# Patient Record
Sex: Male | Born: 1950
Health system: Southern US, Community
[De-identification: ages and names within clinical notes are randomized; demographics above are authoritative.]

## PROBLEM LIST (undated history)

## (undated) DIAGNOSIS — J189 Pneumonia, unspecified organism: Secondary | ICD-10-CM

## (undated) DIAGNOSIS — C61 Malignant neoplasm of prostate: Secondary | ICD-10-CM

## (undated) DIAGNOSIS — I679 Cerebrovascular disease, unspecified: Secondary | ICD-10-CM

## (undated) DIAGNOSIS — N189 Chronic kidney disease, unspecified: Secondary | ICD-10-CM

## (undated) DIAGNOSIS — E785 Hyperlipidemia, unspecified: Secondary | ICD-10-CM

## (undated) DIAGNOSIS — I96 Gangrene, not elsewhere classified: Secondary | ICD-10-CM

## (undated) DIAGNOSIS — I10 Essential (primary) hypertension: Secondary | ICD-10-CM

## (undated) DIAGNOSIS — E11319 Type 2 diabetes mellitus with unspecified diabetic retinopathy without macular edema: Secondary | ICD-10-CM

## (undated) DIAGNOSIS — R0989 Other specified symptoms and signs involving the circulatory and respiratory systems: Secondary | ICD-10-CM

## (undated) DIAGNOSIS — M1990 Unspecified osteoarthritis, unspecified site: Secondary | ICD-10-CM

## (undated) DIAGNOSIS — I219 Acute myocardial infarction, unspecified: Secondary | ICD-10-CM

## (undated) DIAGNOSIS — G4730 Sleep apnea, unspecified: Secondary | ICD-10-CM

## (undated) HISTORY — DX: Gangrene, not elsewhere classified: I96

## (undated) HISTORY — DX: Type 2 diabetes mellitus with unspecified diabetic retinopathy without macular edema: E11.319

## (undated) HISTORY — DX: Cerebrovascular disease, unspecified: I67.9

## (undated) HISTORY — DX: Essential (primary) hypertension: I10

## (undated) HISTORY — DX: Hyperlipidemia, unspecified: E78.5

## (undated) SURGERY — Surgical Case
Anesthesia: *Unknown

---

## 2001-09-10 ENCOUNTER — Encounter: Payer: Self-pay | Admitting: Ophthalmology

## 2001-09-10 ENCOUNTER — Ambulatory Visit (HOSPITAL_COMMUNITY): Admission: RE | Admit: 2001-09-10 | Discharge: 2001-09-11 | Payer: Self-pay | Admitting: Ophthalmology

## 2002-02-18 ENCOUNTER — Ambulatory Visit (HOSPITAL_COMMUNITY): Admission: RE | Admit: 2002-02-18 | Discharge: 2002-02-19 | Payer: Self-pay | Admitting: Ophthalmology

## 2002-10-30 HISTORY — PX: ABOVE KNEE LEG AMPUTATION: SUR20

## 2003-02-20 ENCOUNTER — Inpatient Hospital Stay (HOSPITAL_COMMUNITY): Admission: AD | Admit: 2003-02-20 | Discharge: 2003-03-18 | Payer: Self-pay | Admitting: *Deleted

## 2003-02-22 ENCOUNTER — Encounter: Payer: Self-pay | Admitting: General Surgery

## 2003-02-23 ENCOUNTER — Encounter: Payer: Self-pay | Admitting: General Surgery

## 2003-03-02 ENCOUNTER — Encounter: Payer: Self-pay | Admitting: General Surgery

## 2003-03-02 ENCOUNTER — Encounter: Admission: RE | Admit: 2003-03-02 | Discharge: 2003-03-02 | Payer: Self-pay | Admitting: General Surgery

## 2003-03-04 ENCOUNTER — Encounter: Payer: Self-pay | Admitting: Surgery

## 2003-03-11 ENCOUNTER — Encounter: Payer: Self-pay | Admitting: Surgery

## 2003-03-12 ENCOUNTER — Encounter (INDEPENDENT_AMBULATORY_CARE_PROVIDER_SITE_OTHER): Payer: Self-pay | Admitting: *Deleted

## 2003-03-16 ENCOUNTER — Encounter: Payer: Self-pay | Admitting: Internal Medicine

## 2003-03-18 ENCOUNTER — Inpatient Hospital Stay (HOSPITAL_COMMUNITY)
Admission: RE | Admit: 2003-03-18 | Discharge: 2003-03-24 | Payer: Self-pay | Admitting: Physical Medicine & Rehabilitation

## 2003-04-02 ENCOUNTER — Encounter: Payer: Self-pay | Admitting: Surgery

## 2003-04-02 ENCOUNTER — Inpatient Hospital Stay (HOSPITAL_COMMUNITY): Admission: RE | Admit: 2003-04-02 | Discharge: 2003-04-10 | Payer: Self-pay | Admitting: Surgery

## 2003-04-06 ENCOUNTER — Encounter: Payer: Self-pay | Admitting: Surgery

## 2003-06-04 ENCOUNTER — Encounter
Admission: RE | Admit: 2003-06-04 | Discharge: 2003-09-02 | Payer: Self-pay | Admitting: Physical Medicine & Rehabilitation

## 2003-08-11 ENCOUNTER — Encounter
Admission: RE | Admit: 2003-08-11 | Discharge: 2003-11-09 | Payer: Self-pay | Admitting: Physical Medicine & Rehabilitation

## 2006-02-28 ENCOUNTER — Encounter: Payer: Self-pay | Admitting: Surgery

## 2006-08-22 ENCOUNTER — Encounter: Admission: RE | Admit: 2006-08-22 | Discharge: 2006-08-22 | Payer: Self-pay | Admitting: Nephrology

## 2008-04-15 ENCOUNTER — Encounter: Admission: RE | Admit: 2008-04-15 | Discharge: 2008-06-25 | Payer: Self-pay | Admitting: Family Medicine

## 2008-07-02 ENCOUNTER — Ambulatory Visit: Payer: Self-pay | Admitting: Cardiology

## 2008-07-10 ENCOUNTER — Ambulatory Visit: Payer: Self-pay

## 2008-07-16 ENCOUNTER — Ambulatory Visit: Payer: Self-pay

## 2008-08-07 ENCOUNTER — Ambulatory Visit (HOSPITAL_BASED_OUTPATIENT_CLINIC_OR_DEPARTMENT_OTHER): Admission: RE | Admit: 2008-08-07 | Discharge: 2008-08-07 | Payer: Self-pay | Admitting: Cardiology

## 2008-08-19 ENCOUNTER — Ambulatory Visit: Payer: Self-pay | Admitting: Cardiology

## 2008-08-22 ENCOUNTER — Ambulatory Visit: Payer: Self-pay | Admitting: Pulmonary Disease

## 2009-04-15 ENCOUNTER — Encounter (INDEPENDENT_AMBULATORY_CARE_PROVIDER_SITE_OTHER): Payer: Self-pay | Admitting: *Deleted

## 2009-10-30 HISTORY — PX: COLONOSCOPY: SHX174

## 2010-06-29 ENCOUNTER — Encounter (INDEPENDENT_AMBULATORY_CARE_PROVIDER_SITE_OTHER): Payer: Self-pay | Admitting: *Deleted

## 2010-07-01 ENCOUNTER — Encounter (INDEPENDENT_AMBULATORY_CARE_PROVIDER_SITE_OTHER): Payer: Self-pay

## 2010-07-05 ENCOUNTER — Encounter (INDEPENDENT_AMBULATORY_CARE_PROVIDER_SITE_OTHER): Payer: Self-pay

## 2010-07-05 ENCOUNTER — Ambulatory Visit: Payer: Self-pay | Admitting: Gastroenterology

## 2010-07-06 ENCOUNTER — Telehealth (INDEPENDENT_AMBULATORY_CARE_PROVIDER_SITE_OTHER): Payer: Self-pay | Admitting: *Deleted

## 2010-07-15 ENCOUNTER — Ambulatory Visit: Payer: Self-pay | Admitting: Gastroenterology

## 2010-07-19 ENCOUNTER — Encounter: Payer: Self-pay | Admitting: Gastroenterology

## 2010-08-11 DIAGNOSIS — E782 Mixed hyperlipidemia: Secondary | ICD-10-CM | POA: Insufficient documentation

## 2010-08-11 DIAGNOSIS — E1159 Type 2 diabetes mellitus with other circulatory complications: Secondary | ICD-10-CM

## 2010-08-11 DIAGNOSIS — I10 Essential (primary) hypertension: Secondary | ICD-10-CM | POA: Insufficient documentation

## 2010-08-17 ENCOUNTER — Ambulatory Visit: Payer: Self-pay | Admitting: Cardiology

## 2010-08-17 DIAGNOSIS — E66811 Obesity, class 1: Secondary | ICD-10-CM | POA: Insufficient documentation

## 2010-08-17 DIAGNOSIS — E669 Obesity, unspecified: Secondary | ICD-10-CM | POA: Insufficient documentation

## 2010-08-17 DIAGNOSIS — R55 Syncope and collapse: Secondary | ICD-10-CM

## 2010-12-01 NOTE — Letter (Signed)
Summary: Pre Visit Letter Revised  [facility]  [place], [place] [address]   Phone: [phone]  Fax: [phone]        06/29/2010 MRN: [id number] [name] [place], [place]  [address]             Procedure Date:  07-15-10   Welcome to the Gastroenterology Division at [facility].    You are scheduled to see a nurse for your pre-procedure visit on 07-05-10 at 8:00a.m. on the 3rd floor at [facility], [place] [employer].  We ask that you try to arrive at our office 15 minutes prior to your appointment time to allow for check-in.  Please take a minute to review the attached form.  If you answer "Yes" to one or more of the questions on the first page, we ask that you call the person listed at your earliest opportunity.  If you answer "No" to all of the questions, please complete the rest of the form and bring it to your appointment.    Your nurse visit will consist of discussing your medical and surgical history, your immediate family medical history, and your medications.   If you are unable to list all of your medications on the form, please bring the medication bottles to your appointment and we will list them.  We will need to be aware of both prescribed and over the counter drugs.  We will need to know exact dosage information as well.    Please be prepared to read and sign documents such as consent forms, a financial agreement, and acknowledgement forms.  If necessary, and with your consent, a friend or relative is welcome to sit-in on the nurse visit with you.  Please bring your insurance card so that we may make a copy of it.  If your insurance requires a referral to see a specialist, please bring your referral form from your primary care physician.  No co-pay is required for this nurse visit.     If you cannot keep your appointment, please call [phone] to cancel or reschedule prior to your appointment date.  This allows [place] the  opportunity to schedule an appointment for another patient in need of care.    Thank you for choosing [facility] for your medical needs.  We appreciate the opportunity to care for you.  Please visit [place] at our website  to learn more about our practice.  Sincerely, The Gastroenterology Division

## 2010-12-01 NOTE — Letter (Signed)
Summary: Diabetic Instructions  [facility]  [place], [place] [address]   Phone: [phone]  Fax: [phone]    CHAMBERLAIN MARKSON [birth year] MRN: [id number]   _  _   ORAL DIABETIC MEDICATION INSTRUCTIONS  The day before your procedure:   Take your diabetic pill as you do normally  The day of your procedure:   Do not take your diabetic pill    We will check your blood sugar levels during the admission process and again in Recovery before discharging you home  ________________________________________________________________________  _  _   INSULIN (LONG ACTING) MEDICATION INSTRUCTIONS (Lantus, NPH, 70/30, Humulin, Novolin-N)   The day before your procedure:   Take  your regular evening dose    The day of your procedure:   Do not take your morning dose    _  _   INSULIN (SHORT ACTING) MEDICATION INSTRUCTIONS (Regular, Humulog, Novolog)   The day before your procedure:   Do not take your evening dose   The day of your procedure:   Do not take your morning dose   _  _   INSULIN PUMP MEDICATION INSTRUCTIONS  We will contact the physician managing your diabetic care for written dosage instructions for the day before your procedure and the day of your procedure.  Once we have received the instructions, we will contact you.

## 2010-12-01 NOTE — Letter (Signed)
Summary: Patient Notice- Polyp Results  [facility]  [address] [place], [place] [address]   Phone: [phone]  Fax: [phone]        July 19, 2010 MRN: [id number]    [name] [place], [place]  [address]    Dear [name],  I am pleased to inform you that the colon polyp(s) removed during your recent colonoscopy was (were) found to be benign (no cancer detected) upon pathologic examination.  I recommend you have a repeat colonoscopy examination in 5_ years to look for recurrent polyps, as having colon polyps increases your risk for having recurrent polyps or even colon cancer in the future.  Should you develop new or worsening symptoms of abdominal pain, bowel habit changes or bleeding from the rectum or bowels, please schedule an evaluation with either your primary care physician or with me.  Additional information/recommendations:  __ No further action with gastroenterology is needed at this time. Please      follow-up with your primary care physician for your other healthcare      needs.  __ Please call [phone] to schedule a return visit to review your      situation.  __ Please keep your follow-up visit as already scheduled.  _x_ Continue treatment plan as outlined the day of your exam.  Please call us if you are having persistent problems or have questions about your condition that have not been fully answered at this time.  Sincerely,  Inda Castle MD  This letter has been electronically signed by your physician.  Appended Document: Patient Notice- Polyp Results letter mailed

## 2010-12-01 NOTE — Letter (Signed)
Summary: [facility] Instructions  [facility]  [place], [place] [address]   Phone: [phone]  Fax: [phone]       [name]    07/25/1951    MRN: [id number]        Procedure Day /Date: Friday 07/15/2010     Arrival Time: 7:30 am      Procedure Time: 8:30 am     Location of Procedure:                    _ x_  [place] (4th Floor)                        [place]   Starting 5 days prior to your procedure Sunday 9/11  do not eat nuts, seeds, popcorn, corn, beans, peas,  salads, or any raw vegetables.  Do not take any fiber supplements (e.g. Metamucil, Citrucel, and Benefiber).  THE DAY BEFORE YOUR PROCEDURE         DATE: Thursday 9/15  1.  Drink clear liquids the entire day-NO SOLID FOOD  2.  Do not drink anything colored red or purple.  Avoid juices with pulp.  No orange juice.  3.  Drink at least 64 oz. (8 glasses) of fluid/clear liquids during the day to prevent dehydration and help the prep work efficiently.  CLEAR LIQUIDS INCLUDE: Water Jello Ice Popsicles Tea (sugar ok, no milk/cream) Powdered fruit flavored drinks Coffee (sugar ok, no milk/cream) Gatorade Juice: apple, white grape, white cranberry  Lemonade Clear bullion, consomm, broth Carbonated beverages (any kind) Strained chicken noodle soup Hard Candy                             4.  In the morning, mix first dose of MoviPrep solution:    Empty 1 Pouch A and 1 Pouch B into the disposable container    Add lukewarm drinking water to the top line of the container. Mix to dissolve    Refrigerate (mixed solution should be used within 24 hrs)  5.  Begin drinking the prep at 5:00 p.m. The MoviPrep container is divided by 4 marks.   Every 15 minutes drink the solution down to the next mark (approximately 8 oz) until the full liter is complete.   6.  Follow completed prep with 16 oz of clear liquid of your choice (Nothing red  or purple).  Continue to drink clear liquids until bedtime.  7.  Before going to bed, mix second dose of MoviPrep solution:    Empty 1 Pouch A and 1 Pouch B into the disposable container    Add lukewarm drinking water to the top line of the container. Mix to dissolve    Refrigerate  THE DAY OF YOUR PROCEDURE      DATE: Friday 9/16  Beginning at 3:30 a.m. (5 hours before procedure):         1. Every 15 minutes, drink the solution down to the next mark (approx 8 oz) until the full liter is complete.  2. Follow completed prep with 16 oz. of clear liquid of your choice.    3. You may drink clear liquids until 6:30 am (2 HOURS BEFORE PROCEDURE).   MEDICATION INSTRUCTIONS  Unless otherwise instructed, you should take regular prescription medications with a small sip of water   as early as possible the morning of  your procedure.  Diabetic patients - see separate instructions.  Additional medication instructions: Do not take Furosemide am of procedure.         OTHER INSTRUCTIONS  You will need a responsible adult at least 59 years of age to accompany you and drive you home.   This person must remain in the waiting room during your procedure.  Wear loose fitting clothing that is easily removed.  Leave jewelry and other valuables at home.  However, you may wish to bring a book to read or  an iPod/MP3 player to listen to music as you wait for your procedure to start.  Remove all body piercing jewelry and leave at home.  Total time from sign-in until discharge is approximately 2-3 hours.  You should go home directly after your procedure and rest.  You can resume normal activities the  day after your procedure.  The day of your procedure you should not:   Drive   Make legal decisions   Operate machinery   Drink alcohol   Return to work  You will receive specific instructions about eating, activities and medications before you leave.    The above instructions have  been reviewed and explained to me by   Cornelia Copa RN  July 05, 2010 7:58 am    I fully understand and can verbalize these instructions _____________________________ Date _________

## 2010-12-01 NOTE — Progress Notes (Signed)
Summary: speak to nurse  Phone Note Call from Patient Call back at Home Phone [phone]   Caller: Patient Call For: Dr Deatra Ina Reason for Call: Talk to Nurse Summary of Call: Patient wants to speak to pv nurse Initial call taken by: Ronalee Red,  July 06, 2010 4:07 PM  Follow-up for Phone Call        Pt was asked to call Pv to talke about taking Pletal.  Told pt it was okay for him to continue Pletal. Follow-up by: Ulice Dash RN,  July 06, 2010 4:24 PM

## 2010-12-01 NOTE — Procedures (Signed)
Summary: Colonoscopy  Patient: [name] Note: All result statuses are Final unless otherwise noted.  Tests: (1) Colonoscopy (COL)   COL Colonoscopy           [facility]     [place], [place]  [address]           COLONOSCOPY PROCEDURE REPORT           PATIENT:  Tom, Bradshaw  MR#:  [id number]     BIRTHDATE:  [birth year], 46 yrs. old  GENDER:  male           ENDOSCOPIST:  [name]. Deatra Ina, MD     Referred by:           PROCEDURE DATE:  07/15/2010     PROCEDURE:  Colonoscopy with snare polypectomy     ASA CLASS:  Class II     INDICATIONS:  1) Routine Risk Screening           MEDICATIONS:   Fentanyl 75 mcg IV, Versed 7 mg IV           DESCRIPTION OF PROCEDURE:   After the risks benefits and     alternatives of the procedure were thoroughly explained, informed     consent was obtained.  Digital rectal exam was performed and     revealed no abnormalities.   The LB [id number] endoscope     was introduced through the anus and advanced to the cecum, which     was identified by both the appendix and ileocecal valve, without     limitations.  The quality of the prep was good, using N[place]tley.     The instrument was then slowly withdrawn as the colon was fully     examined.     <<PROCEDUREIMAGES>>           FINDINGS:  A sessile polyp was found in the descending colon. It     was 2 - 3 mm in size. Polyp was snared without cautery. Retrieval     was successful (see image13). snare polyp  A sessile polyp was     found in the sigmoid colon. It was 2 mm in size. It was found 20     cm from the point of entry. Polyp was snared without cautery.     Retrieval was successful (see image15). snare polyp  This was     otherwise a normal examination of the colon (see image1, image2,     image3, image5, image6, image8, image12, and image17).     Retroflexed views in the rectum revealed no abnormalities.    The     time to cecum =  1.50  minutes. The scope  was then withdrawn (time     =  12.25  min) from the patient and the procedure completed.           COMPLICATIONS:  None           ENDOSCOPIC IMPRESSION:     1) 2 - 3 mm sessile polyp in the descending colon     2) 2 mm sessile polyp in the sigmoid colon     3) Otherwise normal examination     RECOMMENDATIONS:     1) If the polyp(s) removed today are proven to be adenomatous     (pre-cancerous) polyps, you will need a repeat colonoscopy in 5     years. Otherwise you should continue  to follow colorectal cancer     screening guidelines for "routine risk" patients with colonoscopy     in 10 years.           REPEAT EXAM:   You will receive a letter from Dr. Deatra Bradshaw in 1-2     weeks, after reviewing the final pathology, with followup     recommendations.           ______________________________     [name] Deatra Ina, MD           CC: Tom Eon, NP           n.     Tom MaisSandy Bradshaw. Kaplan at 07/15/2010 09:25 AM           Page 2 of 3   [place], [id number]  Note: An exclamation mark (!) indicates a result that was not dispersed into the flowsheet. Document Creation Date: 07/15/2010 9:25 AM _______________________________________________________________________  (1) Order result status: Final Collection or observation date-time: 07/15/2010 09:17 Requested date-time:  Receipt date-time:  Reported date-time:  Referring Physician:   Ordering Physician: [name] [phone]) Specimen Source:  Source: [name] Order Number: [id number] Lab site:   Appended Document: Colonoscopy     Procedures Next Due Date:    Colonoscopy: 07/2015

## 2010-12-01 NOTE — Assessment & Plan Note (Signed)
Summary: [facility]   Visit Type:  Follow-up Primary Provider:  Mercie Eon, NP  CC:  syncope.  History of Present Illness: The patient presents for followup of syncope and multiple cardiovascular risk factors. His syncopal episode was in 2009. He had a stress perfusion study demonstrating a probable inferior wall motion abnormality with a mildly reduced ejection fraction. He remained with risk reduction. No further plan. Since that time he has had no further syncopal episodes, presyncope or palpitations. He denies any chest pressure, neck or arm discomfort. He denies any significant shortness of breath, PND or orthopnea. He is not particularly active though he does work up perspiration when he presents  Current Medications (verified): 1)  Humulin 70/30 70-30 % Susp (Insulin Isophane & Regular) .... As Directed 2)  Calcium 600 1500 Mg Tabs (Calcium Carbonate) .... 2 By Mouth Daily 3)  Aspirin 81 Mg  Tabs (Aspirin) .Marland Kitchen.. 1 By Mouth Daily 4)  Furosemide 80 Mg Tabs (Furosemide) .Marland Kitchen.. 1 By Mouth Daily 5)  Lisinopril 20 Mg Tabs (Lisinopril) .Marland Kitchen.. 1 By Mouth Daily 6)  Atenolol 50 Mg Tabs (Atenolol) .Marland Kitchen.. 1 By Mouth Daily 7)  Amlodipine Besylate 5 Mg Tabs (Amlodipine Besylate) .Marland Kitchen.. 1 By Mouth Daily 8)  Crestor 20 Mg Tabs (Rosuvastatin Calcium) .Marland Kitchen.. 1 By Mouth Daily 9)  Cilostazol 100 Mg Tabs (Cilostazol) .Marland Kitchen.. 1 By Mouth Two Times A Day 10)  Cozaar 100 Mg Tabs (Losartan Potassium) .Marland Kitchen.. 1 By Mouth Daily  Allergies (verified): 1)  ! * Ambien  Past History:  Past Medical History:  Pneumonia, diabetes mellitus x19 years,   hyperlipidemia x6 years, hypertension, positive sickle cell trait, eye   surgery, circumcision, left lower extremity amputation secondary to   nonhealing ulcer, apparent osteomyelitis, syncope (unclear etiology)  Review of Systems       As stated in the HPI and negative for all other systems.   Vital Signs:  Patient profile:   60 year old male Height:      72  inches Weight:      320 pounds BMI:     43.56 Pulse rate:   70 / minute Resp:     18 per minute BP sitting:   134 / 70  (right arm)  Vitals Entered By: Levora Angel, CNA (August 17, 2010 2:09 PM)  Physical Exam  General:  Well developed, well nourished, in no acute distress. Head:  normocephalic and atraumatic Neck:  Neck supple, no JVD. No masses, thyromegaly or abnormal cervical nodes. Chest Wall:  no deformities or breast masses noted Lungs:  Clear bilaterally to auscultation and percussion. Abdomen:  Bowel sounds positive; abdomen soft and non-tender without masses, organomegaly, or hernias noted. No hepatosplenomegaly, morbidly obese Msk:  Back normal, normal gait. Muscle strength and tone normal.joint redness.   Extremities:  Status post left AKA Neurologic:  Alert and oriented x 3. Skin:  Intact without lesions or rashes. Cervical Nodes:  no significant adenopathy Psych:  Normal affect.   Detailed Cardiovascular Exam  Neck    Carotids: Carotids full and equal bilaterally without bruits.      Neck Veins: Normal, no JVD.    Heart    Inspection: no deformities or lifts noted.      Palpation: normal PMI with no thrills palpable.      Auscultation: regular rate and rhythm, S1, S2 without murmurs, rubs, gallops, or clicks.    Vascular    Abdominal Aorta: no palpable masses, pulsations, or audible bruits.  Femoral Pulses: normal femoral pulses bilaterally.      Pedal Pulses: diminished right posterior tibial pulse and diminished left posterior tibial pulse.      Radial Pulses: normal radial pulses bilaterally.     EKG  Procedure date:  08/17/2010  Findings:      Sinus rhythm, rate 78, axis within normal limits, intervals within normal limits, no acute ST-T wave changes  Impression & Recommendations:  Problem # 1:  SYNCOPE (ICD-780.2) The patient had a syncopal episode for unresponsive episode 2009 but none since then.  At this time no further work up is  indicated.  Problem # 2:  CARDIOVASCULAR FUNCTION STUDY, ABNORMAL (ICD-794.30) He did have a mildly abnormal perfusion study. He has significant risk factors. At this point I will probably screen him with followup studies in the future or sooner if he has any symptoms.  He needs aggressive risk reduction.  Problem # 3:  HYPERLIPIDEMIA (ICD-272.4) I reviewed his most recent lipid profile since he had an acceptable ratio. He will continue meds as listed. Orders: EKG w/ Interpretation ([address])  Problem # 4:  OBESITY, UNSPECIFIED (ICD-278.00) He understands the need to lose weight with his morbid obesity.  Patient Instructions: 1)  Your physician recommends that you schedule a follow-up appointment in: 12 months 2)  Your physician recommends that you continue on your current medications as directed. Please refer to the Current Medication list given to you today.

## 2010-12-01 NOTE — Miscellaneous (Signed)
Summary: [facility] previsit  Clinical Lists Changes  Medications: Added new medication of MOVIPREP 100 GM  SOLR (PEG-KCL-NACL-NASULF-NA ASC-C) As per prep instructions. - Signed Rx of MOVIPREP 100 GM  SOLR (PEG-KCL-NACL-NASULF-NA ASC-C) As per prep instructions.;  #1 x 0;  Signed;  Entered by: Cornelia Copa RN;  Authorized by: Inda Castle MD;  Method used: Electronically to [name] [address], [place], [place], [place]  [address], Ph: [phone] or [phone], Fax: [phone] Allergies: Added new allergy or adverse reaction of * AMBIEN Observations: Added new observation of NKA: F (07/05/2010 7:37)    Prescriptions: MOVIPREP 100 GM  SOLR (PEG-KCL-NACL-NASULF-NA ASC-C) As per prep instructions.  #1 x 0   Entered by:   Cornelia Copa RN   Authorized by:   Inda Castle MD   Signed by:   Cornelia Copa RN on 07/05/2010   Method used:   Electronically to        [id number] (retail)       [address]       [place],   [address]       Ph: [phone] or [phone]       Fax: [phone]   RxID:   [id number]   Appended Document: Lec previsit  Dr Deatra Ina, I left a  message for pt regarding the medication Cilostazol (Pletal) 100mg  Bid . During previsit today, asked  pt why he was taking Pletal, but he did not know why. He had an MI in 2010, but no record of seeing Dr Percival Spanish since 2009.Do we need to schedule an office visit  with you or do you want the pt to stay on the Pletal. He is scheduled for a colonoscopy with you on 07/15/10.Please advise!

## 2011-01-12 LAB — GLUCOSE, CAPILLARY
Glucose-Capillary: 107 mg/dL — ABNORMAL HIGH (ref 70–99)
Glucose-Capillary: 78 mg/dL (ref 70–99)

## 2011-03-14 NOTE — Assessment & Plan Note (Signed)
[facility]                            CARDIOLOGY OFFICE NOTE   NAME:[name]                          MRN:          [id number]  DATE:08/19/2008                            DOB:          1951-04-26    PRIMARY CARE PHYSICIAN:  Mercie Eon, NP   REASON FOR PRESENTATION:  Evaluate the patient with questionable  syncope.   HISTORY OF PRESENT ILLNESS:  The patient is a pleasant 60 year old  gentleman with an episode of unresponsiveness as outlined extensively in  the July 02, 2008, note.  As a followup of that, I did send him for  a stress perfusion study, which suggested an old inferior infarct with  no peri-infarct ischemia.  His ejection fraction was 50%.  He did also  wear an event monitor, which demonstrated normal sinus rhythm.  There  were no transmitted events in 30 days.  Finally, he had a sleep study.  I do not have the interpretation of this.  However, I did get the report  and appears to be sleep apnea.   The patient says he has had no further syncopal episodes.  He has had no  presyncope.  He has had no chest discomfort, neck or arm discomfort.  He  has not had any palpitation.  He has had no shortness breath, PND, or  orthopnea.   PAST MEDICAL HISTORY:  Pneumonia, diabetes mellitus x19 years,  hyperlipidemia x6 years, hypertension, positive sickle cell trait, eye  surgery, circumcision, left lower extremity amputation secondary to  nonhealing ulcer, apparent osteomyelitis.   ALLERGIES AND INTOLERANCES:  AMBIEN.   MEDICATIONS:  1. Insulin.  2. Aspirin 325 mg daily.  3. Cozaar 100 mg daily.  4. Amlodipine 10 mg daily.  5. Lisinopril 40 mg daily.  6. Cilostazol 200 mg daily.  7. Avandia 4 mg daily.  8. Lasix 80 mg daily.  9. Atenolol 100 mg daily.  10.Vytorin 10/80.   REVIEW OF SYSTEMS:  As stated in the HPI and otherwise negative for  other systems.   PHYSICAL EXAMINATION:  GENERAL:  The patient is in no distress.  VITAL SIGNS:  Blood pressure 120/64, heart rate 80 and regular, weight  321 pounds, body mass index 43.  HEENT:  Eyes unremarkable.  Pupils equal, round, and reactive to light.  Fundi not visualized.  Oral mucosa unremarkable.  NECK:  No jugular venous distention at 45 degrees.  Carotid upstroke  brisk and symmetrical.  No bruits.  No thyromegaly.  LYMPHATICS:  No  cervical, axillary, or inguinal adenopathy.  LUNGS:  Clear to auscultation bilaterally.  BACK:  No costovertebral angle tenderness.  CHEST:  Unremarkable.  HEART:  PMI not displaced or sustained.  S1, S2 within normal limits.  No S3, no S4.  No clicks, no rubs, no murmurs.  ABDOMEN:  Morbidly  obese, positive bowel sounds normal in frequency and pitch.  No bruits,  no rebound, no guarding.  No midline pulsatile mass.  No hepatomegaly,  no splenomegaly.  SKIN:  No rashes, no nodules.  EXTREMITIES:  2+ pulses throughout.  No edema, no  cyanosis, no clubbing.  NEUROLOGICAL:  Oriented to person, place, and time.  Cranial nerves II  through XII grossly intact.  Motor grossly intact throughout.   ASSESSMENT AND PLAN:  1. Syncope.  The patient has had no further episodes of this.  I      wonder if this was somehow related to the sleep apnea.  I do not      have any evidence of dysrhythmia.  Without recurrences, I do not      think further evaluation is warranted.  However, if he has any      recurrent episodes, I need to see him back and could consider a      loop monitor.  2. Abnormal Cardiolite.  The patient may have had an old inferior      infarct.  I will assume this.  However, there is no evidence of      high-grade obstructive disease elsewhere.  He has only a mildly      reduced ejection fraction.  At this point, I think we need to      pursue aggressive risk reduction and I have reviewed this strategy      with the patient and his wife.  If there is any future symptoms      such as shortness of breath or chest discomfort,  then I will pursue      cardiac catheterization.  3. Sleep apnea.  The patient seems to have this on his study and I      will order a sleep test or I will order a sleep apnea MD      appointment.  4. Diabetes.  Given the above abnormal Cardiolite, he needs continued      aggressive management with his diabetes and I reviewed this with      him  5. Dyslipidemia per Ms. Steadman.  A goal being LDL less than 100 and      HDL greater than 40.  He has actually achieved both of these by the      last reading.  6. Obesity.  He understands the need to lose weight with diet and      exercise.  7. Followup.  I would like to see him back in 6 months or sooner if      needed.     Minus Breeding, MD, [facility]  Electronically Signed    JH/MedQ  DD: 08/19/2008  DT: 08/19/2008  Job #: [id number]   cc:   Mercie Eon, NP

## 2011-03-14 NOTE — Assessment & Plan Note (Signed)
[facility]                            CARDIOLOGY OFFICE NOTE   NAME:[name], [name]                          MRN:          [id number]  DATE:07/02/2008                            DOB:          1951-02-09    REFERRING:  Ramond Marrow. Quita Skye, Nurse Practitioner.   REASON FOR REFERRAL:  Evaluate the patient with syncope and multiple  cardiovascular risk factors.   HISTORY OF PRESENT ILLNESS:  The patient is a pleasant 60 year old  African American gentleman without a prior cardiac history, though he  has had long-standing cardiovascular risk factors.  He stated he has had  2 episodes of syncope.  One was in February.  He was sitting on a couch,  drinking.  His wife stated he stopped responding.  His eyes seemed to  roll back a little bit.  He apparently lost consciousness for 5 minutes.  There was no seizure activity.  There was no bowel or bladder  incontinence.  When he came to, he knew where he was.  He had not had  any palpitations.  He had no prodrome.  He did not have any other event  until about 3 weeks ago.  He was coming from church.  He stated he  thought the sun was very hot that afternoon and felt that it was more  intense than usual.  He went to get out of his car and actually loss  consciousness, going down to the ground.  His wife stated he was out  just very briefly and came to.  He knew where he was.  Again, there was  no seizure activity, loss of bowel or bladder.  He had had no prodrome  other than the sense of the sun being hot.  He had had no palpitations.   Occasionally, the patient will get some lightheadedness upon standing,  although this is reproducible.  Somewhat limited in his activities, as  he has had lower extremity amputation.  He denies with his walking with  a walker that he can do, any chest discomfort, neck discomfort, arm  discomfort, activity-induced nausea, vomiting, or excessive diaphoresis.  He has had no shortness of  breath, and denies any PND or orthopnea.   PAST MEDICAL HISTORY:  1. Pneumonia.  2. Diabetes mellitus x19 years.  3. Hyperlipidemia x6 years.  4. Hypertension.  5. Positive sickle cell trait.   PAST SURGICAL HISTORY:  1. Eye surgery.  2. Circumcision.  3. Left lower extremity amputation secondary to nonhealing ulcer.  4. Apparent osteomyelitis.   ALLERGIES/INTOLERANCES:  AMBIEN.   MEDICATIONS:  1. Insulin.  2. Aspirin daily.  3. Cozaar 50 mg twice a day.  4. Amlodipine daily.  5. Lisinopril 40 mg daily.  6. Cilostazol 100 mg b.i.d.  7. Avandia 4 mg daily.  8. Furosemide 80 mg daily.  9. Atenolol 100 mg daily.  10.Vytorin 10/80 daily.   SOCIAL HISTORY:  The patient is married.  He has 2 daughters.  He has  never smoked cigarettes and does not drink alcohol.  He is a Environmental education officer.   FAMILY HISTORY:  Contributory for his mother dying of diabetes  complications at age 60 with a stroke and MI.  His daughter has sickle  cell disease.   REVIEW OF SYSTEMS:  Positive for snoring, headaches, daytime somnolence,  mild right ankle edema.  Otherwise, negative for all other systems.   PHYSICAL EXAMINATION:  GENERAL:  The patient is very pleasant and in no  distress.  VITAL SIGNS:  Blood pressure 107/69, heart rate 64 and regular, and  weight 227 pounds.  HEENT:  Eyelids unremarkable; pupils equal, round, and reactive to  light; fundi, multiple laser surgical scars; oral mucosa unremarkable.  NECK:  No jugular venous distention at 45 degrees; carotid upstroke  brisk and symmetric; no bruits, no thyromegaly.  LYMPHATICS:  No cervical, axillary, or inguinal adenopathy.  LUNGS:  Clear to auscultation bilaterally.  BACK:  No costovertebral angle tenderness.  CHEST:  Unremarkable.  HEART:  PMI not displaced or sustained; S1 and S2 within normal limits;  no S3, no S4; no clicks, no rubs, no murmurs.  ABDOMEN:  Morbidly obese; positive bowel sounds, normal in frequency and  pitch; no  rebound, no guarding; unable to appreciate midline pulsatile  mass, splenomegaly, or hepatomegaly; there is no palpable liver edge or  expanded liver with percussion.  SKIN:  No rashes, no nodules.  EXTREMITIES:  2+ pulses throughout, absent dorsalis pedis and posterior  tibialis on the right; status post amputation on the left.  NEURO:  Oriented to person, place, and time; cranial nerves II through  XII grossly intact; motor grossly intact.   EKG sinus rhythm, rate 64, axis within normal limits, intervals within  normal limits, and no acute ST-T wave changes.   ASSESSMENT AND PLAN:  1. Syncope.  The etiology of the patient's syncope is not clear.  He      has an event monitor on, which he has been wearing for 3 weeks.  We      tried to see if there has been any transmitted strips, though he      has not activated it.  He has had no events while wearing it.  I am      strongly suspecting dysrhythmia, though this is a reasonable first      step in analysis.  The second episode he had may have been      orthostasis, but the first clearly was not.  Further evaluation      based on future symptoms and the results of the monitor.  Of note,      he is to see a neurologist soon.  2. Diabetes.  The patient has long-standing diabetes.  He has got a      very low functional level.  He needs an exercise stress test, but      he would need to walk on a treadmill.  Therefore, he will have an      adenosine Cardiolite.  I think, it is unlikely that he might have      an ischemic etiology to his events.  3. Sleep apnea.  The patient almost has definitely has sleep apnea.      He stops breathing his wife says.  He snores loudly.  For his other      symptoms, he will get a sleep study.  4. Hypertension.  Blood pressure is well controlled.  He will continue      the medications as listed.  5. Dyslipidemia, followed by Mercie Eon with a goal LDL less than  100 and HDL greater than 40.  6.  Obesity.  He understands the need to lose weight with diet and      exercise.  7. Followup.  We will see the patient back after the results of his      sleep study, stress test, and the event monitor.     Minus Breeding, MD, [facility]  Electronically Signed    JH/MedQ  DD: 07/02/2008  DT: 07/03/2008  Job #: [id number]   cc:   Ramond Marrow. Quita Skye, NP

## 2011-03-14 NOTE — Procedures (Signed)
[name], [name] NO.:  [id number]   MEDICAL RECORD NO.:  [id number]          PATIENT TYPE:  OUT   LOCATION:  SLEEP CENTER                 FACILITY:  [facility]   PHYSICIAN:  Kathee Delton, MD,FCCPDATE OF BIRTH:  1951-08-22   DATE OF STUDY:  08/07/2008                            NOCTURNAL POLYSOMNOGRAM   REFERRING PHYSICIAN:  Minus Breeding, MD, [facility]   REFERRING PHYSICIAN:  Minus Breeding, MD, [facility]   INDICATIONS FOR THE STUDY:  Hypersomnia with sleep apnea.   EPWORTH SCORE:  Epworth score is 6.   SLEEP ARCHITECTURE:  The patient had a total sleep time of 309 minutes  with no slow-wave sleep and very small quantities of REM.  Sleep onset  latency was normal, and REM onset was mildly prolonged.  Sleep  efficiency was poor at 78%.   RESPIRATORY DATA:  The patient was found to have 293 obstructive apneas,  37 central apneas, and 46 hypopneas for a AHI of 73 events per hour. The  events were not positional but they were clearly worse during REM.  The  patient did not undergo split-night criteria secondary to the sleep  technician determining there was not enough total sleep time before 2  a.m.  Very loud snoring was noted throughout.   OXYGEN DATA:  The patient was found to have oxygen saturation as low as  61% with his obstructive events.   CARDIAC DATA:  No clinically significant arrhythmias were noted.   MOVEMENT/PARASOMNIA:  The patient was found to have no significant leg  jerks or abnormal behaviors.   IMPRESSION/RECOMMENDATION:  1. Severe obstructive sleep apnea/hypopnea syndrome with an apnea-      hypopnea index of 73 events per hour and O2 desaturation as low as      61%.  Treatment for this degree of sleep apnea should focus      primarily on weight loss as well as continuous positive airway      pressure.     Kathee Delton, MD,FCCP  Diplomate, [place] Board of Sleep  Medicine  Electronically Signed    [facility]/MEDQ  D:  08/22/2008  16:59:54  T:  08/23/2008 03:48:45  Job:  [id number]

## 2011-03-17 NOTE — Op Note (Signed)
NAMEKARRY, GAEBLER                               ACCOUNT NO.:  [id number]   MEDICAL RECORD NO.:  [id number]                   PATIENT TYPE:  INP   LOCATION:  [id number]                                 FACILITY:  [facility]   PHYSICIAN:  Epifania Gore. Nils Pyle, M.D.             DATE OF BIRTH:  1951-01-23   DATE OF PROCEDURE:  04/03/2003  DATE OF DISCHARGE:                                 OPERATIVE REPORT   PREOPERATIVE DIAGNOSIS:  Nonhealing below-the-knee amputation stump.   POSTOPERATIVE DIAGNOSIS:  Nonhealing below-the-knee amputation stump.   OPERATION:  Revision of the below-the-knee amputation stump to an above-the  knee amputation.   SURGEON:  Epifania Gore. Nils Pyle, M.D.   INDICATIONS:  Mr. Luba is a 60 year old hypertensive diabetic who underwent  below-the-knee amputation for progressive suppurative changes in his left  lower extremity.  The postoperative recovery has been complicated by  nonhealing with liquefaction necrosis.  We have recommended that the patient  undergo a revision of the below-the-knee amputation with a possible  conversion to an above-the-knee amputation.   TECHNIQUE:  The patient was taken to the operating room, placed on the table  in the supine position.  Anesthesia was achieved with good general  endotracheal technique.  The left lower extremity was prepped with Betadine  soap, and painted with Betadine solution, and sterile drapes were applied.  The below-the-knee amputation stump was explored.  The nonviable tissue was  debrided off, and it was obvious that the posterior muscles of gastrocnemius  and remnants of the soleus were nonviable.  We aborted our attempts at  salving the BKA and converted the operative field to an above-the-knee  amputation.  A fishmouth incision was made anteriorly to posteriorly,  extended through the layers with hemorrhage controlled with cautery.  The  femoral artery and vein were divided between [facility] clamps,  The femoral  nerve was  similarly managed.  Periosteum to the femoral shaft was  identified, scored, and elevated.  The oscillating saw was used to transect  the femoral shaft.  Specimen was forwarded to pathology.  The wound was  copiously irrigated.  All hemorrhage was controlled with ligatures of 2-0  Vicryl.  The cut edges of the femoral artery and vein were controlled with 2-  0 Vicryl ties, reinforced suture ligatures of 2-0 Vicryl.  The skin edges  were loosely approximated with #2 Prolene suture.  Sterile compressive  dressing applied.  The patient tolerated the procedure well and was  transferred from the operating room to the recovery room in satisfactory  condition with stable vital signs.                                               Harold A. Nils Pyle, M.D.    Rondel Oh  D:  04/03/2003  T:  04/05/2003  Job:  [id number]

## 2011-03-17 NOTE — H&P (Signed)
NAMEVENCENT, [name]                               ACCOUNT NO.:  [id number]   MEDICAL RECORD NO.:  [id number]                   PATIENT TYPE:  INP   LOCATION:                                       FACILITY:  [place]   PHYSICIAN:  Joneen Boers A. Nils Pyle, M.D.             DATE OF BIRTH:  [birth year]   DATE OF ADMISSION:  04/02/2003  DATE OF DISCHARGE:                                HISTORY & PHYSICAL   ADMITTING DIAGNOSIS:  Non-healing, left, below-the-knee amputation.   HISTORY OF PRESENT ILLNESS:  [name] is a 60 year old hypertensive diabetic  who was initially seen for a __________ wound in his left foot.  Patient  ultimately underwent a below-the-knee amputation, which has been complicated  by non-healing with liquefactive necrosis and drainage.  We have recommended  that he be re-admitted to the hospital with continuation of antibiotics and  prepared for revision.   PAST MEDICAL HISTORY:  Remarkable for having his diabetes.   PAST SURGICAL HISTORY:  He had no surgery.   ALLERGIES:  Reported no allergies.   FAMILY HISTORY:  Positive for hypertension, heart attacks and stroke.   REVIEW OF SYSTEMS:  Specifically negative for angina pectoris.   CURRENT MEDICATIONS:  1. Include aspirin 325 mg a day.  2. Zocor 80 mg a day.  3. Cozaar 50 mg a day.  4. Hydrochlorothiazide 25 mg a day.  5. Novolin insulin on a sliding scale.  6. Glucophage 1 gm b.i.d.  7. Insulin 70/30, 40 units in the morning, 30 units in the evening.  8. He had been on Augmentin 1 gm a day.   PHYSICAL EXAMINATION:  GENERAL:  He has a flat affect.  VITAL SIGNS:  His blood pressure is 96/60, pulse rate of 120; temperature is  98.7.  HEENT:  Clear.  NECK:  Supple.  Trachea is midline.  Thyroid is nonpalpable.  LUNGS:  Clear.  HEART:  The heart sounds are distant.  ABDOMEN:  Soft.  EXTREMITIES:  The extremity exam is remarkable for nonpalpable pulses in the  right foot.  There are no ulcerations in the right foot.   There is trace  edema.  The left lower extremity:  There is a below-the-knee amputation  stump, which is shaggy at its edges and has demonstrable liquefactive  necrosis in the mid portion with drainage.  There is no particular malodor.  There is no ascending cellulitis.  NEUROLOGIC:  The patient is grossly intact with no dominant skin lesions.  There is no regional adenopathy.   LABORATORY DATA:  In progress and will include a CBC, a type and crossmatch.   PLAN:  We plan to revise the below-the-knee amputation.  The patient will  likely will require an above-the-knee revision.  Harold A. Nils Pyle, M.D.    Rondel Oh  D:  04/02/2003  T:  04/02/2003  Job:  [id number]

## 2011-03-17 NOTE — Procedures (Signed)
[name], [name]                               ACCOUNT NO.:  [id number]   MEDICAL RECORD NO.:  [id number]                   PATIENT TYPE:  INP   LOCATION:  [id number]                                 FACILITY:  [facility]   PHYSICIAN:  Odette Fraction, M.D.                 DATE OF BIRTH:  Feb 16, 1951   DATE OF PROCEDURE:  02/25/2003  DATE OF DISCHARGE:                                  ECHOCARDIOGRAM   REFERRING PHYSICIAN:  Dr. Tamala Julian.   PROCEDURE:  Echocardiogram.   INDICATIONS FOR PROCEDURE:  [name] is a 60 year old gentleman with  diabetes mellitus, hypertension, and cellulitis who is referred for  evaluation of left ventricular function status post surgery.   SUMMARY/RESULTS:  The technical quality of the study is reasonable.   The aorta is within normal limits at 3.5 cm.   The left atrium is within normal limits at 3.6 cm. No obvious clots or  masses were appreciated and the patient appeared to be in sinus rhythm  during this procedure.   Interventricular septum was mild to moderately thickened at 1.5 cm while the  posterior wall was within normal limits at 1.1 cm.   The aortic valve appeared grossly structurally normal with good leaflet  excursion. No aortic insufficiency was noted. Doppler integrity of the  aortic valve was within normal limits.   The mitral valve was also reasonably structurally normal. No mitral valve  prolapse was noted. No mitral annular calcification was noted. Leaflet  excursion was normal. No significant mitral regurgitation was noted.   Pulmonic valve was not well visualized.   The tricuspid valve appeared grossly structurally normal with trivial  tricuspid regurgitation noted.   The left ventricle was normal in size with the LVIDD measured at 4.2 cm and  the LVID measured at 3.2 cm. Overall left ventricular systolic function  appeared to be preserved. The inferior wall did appear to be moderate  hypokinetic, as compared to the remaining walls but  overall, ejection  fraction is preserved.   The right atrium and right ventricle appeared normal in size. There was no  evidence on this study for diastolic dysfunction.   IMPRESSION:  1. Mild to moderate asymmetric septal hypertrophy.  2. Trivial tricuspid regurgitation.  3.     Normal left ventricular chamber size with preserved overall ejection     fraction.  4. The inferior wall appeared moderately hypokinetic as compared to the     remaining walls.  5. There is no evidence for diastolic dysfunction.                                               Odette Fraction, M.D.    TB/MEDQ  D:  02/25/2003  T:  02/25/2003  Job:  [id number]   cc:   Merian Capron  [address]  [place]  [place] [address]  Fax: [phone]

## 2011-03-17 NOTE — Consult Note (Signed)
NAMECOLESON, SHOPE                               ACCOUNT NO.:  [id number]   MEDICAL RECORD NO.:  [id number]                   PATIENT TYPE:  INP   LOCATION:  A318                                 FACILITY:  [facility]   PHYSICIAN:  Leane Para C. Tamala Julian, M.D.                DATE OF BIRTH:  [birth year]   DATE OF CONSULTATION:  DATE OF DISCHARGE:                                   CONSULTATION   HISTORY:  A 60 year old male with known diabetes mellitus and hypertension  who presents with less than a week history of new onset of a cut over the  fifth metatarsal of his left foot.  Over the next 72 hours the area became  black and erythematous and the patient developed chills and fever.  He  developed foot and calf pain and noticed that his diabetes was poorly  controlled.  The patient was admitted on 04/23, in the late evening, with  the evidence of early gangrenous changes of the left fifth metatarsal area.  He has been started on Ancef, Flagyl, and Levaquin, but the wife and the  patient state that the erythematous area is expanding.  I have been asked to  consult to determine whether or not the patient needs further surgical  debridement.   PAST MEDICAL HISTORY:  Past history is positive for diabetes mellitus,  hypertension, sickle cell trait, degenerative joint disease, glaucoma, and  obesity.   MEDICATIONS:  Medications are well outlined in the admission note by Dr.  Hillery Jacks.   PHYSICAL EXAMINATION:  GENERAL:  On examination he is a pleasant male who at  time the of evaluation is having some shaking chills.  VITAL SIGNS:  Blood pressure is 160/100; pulse is 110; respirations 18; and  temperature is 100.5.  HEAD AND NECK:  Exam is unremarkable.  LUNGS:  Clear.  HEART:  Regular with tachycardia.  ABDOMEN:  Abdomen is soft.  EXTREMITIES:  Extremity exam reveals decreased pulses bilaterally with  poorly detectable pulses on the left lower extremity.  There is a blackened  area over the lateral  aspect of the fifth metatarsal slightly proximal to  the head of the metatarsal.  There is a blackened area that extends across  the dorsum of the foot and there is an area of erythema that extends halfway  over the dorsum of the foot and extends up to an area a few centimeters from  the bend of the ankle anteriorly and dorsally.  There is tenderness of the  foot to palpation even in the areas that do not have any erythema.  There is  no fluctuance noted and there is no drainage of the ulcer.  There is  tenderness of the calf of the foot and there is slight edema of the foot.  The right foot has a palpable dorsalis pedis and posterior tibial pulse with  no tenderness and minimal  edema.   IMPRESSION:  1. Peripheral vascular disease in combination with diabetes mellitus with     developing gangrenous changes at the metatarsal of the left fifth toe.     He has cellulitis extending from this area.  2. Diabetes mellitus.  3. Hypertension.   RECOMMENDATIONS:  The patient will have the Ancef changed to Zosyn just to  get a more potent antibiotic on board.  This will in no way preclude him  having to have the treatment, however.  I will need to get Doppler studies  just to see what his Doppler pressures are.  It is likely that his pressures  are significantly decreased on the left and may lead to poor healing.  The  patient is started on Pletal and should be continued on this  postoperatively.  It is unlikely that it will offer anything before  debridement is done, however.  He can continue on his aspirin.  Plain film x-  rays will be done to rule out osteomyelitis and rule out air in this  subcutaneous tissue or the joint space.  I tentatively will schedule him for  wide excision of the ischemic area on Tuesday after we have gotten the other  studies done. If his cellulitis appears to be rapidly progressive, I will do  him late tomorrow afternoon.                                                Vernon Prey. Tamala Julian, M.D.    LCS/MEDQ  D:  02/22/2003  T:  02/22/2003  Job:  [id number]   cc:   Vanetta Mulders. Dechurch, M.D.  [address]  [place] [address]  Fax: [place]

## 2011-03-17 NOTE — Group Therapy Note (Signed)
   [name], [name]                               ACCOUNT NO.:  [id number]   MEDICAL RECORD NO.:  [id number]                   PATIENT TYPE:  INP   LOCATION:  A318                                 FACILITY:  [facility]   PHYSICIAN:  Leane Para C. Tamala Julian, M.D.                DATE OF BIRTH:  1950/11/19   DATE OF PROCEDURE:  DATE OF DISCHARGE:                                   PROGRESS NOTE   SUBJECTIVE:  The patient states that he feels poorly.  He had a temperature  elevation last evening to 102.  He continued to have increasing pain in his  left foot.  There is a suggestion that there is progression of the erythema  in the superficial necrosis.  There is a small amount of bloody drainage  from the foot.  He had x-rays of his foot last evening, but the results of  that have not yet returned.  Will follow that up later today.  He will also  have ankle-brachial index with arterial Dopplers today.  The patient is  scheduled to have wide debridement of the area tomorrow, and he and his wife  were informed that we may have to amputate the fifth toe.  The patient will  be continued on his present antibiotic treatment.                                               Vernon Prey. Tamala Julian, M.D.    LCS/MEDQ  D:  02/23/2003  T:  02/23/2003  Job:  [id number]

## 2011-03-17 NOTE — Discharge Summary (Signed)
   [name], [name]                               ACCOUNT NO.:  [id number]   MEDICAL RECORD NO.:  [id number]                   PATIENT TYPE:  INP   LOCATION:                                       FACILITY:  [facility]   PHYSICIAN:  Joneen Boers A. Nils Pyle, M.D.             DATE OF BIRTH:  1950-12-21   DATE OF ADMISSION:  02/20/2003  DATE OF DISCHARGE:  03/18/2003                                 DISCHARGE SUMMARY   ADMITTING DIAGNOSES:  Suppurative gangrene of the left lower extremity.   OPERATION:  Left below the knee amputation.   COMPLICATIONS:  Failure to heal with revision to a left above the knee  amputation.   HISTORY OF PRESENT ILLNESS:  Well documented in the chart.  Briefly, the  patient is a 60 year old diabetic who has been in poor control who presented  with a neurotrophic ulcer due to the NSAID properties of his lower  extremity.  He was started on antibiotics.  This showed no significant  improvement.  He manifested evidence of a septic response.  He was accepted  in transfer from [facility] and underwent an exploration for  possible limb salvage following debridement.  This was unsuccessful.   He subsequently underwent an above the knee amputation with open wound  management.  At the time of this dictation the patient is adequately  convalesced and was transferred to the rehabilitation service for assistance  in making the transition from home in the post amputation state.  The  patient was transferred during this admission to the in-house rehabilitation  service.  He will continue on his admitting medications.  His diabetic  management will be coordinated in-house by Nolene Ebbs, M.D.                                                Harold A. Nils Pyle, M.D.    Rondel Oh  D:  08/12/2003  T:  08/12/2003  Job:  [id number]

## 2011-04-11 ENCOUNTER — Encounter: Payer: Self-pay | Admitting: Nurse Practitioner

## 2012-08-22 ENCOUNTER — Encounter (HOSPITAL_COMMUNITY): Payer: Self-pay

## 2012-08-28 NOTE — Patient Instructions (Addendum)
Your procedure is scheduled on: 09/05/2012  Report to [facility] at   700       AM.  Call this number if you have problems the morning of surgery: [phone]   Do not eat food or drink liquids :After Midnight.      Take these medicines the morning of surgery with A SIP OF WATER: atenolol,lisinopril,cozaar. No diabetic meds the morning of your surgery.   Do not wear jewelry, make-up or nail polish.  Do not wear lotions, powders, or perfumes. You may wear deodorant.  Do not shave 48 hours prior to surgery.  Do not bring valuables to the hospital.  Contacts, dentures or bridgework may not be worn into surgery.  Leave suitcase in the car. After surgery it may be brought to your room.  For patients admitted to the hospital, checkout time is 11:00 AM the day of discharge.   Patients discharged the day of surgery will not be allowed to drive home.  :     Please read over the following fact sheets that you were given: Coughing and Deep Breathing, Surgical Site Infection Prevention, Anesthesia Post-op Instructions and Care and Recovery After Surgery    Cataract A cataract is a clouding of the lens of the eye. When a lens becomes cloudy, vision is reduced based on the degree and nature of the clouding. Many cataracts reduce vision to some degree. Some cataracts make people more near-sighted as they develop. Other cataracts increase glare. Cataracts that are ignored and become worse can sometimes look white. The white color can be seen through the pupil. CAUSES   Aging. However, cataracts may occur at any age, even in newborns.   Certain drugs.   Trauma to the eye.   Certain diseases such as diabetes.   Specific eye diseases such as chronic inflammation inside the eye or a sudden attack of a rare form of glaucoma.   Inherited or acquired medical problems.  SYMPTOMS   Gradual, progressive drop in vision in the affected eye.   Severe, rapid visual loss. This most often happens when trauma  is the cause.  DIAGNOSIS  To detect a cataract, an eye doctor examines the lens. Cataracts are best diagnosed with an exam of the eyes with the pupils enlarged (dilated) by drops.  TREATMENT  For an early cataract, vision may improve by using different eyeglasses or stronger lighting. If that does not help your vision, surgery is the only effective treatment. A cataract needs to be surgically removed when vision loss interferes with your everyday activities, such as driving, reading, or watching TV. A cataract may also have to be removed if it prevents examination or treatment of another eye problem. Surgery removes the cloudy lens and usually replaces it with a substitute lens (intraocular lens, IOL).  At a time when both you and your doctor agree, the cataract will be surgically removed. If you have cataracts in both eyes, only one is usually removed at a time. This allows the operated eye to heal and be out of danger from any possible problems after surgery (such as infection or poor wound healing). In rare cases, a cataract may be doing damage to your eye. In these cases, your caregiver may advise surgical removal right away. The vast majority of people who have cataract surgery have better vision afterward. HOME CARE INSTRUCTIONS  If you are not planning surgery, you may be asked to do the following:  Use different eyeglasses.   Use stronger or  brighter lighting.   Ask your eye doctor about reducing your medicine dose or changing medicines if it is thought that a medicine caused your cataract. Changing medicines does not make the cataract go away on its own.   Become familiar with your surroundings. Poor vision can lead to injury. Avoid bumping into things on the affected side. You are at a higher risk for tripping or falling.   Exercise extreme care when driving or operating machinery.   Wear sunglasses if you are sensitive to bright light or experiencing problems with glare.  SEEK  IMMEDIATE MEDICAL CARE IF:   You have a worsening or sudden vision loss.   You notice redness, swelling, or increasing pain in the eye.   You have a fever.  Document Released: 10/16/2005 Document Revised: 10/05/2011 Document Reviewed: 06/09/2011 [facility] Patient Information 2012 [place].PATIENT INSTRUCTIONS POST-ANESTHESIA  IMMEDIATELY FOLLOWING SURGERY:  Do not drive or operate machinery for the first twenty four hours after surgery.  Do not make any important decisions for twenty four hours after surgery or while taking narcotic pain medications or sedatives.  If you develop intractable nausea and vomiting or a severe headache please notify your doctor immediately.  FOLLOW-UP:  Please make an appointment with your surgeon as instructed. You do not need to follow up with anesthesia unless specifically instructed to do so.  WOUND CARE INSTRUCTIONS (if applicable):  Keep a dry clean dressing on the anesthesia/puncture wound site if there is drainage.  Once the wound has quit draining you may leave it open to air.  Generally you should leave the bandage intact for twenty four hours unless there is drainage.  If the epidural site drains for more than 36-48 hours please call the anesthesia department.  QUESTIONS?:  Please feel free to call your physician or the hospital operator if you have any questions, and they will be happy to assist you.

## 2012-08-29 ENCOUNTER — Encounter (HOSPITAL_COMMUNITY): Payer: Self-pay

## 2012-08-29 ENCOUNTER — Encounter (HOSPITAL_COMMUNITY)
Admission: RE | Admit: 2012-08-29 | Discharge: 2012-08-29 | Disposition: A | Discharge status: Home / Self Care | Payer: PRIVATE HEALTH INSURANCE | Source: Ambulatory Visit | Attending: Ophthalmology | Admitting: Ophthalmology

## 2012-08-29 ENCOUNTER — Other Ambulatory Visit: Payer: Self-pay

## 2012-08-29 LAB — HEMOGLOBIN AND HEMATOCRIT, BLOOD
HCT: 35.7 % — ABNORMAL LOW (ref 39.0–52.0)
Hemoglobin: 12.3 g/dL — ABNORMAL LOW (ref 13.0–17.0)

## 2012-08-29 LAB — BASIC METABOLIC PANEL
CO2: 28 mEq/L (ref 19–32)
Chloride: 106 mEq/L (ref 96–112)
Sodium: 139 mEq/L (ref 135–145)

## 2012-09-04 MED ORDER — CYCLOPENTOLATE-PHENYLEPHRINE 0.2-1 % OP SOLN
OPHTHALMIC | Status: AC
  Filled 2012-09-04: qty 2

## 2012-09-04 MED ORDER — TETRACAINE HCL 0.5 % OP SOLN
OPHTHALMIC | Status: AC
  Filled 2012-09-04: qty 2

## 2012-09-04 MED ORDER — PHENYLEPHRINE HCL 2.5 % OP SOLN
OPHTHALMIC | Status: AC
  Filled 2012-09-04: qty 2

## 2012-09-04 MED ORDER — LIDOCAINE HCL 3.5 % OP GEL
OPHTHALMIC | Status: AC
  Filled 2012-09-04: qty 5

## 2012-09-04 MED ORDER — NEOMYCIN-POLYMYXIN-DEXAMETH 3.5-10000-0.1 OP OINT
TOPICAL_OINTMENT | OPHTHALMIC | Status: AC
  Filled 2012-09-04: qty 3.5

## 2012-09-04 MED ORDER — LIDOCAINE HCL (PF) 1 % IJ SOLN
INTRAMUSCULAR | Status: AC
  Filled 2012-09-04: qty 2

## 2012-09-05 ENCOUNTER — Encounter (HOSPITAL_COMMUNITY): Payer: Self-pay | Admitting: Anesthesiology

## 2012-09-05 ENCOUNTER — Encounter (HOSPITAL_COMMUNITY): Payer: Self-pay | Admitting: *Deleted

## 2012-09-05 ENCOUNTER — Ambulatory Visit (HOSPITAL_COMMUNITY)
Admission: RE | Admit: 2012-09-05 | Discharge: 2012-09-05 | Disposition: A | Discharge status: Home / Self Care | Payer: PRIVATE HEALTH INSURANCE | Source: Ambulatory Visit | Attending: Ophthalmology | Admitting: Ophthalmology

## 2012-09-05 ENCOUNTER — Ambulatory Visit (HOSPITAL_COMMUNITY): Payer: PRIVATE HEALTH INSURANCE | Admitting: Anesthesiology

## 2012-09-05 ENCOUNTER — Encounter (HOSPITAL_COMMUNITY)
Admission: RE | Disposition: A | Discharge status: Home / Self Care | Payer: Self-pay | Source: Ambulatory Visit | Attending: Ophthalmology

## 2012-09-05 DIAGNOSIS — I10 Essential (primary) hypertension: Secondary | ICD-10-CM | POA: Insufficient documentation

## 2012-09-05 DIAGNOSIS — H2589 Other age-related cataract: Secondary | ICD-10-CM | POA: Insufficient documentation

## 2012-09-05 DIAGNOSIS — H2181 Floppy iris syndrome: Secondary | ICD-10-CM | POA: Insufficient documentation

## 2012-09-05 DIAGNOSIS — E119 Type 2 diabetes mellitus without complications: Secondary | ICD-10-CM | POA: Insufficient documentation

## 2012-09-05 DIAGNOSIS — Z01812 Encounter for preprocedural laboratory examination: Secondary | ICD-10-CM | POA: Insufficient documentation

## 2012-09-05 DIAGNOSIS — Z01810 Encounter for preprocedural cardiovascular examination: Secondary | ICD-10-CM | POA: Insufficient documentation

## 2012-09-05 HISTORY — PX: CATARACT EXTRACTION W/PHACO: SHX586

## 2012-09-05 SURGERY — PHACOEMULSIFICATION, CATARACT, WITH IOL INSERTION
Anesthesia: Monitor Anesthesia Care | Site: Eye | Laterality: Left | Wound class: Clean

## 2012-09-05 MED ORDER — FENTANYL CITRATE 0.05 MG/ML IJ SOLN: 25.0000 ug | INTRAMUSCULAR | Status: DC | PRN

## 2012-09-05 MED ORDER — MIDAZOLAM HCL 2 MG/2ML IJ SOLN
1.0000 mg | INTRAMUSCULAR | Status: DC | PRN
  Administered 2012-09-05: 2 mg via INTRAVENOUS

## 2012-09-05 MED ORDER — EPINEPHRINE HCL 1 MG/ML IJ SOLN
INTRAMUSCULAR | Status: AC
  Filled 2012-09-05: qty 1

## 2012-09-05 MED ORDER — POVIDONE-IODINE 5 % OP SOLN
OPHTHALMIC | Status: DC | PRN
  Administered 2012-09-05: 1 via OPHTHALMIC

## 2012-09-05 MED ORDER — BSS IO SOLN
INTRAOCULAR | Status: DC | PRN
  Administered 2012-09-05: 15 mL via INTRAOCULAR

## 2012-09-05 MED ORDER — LIDOCAINE HCL 3.5 % OP GEL
1.0000 "application " | Freq: Once | OPHTHALMIC | Status: AC
  Administered 2012-09-05: 1 via OPHTHALMIC

## 2012-09-05 MED ORDER — CYCLOPENTOLATE HCL 1 % OP SOLN: 1.0000 [drp] | OPHTHALMIC | Status: AC

## 2012-09-05 MED ORDER — LACTATED RINGERS IV SOLN
INTRAVENOUS | Status: DC | PRN
  Administered 2012-09-05: 08:00:00 via INTRAVENOUS

## 2012-09-05 MED ORDER — CYCLOPENTOLATE-PHENYLEPHRINE 0.2-1 % OP SOLN
1.0000 [drp] | OPHTHALMIC | Status: AC
  Administered 2012-09-05 (×3): 1 [drp] via OPHTHALMIC

## 2012-09-05 MED ORDER — PHENYLEPHRINE HCL 2.5 % OP SOLN
1.0000 [drp] | OPHTHALMIC | Status: AC
  Administered 2012-09-05 (×3): 1 [drp] via OPHTHALMIC

## 2012-09-05 MED ORDER — EPINEPHRINE HCL 1 MG/ML IJ SOLN
INTRAMUSCULAR | Status: AC
  Filled 2012-09-05: qty 11

## 2012-09-05 MED ORDER — TETRACAINE HCL 0.5 % OP SOLN
1.0000 [drp] | OPHTHALMIC | Status: AC
  Administered 2012-09-05 (×3): 1 [drp] via OPHTHALMIC

## 2012-09-05 MED ORDER — ONDANSETRON HCL 4 MG/2ML IJ SOLN: 4.0000 mg | Freq: Once | INTRAMUSCULAR | Status: DC | PRN

## 2012-09-05 MED ORDER — MIDAZOLAM HCL 2 MG/2ML IJ SOLN
INTRAMUSCULAR | Status: AC
  Filled 2012-09-05: qty 2

## 2012-09-05 MED ORDER — LACTATED RINGERS IV SOLN: INTRAVENOUS | Status: DC

## 2012-09-05 MED ORDER — LIDOCAINE HCL (PF) 1 % IJ SOLN
INTRAOCULAR | Status: DC | PRN
  Administered 2012-09-05: 09:00:00 via OPHTHALMIC

## 2012-09-05 MED ORDER — EPINEPHRINE HCL 1 MG/ML IJ SOLN
INTRAOCULAR | Status: DC | PRN
  Administered 2012-09-05: 08:00:00

## 2012-09-05 MED ORDER — NEOMYCIN-POLYMYXIN-DEXAMETH 0.1 % OP OINT
TOPICAL_OINTMENT | OPHTHALMIC | Status: DC | PRN
  Administered 2012-09-05: 1 via OPHTHALMIC

## 2012-09-05 MED ORDER — PROVISC 10 MG/ML IO SOLN
INTRAOCULAR | Status: DC | PRN
  Administered 2012-09-05: 8.5 mg via INTRAOCULAR

## 2012-09-05 SURGICAL SUPPLY — 32 items
CAPSULAR TENSION RING-AMO (OPHTHALMIC RELATED) IMPLANT
CLOTH BEACON ORANGE TIMEOUT ST (SAFETY) ×1 IMPLANT
EYE SHIELD UNIVERSAL CLEAR (GAUZE/BANDAGES/DRESSINGS) ×1 IMPLANT
GLOVE BIO SURGEON STRL SZ 6.5 (GLOVE) IMPLANT
GLOVE BIOGEL PI IND STRL 6.5 (GLOVE) IMPLANT
GLOVE BIOGEL PI IND STRL 7.0 (GLOVE) IMPLANT
GLOVE BIOGEL PI IND STRL 7.5 (GLOVE) IMPLANT
GLOVE BIOGEL PI INDICATOR 6.5 (GLOVE)
GLOVE BIOGEL PI INDICATOR 7.0 (GLOVE) ×1
GLOVE BIOGEL PI INDICATOR 7.5 (GLOVE)
GLOVE ECLIPSE 6.5 STRL STRAW (GLOVE) IMPLANT
GLOVE ECLIPSE 7.0 STRL STRAW (GLOVE) IMPLANT
GLOVE ECLIPSE 7.5 STRL STRAW (GLOVE) IMPLANT
GLOVE EXAM NITRILE LRG STRL (GLOVE) IMPLANT
GLOVE EXAM NITRILE MD LF STRL (GLOVE) ×1 IMPLANT
GLOVE SKINSENSE NS SZ6.5 (GLOVE)
GLOVE SKINSENSE NS SZ7.0 (GLOVE)
GLOVE SKINSENSE STRL SZ6.5 (GLOVE) IMPLANT
GLOVE SKINSENSE STRL SZ7.0 (GLOVE) IMPLANT
KIT VITRECTOMY (OPHTHALMIC RELATED) IMPLANT
PAD ARMBOARD 7.5X6 YLW CONV (MISCELLANEOUS) ×1 IMPLANT
PROC W NO LENS (INTRAOCULAR LENS)
PROC W SPEC LENS (INTRAOCULAR LENS)
PROCESS W NO LENS (INTRAOCULAR LENS) IMPLANT
PROCESS W SPEC LENS (INTRAOCULAR LENS) IMPLANT
RING MALYGIN (MISCELLANEOUS) IMPLANT
SIGHTPATH CAT PROC W REG LENS (Ophthalmic Related) ×2 IMPLANT
SYR TB 1ML LL NO SAFETY (SYRINGE) ×1 IMPLANT
TAPE SURG TRANSPORE 1 IN (GAUZE/BANDAGES/DRESSINGS) IMPLANT
TAPE SURGICAL TRANSPORE 1 IN (GAUZE/BANDAGES/DRESSINGS) ×1
VISCOELASTIC ADDITIONAL (OPHTHALMIC RELATED) IMPLANT
WATER STERILE IRR 250ML POUR (IV SOLUTION) ×1 IMPLANT

## 2012-09-05 NOTE — H&P (Signed)
I have reviewed the H&P, the patient was re-examined, and I have identified no interval changes in medical condition and plan of care since the history and physical of record  

## 2012-09-05 NOTE — Anesthesia Postprocedure Evaluation (Signed)
  Anesthesia Post-op Note  Patient: [name]  Procedure(s) Performed: Procedure(s) (LRB) with comments: CATARACT EXTRACTION PHACO AND INTRAOCULAR LENS PLACEMENT (IOC) (Left) - CDE=5.45  Patient Location: PACU  Anesthesia Type: MAC  Level of Consciousness: awake, alert , oriented and patient cooperative  Airway and Oxygen Therapy: Patient Spontanous Breathing room air  Post-op Pain: mild  Post-op Assessment: Post-op Vital signs reviewed, Patient's Cardiovascular Status Stable, Respiratory Function Stable, Patent Airway and No signs of Nausea or vomiting  Post-op Vital Signs: Reviewed and stable  Complications: No apparent anesthesia complications

## 2012-09-05 NOTE — Anesthesia Procedure Notes (Signed)
Procedure Name: MAC Date/Time: 09/05/2012 8:27 AM Performed by: Antony Contras, Rodel Glaspy L Pre-anesthesia Checklist: Patient identified, Patient being monitored, Emergency Drugs available, Timeout performed and Suction available Oxygen Delivery Method: Nasal cannula

## 2012-09-05 NOTE — Anesthesia Preprocedure Evaluation (Signed)
Anesthesia Evaluation  Patient identified by MRN, date of birth, ID band Patient awake    Reviewed: Allergy & Precautions, H&P , NPO status , Patient's Chart, lab work & pertinent test results  Airway Mallampati: II      Dental  (+) Edentulous Upper and Partial Lower   Pulmonary  breath sounds clear to auscultation        Cardiovascular hypertension, + CAD Rhythm:Regular Rate:Normal     Neuro/Psych    GI/Hepatic   Endo/Other  diabetes, Well Controlled, Type 2, Oral Hypoglycemic Agents  Renal/GU      Musculoskeletal   Abdominal   Peds  Hematology   Anesthesia Other Findings   Reproductive/Obstetrics                           Anesthesia Physical Anesthesia Plan  ASA: III  Anesthesia Plan: MAC   Post-op Pain Management:    Induction: Intravenous  Airway Management Planned: Nasal Cannula  Additional Equipment:   Intra-op Plan:   Post-operative Plan:   Informed Consent: I have reviewed the patients History and Physical, chart, labs and discussed the procedure including the risks, benefits and alternatives for the proposed anesthesia with the patient or authorized representative who has indicated his/her understanding and acceptance.     Plan Discussed with:   Anesthesia Plan Comments:         Anesthesia Quick Evaluation

## 2012-09-05 NOTE — Brief Op Note (Signed)
Pre-Op Dx: Cataract OS Post-Op Dx: Cataract OS Surgeon: Deriyah Kunath Anesthesia: Topical with MAC Surgery: Cataract Extraction with Intraocular lens Implant OS Implant: B&L enVista Specimen: None Complications: None 

## 2012-09-05 NOTE — Transfer of Care (Signed)
  Anesthesia Post-op Note  Patient: [name]  Procedure(s) Performed: Procedure(s) (LRB) with comments: CATARACT EXTRACTION PHACO AND INTRAOCULAR LENS PLACEMENT (IOC) (Left) - CDE=5.45  Patient Location: PACU  Anesthesia Type: MAC  Level of Consciousness: awake, alert , oriented and patient cooperative  Airway and Oxygen Therapy: Patient Spontanous Breathing room air  Post-op Pain: mild  Post-op Assessment: Post-op Vital signs reviewed, Patient's Cardiovascular Status Stable, Respiratory Function Stable, Patent Airway and No signs of Nausea or vomiting  Post-op Vital Signs: Reviewed and stable  Complications: No apparent anesthesia complications

## 2012-09-05 NOTE — Preoperative (Signed)
Beta Blockers   Reason not to administer Beta Blockers:Not Applicable 

## 2012-09-06 NOTE — Op Note (Signed)
NAMESAMEUL, SCHRICK                   ACCOUNT NO.:  [id number]  MEDICAL RECORD NO.:  [id number]  LOCATION:  APPO                          FACILITY:  [facility]  PHYSICIAN:  Richardo Hanks, MD       DATE OF BIRTH:  [birth year]  DATE OF PROCEDURE:  09/05/2012 DATE OF DISCHARGE:  09/05/2012                              OPERATIVE REPORT   PREOPERATIVE DIAGNOSIS:  Combined cataract, left eye, diagnosis code 366.19.  POSTOPERATIVE DIAGNOSES:  Combined cataract, left eye, diagnosis code 366.19.  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  OPERATION PERFORMED:  Phacoemulsification with posterior chamber intraocular lens implantation, left eye.  SURGEON:  Franky Macho. Taysia Rivere, MD  ANESTHESIA:  Topical with monitored anesthesia care and IV sedation.  OPERATIVE SUMMARY:  In the preoperative area, dilating drops were placed into the left eye.  The patient was then brought into the operating room where he was placed under general anesthesia.  The eye was then prepped and draped.  Beginning with a 75 blade, a paracentesis port was made at the surgeon's 2 o'clock position.  The anterior chamber was then filled with a 1% nonpreserved lidocaine solution with epinephrine.  This was followed by Viscoat to deepen the chamber.  A small fornix-based peritomy was performed superiorly.  Next, a single iris hook was placed through the limbus superiorly.  A 2.4-mm keratome blade was then used to make a clear corneal incision over the iris hook.  A bent cystotome needle and Utrata forceps were used to create a continuous tear capsulotomy.  Hydrodissection was performed using balanced salt solution on a fine cannula.  The lens nucleus was then removed using phacoemulsification in a quadrant cracking technique.  The cortical material was then removed with irrigation and aspiration.  The capsular bag and anterior chamber were refilled with Provisc.  The wound was widened to approximately 3 mm and a posterior  chamber intraocular lens was placed into the capsular bag without difficulty using an [employer] lens injecting system.  A single 10-0 nylon suture was then used to close the incision as well as stromal hydration.  The Provisc was removed from the anterior chamber and capsular bag with irrigation and aspiration.  At this point, the wounds were tested for leak, which were negative.  The anterior chamber remained deep and stable.  The patient tolerated the procedure well.  There were no operative complications, and he awoke from general anesthesia without problem.  No surgical specimens.  Prosthetic device used is a Bausch and Lomb posterior chamber lens, model enVista, model number MX60, power of 17.5, serial number is [id number].          ______________________________ Richardo Hanks, MD     KEH/MEDQ  D:  09/05/2012  T:  09/06/2012  Job:  [id number]

## 2012-09-09 ENCOUNTER — Encounter (HOSPITAL_COMMUNITY): Payer: Self-pay | Admitting: Ophthalmology

## 2012-09-10 LAB — GLUCOSE, CAPILLARY: Glucose-Capillary: 127 mg/dL — ABNORMAL HIGH (ref 70–99)

## 2012-09-17 ENCOUNTER — Encounter (HOSPITAL_COMMUNITY): Payer: Self-pay | Admitting: Pharmacy Technician

## 2012-09-20 ENCOUNTER — Inpatient Hospital Stay (HOSPITAL_COMMUNITY): Admission: RE | Admit: 2012-09-20 | Discharge: 2012-09-20 | Payer: PRIVATE HEALTH INSURANCE | Source: Ambulatory Visit

## 2012-10-02 MED ORDER — CYCLOPENTOLATE-PHENYLEPHRINE 0.2-1 % OP SOLN
OPHTHALMIC | Status: AC
  Filled 2012-10-02: qty 2

## 2012-10-02 MED ORDER — TETRACAINE HCL 0.5 % OP SOLN
OPHTHALMIC | Status: AC
  Filled 2012-10-02: qty 2

## 2012-10-02 MED ORDER — LIDOCAINE HCL (PF) 1 % IJ SOLN
INTRAMUSCULAR | Status: AC
  Filled 2012-10-02: qty 2

## 2012-10-02 MED ORDER — PHENYLEPHRINE HCL 2.5 % OP SOLN
OPHTHALMIC | Status: AC
  Filled 2012-10-02: qty 2

## 2012-10-02 MED ORDER — NEOMYCIN-POLYMYXIN-DEXAMETH 3.5-10000-0.1 OP OINT
TOPICAL_OINTMENT | OPHTHALMIC | Status: AC
  Filled 2012-10-02: qty 3.5

## 2012-10-02 MED ORDER — LIDOCAINE HCL 3.5 % OP GEL
OPHTHALMIC | Status: AC
  Filled 2012-10-02: qty 5

## 2012-10-03 ENCOUNTER — Encounter (HOSPITAL_COMMUNITY)
Admission: RE | Disposition: A | Discharge status: Home / Self Care | Payer: Self-pay | Source: Ambulatory Visit | Attending: Ophthalmology

## 2012-10-03 ENCOUNTER — Ambulatory Visit (HOSPITAL_COMMUNITY): Payer: PRIVATE HEALTH INSURANCE | Admitting: Anesthesiology

## 2012-10-03 ENCOUNTER — Encounter (HOSPITAL_COMMUNITY): Payer: Self-pay | Admitting: *Deleted

## 2012-10-03 ENCOUNTER — Encounter (HOSPITAL_COMMUNITY): Payer: Self-pay | Admitting: Anesthesiology

## 2012-10-03 ENCOUNTER — Ambulatory Visit (HOSPITAL_COMMUNITY)
Admission: RE | Admit: 2012-10-03 | Discharge: 2012-10-03 | Disposition: A | Discharge status: Home / Self Care | Payer: PRIVATE HEALTH INSURANCE | Source: Ambulatory Visit | Attending: Ophthalmology | Admitting: Ophthalmology

## 2012-10-03 DIAGNOSIS — H269 Unspecified cataract: Secondary | ICD-10-CM | POA: Insufficient documentation

## 2012-10-03 HISTORY — PX: CATARACT EXTRACTION W/PHACO: SHX586

## 2012-10-03 LAB — GLUCOSE, CAPILLARY: Glucose-Capillary: 131 mg/dL — ABNORMAL HIGH (ref 70–99)

## 2012-10-03 SURGERY — PHACOEMULSIFICATION, CATARACT, WITH IOL INSERTION
Anesthesia: Monitor Anesthesia Care | Site: Eye | Laterality: Right | Wound class: Clean

## 2012-10-03 MED ORDER — EPINEPHRINE HCL 1 MG/ML IJ SOLN
INTRAOCULAR | Status: DC | PRN
  Administered 2012-10-03: 08:00:00

## 2012-10-03 MED ORDER — LIDOCAINE HCL 3.5 % OP GEL: 1.0000 "application " | Freq: Once | OPHTHALMIC | Status: DC

## 2012-10-03 MED ORDER — TETRACAINE HCL 0.5 % OP SOLN
1.0000 [drp] | OPHTHALMIC | Status: AC
  Administered 2012-10-03 (×3): 1 [drp] via OPHTHALMIC

## 2012-10-03 MED ORDER — CYCLOPENTOLATE-PHENYLEPHRINE 0.2-1 % OP SOLN
1.0000 [drp] | OPHTHALMIC | Status: AC
  Administered 2012-10-03 (×3): 1 [drp] via OPHTHALMIC

## 2012-10-03 MED ORDER — POVIDONE-IODINE 5 % OP SOLN
OPHTHALMIC | Status: DC | PRN
  Administered 2012-10-03: 1 via OPHTHALMIC

## 2012-10-03 MED ORDER — PHENYLEPHRINE HCL 2.5 % OP SOLN
1.0000 [drp] | OPHTHALMIC | Status: AC
  Administered 2012-10-03 (×3): 1 [drp] via OPHTHALMIC

## 2012-10-03 MED ORDER — LACTATED RINGERS IV SOLN
INTRAVENOUS | Status: DC
  Administered 2012-10-03: 1000 mL via INTRAVENOUS

## 2012-10-03 MED ORDER — MIDAZOLAM HCL 2 MG/2ML IJ SOLN
INTRAMUSCULAR | Status: AC
  Filled 2012-10-03: qty 2

## 2012-10-03 MED ORDER — EPINEPHRINE HCL 1 MG/ML IJ SOLN
INTRAMUSCULAR | Status: AC
  Filled 2012-10-03: qty 1

## 2012-10-03 MED ORDER — LIDOCAINE HCL (PF) 1 % IJ SOLN
INTRAMUSCULAR | Status: DC | PRN
  Administered 2012-10-03: .5 mL

## 2012-10-03 MED ORDER — LIDOCAINE 3.5 % OP GEL OPTIME - NO CHARGE
OPHTHALMIC | Status: DC | PRN
  Administered 2012-10-03: 2 [drp]

## 2012-10-03 MED ORDER — NEOMYCIN-POLYMYXIN-DEXAMETH 0.1 % OP OINT
TOPICAL_OINTMENT | OPHTHALMIC | Status: DC | PRN
  Administered 2012-10-03: 1 via OPHTHALMIC

## 2012-10-03 MED ORDER — BSS IO SOLN
INTRAOCULAR | Status: DC | PRN
  Administered 2012-10-03: 15 mL via INTRAOCULAR

## 2012-10-03 MED ORDER — PROVISC 10 MG/ML IO SOLN
INTRAOCULAR | Status: DC | PRN
  Administered 2012-10-03: 8.5 mg via INTRAOCULAR

## 2012-10-03 MED ORDER — MIDAZOLAM HCL 2 MG/2ML IJ SOLN
1.0000 mg | INTRAMUSCULAR | Status: DC | PRN
  Administered 2012-10-03: 2 mg via INTRAVENOUS

## 2012-10-03 SURGICAL SUPPLY — 32 items

## 2012-10-03 NOTE — Anesthesia Postprocedure Evaluation (Signed)
  Anesthesia Post-op Note  Patient: [name]  Procedure(s) Performed: Procedure(s) (LRB) with comments: CATARACT EXTRACTION PHACO AND INTRAOCULAR LENS PLACEMENT (IOC) (Right) - CDE: 12.31  Patient Location: Short Stay  Anesthesia Type:MAC  Level of Consciousness: awake, alert , oriented and patient cooperative  Airway and Oxygen Therapy: Patient Spontanous Breathing  Post-op Pain: none  Post-op Assessment: Post-op Vital signs reviewed, Patient's Cardiovascular Status Stable, Respiratory Function Stable, Patent Airway and Pain level controlled  Post-op Vital Signs: Reviewed and stable  Complications: No apparent anesthesia complications

## 2012-10-03 NOTE — Brief Op Note (Signed)
Pre-Op Dx: Cataract OD Post-Op Dx: Cataract OD Surgeon: Tonny Branch Anesthesia: Topical with MAC Surgery: Cataract Extraction with Intraocular lens Implant OD Implant: B&L enVista Specimen: None Complications: None

## 2012-10-03 NOTE — Transfer of Care (Signed)
Immediate Anesthesia Transfer of Care Note  Patient: [name]  Procedure(s) Performed: Procedure(s) (LRB) with comments: CATARACT EXTRACTION PHACO AND INTRAOCULAR LENS PLACEMENT (IOC) (Right) - CDE: 12.31  Patient Location: Short Stay  Anesthesia Type:MAC  Level of Consciousness: awake, alert , oriented and patient cooperative  Airway & Oxygen Therapy: Patient Spontanous Breathing  Post-op Assessment: Report given to PACU RN, Post -op Vital signs reviewed and stable and Patient moving all extremities  Post vital signs: Reviewed and stable  Complications: No apparent anesthesia complications

## 2012-10-03 NOTE — H&P (Signed)
I have reviewed the H&P, the patient was re-examined, and I have identified no interval changes in medical condition and plan of care since the history and physical of record  

## 2012-10-03 NOTE — Op Note (Signed)
NAMEJONANTHAN, [name] NO.:  [id number]  MEDICAL RECORD NO.:  [id number]  LOCATION:  APPO                          FACILITY:  [facility]  PHYSICIAN:  Richardo Hanks, MD       DATE OF BIRTH:  04/18/1951  DATE OF PROCEDURE:  10/03/2012 DATE OF DISCHARGE:  10/03/2012                              OPERATIVE REPORT   PREOPERATIVE DIAGNOSIS:  Combined cataract, right eye, diagnosis code 366.19.  POSTOPERATIVE DIAGNOSIS:  Combined cataract, right eye, diagnosis code 366.19.  OPERATION PERFORMED:  Phacoemulsification with posterior chamber intraocular lens implantation, right eye.  SURGEON:  Franky Macho. Leata Dominy, MD  ANESTHESIA:  Topical with monitored anesthesia care and IV sedation.  OPERATIVE SUMMARY:  In the preoperative area, dilating drops were placed into the right eye.  The patient was then brought into the operating room where he was placed under general anesthesia.  The eye was then prepped and draped.  Beginning with a 75 blade, a paracentesis port was made at the surgeon's 2 o'clock position.  The anterior chamber was then filled with a 1% nonpreserved lidocaine solution with epinephrine.  This was followed by Viscoat to deepen the chamber.  A small fornix-based peritomy was performed superiorly.  Next, a single iris hook was placed through the limbus superiorly.  A 2.4-mm keratome blade was then used to make a clear corneal incision over the iris hook.  A bent cystotome needle and Utrata forceps were used to create a continuous tear capsulotomy.  Hydrodissection was performed using balanced salt solution on a fine cannula.  The lens nucleus was then removed using phacoemulsification in a quadrant cracking technique.  The cortical material was then removed with irrigation and aspiration.  The capsular bag and anterior chamber were refilled with Provisc.  The wound was widened to approximately 3 mm and a posterior chamber intraocular lens was placed into the capsular  bag without difficulty using an [employer] lens injecting system.  A single 10-0 nylon suture was then used to close the incision as well as stromal hydration.  The Provisc was removed from the anterior chamber and capsular bag with irrigation and aspiration.  At this point, the wounds were tested for leak, which were negative.  The anterior chamber remained deep and stable.  The patient tolerated the procedure well.  There were no operative complications, and he awoke from general anesthesia without problem.  No surgical specimens.  Prosthetic device used is a [employer], model EnVista, model number MX60, power of 18.0, serial number is [id number].          ______________________________ Richardo Hanks, MD     KEH/MEDQ  D:  10/03/2012  T:  10/03/2012  Job:  [id number]

## 2012-10-03 NOTE — Anesthesia Preprocedure Evaluation (Signed)
Anesthesia Evaluation  Patient identified by MRN, date of birth, ID band Patient awake    Reviewed: Allergy & Precautions, H&P , NPO status , Patient's Chart, lab work & pertinent test results  Airway Mallampati: II      Dental  (+) Edentulous Upper and Partial Lower   Pulmonary  breath sounds clear to auscultation        Cardiovascular hypertension, + CAD Rhythm:Regular Rate:Normal     Neuro/Psych    GI/Hepatic   Endo/Other  diabetes, Well Controlled, Type 2, Oral Hypoglycemic Agents  Renal/GU      Musculoskeletal   Abdominal   Peds  Hematology   Anesthesia Other Findings   Reproductive/Obstetrics                           Anesthesia Physical Anesthesia Plan  ASA: III  Anesthesia Plan: MAC   Post-op Pain Management:    Induction: Intravenous  Airway Management Planned: Nasal Cannula  Additional Equipment:   Intra-op Plan:   Post-operative Plan:   Informed Consent: I have reviewed the patients History and Physical, chart, labs and discussed the procedure including the risks, benefits and alternatives for the proposed anesthesia with the patient or authorized representative who has indicated his/her understanding and acceptance.     Plan Discussed with:   Anesthesia Plan Comments:         Anesthesia Quick Evaluation

## 2012-10-08 ENCOUNTER — Encounter (HOSPITAL_COMMUNITY): Payer: Self-pay | Admitting: Ophthalmology

## 2013-01-14 ENCOUNTER — Telehealth: Payer: Self-pay | Admitting: *Deleted

## 2013-01-14 NOTE — Telephone Encounter (Signed)
rx faxed to [facility]

## 2013-01-27 ENCOUNTER — Other Ambulatory Visit: Payer: Self-pay | Admitting: Family Medicine

## 2013-02-10 ENCOUNTER — Other Ambulatory Visit: Payer: Self-pay | Admitting: *Deleted

## 2013-02-10 ENCOUNTER — Other Ambulatory Visit: Payer: Self-pay | Admitting: Family Medicine

## 2013-02-10 NOTE — Telephone Encounter (Signed)
Crestor was denied, NTBS

## 2013-02-11 ENCOUNTER — Telehealth: Payer: Self-pay | Admitting: Nurse Practitioner

## 2013-02-11 ENCOUNTER — Other Ambulatory Visit: Payer: Self-pay

## 2013-02-12 NOTE — Telephone Encounter (Signed)
NOT SEEN IN OVER A YEAR. WILL NEED TO MAKE AN APPT.

## 2013-06-03 ENCOUNTER — Ambulatory Visit (INDEPENDENT_AMBULATORY_CARE_PROVIDER_SITE_OTHER): Payer: BC Managed Care – PPO | Admitting: General Practice

## 2013-06-03 ENCOUNTER — Encounter: Payer: Self-pay | Admitting: General Practice

## 2013-06-03 VITALS — BP 128/77 | HR 69 | Temp 97.5°F | Ht 72.0 in | Wt 253.0 lb

## 2013-06-03 DIAGNOSIS — N390 Urinary tract infection, site not specified: Secondary | ICD-10-CM

## 2013-06-03 DIAGNOSIS — I10 Essential (primary) hypertension: Secondary | ICD-10-CM

## 2013-06-03 DIAGNOSIS — E785 Hyperlipidemia, unspecified: Secondary | ICD-10-CM

## 2013-06-03 DIAGNOSIS — E119 Type 2 diabetes mellitus without complications: Secondary | ICD-10-CM

## 2013-06-03 DIAGNOSIS — I2510 Atherosclerotic heart disease of native coronary artery without angina pectoris: Secondary | ICD-10-CM

## 2013-06-03 LAB — POCT UA - MICROSCOPIC ONLY
Crystals, Ur, HPF, POC: NEGATIVE
Epithelial cells, urine per micros: NEGATIVE
WBC, Ur, HPF, POC: NEGATIVE

## 2013-06-03 LAB — POCT CBC
Granulocyte percent: 60.6 %G (ref 37–80)
HCT, POC: 40.2 % — AB (ref 43.5–53.7)
Hemoglobin: 13.1 g/dL — AB (ref 14.1–18.1)
MCV: 89.3 fL (ref 80–97)
POC Granulocyte: 3.7 (ref 2–6.9)
RBC: 4.5 M/uL — AB (ref 4.69–6.13)

## 2013-06-03 LAB — POCT URINALYSIS DIPSTICK
Spec Grav, UA: 1.015
Urobilinogen, UA: NEGATIVE

## 2013-06-03 LAB — POCT GLYCOSYLATED HEMOGLOBIN (HGB A1C): Hemoglobin A1C: 7.2

## 2013-06-03 MED ORDER — ROSUVASTATIN CALCIUM 20 MG PO TABS: 20.0000 mg | ORAL_TABLET | Freq: Every day | ORAL | Status: DC

## 2013-06-03 MED ORDER — LOSARTAN POTASSIUM 100 MG PO TABS: 100.0000 mg | ORAL_TABLET | Freq: Every day | ORAL | Status: DC

## 2013-06-03 MED ORDER — LISINOPRIL 20 MG PO TABS: 20.0000 mg | ORAL_TABLET | Freq: Every day | ORAL | Status: DC

## 2013-06-03 MED ORDER — AMLODIPINE BESYLATE 5 MG PO TABS: 5.0000 mg | ORAL_TABLET | Freq: Every day | ORAL | Status: DC

## 2013-06-03 MED ORDER — ATENOLOL 50 MG PO TABS: 50.0000 mg | ORAL_TABLET | Freq: Every day | ORAL | Status: DC

## 2013-06-03 MED ORDER — CILOSTAZOL 100 MG PO TABS: 100.0000 mg | ORAL_TABLET | Freq: Two times a day (BID) | ORAL | Status: DC

## 2013-06-03 MED ORDER — FUROSEMIDE 80 MG PO TABS: 80.0000 mg | ORAL_TABLET | Freq: Every day | ORAL | Status: DC

## 2013-06-03 NOTE — Patient Instructions (Signed)

## 2013-06-03 NOTE — Progress Notes (Signed)
  Subjective:    Patient ID: [name], male    DOB: Mar 10, 1951, 62 y.o.   MRN: [id number]  HPI Patient presents today for 3 follow up of chronic health conditions. He has history of hyperlipidemia, hypertension, diabetes, CAD. He reports taking medications as prescribed, but recently ran out of some of medications. Reports seeing an endocrinologist in [place] and prescribes Byetta, levemir, and novolog. Patient reports he has lost 66 pounds over past two years which is the length of time he has been seeing endocrinologist. He reports adhering to a diabetic diet.     Review of Systems  Constitutional: Negative for fever and chills.  HENT: Negative for ear pain, neck pain and neck stiffness.   Eyes: Negative for pain and visual disturbance.  Respiratory: Negative for chest tightness and shortness of breath.   Cardiovascular: Negative for chest pain and palpitations.  Gastrointestinal: Negative for vomiting, abdominal pain, diarrhea and blood in stool.  Genitourinary: Negative for difficulty urinating.  Musculoskeletal: Negative for back pain.  Neurological: Negative for dizziness, weakness and headaches.       Objective:   Physical Exam  Constitutional: He is oriented to person, place, and time. He appears well-developed and well-nourished.  HENT:  Head: Normocephalic and atraumatic.  Right Ear: External ear normal.  Left Ear: External ear normal.  Mouth/Throat: Oropharynx is clear and moist.  Neck: Normal range of motion. Neck supple. No thyromegaly present.  Cardiovascular: Normal rate, regular rhythm and normal heart sounds.   Pulmonary/Chest: Effort normal and breath sounds normal. No respiratory distress. He exhibits no tenderness.  Abdominal: Soft. Bowel sounds are normal.  Musculoskeletal:  Right above knee amputation in 2003  Lymphadenopathy:    He has no cervical adenopathy.  Neurological: He is alert and oriented to person, place, and time.  Skin: Skin is warm and  dry.  Psychiatric: He has a normal mood and affect.          Assessment & Plan:  1. Essential hypertension, benign - POCT CBC - CMP14+EGFR - atenolol (TENORMIN) 50 MG tablet; Take 1 tablet (50 mg total) by mouth daily.  Dispense: 90 tablet; Refill: 0 - lisinopril (ZESTRIL) 20 MG tablet; Take 1 tablet (20 mg total) by mouth daily.  Dispense: 90 tablet; Refill: 0 - furosemide (LASIX) 80 MG tablet; Take 1 tablet (80 mg total) by mouth daily.  Dispense: 90 tablet; Refill: 0 - amLODipine (NORVASC) 5 MG tablet; Take 1 tablet (5 mg total) by mouth daily.  Dispense: 90 tablet; Refill: 0  2. Other and unspecified hyperlipidemia - NMR, lipoprofile - rosuvastatin (CRESTOR) 20 MG tablet; Take 1 tablet (20 mg total) by mouth daily.  Dispense: 30 tablet; Refill: 3  3. Diabetes - POCT UA - Microscopic Only - POCT glycosylated hemoglobin (Hb A1C)  4. CAD (coronary artery disease) - Vitamin D 25 hydroxy - cilostazol (PLETAL) 100 MG tablet; Take 1 tablet (100 mg total) by mouth 2 (two) times daily.  Dispense: 60 tablet; Refill: 3  5. Urinary tract infection, site not specified - POCT urinalysis dipstick -Continue all current medications Labs pending F/u in 3 months and sooner if symptoms develop Discussed exercise and healthy eating Patient verbalized understanding Erby Pian, FNP-C

## 2013-06-04 LAB — CMP14+EGFR
AST: 12 IU/L (ref 0–40)
Albumin: 3.9 g/dL (ref 3.6–4.8)
Alkaline Phosphatase: 83 IU/L (ref 39–117)
BUN/Creatinine Ratio: 10 (ref 10–22)
BUN: 16 mg/dL (ref 8–27)
Chloride: 109 mmol/L — ABNORMAL HIGH (ref 97–108)
GFR calc Af Amer: 51 mL/min/{1.73_m2} — ABNORMAL LOW (ref >59)
Sodium: 144 mmol/L (ref 134–144)
Total Bilirubin: 0.3 mg/dL (ref 0.0–1.2)

## 2013-06-04 LAB — NMR, LIPOPROFILE
Cholesterol: 189 mg/dL (ref <200)
HDL Cholesterol by NMR: 54 mg/dL (ref >=40)
HDL Particle Number: 26.4 umol/L — ABNORMAL LOW (ref >=30.5)
Small LDL Particle Number: 517 nmol/L (ref <=527)
Triglycerides by NMR: 59 mg/dL (ref <150)

## 2013-06-04 LAB — VITAMIN D 25 HYDROXY (VIT D DEFICIENCY, FRACTURES): Vit D, 25-Hydroxy: 23.9 ng/mL — ABNORMAL LOW (ref 30.0–100.0)

## 2013-06-09 ENCOUNTER — Other Ambulatory Visit: Payer: Self-pay | Admitting: Nurse Practitioner

## 2013-06-17 ENCOUNTER — Telehealth: Payer: Self-pay | Admitting: General Practice

## 2013-06-17 NOTE — Telephone Encounter (Signed)
Notified of lab results. See lab note

## 2013-09-02 ENCOUNTER — Other Ambulatory Visit: Payer: Self-pay

## 2013-09-02 DIAGNOSIS — I10 Essential (primary) hypertension: Secondary | ICD-10-CM

## 2013-09-02 MED ORDER — FUROSEMIDE 80 MG PO TABS: 80.0000 mg | ORAL_TABLET | Freq: Every day | ORAL | Status: DC

## 2013-09-02 MED ORDER — LISINOPRIL 20 MG PO TABS: 20.0000 mg | ORAL_TABLET | Freq: Every day | ORAL | Status: DC

## 2013-09-04 ENCOUNTER — Ambulatory Visit: Payer: BC Managed Care – PPO | Admitting: General Practice

## 2013-09-05 ENCOUNTER — Other Ambulatory Visit: Payer: Self-pay

## 2013-09-05 DIAGNOSIS — I10 Essential (primary) hypertension: Secondary | ICD-10-CM

## 2013-09-05 MED ORDER — FUROSEMIDE 80 MG PO TABS: 80.0000 mg | ORAL_TABLET | Freq: Every day | ORAL | Status: DC

## 2013-09-05 MED ORDER — LISINOPRIL 20 MG PO TABS: 20.0000 mg | ORAL_TABLET | Freq: Every day | ORAL | Status: DC

## 2013-10-01 ENCOUNTER — Other Ambulatory Visit: Payer: Self-pay | Admitting: *Deleted

## 2013-10-01 DIAGNOSIS — E785 Hyperlipidemia, unspecified: Secondary | ICD-10-CM

## 2013-10-01 MED ORDER — ROSUVASTATIN CALCIUM 20 MG PO TABS: 20.0000 mg | ORAL_TABLET | Freq: Every day | ORAL | Status: DC

## 2013-10-01 MED ORDER — LOSARTAN POTASSIUM 100 MG PO TABS: 100.0000 mg | ORAL_TABLET | Freq: Every day | ORAL | Status: DC

## 2013-10-02 ENCOUNTER — Telehealth: Payer: Self-pay | Admitting: General Practice

## 2013-10-20 ENCOUNTER — Other Ambulatory Visit: Payer: Self-pay

## 2013-10-20 DIAGNOSIS — I2510 Atherosclerotic heart disease of native coronary artery without angina pectoris: Secondary | ICD-10-CM

## 2013-10-20 MED ORDER — CILOSTAZOL 100 MG PO TABS: 100.0000 mg | ORAL_TABLET | Freq: Two times a day (BID) | ORAL | Status: DC

## 2013-10-27 ENCOUNTER — Encounter (INDEPENDENT_AMBULATORY_CARE_PROVIDER_SITE_OTHER): Payer: Self-pay

## 2013-10-27 ENCOUNTER — Other Ambulatory Visit: Payer: Self-pay

## 2013-10-27 ENCOUNTER — Encounter: Payer: Self-pay | Admitting: General Practice

## 2013-10-27 ENCOUNTER — Ambulatory Visit (INDEPENDENT_AMBULATORY_CARE_PROVIDER_SITE_OTHER): Payer: BC Managed Care – PPO | Admitting: General Practice

## 2013-10-27 VITALS — BP 90/60 | HR 53 | Temp 97.7°F | Wt 243.5 lb

## 2013-10-27 DIAGNOSIS — E119 Type 2 diabetes mellitus without complications: Secondary | ICD-10-CM

## 2013-10-27 DIAGNOSIS — I2510 Atherosclerotic heart disease of native coronary artery without angina pectoris: Secondary | ICD-10-CM

## 2013-10-27 DIAGNOSIS — I10 Essential (primary) hypertension: Secondary | ICD-10-CM

## 2013-10-27 DIAGNOSIS — E785 Hyperlipidemia, unspecified: Secondary | ICD-10-CM

## 2013-10-27 DIAGNOSIS — Z09 Encounter for follow-up examination after completed treatment for conditions other than malignant neoplasm: Secondary | ICD-10-CM

## 2013-10-27 LAB — POCT UA - MICROSCOPIC ONLY
Bacteria, U Microscopic: NEGATIVE
Mucus, UA: NEGATIVE
RBC, urine, microscopic: NEGATIVE
WBC, Ur, HPF, POC: NEGATIVE
Yeast, UA: NEGATIVE

## 2013-10-27 LAB — POCT URINALYSIS DIPSTICK
Bilirubin, UA: NEGATIVE
Blood, UA: NEGATIVE
Glucose, UA: 250
Ketones, UA: NEGATIVE
Spec Grav, UA: 1.025
pH, UA: 5

## 2013-10-27 LAB — POCT GLYCOSYLATED HEMOGLOBIN (HGB A1C): Hemoglobin A1C: 10.3

## 2013-10-27 MED ORDER — ATENOLOL 25 MG PO TABS: 25.0000 mg | ORAL_TABLET | Freq: Every day | ORAL | Status: DC

## 2013-10-27 MED ORDER — CILOSTAZOL 100 MG PO TABS: 100.0000 mg | ORAL_TABLET | Freq: Two times a day (BID) | ORAL | Status: DC

## 2013-10-27 MED ORDER — LISINOPRIL 10 MG PO TABS: 10.0000 mg | ORAL_TABLET | Freq: Every day | ORAL | Status: DC

## 2013-10-27 NOTE — Progress Notes (Signed)
Subjective:    Patient ID: [name], male    DOB: [birth year], 62 y.o.   MRN: [id number]  HPI Patient presents today for 3 follow up of chronic health conditions. He has history of hyperlipidemia, hypertension, diabetes mellitus type 1,and CAD. He reports taking medications as prescribed, but recently had a couple of dizzy episodes. He feels his blood pressure may have been low, but doesn't have ability to check blood pressure at home. Reports seeing an endocrinologist in [place], where Byetta, levemir, and novolog are prescribed. Patient reports he has lost and additional 10 pounds, for a total of 76 pounds over past two years. He reports adhering to a diabetic diet. Patient's blood pressure today is 90/60 and heart rate 53.     Review of Systems  Constitutional: Negative for fever and chills.  HENT: Negative for ear pain.   Eyes: Negative for pain and visual disturbance.  Respiratory: Negative for chest tightness and shortness of breath.   Cardiovascular: Negative for chest pain and palpitations.  Gastrointestinal: Negative for vomiting, abdominal pain, diarrhea and blood in stool.  Genitourinary: Negative for difficulty urinating.  Musculoskeletal: Negative for back pain, neck pain and neck stiffness.  Neurological: Negative for dizziness, weakness and headaches.       Objective:   Physical Exam  Constitutional: He is oriented to person, place, and time. He appears well-developed and well-nourished.  HENT:  Head: Normocephalic and atraumatic.  Right Ear: External ear normal.  Left Ear: External ear normal.  Mouth/Throat: Oropharynx is clear and moist.  Neck: Normal range of motion. Neck supple. No thyromegaly present.  Cardiovascular: Normal rate, regular rhythm and normal heart sounds.   Pulmonary/Chest: Effort normal and breath sounds normal. No respiratory distress. He exhibits no tenderness.  Abdominal: Soft. Bowel sounds are normal.  Musculoskeletal:  Right above knee  amputation in 2003  Lymphadenopathy:    He has no cervical adenopathy.  Neurological: He is alert and oriented to person, place, and time.  Skin: Skin is warm and dry.  Psychiatric: He has a normal mood and affect.          Assessment & Plan:  1. Hypertension  - CMP14+EGFR - CBC With differential/Platelet lisinopril (ZESTRIL) 10 MG tablet; Take 1 tablet (10 mg total) by mouth daily.  Dispense: 90 tablet; Refill: 0 - atenolol (TENORMIN) 25 MG tablet; Take 1 tablet (25 mg total) by mouth daily.  Dispense: 30 tablet; Refill: 0 -discussed decrease in lisinopril from 20 to 10mg  daily -discussed decrease in atenolol from 50 to 25mg  daily due to heart rate of 53 -Patient to monitor blood pressure 3 times daily and maintain a diary, bring to visit in 3 days -Patient and wife verbalized understanding of importance of checking blood pressure  -may seek emergency medical treatment as needed   2. DM type 2 (diabetes mellitus, type 2)  - POCT glycosylated hemoglobin (Hb A1C) - POCT UA - Microscopic Only - POCT urinalysis dipstick - CBC With differential/Platelet - POCT glucose (manual entry)  3. Follow-up exam, 3-6 months since previous exam  - CBC With differential/Platelet  4. Hyperlipidemia  - NMR, lipoprofile - CBC With differential/Platelet  5. CAD (coronary artery disease)  - cilostazol (PLETAL) 100 MG tablet; Take 1 tablet (100 mg total) by mouth 2 (two) times daily.  Dispense: 60 tablet; Refill: 3 - CBC With differential/Platelet  6. Other and unspecified hyperlipidemia Continue all current medications Labs pending F/u in 3 days for blood pressure recheck and 3 months  for routine visit Discussed benefits of regular exercise and healthy eating Patient verbalized understanding Erby Pian, FNP-C

## 2013-10-27 NOTE — Patient Instructions (Signed)
Hypertriglyceridemia  Diet for High blood levels of Triglycerides Most fats in food are triglycerides. Triglycerides in your blood are stored as fat in your body. High levels of triglycerides in your blood may put you at a greater risk for heart disease and stroke.  Normal triglyceride levels are less than 150 mg/dL. Borderline high levels are 150-199 mg/dl. High levels are 200 - 499 mg/dL, and very high triglyceride levels are greater than 500 mg/dL. The decision to treat high triglycerides is generally based on the level. For people with borderline or high triglyceride levels, treatment includes weight loss and exercise. Drugs are recommended for people with very high triglyceride levels. Many people who need treatment for high triglyceride levels have metabolic syndrome. This syndrome is a collection of disorders that often include: insulin resistance, high blood pressure, blood clotting problems, high cholesterol and triglycerides. TESTING PROCEDURE FOR TRIGLYCERIDES  You should not eat 4 hours before getting your triglycerides measured. The normal range of triglycerides is between 10 and 250 milligrams per deciliter (mg/dl). Some people may have extreme levels (1000 or above), but your triglyceride level may be too high if it is above 150 mg/dl, depending on what other risk factors you have for heart disease.  People with high blood triglycerides may also have high blood cholesterol levels. If you have high blood cholesterol as well as high blood triglycerides, your risk for heart disease is probably greater than if you only had high triglycerides. High blood cholesterol is one of the main risk factors for heart disease. CHANGING YOUR DIET  Your weight can affect your blood triglyceride level. If you are more than 20% above your ideal body weight, you may be able to lower your blood triglycerides by losing weight. Eating less and exercising regularly is the best way to combat this. Fat provides more  calories than any other food. The best way to lose weight is to eat less fat. Only 30% of your total calories should come from fat. Less than 7% of your diet should come from saturated fat. A diet low in fat and saturated fat is the same as a diet to decrease blood cholesterol. By eating a diet lower in fat, you may lose weight, lower your blood cholesterol, and lower your blood triglyceride level.  Eating a diet low in fat, especially saturated fat, may also help you lower your blood triglyceride level. Ask your dietitian to help you figure how much fat you can eat based on the number of calories your caregiver has prescribed for you.  Exercise, in addition to helping with weight loss may also help lower triglyceride levels.   Alcohol can increase blood triglycerides. You may need to stop drinking alcoholic beverages.  Too much carbohydrate in your diet may also increase your blood triglycerides. Some complex carbohydrates are necessary in your diet. These may include bread, rice, potatoes, other starchy vegetables and cereals.  Reduce "simple" carbohydrates. These may include pure sugars, candy, honey, and jelly without losing other nutrients. If you have the kind of high blood triglycerides that is affected by the amount of carbohydrates in your diet, you will need to eat less sugar and less high-sugar foods. Your caregiver can help you with this.  Adding 2-4 grams of fish oil (EPA+ DHA) may also help lower triglycerides. Speak with your caregiver before adding any supplements to your regimen. Following the Diet  Maintain your ideal weight. Your caregivers can help you with a diet. Generally, eating less food and getting more   exercise will help you lose weight. Joining a weight control group may also help. Ask your caregivers for a good weight control group in your area.  Eat low-fat foods instead of high-fat foods. This can help you lose weight too.  These foods are lower in fat. Eat MORE of these:    Dried beans, peas, and lentils.  Egg whites.  Low-fat cottage cheese.  Fish.  Lean cuts of meat, such as round, sirloin, rump, and flank (cut extra fat off meat you fix).  Whole grain breads, cereals and pasta.  Skim and nonfat dry milk.  Low-fat yogurt.  Poultry without the skin.  Cheese made with skim or part-skim milk, such as mozzarella, parmesan, farmers', ricotta, or pot cheese. These are higher fat foods. Eat LESS of these:   Whole milk and foods made from whole milk, such as American, blue, cheddar, monterey jack, and swiss cheese  High-fat meats, such as luncheon meats, sausages, knockwurst, bratwurst, hot dogs, ribs, corned beef, ground pork, and regular ground beef.  Fried foods. Limit saturated fats in your diet. Substituting unsaturated fat for saturated fat may decrease your blood triglyceride level. You will need to read package labels to know which products contain saturated fats.  These foods are high in saturated fat. Eat LESS of these:   Fried pork skins.  Whole milk.  Skin and fat from poultry.  Palm oil.  Butter.  Shortening.  Cream cheese.  Bacon.  Margarines and baked goods made from listed oils.  Vegetable shortenings.  Chitterlings.  Fat from meats.  Coconut oil.  Palm kernel oil.  Lard.  Cream.  Sour cream.  Fatback.  Coffee whiteners and non-dairy creamers made with these oils.  Cheese made from whole milk. Use unsaturated fats (both polyunsaturated and monounsaturated) moderately. Remember, even though unsaturated fats are better than saturated fats; you still want a diet low in total fat.  These foods are high in unsaturated fat:   Canola oil.  Sunflower oil.  Mayonnaise.  Almonds.  Peanuts.  Pine nuts.  Margarines made with these oils.  Safflower oil.  Olive oil.  Avocados.  Cashews.  Peanut butter.  Sunflower seeds.  Soybean oil.  Peanut  oil.  Olives.  Pecans.  Walnuts.  Pumpkin seeds. Avoid sugar and other high-sugar foods. This will decrease carbohydrates without decreasing other nutrients. Sugar in your food goes rapidly to your blood. When there is excess sugar in your blood, your liver may use it to make more triglycerides. Sugar also contains calories without other important nutrients.  Eat LESS of these:   Sugar, brown sugar, powdered sugar, jam, jelly, preserves, honey, syrup, molasses, pies, candy, cakes, cookies, frosting, pastries, colas, soft drinks, punches, fruit drinks, and regular gelatin.  Avoid alcohol. Alcohol, even more than sugar, may increase blood triglycerides. In addition, alcohol is high in calories and low in nutrients. Ask for sparkling water, or a diet soft drink instead of an alcoholic beverage. Suggestions for planning and preparing meals   Bake, broil, grill or roast meats instead of frying.  Remove fat from meats and skin from poultry before cooking.  Add spices, herbs, lemon juice or vinegar to vegetables instead of salt, rich sauces or gravies.  Use a non-stick skillet without fat or use no-stick sprays.  Cool and refrigerate stews and broth. Then remove the hardened fat floating on the surface before serving.  Refrigerate meat drippings and skim off fat to make low-fat gravies.  Serve more fish.  Use less butter,   margarine and other high-fat spreads on bread or vegetables.  Use skim or reconstituted non-fat dry milk for cooking.  Cook with low-fat cheeses.  Substitute low-fat yogurt or cottage cheese for all or part of the sour cream in recipes for sauces, dips or congealed salads.  Use half yogurt/half mayonnaise in salad recipes.  Substitute evaporated skim milk for cream. Evaporated skim milk or reconstituted non-fat dry milk can be whipped and substituted for whipped cream in certain recipes.  Choose fresh fruits for dessert instead of high-fat foods such as pies or  cakes. Fruits are naturally low in fat. When Dining Out   Order low-fat appetizers such as fruit or vegetable juice, pasta with vegetables or tomato sauce.  Select clear, rather than cream soups.  Ask that dressings and gravies be served on the side. Then use less of them.  Order foods that are baked, broiled, poached, steamed, stir-fried, or roasted.  Ask for margarine instead of butter, and use only a small amount.  Drink sparkling water, unsweetened tea or coffee, or diet soft drinks instead of alcohol or other sweet beverages. QUESTIONS AND ANSWERS ABOUT OTHER FATS IN THE BLOOD: SATURATED FAT, TRANS FAT, AND CHOLESTEROL What is trans fat? Trans fat is a type of fat that is formed when vegetable oil is hardened through a process called hydrogenation. This process helps makes foods more solid, gives them shape, and prolongs their shelf life. Trans fats are also called hydrogenated or partially hydrogenated oils.  What do saturated fat, trans fat, and cholesterol in foods have to do with heart disease? Saturated fat, trans fat, and cholesterol in the diet all raise the level of LDL "bad" cholesterol in the blood. The higher the LDL cholesterol, the greater the risk for coronary heart disease (CHD). Saturated fat and trans fat raise LDL similarly.  What foods contain saturated fat, trans fat, and cholesterol? High amounts of saturated fat are found in animal products, such as fatty cuts of meat, chicken skin, and full-fat dairy products like butter, whole milk, cream, and cheese, and in tropical vegetable oils such as palm, palm kernel, and coconut oil. Trans fat is found in some of the same foods as saturated fat, such as vegetable shortening, some margarines (especially hard or stick margarine), crackers, cookies, baked goods, fried foods, salad dressings, and other processed foods made with partially hydrogenated vegetable oils. Small amounts of trans fat also occur naturally in some animal  products, such as milk products, beef, and lamb. Foods high in cholesterol include liver, other organ meats, egg yolks, shrimp, and full-fat dairy products. How can I use the new food label to make heart-healthy food choices? Check the Nutrition Facts panel of the food label. Choose foods lower in saturated fat, trans fat, and cholesterol. For saturated fat and cholesterol, you can also use the Percent Daily Value (%DV): 5% DV or less is low, and 20% DV or more is high. (There is no %DV for trans fat.) Use the Nutrition Facts panel to choose foods low in saturated fat and cholesterol, and if the trans fat is not listed, read the ingredients and limit products that list shortening or hydrogenated or partially hydrogenated vegetable oil, which tend to be high in trans fat. POINTS TO REMEMBER:   Discuss your risk for heart disease with your caregivers, and take steps to reduce risk factors.  Change your diet. Choose foods that are low in saturated fat, trans fat, and cholesterol.  Add exercise to your daily routine if   it is not already being done. Participate in physical activity of moderate intensity, like brisk walking, for at least 30 minutes on most, and preferably all days of the week. No time? Break the 30 minutes into three, 10-minute segments during the day.  Stop smoking. If you do smoke, contact your caregiver to discuss ways in which they can help you quit.  Do not use street drugs.  Maintain a normal weight.  Maintain a healthy blood pressure.  Keep up with your blood work for checking the fats in your blood as directed by your caregiver. Document Released: 08/03/2004 Document Revised: 04/16/2012 Document Reviewed: 03/01/2009 [employer] Patient Information 2014 [employer].  

## 2013-10-28 ENCOUNTER — Telehealth: Payer: Self-pay | Admitting: General Practice

## 2013-10-28 LAB — CMP14+EGFR
ALT: 15 IU/L (ref 0–44)
AST: 19 IU/L (ref 0–40)
Alkaline Phosphatase: 79 IU/L (ref 39–117)
BUN/Creatinine Ratio: 21 (ref 10–22)
BUN: 65 mg/dL — ABNORMAL HIGH (ref 8–27)
CO2: 23 mmol/L (ref 18–29)
Calcium: 9.4 mg/dL (ref 8.6–10.2)
Chloride: 100 mmol/L (ref 97–108)
Creatinine, Ser: 3.11 mg/dL (ref 0.76–1.27)
GFR calc Af Amer: 24 mL/min/{1.73_m2} — ABNORMAL LOW (ref >59)
Globulin, Total: 2.8 g/dL (ref 1.5–4.5)
Potassium: 5.6 mmol/L — ABNORMAL HIGH (ref 3.5–5.2)
Sodium: 140 mmol/L (ref 134–144)

## 2013-10-28 LAB — CBC WITH DIFFERENTIAL
Basos: 1 %
Eos: 2 %
HCT: 40.4 % (ref 37.5–51.0)
Hemoglobin: 13.5 g/dL (ref 12.6–17.7)
Immature Grans (Abs): 0 10*3/uL (ref 0.0–0.1)
Immature Granulocytes: 0 %
Lymphocytes Absolute: 2.5 10*3/uL (ref 0.7–3.1)
MCH: 29.5 pg (ref 26.6–33.0)
MCV: 88 fL (ref 79–97)
Monocytes Absolute: 0.4 10*3/uL (ref 0.1–0.9)
Monocytes: 6 %
Neutrophils Absolute: 3.7 10*3/uL (ref 1.4–7.0)
Platelets: 265 10*3/uL (ref 150–379)
RBC: 4.57 x10E6/uL (ref 4.14–5.80)
RDW: 14.2 % (ref 12.3–15.4)
WBC: 6.7 10*3/uL (ref 3.4–10.8)

## 2013-10-28 LAB — NMR, LIPOPROFILE
Cholesterol: 141 mg/dL (ref <200)
HDL Cholesterol by NMR: 39 mg/dL — ABNORMAL LOW (ref >=40)
HDL Particle Number: 24.5 umol/L — ABNORMAL LOW (ref >=30.5)
LDL Particle Number: 1454 nmol/L — ABNORMAL HIGH (ref <1000)
LDL Size: 20.7 nm (ref >20.5)
LDLC SERPL CALC-MCNC: 82 mg/dL (ref <100)
LP-IR Score: 41 (ref <=45)
Triglycerides by NMR: 98 mg/dL (ref <150)

## 2013-10-29 NOTE — Telephone Encounter (Signed)
Please review for refills.

## 2013-10-31 ENCOUNTER — Ambulatory Visit (HOSPITAL_COMMUNITY)
Admission: RE | Admit: 2013-10-31 | Discharge: 2013-10-31 | Disposition: A | Discharge status: Home / Self Care | Payer: BC Managed Care – PPO | Source: Ambulatory Visit | Attending: General Practice | Admitting: General Practice

## 2013-10-31 ENCOUNTER — Other Ambulatory Visit: Payer: Self-pay | Admitting: General Practice

## 2013-10-31 ENCOUNTER — Ambulatory Visit (INDEPENDENT_AMBULATORY_CARE_PROVIDER_SITE_OTHER): Payer: BC Managed Care – PPO | Admitting: General Practice

## 2013-10-31 ENCOUNTER — Encounter: Payer: Self-pay | Admitting: General Practice

## 2013-10-31 VITALS — BP 99/64 | HR 79 | Temp 101.0°F

## 2013-10-31 DIAGNOSIS — I10 Essential (primary) hypertension: Secondary | ICD-10-CM

## 2013-10-31 DIAGNOSIS — Z20828 Contact with and (suspected) exposure to other viral communicable diseases: Secondary | ICD-10-CM

## 2013-10-31 DIAGNOSIS — R7989 Other specified abnormal findings of blood chemistry: Secondary | ICD-10-CM | POA: Insufficient documentation

## 2013-10-31 DIAGNOSIS — R799 Abnormal finding of blood chemistry, unspecified: Secondary | ICD-10-CM

## 2013-10-31 DIAGNOSIS — E119 Type 2 diabetes mellitus without complications: Secondary | ICD-10-CM | POA: Insufficient documentation

## 2013-10-31 DIAGNOSIS — N289 Disorder of kidney and ureter, unspecified: Secondary | ICD-10-CM | POA: Insufficient documentation

## 2013-10-31 DIAGNOSIS — R509 Fever, unspecified: Secondary | ICD-10-CM

## 2013-10-31 DIAGNOSIS — E875 Hyperkalemia: Secondary | ICD-10-CM

## 2013-10-31 LAB — POCT INFLUENZA A/B
INFLUENZA A, POC: NEGATIVE
Influenza B, POC: NEGATIVE

## 2013-10-31 LAB — POTASSIUM: Potassium: 5 mmol/L (ref 3.5–5.2)

## 2013-10-31 MED ORDER — LISINOPRIL 10 MG PO TABS: 10.0000 mg | ORAL_TABLET | Freq: Every day | ORAL | Status: DC

## 2013-10-31 MED ORDER — ATENOLOL 25 MG PO TABS: 25.0000 mg | ORAL_TABLET | Freq: Every day | ORAL | Status: DC

## 2013-10-31 MED ORDER — OSELTAMIVIR PHOSPHATE 75 MG PO CAPS: 75.0000 mg | ORAL_CAPSULE | Freq: Every day | ORAL | Status: DC

## 2013-10-31 MED ORDER — ATENOLOL 25 MG PO TABS
25.0000 mg | ORAL_TABLET | Freq: Every day | ORAL | Status: DC
Start: 2013-10-31 — End: 2014-03-14

## 2013-10-31 NOTE — Telephone Encounter (Signed)
Spoke with pharmacy and confirmed receipt of medications.

## 2013-10-31 NOTE — Patient Instructions (Signed)

## 2013-10-31 NOTE — Progress Notes (Signed)
   Subjective:    Patient ID: [name], male    DOB: [birth year], 63 y.o.   MRN: [id number]  HPI Patient presents today for follow blood pressure follow up. He is accompanied by wife and daughter. Denies any episodes of dizziness, since change in medication.  Discussed recent labs with patient and wife. Potassium elevated at 5.6, it is being repeated today, stat. Faxed labs to Dr. Dorris Fetch in [place], HgbA1c 10.3. Creatinine continues to rise currently 3.11, which increased from 1.66, 5 months ago. Patient wife reports he was seen by Dr. Florene Glen 2 years ago. Patient also complains of cough and body aches since yesterday. Reports exposure to flu on Thursday.     Review of Systems  Constitutional: Positive for fever and chills.  HENT: Positive for postnasal drip, rhinorrhea and sneezing. Negative for sore throat.   Respiratory: Positive for cough. Negative for chest tightness and shortness of breath.   Cardiovascular: Negative for chest pain and palpitations.  All other systems reviewed and are negative.       Objective:   Physical Exam  Constitutional: He is oriented to person, place, and time. He appears well-developed and well-nourished.  HENT:  Head: Normocephalic and atraumatic.  Right Ear: External ear normal.  Left Ear: External ear normal.  Mouth/Throat: Oropharynx is clear and moist.  Eyes: Pupils are equal, round, and reactive to light.  Neck: Normal range of motion. Neck supple.  Cardiovascular: Normal rate, regular rhythm and normal heart sounds.   Pulmonary/Chest: Effort normal and breath sounds normal. No respiratory distress. He has no rales.  Abdominal: Soft. Bowel sounds are normal. He exhibits no distension. There is no tenderness.  Neurological: He is alert and oriented to person, place, and time.  Skin: Skin is warm and dry.  Psychiatric: He has a normal mood and affect.     Results for orders placed in visit on 10/31/13  POTASSIUM      Result Value Range   Potassium 5.0  3.5 - 5.2 mmol/L  POCT INFLUENZA A/B      Result Value Range   Influenza A, POC Negative     Influenza B, POC Negative          Assessment & Plan:  1. Fever  - POCT Influenza A/B  2. Serum potassium elevated  - Potassium  3. Exposure to the flu  - oseltamivir (TAMIFLU) 75 MG capsule; Take 1 capsule (75 mg total) by mouth daily.  Dispense: 10 capsule; Refill: 0  4. Elevated serum creatinine  - US Renal; Future (patient's wife to transport patient to have renal ultrasound) - Ambulatory referral to Nephrology -informed patient and family that lab results faxed to Dr. Dorris Fetch (endocrinologist) -RTO if symptoms worsen or may seek emergency medical treatment -Patient and wife verbalized understanding Erby Pian, FNP-C

## 2013-11-04 ENCOUNTER — Telehealth: Payer: Self-pay | Admitting: General Practice

## 2013-11-04 ENCOUNTER — Ambulatory Visit (INDEPENDENT_AMBULATORY_CARE_PROVIDER_SITE_OTHER): Payer: BC Managed Care – PPO | Admitting: General Practice

## 2013-11-04 ENCOUNTER — Encounter: Payer: Self-pay | Admitting: General Practice

## 2013-11-04 VITALS — BP 109/75 | HR 65 | Temp 97.3°F

## 2013-11-04 DIAGNOSIS — I10 Essential (primary) hypertension: Secondary | ICD-10-CM

## 2013-11-04 DIAGNOSIS — I252 Old myocardial infarction: Secondary | ICD-10-CM

## 2013-11-04 DIAGNOSIS — R7989 Other specified abnormal findings of blood chemistry: Secondary | ICD-10-CM

## 2013-11-04 DIAGNOSIS — R799 Abnormal finding of blood chemistry, unspecified: Secondary | ICD-10-CM

## 2013-11-04 DIAGNOSIS — I959 Hypotension, unspecified: Secondary | ICD-10-CM

## 2013-11-04 MED ORDER — LOSARTAN POTASSIUM 50 MG PO TABS: 50.0000 mg | ORAL_TABLET | Freq: Every day | ORAL | Status: DC

## 2013-11-04 MED ORDER — FUROSEMIDE 80 MG PO TABS: 40.0000 mg | ORAL_TABLET | Freq: Every day | ORAL | Status: DC

## 2013-11-04 NOTE — Patient Instructions (Signed)
Hypotension As your heart beats, it forces blood through your arteries. This force is your blood pressure. If your blood pressure is too low for you to go about your normal activities or support the organs of your body, you have hypotension, or low blood pressure. When your blood pressure becomes too low, you may not get enough blood to your brain, and you may feel weak, lightheaded, or develop a more rapid heart rate. In a more severe case, you may faint: this is a sudden, brief loss of consciousness where you pass out and recover completely. CAUSES  Loss of blood or fluids from the body. This occurs during rapid blood loss. It can also come from dehydration when the body is not taking in enough fluids or is losing fluids faster than they can be replaced. Examples of this would be severe vomiting and diarrhea.  Not taking in enough fluids and salts. This is common in the elderly where thirst mechanisms are not working as well. This means you do not feel thirsty and you do not take in enough water.  Use of blood pressure pills and other medications that may lower the blood pressure below normal.  Over medication (always take your medications as directed).  Irregular heart beat or heart failure when the heart is no longer working well enough to support blood pressure. Hospitalization is sometimes required for low blood pressure if fluid or blood replacement is needed, if time is needed for medications to wear off, or if further evaluation is needed. Less common causes of low blood pressure might include peripheral or autonomic neuropathy (nerve problems), Parkinson's disease, or other illnesses. Treatment might include a change in diet, change in medications (including medicines aimed at raising your blood pressure), and use of support stockings. HOME CARE INSTRUCTIONS   Maintain good fluid intake and use a little more salt on your food (if you are not on a restricted diet or having problems with your  heart such as heart failure). This is especially important for the elderly when you may not feel thirsty in the winter.  Take your medications as directed.  Get up slowly from reclining or sitting positions. This gives your blood pressure a chance to adjust. As we grow older our ability to regulate our blood pressure may not be as good as when we were younger.  Wear support stockings if prescribed.  Use walkers, canes, etc., if advised.  Talk with your physician or nurse about a Home Safety Evaluation (usually done by visiting nurses). SEEK IMMEDIATE MEDICAL CARE IF:   You have a fainting episode. Do not drive yourself. Call 911 if no other help is available.  You have chest pain, nausea (feeling sick to your stomach) or vomiting.  You have a loss of feeling in some part of your body, or lose movement in your arms or legs.  You have difficulty with speech.  You become sweaty and/or feel light headed. Make sure you are re-checked as instructed. MAKE SURE YOU:   Understand these instructions.  Will watch your condition.  Will get help right away if you are not doing well or get worse. Document Released: 10/16/2005 Document Revised: 01/08/2012 Document Reviewed: 04/18/2013 [facility] Patient Information 2014 [employer].

## 2013-11-04 NOTE — Progress Notes (Signed)
   Subjective:    Patient ID: [name], male    DOB: 03/20/1951, 63 y.o.   MRN: [id number]  HPI Patient presents today for follow up of blood pressure. Blood pressures are being checked at home and ranges 82-117/48-70. Reports medications being administered as prescribed. Patient denies dizziness, but feels weak at times. Patient awaiting appointment with nephrology and cardiology.     Review of Systems  Constitutional: Negative for fever and chills.  Respiratory: Negative for chest tightness and shortness of breath.   Cardiovascular: Negative for chest pain and palpitations.  Gastrointestinal: Negative for nausea, vomiting, abdominal pain, diarrhea, constipation and blood in stool.  Neurological: Positive for weakness. Negative for dizziness and headaches.       Generalized weakness       Objective:   Physical Exam  Constitutional: He is oriented to person, place, and time. He appears well-developed and well-nourished.  HENT:  Head: Normocephalic and atraumatic.  Right Ear: External ear normal.  Left Ear: External ear normal.  Mouth/Throat: Oropharynx is clear and moist.  Neck: Normal range of motion. Neck supple. No thyromegaly present.  Cardiovascular: Normal rate, regular rhythm and normal heart sounds.   Pulmonary/Chest: Effort normal and breath sounds normal. No respiratory distress. He exhibits no tenderness.  Abdominal: Soft. Bowel sounds are normal.  Lymphadenopathy:    He has no cervical adenopathy.  Neurological: He is alert and oriented to person, place, and time.  Skin: Skin is warm and dry.  Psychiatric: He has a normal mood and affect.          Assessment & Plan:  1. Hypotension  - losartan (COZAAR) 50 MG tablet; Take 1 tablet (50 mg total) by mouth daily.  Dispense: 30 tablet; Refill: 0 -decreased from 100mg  to 50mg   2. Essential hypertension, benign  - furosemide (LASIX) 80 MG tablet; Take 0.5 tablets (40 mg total) by mouth daily.  Dispense: 30  tablet; Refill: 0 -decreased from 80mg  to 40 mg  3. Hx of myocardial infarction  - Ambulatory referral to Cardiology  4. Elevated serum creatinine  - Ambulatory referral to Nephrology -continue to monitor blood pressure and maintain diary -discussed when to seek emergency medical treatment -verbalized understanding of medication changes -RTO on Friday for recheck of blood pressure -Patient and wife verbalized understanding Erby Pian, FNP-C

## 2013-11-04 NOTE — Telephone Encounter (Signed)
appt today at 10:20

## 2013-11-07 ENCOUNTER — Ambulatory Visit (INDEPENDENT_AMBULATORY_CARE_PROVIDER_SITE_OTHER): Payer: BC Managed Care – PPO | Admitting: General Practice

## 2013-11-07 ENCOUNTER — Encounter: Payer: Self-pay | Admitting: General Practice

## 2013-11-07 VITALS — BP 97/63 | HR 72 | Temp 97.6°F

## 2013-11-07 DIAGNOSIS — I959 Hypotension, unspecified: Secondary | ICD-10-CM

## 2013-11-07 MED ORDER — LOSARTAN POTASSIUM 25 MG PO TABS: 25.0000 mg | ORAL_TABLET | Freq: Every day | ORAL | Status: DC

## 2013-11-08 DIAGNOSIS — I959 Hypotension, unspecified: Secondary | ICD-10-CM | POA: Insufficient documentation

## 2013-11-08 NOTE — Progress Notes (Signed)
   Subjective:    Patient ID: [name], male    DOB: [birth year], 63 y.o.   MRN: [id number]  HPI Patient presents today for follow up of hypotensive episodes. He denies being symptomatic. Medication are being adjusted. He is accompanied by his wife. Blood pressure at home have ranged, 87-115/50-60's. She reports patient has an appointment schedule on Monday, 11/10/13 with Dr. Florene Glen (nephrologist) for elevated creatinine.     Review of Systems  Constitutional: Negative for fever and chills.  Respiratory: Negative for chest tightness and shortness of breath.   Cardiovascular: Negative for chest pain and palpitations.  All other systems reviewed and are negative.       Objective:   Physical Exam  Constitutional: He is oriented to person, place, and time. He appears well-developed and well-nourished.  Cardiovascular: Normal rate, regular rhythm and normal heart sounds.   Pulmonary/Chest: Effort normal and breath sounds normal.  Neurological: He is alert and oriented to person, place, and time.          Assessment & Plan:  1. Hypotension -decreased losartan from 50mg  to 25mg  daily - losartan (COZAAR) 25 MG tablet; Take 1 tablet (25 mg total) by mouth daily.  Dispense: 30 tablet; Refill: 0 -continue to monitor blood pressure and maintain diary -maintain scheduled appointment with specialists (cardiology, nephrology) -follow up in 1 week for blood pressure recheck -may seek emergency medical treatment  -Patient and wife verbalized understanding Erby Pian, FNP-C

## 2013-11-08 NOTE — Patient Instructions (Signed)
Hypotension As your heart beats, it forces blood through your arteries. This force is your blood pressure. If your blood pressure is too low for you to go about your normal activities or to support the organs of your body, you have hypotension. Hypotension is also referred to as low blood pressure. When your blood pressure becomes too low, you may not get enough blood to your brain. As a result, you may feel weak, feel lightheaded, or develop a rapid heart rate. In a more severe case, you may faint. CAUSES Various conditions can cause hypotension. These include:  Blood loss.  Dehydration.  Heart or endocrine problems.  Pregnancy.  Severe infection.  Not having a well-balanced diet filled with needed nutrients.  Severe allergic reactions (anaphylaxis). Some medicines, such as blood pressure medicine or water pills (diuretics), may lower your blood pressure below normal. Sometimes taking too much medicine or taking medicine not as directed can cause hypotension. TREATMENT  Hospitalization is sometimes required for hypotension if fluid or blood replacement is needed, if time is needed for medicines to wear off, or if further monitoring is needed. Treatment might include changing your diet, changing your medicines (including medicines aimed at raising your blood pressure), and use of support stockings. HOME CARE INSTRUCTIONS   Drink enough fluids to keep your urine clear or pale yellow.  Take your medicines as directed by your health care provider.  Get up slowly from reclining or sitting positions. This gives your blood pressure a chance to adjust.  Wear support stockings as directed by your health care provider.  Maintain a healthy diet by including nutritious food, such as fruits, vegetables, nuts, whole grains, and lean meats. SEEK MEDICAL CARE IF:  You have vomiting or diarrhea.  You have a fever for more than 2 3 days.  You feel more thirsty than usual.  You feel weak and  tired. SEEK IMMEDIATE MEDICAL CARE IF:   You have chest pain or a fast or irregular heartbeat.  You have a loss of feeling in some part of your body, or you lose movement in your arms or legs.  You have trouble speaking.  You become sweaty or feel lightheaded.  You faint. MAKE SURE YOU:   Understand these instructions.  Will watch your condition.  Will get help right away if you are not doing well or get worse. Document Released: 10/16/2005 Document Revised: 08/06/2013 Document Reviewed: 04/18/2013 [employer] Patient Information 2014 [employer].  

## 2013-12-03 ENCOUNTER — Encounter: Payer: Self-pay | Admitting: Cardiology

## 2013-12-03 ENCOUNTER — Ambulatory Visit (INDEPENDENT_AMBULATORY_CARE_PROVIDER_SITE_OTHER): Payer: BC Managed Care – PPO | Admitting: Cardiology

## 2013-12-03 VITALS — BP 100/66 | HR 72 | Ht 72.0 in | Wt 245.0 lb

## 2013-12-03 DIAGNOSIS — I10 Essential (primary) hypertension: Secondary | ICD-10-CM

## 2013-12-03 DIAGNOSIS — R9430 Abnormal result of cardiovascular function study, unspecified: Secondary | ICD-10-CM

## 2013-12-03 NOTE — Patient Instructions (Signed)
The current medical regimen is effective;  continue present plan and medications.  Your physician has requested that you have a lexiscan myoview. For further information please visit [email or web address]. Please follow instruction sheet, as given.  Further follow up will be based on these results.

## 2013-12-03 NOTE — Progress Notes (Signed)
HPI The patient for followup. I saw him for a mildly abnormal stress test in the past as well as a history of syncope.  Syncope was in 2009. At that time he had a stress test demonstrating probable inferior wall abnormality with mildly reduced ejection fraction. It was a low risk scan and so he was followed with plans for risk reduction.    He is referred back for routine followup as it has been several years. He does not have any cardiovascular symptoms. He does have an amputation and prosthesis and gets around with a cane but is not particularly active. With his activities he does do he denies any chest pressure, neck or arm discomfort. He doesn't have any palpitations, presyncope or syncope. He denies any PND or orthopnea. He's had no weight gain or edema. He did have a slightly his creatinine recently and is being followed by nephrology. They thought this might of been related to some over-the-counter medications and he has followup creatinine pending. Unfortunately his blood sugar has not been well controlled as he "fell off the wagon" with his diet.  His last A1c was 10.3.  Allergies  Allergen Reactions  . Zolpidem Tartrate Other (See Comments)    disorientation     Current Outpatient Prescriptions  Medication Sig Dispense Refill  . aspirin EC 81 MG tablet Take 81 mg by mouth daily.      Marland Kitchen atenolol (TENORMIN) 25 MG tablet Take 1 tablet (25 mg total) by mouth daily.  30 tablet  0  . Calcium Carb-Cholecalciferol (CALCIUM PLUS VITAMIN D3) 600-500 MG-UNIT CAPS Take 1 capsule by mouth daily.       . cilostazol (PLETAL) 100 MG tablet Take 1 tablet (100 mg total) by mouth 2 (two) times daily.  60 tablet  3  . exenatide (BYETTA) 10 MCG/0.04ML SOLN Inject 10 mcg into the skin 2 (two) times daily with a meal.      . furosemide (LASIX) 80 MG tablet Take 0.5 tablets (40 mg total) by mouth daily.  30 tablet  0  . insulin detemir (LEVEMIR) 100 UNIT/ML injection Inject 20 Units into the skin at  bedtime.      . rosuvastatin (CRESTOR) 20 MG tablet Take 1 tablet (20 mg total) by mouth daily.  30 tablet  2   No current facility-administered medications for this visit.    Past Medical History  Diagnosis Date  . Hypertension   . Hyperlipidemia   . Type 2 Diabetes mellitus   . CAD (coronary artery disease)   . Necrosis     #2 nail     Past Surgical History  Procedure Laterality Date  . Above knee leg amputation  2004  . Cataract extraction w/phaco  09/05/2012    Procedure: CATARACT EXTRACTION PHACO AND INTRAOCULAR LENS PLACEMENT (IOC);  Surgeon: Tonny Branch, MD;  Location: [facility];  Service: Ophthalmology;  Laterality: Left;  CDE=5.45  . Cataract extraction w/phaco  10/03/2012    Procedure: CATARACT EXTRACTION PHACO AND INTRAOCULAR LENS PLACEMENT (IOC);  Surgeon: Tonny Branch, MD;  Location: [facility];  Service: Ophthalmology;  Laterality: Right;  CDE: 12.31    ROS:  As stated in the HPI and negative for all other systems.  PHYSICAL EXAM BP 100/66  Pulse 72  Ht 6' (1.829 m)  Wt 245 lb (111.131 kg)  BMI 33.22 kg/m2 GENERAL:  Well appearing HEENT:  Pupils equal round and reactive, fundi not visualized, oral mucosa unremarkable, upper dentures NECK:  No jugular venous  distention, waveform within normal limits, carotid upstroke brisk and symmetric, no bruits, no thyromegaly LYMPHATICS:  No cervical, inguinal adenopathy LUNGS:  Clear to auscultation bilaterally BACK:  No CVA tenderness CHEST:  Unremarkable HEART:  PMI not displaced or sustained,S1 and S2 within normal limits, no S3, no S4, no clicks, no rubs, no murmurs ABD:  Flat, positive bowel sounds normal in frequency in pitch, no bruits, no rebound, no guarding, no midline pulsatile mass, no hepatomegaly, no splenomegaly EXT:  2 plus pulses throughout, mild right leg edema, no cyanosis no clubbing, status post left AKA SKIN:  No rashes no nodules NEURO:  Cranial nerves II through XII grossly intact, motor grossly intact  throughout PSYCH:  Cognitively intact, oriented to person place and time   EKG:  Sinus rhythm, rate 69, axis within normal limits, intervals within normal limits, no acute ST-T wave changes.  12/03/2013   ASSESSMENT AND PLAN  ABNORMAL STRESS TEST:  Given his poorly controlled diabetes and previously abnormal stress test he needs another stress test but he would not be a walk on a treadmill. .Therefore, he will have a [place].  HTN:  The blood pressure is at target. No change in medications is indicated. We will continue with therapeutic lifestyle changes (TLC).  OBESITY:  The patient understands the need to lose weight with diet and exercise. We have discussed specific strategies for this.

## 2013-12-15 ENCOUNTER — Encounter: Payer: Self-pay | Admitting: Cardiology

## 2013-12-17 ENCOUNTER — Encounter (HOSPITAL_COMMUNITY): Payer: BC Managed Care – PPO | Attending: Cardiology | Admitting: Radiology

## 2013-12-17 VITALS — BP 119/92 | HR 105 | Ht 72.0 in | Wt 239.0 lb

## 2013-12-17 DIAGNOSIS — R9430 Abnormal result of cardiovascular function study, unspecified: Secondary | ICD-10-CM | POA: Insufficient documentation

## 2013-12-17 DIAGNOSIS — I2510 Atherosclerotic heart disease of native coronary artery without angina pectoris: Secondary | ICD-10-CM

## 2013-12-17 MED ORDER — TECHNETIUM TC 99M SESTAMIBI GENERIC - CARDIOLITE
30.0000 | Freq: Once | INTRAVENOUS | Status: AC | PRN
  Administered 2013-12-17: 30 via INTRAVENOUS

## 2013-12-17 MED ORDER — REGADENOSON 0.4 MG/5ML IV SOLN
0.4000 mg | Freq: Once | INTRAVENOUS | Status: AC
  Administered 2013-12-17: 0.4 mg via INTRAVENOUS

## 2013-12-17 MED ORDER — TECHNETIUM TC 99M SESTAMIBI GENERIC - CARDIOLITE
10.0000 | Freq: Once | INTRAVENOUS | Status: AC | PRN
  Administered 2013-12-17: 10 via INTRAVENOUS

## 2013-12-17 NOTE — Progress Notes (Signed)
[facility] [place], [place] [address] [phone]    Cardiology Nuclear Med Study  [name] is a 63 y.o. male     MRN : [id number]     DOB: 01/31/1951  Procedure Date: 12/17/2013  Nuclear Med Background Indication for Stress Test:  Evaluation for Ischemia History:  CAD, MI, MPI (scar) 2009 EF 50% Cardiac Risk Factors: Family History - CAD, Hypertension, Lipids, PVD and IDDM  Symptoms:  None indicated   Nuclear Pre-Procedure Caffeine/Decaff Intake:  None NPO After: 7:00am   Lungs:  clear O2 Sat: 96% on room air. IV 0.9% NS with Angio Cath:  22g  IV Site: R Hand  IV Started by:  Matilde Haymaker, RN  Chest Size (in):  52 Cup Size: n/a  Height: 6' (1.829 m)  Weight:  239 lb (108.41 kg)  BMI:  Body mass index is 32.41 kg/(m^2). Tech Comments: Atenolol taken 0715 today    Nuclear Med Study 1 or 2 day study: 1 day  Stress Test Type:  Lexiscan  Reading MD: n/a  Order Authorizing Provider:  Benjamin Stain  Resting Radionuclide: Technetium 48m Sestamibi  Resting Radionuclide Dose: 11.0 mCi   Stress Radionuclide:  Technetium 39m Sestamibi  Stress Radionuclide Dose: 33.0 mCi           Stress Protocol Rest HR: 105 Stress HR: 117  Rest BP: 119/52 Stress BP: 74/52  Exercise Time (min): n/a METS: n/a           Dose of Adenosine (mg):  n/a Dose of Lexiscan: 0.4 mg  Dose of Atropine (mg): n/a Dose of Dobutamine: n/a mcg/kg/min (at max HR)  Stress Test Technologist: Glade Lloyd, BS-ES  Nuclear Technologist:  Annye Rusk, CNMT     Rest Procedure:  Myocardial perfusion imaging was performed at rest 45 minutes following the intravenous administration of Technetium 48m Sestamibi. Rest ECG: NSR - Normal EKG  Stress Procedure:  The patient received IV Lexiscan 0.4 mg over 15-seconds.  Technetium 29m Sestamibi injected at 30-seconds.  Quantitative spect images were obtained after a 45 minute delay. During the infusion of Lexiscan,  the patient complained of stomach feeling queasy and fatigued. These symptoms began to resolve in recovery.  Stress ECG: No significant change from baseline ECG  QPS Raw Data Images:  Normal; no motion artifact; normal heart/lung ratio. Stress Images:  Normal homogeneous uptake in all areas of the myocardium. Rest Images:  Normal homogeneous uptake in all areas of the myocardium. Subtraction (SDS):  No evidence of ischemia. Transient Ischemic Dilatation (Normal <1.22):  1.24 Lung/Heart Ratio (Normal <0.45):  0.32  Quantitative Gated Spect Images QGS EDV:  70 ml QGS ESV:  33 ml  Impression Exercise Capacity:  Lexiscan with no exercise. BP Response:  Normal blood pressure response. Clinical Symptoms:  Felt nausea, tired. Typical with Lexiscan.  ECG Impression:  No significant ST segment change suggestive of ischemia. Comparison with Prior Nuclear Study: No images to compare  Overall Impression:  Low risk stress nuclear study with no ischemia. .  LV Ejection Fraction: 53%.  LV Wall Motion:  NL LV Function; NL Wall Motion, asynchronous contraction.   Nuc stress remains low risk when compared to prior.   Candee Furbish, MD

## 2013-12-25 ENCOUNTER — Other Ambulatory Visit: Payer: Self-pay | Admitting: Family Medicine

## 2014-01-15 ENCOUNTER — Other Ambulatory Visit: Payer: Self-pay | Admitting: *Deleted

## 2014-01-15 DIAGNOSIS — E785 Hyperlipidemia, unspecified: Secondary | ICD-10-CM

## 2014-01-15 MED ORDER — ROSUVASTATIN CALCIUM 20 MG PO TABS: 20.0000 mg | ORAL_TABLET | Freq: Every day | ORAL | Status: DC

## 2014-02-02 ENCOUNTER — Telehealth: Payer: Self-pay | Admitting: General Practice

## 2014-02-02 NOTE — Telephone Encounter (Signed)
Patient aware that the first available will be 4/21 with mae she is off several days nexy week

## 2014-02-24 ENCOUNTER — Other Ambulatory Visit: Payer: Self-pay | Admitting: Family Medicine

## 2014-03-14 ENCOUNTER — Other Ambulatory Visit: Payer: Self-pay | Admitting: Family Medicine

## 2014-04-27 ENCOUNTER — Other Ambulatory Visit: Payer: Self-pay | Admitting: General Practice

## 2014-04-28 NOTE — Telephone Encounter (Signed)
Detailed message left that patient needs to be seen in order to get refill

## 2014-04-28 NOTE — Telephone Encounter (Signed)
Last seen 11/07/13 [name]  Last lipid 10/27/13

## 2014-04-28 NOTE — Telephone Encounter (Signed)
Patient NTBS for follow up and lab work before refill crestor

## 2014-04-29 ENCOUNTER — Other Ambulatory Visit: Payer: Self-pay | Admitting: General Practice

## 2014-04-30 NOTE — Telephone Encounter (Signed)
Last seen 11/07/13 [name]  Last lipid 10/27/13

## 2014-04-30 NOTE — Telephone Encounter (Signed)
This is okay to refill x1. Patient needs to come in and get liver function test and cholesterol check

## 2014-05-15 ENCOUNTER — Encounter: Payer: Self-pay | Admitting: Family Medicine

## 2014-05-15 ENCOUNTER — Ambulatory Visit (INDEPENDENT_AMBULATORY_CARE_PROVIDER_SITE_OTHER): Payer: BC Managed Care – PPO | Admitting: Family Medicine

## 2014-05-15 VITALS — BP 101/66 | HR 61 | Temp 98.1°F | Ht 72.0 in | Wt 259.4 lb

## 2014-05-15 DIAGNOSIS — I10 Essential (primary) hypertension: Secondary | ICD-10-CM

## 2014-05-15 DIAGNOSIS — E785 Hyperlipidemia, unspecified: Secondary | ICD-10-CM

## 2014-05-15 DIAGNOSIS — E119 Type 2 diabetes mellitus without complications: Secondary | ICD-10-CM

## 2014-05-15 MED ORDER — ATENOLOL 25 MG PO TABS: 25.0000 mg | ORAL_TABLET | Freq: Every day | ORAL | Status: DC

## 2014-05-15 MED ORDER — FUROSEMIDE 40 MG PO TABS: 40.0000 mg | ORAL_TABLET | Freq: Every day | ORAL | Status: DC

## 2014-05-15 MED ORDER — ROSUVASTATIN CALCIUM 20 MG PO TABS: 20.0000 mg | ORAL_TABLET | Freq: Every day | ORAL | Status: DC

## 2014-05-15 MED ORDER — CILOSTAZOL 100 MG PO TABS: 100.0000 mg | ORAL_TABLET | Freq: Two times a day (BID) | ORAL | Status: DC

## 2014-05-15 NOTE — Progress Notes (Signed)
   Subjective:    Patient ID: [name], male    DOB: 11/23/1950, 63 y.o.   MRN: [id number]  HPI  Routine F/U.  Was to see FNP.  No complaints.  Semms to be compliant    Review of Systems  Constitutional: Negative.   HENT: Negative.   Eyes: Negative.   Respiratory: Negative.   Cardiovascular: Negative.   Gastrointestinal: Negative.   Endocrine: Negative.   Genitourinary: Negative.   Psychiatric/Behavioral: Negative.        Objective:   Physical Exam  Constitutional: He is oriented to person, place, and time. He appears well-developed and well-nourished.  HENT:  Head: Normocephalic.  Eyes: Pupils are equal, round, and reactive to light.  Neck: Normal range of motion.  Cardiovascular: Regular rhythm and normal heart sounds.   Pulmonary/Chest: Effort normal and breath sounds normal.  Abdominal: Soft.  Neurological: He is alert and oriented to person, place, and time.  Skin: Skin is warm.  Psychiatric: He has a normal mood and affect.          Assessment & Plan:  The primary encounter diagnosis was HYPERTENSION. Diagnoses of DM and HYPERLIPIDEMIA were also pertinent to this visit. .  Suspect no changes in meds Will check usual labs, A1C and BMET

## 2014-05-15 NOTE — Patient Instructions (Signed)
Diabetes and Foot Care Diabetes may cause you to have problems because of poor blood supply (circulation) to your feet and legs. This may cause the skin on your feet to become thinner, break easier, and heal more slowly. Your skin may become dry, and the skin may peel and crack. You may also have nerve damage in your legs and feet causing decreased feeling in them. You may not notice minor injuries to your feet that could lead to infections or more serious problems. Taking care of your feet is one of the most important things you can do for yourself.  HOME CARE INSTRUCTIONS  Wear shoes at all times, even in the house. Do not go barefoot. Bare feet are easily injured.  Check your feet daily for blisters, cuts, and redness. If you cannot see the bottom of your feet, use a mirror or ask someone for help.  Wash your feet with warm water (do not use hot water) and mild soap. Then pat your feet and the areas between your toes until they are completely dry. Do not soak your feet as this can dry your skin.  Apply a moisturizing lotion or petroleum jelly (that does not contain alcohol and is unscented) to the skin on your feet and to dry, brittle toenails. Do not apply lotion between your toes.  Trim your toenails straight across. Do not dig under them or around the cuticle. File the edges of your nails with an emery board or nail file.  Do not cut corns or calluses or try to remove them with medicine.  Wear clean socks or stockings every day. Make sure they are not too tight. Do not wear knee-high stockings since they may decrease blood flow to your legs.  Wear shoes that fit properly and have enough cushioning. To break in new shoes, wear them for just a few hours a day. This prevents you from injuring your feet. Always look in your shoes before you put them on to be sure there are no objects inside.  Do not cross your legs. This may decrease the blood flow to your feet.  If you find a minor scrape,  cut, or break in the skin on your feet, keep it and the skin around it clean and dry. These areas may be cleansed with mild soap and water. Do not cleanse the area with peroxide, alcohol, or iodine.  When you remove an adhesive bandage, be sure not to damage the skin around it.  If you have a wound, look at it several times a day to make sure it is healing.  Do not use heating pads or hot water bottles. They may burn your skin. If you have lost feeling in your feet or legs, you may not know it is happening until it is too late.  Make sure your health care provider performs a complete foot exam at least annually or more often if you have foot problems. Report any cuts, sores, or bruises to your health care provider immediately. SEEK MEDICAL CARE IF:   You have an injury that is not healing.  You have cuts or breaks in the skin.  You have an ingrown nail.  You notice redness on your legs or feet.  You feel burning or tingling in your legs or feet.  You have pain or cramps in your legs and feet.  Your legs or feet are numb.  Your feet always feel cold. SEEK IMMEDIATE MEDICAL CARE IF:   There is increasing redness,   swelling, or pain in or around a wound.  There is a red line that goes up your leg.  Pus is coming from a wound.  You develop a fever or as directed by your health care provider.  You notice a bad smell coming from an ulcer or wound. Document Released: 10/13/2000 Document Revised: 06/18/2013 Document Reviewed: 03/25/2013 [employer] Patient Information 2015 [employer]. This information is not intended to replace advice given to you by your health care provider. Make sure you discuss any questions you have with your health care provider.  

## 2014-06-26 ENCOUNTER — Encounter (HOSPITAL_COMMUNITY): Payer: Self-pay | Admitting: Emergency Medicine

## 2014-06-26 ENCOUNTER — Emergency Department (HOSPITAL_COMMUNITY): Payer: BC Managed Care – PPO

## 2014-06-26 ENCOUNTER — Inpatient Hospital Stay (HOSPITAL_COMMUNITY)
Admission: EM | Admit: 2014-06-26 | Discharge: 2014-06-29 | DRG: 069 | Disposition: A | Discharge status: Home / Self Care | Payer: BC Managed Care – PPO | Attending: Internal Medicine | Admitting: Internal Medicine

## 2014-06-26 DIAGNOSIS — I739 Peripheral vascular disease, unspecified: Secondary | ICD-10-CM | POA: Diagnosis present

## 2014-06-26 DIAGNOSIS — E119 Type 2 diabetes mellitus without complications: Secondary | ICD-10-CM | POA: Diagnosis not present

## 2014-06-26 DIAGNOSIS — E669 Obesity, unspecified: Secondary | ICD-10-CM | POA: Diagnosis present

## 2014-06-26 DIAGNOSIS — Z823 Family history of stroke: Secondary | ICD-10-CM

## 2014-06-26 DIAGNOSIS — G451 Carotid artery syndrome (hemispheric): Secondary | ICD-10-CM

## 2014-06-26 DIAGNOSIS — Z794 Long term (current) use of insulin: Secondary | ICD-10-CM

## 2014-06-26 DIAGNOSIS — S78119A Complete traumatic amputation at level between unspecified hip and knee, initial encounter: Secondary | ICD-10-CM

## 2014-06-26 DIAGNOSIS — Z6834 Body mass index (BMI) 34.0-34.9, adult: Secondary | ICD-10-CM

## 2014-06-26 DIAGNOSIS — G459 Transient cerebral ischemic attack, unspecified: Principal | ICD-10-CM | POA: Diagnosis present

## 2014-06-26 DIAGNOSIS — Z7982 Long term (current) use of aspirin: Secondary | ICD-10-CM

## 2014-06-26 DIAGNOSIS — G458 Other transient cerebral ischemic attacks and related syndromes: Secondary | ICD-10-CM

## 2014-06-26 DIAGNOSIS — E785 Hyperlipidemia, unspecified: Secondary | ICD-10-CM | POA: Diagnosis present

## 2014-06-26 DIAGNOSIS — G4733 Obstructive sleep apnea (adult) (pediatric): Secondary | ICD-10-CM | POA: Diagnosis present

## 2014-06-26 DIAGNOSIS — E1159 Type 2 diabetes mellitus with other circulatory complications: Secondary | ICD-10-CM | POA: Diagnosis present

## 2014-06-26 DIAGNOSIS — I10 Essential (primary) hypertension: Secondary | ICD-10-CM | POA: Diagnosis present

## 2014-06-26 DIAGNOSIS — I129 Hypertensive chronic kidney disease with stage 1 through stage 4 chronic kidney disease, or unspecified chronic kidney disease: Secondary | ICD-10-CM | POA: Diagnosis present

## 2014-06-26 DIAGNOSIS — N189 Chronic kidney disease, unspecified: Secondary | ICD-10-CM | POA: Diagnosis present

## 2014-06-26 DIAGNOSIS — G454 Transient global amnesia: Secondary | ICD-10-CM | POA: Diagnosis present

## 2014-06-26 DIAGNOSIS — N182 Chronic kidney disease, stage 2 (mild): Secondary | ICD-10-CM | POA: Diagnosis present

## 2014-06-26 HISTORY — DX: Chronic kidney disease, unspecified: N18.9

## 2014-06-26 LAB — URINALYSIS, ROUTINE W REFLEX MICROSCOPIC
BILIRUBIN URINE: NEGATIVE
Glucose, UA: NEGATIVE mg/dL
Hgb urine dipstick: NEGATIVE
KETONES UR: NEGATIVE mg/dL
Leukocytes, UA: NEGATIVE
Nitrite: NEGATIVE
PH: 6 (ref 5.0–8.0)
Protein, ur: NEGATIVE mg/dL
Specific Gravity, Urine: 1.01 (ref 1.005–1.030)
Urobilinogen, UA: 0.2 mg/dL (ref 0.0–1.0)

## 2014-06-26 LAB — RAPID URINE DRUG SCREEN, HOSP PERFORMED
Amphetamines: NOT DETECTED
BARBITURATES: NOT DETECTED
BENZODIAZEPINES: NOT DETECTED
Cocaine: NOT DETECTED
Opiates: NOT DETECTED
TETRAHYDROCANNABINOL: NOT DETECTED

## 2014-06-26 LAB — CBG MONITORING, ED: Glucose-Capillary: 82 mg/dL (ref 70–99)

## 2014-06-26 NOTE — ED Notes (Addendum)
Pt was driving and could not find his way home.  Had gone to the meeting but does not remember anything after that.  Alert, now,  Wife checked cbg 61.    MAE , has a headache.  Knows month, and his age, knows his family.

## 2014-06-26 NOTE — ED Notes (Signed)
Per family. Started at 2000. Patient was driving and unable to "make it home" family states speech was "different than normal" patient denies any pain, but states he does have a headache. Patient is alert to place, time, person and event at this time.

## 2014-06-26 NOTE — ED Provider Notes (Addendum)
CSN: [id number]     Arrival date & time 06/26/14  2236 History   First MD Initiated Contact with Patient 06/26/14 2317    This chart was scribed for Delora Fuel, MD by Terressa Koyanagi, ED Scribe. This patient was seen in room APA14/APA14 and the patient's care was started at 11:19 PM.  No chief complaint on file.  The history is provided by the patient, a relative and the spouse.   HPI Comments: [name] is a 63 y.o. male, with medical Hx noted below and significant for HTN, DM, HLD, who presents to the Emergency Department complaining of confusion onset at approximately 2000 today. Pt states "I couldn't get home"-- pt was driving and unable to"make it home." Per family member, pt also appears more "sluggish" than usual. Pt denies fever, chills, or diaphoresis and reports he is normally in good health. Pt's spouse reports pt's glucose level was 86 prior to arrival to the ED.   Past Medical History  Diagnosis Date  . Hypertension   . Hyperlipidemia   . Type 2 Diabetes mellitus   . Necrosis     #2 nail    Past Surgical History  Procedure Laterality Date  . Above knee leg amputation  2004  . Cataract extraction w/phaco  09/05/2012    Procedure: CATARACT EXTRACTION PHACO AND INTRAOCULAR LENS PLACEMENT (IOC);  Surgeon: Tonny Branch, MD;  Location: [facility];  Service: Ophthalmology;  Laterality: Left;  CDE=5.45  . Cataract extraction w/phaco  10/03/2012    Procedure: CATARACT EXTRACTION PHACO AND INTRAOCULAR LENS PLACEMENT (IOC);  Surgeon: Tonny Branch, MD;  Location: [facility];  Service: Ophthalmology;  Laterality: Right;  CDE: 12.31   Family History  Problem Relation Age of Onset  . Diabetes Mother   . Hypertension Mother   . Cancer Father   . Cancer Sister   . Sickle cell anemia Daughter    History  Substance Use Topics  . Smoking status: Never Smoker   . Smokeless tobacco: Never Used  . Alcohol Use: No    Review of Systems  Constitutional: Positive for fatigue. Negative for fever,  chills and diaphoresis.  Psychiatric/Behavioral: Positive for confusion.  All other systems reviewed and are negative.     Allergies  Zolpidem tartrate  Home Medications   Prior to Admission medications   Medication Sig Start Date End Date Taking? Authorizing Provider  aspirin EC 81 MG tablet Take 81 mg by mouth daily.    Historical Provider, MD  ASSURE COMFORT LANCETS 30G [place]  04/14/14   Historical Provider, MD  atenolol (TENORMIN) 25 MG tablet Take 1 tablet (25 mg total) by mouth daily. 05/15/14   Wardell Honour, MD  Calcium Carb-Cholecalciferol (CALCIUM PLUS VITAMIN D3) 600-500 MG-UNIT CAPS Take 1 capsule by mouth daily.     Historical Provider, MD  cilostazol (PLETAL) 100 MG tablet Take 1 tablet (100 mg total) by mouth 2 (two) times daily. 05/15/14   Wardell Honour, MD  exenatide (BYETTA) 10 MCG/0.04ML SOLN Inject 10 mcg into the skin 2 (two) times daily with a meal.    Historical Provider, MD  furosemide (LASIX) 40 MG tablet Take 1 tablet (40 mg total) by mouth daily. 05/15/14   Wardell Honour, MD  GMATE BLOOD GLUCOSE TEST test strip  04/14/14   Historical Provider, MD  insulin aspart (NOVOLOG) 100 UNIT/ML injection Inject 8 Units into the skin 3 (three) times daily before meals.    Historical Provider, MD  insulin detemir (LEVEMIR) 100  UNIT/ML injection Inject 26 Units into the skin at bedtime.     Historical Provider, MD  rosuvastatin (CRESTOR) 20 MG tablet Take 1 tablet (20 mg total) by mouth daily. 05/15/14   Wardell Honour, MD   Triage Vitals: BP 151/79  Pulse 71  Temp(Src) 98 F (36.7 C) (Oral)  Resp 16  Ht 6' (1.829 m)  Wt 256 lb (116.121 kg)  BMI 34.71 kg/m2  SpO2 96% Physical Exam  Nursing note and vitals reviewed. Constitutional: He is oriented to person, place, and time. He appears well-developed and well-nourished. No distress.  HENT:  Head: Normocephalic and atraumatic.  Eyes: Conjunctivae and EOM are normal. Pupils are equal, round, and reactive to light.   Neck: Normal range of motion. Neck supple. No JVD present. No tracheal deviation present.  No carotid bruit.  Cardiovascular: Normal rate, regular rhythm and normal heart sounds.   No murmur heard. Pulmonary/Chest: Effort normal and breath sounds normal. He has no wheezes. He has no rales.  Abdominal: Soft. Bowel sounds are normal. He exhibits no distension and no mass. There is no tenderness.  Musculoskeletal: Normal range of motion. He exhibits no edema.  Left above the knee amputation and 1+ edema  Lymphadenopathy:    He has no cervical adenopathy.  Neurological: He is alert and oriented to person, place, and time. He has normal reflexes. No cranial nerve deficit. Coordination normal.  Skin: Skin is warm and dry. No rash noted.  Psychiatric: He has a normal mood and affect. His behavior is normal. Thought content normal.    ED Course  Procedures (including critical care time) DIAGNOSTIC STUDIES: Oxygen Saturation is 96% on RA, adequate by my interpretation.    COORDINATION OF CARE: 11:24 PM-Discussed treatment plan which includes imaging and labs with pt at bedside and pt agreed to plan.   Labs Review Results for orders placed during the hospital encounter of 06/26/14  CBC WITH DIFFERENTIAL      Result Value Ref Range   WBC 7.0  4.0 - 10.5 K/uL   RBC 4.10 (*) 4.22 - 5.81 MIL/uL   Hemoglobin 12.5 (*) 13.0 - 17.0 g/dL   HCT 36.0 (*) 39.0 - 52.0 %   MCV 87.8  78.0 - 100.0 fL   MCH 30.5  26.0 - 34.0 pg   MCHC 34.7  30.0 - 36.0 g/dL   RDW 14.7  11.5 - 15.5 %   Platelets 207  150 - 400 K/uL   Neutrophils Relative % 70  43 - 77 %   Neutro Abs 4.9  1.7 - 7.7 K/uL   Lymphocytes Relative 24  12 - 46 %   Lymphs Abs 1.7  0.7 - 4.0 K/uL   Monocytes Relative 6  3 - 12 %   Monocytes Absolute 0.4  0.1 - 1.0 K/uL   Eosinophils Relative 0  0 - 5 %   Eosinophils Absolute 0.0  0.0 - 0.7 K/uL   Basophils Relative 0  0 - 1 %   Basophils Absolute 0.0  0.0 - 0.1 K/uL  COMPREHENSIVE  METABOLIC PANEL      Result Value Ref Range   Sodium 141  137 - 147 mEq/L   Potassium 3.8  3.7 - 5.3 mEq/L   Chloride 103  96 - 112 mEq/L   CO2 25  19 - 32 mEq/L   Glucose, Bld 83  70 - 99 mg/dL   BUN 37 (*) 6 - 23 mg/dL   Creatinine, Ser 2.16 (*) 0.50 -  1.35 mg/dL   Calcium 8.7  8.4 - 10.5 mg/dL   Total Protein 6.7  6.0 - 8.3 g/dL   Albumin 3.1 (*) 3.5 - 5.2 g/dL   AST 27  0 - 37 U/L   ALT 23  0 - 53 U/L   Alkaline Phosphatase 102  39 - 117 U/L   Total Bilirubin 0.3  0.3 - 1.2 mg/dL   GFR calc non Af Amer 31 (*) >90 mL/min   GFR calc Af Amer 36 (*) >90 mL/min   Anion gap 13  5 - 15  URINALYSIS, ROUTINE W REFLEX MICROSCOPIC      Result Value Ref Range   Color, Urine YELLOW  YELLOW   APPearance CLEAR  CLEAR   Specific Gravity, Urine 1.010  1.005 - 1.030   pH 6.0  5.0 - 8.0   Glucose, UA NEGATIVE  NEGATIVE mg/dL   Hgb urine dipstick NEGATIVE  NEGATIVE   Bilirubin Urine NEGATIVE  NEGATIVE   Ketones, ur NEGATIVE  NEGATIVE mg/dL   Protein, ur NEGATIVE  NEGATIVE mg/dL   Urobilinogen, UA 0.2  0.0 - 1.0 mg/dL   Nitrite NEGATIVE  NEGATIVE   Leukocytes, UA NEGATIVE  NEGATIVE  URINE RAPID DRUG SCREEN (HOSP PERFORMED)      Result Value Ref Range   Opiates NONE DETECTED  NONE DETECTED   Cocaine NONE DETECTED  NONE DETECTED   Benzodiazepines NONE DETECTED  NONE DETECTED   Amphetamines NONE DETECTED  NONE DETECTED   Tetrahydrocannabinol NONE DETECTED  NONE DETECTED   Barbiturates NONE DETECTED  NONE DETECTED  ETHANOL      Result Value Ref Range   Alcohol, Ethyl (B) <11  0 - 11 mg/dL  CBG MONITORING, ED      Result Value Ref Range   Glucose-Capillary 82  70 - 99 mg/dL   Imaging Review Dg Chest 2 View  06/27/2014   CLINICAL DATA:  Altered mental status. Weakness and headache. Disorientation.  EXAM: CHEST  2 VIEW  COMPARISON:  None.  FINDINGS: Shallow inspiration. Cardiac enlargement with normal pulmonary vascularity. Atelectasis or infiltration in the left lung base. No blunting of  costophrenic angles. No pneumothorax. Calcified and tortuous aorta. Degenerative changes in the spine.  IMPRESSION: Shallow inspiration with atelectasis or infiltration in the left lung base. Mild cardiac enlargement.   Electronically Signed   By: Lucienne Capers M.D.   On: 06/27/2014 01:03   Ct Head Wo Contrast  06/27/2014   CLINICAL DATA:  Altered mental status. Headache. Weakness. Change in speech.  EXAM: CT HEAD WITHOUT CONTRAST  TECHNIQUE: Contiguous axial images were obtained from the base of the skull through the vertex without intravenous contrast.  COMPARISON:  01/09/2011  FINDINGS: Mild cerebral atrophy. No mass effect or midline shift. No abnormal extra-axial fluid collections. Gray-white matter junctions are distinct. Basal cisterns are not effaced. No evidence of acute intracranial hemorrhage. No depressed skull fractures. Visualized paranasal sinuses and mastoid air cells are not opacified. Soft tissue hematoma over the posterior occipital region. Vascular calcifications.  IMPRESSION: No acute intracranial abnormalities.   Electronically Signed   By: Lucienne Capers M.D.   On: 06/27/2014 00:54    MDM   Final diagnoses:  Transient global amnesia    Episode of transient global amnesia which appears to have resolved although family states he is not quite back to his baseline. Workup in the ED is unremarkable. He will need MRI and MRA head as well as carotid duplex scan and echocardiogram. Case is  discussed with Dr. Darrick Meigs of [facility] who agrees to admit the patient.  I personally performed the services described in this documentation, which was scribed in my presence. The recorded information has been reviewed and is accurate.     Delora Fuel, MD [id number]  Dylan Monforte, MD [id number]

## 2014-06-27 ENCOUNTER — Inpatient Hospital Stay (HOSPITAL_COMMUNITY): Payer: BC Managed Care – PPO

## 2014-06-27 DIAGNOSIS — G459 Transient cerebral ischemic attack, unspecified: Secondary | ICD-10-CM | POA: Diagnosis present

## 2014-06-27 DIAGNOSIS — E669 Obesity, unspecified: Secondary | ICD-10-CM | POA: Diagnosis present

## 2014-06-27 DIAGNOSIS — Z7982 Long term (current) use of aspirin: Secondary | ICD-10-CM | POA: Diagnosis not present

## 2014-06-27 DIAGNOSIS — E119 Type 2 diabetes mellitus without complications: Secondary | ICD-10-CM | POA: Diagnosis present

## 2014-06-27 DIAGNOSIS — Z823 Family history of stroke: Secondary | ICD-10-CM | POA: Diagnosis not present

## 2014-06-27 DIAGNOSIS — I739 Peripheral vascular disease, unspecified: Secondary | ICD-10-CM | POA: Diagnosis present

## 2014-06-27 DIAGNOSIS — G454 Transient global amnesia: Secondary | ICD-10-CM | POA: Diagnosis present

## 2014-06-27 DIAGNOSIS — G458 Other transient cerebral ischemic attacks and related syndromes: Secondary | ICD-10-CM

## 2014-06-27 DIAGNOSIS — N189 Chronic kidney disease, unspecified: Secondary | ICD-10-CM | POA: Diagnosis present

## 2014-06-27 DIAGNOSIS — I129 Hypertensive chronic kidney disease with stage 1 through stage 4 chronic kidney disease, or unspecified chronic kidney disease: Secondary | ICD-10-CM | POA: Diagnosis present

## 2014-06-27 DIAGNOSIS — S78119A Complete traumatic amputation at level between unspecified hip and knee, initial encounter: Secondary | ICD-10-CM | POA: Diagnosis not present

## 2014-06-27 DIAGNOSIS — G4733 Obstructive sleep apnea (adult) (pediatric): Secondary | ICD-10-CM | POA: Diagnosis present

## 2014-06-27 DIAGNOSIS — Z6834 Body mass index (BMI) 34.0-34.9, adult: Secondary | ICD-10-CM | POA: Diagnosis not present

## 2014-06-27 DIAGNOSIS — E785 Hyperlipidemia, unspecified: Secondary | ICD-10-CM | POA: Diagnosis present

## 2014-06-27 DIAGNOSIS — Z794 Long term (current) use of insulin: Secondary | ICD-10-CM | POA: Diagnosis not present

## 2014-06-27 LAB — CBC WITH DIFFERENTIAL/PLATELET
Basophils Absolute: 0 10*3/uL (ref 0.0–0.1)
Basophils Relative: 0 % (ref 0–1)
Eosinophils Absolute: 0 10*3/uL (ref 0.0–0.7)
Eosinophils Relative: 0 % (ref 0–5)
HCT: 36 % — ABNORMAL LOW (ref 39.0–52.0)
HEMOGLOBIN: 12.5 g/dL — AB (ref 13.0–17.0)
LYMPHS PCT: 24 % (ref 12–46)
Lymphs Abs: 1.7 10*3/uL (ref 0.7–4.0)
MCH: 30.5 pg (ref 26.0–34.0)
MCHC: 34.7 g/dL (ref 30.0–36.0)
MCV: 87.8 fL (ref 78.0–100.0)
Monocytes Absolute: 0.4 10*3/uL (ref 0.1–1.0)
Monocytes Relative: 6 % (ref 3–12)
NEUTROS ABS: 4.9 10*3/uL (ref 1.7–7.7)
Neutrophils Relative %: 70 % (ref 43–77)
Platelets: 207 10*3/uL (ref 150–400)
RBC: 4.1 MIL/uL — AB (ref 4.22–5.81)
RDW: 14.7 % (ref 11.5–15.5)
WBC: 7 10*3/uL (ref 4.0–10.5)

## 2014-06-27 LAB — COMPREHENSIVE METABOLIC PANEL
ALBUMIN: 3.1 g/dL — AB (ref 3.5–5.2)
ALK PHOS: 102 U/L (ref 39–117)
ALT: 23 U/L (ref 0–53)
AST: 27 U/L (ref 0–37)
Anion gap: 13 (ref 5–15)
BILIRUBIN TOTAL: 0.3 mg/dL (ref 0.3–1.2)
BUN: 37 mg/dL — AB (ref 6–23)
CHLORIDE: 103 meq/L (ref 96–112)
CO2: 25 mEq/L (ref 19–32)
Calcium: 8.7 mg/dL (ref 8.4–10.5)
Creatinine, Ser: 2.16 mg/dL — ABNORMAL HIGH (ref 0.50–1.35)
GFR calc non Af Amer: 31 mL/min — ABNORMAL LOW (ref >90)
GFR, EST AFRICAN AMERICAN: 36 mL/min — AB (ref >90)
GLUCOSE: 83 mg/dL (ref 70–99)
POTASSIUM: 3.8 meq/L (ref 3.7–5.3)
Sodium: 141 mEq/L (ref 137–147)
Total Protein: 6.7 g/dL (ref 6.0–8.3)

## 2014-06-27 LAB — HEMOGLOBIN A1C
Hgb A1c MFr Bld: 6 % — ABNORMAL HIGH (ref <5.7)
Mean Plasma Glucose: 126 mg/dL — ABNORMAL HIGH (ref <117)

## 2014-06-27 LAB — GLUCOSE, CAPILLARY
GLUCOSE-CAPILLARY: 108 mg/dL — AB (ref 70–99)
GLUCOSE-CAPILLARY: 134 mg/dL — AB (ref 70–99)
Glucose-Capillary: 188 mg/dL — ABNORMAL HIGH (ref 70–99)
Glucose-Capillary: 184 mg/dL — ABNORMAL HIGH (ref 70–99)

## 2014-06-27 LAB — LIPID PANEL
CHOLESTEROL: 127 mg/dL (ref 0–200)
HDL: 57 mg/dL (ref >39)
LDL Cholesterol: 62 mg/dL (ref 0–99)
TRIGLYCERIDES: 41 mg/dL (ref <150)
Total CHOL/HDL Ratio: 2.2 RATIO
VLDL: 8 mg/dL (ref 0–40)

## 2014-06-27 LAB — CREATININE, URINE, RANDOM: CREATININE, URINE: 83.22 mg/dL

## 2014-06-27 LAB — SODIUM, URINE, RANDOM: SODIUM UR: 88 meq/L

## 2014-06-27 LAB — ETHANOL: Alcohol, Ethyl (B): <11 mg/dL (ref 0–11)

## 2014-06-27 MED ORDER — SODIUM CHLORIDE 0.9 % IJ SOLN
3.0000 mL | Freq: Two times a day (BID) | INTRAMUSCULAR | Status: DC
  Administered 2014-06-27 – 2014-06-29 (×3): 3 mL via INTRAVENOUS

## 2014-06-27 MED ORDER — ATORVASTATIN CALCIUM 40 MG PO TABS
40.0000 mg | ORAL_TABLET | Freq: Every day | ORAL | Status: DC
  Administered 2014-06-27 – 2014-06-29 (×3): 40 mg via ORAL
  Filled 2014-06-27 (×3): qty 1

## 2014-06-27 MED ORDER — SODIUM CHLORIDE 0.9 % IV SOLN
INTRAVENOUS | Status: DC
  Administered 2014-06-27 – 2014-06-29 (×4): via INTRAVENOUS
  Filled 2014-06-27: qty 1000

## 2014-06-27 MED ORDER — CILOSTAZOL 100 MG PO TABS
100.0000 mg | ORAL_TABLET | Freq: Two times a day (BID) | ORAL | Status: DC
  Administered 2014-06-27 – 2014-06-29 (×5): 100 mg via ORAL
  Filled 2014-06-27 (×7): qty 1

## 2014-06-27 MED ORDER — SODIUM CHLORIDE 0.9 % IV SOLN
250.0000 mL | INTRAVENOUS | Status: DC | PRN
  Administered 2014-06-28: 250 mL via INTRAVENOUS

## 2014-06-27 MED ORDER — INSULIN DETEMIR 100 UNIT/ML ~~LOC~~ SOLN
26.0000 [IU] | Freq: Every day | SUBCUTANEOUS | Status: DC
  Administered 2014-06-27 – 2014-06-28 (×2): 26 [IU] via SUBCUTANEOUS
  Filled 2014-06-27 (×3): qty 0.26

## 2014-06-27 MED ORDER — SODIUM CHLORIDE 0.9 % IJ SOLN: 3.0000 mL | INTRAMUSCULAR | Status: DC | PRN

## 2014-06-27 MED ORDER — ENOXAPARIN SODIUM 40 MG/0.4ML ~~LOC~~ SOLN
40.0000 mg | SUBCUTANEOUS | Status: DC
  Administered 2014-06-27 – 2014-06-29 (×3): 40 mg via SUBCUTANEOUS
  Filled 2014-06-27 (×3): qty 0.4

## 2014-06-27 MED ORDER — ASPIRIN EC 81 MG PO TBEC
81.0000 mg | DELAYED_RELEASE_TABLET | Freq: Every day | ORAL | Status: DC
  Administered 2014-06-27: 81 mg via ORAL
  Filled 2014-06-27 (×2): qty 1

## 2014-06-27 MED ORDER — INSULIN ASPART 100 UNIT/ML ~~LOC~~ SOLN
0.0000 [IU] | Freq: Three times a day (TID) | SUBCUTANEOUS | Status: DC
  Administered 2014-06-27 – 2014-06-28 (×3): 2 [IU] via SUBCUTANEOUS
  Administered 2014-06-29: 1 [IU] via SUBCUTANEOUS

## 2014-06-27 MED ORDER — STROKE: EARLY STAGES OF RECOVERY BOOK
Freq: Once | Status: AC
  Administered 2014-06-27: 08:00:00
  Filled 2014-06-27: qty 1

## 2014-06-27 NOTE — Progress Notes (Signed)
Occupational Therapy Evaluation Patient Details Name: [name] MRN: [id number] DOB: [birth year] Today's Date: 06/27/2014    History of Present Illness 63 yo with AKA who had brief episode of altered mental status while driving home and could not find his way home. Wife/friend found pt and brought him to ED. CBG 86. CT/MRI (-) for CVA.    Clinical Impression   PTA, pt Mod I with ADL and mobility and works as a Theme park manager. Pt's wife described the situation regarding her husband getting lost on his way home from a meeting, requiring her to go out and search for her husband, finding him driving around and stated that he appeared to be in a "daze". At this time, wife states that her husband does not seem to be back to his baseline. Assessed pt with the [facility] (scored 26/30)Pt demonstrated difficulty with STM (only able to recall 3/5 words after 5 min delay), visuospatial component (difficulty copying a cube - pt states he would normally be able to do this), and had difficulty with repeating numbers in reverse order. Feel pt is safe to D/C home when medically stable with 24/7 S. Rec pt refrain from driving at this time. Also recommend pt follow up at the neuro outpt center with OT to further assess visuospatial deficits and apparent ?transient topographical disorientation.     Follow Up Recommendations  Supervision/Assistance - 24 hour (initially)    Equipment Recommendations  None recommended by OT    Recommendations for Other Services       Precautions / Restrictions Precautions Precautions: Fall Precaution Comments: Pt had recent fall 1 week ago and has R knee wound. states he did not hit his head. Required Braces or Orthoses: Other Brace/Splint Other Brace/Splint: L prosthetic leg      Mobility Bed Mobility Overal bed mobility: Modified Independent                Transfers Overall transfer level: Modified independent                    Balance Overall balance assessment:  No apparent balance deficits (not formally assessed)                                          ADL Overall ADL's : At baseline                                             Vision Eye Alignment: Within Functional Limits Alignment/Gaze Preference: Within Defined Limits Ocular Range of Motion: Within Functional Limits Tracking/Visual Pursuits: Able to track stimulus in all quads without difficulty Saccades: Within functional limits Convergence: Within functional limits         Perception Perception Perception Tested?: Yes Perception Deficits: Spatial orientation;Topographical orientation Spatial deficits: Describes episode consistant with topographical disorientation. Pt states that he doesn't realy remembered what happened, but that things "didn't look familiar" and he "couldn't figure out how to get home. " Pt unable to copy a "cube" during the visuospatial test. Pt states that this normally would not be difficult for him to do.    Praxis Praxis Praxis tested?: Within functional limits    Pertinent Vitals/Pain Pain Assessment: No/denies pain     Hand Dominance Right   Extremity/Trunk Assessment Upper Extremity  Assessment Upper Extremity Assessment: Overall WFL for tasks assessed   Lower Extremity Assessment Lower Extremity Assessment: LLE deficits/detail (L AKA - has prothetic)   Cervical / Trunk Assessment Cervical / Trunk Assessment: Normal   Communication Communication Communication: No difficulties   Cognition Arousal/Alertness: Awake/alert Behavior During Therapy: Flat affect Overall Cognitive Status: Impaired/Different from baseline Area of Impairment: Memory     Memory: Decreased short-term memory (decreased delayed recall after 5 min delay. Able to recall 3)         General Comments: Assessed wit the [place]. Scored 26/30 with deficits noted with visuospatial, delayed recall and ability to repeat numbers backwards    General Comments       Exercises       Shoulder Instructions      Home Living Family/patient expects to be discharged to:: Private residence Living Arrangements: Spouse/significant other Available Help at Discharge: Family;Available 24 hours/day Type of Home: House Home Access: Ramped entrance     Home Layout: One level     Bathroom Shower/Tub: Occupational psychologist: Standard Bathroom Accessibility: Yes How Accessible: Accessible via walker;Accessible via wheelchair Home Equipment: Gilford Rile - 2 wheels;Cane - single point;Shower seat;Wheelchair - manual          Prior Functioning/Environment Level of Independence: Independent with assistive device(s)             OT Diagnosis: Cognitive deficits;Other (comment) (Perceptual deificts)   OT Problem List: Impaired vision/perception;Decreased cognition   OT Treatment/Interventions:      OT Goals(Current goals can be found in the care plan section) Acute Rehab OT Goals Patient Stated Goal: to understand what is going on  OT Frequency:     Barriers to D/C:            Co-evaluation              End of Session Equipment Utilized During Treatment: Gait belt Nurse Communication: Mobility status  Activity Tolerance: Patient tolerated treatment well Patient left: in chair;with call bell/phone within reach;with family/visitor present   Time: 1255-1340 OT Time Calculation (min): 45 min Charges:  OT General Charges $OT Visit: 1 Procedure OT Evaluation $Initial OT Evaluation Tier I: 1 Procedure OT Treatments $Therapeutic Activity: 23-37 mins G-Codes:    Galia Rahm,HILLARY Jul 24, 2014, 2:14 PM   [facility], OTR/L  [phone] 2014/07/24

## 2014-06-27 NOTE — Progress Notes (Signed)
[facility] PROGRESS NOTE  [name] [id number] DOB: Jun 13, 1951 DOA: 06/26/2014 PCP: [name], MD  Assessment/Plan: #1. Transient Global Amnesia ?? Etiology. Patient with MRI of head neg for CVA, MRA with some abnormalities. 2 D echo pending. Carotid doppler pending. Lipid panel pending. ASA.  Will consult with neurology for further evaluation and management. PT/OT.  #2 hypertension Currently stable. Blood pressure medications on hold.  #3 type 2 diabetes Check a hemoglobin A1c.CBGs have ranged from 82-188. Continue Levemir. Continue sliding scale insulin.  #4 prophylaxis Lovenox for DVT prophylaxis.   Code Status: Full Family Communication: Updated patient and wife at bedside. Disposition Plan: Home when medically stable.   Consultants:  None  Procedures:  MRI/MRA 06/27/14  CT Head 06/27/14  CXR 06/27/14    Antibiotics:  None  HPI/Subjective: Patient with no complaints.  Objective: Filed Vitals:   06/27/14 1430  BP: 111/68  Pulse: 61  Temp:   Resp: 18   No intake or output data in the 24 hours ending 06/27/14 1637 Filed Weights   06/26/14 2240 06/27/14 0500  Weight: 116.121 kg (256 lb) 113.944 kg (251 lb 3.2 oz)    Exam:   General:  NAD  Cardiovascular: RRR  Respiratory: CTAB  Abdomen: Soft/NT/ND/+BS  Musculoskeletal: nO C/C/E. S/p AKA  Data Reviewed: Basic Metabolic Panel:  Recent Labs Lab 06/26/14 2344  NA 141  K 3.8  CL 103  CO2 25  GLUCOSE 83  BUN 37*  CREATININE 2.16*  CALCIUM 8.7   Liver Function Tests:  Recent Labs Lab 06/26/14 2344  AST 27  ALT 23  ALKPHOS 102  BILITOT 0.3  PROT 6.7  ALBUMIN 3.1*   No results found for this basename: LIPASE, AMYLASE,  in the last 168 hours No results found for this basename: AMMONIA,  in the last 168 hours CBC:  Recent Labs Lab 06/26/14 2344  WBC 7.0  NEUTROABS 4.9  HGB 12.5*  HCT 36.0*  MCV 87.8  PLT 207   Cardiac Enzymes: No results found for this  basename: CKTOTAL, CKMB, CKMBINDEX, TROPONINI,  in the last 168 hours BNP (last 3 results) No results found for this basename: PROBNP,  in the last 8760 hours CBG:  Recent Labs Lab 06/26/14 2329 06/27/14 0740 06/27/14 1208 06/27/14 1624  GLUCAP 82 108* 188* 184*    No results found for this or any previous visit (from the past 240 hour(s)).   Studies: Dg Chest 2 View  06/27/2014   CLINICAL DATA:  Altered mental status. Weakness and headache. Disorientation.  EXAM: CHEST  2 VIEW  COMPARISON:  None.  FINDINGS: Shallow inspiration. Cardiac enlargement with normal pulmonary vascularity. Atelectasis or infiltration in the left lung base. No blunting of costophrenic angles. No pneumothorax. Calcified and tortuous aorta. Degenerative changes in the spine.  IMPRESSION: Shallow inspiration with atelectasis or infiltration in the left lung base. Mild cardiac enlargement.   Electronically Signed   By: Lucienne Capers M.D.   On: 06/27/2014 01:03   Ct Head Wo Contrast  06/27/2014   CLINICAL DATA:  Altered mental status. Headache. Weakness. Change in speech.  EXAM: CT HEAD WITHOUT CONTRAST  TECHNIQUE: Contiguous axial images were obtained from the base of the skull through the vertex without intravenous contrast.  COMPARISON:  01/09/2011  FINDINGS: Mild cerebral atrophy. No mass effect or midline shift. No abnormal extra-axial fluid collections. Gray-white matter junctions are distinct. Basal cisterns are not effaced. No evidence of acute intracranial hemorrhage. No depressed skull fractures. Visualized paranasal sinuses  and mastoid air cells are not opacified. Soft tissue hematoma over the posterior occipital region. Vascular calcifications.  IMPRESSION: No acute intracranial abnormalities.   Electronically Signed   By: Lucienne Capers M.D.   On: 06/27/2014 00:54   Mri Brain Without Contrast  06/27/2014   CLINICAL DATA:  Episodic memory loss. Stroke risk factors include hypertension and diabetes.  EXAM:  MRI HEAD WITHOUT CONTRAST  MRA HEAD WITHOUT CONTRAST  TECHNIQUE: Multiplanar, multiecho pulse sequences of the brain and surrounding structures were obtained without intravenous contrast. Angiographic images of the head were obtained using MRA technique without contrast.  COMPARISON:  CT head earlier in the day.  FINDINGS: MRI HEAD FINDINGS  No evidence for acute infarction, hemorrhage, mass lesion, hydrocephalus, or extra-axial fluid. Premature for age cerebral and cerebellar atrophy. Mild subcortical and periventricular T2 and FLAIR hyperintensities, likely chronic microvascular ischemic change. Flow voids are maintained throughout the carotid, basilar, and vertebral arteries. There are no areas of chronic hemorrhage. Mildly prominent perivascular spaces. No cortical infarct is evident. Pituitary, pineal, and cerebellar tonsils unremarkable. No upper cervical lesions. Visualized calvarium, skull base, and upper cervical osseous structures unremarkable. Scalp and extracranial soft tissues, orbits, sinuses, and mastoids show no acute process. BILATERAL cataract extraction.  MRA HEAD FINDINGS  The RIGHT internal carotid artery is narrowed in its inferior cavernous segment, estimated 75% stenosis. Supraclinoid RIGHT ICA and RICA terminus are widely patent.  50-75% stenosis of the cavernous and supraclinoid ICA on the LEFT. LICA terminus widely patent.  Severely diseased RIGHT A1 ACA, likely insignificant given the robust LEFT A1 ACA.  Basilar artery widely patent but dolichoectatic. Codominant vertebral arteries.  No proximal MCA stenosis. No MCA branch occlusion. Both posterior cerebral arteries widely patent. No cerebellar branch occlusion. No intracranial aneurysm.  IMPRESSION: No acute stroke.  Chronic changes as described.  Potentially flow limiting stenoses of the skull base internal carotid arteries, RIGHT greater than LEFT. No proximal MCA narrowing.   Electronically Signed   By: Rolla Flatten M.D.   On:  06/27/2014 10:59   Mr Jodene Nam Head/brain Wo Cm  06/27/2014   CLINICAL DATA:  Episodic memory loss. Stroke risk factors include hypertension and diabetes.  EXAM: MRI HEAD WITHOUT CONTRAST  MRA HEAD WITHOUT CONTRAST  TECHNIQUE: Multiplanar, multiecho pulse sequences of the brain and surrounding structures were obtained without intravenous contrast. Angiographic images of the head were obtained using MRA technique without contrast.  COMPARISON:  CT head earlier in the day.  FINDINGS: MRI HEAD FINDINGS  No evidence for acute infarction, hemorrhage, mass lesion, hydrocephalus, or extra-axial fluid. Premature for age cerebral and cerebellar atrophy. Mild subcortical and periventricular T2 and FLAIR hyperintensities, likely chronic microvascular ischemic change. Flow voids are maintained throughout the carotid, basilar, and vertebral arteries. There are no areas of chronic hemorrhage. Mildly prominent perivascular spaces. No cortical infarct is evident. Pituitary, pineal, and cerebellar tonsils unremarkable. No upper cervical lesions. Visualized calvarium, skull base, and upper cervical osseous structures unremarkable. Scalp and extracranial soft tissues, orbits, sinuses, and mastoids show no acute process. BILATERAL cataract extraction.  MRA HEAD FINDINGS  The RIGHT internal carotid artery is narrowed in its inferior cavernous segment, estimated 75% stenosis. Supraclinoid RIGHT ICA and RICA terminus are widely patent.  50-75% stenosis of the cavernous and supraclinoid ICA on the LEFT. LICA terminus widely patent.  Severely diseased RIGHT A1 ACA, likely insignificant given the robust LEFT A1 ACA.  Basilar artery widely patent but dolichoectatic. Codominant vertebral arteries.  No proximal MCA stenosis. No  MCA branch occlusion. Both posterior cerebral arteries widely patent. No cerebellar branch occlusion. No intracranial aneurysm.  IMPRESSION: No acute stroke.  Chronic changes as described.  Potentially flow limiting  stenoses of the skull base internal carotid arteries, RIGHT greater than LEFT. No proximal MCA narrowing.   Electronically Signed   By: Rolla Flatten M.D.   On: 06/27/2014 10:59    Scheduled Meds: . aspirin EC  81 mg Oral Daily  . atorvastatin  40 mg Oral q1800  . cilostazol  100 mg Oral BID  . enoxaparin (LOVENOX) injection  40 mg Subcutaneous Q24H  . insulin aspart  0-9 Units Subcutaneous TID WC  . insulin detemir  26 Units Subcutaneous QHS  . sodium chloride  3 mL Intravenous Q12H   Continuous Infusions: . sodium chloride 0.9 % 1,000 mL infusion 75 mL/hr at 06/27/14 1222    Principal Problem:   Transient global amnesia Active Problems:   DM   HYPERTENSION   TIA (transient ischemic attack)    Time spent: [place] MD [facility] Pager [phone]. If 7PM-7AM, please contact night-coverage at [email or web address], password [facility] 06/27/2014, 4:37 PM  LOS: 1 day

## 2014-06-27 NOTE — Progress Notes (Signed)
Pt transferred to unit via carelink x 3. Pt alert and oriented upon arrival. No signs or symptoms of acute distress. No complaints of pain or discomfort. Pt is connected to telemetry and central monitoring notified. Pt was oriented to room and unit procedures. Pt is resting in low bed with alarm on, and call bell within reach. Pt wife is at bedside. Will continue to monitor. Fortino Sic, RN, BSN 06/27/2014 5:42 AM

## 2014-06-27 NOTE — Evaluation (Addendum)
Physical Therapy Evaluation and Discharge  Patient Details Name: [name] MRN: [id number] DOB: [birth year] Today's Date: 06/27/2014   History of Present Illness  63 yo with AKA who had brief episode of altered mental status while driving home and could not find his way home. Wife/friend found pt and brought him to ED. CBG 86 CT/MRI (-) for CVA.   Clinical Impression  Pt adm due to above. Functionally there appear to be no focal weakness or deficits. Pt with very flat affect and may have some underlying cognition deficits from this episode. Pt and wife feel pt is at his baseline from mobility standpoint, however, pt reports "i just dont feel right". Discussed stroke education with pt and wife. Pt to benefit from OPPT for neuro/cognitive rehab. No further acute PT needs at this time. Will sign off. Thanks.     Follow Up Recommendations Other (comment);Supervision/Assistance - 24 hour (could benefit from OPPT for neuro/cognitive rehab )    Equipment Recommendations  None recommended by PT    Recommendations for Other Services       Precautions / Restrictions Precautions Precautions: Fall Precaution Comments: Pt had recent fall 1 week ago and has R knee wound. states he did not hit his head. Required Braces or Orthoses: Other Brace/Splint Other Brace/Splint: L prosthetic leg Restrictions Weight Bearing Restrictions: No      Mobility  Bed Mobility Overal bed mobility: Modified Independent                Transfers Overall transfer level: Modified independent Equipment used: Rolling walker (2 wheeled);Quad cane             General transfer comment: demo good stability and safety with transfers  Ambulation/Gait Ambulation/Gait assistance: Modified independent (Device/Increase time) Ambulation Distance (Feet): 220 Feet Assistive device: Quad cane;Rolling walker (2 wheeled) Gait Pattern/deviations: Decreased dorsiflexion - left;Step-through pattern;Decreased stance time  - left;Wide base of support (circumducts Lt LE ) Gait velocity: decreased  Gait velocity interpretation: Below normal speed for age/gender General Gait Details: able to hop to and ambulate with RW without prosthesis on; with Lt prosthesis pt is able to ambulate with QC at guarded gt speed; has some gt abnormalites that may be at this baseline; no LOB noted   Stairs            Wheelchair Mobility    Modified Rankin (Stroke Patients Only) Modified Rankin (Stroke Patients Only) Pre-Morbid Rankin Score: Moderate disability Modified Rankin: Moderate disability     Balance Overall balance assessment: History of Falls;Modified Independent                           High level balance activites: Direction changes;Head turns;Turns High Level Balance Comments: no LOB noted with high level balance              Pertinent Vitals/Pain Pain Assessment: No/denies pain    Home Living Family/patient expects to be discharged to:: Private residence Living Arrangements: Spouse/significant other Available Help at Discharge: Family;Available 24 hours/day Type of Home: House Home Access: Ramped entrance     Home Layout: One level Home Equipment: Walker - 2 wheels;Cane - single point;Shower seat;Wheelchair - manual      Prior Function Level of Independence: Independent with assistive device(s)               Hand Dominance   Dominant Hand: Right    Extremity/Trunk Assessment   Upper Extremity Assessment: Defer to OT evaluation  Lower Extremity Assessment: Overall WFL for tasks assessed      Cervical / Trunk Assessment: Normal  Communication   Communication: No difficulties  Cognition Arousal/Alertness: Awake/alert Behavior During Therapy: Flat affect Overall Cognitive Status: Impaired/Different from baseline Area of Impairment: Memory     Memory: Decreased short-term memory         General Comments: flat affect and delayed responses  noted at times; was able to navigate back to room; had some (A) from family with recall     General Comments General comments (skin integrity, edema, etc.): discussed s/s stroke and TIA; pt verbalized understanding and noted he had handouts with education; very flat affect throughout session     Exercises        Assessment/Plan    PT Assessment Patent does not need any further PT services  PT Diagnosis     PT Problem List    PT Treatment Interventions     PT Goals (Current goals can be found in the Care Plan section) Acute Rehab PT Goals Patient Stated Goal: to understand what is going on PT Goal Formulation: No goals set, d/c therapy    Frequency     Barriers to discharge        Co-evaluation               End of Session Equipment Utilized During Treatment: Gait belt Activity Tolerance: Patient tolerated treatment well Patient left: in bed;with call bell/phone within reach;with family/visitor present Nurse Communication: Mobility status         Time: [id number] PT Time Calculation (min): 19 min   Charges:   PT Evaluation $Initial PT Evaluation Tier I: 1 Procedure PT Treatments $Gait Training: 8-22 mins   PT G CodesGustavus Bradshaw, [name] 06/27/2014, 4:40 PM

## 2014-06-27 NOTE — Consult Note (Addendum)
Reason for Consult: AMS Referring Physician: Dr Grandville Silos  CC: Transient confusion  HPI: [name] is a 63 y.o. male with a history of hypertension, hyperlipidemia, and diabetes mellitus who lives in [place] [facility] [place] [facility] and works as a Theme park manager. Last night at around 8:30 PM the patient left home to pick up a few things at the store. He has lived in [place] most of his life and knows the area well; however, while going home nothing looked familiar to him and he felt lost. He knew who he was and where he was going but he could not find his way. He drove around for about an hour. At one point his wife saw him drive past their house but he did not stop. She went out to look for him and eventually found him stopped at a traffic signal. When she walked up to his car she felt he had a strange look on his face; although, he states that he recognized her immediately. He was taken to [facility] for further evaluation and subsequently transferred to [facility]. A CT scan and MRI were negative for acute findings. An MRA revealed a right internal carotid artery  narrowing in its inferior cavernous segment, estimated at 75% stenosis. The patient takes 81 mg of aspirin daily and denies missing any recent doses.. He has never had a similar episode of confusion. The family states he is normally very sharp. He denied having any weakness of the extremities during this event. His daughter felt that his voice was not normal, almost like he was speaking with a lisp. She noted no facial droop or slurred speech. His mother suffered a stroke at age 23. The patient feels completely back to normal at this time.   Past Medical History  Diagnosis Date  . Hypertension   . Hyperlipidemia   . Type 2 Diabetes mellitus   . Necrosis     #2 nail     Past Surgical History  Procedure Laterality Date  . Above knee leg amputation  2004  . Cataract extraction w/phaco  09/05/2012    Procedure: CATARACT EXTRACTION PHACO AND  INTRAOCULAR LENS PLACEMENT (IOC);  Surgeon: Tonny Branch, MD;  Location: [facility];  Service: Ophthalmology;  Laterality: Left;  CDE=5.45  . Cataract extraction w/phaco  10/03/2012    Procedure: CATARACT EXTRACTION PHACO AND INTRAOCULAR LENS PLACEMENT (IOC);  Surgeon: Tonny Branch, MD;  Location: [facility];  Service: Ophthalmology;  Laterality: Right;  CDE: 12.31    Family History  Problem Relation Age of Onset  . Diabetes Mother   . Hypertension Mother   . Cancer Father   . Cancer Sister   . Sickle cell anemia Daughter     Social History:  reports that he has never smoked. He has never used smokeless tobacco. He reports that he does not drink alcohol or use illicit drugs.  Allergies  Allergen Reactions  . Zolpidem Tartrate Other (See Comments)    disorientation     Medications:  Scheduled: . aspirin EC  81 mg Oral Daily  . atorvastatin  40 mg Oral q1800  . cilostazol  100 mg Oral BID  . enoxaparin (LOVENOX) injection  40 mg Subcutaneous Q24H  . insulin aspart  0-9 Units Subcutaneous TID WC  . insulin detemir  26 Units Subcutaneous QHS  . sodium chloride  3 mL Intravenous Q12H    ROS: History obtained from the patient  General ROS: negative for - chills, fatigue, fever, night sweats, weight gain  or weight loss Psychological ROS: negative for - behavioral disorder, hallucinations, memory difficulties, mood swings or suicidal ideation Ophthalmic ROS: negative for - blurry vision, double vision, eye pain or loss of vision ENT ROS: negative for - epistaxis, nasal discharge, oral lesions, sore throat, tinnitus or vertigo Allergy and Immunology ROS: negative for - hives or itchy/watery eyes Hematological and Lymphatic ROS: negative for - bleeding problems, bruising or swollen lymph nodes Endocrine ROS: negative for - galactorrhea, hair pattern changes, polydipsia/polyuria or temperature intolerance Respiratory ROS: negative for - cough, hemoptysis, shortness of breath or  wheezing Cardiovascular ROS: negative for - chest pain, dyspnea on exertion, edema or irregular heartbeat Gastrointestinal ROS: negative for - abdominal pain, diarrhea, hematemesis, nausea/vomiting or stool incontinence Genito-Urinary ROS: negative for - dysuria, hematuria, incontinence or urinary frequency/urgency Musculoskeletal ROS: negative for - joint swelling or muscular weakness. Positive for chronic right shoulder pain. Neurological ROS: as noted in HPI Dermatological ROS: negative for rash and skin lesion changes   Physical Examination: Blood pressure 129/76, pulse 59, temperature 98.4 F (36.9 C), temperature source Oral, resp. rate 18, height 6' (1.829 m), weight 251 lb 3.2 oz (113.944 kg), SpO2 99.00%.  Neurologic Examination  General - soft-spoken 63 year old male who appears depressed with a very flat affect.  Mental Status: Alert, oriented, thought content appropriate.  Speech fluent without evidence of aphasia.  Able to follow 3 step commands without difficulty. Cranial Nerves: II: Discs not visualized; Visual fields grossly normal, pupils equal, round pin point and sluggish. III,IV, VI: ptosis not present, extra-ocular motions intact bilaterally V,VII: smile symmetric, facial light touch sensation normal bilaterally VIII: hearing normal bilaterally IX,X: gag reflex present XI: bilateral shoulder shrug XII: midline tongue extension Motor: Right : Upper extremity   5/5    Left:     Upper extremity   5/5  Lower extremity   5/5     Lower extremity   5/5 - left AKA Tone and bulk:normal tone throughout; no atrophy noted Sensory: Pinprick and light touch intact throughout, bilaterally Deep Tendon Reflexes: 1+ and symmetric throughout Plantars: Right: downgoing   Cerebellar: normal finger-to-nose Gait: Not tested     Laboratory Studies:   Basic Metabolic Panel:  Recent Labs Lab 06/26/14 2344  NA 141  K 3.8  CL 103  CO2 25  GLUCOSE 83  BUN 37*  CREATININE  2.16*  CALCIUM 8.7    Liver Function Tests:  Recent Labs Lab 06/26/14 2344  AST 27  ALT 23  ALKPHOS 102  BILITOT 0.3  PROT 6.7  ALBUMIN 3.1*   No results found for this basename: LIPASE, AMYLASE,  in the last 168 hours No results found for this basename: AMMONIA,  in the last 168 hours  CBC:  Recent Labs Lab 06/26/14 2344  WBC 7.0  NEUTROABS 4.9  HGB 12.5*  HCT 36.0*  MCV 87.8  PLT 207    Cardiac Enzymes: No results found for this basename: CKTOTAL, CKMB, CKMBINDEX, TROPONINI,  in the last 168 hours  BNP: No components found with this basename: POCBNP,   CBG:  Recent Labs Lab 06/26/14 2329 06/27/14 0740 06/27/14 1208  GLUCAP 82 108* 188*    Microbiology: No results found for this or any previous visit.  Coagulation Studies: No results found for this basename: LABPROT, INR,  in the last 72 hours  Urinalysis:  Recent Labs Lab 06/26/14 [place]  LABSPEC 1.010  PHURINE 6.0  [place]  PROTEINUR NEGATIVE  UROBILINOGEN 0.2  NITRITE NEGATIVE  LEUKOCYTESUR NEGATIVE    Lipid Panel:     Component Value Date/Time   CHOL 127 06/27/2014 0740   TRIG 41 06/27/2014 0740   TRIG 98 10/27/2013 1305   HDL 57 06/27/2014 0740   HDL 39* 10/27/2013 1305   CHOLHDL 2.2 06/27/2014 0740   VLDL 8 06/27/2014 0740   LDLCALC 62 06/27/2014 0740   LDLCALC 82 10/27/2013 1305    HgbA1C:  Lab Results  Component Value Date   HGBA1C 10.3 10/27/2013    Urine Drug Screen:     Component Value Date/Time   LABOPIA NONE DETECTED 06/26/2014 2326   COCAINSCRNUR NONE DETECTED 06/26/2014 2326   LABBENZ NONE DETECTED 06/26/2014 2326   AMPHETMU NONE DETECTED 06/26/2014 2326   THCU NONE DETECTED 06/26/2014 2326   LABBARB NONE DETECTED 06/26/2014 2326    Alcohol Level:  Recent Labs Lab 06/26/14 2344  ETH <11    Other results: EKG: SR rate 72 BPM. Please refer to the formal report for complete  details.  Imaging:  Dg Chest 2 View 06/27/2014    Shallow inspiration with atelectasis or infiltration in the left lung base. Mild cardiac enlargement.      Ct Head Wo Contrast 06/27/2014    No acute intracranial abnormalities.   Mri Brain Without Contrast 06/27/2014    No acute stroke.  Chronic changes as described.  Potentially flow limiting stenoses of the skull base internal carotid arteries, RIGHT greater than LEFT. No proximal MCA narrowing.       Mr Jodene Nam Head/brain Wo Cm 06/27/2014    The RIGHT internal carotid artery is narrowed in its inferior cavernous segment, estimated 75% stenosis. Supraclinoid RIGHT ICA and RICA terminus are widely patent.  50-75% stenosis of the cavernous and supraclinoid ICA on the LEFT. LICA terminus widely patent.  Severely diseased RIGHT A1 ACA, likely insignificant given the robust LEFT A1 ACA.    IMPRESSION:  Potentially flow limiting stenoses of the skull base internal carotid arteries, RIGHT greater than LEFT. No proximal MCA narrowing.    Mikey Bussing PA-C [facility] Pager [phone] 06/27/2014, 3:29 PM   Patient seen and examined.  Clinical course and management discussed.  Necessary edits performed.  I agree with the above.  Assessment and plan of care developed and discussed below.    Assessment/Plan:  One episode of transient confusion - TIA versus transient global amnesia. Duration of event makes TIA more likely.  MRI of the brain reviewed and shows no acute changes.  Multiple risk factors for stroke including family history, diabetes, peripheral vascular disease, hypertension, obesity, hyperlipidemia, and 75% right internal carotid artery stenosis. Area of stenosis not amenable to surgical intervention.  May consider stent placement but will need a renal clearance.    Recommendations: 1.  Plavix 75mg  daily to be taken along with ASA 2.  IR contacted and will make follow up appointment for patient at discharge.  3.   Awaiting echo results   Alexis Goodell, MD [facility] [phone]  06/27/2014  4:34 PM

## 2014-06-27 NOTE — H&P (Signed)
PCP:   [name], MD   Chief Complaint:  Altered mental status  HPI:  63 year old male who  has a past medical history of Hypertension; Hyperlipidemia; Type 2 Diabetes mellitus; and Necrosis. Today came to the ED after patient had an episode of brief altered mental status while driving home around 8 PM. As per patient, he could not get home patient was driving and able to make it home. Patient says that he forgot the way, and as noted on the would have after that. At home wife checked CBG was 86. He denies passing out, no slurred speech no focal weakness, no nausea vomiting or diarrhea no chest pain shortness of breath. Patient did not have similar episode in the past. In the ED CT head was negative for stroke. Patient is back to his baseline.  Allergies:   Allergies  Allergen Reactions  . Zolpidem Tartrate Other (See Comments)    disorientation       Past Medical History  Diagnosis Date  . Hypertension   . Hyperlipidemia   . Type 2 Diabetes mellitus   . Necrosis     #2 nail     Past Surgical History  Procedure Laterality Date  . Above knee leg amputation  2004  . Cataract extraction w/phaco  09/05/2012    Procedure: CATARACT EXTRACTION PHACO AND INTRAOCULAR LENS PLACEMENT (IOC);  Surgeon: Tonny Branch, MD;  Location: [facility];  Service: Ophthalmology;  Laterality: Left;  CDE=5.45  . Cataract extraction w/phaco  10/03/2012    Procedure: CATARACT EXTRACTION PHACO AND INTRAOCULAR LENS PLACEMENT (IOC);  Surgeon: Tonny Branch, MD;  Location: [facility];  Service: Ophthalmology;  Laterality: Right;  CDE: 12.31    Prior to Admission medications   Medication Sig Start Date End Date Taking? Authorizing Provider  aspirin EC 81 MG tablet Take 81 mg by mouth daily.    Historical Provider, MD  ASSURE COMFORT LANCETS 30G [place]  04/14/14   Historical Provider, MD  atenolol (TENORMIN) 25 MG tablet Take 1 tablet (25 mg total) by mouth daily. 05/15/14   Wardell Honour, MD  Calcium  Carb-Cholecalciferol (CALCIUM PLUS VITAMIN D3) 600-500 MG-UNIT CAPS Take 1 capsule by mouth daily.     Historical Provider, MD  cilostazol (PLETAL) 100 MG tablet Take 1 tablet (100 mg total) by mouth 2 (two) times daily. 05/15/14   Wardell Honour, MD  exenatide (BYETTA) 10 MCG/0.04ML SOLN Inject 10 mcg into the skin 2 (two) times daily with a meal.    Historical Provider, MD  furosemide (LASIX) 40 MG tablet Take 1 tablet (40 mg total) by mouth daily. 05/15/14   Wardell Honour, MD  GMATE BLOOD GLUCOSE TEST test strip  04/14/14   Historical Provider, MD  insulin aspart (NOVOLOG) 100 UNIT/ML injection Inject 8 Units into the skin 3 (three) times daily before meals.    Historical Provider, MD  insulin detemir (LEVEMIR) 100 UNIT/ML injection Inject 26 Units into the skin at bedtime.     Historical Provider, MD  rosuvastatin (CRESTOR) 20 MG tablet Take 1 tablet (20 mg total) by mouth daily. 05/15/14   Wardell Honour, MD    Social History:  reports that he has never smoked. He has never used smokeless tobacco. He reports that he does not drink alcohol or use illicit drugs.  Family History  Problem Relation Age of Onset  . Diabetes Mother   . Hypertension Mother   . Cancer Father   . Cancer Sister   .  Sickle cell anemia Daughter      All the positives are listed in BOLD  Review of Systems:  HEENT: Headache, blurred vision, runny nose, sore throat Neck: Hypothyroidism, hyperthyroidism,,lymphadenopathy Chest : Shortness of breath, history of COPD, Asthma Heart : Chest pain, history of coronary arterey disease GI:  Nausea, vomiting, diarrhea, constipation, GERD GU: Dysuria, urgency, frequency of urination, hematuria Neuro: Stroke, seizures, syncope Psych: Depression, anxiety, hallucinations   Physical Exam: Blood pressure 123/89, pulse 69, temperature 98 F (36.7 C), temperature source Oral, resp. rate 16, height 6' (1.829 m), weight 116.121 kg (256 lb), SpO2 97.00%. Constitutional:    Patient is a well-developed and well-nourished  imalen no acute distress and cooperative with exam. Head: Normocephalic and atraumatic Mouth: Mucus membranes moist Eyes: PERRL, EOMI, conjunctivae normal Neck: Supple, No Thyromegaly Cardiovascular: RRR, S1 normal, S2 normal Pulmonary/Chest: CTAB, no wheezes, rales, or rhonchi Abdominal: Soft. Non-tender, non-distended, bowel sounds are normal, no masses, organomegaly, or guarding present.  Neurological: A&O x3, Strenght is normal and symmetric bilaterally, cranial nerve II-XII are grossly intact, no focal motor deficit, sensory intact to light touch bilaterally.  Extremities : No Cyanosis, Clubbing or Edema  Labs on Admission:  Basic Metabolic Panel:  Recent Labs Lab 06/26/14 2344  NA 141  K 3.8  CL 103  CO2 25  GLUCOSE 83  BUN 37*  CREATININE 2.16*  CALCIUM 8.7   Liver Function Tests:  Recent Labs Lab 06/26/14 2344  AST 27  ALT 23  ALKPHOS 102  BILITOT 0.3  PROT 6.7  ALBUMIN 3.1*   No results found for this basename: LIPASE, AMYLASE,  in the last 168 hours No results found for this basename: AMMONIA,  in the last 168 hours CBC:  Recent Labs Lab 06/26/14 2344  WBC 7.0  NEUTROABS 4.9  HGB 12.5*  HCT 36.0*  MCV 87.8  PLT 207   Cardiac Enzymes: No results found for this basename: CKTOTAL, CKMB, CKMBINDEX, TROPONINI,  in the last 168 hours  BNP (last 3 results) No results found for this basename: PROBNP,  in the last 8760 hours CBG:  Recent Labs Lab 06/26/14 2329  GLUCAP 82    Radiological Exams on Admission: Dg Chest 2 View  06/27/2014   CLINICAL DATA:  Altered mental status. Weakness and headache. Disorientation.  EXAM: CHEST  2 VIEW  COMPARISON:  None.  FINDINGS: Shallow inspiration. Cardiac enlargement with normal pulmonary vascularity. Atelectasis or infiltration in the left lung base. No blunting of costophrenic angles. No pneumothorax. Calcified and tortuous aorta. Degenerative changes in the  spine.  IMPRESSION: Shallow inspiration with atelectasis or infiltration in the left lung base. Mild cardiac enlargement.   Electronically Signed   By: Lucienne Capers M.D.   On: 06/27/2014 01:03   Ct Head Wo Contrast  06/27/2014   CLINICAL DATA:  Altered mental status. Headache. Weakness. Change in speech.  EXAM: CT HEAD WITHOUT CONTRAST  TECHNIQUE: Contiguous axial images were obtained from the base of the skull through the vertex without intravenous contrast.  COMPARISON:  01/09/2011  FINDINGS: Mild cerebral atrophy. No mass effect or midline shift. No abnormal extra-axial fluid collections. Gray-white matter junctions are distinct. Basal cisterns are not effaced. No evidence of acute intracranial hemorrhage. No depressed skull fractures. Visualized paranasal sinuses and mastoid air cells are not opacified. Soft tissue hematoma over the posterior occipital region. Vascular calcifications.  IMPRESSION: No acute intracranial abnormalities.   Electronically Signed   By: Lucienne Capers M.D.   On: 06/27/2014 00:54  Assessment/Plan Principal Problem:   TIA (transient ischemic attack) Active Problems:   DM   HYPERTENSION   Transient global amnesia  TIA  Patient likely had TIA, will admit the patient to [facility] as no MRI is available here over the weekend. We'll continue with aspirin 81 mg by mouth daily, atorvastatin. Will check echocardiogram and carotid duplex. Follow him on an A1c and lipid profile in a.m.   Diabetes mellitus Will continue long-acting insulin levemir, and start sliding scale insulin NovoLog  Hypertension Will hold atenolol and Lasix at this time for permissive hypertension.    Code status: full code   Family discussion: Admission, patients condition and plan of care including tests being ordered have been discussed with the patient and *his wife at bedside who indicate understanding and agree with the plan and Code Status.   Time Spent on Admission:  60  minutes  [facility] Pager: [phone] 06/27/2014, 2:39 AM  If 7PM-7AM, please contact night-coverage  [email or web address]  Password [email or web address]

## 2014-06-27 NOTE — Progress Notes (Signed)
VASCULAR LAB PRELIMINARY  PRELIMINARY  PRELIMINARY  PRELIMINARY  Carotid Dopplers completed.    Preliminary report:  1-39% ICA stenosis.  Vertebral artery flow is antegrade.   Jasani Dolney, RVT 06/27/2014, 11:36 AM

## 2014-06-28 ENCOUNTER — Encounter (HOSPITAL_COMMUNITY): Payer: Self-pay | Admitting: Internal Medicine

## 2014-06-28 DIAGNOSIS — I369 Nonrheumatic tricuspid valve disorder, unspecified: Secondary | ICD-10-CM

## 2014-06-28 DIAGNOSIS — G454 Transient global amnesia: Secondary | ICD-10-CM

## 2014-06-28 DIAGNOSIS — N182 Chronic kidney disease, stage 2 (mild): Secondary | ICD-10-CM | POA: Diagnosis present

## 2014-06-28 DIAGNOSIS — N189 Chronic kidney disease, unspecified: Secondary | ICD-10-CM

## 2014-06-28 HISTORY — DX: Chronic kidney disease, unspecified: N18.9

## 2014-06-28 LAB — BASIC METABOLIC PANEL
ANION GAP: 12 (ref 5–15)
BUN: 28 mg/dL — ABNORMAL HIGH (ref 6–23)
CALCIUM: 8.4 mg/dL (ref 8.4–10.5)
CO2: 21 mEq/L (ref 19–32)
Chloride: 109 mEq/L (ref 96–112)
Creatinine, Ser: 1.67 mg/dL — ABNORMAL HIGH (ref 0.50–1.35)
GFR calc Af Amer: 49 mL/min — ABNORMAL LOW (ref >90)
GFR calc non Af Amer: 42 mL/min — ABNORMAL LOW (ref >90)
GLUCOSE: 104 mg/dL — AB (ref 70–99)
POTASSIUM: 4.3 meq/L (ref 3.7–5.3)
SODIUM: 142 meq/L (ref 137–147)

## 2014-06-28 LAB — GLUCOSE, CAPILLARY
Glucose-Capillary: 87 mg/dL (ref 70–99)
Glucose-Capillary: 119 mg/dL — ABNORMAL HIGH (ref 70–99)
Glucose-Capillary: 153 mg/dL — ABNORMAL HIGH (ref 70–99)
Glucose-Capillary: 130 mg/dL — ABNORMAL HIGH (ref 70–99)

## 2014-06-28 MED ORDER — CLOPIDOGREL BISULFATE 75 MG PO TABS
75.0000 mg | ORAL_TABLET | Freq: Every day | ORAL | Status: DC
  Administered 2014-06-28 – 2014-06-29 (×2): 75 mg via ORAL
  Filled 2014-06-28 (×2): qty 1

## 2014-06-28 NOTE — Progress Notes (Signed)
  Echocardiogram 2D Echocardiogram has been performed.  [name] 06/28/2014, 1:01 PM

## 2014-06-28 NOTE — Progress Notes (Signed)
Utilization Review Completed.   Jj Enyeart, RN, BSN Nurse Case Manager  

## 2014-06-28 NOTE — Progress Notes (Addendum)
STROKE TEAM PROGRESS NOTE   HISTORY [name] is a 63 y.o. male with a history of hypertension, hyperlipidemia, and diabetes mellitus who lives in [facility] and works as a Theme park manager. Last night at around 8:30 PM the patient left home to pick up a few things at the store. He has lived in [place] most of his life and knows the area well; however, while going home nothing looked familiar to him and he felt lost. He knew who he was and where he was going but he could not find his way. He drove around for about an hour. At one point his wife saw him drive past their house but he did not stop. She went out to look for him and eventually found him stopped at a traffic signal. When she walked up to his car she felt he had a strange look on his face; although, he states that he recognized her immediately. He was taken to [facility] for further evaluation and subsequently transferred to [facility]. A CT scan and MRI were negative for acute findings. An MRA revealed a right internal carotid artery narrowing in its inferior cavernous segment, estimated at 75% stenosis. The patient takes 81 mg of aspirin daily and denies missing any recent doses.. He has never had a similar episode of confusion. The family states he is normally very sharp. He denied having any weakness of the extremities during this event. His daughter felt that his voice was not normal, almost like he was speaking with a lisp. She noted no facial droop or slurred speech. His mother suffered a stroke at age 84. The patient feels completely back to normal at this time.   Patient was not administered TPA secondary to resolution of deficits.. Probable TIA.   SUBJECTIVE (INTERVAL HISTORY) No new complaints are reported. He has had no recurrent spells. He reports being at baseline. He reports that he did have a mild headache when the spell on yesterday. He does not typically have episodic headaches.    OBJECTIVE Temp:  [98 F  (36.7 C)-98.4 F (36.9 C)] 98 F (36.7 C) (08/30 0700) Pulse Rate:  [57-73] 57 (08/30 0700) Cardiac Rhythm:  [-] Normal sinus rhythm (08/29 2005) Resp:  [18-20] 20 (08/30 0700) BP: (101-129)/(58-80) 101/58 mmHg (08/30 0700) SpO2:  [96 %-100 %] 100 % (08/30 0700)   Recent Labs Lab 06/27/14 0740 06/27/14 1208 06/27/14 1624 06/27/14 2154 06/28/14 0652  GLUCAP 108* 188* 184* 134* 87    Recent Labs Lab 06/26/14 2344 06/28/14 0400  NA 141 142  K 3.8 4.3  CL 103 109  CO2 25 21  GLUCOSE 83 104*  BUN 37* 28*  CREATININE 2.16* 1.67*  CALCIUM 8.7 8.4    Recent Labs Lab 06/26/14 2344  AST 27  ALT 23  ALKPHOS 102  BILITOT 0.3  PROT 6.7  ALBUMIN 3.1*    Recent Labs Lab 06/26/14 2344  WBC 7.0  NEUTROABS 4.9  HGB 12.5*  HCT 36.0*  MCV 87.8  PLT 207   No results found for this basename: CKTOTAL, CKMB, CKMBINDEX, TROPONINI,  in the last 168 hours No results found for this basename: LABPROT, INR,  in the last 72 hours  Recent Labs  06/26/14 2326  COLORURINE YELLOW  LABSPEC 1.010  PHURINE 6.0  GLUCOSEU NEGATIVE  HGBUR NEGATIVE  BILIRUBINUR NEGATIVE  KETONESUR NEGATIVE  PROTEINUR NEGATIVE  UROBILINOGEN 0.2  NITRITE NEGATIVE  LEUKOCYTESUR NEGATIVE       Component Value  Date/Time   CHOL 127 06/27/2014 0740   TRIG 41 06/27/2014 0740   TRIG 98 10/27/2013 1305   HDL 57 06/27/2014 0740   HDL 39* 10/27/2013 1305   CHOLHDL 2.2 06/27/2014 0740   VLDL 8 06/27/2014 0740   LDLCALC 62 06/27/2014 0740   LDLCALC 82 10/27/2013 1305   Lab Results  Component Value Date   HGBA1C 6.0* 06/27/2014      Component Value Date/Time   LABOPIA NONE DETECTED 06/26/2014 2326   COCAINSCRNUR NONE DETECTED 06/26/2014 2326   LABBENZ NONE DETECTED 06/26/2014 2326   AMPHETMU NONE DETECTED 06/26/2014 2326   THCU NONE DETECTED 06/26/2014 2326   LABBARB NONE DETECTED 06/26/2014 2326     Recent Labs Lab 06/26/14 2344  ETH <11    Dg Chest 2 View 06/27/2014    Shallow inspiration with  atelectasis or infiltration in the left lung base. Mild cardiac enlargement.      Ct Head Wo Contrast 06/27/2014    No acute intracranial abnormalities.    Mri Brain Without Contrast 06/27/2014    No acute stroke.  Chronic changes as described.  Potentially flow limiting stenoses of the skull base internal carotid arteries, RIGHT greater than LEFT. No proximal MCA narrowing.      Mr Jodene Nam Head/brain Wo Cm 06/27/2014    No acute stroke.  Chronic changes as described.  Potentially flow limiting stenoses of the skull base internal carotid arteries, RIGHT greater than LEFT. No proximal MCA narrowing.      ECHO - Left ventricle: The cavity size was normal. Systolic function was vigorous. The estimated ejection fraction was in the range of 65% to 70%. Wall motion was normal; there were no regional wall motion abnormalities. Features are consistent with a pseudonormal left ventricular filling pattern, with concomitant abnormal relaxation and increased filling pressure (grade 2 diastolic dysfunction). There was no evidence of elevated ventricular filling pressure by Doppler parameters. - Aortic valve: There was no significant regurgitation. - Aortic root: The aortic root was normal in size. - Left atrium: The atrium was mildly dilated. - Right ventricle: Systolic function was normal. - Right atrium: The atrium was normal in size. - Tricuspid valve: There was mild regurgitation. - Pulmonic valve: There was no regurgitation. - Pulmonary arteries: Systolic pressure was within the normal range.  Impressions:  - Normal left ventricular size and systolic function   PHYSICAL EXAM  GENERAL:  Very pleasant man in no acute distress.  HEENT: Supple. Atraumatic normocephalic.   ABDOMEN: soft  EXTREMITIES: No edema. Left above-the-knee amputation due to diabetes   BACK: Normal.  SKIN: Normal by inspection.    MENTAL STATUS: Alert and oriented. Speech, language and cognition are generally  intact. Judgment and insight normal.   CRANIAL NERVES: Pupils are equal, round and reactive to light and accommodation; extra ocular movements are full, there is no significant nystagmus; visual fields are full on R, but field cut left eye; upper and lower facial muscles are normal in strength and symmetric, there is no flattening of the nasolabial folds; tongue is midline; uvula is midline; shoulder elevation is normal.  MOTOR: Normal tone, bulk and strength; no pronator drift.  COORDINATION: Left finger to nose is normal, right finger to nose is normal, No rest tremor; no intention tremor; no postural tremor; no bradykinesia.  SENSATION: Normal to light touch.     ASSESSMENT/PLAN  Mr. VASU CURLES is a 63 y.o. male with hx of hypertension, hyperlipidemia, and diabetes mellitus  presenting with transient  confusion. Etiology is unclear but the differential diagnosis includes TIA, transient goal amnesia and complex partial seizures. Also consider psychosomatic disorders. He did not receive IV t-PA as deficits had resolved. Imaging confirms no infarct.   Probable TIA:   aspirin 81 mg orally every day prior to admission, now on Plavix 75 mg daily.   MRI - No acute stroke.  MRA - Potentially flow limiting stenoses of the skull base internal carotid arteries, RIGHT greater than LEFT. Would not recommend interventional Therapies at this point.  2D Echo - SEE above  Carotid - Pending  LDL 62 - Crestor PTA.  HgbA1c - 6.0 WNL  Lovenox for VTE prophylaxis  Carb Control thin liquids.   Bathroom privileges with assistance  Resultant - resolution of deficits.  Therapy needs:  No follow up therapy  Risk factor management/education  Patient counseled to be compliant with his antithrombotic medications  Disposition:  Pending  EEG   Hypertension   Home meds:  Tenormin. Not resumed in hospital  BP  (101-129)/(58-80) 101/58 mmHg (08/30 0700) past 24h  SBP goal  <180  Stable  Patient counseled to be compliant with his blood pressure medications  Hyperlipidemia  Home meds: Crestor - now on Lipitor  LDL 62  LDL goal < 100 (<70 for diabetics)  Diabetes  Home meds:  NovoLog and Levemir insulin  HgbA1c 6.0  Controlled  Goal < 7.0  Educated patient about lifestyle changes for diabetes prevention  Other Stroke Risk Factors Advanced age   Obesity, Body mass index is 34.06 kg/(m^2).    Family hx stroke (patient's mother had a stroke at age 65)   Other Active Problems  Renal insufficiency  Potentially flow limiting stenoses of the skull base internal carotid arteries, RIGHT greater than LEFT.  Check EEG.  Other Pertinent History  Peripheral vascular disease  Hospital day # 2  Mikey Bussing PA-C [facility] Pager [phone] 06/28/2014, 1:04 PM  The patient was seen and examined by me; notes, chart and tests reviewed and discussed with midlevel provider, other providers, patient, and family.      To contact Stroke Continuity provider, please refer to [email or web address] After hours, contact General Neurology

## 2014-06-28 NOTE — Care Management Note (Signed)
    Page 1 of 1   06/28/2014     2:07:21 PM CARE MANAGEMENT NOTE 06/28/2014  Patient:  GARRITT, MOLYNEUX   Account Number:  [id number]  Date Initiated:  06/28/2014  Documentation initiated by:  [facility]  Subjective/Objective Assessment:   adm: transient global amnesia     Action/Plan:   outpt PT/OT   Anticipated DC Date:  06/28/2014   Anticipated DC Plan:        DC Planning Services  CM consult  OP Neuro Rehab      Choice offered to / List presented to:             Status of service:   Medicare Important Message given?   (If response is "NO", the following Medicare IM given date fields will be blank) Date Medicare IM given:   Medicare IM given by:   Date Additional Medicare IM given:   Additional Medicare IM given by:    Discharge Disposition:    Per UR Regulation:    If discussed at Long Length of Stay Meetings, dates discussed:    Comments:  14:00 Cm met with pt and gave pt map and telephone number of [place].  [facility] to [facility] appointiment.  Per MD order, CM faxed referral to the [facility] for OutPT/OT.  Fax included order, PT/OT evals, H&P and Neuro Consult note.  No other CM needs were communicated.  Mariane Masters, BSN, CM [phone].

## 2014-06-28 NOTE — Progress Notes (Signed)
[facility] PROGRESS NOTE  [name] [id number] DOB: 1951/03/26 DOA: 06/26/2014 PCP: [name], MD  Assessment/Plan: #1. Probable TIA vs Transient Global Amnesia ?? Etiology. Patient with MRI of head neg for CVA, MRA with potentially flow-limiting stenosis of the skull base internal carotid arteries right greater than left. 2-D echo with no source of emboli. Carotid Dopplers preliminary readings negative for any significant ICA stenosis. LDL is 62 patient on a statin prior to admission. Patient was on aspirin prior to admission which has been changed to Plavix 75 mg daily for secondary stroke prevention. Patient is being seen by neurology and recommended an EEG at this time. Neurology has contacted interventional radiology, Dr. Marjory Lies for outpatient followup. Neurology following and appreciate input and recommendations.  #2 hypertension Currently stable. Blood pressure medications on hold.  #3 well controlled type 2 diabetes Hemoglobin A1c = 6.0. CBGs have ranged from 87-134. Continue Levemir. Continue sliding scale insulin.  #4 chronic kidney disease Stable. Decrease IV fluids. Follow.  #5 prophylaxis Lovenox for DVT prophylaxis.   Code Status: Full Family Communication: Updated patient and wife at bedside. Disposition Plan: Home when medically stable, hopefully tomorrow   Consultants:  Neurology: Dr. Doy Mince 06/27/2014  Procedures:  MRI/MRA 06/27/14  CT Head 06/27/14  CXR 06/27/14  2-D echo 06/28/2014  Carotid Dopplers 06/27/2014  Antibiotics:  None  HPI/Subjective: Patient with no complaints. No further spells.  Objective: Filed Vitals:   06/28/14 0958  BP: 121/74  Pulse: 63  Temp: 97.7 F (36.5 C)  Resp: 20    Intake/Output Summary (Last 24 hours) at 06/28/14 1423 Last data filed at 06/27/14 1753  Gross per 24 hour  Intake    240 ml  Output      0 ml  Net    240 ml   Filed Weights   06/26/14 2240 06/27/14 0500  Weight: 116.121  kg (256 lb) 113.944 kg (251 lb 3.2 oz)    Exam:   General:  NAD  Cardiovascular: RRR  Respiratory: CTAB  Abdomen: Soft/NT/ND/+BS  Musculoskeletal: nO C/C/E. S/p AKA  Data Reviewed: Basic Metabolic Panel:  Recent Labs Lab 06/26/14 2344 06/28/14 0400  NA 141 142  K 3.8 4.3  CL 103 109  CO2 25 21  GLUCOSE 83 104*  BUN 37* 28*  CREATININE 2.16* 1.67*  CALCIUM 8.7 8.4   Liver Function Tests:  Recent Labs Lab 06/26/14 2344  AST 27  ALT 23  ALKPHOS 102  BILITOT 0.3  PROT 6.7  ALBUMIN 3.1*   No results found for this basename: LIPASE, AMYLASE,  in the last 168 hours No results found for this basename: AMMONIA,  in the last 168 hours CBC:  Recent Labs Lab 06/26/14 2344  WBC 7.0  NEUTROABS 4.9  HGB 12.5*  HCT 36.0*  MCV 87.8  PLT 207   Cardiac Enzymes: No results found for this basename: CKTOTAL, CKMB, CKMBINDEX, TROPONINI,  in the last 168 hours BNP (last 3 results) No results found for this basename: PROBNP,  in the last 8760 hours CBG:  Recent Labs Lab 06/27/14 1208 06/27/14 1624 06/27/14 2154 06/28/14 0652 06/28/14 1148  GLUCAP 188* 184* 134* 87 119*    No results found for this or any previous visit (from the past 240 hour(s)).   Studies: Dg Chest 2 View  06/27/2014   CLINICAL DATA:  Altered mental status. Weakness and headache. Disorientation.  EXAM: CHEST  2 VIEW  COMPARISON:  None.  FINDINGS: Shallow inspiration. Cardiac enlargement with normal pulmonary  vascularity. Atelectasis or infiltration in the left lung base. No blunting of costophrenic angles. No pneumothorax. Calcified and tortuous aorta. Degenerative changes in the spine.  IMPRESSION: Shallow inspiration with atelectasis or infiltration in the left lung base. Mild cardiac enlargement.   Electronically Signed   By: Lucienne Capers M.D.   On: 06/27/2014 01:03   Ct Head Wo Contrast  06/27/2014   CLINICAL DATA:  Altered mental status. Headache. Weakness. Change in speech.  EXAM:  CT HEAD WITHOUT CONTRAST  TECHNIQUE: Contiguous axial images were obtained from the base of the skull through the vertex without intravenous contrast.  COMPARISON:  01/09/2011  FINDINGS: Mild cerebral atrophy. No mass effect or midline shift. No abnormal extra-axial fluid collections. Gray-white matter junctions are distinct. Basal cisterns are not effaced. No evidence of acute intracranial hemorrhage. No depressed skull fractures. Visualized paranasal sinuses and mastoid air cells are not opacified. Soft tissue hematoma over the posterior occipital region. Vascular calcifications.  IMPRESSION: No acute intracranial abnormalities.   Electronically Signed   By: Lucienne Capers M.D.   On: 06/27/2014 00:54   Mri Brain Without Contrast  06/27/2014   CLINICAL DATA:  Episodic memory loss. Stroke risk factors include hypertension and diabetes.  EXAM: MRI HEAD WITHOUT CONTRAST  MRA HEAD WITHOUT CONTRAST  TECHNIQUE: Multiplanar, multiecho pulse sequences of the brain and surrounding structures were obtained without intravenous contrast. Angiographic images of the head were obtained using MRA technique without contrast.  COMPARISON:  CT head earlier in the day.  FINDINGS: MRI HEAD FINDINGS  No evidence for acute infarction, hemorrhage, mass lesion, hydrocephalus, or extra-axial fluid. Premature for age cerebral and cerebellar atrophy. Mild subcortical and periventricular T2 and FLAIR hyperintensities, likely chronic microvascular ischemic change. Flow voids are maintained throughout the carotid, basilar, and vertebral arteries. There are no areas of chronic hemorrhage. Mildly prominent perivascular spaces. No cortical infarct is evident. Pituitary, pineal, and cerebellar tonsils unremarkable. No upper cervical lesions. Visualized calvarium, skull base, and upper cervical osseous structures unremarkable. Scalp and extracranial soft tissues, orbits, sinuses, and mastoids show no acute process. BILATERAL cataract extraction.   MRA HEAD FINDINGS  The RIGHT internal carotid artery is narrowed in its inferior cavernous segment, estimated 75% stenosis. Supraclinoid RIGHT ICA and RICA terminus are widely patent.  50-75% stenosis of the cavernous and supraclinoid ICA on the LEFT. LICA terminus widely patent.  Severely diseased RIGHT A1 ACA, likely insignificant given the robust LEFT A1 ACA.  Basilar artery widely patent but dolichoectatic. Codominant vertebral arteries.  No proximal MCA stenosis. No MCA branch occlusion. Both posterior cerebral arteries widely patent. No cerebellar branch occlusion. No intracranial aneurysm.  IMPRESSION: No acute stroke.  Chronic changes as described.  Potentially flow limiting stenoses of the skull base internal carotid arteries, RIGHT greater than LEFT. No proximal MCA narrowing.   Electronically Signed   By: Rolla Flatten M.D.   On: 06/27/2014 10:59   Mr Jodene Nam Head/brain Wo Cm  06/27/2014   CLINICAL DATA:  Episodic memory loss. Stroke risk factors include hypertension and diabetes.  EXAM: MRI HEAD WITHOUT CONTRAST  MRA HEAD WITHOUT CONTRAST  TECHNIQUE: Multiplanar, multiecho pulse sequences of the brain and surrounding structures were obtained without intravenous contrast. Angiographic images of the head were obtained using MRA technique without contrast.  COMPARISON:  CT head earlier in the day.  FINDINGS: MRI HEAD FINDINGS  No evidence for acute infarction, hemorrhage, mass lesion, hydrocephalus, or extra-axial fluid. Premature for age cerebral and cerebellar atrophy. Mild subcortical and periventricular T2  and FLAIR hyperintensities, likely chronic microvascular ischemic change. Flow voids are maintained throughout the carotid, basilar, and vertebral arteries. There are no areas of chronic hemorrhage. Mildly prominent perivascular spaces. No cortical infarct is evident. Pituitary, pineal, and cerebellar tonsils unremarkable. No upper cervical lesions. Visualized calvarium, skull base, and upper cervical  osseous structures unremarkable. Scalp and extracranial soft tissues, orbits, sinuses, and mastoids show no acute process. BILATERAL cataract extraction.  MRA HEAD FINDINGS  The RIGHT internal carotid artery is narrowed in its inferior cavernous segment, estimated 75% stenosis. Supraclinoid RIGHT ICA and RICA terminus are widely patent.  50-75% stenosis of the cavernous and supraclinoid ICA on the LEFT. LICA terminus widely patent.  Severely diseased RIGHT A1 ACA, likely insignificant given the robust LEFT A1 ACA.  Basilar artery widely patent but dolichoectatic. Codominant vertebral arteries.  No proximal MCA stenosis. No MCA branch occlusion. Both posterior cerebral arteries widely patent. No cerebellar branch occlusion. No intracranial aneurysm.  IMPRESSION: No acute stroke.  Chronic changes as described.  Potentially flow limiting stenoses of the skull base internal carotid arteries, RIGHT greater than LEFT. No proximal MCA narrowing.   Electronically Signed   By: Rolla Flatten M.D.   On: 06/27/2014 10:59    Scheduled Meds: . atorvastatin  40 mg Oral q1800  . cilostazol  100 mg Oral BID  . clopidogrel  75 mg Oral Daily  . enoxaparin (LOVENOX) injection  40 mg Subcutaneous Q24H  . insulin aspart  0-9 Units Subcutaneous TID WC  . insulin detemir  26 Units Subcutaneous QHS  . sodium chloride  3 mL Intravenous Q12H   Continuous Infusions: . sodium chloride 0.9 % 1,000 mL infusion 75 mL/hr at 06/28/14 0140    Principal Problem:   TIA (transient ischemic attack) Active Problems:   DM   HYPERTENSION   Transient global amnesia   CKD (chronic kidney disease)    Time spent: [place] MD [facility] Pager [phone]. If 7PM-7AM, please contact night-coverage at [email or web address], password [facility] 06/28/2014, 2:23 PM  LOS: 2 days

## 2014-06-29 ENCOUNTER — Ambulatory Visit (HOSPITAL_COMMUNITY): Payer: BC Managed Care – PPO

## 2014-06-29 ENCOUNTER — Inpatient Hospital Stay (HOSPITAL_COMMUNITY): Payer: BC Managed Care – PPO

## 2014-06-29 DIAGNOSIS — G459 Transient cerebral ischemic attack, unspecified: Principal | ICD-10-CM

## 2014-06-29 DIAGNOSIS — I10 Essential (primary) hypertension: Secondary | ICD-10-CM

## 2014-06-29 LAB — BASIC METABOLIC PANEL
ANION GAP: 10 (ref 5–15)
BUN: 24 mg/dL — ABNORMAL HIGH (ref 6–23)
CHLORIDE: 112 meq/L (ref 96–112)
CO2: 21 mEq/L (ref 19–32)
Calcium: 8.3 mg/dL — ABNORMAL LOW (ref 8.4–10.5)
Creatinine, Ser: 1.62 mg/dL — ABNORMAL HIGH (ref 0.50–1.35)
GFR calc non Af Amer: 44 mL/min — ABNORMAL LOW (ref >90)
GFR, EST AFRICAN AMERICAN: 51 mL/min — AB (ref >90)
Glucose, Bld: 96 mg/dL (ref 70–99)
POTASSIUM: 4.1 meq/L (ref 3.7–5.3)
Sodium: 143 mEq/L (ref 137–147)

## 2014-06-29 LAB — GLUCOSE, CAPILLARY
GLUCOSE-CAPILLARY: 97 mg/dL (ref 70–99)
Glucose-Capillary: 120 mg/dL — ABNORMAL HIGH (ref 70–99)
Glucose-Capillary: 135 mg/dL — ABNORMAL HIGH (ref 70–99)

## 2014-06-29 MED ORDER — CLOPIDOGREL BISULFATE 75 MG PO TABS
75.0000 mg | ORAL_TABLET | Freq: Every day | ORAL | Status: DC
Start: 2014-06-29 — End: 2014-07-16

## 2014-06-29 NOTE — Progress Notes (Signed)
STROKE TEAM PROGRESS NOTE   HISTORY [name] is a 63 y.o. male with a history of hypertension, hyperlipidemia, and diabetes mellitus who lives in [facility] [place] [facility] and works as a Theme park manager. Last night at around 8:30 PM the patient left home to pick up a few things at the store. He has lived in [place] most of his life and knows the area well; however, while going home nothing looked familiar to him and he felt lost. He knew who he was and where he was going but he could not find his way. He drove around for about an hour. At one point his wife saw him drive past their house but he did not stop. She went out to look for him and eventually found him stopped at a traffic signal. When she walked up to his car she felt he had a strange look on his face; although, he states that he recognized her immediately. He was taken to [facility] for further evaluation and subsequently transferred to [facility]. A CT scan and MRI were negative for acute findings. An MRA revealed a right internal carotid artery narrowing in its inferior cavernous segment, estimated at 75% stenosis. The patient takes 81 mg of aspirin daily and denies missing any recent doses.. He has never had a similar episode of confusion. The family states he is normally very sharp. He denied having any weakness of the extremities during this event. His daughter felt that his voice was not normal, almost like he was speaking with a lisp. She noted no facial droop or slurred speech. His mother suffered a stroke at age 53. The patient feels completely back to normal at this time.   Patient was not administered TPA secondary to resolution of deficits.. Probable TIA.   SUBJECTIVE (INTERVAL HISTORY) Wife at bedside. Friends visiting are now leaving. They report the story back to Dr. [name]. They reported about confusion or [name] for about one hour the other day. Now resolved. MRI neg and MRA showed right distal ICA 75% stenosis, does not need  any intervention at this time.  OBJECTIVE Filed Vitals:   06/29/14 0125 06/29/14 0700 06/29/14 1100 06/29/14 1456  BP: 103/68 114/66 124/67 159/76  Pulse: 70 71 64 68  Temp: 98.1 F (36.7 C) 98.2 F (36.8 C) 98.3 F (36.8 C) 98.6 F (37 C)  TempSrc: Oral Oral Oral Oral  Resp: 22 20 20 18   Height:      Weight:      SpO2: 95% 95%  100%    Recent Labs Lab 06/28/14 1148 06/28/14 1654 06/28/14 2106 06/29/14 0713 06/29/14 1147  GLUCAP 119* 153* 130* 97 120*    Recent Labs Lab 06/26/14 2344 06/28/14 0400 06/29/14 0759  NA 141 142 143  K 3.8 4.3 4.1  CL 103 109 112  CO2 25 21 21   GLUCOSE 83 104* 96  BUN 37* 28* 24*  CREATININE 2.16* 1.67* 1.62*  CALCIUM 8.7 8.4 8.3*    Recent Labs Lab 06/26/14 2344  AST 27  ALT 23  ALKPHOS 102  BILITOT 0.3  PROT 6.7  ALBUMIN 3.1*    Recent Labs Lab 06/26/14 2344  WBC 7.0  NEUTROABS 4.9  HGB 12.5*  HCT 36.0*  MCV 87.8  PLT 207   No results found for this basename: CKTOTAL, CKMB, CKMBINDEX, TROPONINI,  in the last 168 hours No results found for this basename: LABPROT, INR,  in the last 72 hours  Recent Labs  06/26/14  [place] 1.010  PHURINE 6.0  GLUCOSEU NEGATIVE  HGBUR NEGATIVE  BILIRUBINUR NEGATIVE  KETONESUR NEGATIVE  PROTEINUR NEGATIVE  UROBILINOGEN 0.2  NITRITE NEGATIVE  LEUKOCYTESUR NEGATIVE       Component Value Date/Time   CHOL 127 06/27/2014 0740   TRIG 41 06/27/2014 0740   TRIG 98 10/27/2013 1305   HDL 57 06/27/2014 0740   HDL 39* 10/27/2013 1305   CHOLHDL 2.2 06/27/2014 0740   VLDL 8 06/27/2014 0740   LDLCALC 62 06/27/2014 0740   LDLCALC 82 10/27/2013 1305   Lab Results  Component Value Date   HGBA1C 6.0* 06/27/2014      Component Value Date/Time   LABOPIA NONE DETECTED 06/26/2014 2326   COCAINSCRNUR NONE DETECTED 06/26/2014 2326   LABBENZ NONE DETECTED 06/26/2014 2326   AMPHETMU NONE DETECTED 06/26/2014 2326   THCU NONE DETECTED 06/26/2014 2326   LABBARB NONE  DETECTED 06/26/2014 2326     Recent Labs Lab 06/26/14 2344  ETH <11    Dg Chest 2 View 06/27/2014    Shallow inspiration with atelectasis or infiltration in the left lung base. Mild cardiac enlargement.     Ct Head Wo Contrast 06/27/2014    No acute intracranial abnormalities.    Mri Brain Without Contrast 06/27/2014    No acute stroke.  Chronic changes as described.    Mr Jodene Nam Head/brain Wo Cm 06/27/2014     Potentially flow limiting stenoses of the skull base internal carotid arteries, RIGHT greater than LEFT. No proximal MCA narrowing.     2D Echocardiogram  EF 65-70% with no source of embolus. Impressions:  Normal left ventricular size and systolic function  Carotid Doppler  No evidence of hemodynamically significant internal carotid artery stenosis. Vertebral artery flow is antegrade.   EEG is normal.    PHYSICAL EXAM  GENERAL:  Very pleasant man in no acute distress.  HEENT: Supple. Atraumatic normocephalic.   ABDOMEN: soft  EXTREMITIES: No edema. Left above-the-knee amputation due to diabetes   BACK: Normal.  SKIN: Normal by inspection.    MENTAL STATUS: Alert and oriented. Speech, language and cognition are generally intact. Judgment and insight normal.   CRANIAL NERVES: Pupils are equal, round and reactive to light and accommodation; extra ocular movements are full, there is no significant nystagmus; visual fields are full on R, but field cut left eye; upper and lower facial muscles are normal in strength and symmetric, there is no flattening of the nasolabial folds; tongue is midline; uvula is midline; shoulder elevation is normal.  MOTOR: Normal tone, bulk and strength; no pronator drift.  COORDINATION: Left finger to nose is normal, right finger to nose is normal, No rest tremor; no intention tremor; no postural tremor; no bradykinesia.  SENSATION: Normal to light touch.   ASSESSMENT/PLAN  [name] is a 63 y.o. male with hx of hypertension,  hyperlipidemia, and diabetes mellitus presenting with transient confusion. Etiology is unclear but the differential diagnosis includes TIA, transient global amnesia, complex partial seizures. He did not receive IV t-PA as deficits had resolved. Imaging confirms no infarct. Does not resemble TGA since pt still remember the event, but completely loss memory during that period. [name] needs to rule out seizure and so far EEG negative. Will treat for possible TIA.  Possible TIA:   aspirin 81 mg orally every day prior to admission, now on Plavix 75 mg daily.   MRI - No acute stroke.  MRA - right distal ICA  stenosis 75%, no intervention needed.  Carotid No significant stenosis  2D Echo no source of embolus  Lovenox for VTE prophylaxis  Carb Control thin liquids.   Bathroom privileges with assistance  Resultant - resolution of deficits.  Therapy needs:  No PT or OT needs  Risk factor management/education  Patient counseled to be compliant with his antithrombotic medications  Disposition:  Home with wife  EEG This awake and asleep EEG is normal.   Hypertension   Home meds:  Tenormin. Not resumed in hospital BP: (103-135)/(66-80) 124/67 mmHg (08/31 1100)  Stable  Patient counseled to be compliant with his blood pressure medications  Hyperlipidemia  Home meds: Crestor - now on Lipitor  LDL 62  At LDL goal <70 for diabetics  Diabetes  Home meds:  NovoLog and Levemir insulin  HgbA1c 6.0  Controlled  At Goal < 7.0  Other Stroke Risk Factors Advanced age   Obesity, Body mass index is 34.06 kg/(m^2).    Family hx stroke (patient's mother had a stroke at age 91)  No hx stroke. Confirmed with MRI obstructive sleep apnea, has broke CPAP machine that needs replaced  Other Active Problems  Renal insufficiency  Other Pertinent History  Peripheral vascular disease  Hospital day # 3  No further stroke workup indicated.  Patient has a 10-15% risk of having  another stroke over the next year, the highest risk is within 2 weeks of the most recent stroke/TIA (risk of having a stroke following a stroke or TIA is the same).  Ongoing risk factor control by Primary Care Physician  Stroke Service will sign off. Please call should any needs arise.  Follow up with Dr. [name], [facility], in 2 months.  Burnetta Sabin, MSN, RN, ANVP-BC, ANP-BC, [facility] Pager: [phone] 06/29/2014 12:18 PM  I, the attending vascular neurologist, have personally obtained a history, examined the patient, evaluated laboratory data, individually viewed imaging studies, and formulated the assessment and plan of care.  I have made any additions or clarifications directly to the above note and agree with the findings and plan as currently documented.   Rosalin Hawking, MD PhD Stroke Neurology 06/29/2014 11:57 PM    To contact Stroke Continuity provider, please refer to [email or web address] After hours, contact General Neurology

## 2014-06-29 NOTE — Progress Notes (Signed)
Routine EEG completed, results pending. 

## 2014-06-29 NOTE — Procedures (Signed)
ELECTROENCEPHALOGRAM REPORT  Date of Study: 06/29/2014  Patient's Name: [name] MRN: [id number] Date of Birth: 11-29-1950  Referring Provider: Dr. Mikey Bussing  Clinical History:  This is a 63 year old man who had an episode while driving where nothing looked familiar to him and he felt lost. He knew who he was and where he was going but he could not find his way. He drove around for about an hour. At one point his wife saw him drive past their house but he did not stop.   Medications:  atorvastatin   40 mg  Oral  q1800   .  cilostazol   100 mg  Oral  BID   .  clopidogrel   75 mg  Oral  Daily   .  enoxaparin (LOVENOX) injection   40 mg  Subcutaneous  Q24H   .  insulin aspart   0-9 Units  Subcutaneous  TID WC   .  insulin detemir   26 Units  Subcutaneous  QHS   .  sodium chloride   3 mL  Intravenous  Q12H    Technical Summary: A multichannel digital EEG recording measured by the international 10-20 system with electrodes applied with paste and impedances below 5000 ohms performed in our laboratory with EKG monitoring in an awake and asleep patient.  Hyperventilation was not performed. Photic stimulation was performed.  The digital EEG was referentially recorded, reformatted, and digitally filtered in a variety of bipolar and referential montages for optimal display.    Description: The patient is awake and asleep during the recording.  During maximal wakefulness, there is a symmetric, medium voltage 9-9.5 Hz posterior dominant rhythm that attenuates with eye opening.  The record is symmetric.  During drowsiness and stage I sleep, there is an increase in theta slowing of the background with rare vertex waves seen.  Photic stimulation did not elicit any abnormalities.  There were no epileptiform discharges or electrographic seizures seen.    EKG lead was unremarkable.  Impression: This awake and asleep EEG is normal.    Clinical Correlation: A normal EEG does not exclude a clinical  diagnosis of epilepsy. Clinical correlation is advised.   Ellouise Newer, M.D.

## 2014-06-29 NOTE — Discharge Summary (Signed)
Physician Discharge Summary  [name] [id number] DOB: 1950/11/01 DOA: 06/26/2014  PCP: Tom Gainer, MD  Admit date: 06/26/2014 Discharge date: 06/29/2014  Time spent: 65 minutes  Recommendations for Outpatient Follow-up:  1. Followup with neurology, Dr [name] as outpatient in 2 months. 2. Followup with Tom Gainer, MD in 1 week. On followup a basic metabolic profile will need to be obtained to followup on patient's electrolytes and renal function. 3. Patient has been set up for outpatient PT/OT.  Discharge Diagnoses:  Principal Problem:   TIA (transient ischemic attack) Active Problems:   DM   HYPERTENSION   Transient global amnesia   CKD (chronic kidney disease)   Discharge Condition: Stable and improved  Diet recommendation: Diabetic/carb modified  Filed Weights   06/26/14 2240 06/27/14 0500  Weight: 116.121 kg (256 lb) 113.944 kg (251 lb 3.2 oz)    History of present illness:  63 year old male who has a past medical history of Hypertension; Hyperlipidemia; Type 2 Diabetes mellitus; and Necrosis.  Today came to the ED after patient had an episode of brief altered mental status while driving home around 8 PM. As per patient, he could not get home patient was driving and able to make it home. Patient says that he forgot the way, and as noted on the would have after that. At home wife checked CBG was 86. He denies passing out, no slurred speech no focal weakness, no nausea vomiting or diarrhea no chest pain shortness of breath. Patient did not have similar episode in the past.  In the ED CT head was negative for stroke. Patient is back to his baseline.      Hospital Course:  #1. Probable TIA vs Transient Global Amnesia  Patient was admitted with episode of brief altered mental status while he was driving home and couldn't remember how to get home. On presentation to the ED patient did not have any slurred speech no focal weakness no syncope no seizure-like activity.  Patient was back to his baseline. CT of the head which was done was negative. She was admitted for stroke workup. MRI of head neg for CVA, MRA with potentially flow-limiting stenosis of the skull base internal carotid arteries right greater than left. 2-D echo with no source of emboli. Carotid Dopplers preliminary readings negative for any significant ICA stenosis. LDL is 62 patient on a statin prior to admission. Patient was on aspirin prior to admission which has been changed to Plavix 75 mg daily for secondary stroke prevention. Patient is being seen by neurology and recommended an EEG at this time. Neurology has contacted interventional radiology, Dr. Marjory Lies for outpatient followup. EEG which was done was negative for any epileptiform activities. Patient improved clinically did not have any further episodes. Patient will followup with neurology as outpatient. #2 hypertension  On admission patient's blood pressure was borderline and a such his antihypertensive medications were held. Patient remained in stable condition. Patient was hydrated with IV fluids. On day of discharge patient's blood pressure started to be elevated and as such patient has been resumed on a home regimen.  #3 well controlled type 2 diabetes  Hemoglobin A1c = 6.0. CBGs have ranged from 97-130. Patient was started on Levemir and SSI during the hospialization.      Procedures: MRI/MRA 06/27/14  CT Head 06/27/14  CXR 06/27/14  2-D echo 06/28/2014  Carotid Dopplers 06/27/2014 EEG 06/29/14  Consultations: Neurology: Dr. Doy Bradshaw 06/27/2014   Discharge Exam: Filed Vitals:   06/29/14 1456  BP: 159/76  Pulse: 68  Temp: 98.6 F (37 C)  Resp: 18    General: nad Cardiovascular: rrr Respiratory: ctab  Discharge Instructions You were cared for by a hospitalist during your hospital stay. If you have any questions about your discharge medications or the care you received while you were in the hospital after you are  discharged, you can call the unit and asked to speak with the hospitalist on call if the hospitalist that took care of you is not available. Once you are discharged, your primary care physician will handle any further medical issues. Please note that NO REFILLS for any discharge medications will be authorized once you are discharged, as it is imperative that you return to your primary care physician (or establish a relationship with a primary care physician if you do not have one) for your aftercare needs so that they can reassess your need for medications and monitor your lab values.      Discharge Instructions   Diet Carb Modified    Complete by:  As directed      Discharge instructions    Complete by:  As directed   Follow up with Dr [name] in 2 months. Follow up with Tom Gainer, MD in 1 week.     Increase activity slowly    Complete by:  As directed             Medication List    STOP taking these medications       aspirin EC 81 MG tablet      TAKE these medications       ASSURE COMFORT LANCETS 30G Misc  1 each by Other route 4 (four) times daily.     atenolol 25 MG tablet  Commonly known as:  TENORMIN  Take 1 tablet (25 mg total) by mouth daily.     CALCIUM PLUS VITAMIN D3 600-500 MG-UNIT Caps  Generic drug:  Calcium Carb-Cholecalciferol  Take 1 capsule by mouth daily.     cilostazol 100 MG tablet  Commonly known as:  PLETAL  Take 1 tablet (100 mg total) by mouth 2 (two) times daily.     clopidogrel 75 MG tablet  Commonly known as:  PLAVIX  Take 1 tablet (75 mg total) by mouth daily.     exenatide 5 MCG/0.02ML Sopn injection  Commonly known as:  BYETTA  Inject 10 mcg into the skin 2 (two) times daily with a meal.     furosemide 40 MG tablet  Commonly known as:  LASIX  Take 40 mg by mouth daily.     GMATE BLOOD GLUCOSE TEST test strip  Generic drug:  glucose blood  1 each by Other route 4 (four) times daily.     insulin aspart 100 UNIT/ML injection   Commonly known as:  novoLOG  Inject 8 Units into the skin 3 (three) times daily before meals.     insulin detemir 100 UNIT/ML injection  Commonly known as:  LEVEMIR  Inject 26 Units into the skin at bedtime.     rosuvastatin 20 MG tablet  Commonly known as:  CRESTOR  Take 1 tablet (20 mg total) by mouth daily.       Allergies  Allergen Reactions  . Zolpidem Tartrate Other (See Comments)    disorientation    Follow-up Information   Follow up with [facility]. ([facility] will call you this week to arrange an appt.)    Contact information:   [address] [place],  [place] [address] [phone]      Follow up with Tom Gainer, MD. Schedule an appointment as soon as possible for a visit in 1 week.   Specialty:  Family Medicine   Contact information:   [place] [address] [phone]       Follow up with Xu,Jindong, MD. Schedule an appointment as soon as possible for a visit in 2 months.   Specialty:  Neurology   Contact information:   [address] [place]  [address] [phone]        The results of significant diagnostics from this hospitalization (including imaging, microbiology, ancillary and laboratory) are listed below for reference.    Significant Diagnostic Studies: Dg Chest 2 View  06/27/2014   CLINICAL DATA:  Altered mental status. Weakness and headache. Disorientation.  EXAM: CHEST  2 VIEW  COMPARISON:  None.  FINDINGS: Shallow inspiration. Cardiac enlargement with normal pulmonary vascularity. Atelectasis or infiltration in the left lung base. No blunting of costophrenic angles. No pneumothorax. Calcified and tortuous aorta. Degenerative changes in the spine.  IMPRESSION: Shallow inspiration with atelectasis or infiltration in the left lung base. Mild cardiac enlargement.   Electronically Signed   By: Lucienne Capers M.D.   On: 06/27/2014 01:03   Ct Head Wo Contrast  06/27/2014    CLINICAL DATA:  Altered mental status. Headache. Weakness. Change in speech.  EXAM: CT HEAD WITHOUT CONTRAST  TECHNIQUE: Contiguous axial images were obtained from the base of the skull through the vertex without intravenous contrast.  COMPARISON:  01/09/2011  FINDINGS: Mild cerebral atrophy. No mass effect or midline shift. No abnormal extra-axial fluid collections. Gray-white matter junctions are distinct. Basal cisterns are not effaced. No evidence of acute intracranial hemorrhage. No depressed skull fractures. Visualized paranasal sinuses and mastoid air cells are not opacified. Soft tissue hematoma over the posterior occipital region. Vascular calcifications.  IMPRESSION: No acute intracranial abnormalities.   Electronically Signed   By: Lucienne Capers M.D.   On: 06/27/2014 00:54   Mri Brain Without Contrast  06/27/2014   CLINICAL DATA:  Episodic memory loss. Stroke risk factors include hypertension and diabetes.  EXAM: MRI HEAD WITHOUT CONTRAST  MRA HEAD WITHOUT CONTRAST  TECHNIQUE: Multiplanar, multiecho pulse sequences of the brain and surrounding structures were obtained without intravenous contrast. Angiographic images of the head were obtained using MRA technique without contrast.  COMPARISON:  CT head earlier in the day.  FINDINGS: MRI HEAD FINDINGS  No evidence for acute infarction, hemorrhage, mass lesion, hydrocephalus, or extra-axial fluid. Premature for age cerebral and cerebellar atrophy. Mild subcortical and periventricular T2 and FLAIR hyperintensities, likely chronic microvascular ischemic change. Flow voids are maintained throughout the carotid, basilar, and vertebral arteries. There are no areas of chronic hemorrhage. Mildly prominent perivascular spaces. No cortical infarct is evident. Pituitary, pineal, and cerebellar tonsils unremarkable. No upper cervical lesions. Visualized calvarium, skull base, and upper cervical osseous structures unremarkable. Scalp and extracranial soft tissues,  orbits, sinuses, and mastoids show no acute process. BILATERAL cataract extraction.  MRA HEAD FINDINGS  The RIGHT internal carotid artery is narrowed in its inferior cavernous segment, estimated 75% stenosis. Supraclinoid RIGHT ICA and RICA terminus are widely patent.  50-75% stenosis of the cavernous and supraclinoid ICA on the LEFT. LICA terminus widely patent.  Severely diseased RIGHT A1 ACA, likely insignificant given the robust LEFT A1 ACA.  Basilar artery widely patent but dolichoectatic. Codominant vertebral arteries.  No proximal MCA stenosis. No MCA branch occlusion. Both posterior  cerebral arteries widely patent. No cerebellar branch occlusion. No intracranial aneurysm.  IMPRESSION: No acute stroke.  Chronic changes as described.  Potentially flow limiting stenoses of the skull base internal carotid arteries, RIGHT greater than LEFT. No proximal MCA narrowing.   Electronically Signed   By: Rolla Flatten M.D.   On: 06/27/2014 10:59   Mr Jodene Nam Head/brain Wo Cm  06/27/2014   CLINICAL DATA:  Episodic memory loss. Stroke risk factors include hypertension and diabetes.  EXAM: MRI HEAD WITHOUT CONTRAST  MRA HEAD WITHOUT CONTRAST  TECHNIQUE: Multiplanar, multiecho pulse sequences of the brain and surrounding structures were obtained without intravenous contrast. Angiographic images of the head were obtained using MRA technique without contrast.  COMPARISON:  CT head earlier in the day.  FINDINGS: MRI HEAD FINDINGS  No evidence for acute infarction, hemorrhage, mass lesion, hydrocephalus, or extra-axial fluid. Premature for age cerebral and cerebellar atrophy. Mild subcortical and periventricular T2 and FLAIR hyperintensities, likely chronic microvascular ischemic change. Flow voids are maintained throughout the carotid, basilar, and vertebral arteries. There are no areas of chronic hemorrhage. Mildly prominent perivascular spaces. No cortical infarct is evident. Pituitary, pineal, and cerebellar tonsils  unremarkable. No upper cervical lesions. Visualized calvarium, skull base, and upper cervical osseous structures unremarkable. Scalp and extracranial soft tissues, orbits, sinuses, and mastoids show no acute process. BILATERAL cataract extraction.  MRA HEAD FINDINGS  The RIGHT internal carotid artery is narrowed in its inferior cavernous segment, estimated 75% stenosis. Supraclinoid RIGHT ICA and RICA terminus are widely patent.  50-75% stenosis of the cavernous and supraclinoid ICA on the LEFT. LICA terminus widely patent.  Severely diseased RIGHT A1 ACA, likely insignificant given the robust LEFT A1 ACA.  Basilar artery widely patent but dolichoectatic. Codominant vertebral arteries.  No proximal MCA stenosis. No MCA branch occlusion. Both posterior cerebral arteries widely patent. No cerebellar branch occlusion. No intracranial aneurysm.  IMPRESSION: No acute stroke.  Chronic changes as described.  Potentially flow limiting stenoses of the skull base internal carotid arteries, RIGHT greater than LEFT. No proximal MCA narrowing.   Electronically Signed   By: Rolla Flatten M.D.   On: 06/27/2014 10:59    Microbiology: No results found for this or any previous visit (from the past 240 hour(s)).   Labs: Basic Metabolic Panel:  Recent Labs Lab 06/26/14 2344 06/28/14 0400 06/29/14 0759  NA 141 142 143  K 3.8 4.3 4.1  CL 103 109 112  CO2 25 21 21   GLUCOSE 83 104* 96  BUN 37* 28* 24*  CREATININE 2.16* 1.67* 1.62*  CALCIUM 8.7 8.4 8.3*   Liver Function Tests:  Recent Labs Lab 06/26/14 2344  AST 27  ALT 23  ALKPHOS 102  BILITOT 0.3  PROT 6.7  ALBUMIN 3.1*   No results found for this basename: LIPASE, AMYLASE,  in the last 168 hours No results found for this basename: AMMONIA,  in the last 168 hours CBC:  Recent Labs Lab 06/26/14 2344  WBC 7.0  NEUTROABS 4.9  HGB 12.5*  HCT 36.0*  MCV 87.8  PLT 207   Cardiac Enzymes: No results found for this basename: CKTOTAL, CKMB,  CKMBINDEX, TROPONINI,  in the last 168 hours BNP: BNP (last 3 results) No results found for this basename: PROBNP,  in the last 8760 hours CBG:  Recent Labs Lab 06/28/14 1148 06/28/14 1654 06/28/14 2106 06/29/14 0713 06/29/14 1147  GLUCAP 119* 153* 130* 97 120*       Signed:  Marsena Taff MD [facility] 06/29/2014,  3:22 PM

## 2014-06-30 ENCOUNTER — Other Ambulatory Visit (HOSPITAL_COMMUNITY): Payer: Self-pay | Admitting: Interventional Radiology

## 2014-06-30 DIAGNOSIS — I10 Essential (primary) hypertension: Secondary | ICD-10-CM

## 2014-06-30 DIAGNOSIS — G459 Transient cerebral ischemic attack, unspecified: Secondary | ICD-10-CM

## 2014-06-30 DIAGNOSIS — G454 Transient global amnesia: Secondary | ICD-10-CM

## 2014-06-30 DIAGNOSIS — R55 Syncope and collapse: Secondary | ICD-10-CM

## 2014-07-01 ENCOUNTER — Ambulatory Visit (HOSPITAL_COMMUNITY)
Admission: RE | Admit: 2014-07-01 | Discharge: 2014-07-01 | Disposition: A | Discharge status: Home / Self Care | Payer: BC Managed Care – PPO | Source: Ambulatory Visit | Attending: Interventional Radiology | Admitting: Interventional Radiology

## 2014-07-01 DIAGNOSIS — G459 Transient cerebral ischemic attack, unspecified: Secondary | ICD-10-CM

## 2014-07-01 DIAGNOSIS — G454 Transient global amnesia: Secondary | ICD-10-CM

## 2014-07-01 DIAGNOSIS — R55 Syncope and collapse: Secondary | ICD-10-CM

## 2014-07-01 DIAGNOSIS — I10 Essential (primary) hypertension: Secondary | ICD-10-CM

## 2014-07-02 ENCOUNTER — Telehealth: Payer: Self-pay | Admitting: Family Medicine

## 2014-07-03 ENCOUNTER — Other Ambulatory Visit: Payer: Self-pay | Admitting: *Deleted

## 2014-07-03 ENCOUNTER — Ambulatory Visit (INDEPENDENT_AMBULATORY_CARE_PROVIDER_SITE_OTHER): Payer: BC Managed Care – PPO | Admitting: Family Medicine

## 2014-07-03 ENCOUNTER — Encounter: Payer: Self-pay | Admitting: Family Medicine

## 2014-07-03 VITALS — BP 134/78 | HR 75 | Temp 99.3°F | Ht 72.0 in | Wt 256.6 lb

## 2014-07-03 DIAGNOSIS — E119 Type 2 diabetes mellitus without complications: Secondary | ICD-10-CM

## 2014-07-03 DIAGNOSIS — G454 Transient global amnesia: Secondary | ICD-10-CM

## 2014-07-03 DIAGNOSIS — I10 Essential (primary) hypertension: Secondary | ICD-10-CM

## 2014-07-03 DIAGNOSIS — H538 Other visual disturbances: Secondary | ICD-10-CM

## 2014-07-03 DIAGNOSIS — R41842 Visuospatial deficit: Secondary | ICD-10-CM

## 2014-07-03 DIAGNOSIS — G458 Other transient cerebral ischemic attacks and related syndromes: Secondary | ICD-10-CM

## 2014-07-03 DIAGNOSIS — R410 Disorientation, unspecified: Secondary | ICD-10-CM

## 2014-07-03 LAB — GLUCOSE, POCT (MANUAL RESULT ENTRY)
POC Glucose: 41 mg/dl — AB (ref 70–99)
POC Glucose: 65 mg/dl — AB (ref 70–99)

## 2014-07-03 NOTE — Telephone Encounter (Signed)
appt scheduled. Patient and wife aware.

## 2014-07-03 NOTE — Progress Notes (Signed)
   Subjective:    Patient ID: [name], male    DOB: 11-Aug-1951, 63 y.o.   MRN: [id number]  HPI  63 year old gentleman who is here for hospital followup. He was admitted to the hospital from 8-28-8-31 with discharge diagnosis transient ischemic attack. His symptoms were short-term memory loss without any numbness weakness blindness etc. blood pressure meds were held at that time as his pressure was low normal. He has no residual sequela but apparently had a mental status exam scored only 26. He had some problem with subtraction as well as design copy suggesting an issue with spatial orientation.  I did a 5 minute recall 3 items today; he remembered 2 of the 3.    Review of Systems  Constitutional: Negative.   HENT: Negative.   Eyes: Negative.   Respiratory: Negative.  Negative for shortness of breath.   Cardiovascular: Negative.  Negative for chest pain and leg swelling.  Gastrointestinal: Negative.   Genitourinary: Negative.   Musculoskeletal: Negative.   Skin: Negative.   Neurological: Negative.   Psychiatric/Behavioral: Negative.   All other systems reviewed and are negative.      Objective:   Physical Exam  Cardiovascular: Normal rate and regular rhythm.   Pulmonary/Chest: Effort normal.  Neurological: He is alert. He has normal reflexes.  Psychiatric: He has a normal mood and affect. His behavior is normal. Judgment and thought content normal.    BP 134/78  Pulse 75  Temp(Src) 99.3 F (37.4 C) (Oral)  Ht 6' (1.829 m)  Wt 256 lb 9.6 oz (116.393 kg)  BMI 34.79 kg/m2      Assessment & Plan:  1. Type II or unspecified type diabetes mellitus without mention of complication, not stated as uncontrolled Uses [place] and long and short-acting insulin - POCT glucose (manual entry) - POCT glucose (manual entry)  2. HYPERTENSION BP stable  3. Other specified transient cerebral ischemias Pt to undergo some therapy Also need Rx for new CPAP  Wardell Honour MD

## 2014-07-03 NOTE — Progress Notes (Signed)
Referral placed for neuro-cognitive rehab per hospital visit.  Patient has an appt with Dr Sabra Heck today.

## 2014-07-03 NOTE — Patient Instructions (Signed)
Transient Ischemic Attack  A transient ischemic attack (TIA) is a "warning stroke" that causes stroke-like symptoms. Unlike a stroke, a TIA does not cause permanent damage to the brain. The symptoms of a TIA can happen very fast and do not last long. It is important to know the symptoms of a TIA and what to do. This can help prevent a major stroke or death.  CAUSES   · A TIA is caused by a temporary blockage in an artery in the brain or neck (carotid artery). The blockage does not allow the brain to get the blood supply it needs and can cause different symptoms. The blockage can be caused by either:  ¨ A blood clot.  ¨ Fatty buildup (plaque) in a neck or brain artery.  RISK FACTORS  · High blood pressure (hypertension).  · High cholesterol.  · Diabetes mellitus.  · Heart disease.  · The build up of plaque in the blood vessels (peripheral artery disease or atherosclerosis).  · The build up of plaque in the blood vessels providing blood and oxygen to the brain (carotid artery stenosis).  · An abnormal heart rhythm (atrial fibrillation).  · Obesity.  · Smoking.  · Taking oral contraceptives (especially in combination with smoking).  · Physical inactivity.  · A diet high in fats, salt (sodium), and calories.  · Alcohol use.  · Use of illegal drugs (especially cocaine and methamphetamine).  · Being male.  · Being African American.  · Being over the age of 55.  · Family history of stroke.  · Previous history of blood clots, stroke, TIA, or heart attack.  · Sickle cell disease.  SYMPTOMS   TIA symptoms are the same as a stroke but are temporary. These symptoms usually develop suddenly, or may be newly present upon awakening from sleep:  · Sudden weakness or numbness of the face, arm, or leg, especially on one side of the body.  · Sudden trouble walking or difficulty moving arms or legs.  · Sudden confusion.  · Sudden personality changes.  · Trouble speaking (aphasia) or understanding.  · Difficulty swallowing.  · Sudden  trouble seeing in one or both eyes.  · Double vision.  · Dizziness.  · Loss of balance or coordination.  · Sudden severe headache with no known cause.  · Trouble reading or writing.  · Loss of bowel or bladder control.  · Loss of consciousness.  DIAGNOSIS   Your caregiver may be able to determine the presence or absence of a TIA based on your symptoms, history, and physical exam. Computed tomography (CT scan) of the brain is usually performed to help identify a TIA. Other tests may be done to diagnose a TIA. These tests may include:  · Electrocardiography.  · Continuous heart monitoring.  · Echocardiography.  · Carotid ultrasonography.  · Magnetic resonance imaging (MRI).  · A scan of the brain circulation.  · Blood tests.  PREVENTION   The risk of a TIA can be decreased by appropriately treating high blood pressure, high cholesterol, diabetes, heart disease, and obesity and by quitting smoking, limiting alcohol, and staying physically active.  TREATMENT   Time is of the essence. Since the symptoms of TIA are the same as a stroke, it is important to seek treatment as soon as possible because you may need a medicine to dissolve the clot (thrombolytic) that cannot be given if too much time has passed. Treatment options vary. Treatment options may include rest, oxygen, intravenous (IV) fluids,   and medicines to thin the blood (anticoagulants). Medicines and diet may be used to address diabetes, high blood pressure, and other risk factors. Measures will be taken to prevent short-term and long-term complications, including infection from breathing foreign material into the lungs (aspiration pneumonia), blood clots in the legs, and falls. Treatment options include procedures to either remove plaque in the carotid arteries or dilate carotid arteries that have narrowed due to plaque. Those procedures are:  · Carotid endarterectomy.  · Carotid angioplasty and stenting.  HOME CARE INSTRUCTIONS   · Take all medicines prescribed  by your caregiver. Follow the directions carefully. Medicines may be used to control risk factors for a stroke. Be sure you understand all your medicine instructions.  · You may be told to take aspirin or the anticoagulant warfarin. Warfarin needs to be taken exactly as instructed.  ¨ Taking too much or too little warfarin is dangerous. Too much warfarin increases the risk of bleeding. Too little warfarin continues to allow the risk for blood clots. While taking warfarin, you will need to have regular blood tests to measure your blood clotting time. A PT blood test measures how long it takes for blood to clot. Your PT is used to calculate another value called an INR. Your PT and INR help your caregiver to adjust your dose of warfarin. The dose can change for many reasons. It is critically important that you take warfarin exactly as prescribed.  ¨ Many foods, especially foods high in vitamin K can interfere with warfarin and affect the PT and INR. Foods high in vitamin K include spinach, kale, broccoli, cabbage, collard and turnip greens, brussels sprouts, peas, cauliflower, seaweed, and parsley as well as beef and pork liver, green tea, and soybean oil. You should eat a consistent amount of foods high in vitamin K. Avoid major changes in your diet, or notify your caregiver before changing your diet. Arrange a visit with a dietitian to answer your questions.  ¨ Many medicines can interfere with warfarin and affect the PT and INR. You must tell your caregiver about any and all medicines you take, this includes all vitamins and supplements. Be especially cautious with aspirin and anti-inflammatory medicines. Do not take or discontinue any prescribed or over-the-counter medicine except on the advice of your caregiver or pharmacist.  ¨ Warfarin can have side effects, such as excessive bruising or bleeding. You will need to hold pressure over cuts for longer than usual. Your caregiver or pharmacist will discuss other  potential side effects.  ¨ Avoid sports or activities that may cause injury or bleeding.  ¨ Be mindful when shaving, flossing your teeth, or handling sharp objects.  ¨ Alcohol can change the body's ability to handle warfarin. It is best to avoid alcoholic drinks or consume only very small amounts while taking warfarin. Notify your caregiver if you change your alcohol intake.  ¨ Notify your dentist or other caregivers before procedures.  · Eat a diet that includes 5 or more servings of fruits and vegetables each day. This may reduce the risk of stroke. Certain diets may be prescribed to address high blood pressure, high cholesterol, diabetes, or obesity.  ¨ A low-sodium, low-saturated fat, low-trans fat, low-cholesterol diet is recommended to manage high blood pressure.  ¨ A low-saturated fat, low-trans fat, low-cholesterol, and high-fiber diet may control cholesterol levels.  ¨ A controlled-carbohydrate, controlled-sugar diet is recommended to manage diabetes.  ¨ A reduced-calorie, low-sodium, low-saturated fat, low-trans fat, low-cholesterol diet is recommended to manage obesity.  ·   Maintain a healthy weight.  · Stay physically active. It is recommended that you get at least 30 minutes of activity on most or all days.  · Do not smoke.  · Limit alcohol use even if you are not taking warfarin. Moderate alcohol use is considered to be:  ¨ No more than 2 drinks each day for men.  ¨ No more than 1 drink each day for nonpregnant women.  · Stop drug abuse.  · Home safety. A safe home environment is important to reduce the risk of falls. Your caregiver may arrange for specialists to evaluate your home. Having grab bars in the bedroom and bathroom is often important. Your caregiver may arrange for equipment to be used at home, such as raised toilets and a seat for the shower.  · Follow all instructions for follow-up with your caregiver. This is very important. This includes any referrals and lab tests. Proper follow up can  prevent a stroke or another TIA from occurring.  SEEK MEDICAL CARE IF:  · You have personality changes.  · You have difficulty swallowing.  · You are seeing double.  · You have dizziness.  · You have a fever.  · You have skin breakdown.  SEEK IMMEDIATE MEDICAL CARE IF:   Any of these symptoms may represent a serious problem that is an emergency. Do not wait to see if the symptoms will go away. Get medical help right away. Call your local emergency services (911 in [place] Do not drive yourself to the hospital.  · You have sudden weakness or numbness of the face, arm, or leg, especially on one side of the body.  · You have sudden trouble walking or difficulty moving arms or legs.  · You have sudden confusion.  · You have trouble speaking (aphasia) or understanding.  · You have sudden trouble seeing in one or both eyes.  · You have a loss of balance or coordination.  · You have a sudden, severe headache with no known cause.  · You have new chest pain or an irregular heartbeat.  · You have a partial or total loss of consciousness.  MAKE SURE YOU:   · Understand these instructions.  · Will watch your condition.  · Will get help right away if you are not doing well or get worse.  Document Released: 07/26/2005 Document Revised: 10/21/2013 Document Reviewed: 01/21/2014  [employer]® Patient Information ©2015 [employer]. This information is not intended to replace advice given to you by your health care provider. Make sure you discuss any questions you have with your health care provider.

## 2014-07-06 ENCOUNTER — Encounter (HOSPITAL_COMMUNITY): Payer: Self-pay | Admitting: Emergency Medicine

## 2014-07-06 ENCOUNTER — Emergency Department (HOSPITAL_COMMUNITY): Payer: BC Managed Care – PPO

## 2014-07-06 ENCOUNTER — Emergency Department (HOSPITAL_COMMUNITY)
Admission: EM | Admit: 2014-07-06 | Discharge: 2014-07-06 | Disposition: A | Discharge status: Home / Self Care | Payer: BC Managed Care – PPO | Attending: Emergency Medicine | Admitting: Emergency Medicine

## 2014-07-06 DIAGNOSIS — S78119A Complete traumatic amputation at level between unspecified hip and knee, initial encounter: Secondary | ICD-10-CM | POA: Diagnosis not present

## 2014-07-06 DIAGNOSIS — I129 Hypertensive chronic kidney disease with stage 1 through stage 4 chronic kidney disease, or unspecified chronic kidney disease: Secondary | ICD-10-CM | POA: Insufficient documentation

## 2014-07-06 DIAGNOSIS — Z872 Personal history of diseases of the skin and subcutaneous tissue: Secondary | ICD-10-CM | POA: Insufficient documentation

## 2014-07-06 DIAGNOSIS — R4182 Altered mental status, unspecified: Secondary | ICD-10-CM | POA: Insufficient documentation

## 2014-07-06 DIAGNOSIS — E785 Hyperlipidemia, unspecified: Secondary | ICD-10-CM | POA: Insufficient documentation

## 2014-07-06 DIAGNOSIS — E1169 Type 2 diabetes mellitus with other specified complication: Secondary | ICD-10-CM | POA: Insufficient documentation

## 2014-07-06 DIAGNOSIS — N189 Chronic kidney disease, unspecified: Secondary | ICD-10-CM | POA: Diagnosis not present

## 2014-07-06 DIAGNOSIS — Z794 Long term (current) use of insulin: Secondary | ICD-10-CM | POA: Diagnosis not present

## 2014-07-06 DIAGNOSIS — E162 Hypoglycemia, unspecified: Secondary | ICD-10-CM

## 2014-07-06 DIAGNOSIS — Z79899 Other long term (current) drug therapy: Secondary | ICD-10-CM | POA: Insufficient documentation

## 2014-07-06 LAB — COMPREHENSIVE METABOLIC PANEL
ALK PHOS: 78 U/L (ref 39–117)
ALT: 16 U/L (ref 0–53)
AST: 23 U/L (ref 0–37)
Albumin: 3.4 g/dL — ABNORMAL LOW (ref 3.5–5.2)
Anion gap: 13 (ref 5–15)
BUN: 27 mg/dL — AB (ref 6–23)
CO2: 24 meq/L (ref 19–32)
Calcium: 9.1 mg/dL (ref 8.4–10.5)
Chloride: 103 mEq/L (ref 96–112)
Creatinine, Ser: 1.86 mg/dL — ABNORMAL HIGH (ref 0.50–1.35)
GFR, EST AFRICAN AMERICAN: 43 mL/min — AB (ref >90)
GFR, EST NON AFRICAN AMERICAN: 37 mL/min — AB (ref >90)
GLUCOSE: 185 mg/dL — AB (ref 70–99)
POTASSIUM: 4.4 meq/L (ref 3.7–5.3)
SODIUM: 140 meq/L (ref 137–147)
TOTAL PROTEIN: 7.5 g/dL (ref 6.0–8.3)
Total Bilirubin: 0.3 mg/dL (ref 0.3–1.2)

## 2014-07-06 LAB — DIFFERENTIAL
Basophils Absolute: 0 10*3/uL (ref 0.0–0.1)
Basophils Relative: 0 % (ref 0–1)
EOS ABS: 0 10*3/uL (ref 0.0–0.7)
Eosinophils Relative: 0 % (ref 0–5)
LYMPHS ABS: 1.1 10*3/uL (ref 0.7–4.0)
LYMPHS PCT: 16 % (ref 12–46)
MONOS PCT: 5 % (ref 3–12)
Monocytes Absolute: 0.4 10*3/uL (ref 0.1–1.0)
NEUTROS PCT: 79 % — AB (ref 43–77)
Neutro Abs: 5.3 10*3/uL (ref 1.7–7.7)

## 2014-07-06 LAB — CBC
HCT: 40.8 % (ref 39.0–52.0)
HEMOGLOBIN: 14 g/dL (ref 13.0–17.0)
MCH: 30.4 pg (ref 26.0–34.0)
MCHC: 34.3 g/dL (ref 30.0–36.0)
MCV: 88.5 fL (ref 78.0–100.0)
PLATELETS: 198 10*3/uL (ref 150–400)
RBC: 4.61 MIL/uL (ref 4.22–5.81)
RDW: 14.6 % (ref 11.5–15.5)
WBC: 6.8 10*3/uL (ref 4.0–10.5)

## 2014-07-06 LAB — CBG MONITORING, ED
GLUCOSE-CAPILLARY: 181 mg/dL — AB (ref 70–99)
Glucose-Capillary: 69 mg/dL — ABNORMAL LOW (ref 70–99)
Glucose-Capillary: 164 mg/dL — ABNORMAL HIGH (ref 70–99)

## 2014-07-06 LAB — TROPONIN I: Troponin I: <0.3 ng/mL (ref <0.30)

## 2014-07-06 LAB — ETHANOL: Alcohol, Ethyl (B): <11 mg/dL (ref 0–11)

## 2014-07-06 LAB — PROTIME-INR
INR: 1.01 (ref 0.00–1.49)
Prothrombin Time: 13.3 seconds (ref 11.6–15.2)

## 2014-07-06 LAB — APTT: aPTT: 26 seconds (ref 24–37)

## 2014-07-06 MED ORDER — DEXTROSE 50 % IV SOLN
1.0000 | Freq: Once | INTRAVENOUS | Status: AC
  Administered 2014-07-06: 50 mL via INTRAVENOUS

## 2014-07-06 MED ORDER — DEXTROSE 50 % IV SOLN
INTRAVENOUS | Status: AC
  Filled 2014-07-06: qty 50

## 2014-07-06 NOTE — ED Notes (Signed)
Family at bedside; states pt was unresponsive with the right side of the body cold to touch and the left side warm. Once pt given D50 by EMS; pt was alert and able to move extremities.

## 2014-07-06 NOTE — ED Notes (Signed)
Pt given graham crackers and orange juice per Dr. Tomi Bamberger due to pt's blood sugar and not having had supper tonight.

## 2014-07-06 NOTE — ED Notes (Signed)
MD at bedside. 

## 2014-07-06 NOTE — Discharge Instructions (Signed)
Blood Glucose Monitoring °Monitoring your blood glucose (also know as blood sugar) helps you to manage your diabetes. It also helps you and your health care provider monitor your diabetes and determine how well your treatment plan is working. °WHY SHOULD YOU MONITOR YOUR BLOOD GLUCOSE? °· It can help you understand how food, exercise, and medicine affect your blood glucose. °· It allows you to know what your blood glucose is at any given moment. You can quickly tell if you are having low blood glucose (hypoglycemia) or high blood glucose (hyperglycemia). °· It can help you and your health care provider know how to adjust your medicines. °· It can help you understand how to manage an illness or adjust medicine for exercise. °WHEN SHOULD YOU TEST? °Your health care provider will help you decide how often you should check your blood glucose. This may depend on the type of diabetes you have, your diabetes control, or the types of medicines you are taking. Be sure to write down all of your blood glucose readings so that this information can be reviewed with your health care provider. See below for examples of testing times that your health care provider may suggest. °Type 1 Diabetes °· Test 4 times a day if you are in good control, using an insulin pump, or perform multiple daily injections. °· If your diabetes is not well controlled or if you are sick, you may need to monitor more often. °· It is a good idea to also monitor: °· Before and after exercise. °· Between meals and 2 hours after a meal. °· Occasionally between 2:00 a.m. and 3:00 a.m. °Type 2 Diabetes °· It can vary with each person, but generally, if you are on insulin, test 4 times a day. °· If you take medicines by mouth (orally), test 2 times a day. °· If you are on a controlled diet, test once a day. °· If your diabetes is not well controlled or if you are sick, you may need to monitor more often. °HOW TO MONITOR YOUR BLOOD GLUCOSE °Supplies  Needed °· Blood glucose meter. °· Test strips for your meter. Each meter has its own strips. You must use the strips that go with your own meter. °· A pricking needle (lancet). °· A device that holds the lancet (lancing device). °· A journal or log book to write down your results. °Procedure °· Wash your hands with soap and water. Alcohol is not preferred. °· Prick the side of your finger (not the tip) with the lancet. °· Gently milk the finger until a small drop of blood appears. °· Follow the instructions that come with your meter for inserting the test strip, applying blood to the strip, and using your blood glucose meter. °Other Areas to Get Blood for Testing °Some meters allow you to use other areas of your body (other than your finger) to test your blood. These areas are called alternative sites. The most common alternative sites are: °· The forearm. °· The thigh. °· The back area of the lower leg. °· The palm of the hand. °The blood flow in these areas is slower. Therefore, the blood glucose values you get may be delayed, and the numbers are different from what you would get from your fingers. Do not use alternative sites if you think you are having hypoglycemia. Your reading will not be accurate. Always use a finger if you are having hypoglycemia. Also, if you cannot feel your lows (hypoglycemia unawareness), always use your fingers for your   blood glucose checks. °ADDITIONAL TIPS FOR GLUCOSE MONITORING °· Do not reuse lancets. °· Always carry your supplies with you. °· All blood glucose meters have a 24-hour "hotline" number to call if you have questions or need help. °· Adjust (calibrate) your blood glucose meter with a control solution after finishing a few boxes of strips. °BLOOD GLUCOSE RECORD KEEPING °It is a good idea to keep a daily record or log of your blood glucose readings. Most glucose meters, if not all, keep your glucose records stored in the meter. Some meters come with the ability to download  your records to your home computer. Keeping a record of your blood glucose readings is especially helpful if you are wanting to look for patterns. Make notes to go along with the blood glucose readings because you might forget what happened at that exact time. Keeping good records helps you and your health care provider to work together to achieve good diabetes management.  °Document Released: 10/19/2003 Document Revised: 03/02/2014 Document Reviewed: 03/10/2013 °[employer]® Patient Information ©2015 [employer]. This information is not intended to replace advice given to you by your health care provider. Make sure you discuss any questions you have with your health care provider. ° °Hypoglycemia °Hypoglycemia occurs when the glucose in your blood is too low. Glucose is a type of sugar that is your body's main energy source. Hormones, such as insulin and glucagon, control the level of glucose in the blood. Insulin lowers blood glucose and glucagon increases blood glucose. Having too much insulin in your blood stream, or not eating enough food containing sugar, can result in hypoglycemia. Hypoglycemia can happen to people with or without diabetes. It can develop quickly and can be a medical emergency.  °CAUSES  °· Missing or delaying meals. °· Not eating enough carbohydrates at meals. °· Taking too much diabetes medicine. °· Not timing your oral diabetes medicine or insulin doses with meals, snacks, and exercise. °· Nausea and vomiting. °· Certain medicines. °· Severe illnesses, such as hepatitis, kidney disorders, and certain eating disorders. °· Increased activity or exercise without eating something extra or adjusting medicines. °· Drinking too much alcohol. °· A nerve disorder that affects body functions like your heart rate, blood pressure, and digestion (autonomic neuropathy). °· A condition where the stomach muscles do not function properly (gastroparesis). Therefore, medicines and food may not absorb  properly. °· Rarely, a tumor of the pancreas can produce too much insulin. °SYMPTOMS  °· Hunger. °· Sweating (diaphoresis). °· Change in body temperature. °· Shakiness. °· Headache. °· Anxiety. °· Lightheadedness. °· Irritability. °· Difficulty concentrating. °· Dry mouth. °· Tingling or numbness in the hands or feet. °· Restless sleep or sleep disturbances. °· Altered speech and coordination. °· Change in mental status. °· Seizures or prolonged convulsions. °· Combativeness. °· Drowsiness (lethargic). °· Weakness. °· Increased heart rate or palpitations. °· Confusion. °· Pale, gray skin color. °· Blurred or double vision. °· Fainting. °DIAGNOSIS  °A physical exam and medical history will be performed. Your caregiver may make a diagnosis based on your symptoms. Blood tests and other lab tests may be performed to confirm a diagnosis. Once the diagnosis is made, your caregiver will see if your signs and symptoms go away once your blood glucose is raised.  °TREATMENT  °Usually, you can easily treat your hypoglycemia when you notice symptoms. °· Check your blood glucose. If it is less than 70 mg/dl, take one of the following:   °¨ 3-4 glucose tablets.   °¨ ½ cup   juice.   °¨ ½ cup regular soda.   °¨ 1 cup skim milk.   °¨ ½-1 tube of glucose gel.   °¨ 5-6 hard candies.   °· Avoid high-fat drinks or food that may delay a rise in blood glucose levels. °· Do not take more than the recommended amount of sugary foods, drinks, gel, or tablets. Doing so will cause your blood glucose to go too high.   °· Wait 10-15 minutes and recheck your blood glucose. If it is still less than 70 mg/dl or below your target range, repeat treatment.   °· Eat a snack if it is more than 1 hour until your next meal.   °There may be a time when your blood glucose may go so low that you are unable to treat yourself at home when you start to notice symptoms. You may need someone to help you. You may even faint or be unable to swallow. If you cannot  treat yourself, someone will need to bring you to the hospital.  °HOME CARE INSTRUCTIONS °· If you have diabetes, follow your diabetes management plan by: °¨ Taking your medicines as directed. °¨ Following your exercise plan. °¨ Following your meal plan. Do not skip meals. Eat on time. °¨ Testing your blood glucose regularly. Check your blood glucose before and after exercise. If you exercise longer or different than usual, be sure to check blood glucose more frequently. °¨ Wearing your medical alert jewelry that says you have diabetes. °· Identify the cause of your hypoglycemia. Then, develop ways to prevent the recurrence of hypoglycemia. °· Do not take a hot bath or shower right after an insulin shot. °· Always carry treatment with you. Glucose tablets are the easiest to carry. °· If you are going to drink alcohol, drink it only with meals. °· Tell friends or family members ways to keep you safe during a seizure. This may include removing hard or sharp objects from the area or turning you on your side. °· Maintain a healthy weight. °SEEK MEDICAL CARE IF:  °· You are having problems keeping your blood glucose in your target range. °· You are having frequent episodes of hypoglycemia. °· You feel you might be having side effects from your medicines. °· You are not sure why your blood glucose is dropping so low. °· You notice a change in vision or a new problem with your vision. °SEEK IMMEDIATE MEDICAL CARE IF:  °· Confusion develops. °· A change in mental status occurs. °· The inability to swallow develops. °· Fainting occurs. °Document Released: 10/16/2005 Document Revised: 10/21/2013 Document Reviewed: 02/12/2012 °[employer]® Patient Information ©2015 [employer]. This information is not intended to replace advice given to you by your health care provider. Make sure you discuss any questions you have with your health care provider. ° °

## 2014-07-06 NOTE — ED Notes (Signed)
Pt reports his blood sugar was high prior to lunch so he took 12 units of insulin instead of his 6 units of insulin and then he ate lunch. Has not eaten dinner this evening. CBG at 69 at time of arrival.

## 2014-07-06 NOTE — ED Provider Notes (Signed)
CSN: [id number]     Arrival date & time 07/06/14  1915 History   First MD Initiated Contact with Patient 07/06/14 1916     Chief Complaint  Patient presents with  . Altered Mental Status    HPI Patient presents to the emergency room with an episode of altered mental status. Patient states earlier in the day today which is elevated. He took additional NovoLog insulin.  This evening family noticed that the patient was unresponsive. He was having difficulty with his speech. The right side of his body felt cold. EMS was contacted. To check the patient's blood sugar and it was 60. He was given one half amp of D50 and following that his symptoms completely resolved.  Patient now denies any symptoms. He's not having any headache or confusion. He denies any numbness or weakness .  He has had episodes of low blood sugar in the past but has never had this type of response. Past Medical History  Diagnosis Date  . Hypertension   . Hyperlipidemia   . Type 2 Diabetes mellitus   . Necrosis     #2 nail   . CKD (chronic kidney disease) 06/28/2014   Past Surgical History  Procedure Laterality Date  . Above knee leg amputation  2004  . Cataract extraction w/phaco  09/05/2012    Procedure: CATARACT EXTRACTION PHACO AND INTRAOCULAR LENS PLACEMENT (IOC);  Surgeon: Tonny Branch, MD;  Location: [facility];  Service: Ophthalmology;  Laterality: Left;  CDE=5.45  . Cataract extraction w/phaco  10/03/2012    Procedure: CATARACT EXTRACTION PHACO AND INTRAOCULAR LENS PLACEMENT (IOC);  Surgeon: Tonny Branch, MD;  Location: [facility];  Service: Ophthalmology;  Laterality: Right;  CDE: 12.31   Family History  Problem Relation Age of Onset  . Diabetes Mother   . Hypertension Mother   . Cancer Father   . Cancer Sister   . Sickle cell anemia Daughter    History  Substance Use Topics  . Smoking status: Never Smoker   . Smokeless tobacco: Never Used  . Alcohol Use: No    Review of Systems  All other systems reviewed and are  negative.     Allergies  Zolpidem tartrate  Home Medications   Prior to Admission medications   Medication Sig Start Date End Date Taking? Authorizing Provider  acetaminophen (TYLENOL) 500 MG tablet Take 500-1,500 mg by mouth every 6 (six) hours as needed for mild pain or moderate pain.   Yes Historical Provider, MD  atenolol (TENORMIN) 25 MG tablet Take 1 tablet (25 mg total) by mouth daily. 05/15/14  Yes Wardell Honour, MD  Calcium Carb-Cholecalciferol (CALCIUM PLUS VITAMIN D3) 600-500 MG-UNIT CAPS Take 1 capsule by mouth daily.    Yes Historical Provider, MD  cilostazol (PLETAL) 100 MG tablet Take 1 tablet (100 mg total) by mouth 2 (two) times daily. 05/15/14  Yes Wardell Honour, MD  clopidogrel (PLAVIX) 75 MG tablet Take 1 tablet (75 mg total) by mouth daily. 06/29/14  Yes Eugenie Filler, MD  exenatide (BYETTA) 5 MCG/0.02ML SOPN injection Inject 10 mcg into the skin 2 (two) times daily with a meal.   Yes Historical Provider, MD  furosemide (LASIX) 40 MG tablet Take 40 mg by mouth daily. 05/15/14  Yes Wardell Honour, MD  insulin aspart (NOVOLOG) 100 UNIT/ML injection Inject 8 Units into the skin 3 (three) times daily before meals.   Yes Historical Provider, MD  insulin detemir (LEVEMIR) 100 UNIT/ML injection Inject 26 Units into the  skin at bedtime.    Yes Historical Provider, MD  rosuvastatin (CRESTOR) 20 MG tablet Take 1 tablet (20 mg total) by mouth daily. 05/15/14  Yes Wardell Honour, MD   BP 121/81  Pulse 62  Temp(Src) 97.8 F (36.6 C) (Oral)  Resp 18  Ht 6' (1.829 m)  Wt 256 lb (116.121 kg)  BMI 34.71 kg/m2  SpO2 97% Physical Exam  Nursing note and vitals reviewed. Constitutional: He appears well-developed and well-nourished. No distress.  HENT:  Head: Normocephalic and atraumatic.  Right Ear: External ear normal.  Left Ear: External ear normal.  Eyes: Conjunctivae are normal. Right eye exhibits no discharge. Left eye exhibits no discharge. No scleral icterus.   Neck: Neck supple. No tracheal deviation present.  Cardiovascular: Normal rate, regular rhythm and intact distal pulses.   Pulmonary/Chest: Effort normal and breath sounds normal. No stridor. No respiratory distress. He has no wheezes. He has no rales.  Abdominal: Soft. Bowel sounds are normal. He exhibits no distension. There is no tenderness. There is no rebound and no guarding.  Musculoskeletal: He exhibits no edema and no tenderness.  Status post amputation left lower extremity  Neurological: He is alert. He has normal strength. No cranial nerve deficit (no facial droop, extraocular movements intact, no slurred speech) or sensory deficit. He exhibits normal muscle tone. He displays no seizure activity. Coordination normal.  Skin: Skin is warm and dry. No rash noted.  Psychiatric: He has a normal mood and affect.    ED Course  Procedures (including critical care time) Labs Review Labs Reviewed  DIFFERENTIAL - Abnormal; Notable for the following:    Neutrophils Relative % 79 (*)    All other components within normal limits  COMPREHENSIVE METABOLIC PANEL - Abnormal; Notable for the following:    Glucose, Bld 185 (*)    BUN 27 (*)    Creatinine, Ser 1.86 (*)    Albumin 3.4 (*)    GFR calc non Af Amer 37 (*)    GFR calc Af Amer 43 (*)    All other components within normal limits  CBG MONITORING, ED - Abnormal; Notable for the following:    Glucose-Capillary 69 (*)    All other components within normal limits  CBG MONITORING, ED - Abnormal; Notable for the following:    Glucose-Capillary 164 (*)    All other components within normal limits  CBG MONITORING, ED - Abnormal; Notable for the following:    Glucose-Capillary 181 (*)    All other components within normal limits  ETHANOL  PROTIME-INR  APTT  CBC  TROPONIN I  CBG MONITORING, ED    Imaging Review Ct Head Wo Contrast  07/06/2014   CLINICAL DATA:  Right-sided weakness, altered mental status  EXAM: CT HEAD WITHOUT  CONTRAST  TECHNIQUE: Contiguous axial images were obtained from the base of the skull through the vertex without intravenous contrast.  COMPARISON:  MRI and CT 06/27/2014  FINDINGS: Left occipital scalp stranding is stable, image 8. Periventricular white matter hypodensity likely indicating small vessel ischemic change again noted. No acute hemorrhage, infarct, or mass lesion is identified. No midline shift. No skull fracture. Orbits and paranasal sinuses are unremarkable.  IMPRESSION: No acute intracranial finding.   Electronically Signed   By: Conchita Paris M.D.   On: 07/06/2014 20:12     EKG Interpretation   Date/Time:  Monday July 06 2014 19:50:01 EDT Ventricular Rate:  63 PR Interval:  59 QRS Duration: 89 QT Interval:  432 QTC  Calculation: 442 R Axis:   60 Text Interpretation:  Sinus rhythm No significant change since last  tracing Confirmed by Jamilla Galli  MD-J, Daxen Lanum [id number]) on 07/06/2014 8:45:24 PM     Medications  dextrose 50 % solution 50 mL (50 mLs Intravenous Given 07/06/14 1948)    MDM   Final diagnoses:  Hypoglycemia    Patient was monitored in the emergency department. Had no recurrent episodes of hypoglycemia. He was given food while in the emergency department.  I doubt TIA or stroke. His symptoms improved after EMS administered D50.  Encouraged patient to eat something tonight before he takes his insulin again. Monitor his blood sugars closely. Followup with his doctor next week    Dorie Rank, MD 07/06/14 2207

## 2014-07-06 NOTE — ED Notes (Signed)
EMS called out for stroke when they arrived pt was unresponsive and pt's blood sugar was 60 and 0.5 amp D50 given and since pt is alert and oriented.

## 2014-07-13 ENCOUNTER — Ambulatory Visit: Payer: BC Managed Care – PPO | Admitting: Occupational Therapy

## 2014-07-13 ENCOUNTER — Ambulatory Visit: Payer: BC Managed Care – PPO | Attending: Family Medicine

## 2014-07-13 DIAGNOSIS — Z5189 Encounter for other specified aftercare: Secondary | ICD-10-CM | POA: Diagnosis not present

## 2014-07-13 DIAGNOSIS — R41842 Visuospatial deficit: Secondary | ICD-10-CM | POA: Diagnosis not present

## 2014-07-13 DIAGNOSIS — M6281 Muscle weakness (generalized): Secondary | ICD-10-CM | POA: Diagnosis not present

## 2014-07-13 DIAGNOSIS — R279 Unspecified lack of coordination: Secondary | ICD-10-CM | POA: Insufficient documentation

## 2014-07-16 ENCOUNTER — Ambulatory Visit: Payer: BC Managed Care – PPO | Admitting: *Deleted

## 2014-07-16 VITALS — BP 123/74 | HR 65

## 2014-07-16 DIAGNOSIS — Z0000 Encounter for general adult medical examination without abnormal findings: Secondary | ICD-10-CM

## 2014-07-16 MED ORDER — CLOPIDOGREL BISULFATE 75 MG PO TABS: 75.0000 mg | ORAL_TABLET | Freq: Every day | ORAL | Status: DC

## 2014-07-16 NOTE — Progress Notes (Signed)
Patient came in today for blood pressure check. He is also requesting a refill on Plavix. Dr Estanislado Pandy has recommended angioplasty. His nephrologist would like for him to have a second opinion. We will need to refer him. He also wanted to follow up on his sleep apnea. He had a CPAP but hasn't used it in years. It was never titrated. He just adjusted the settings himself.   Blood pressure within normal limits. It as normal at his last visit as well.  Patient will follow up as needed. Plavix ordered.  Advised that we may be able to schedule him for a home sleep study. The newer CPAPs are self titrating.  I will check into this and the referral and will call the within a few days.

## 2014-07-20 ENCOUNTER — Ambulatory Visit: Payer: BC Managed Care – PPO | Admitting: Occupational Therapy

## 2014-07-20 ENCOUNTER — Ambulatory Visit: Payer: BC Managed Care – PPO

## 2014-07-20 ENCOUNTER — Encounter (HOSPITAL_COMMUNITY): Payer: Self-pay | Admitting: Emergency Medicine

## 2014-07-20 ENCOUNTER — Emergency Department (HOSPITAL_COMMUNITY)
Admission: EM | Admit: 2014-07-20 | Discharge: 2014-07-20 | Disposition: A | Discharge status: Home / Self Care | Payer: BC Managed Care – PPO | Attending: Emergency Medicine | Admitting: Emergency Medicine

## 2014-07-20 DIAGNOSIS — Z5189 Encounter for other specified aftercare: Secondary | ICD-10-CM | POA: Diagnosis not present

## 2014-07-20 DIAGNOSIS — Z794 Long term (current) use of insulin: Secondary | ICD-10-CM | POA: Insufficient documentation

## 2014-07-20 DIAGNOSIS — I129 Hypertensive chronic kidney disease with stage 1 through stage 4 chronic kidney disease, or unspecified chronic kidney disease: Secondary | ICD-10-CM | POA: Diagnosis not present

## 2014-07-20 DIAGNOSIS — Z79899 Other long term (current) drug therapy: Secondary | ICD-10-CM | POA: Diagnosis not present

## 2014-07-20 DIAGNOSIS — E785 Hyperlipidemia, unspecified: Secondary | ICD-10-CM | POA: Diagnosis not present

## 2014-07-20 DIAGNOSIS — E119 Type 2 diabetes mellitus without complications: Secondary | ICD-10-CM | POA: Diagnosis present

## 2014-07-20 DIAGNOSIS — E162 Hypoglycemia, unspecified: Secondary | ICD-10-CM

## 2014-07-20 DIAGNOSIS — N189 Chronic kidney disease, unspecified: Secondary | ICD-10-CM | POA: Diagnosis not present

## 2014-07-20 LAB — CBG MONITORING, ED
GLUCOSE-CAPILLARY: 110 mg/dL — AB (ref 70–99)
Glucose-Capillary: 56 mg/dL — ABNORMAL LOW (ref 70–99)
Glucose-Capillary: 69 mg/dL — ABNORMAL LOW (ref 70–99)

## 2014-07-20 NOTE — Discharge Instructions (Signed)
Glucose is still in the low 100 range.   Eat plenty of calories tonight. Reduce or eliminate nighttime insulin.   Check your glucoses frequently

## 2014-07-20 NOTE — ED Notes (Signed)
Pt had 2 oz grape juice, 4 oz orange juice, 2 oz Coke and 4 crackers. cbg-56. Pt alert and oriented in triage drinking OJ and eating peanut butter crackers. nad noted.

## 2014-07-20 NOTE — ED Provider Notes (Signed)
CSN: [id number]     Arrival date & time 07/20/14  1617 History  This chart was scribed for Nat Christen, MD by Evelene Croon, ED Scribe. This patient was seen in room [facility] and the patient's care was started 4:37 PM.    Chief Complaint  Patient presents with  . Hypoglycemia    HPI  HPI Comments:  [name] is a 63 y.o. male with a h/o NIDDM who presents to the Emergency Department with his wife. Wife states pt appeared  tired and was not talking much after she picked him up from physical therapy this afternoon so she brought him to be evaluated. Glucose was low at bedtime. Wife sts BS is normally well-controlled.  pt was given a few crackers at bedside which wife sts has helped.  Pt has no physical complaints at this time. No associated symptoms noted. He has had a rare episode of hypoglycemia in the past   Past Medical History  Diagnosis Date  . Hypertension   . Hyperlipidemia   . Type 2 Diabetes mellitus   . Necrosis     #2 nail   . CKD (chronic kidney disease) 06/28/2014   Past Surgical History  Procedure Laterality Date  . Above knee leg amputation  2004  . Cataract extraction w/phaco  09/05/2012    Procedure: CATARACT EXTRACTION PHACO AND INTRAOCULAR LENS PLACEMENT (IOC);  Surgeon: Tonny Branch, MD;  Location: [facility];  Service: Ophthalmology;  Laterality: Left;  CDE=5.45  . Cataract extraction w/phaco  10/03/2012    Procedure: CATARACT EXTRACTION PHACO AND INTRAOCULAR LENS PLACEMENT (IOC);  Surgeon: Tonny Branch, MD;  Location: [facility];  Service: Ophthalmology;  Laterality: Right;  CDE: 12.31   Family History  Problem Relation Age of Onset  . Diabetes Mother   . Hypertension Mother   . Cancer Father   . Cancer Sister   . Sickle cell anemia Daughter    History  Substance Use Topics  . Smoking status: Never Smoker   . Smokeless tobacco: Never Used  . Alcohol Use: No    Review of Systems At least 10pt or greater review of systems completed and are negative except where  specified in the HPI.   Allergies  Zolpidem tartrate  Home Medications   Prior to Admission medications   Medication Sig Start Date End Date Taking? Authorizing Provider  acetaminophen (TYLENOL) 500 MG tablet Take 500-1,500 mg by mouth every 6 (six) hours as needed for mild pain or moderate pain.   Yes Historical Provider, MD  atenolol (TENORMIN) 25 MG tablet Take 1 tablet (25 mg total) by mouth daily. 05/15/14  Yes Wardell Honour, MD  Calcium Carb-Cholecalciferol (CALCIUM PLUS VITAMIN D3) 600-500 MG-UNIT CAPS Take 1 capsule by mouth daily.    Yes Historical Provider, MD  cilostazol (PLETAL) 100 MG tablet Take 1 tablet (100 mg total) by mouth 2 (two) times daily. 05/15/14  Yes Wardell Honour, MD  clopidogrel (PLAVIX) 75 MG tablet Take 1 tablet (75 mg total) by mouth daily. 07/16/14  Yes Wardell Honour, MD  exenatide (BYETTA) 5 MCG/0.02ML SOPN injection Inject 10 mcg into the skin 2 (two) times daily with a meal.   Yes Historical Provider, MD  furosemide (LASIX) 40 MG tablet Take 40 mg by mouth daily. 05/15/14  Yes Wardell Honour, MD  insulin aspart (NOVOLOG) 100 UNIT/ML injection Inject 10 Units into the skin 3 (three) times daily before meals.    Yes Historical Provider, MD  insulin detemir (LEVEMIR)  100 UNIT/ML injection Inject 26 Units into the skin at bedtime.    Yes Historical Provider, MD  rosuvastatin (CRESTOR) 20 MG tablet Take 1 tablet (20 mg total) by mouth daily. 05/15/14  Yes Wardell Honour, MD   BP 110/70  Pulse 68  Temp(Src) 97.8 F (36.6 C) (Oral)  Resp 16  Ht 6' (1.829 m)  Wt 255 lb (115.667 kg)  BMI 34.58 kg/m2  SpO2 100% Physical Exam  Nursing note and vitals reviewed. Constitutional: He is oriented to person, place, and time. He appears well-developed and well-nourished.  HENT:  Head: Normocephalic and atraumatic.  Eyes: Conjunctivae and EOM are normal. Pupils are equal, round, and reactive to light.  Neck: Normal range of motion. Neck supple.   Cardiovascular: Normal rate, regular rhythm and normal heart sounds.   Pulmonary/Chest: Effort normal and breath sounds normal.  Abdominal: Soft. Bowel sounds are normal.  Musculoskeletal: Normal range of motion.  Left AKA with prosthesis   Neurological: He is alert and oriented to person, place, and time.  Skin: Skin is warm and dry.  Psychiatric: He has a normal mood and affect. His behavior is normal.    ED Course  Procedures   DIAGNOSTIC STUDIES:  Oxygen Saturation is 100% on RA, normal by my interpretation.    COORDINATION OF CARE:  4:43 PM Will recheck BS now that the pt has eaten some crackers. Discussed treatment plan with pt and wife at bedside and they agreed to plan.   Labs Review Labs Reviewed  CBG MONITORING, ED - Abnormal; Notable for the following:    Glucose-Capillary 56 (*)    All other components within normal limits  CBG MONITORING, ED - Abnormal; Notable for the following:    Glucose-Capillary 69 (*)    All other components within normal limits  CBG MONITORING, ED - Abnormal; Notable for the following:    Glucose-Capillary 110 (*)    All other components within normal limits    Imaging Review No results found.   EKG Interpretation None      MDM   Final diagnoses:  Hypoglycemia    Patient is back to baseline. Glucose has improved. I recommended to hold his evening insulin. Wife and patient are agreeable with this plan  I personally performed the services described in this documentation, which was scribed in my presence. The recorded information has been reviewed and is accurate.    Nat Christen, MD 07/21/14 [phone]

## 2014-07-20 NOTE — ED Notes (Signed)
Pt was picked up by wife at therapy at 1500 and was acting disoriented and c/o feeling tired. (lsn by wife 1315). Pt states he feels tired.

## 2014-07-20 NOTE — ED Notes (Signed)
Per pt ate about 50 % of meal provided

## 2014-07-22 ENCOUNTER — Ambulatory Visit: Payer: BC Managed Care – PPO | Admitting: Occupational Therapy

## 2014-07-22 ENCOUNTER — Ambulatory Visit: Payer: BC Managed Care – PPO

## 2014-07-22 DIAGNOSIS — Z5189 Encounter for other specified aftercare: Secondary | ICD-10-CM | POA: Diagnosis not present

## 2014-07-27 ENCOUNTER — Ambulatory Visit: Payer: BC Managed Care – PPO

## 2014-07-27 ENCOUNTER — Ambulatory Visit: Payer: BC Managed Care – PPO | Admitting: Occupational Therapy

## 2014-07-27 DIAGNOSIS — Z5189 Encounter for other specified aftercare: Secondary | ICD-10-CM | POA: Diagnosis not present

## 2014-07-29 ENCOUNTER — Ambulatory Visit: Payer: BC Managed Care – PPO | Admitting: Nutrition

## 2014-07-30 ENCOUNTER — Other Ambulatory Visit: Payer: Self-pay | Admitting: *Deleted

## 2014-07-30 ENCOUNTER — Ambulatory Visit: Payer: BC Managed Care – PPO | Attending: Family Medicine

## 2014-07-30 ENCOUNTER — Ambulatory Visit: Payer: BC Managed Care – PPO | Admitting: Occupational Therapy

## 2014-07-30 DIAGNOSIS — G454 Transient global amnesia: Secondary | ICD-10-CM | POA: Insufficient documentation

## 2014-07-30 DIAGNOSIS — R279 Unspecified lack of coordination: Secondary | ICD-10-CM | POA: Insufficient documentation

## 2014-07-30 DIAGNOSIS — Z89612 Acquired absence of left leg above knee: Secondary | ICD-10-CM | POA: Insufficient documentation

## 2014-07-30 DIAGNOSIS — Z8673 Personal history of transient ischemic attack (TIA), and cerebral infarction without residual deficits: Secondary | ICD-10-CM | POA: Diagnosis not present

## 2014-07-30 DIAGNOSIS — M6281 Muscle weakness (generalized): Secondary | ICD-10-CM | POA: Diagnosis not present

## 2014-07-30 DIAGNOSIS — G4733 Obstructive sleep apnea (adult) (pediatric): Secondary | ICD-10-CM

## 2014-07-30 DIAGNOSIS — R41842 Visuospatial deficit: Secondary | ICD-10-CM | POA: Diagnosis not present

## 2014-07-30 NOTE — Progress Notes (Signed)
Sleep study order placed. Who would you recommend he see for a 2nd opinion about needing an angioplasty?

## 2014-08-04 ENCOUNTER — Ambulatory Visit: Payer: BC Managed Care – PPO

## 2014-08-04 ENCOUNTER — Encounter: Payer: Self-pay | Admitting: Neurology

## 2014-08-04 ENCOUNTER — Encounter (INDEPENDENT_AMBULATORY_CARE_PROVIDER_SITE_OTHER): Payer: Self-pay

## 2014-08-04 ENCOUNTER — Ambulatory Visit: Payer: BC Managed Care – PPO | Admitting: Occupational Therapy

## 2014-08-04 ENCOUNTER — Ambulatory Visit (INDEPENDENT_AMBULATORY_CARE_PROVIDER_SITE_OTHER): Payer: BC Managed Care – PPO | Admitting: Neurology

## 2014-08-04 VITALS — BP 106/73 | HR 77 | Ht 72.0 in | Wt 250.0 lb

## 2014-08-04 DIAGNOSIS — G454 Transient global amnesia: Secondary | ICD-10-CM | POA: Diagnosis not present

## 2014-08-04 DIAGNOSIS — F05 Delirium due to known physiological condition: Secondary | ICD-10-CM | POA: Insufficient documentation

## 2014-08-04 DIAGNOSIS — E1159 Type 2 diabetes mellitus with other circulatory complications: Secondary | ICD-10-CM

## 2014-08-04 DIAGNOSIS — E785 Hyperlipidemia, unspecified: Secondary | ICD-10-CM

## 2014-08-04 DIAGNOSIS — Z89519 Acquired absence of unspecified leg below knee: Secondary | ICD-10-CM | POA: Insufficient documentation

## 2014-08-04 NOTE — Progress Notes (Signed)
PT/OT last day  Not to drive by PT/OT in hospital    STROKE NEUROLOGY FOLLOW UP NOTE  NAME: [name] DOB: 12-Oct-1951  REASON FOR VISIT: stroke follow up HISTORY FROM: chart and pt  Today we had the pleasure of seeing [name] in follow-up at our Neurology Clinic. Pt was accompanied by wife.   History Summary [name] is a 63 y.o. male with hx of hypertension, hyperlipidemia, and diabetes mellitus was admitted on 06/26/14 for  transient confusion. He was driving in the evening but suddenly felt everything was unfamiliar and he lot his way home. It resolved in ER. Glucose 82 in ER. Denies any headache, weakness, numbness. MRI negative for stroke and MRA showed distal right ICA 75% stenosis but carotid doppler no significant stenosis. EEG negative. Etiology is unclear but the differential diagnosis includes TIA due to multiple risk factors, transient global amnesia, seizures. He was on ASA prior to admission and he was discharged with plavix.  Interval History During the interval time, the patient has been doing well. Still on outpt PT/OT and today is the last day. He has no recurrent symptoms. No driving yet. He stated that his DM in good control and this am glucose 86.   REVIEW OF SYSTEMS: Full 14 system review of systems performed and notable only for those listed below and in HPI above, all others are negative:  Constitutional: N/A  Cardiovascular: N/A  Ear/Nose/Throat: N/A  Skin: N/A  Eyes: N/A  Respiratory: N/A  Gastroitestinal: N/A  Genitourinary: N/A Hematology/Lymphatic: N/A  Endocrine: N/A  Musculoskeletal: N/A  Allergy/Immunology: N/A  Neurological: confusion  Psychiatric: N/A  The following represents the patient's updated allergies and side effects list: Allergies  Allergen Reactions  . Zolpidem Tartrate Other (See Comments)    disorientation     Labs since last visit of relevance include the following: Results for orders placed during the hospital  encounter of 07/20/14  CBG MONITORING, ED      Result Value Ref Range   Glucose-Capillary 56 (*) 70 - 99 mg/dL  CBG MONITORING, ED      Result Value Ref Range   Glucose-Capillary 69 (*) 70 - 99 mg/dL  CBG MONITORING, ED      Result Value Ref Range   Glucose-Capillary 110 (*) 70 - 99 mg/dL    The neurologically relevant items on the patient's problem list were reviewed on today's visit.  Neurologic Examination  A problem focused neurological exam (12 or more points of the single system neurologic examination, vital signs counts as 1 point, cranial nerves count for 8 points) was performed.  Blood pressure 106/73, pulse 77, height 6' (1.829 m), weight 250 lb (113.399 kg).  General - Well nourished, well developed, in no apparent distress.  Ophthalmologic - not able to see through.  Cardiovascular - Regular rate and rhythm with no murmur.  Mental Status -  Level of arousal and orientation to time, place, and person were intact. Language including expression, naming, repetition, comprehension was assessed and found intact. Attention span and concentration were normal. Recent and remote memory were tested and registration 3/3 and delayed recall 1.5/3. Fund of Knowledge was assessed and was intact.  Cranial Nerves II - XII - II - mild tunnel vision bilaterally. III, IV, VI - Extraocular movements intact. V - Facial sensation intact bilaterally. VII - Facial movement intact bilaterally. VIII - Hearing & vestibular intact bilaterally. X - Palate elevates symmetrically. XI - Chin turning & shoulder shrug  intact bilaterally. XII - Tongue protrusion intact.  Motor Strength - The patient's strength was normal in all extremities except distal LE as he has left AKA. Pronator drift was absent.  Bulk was normal and fasciculations were absent.   Motor Tone - Muscle tone was assessed at the neck and appendages and was normal.  Reflexes - The patient's reflexes were 1+ in all extremities  (not able to check reflex at LLE) and he had no pathological reflexes.  Sensory - Light touch, temperature/pinprick were assessed and were normal.    Coordination - The patient had normal movements in the hands with no ataxia or dysmetria.  Tremor was absent.  Gait and Station - walk with cane due to LLE AKA.  Data reviewed: I personally reviewed the images and agree with the radiology interpretations.  Ct Head Wo Contrast  06/27/2014  No acute intracranial abnormalities.  Mri Brain Without Contrast  06/27/2014  No acute stroke. Chronic changes as described.  Mr Jodene Nam Head/brain Wo Cm  06/27/2014  Potentially flow limiting stenoses of the skull base internal carotid arteries, RIGHT greater than LEFT. No proximal MCA narrowing.  2D Echocardiogram EF 65-70% with no source of embolus. Impressions: Normal left ventricular size and systolic function  Carotid Doppler No evidence of hemodynamically significant internal carotid artery stenosis. Vertebral artery flow is antegrade.  EEG is normal.   LDL 62 and A1C 6.0  Assessment: As you may recall, he is a 63 y.o. African American male with PMH of hypertension, hyperlipidemia, and diabetes mellitus was admitted on 06/26/14 for  transient confusion. Work up all negative including EEG and MRI. Only MRA showed right distal ICA 75% stenosis, no intervention indicated. Etiology of his confusion episode not clear, DDx include encephalopathy, TGA, seizure and TIA. Will continue plavix and crestor for stroke prevention. He is also on cilostazol for PVD. Follow up with PCP for stroke risk factor modification. He has mild tunnel vision bilaterally but his OT assessment in hospital showed within functional limit. He stated his vision is better for the last one month. OK to resume driving as long as both wife and pt feel comfortable.  Plan:  - continue plavix and crestor for stroke prevention - follow up with PCP for stroke risk factor modification - OK to  continue cilostazol for PVD - PT/OT - OK to resume driving as long as wife and self comfortable - RTC in 6 months.  No orders of the defined types were placed in this encounter.   No orders of the defined types were placed in this encounter.    Patient Instructions  - continue plavix and crestor for stroke prevention - follow up with PCP for stroke risk factor modification - OK to resume driving. Start driving with wife on board and feel comfortable from both. - continue PT/OT  - follow up in 6 months.   Rosalin Hawking, MD PhD [facility] [address], [place], [place] [address] [phone]

## 2014-08-04 NOTE — Patient Instructions (Signed)
-   continue plavix and crestor for stroke prevention - follow up with PCP for stroke risk factor modification - OK to resume driving. Start driving with wife on board and feel comfortable from both. - continue PT/OT  - follow up in 6 months.

## 2014-08-06 ENCOUNTER — Ambulatory Visit: Payer: BC Managed Care – PPO

## 2014-08-10 ENCOUNTER — Encounter: Payer: BC Managed Care – PPO | Attending: "Endocrinology | Admitting: Nutrition

## 2014-08-10 VITALS — Ht 72.0 in | Wt 252.0 lb

## 2014-08-10 DIAGNOSIS — E1129 Type 2 diabetes mellitus with other diabetic kidney complication: Secondary | ICD-10-CM | POA: Insufficient documentation

## 2014-08-10 DIAGNOSIS — IMO0001 Reserved for inherently not codable concepts without codable children: Secondary | ICD-10-CM

## 2014-08-10 DIAGNOSIS — E1165 Type 2 diabetes mellitus with hyperglycemia: Secondary | ICD-10-CM

## 2014-08-10 DIAGNOSIS — IMO0002 Reserved for concepts with insufficient information to code with codable children: Secondary | ICD-10-CM

## 2014-08-10 DIAGNOSIS — Z713 Dietary counseling and surveillance: Secondary | ICD-10-CM | POA: Insufficient documentation

## 2014-08-10 DIAGNOSIS — Z6834 Body mass index (BMI) 34.0-34.9, adult: Secondary | ICD-10-CM | POA: Insufficient documentation

## 2014-08-10 NOTE — Progress Notes (Signed)
  Medical Nutrition Therapy:  Appt start time: 1330 end time:  1430.   Assessment:  Primary concerns today: Diabetes. He is here with his wife. AKA on left leg. Most recent A1C was 6%. Most foods are baked. His wife does cooking and shopping. He is a Theme park manager of a church. Working on physical therapy exercises at home. Previous education from [facility]. Test blood sugars 4 times per day. Fasting 130's and pre-meal 140-150's mg/dl. Takes 26 units of Levemir and 10 units of Novolog with meals.  Preferred Learning Style:    No preference indicated   Learning Readiness:    Ready  Change in progress  MEDICATIONS: See list   DIETARY INTAKE:  24-hr recall:  B ( AM): 1- 1/2 c oatmeal (?), 2 slices bacon, 1/2 c mandarian oranges, water  Snk ( AM): none  L ( PM): [place] bologna sandwich, lettuce, tomato on white bread, apple, water Snk ( PM): none D ( PM): Baked fish, coleslaw, sweet potato, crystal light  Snk ( PM): none Beverages: water or crystal light.  Usual physical activity: ADL's and Physical Therapy right now.  Estimated energy needs: 1800-2000 calories 200 g carbohydrates 150 g protein 56 g fat  Progress Towards Goal(s):  In progress.   Nutritional Diagnosis:  NB-1.1 Food and nutrition-related knowledge deficit As related to diabetes.  As evidenced by A1C of 6%..    Intervention:  Nutrition  Counseling and diabetes education provided. Plan:  Aim for 3-4 Carb Choices per meal (45-60 grams) +/- 1 either way  Aim for 0-15 Carbs with protein per snack if hungry  Include protein in moderation with your meals and snacks Consider reading food labels for Total Carbohydrate and Fat Grams of foods Consider  increasing your activity level by 15 for 30 minutes daily as tolerated Consider checking BG at alternate times per day as directed by MD  Continue taking insulin medications  as directed by MD  Teaching Method Utilized:  Visual Auditory Hands  on  Handouts given during visit include: My Plate Carb Counting and Food Label handouts Meal Plan Card  Barriers to learning/adherence to lifestyle change: none  Demonstrated degree of understanding via:  Teach Back   Monitoring/Evaluation:  Dietary intake, exercise, meal planning, food journal, and body weight in 1 month(s).

## 2014-09-10 ENCOUNTER — Ambulatory Visit: Payer: BC Managed Care – PPO | Admitting: Nutrition

## 2014-09-11 ENCOUNTER — Encounter: Payer: BC Managed Care – PPO | Attending: "Endocrinology | Admitting: Nutrition

## 2014-09-11 ENCOUNTER — Encounter: Payer: Self-pay | Admitting: Nutrition

## 2014-09-11 VITALS — Ht 72.0 in | Wt 254.0 lb

## 2014-09-11 DIAGNOSIS — E1129 Type 2 diabetes mellitus with other diabetic kidney complication: Secondary | ICD-10-CM | POA: Insufficient documentation

## 2014-09-11 DIAGNOSIS — Z713 Dietary counseling and surveillance: Secondary | ICD-10-CM | POA: Diagnosis not present

## 2014-09-11 DIAGNOSIS — Z6834 Body mass index (BMI) 34.0-34.9, adult: Secondary | ICD-10-CM | POA: Diagnosis not present

## 2014-09-11 DIAGNOSIS — E1159 Type 2 diabetes mellitus with other circulatory complications: Secondary | ICD-10-CM

## 2014-09-11 NOTE — Progress Notes (Signed)
  Medical Nutrition Therapy:  Appt start time: 1330 end time:  1430.   Assessment:  Primary concerns today: Diabetes.  Follow up.  He is here with his wife. AKA on left leg. Most recent A1C was 6%. Most foods are baked. His wife does cooking and shopping. He is a Theme park manager of a church. Working on physical therapy exercises at home. Previous education from [facility]. Test blood sugars 4 times per day. Fasting 130's and pre-meal 140-150's mg/dl. Takes 26 units of Levemir and 10 units of Novolog with meals.  He feels he is doing well.  FBS range 100-129, before lunch 102-133 and before supper meals 92-167 mg/dl. Bedtime BS 95-137.   BS are improving very well now that he is eating better balanced meals with carbohydrates. Watching portion sizes, better meal planning. Has had a few low blood sugar symptoms and is working on better balanced meals. Gained 2 lbs since last visit.  Hasn't been able to exercise much yet.  Preferred Learning Style:    No preference indicated   Learning Readiness:    Ready  Change in progress  MEDICATIONS: See list   DIETARY INTAKE:  24-hr recall:  B ( AM): 1- c oatmeal , 2 slices [place] bacon, 1/2 c mandarian oranges, water  Snk ( AM): none  L ( PM): [place] sandwich, tomatoes, green beans, and  Peaches, water Snk ( PM): none D ( PM): baked meat, string beans, potatoes, water and 1 slice bread and 1/2 c fruit cup Snk ( PM): none Beverages: water or crystal light.  Usual physical activity: ADL's and Physical Therapy right now.  Estimated energy needs: 1800-2000 calories 200 g carbohydrates 150 g protein 56 g fat  Progress Towards Goal(s):  In progress.   Nutritional Diagnosis:  NB-1.1 Food and nutrition-related knowledge deficit As related to diabetes.  As evidenced by A1C of 6%..    Intervention:  Nutrition  Counseling and diabetes education provided. Plan:  Aim for 3-4 Carb Choices per meal (45-60 grams) +/- 1 either way .     He  thinks he met that goal. Cut out snacks between meals.  Include protein in moderation with your meals and snacks Consider reading food labels for Total Carbohydrate and Fat Grams of foods  Has met that goal. Consider  increasing your activity level by 15 for 30 minutes daily as tolerated  Hasn't met this goal yet. Consider checking BG at alternate times per day as directed by MD  Continue taking insulin medications  as directed by MD  Goal: Lose 1/2 lb per week. 2.Begin chair exercises as tolerated.  Teaching Method Utilized:  Visual Auditory Hands on  Handouts given during visit include: My Plate Carb Counting and Food Label handouts Meal Plan Card  Barriers to learning/adherence to lifestyle change: none  Demonstrated degree of understanding via:  Teach Back   Monitoring/Evaluation:  Dietary intake, exercise, meal planning, food journal, and body weight in 3 month(s).

## 2014-09-23 NOTE — Patient Instructions (Signed)
Plan:  Aim for 3-4 Carb Choices per meal (45-60 grams) +/- 1 either way .     He thinks he met that goal. Cut out snacks between meals.  Include protein in moderation with your meals and snacks Consider reading food labels for Total Carbohydrate and Fat Grams of foods  Has met that goal. Consider  increasing your activity level by 15 for 30 minutes daily as tolerated  Hasn't met this goal yet. Consider checking BG at alternate times per day as directed by MD  Continue taking insulin medications  as directed by MD  Goal: Lose 1/2 lb per week. 2.Begin chair exercises as tolerated.

## 2014-10-13 ENCOUNTER — Other Ambulatory Visit: Payer: Self-pay | Admitting: Family Medicine

## 2014-10-13 MED ORDER — FUROSEMIDE 40 MG PO TABS: 40.0000 mg | ORAL_TABLET | Freq: Every day | ORAL | Status: DC

## 2014-10-13 NOTE — Telephone Encounter (Signed)
done

## 2014-10-16 ENCOUNTER — Other Ambulatory Visit: Payer: Self-pay | Admitting: *Deleted

## 2014-10-16 MED ORDER — ROSUVASTATIN CALCIUM 20 MG PO TABS: 20.0000 mg | ORAL_TABLET | Freq: Every day | ORAL | Status: DC

## 2014-10-16 MED ORDER — CILOSTAZOL 100 MG PO TABS: 100.0000 mg | ORAL_TABLET | Freq: Two times a day (BID) | ORAL | Status: DC

## 2014-10-20 ENCOUNTER — Other Ambulatory Visit: Payer: Self-pay | Admitting: Family Medicine

## 2014-10-20 MED ORDER — ATENOLOL 25 MG PO TABS: 25.0000 mg | ORAL_TABLET | Freq: Every day | ORAL | Status: DC

## 2014-10-20 NOTE — Telephone Encounter (Signed)
done

## 2014-10-26 ENCOUNTER — Telehealth: Payer: Self-pay | Admitting: Family Medicine

## 2014-10-26 MED ORDER — CLOPIDOGREL BISULFATE 75 MG PO TABS: 75.0000 mg | ORAL_TABLET | Freq: Every day | ORAL | Status: DC

## 2014-10-26 NOTE — Telephone Encounter (Signed)
done

## 2014-12-09 ENCOUNTER — Other Ambulatory Visit: Payer: Self-pay

## 2014-12-09 MED ORDER — CILOSTAZOL 100 MG PO TABS: 100.0000 mg | ORAL_TABLET | Freq: Two times a day (BID) | ORAL | Status: DC

## 2014-12-14 ENCOUNTER — Encounter: Payer: Self-pay | Attending: "Endocrinology | Admitting: Nutrition

## 2014-12-14 DIAGNOSIS — Z713 Dietary counseling and surveillance: Secondary | ICD-10-CM | POA: Insufficient documentation

## 2014-12-14 DIAGNOSIS — E1129 Type 2 diabetes mellitus with other diabetic kidney complication: Secondary | ICD-10-CM | POA: Insufficient documentation

## 2014-12-14 DIAGNOSIS — Z6834 Body mass index (BMI) 34.0-34.9, adult: Secondary | ICD-10-CM | POA: Insufficient documentation

## 2014-12-17 ENCOUNTER — Telehealth: Payer: Self-pay | Admitting: Family Medicine

## 2014-12-17 MED ORDER — ROSUVASTATIN CALCIUM 20 MG PO TABS: 20.0000 mg | ORAL_TABLET | Freq: Every day | ORAL | Status: DC

## 2014-12-17 NOTE — Telephone Encounter (Signed)
done

## 2015-01-07 ENCOUNTER — Other Ambulatory Visit: Payer: Self-pay | Admitting: *Deleted

## 2015-01-07 MED ORDER — CILOSTAZOL 100 MG PO TABS: 100.0000 mg | ORAL_TABLET | Freq: Two times a day (BID) | ORAL | Status: DC

## 2015-01-12 ENCOUNTER — Other Ambulatory Visit: Payer: Self-pay

## 2015-01-12 MED ORDER — ATENOLOL 25 MG PO TABS: 25.0000 mg | ORAL_TABLET | Freq: Every day | ORAL | Status: DC

## 2015-01-16 ENCOUNTER — Other Ambulatory Visit: Payer: Self-pay | Admitting: *Deleted

## 2015-01-17 MED ORDER — FUROSEMIDE 40 MG PO TABS: 40.0000 mg | ORAL_TABLET | Freq: Every day | ORAL | Status: DC

## 2015-01-21 ENCOUNTER — Other Ambulatory Visit: Payer: Self-pay | Admitting: *Deleted

## 2015-01-21 MED ORDER — CLOPIDOGREL BISULFATE 75 MG PO TABS: 75.0000 mg | ORAL_TABLET | Freq: Every day | ORAL | Status: DC

## 2015-02-03 ENCOUNTER — Ambulatory Visit: Payer: BC Managed Care – PPO | Admitting: Neurology

## 2015-02-08 ENCOUNTER — Other Ambulatory Visit: Payer: Self-pay | Admitting: *Deleted

## 2015-02-08 MED ORDER — CILOSTAZOL 100 MG PO TABS: 100.0000 mg | ORAL_TABLET | Freq: Two times a day (BID) | ORAL | Status: DC

## 2015-02-09 ENCOUNTER — Other Ambulatory Visit: Payer: Self-pay | Admitting: *Deleted

## 2015-02-09 MED ORDER — ATENOLOL 25 MG PO TABS: 25.0000 mg | ORAL_TABLET | Freq: Every day | ORAL | Status: DC

## 2015-02-17 ENCOUNTER — Other Ambulatory Visit: Payer: Self-pay

## 2015-02-17 MED ORDER — FUROSEMIDE 40 MG PO TABS: 40.0000 mg | ORAL_TABLET | Freq: Every day | ORAL | Status: DC

## 2015-02-17 NOTE — Telephone Encounter (Signed)
Last seen 07/03/14 Dr Sabra Heck  Last lipid 10/27/13

## 2015-02-18 ENCOUNTER — Other Ambulatory Visit: Payer: Self-pay

## 2015-02-18 NOTE — Telephone Encounter (Signed)
Last seen 07/03/14  Dr Sabra Heck

## 2015-02-19 MED ORDER — CLOPIDOGREL BISULFATE 75 MG PO TABS: 75.0000 mg | ORAL_TABLET | Freq: Every day | ORAL | Status: DC

## 2015-02-23 ENCOUNTER — Ambulatory Visit (INDEPENDENT_AMBULATORY_CARE_PROVIDER_SITE_OTHER): Payer: BLUE CROSS/BLUE SHIELD | Admitting: Family Medicine

## 2015-02-23 ENCOUNTER — Encounter: Payer: Self-pay | Admitting: Family Medicine

## 2015-02-23 VITALS — BP 122/79 | HR 72 | Temp 98.1°F | Ht 73.0 in | Wt 256.0 lb

## 2015-02-23 DIAGNOSIS — E1159 Type 2 diabetes mellitus with other circulatory complications: Secondary | ICD-10-CM

## 2015-02-23 DIAGNOSIS — I10 Essential (primary) hypertension: Secondary | ICD-10-CM | POA: Diagnosis not present

## 2015-02-23 DIAGNOSIS — E785 Hyperlipidemia, unspecified: Secondary | ICD-10-CM | POA: Diagnosis not present

## 2015-02-23 DIAGNOSIS — G4733 Obstructive sleep apnea (adult) (pediatric): Secondary | ICD-10-CM | POA: Diagnosis not present

## 2015-02-23 MED ORDER — CLOPIDOGREL BISULFATE 75 MG PO TABS: 75.0000 mg | ORAL_TABLET | Freq: Every day | ORAL | Status: DC

## 2015-02-23 MED ORDER — ROSUVASTATIN CALCIUM 20 MG PO TABS: 20.0000 mg | ORAL_TABLET | Freq: Every day | ORAL | Status: DC

## 2015-02-23 MED ORDER — ATENOLOL 25 MG PO TABS: 25.0000 mg | ORAL_TABLET | Freq: Every day | ORAL | Status: DC

## 2015-02-23 MED ORDER — FUROSEMIDE 40 MG PO TABS: 40.0000 mg | ORAL_TABLET | Freq: Every day | ORAL | Status: DC

## 2015-02-23 MED ORDER — CILOSTAZOL 100 MG PO TABS: 100.0000 mg | ORAL_TABLET | Freq: Two times a day (BID) | ORAL | Status: DC

## 2015-02-23 NOTE — Progress Notes (Deleted)
   Subjective:    Patient ID: ERION GAUNCE, male    DOB: 1951/01/05, 64 y.o.   MRN: [id number]  HPI Chief Complaint  Patient presents with  . Diabetes    Managed by endocrinology  . Hypertension  . Hyperlipidemia  . Medication Refill  . Shoulder Pain    bilateal shoulder pain and decreased range of motion    Patient Active Problem List   Diagnosis Date Noted  . Acute confusional state 08/04/2014  . Type 2 diabetes mellitus with other circulatory complications 99991111  . HLD (hyperlipidemia) 08/04/2014  . CKD (chronic kidney disease) 06/28/2014  . Transient global amnesia 06/27/2014  . TIA (transient ischemic attack) 06/27/2014  . Hypotension 11/08/2013  . OBESITY, UNSPECIFIED 08/17/2010  . SYNCOPE 08/17/2010  . CARDIOVASCULAR FUNCTION STUDY, ABNORMAL 08/17/2010  . DM 08/11/2010  . HYPERLIPIDEMIA 08/11/2010  . Essential hypertension 08/11/2010   Outpatient Encounter Prescriptions as of 02/23/2015  Medication Sig  . acetaminophen (TYLENOL) 500 MG tablet Take 500-1,500 mg by mouth every 6 (six) hours as needed for mild pain or moderate pain.  Marland Kitchen atenolol (TENORMIN) 25 MG tablet Take 1 tablet (25 mg total) by mouth daily.  Marland Kitchen BYETTA 10 MCG PEN 10 MCG/0.04ML SOPN injection Inject 10 mcg into the skin 2 (two) times daily.  . Calcium Carb-Cholecalciferol (CALCIUM PLUS VITAMIN D3) 600-500 MG-UNIT CAPS Take 1 capsule by mouth daily.   . cilostazol (PLETAL) 100 MG tablet Take 1 tablet (100 mg total) by mouth 2 (two) times daily.  . clopidogrel (PLAVIX) 75 MG tablet Take 1 tablet (75 mg total) by mouth daily.  . furosemide (LASIX) 40 MG tablet Take 1 tablet (40 mg total) by mouth daily.  . insulin aspart (NOVOLOG) 100 UNIT/ML injection Inject 10 Units into the skin 3 (three) times daily before meals.   Marland Kitchen LANTUS SOLOSTAR 100 UNIT/ML Solostar Pen Inject 25 Units into the skin at bedtime.  . rosuvastatin (CRESTOR) 20 MG tablet Take 1 tablet (20 mg total) by mouth daily.  . [DISCONTINUED]  exenatide (BYETTA) 5 MCG/0.02ML SOPN injection Inject 10 mcg into the skin 2 (two) times daily with a meal.  . [DISCONTINUED] insulin detemir (LEVEMIR) 100 UNIT/ML injection Inject 26 Units into the skin at bedtime.      Review of Systems     Objective:   Physical Exam BP 122/79 mmHg  Pulse 72  Temp(Src) 98.1 F (36.7 C) (Oral)  Ht 6\' 1"  (1.854 m)  Wt 256 lb (116.121 kg)  BMI 33.78 kg/m2        Assessment & Plan:

## 2015-02-23 NOTE — Addendum Note (Signed)
Addended by: Ilean China on: 02/23/2015 04:14 PM   Modules accepted: Orders

## 2015-02-23 NOTE — Progress Notes (Signed)
Subjective:    Patient ID: [name], male    DOB: Aug 05, 1951, 64 y.o.   MRN: [id number]  HPI 64 year old male here to follow-up hypertension and hyperlipidemia. He takes atenolol for blood pressure but not on an Ace or nor apparently followed by nephrologist as well as diabetologist and I'm assuming that those medicines have been discontinued.  He does complain of some right shoulder pain. There is no history of injury or trauma. Pain increases as he raises his arm above shoulder level.  Chief Complaint  Patient presents with  . Diabetes    Managed by endocrinology  . Hypertension  . Hyperlipidemia  . Medication Refill  . Shoulder Pain    bilateral shoulder pain and decreased range of motion    Patient Active Problem List   Diagnosis Date Noted  . Acute confusional state 08/04/2014  . Type 2 diabetes mellitus with other circulatory complications 31/49/7026  . HLD (hyperlipidemia) 08/04/2014  . CKD (chronic kidney disease) 06/28/2014  . Transient global amnesia 06/27/2014  . TIA (transient ischemic attack) 06/27/2014  . Hypotension 11/08/2013  . OBESITY, UNSPECIFIED 08/17/2010  . SYNCOPE 08/17/2010  . CARDIOVASCULAR FUNCTION STUDY, ABNORMAL 08/17/2010  . DM 08/11/2010  . HYPERLIPIDEMIA 08/11/2010  . Essential hypertension 08/11/2010   Outpatient Encounter Prescriptions as of 02/23/2015  Medication Sig  . acetaminophen (TYLENOL) 500 MG tablet Take 500-1,500 mg by mouth every 6 (six) hours as needed for mild pain or moderate pain.  Marland Kitchen atenolol (TENORMIN) 25 MG tablet Take 1 tablet (25 mg total) by mouth daily.  Marland Kitchen BYETTA 10 MCG PEN 10 MCG/0.04ML SOPN injection Inject 10 mcg into the skin 2 (two) times daily.  . Calcium Carb-Cholecalciferol (CALCIUM PLUS VITAMIN D3) 600-500 MG-UNIT CAPS Take 1 capsule by mouth daily.   . cilostazol (PLETAL) 100 MG tablet Take 1 tablet (100 mg total) by mouth 2 (two) times daily.  . clopidogrel (PLAVIX) 75 MG tablet Take 1 tablet (75 mg total)  by mouth daily.  . furosemide (LASIX) 40 MG tablet Take 1 tablet (40 mg total) by mouth daily.  . insulin aspart (NOVOLOG) 100 UNIT/ML injection Inject 10 Units into the skin 3 (three) times daily before meals.   Marland Kitchen LANTUS SOLOSTAR 100 UNIT/ML Solostar Pen Inject 25 Units into the skin at bedtime.  . rosuvastatin (CRESTOR) 20 MG tablet Take 1 tablet (20 mg total) by mouth daily.  . [DISCONTINUED] exenatide (BYETTA) 5 MCG/0.02ML SOPN injection Inject 10 mcg into the skin 2 (two) times daily with a meal.  . [DISCONTINUED] insulin detemir (LEVEMIR) 100 UNIT/ML injection Inject 26 Units into the skin at bedtime.      Review of Systems  Respiratory: Negative.   Cardiovascular: Negative.   Gastrointestinal: Negative.   Musculoskeletal: Positive for arthralgias.  Neurological: Positive for weakness.  Psychiatric/Behavioral: Negative.        Objective:   Physical Exam  Constitutional: He appears well-developed and well-nourished.  Cardiovascular: Normal rate and regular rhythm.   Pulmonary/Chest: Effort normal and breath sounds normal.  Musculoskeletal:  Right shoulder: Decreased abduction. Tender anteriorly and bicipital tendon origin area. Normal internal rotation strength    BP 122/79 mmHg  Pulse 72  Temp(Src) 98.1 F (36.7 C) (Oral)  Ht _0  (1.854 m)  Wt 256 lb (116.121 kg)  BMI 33.78 kg/m2       Assessment & Plan:  1. Type 2 diabetes mellitus with other circulatory complications Followed by endocrinologist. A1c's are in the 5-6 range  2. Essential hypertension  Blood pressure well controlled on atenolol even though it's probably not a true 24 hour medication  3. HLD (hyperlipidemia) Continues with Crestor without side effects - CMP14+EGFR - Lipid panel  4. OSA (obstructive sleep apnea) We are trying to arrange a home sleep study and possible new CPAP machine - Home sleep test; Future  Wardell Honour MD

## 2015-02-24 LAB — CMP14+EGFR
ALT: 23 IU/L (ref 0–44)
AST: 19 IU/L (ref 0–40)
Albumin/Globulin Ratio: 1.6 (ref 1.1–2.5)
Albumin: 4.1 g/dL (ref 3.6–4.8)
Alkaline Phosphatase: 77 IU/L (ref 39–117)
BILIRUBIN TOTAL: 0.6 mg/dL (ref 0.0–1.2)
BUN/Creatinine Ratio: 17 (ref 10–22)
BUN: 32 mg/dL — AB (ref 8–27)
CALCIUM: 9.2 mg/dL (ref 8.6–10.2)
CO2: 23 mmol/L (ref 18–29)
CREATININE: 1.88 mg/dL — AB (ref 0.76–1.27)
Chloride: 104 mmol/L (ref 97–108)
GFR calc Af Amer: 43 mL/min/{1.73_m2} — ABNORMAL LOW (ref >59)
GFR calc non Af Amer: 37 mL/min/{1.73_m2} — ABNORMAL LOW (ref >59)
GLOBULIN, TOTAL: 2.6 g/dL (ref 1.5–4.5)
GLUCOSE: 85 mg/dL (ref 65–99)
Potassium: 4.4 mmol/L (ref 3.5–5.2)
SODIUM: 143 mmol/L (ref 134–144)
TOTAL PROTEIN: 6.7 g/dL (ref 6.0–8.5)

## 2015-02-24 LAB — LIPID PANEL
Chol/HDL Ratio: 2.9 ratio units (ref 0.0–5.0)
Cholesterol, Total: 158 mg/dL (ref 100–199)
HDL: 55 mg/dL (ref >39)
LDL CALC: 86 mg/dL (ref 0–99)
Triglycerides: 83 mg/dL (ref 0–149)
VLDL Cholesterol Cal: 17 mg/dL (ref 5–40)

## 2015-02-25 ENCOUNTER — Encounter: Payer: Self-pay | Admitting: *Deleted

## 2015-02-25 NOTE — Progress Notes (Signed)
Home sleep study order, demos, ov notes and ins. Faxed to [facility]

## 2015-03-02 ENCOUNTER — Ambulatory Visit: Payer: Self-pay | Admitting: Family Medicine

## 2015-03-30 ENCOUNTER — Telehealth: Payer: Self-pay | Admitting: Family Medicine

## 2015-03-31 ENCOUNTER — Encounter: Payer: Self-pay | Admitting: *Deleted

## 2015-03-31 DIAGNOSIS — Z89519 Acquired absence of unspecified leg below knee: Secondary | ICD-10-CM | POA: Insufficient documentation

## 2015-03-31 NOTE — Telephone Encounter (Signed)
Rx sent to [place], [facility] [address] for pt

## 2015-04-11 ENCOUNTER — Encounter (HOSPITAL_COMMUNITY): Payer: Self-pay | Admitting: *Deleted

## 2015-04-11 ENCOUNTER — Inpatient Hospital Stay (HOSPITAL_COMMUNITY)
Admission: EM | Admit: 2015-04-11 | Discharge: 2015-04-15 | DRG: 638 | Disposition: A | Discharge status: Home Health | Payer: BLUE CROSS/BLUE SHIELD | Attending: Internal Medicine | Admitting: Internal Medicine

## 2015-04-11 ENCOUNTER — Emergency Department (HOSPITAL_COMMUNITY): Payer: BLUE CROSS/BLUE SHIELD

## 2015-04-11 DIAGNOSIS — G4733 Obstructive sleep apnea (adult) (pediatric): Secondary | ICD-10-CM | POA: Diagnosis present

## 2015-04-11 DIAGNOSIS — N189 Chronic kidney disease, unspecified: Secondary | ICD-10-CM | POA: Diagnosis present

## 2015-04-11 DIAGNOSIS — L8990 Pressure ulcer of unspecified site, unspecified stage: Secondary | ICD-10-CM | POA: Insufficient documentation

## 2015-04-11 DIAGNOSIS — E11621 Type 2 diabetes mellitus with foot ulcer: Secondary | ICD-10-CM | POA: Diagnosis present

## 2015-04-11 DIAGNOSIS — E1169 Type 2 diabetes mellitus with other specified complication: Secondary | ICD-10-CM | POA: Diagnosis not present

## 2015-04-11 DIAGNOSIS — Z89519 Acquired absence of unspecified leg below knee: Secondary | ICD-10-CM | POA: Diagnosis present

## 2015-04-11 DIAGNOSIS — I129 Hypertensive chronic kidney disease with stage 1 through stage 4 chronic kidney disease, or unspecified chronic kidney disease: Secondary | ICD-10-CM | POA: Diagnosis present

## 2015-04-11 DIAGNOSIS — Z8249 Family history of ischemic heart disease and other diseases of the circulatory system: Secondary | ICD-10-CM

## 2015-04-11 DIAGNOSIS — L03031 Cellulitis of right toe: Secondary | ICD-10-CM | POA: Diagnosis present

## 2015-04-11 DIAGNOSIS — Z7902 Long term (current) use of antithrombotics/antiplatelets: Secondary | ICD-10-CM

## 2015-04-11 DIAGNOSIS — I739 Peripheral vascular disease, unspecified: Secondary | ICD-10-CM | POA: Diagnosis present

## 2015-04-11 DIAGNOSIS — Z809 Family history of malignant neoplasm, unspecified: Secondary | ICD-10-CM

## 2015-04-11 DIAGNOSIS — I2510 Atherosclerotic heart disease of native coronary artery without angina pectoris: Secondary | ICD-10-CM | POA: Diagnosis present

## 2015-04-11 DIAGNOSIS — M869 Osteomyelitis, unspecified: Secondary | ICD-10-CM | POA: Diagnosis present

## 2015-04-11 DIAGNOSIS — N182 Chronic kidney disease, stage 2 (mild): Secondary | ICD-10-CM | POA: Diagnosis present

## 2015-04-11 DIAGNOSIS — E11628 Type 2 diabetes mellitus with other skin complications: Secondary | ICD-10-CM

## 2015-04-11 DIAGNOSIS — E785 Hyperlipidemia, unspecified: Secondary | ICD-10-CM | POA: Diagnosis present

## 2015-04-11 DIAGNOSIS — L97519 Non-pressure chronic ulcer of other part of right foot with unspecified severity: Secondary | ICD-10-CM | POA: Diagnosis present

## 2015-04-11 DIAGNOSIS — I10 Essential (primary) hypertension: Secondary | ICD-10-CM | POA: Diagnosis present

## 2015-04-11 DIAGNOSIS — M7989 Other specified soft tissue disorders: Secondary | ICD-10-CM | POA: Diagnosis not present

## 2015-04-11 DIAGNOSIS — Z833 Family history of diabetes mellitus: Secondary | ICD-10-CM

## 2015-04-11 DIAGNOSIS — Z794 Long term (current) use of insulin: Secondary | ICD-10-CM

## 2015-04-11 DIAGNOSIS — L03115 Cellulitis of right lower limb: Secondary | ICD-10-CM | POA: Diagnosis present

## 2015-04-11 DIAGNOSIS — E11319 Type 2 diabetes mellitus with unspecified diabetic retinopathy without macular edema: Secondary | ICD-10-CM | POA: Diagnosis present

## 2015-04-11 DIAGNOSIS — E1159 Type 2 diabetes mellitus with other circulatory complications: Secondary | ICD-10-CM

## 2015-04-11 DIAGNOSIS — L089 Local infection of the skin and subcutaneous tissue, unspecified: Secondary | ICD-10-CM | POA: Diagnosis present

## 2015-04-11 DIAGNOSIS — Z89612 Acquired absence of left leg above knee: Secondary | ICD-10-CM

## 2015-04-11 DIAGNOSIS — E1152 Type 2 diabetes mellitus with diabetic peripheral angiopathy with gangrene: Secondary | ICD-10-CM

## 2015-04-11 DIAGNOSIS — L0390 Cellulitis, unspecified: Secondary | ICD-10-CM

## 2015-04-11 DIAGNOSIS — I5032 Chronic diastolic (congestive) heart failure: Secondary | ICD-10-CM | POA: Diagnosis present

## 2015-04-11 DIAGNOSIS — E669 Obesity, unspecified: Secondary | ICD-10-CM | POA: Diagnosis present

## 2015-04-11 DIAGNOSIS — N184 Chronic kidney disease, stage 4 (severe): Secondary | ICD-10-CM | POA: Diagnosis present

## 2015-04-11 MED ORDER — ACETAMINOPHEN 500 MG PO TABS
1000.0000 mg | ORAL_TABLET | Freq: Once | ORAL | Status: AC
  Administered 2015-04-11: 1000 mg via ORAL
  Filled 2015-04-11: qty 2

## 2015-04-11 NOTE — ED Provider Notes (Signed)
CSN: [id number]     Arrival date & time 04/11/15  2213 History  This chart was scribed for Merryl Hacker, MD by Chester Holstein, ED Scribe. This patient was seen in room APA03/APA03 and the patient's care was started at 11:25 PM.    Chief Complaint  Patient presents with  . Toe Injury      The history is provided by the patient. No language interpreter was used.   HPI Comments: [name] is a 64 y.o. male with PMHx of HTN, HLD, CKD, left AKA, necrosis, and DM who presents to the Emergency Department complaining of right pinky toe pain with onset 2 weeks ago. Pt notes associated fever, swelling, and redness. Pt denies known injury. Pt denies chills, chest pain, SOB, and abdominal pain.   AKA on the left from a diabetic wound infection.    Past Medical History  Diagnosis Date  . Hypertension   . Hyperlipidemia   . Type 2 Diabetes mellitus   . Necrosis     #2 nail   . CKD (chronic kidney disease) 06/28/2014   Past Surgical History  Procedure Laterality Date  . Above knee leg amputation  2004  . Cataract extraction w/phaco  09/05/2012    Procedure: CATARACT EXTRACTION PHACO AND INTRAOCULAR LENS PLACEMENT (IOC);  Surgeon: Tonny Branch, MD;  Location: [facility];  Service: Ophthalmology;  Laterality: Left;  CDE=5.45  . Cataract extraction w/phaco  10/03/2012    Procedure: CATARACT EXTRACTION PHACO AND INTRAOCULAR LENS PLACEMENT (IOC);  Surgeon: Tonny Branch, MD;  Location: [facility];  Service: Ophthalmology;  Laterality: Right;  CDE: 12.31   Family History  Problem Relation Age of Onset  . Diabetes Mother   . Hypertension Mother   . Cancer Father   . Cancer Sister   . Sickle cell anemia Daughter    History  Substance Use Topics  . Smoking status: Never Smoker   . Smokeless tobacco: Never Used  . Alcohol Use: No    Review of Systems  Constitutional: Positive for fever.  Respiratory: Negative.  Negative for chest tightness and shortness of breath.   Cardiovascular: Negative.   Negative for chest pain.  Gastrointestinal: Negative.  Negative for abdominal pain.  Genitourinary: Negative.  Negative for dysuria.  Musculoskeletal:       Left fifth toe pain  Skin: Positive for color change and wound.  Neurological: Negative for headaches.  All other systems reviewed and are negative.     Allergies  Zolpidem tartrate  Home Medications   Prior to Admission medications   Medication Sig Start Date End Date Taking? Authorizing Provider  acetaminophen (TYLENOL) 500 MG tablet Take 500-1,500 mg by mouth every 6 (six) hours as needed for mild pain or moderate pain.    Historical Provider, MD  atenolol (TENORMIN) 25 MG tablet Take 1 tablet (25 mg total) by mouth daily. 02/23/15   Wardell Honour, MD  BYETTA 10 MCG PEN 10 MCG/0.04ML SOPN injection Inject 10 mcg into the skin 2 (two) times daily. 01/13/15   Historical Provider, MD  Calcium Carb-Cholecalciferol (CALCIUM PLUS VITAMIN D3) 600-500 MG-UNIT CAPS Take 1 capsule by mouth daily.     Historical Provider, MD  cilostazol (PLETAL) 100 MG tablet Take 1 tablet (100 mg total) by mouth 2 (two) times daily. 02/23/15   Wardell Honour, MD  clopidogrel (PLAVIX) 75 MG tablet Take 1 tablet (75 mg total) by mouth daily. 02/23/15   Wardell Honour, MD  furosemide (LASIX) 40 MG tablet  Take 1 tablet (40 mg total) by mouth daily. 02/23/15   Wardell Honour, MD  insulin aspart (NOVOLOG) 100 UNIT/ML injection Inject 10 Units into the skin 3 (three) times daily before meals.     Historical Provider, MD  LANTUS SOLOSTAR 100 UNIT/ML Solostar Pen Inject 25 Units into the skin at bedtime. 01/09/15   Historical Provider, MD  rosuvastatin (CRESTOR) 20 MG tablet Take 1 tablet (20 mg total) by mouth daily. 02/23/15   Wardell Honour, MD   BP 110/63 mmHg  Pulse 87  Temp(Src) 100.8 F (38.2 C) (Oral)  Resp 20  Ht 6' (1.829 m)  Wt 260 lb (117.935 kg)  BMI 35.25 kg/m2  SpO2 98% Physical Exam  Constitutional: He is oriented to person, place, and  time. He appears well-developed and well-nourished. No distress.  HENT:  Head: Normocephalic and atraumatic.  Cardiovascular: Normal rate, regular rhythm and normal heart sounds.   No murmur heard. Pulmonary/Chest: Effort normal and breath sounds normal. No respiratory distress. He has no wheezes.  Abdominal: Soft. Bowel sounds are normal. There is no tenderness. There is no rebound.  Musculoskeletal: He exhibits no edema.  Left AKA Focused examination of the right foot reveals swelling of the foot mostly over the lateral aspect of the foot, there is open wound over the left fifth digit with darkening discoloration, no crepitus, errythema and warmth noted, faint 1+ DP pulse  Neurological: He is alert and oriented to person, place, and time.  Skin: Skin is warm and dry.  Psychiatric: He has a normal mood and affect.  Nursing note and vitals reviewed.   ED Course  Procedures (including critical care time) DIAGNOSTIC STUDIES: Oxygen Saturation is 98% on room air, normal by my interpretation.    COORDINATION OF CARE: 2:08 AM Discussed treatment plan with patient at beside, the patient agrees with the plan and has no further questions at this time.   Labs Review Labs Reviewed  COMPREHENSIVE METABOLIC PANEL - Abnormal; Notable for the following:    Sodium 133 (*)    Chloride 100 (*)    Glucose, Bld 194 (*)    BUN 36 (*)    Creatinine, Ser 2.19 (*)    Calcium 8.3 (*)    Albumin 2.6 (*)    ALT 14 (*)    GFR calc non Af Amer 30 (*)    GFR calc Af Amer 35 (*)    All other components within normal limits  CBC WITH DIFFERENTIAL/PLATELET - Abnormal; Notable for the following:    WBC 13.6 (*)    RBC 3.77 (*)    Hemoglobin 11.4 (*)    HCT 33.2 (*)    Neutro Abs 10.2 (*)    Monocytes Absolute 1.3 (*)    All other components within normal limits  CULTURE, BLOOD (ROUTINE X 2)  CULTURE, BLOOD (ROUTINE X 2)  URINE CULTURE  URINALYSIS, ROUTINE W REFLEX MICROSCOPIC (NOT AT [facility])  I-STAT  CG4 LACTIC ACID, ED  I-STAT CG4 LACTIC ACID, ED    Imaging Review Dg Foot Complete Right  04/12/2015   CLINICAL DATA:  Right fifth toe pain, with fever, swelling and erythema for 2 weeks. Initial encounter.  EXAM: RIGHT FOOT COMPLETE - 3+ VIEW  COMPARISON:  None.  FINDINGS: There is vague lucency involving the distal aspect of the fifth proximal phalanx, raising question for underlying osteomyelitis, though this is only seen on a single image. Surrounding soft tissue swelling is noted at the fifth toe.  The joint  spaces are preserved. There is no evidence of talar subluxation; the subtalar joint is unremarkable in appearance. Plantar and posterior calcaneal spurs are seen. A prominent os peroneum is noted. There is a bipartite medial sesamoid of the first toe.  Diffuse vascular calcifications are seen.  IMPRESSION: 1. Vague lucency involving the distal aspect of the fifth proximal phalanx, raising question for underlying osteomyelitis, though this is only seen on a single image. MRI could be considered for further evaluation, as deemed clinically appropriate. 2. Diffuse vascular calcifications seen. 3. Prominent os peroneum noted. 4. Bipartite medial sesamoid of the first toe.   Electronically Signed   By: Garald Balding M.D.   On: 04/12/2015 00:42     EKG Interpretation None      MDM   Final diagnoses:  Diabetic foot infection   Patient presents with pain and wound to right fifth digit. Noted to be febrile to 100.8. Otherwise nontoxic. Wound is concerning for diabetic foot infection versus cellulitis versus osteomyelitis. There is some darkening of the skin that would suggest possible early gangrene as well. No crepitus.  Septic workup initiated. Lactate normal. Patient does have leukocytosis to 13. X-ray of the foot shows questionable underlying osteomyelitis. Recommend MRI. Patient given vancomycin and Zosyn.  Patient given fluids. Chest x-ray and urinalysis pending for completeness of sepsis  workup; however, suspect fever is from infected toe. Will admit to medicine.  I personally performed the services described in this documentation, which was scribed in my presence. The recorded information has been reviewed and is accurate.     Merryl Hacker, MD 04/12/15 [phone]

## 2015-04-11 NOTE — ED Notes (Addendum)
Pt states he has had a place come up on his pinky toe on his right foot. Pt states it has been there a little over 2 weeks. Pt also c/o pain in his lower right leg. Pt's foot is swollen and red. Pt is diabetic.

## 2015-04-12 ENCOUNTER — Inpatient Hospital Stay (HOSPITAL_COMMUNITY): Payer: BLUE CROSS/BLUE SHIELD

## 2015-04-12 ENCOUNTER — Encounter (HOSPITAL_COMMUNITY): Payer: Self-pay | Admitting: *Deleted

## 2015-04-12 DIAGNOSIS — L089 Local infection of the skin and subcutaneous tissue, unspecified: Secondary | ICD-10-CM

## 2015-04-12 DIAGNOSIS — N189 Chronic kidney disease, unspecified: Secondary | ICD-10-CM

## 2015-04-12 DIAGNOSIS — E11628 Type 2 diabetes mellitus with other skin complications: Secondary | ICD-10-CM | POA: Diagnosis present

## 2015-04-12 DIAGNOSIS — Z8249 Family history of ischemic heart disease and other diseases of the circulatory system: Secondary | ICD-10-CM | POA: Diagnosis not present

## 2015-04-12 DIAGNOSIS — L8990 Pressure ulcer of unspecified site, unspecified stage: Secondary | ICD-10-CM | POA: Insufficient documentation

## 2015-04-12 DIAGNOSIS — E1169 Type 2 diabetes mellitus with other specified complication: Secondary | ICD-10-CM | POA: Diagnosis not present

## 2015-04-12 DIAGNOSIS — I129 Hypertensive chronic kidney disease with stage 1 through stage 4 chronic kidney disease, or unspecified chronic kidney disease: Secondary | ICD-10-CM | POA: Diagnosis present

## 2015-04-12 DIAGNOSIS — Z89512 Acquired absence of left leg below knee: Secondary | ICD-10-CM

## 2015-04-12 DIAGNOSIS — Z809 Family history of malignant neoplasm, unspecified: Secondary | ICD-10-CM | POA: Diagnosis not present

## 2015-04-12 DIAGNOSIS — M7989 Other specified soft tissue disorders: Secondary | ICD-10-CM | POA: Diagnosis present

## 2015-04-12 DIAGNOSIS — Z833 Family history of diabetes mellitus: Secondary | ICD-10-CM | POA: Diagnosis not present

## 2015-04-12 DIAGNOSIS — E11621 Type 2 diabetes mellitus with foot ulcer: Secondary | ICD-10-CM | POA: Diagnosis present

## 2015-04-12 DIAGNOSIS — I739 Peripheral vascular disease, unspecified: Secondary | ICD-10-CM | POA: Diagnosis present

## 2015-04-12 DIAGNOSIS — E669 Obesity, unspecified: Secondary | ICD-10-CM | POA: Diagnosis present

## 2015-04-12 DIAGNOSIS — M869 Osteomyelitis, unspecified: Secondary | ICD-10-CM | POA: Diagnosis present

## 2015-04-12 DIAGNOSIS — E1159 Type 2 diabetes mellitus with other circulatory complications: Secondary | ICD-10-CM | POA: Diagnosis not present

## 2015-04-12 DIAGNOSIS — N184 Chronic kidney disease, stage 4 (severe): Secondary | ICD-10-CM | POA: Diagnosis present

## 2015-04-12 DIAGNOSIS — L03031 Cellulitis of right toe: Secondary | ICD-10-CM | POA: Diagnosis present

## 2015-04-12 DIAGNOSIS — E11319 Type 2 diabetes mellitus with unspecified diabetic retinopathy without macular edema: Secondary | ICD-10-CM | POA: Diagnosis present

## 2015-04-12 DIAGNOSIS — G4733 Obstructive sleep apnea (adult) (pediatric): Secondary | ICD-10-CM

## 2015-04-12 DIAGNOSIS — Z794 Long term (current) use of insulin: Secondary | ICD-10-CM | POA: Diagnosis not present

## 2015-04-12 DIAGNOSIS — L03115 Cellulitis of right lower limb: Secondary | ICD-10-CM | POA: Diagnosis present

## 2015-04-12 DIAGNOSIS — I2510 Atherosclerotic heart disease of native coronary artery without angina pectoris: Secondary | ICD-10-CM | POA: Diagnosis present

## 2015-04-12 DIAGNOSIS — I5032 Chronic diastolic (congestive) heart failure: Secondary | ICD-10-CM | POA: Diagnosis present

## 2015-04-12 DIAGNOSIS — E785 Hyperlipidemia, unspecified: Secondary | ICD-10-CM | POA: Diagnosis present

## 2015-04-12 DIAGNOSIS — Z7902 Long term (current) use of antithrombotics/antiplatelets: Secondary | ICD-10-CM | POA: Diagnosis not present

## 2015-04-12 DIAGNOSIS — I10 Essential (primary) hypertension: Secondary | ICD-10-CM | POA: Diagnosis not present

## 2015-04-12 DIAGNOSIS — Z89612 Acquired absence of left leg above knee: Secondary | ICD-10-CM | POA: Diagnosis not present

## 2015-04-12 DIAGNOSIS — L97519 Non-pressure chronic ulcer of other part of right foot with unspecified severity: Secondary | ICD-10-CM | POA: Diagnosis present

## 2015-04-12 LAB — CBC WITH DIFFERENTIAL/PLATELET
Basophils Absolute: 0 10*3/uL (ref 0.0–0.1)
Basophils Relative: 0 % (ref 0–1)
Eosinophils Absolute: 0.1 10*3/uL (ref 0.0–0.7)
Eosinophils Relative: 1 % (ref 0–5)
HCT: 33.2 % — ABNORMAL LOW (ref 39.0–52.0)
Hemoglobin: 11.4 g/dL — ABNORMAL LOW (ref 13.0–17.0)
Lymphocytes Relative: 14 % (ref 12–46)
Lymphs Abs: 1.9 10*3/uL (ref 0.7–4.0)
MCH: 30.2 pg (ref 26.0–34.0)
MCHC: 34.3 g/dL (ref 30.0–36.0)
MCV: 88.1 fL (ref 78.0–100.0)
MONO ABS: 1.3 10*3/uL — AB (ref 0.1–1.0)
Monocytes Relative: 10 % (ref 3–12)
NEUTROS PCT: 75 % (ref 43–77)
Neutro Abs: 10.2 10*3/uL — ABNORMAL HIGH (ref 1.7–7.7)
Platelets: 250 10*3/uL (ref 150–400)
RBC: 3.77 MIL/uL — AB (ref 4.22–5.81)
RDW: 13.7 % (ref 11.5–15.5)
WBC: 13.6 10*3/uL — ABNORMAL HIGH (ref 4.0–10.5)

## 2015-04-12 LAB — URINALYSIS, ROUTINE W REFLEX MICROSCOPIC
Bilirubin Urine: NEGATIVE
Glucose, UA: NEGATIVE mg/dL
Ketones, ur: NEGATIVE mg/dL
LEUKOCYTES UA: NEGATIVE
Nitrite: NEGATIVE
PROTEIN: NEGATIVE mg/dL
SPECIFIC GRAVITY, URINE: 1.01 (ref 1.005–1.030)
UROBILINOGEN UA: 0.2 mg/dL (ref 0.0–1.0)
pH: 5.5 (ref 5.0–8.0)

## 2015-04-12 LAB — COMPREHENSIVE METABOLIC PANEL
ALT: 14 U/L — ABNORMAL LOW (ref 17–63)
AST: 17 U/L (ref 15–41)
Albumin: 2.6 g/dL — ABNORMAL LOW (ref 3.5–5.0)
Alkaline Phosphatase: 65 U/L (ref 38–126)
Anion gap: 10 (ref 5–15)
BILIRUBIN TOTAL: 0.9 mg/dL (ref 0.3–1.2)
BUN: 36 mg/dL — ABNORMAL HIGH (ref 6–20)
CHLORIDE: 100 mmol/L — AB (ref 101–111)
CO2: 23 mmol/L (ref 22–32)
CREATININE: 2.19 mg/dL — AB (ref 0.61–1.24)
Calcium: 8.3 mg/dL — ABNORMAL LOW (ref 8.9–10.3)
GFR calc Af Amer: 35 mL/min — ABNORMAL LOW (ref >60)
GFR calc non Af Amer: 30 mL/min — ABNORMAL LOW (ref >60)
Glucose, Bld: 194 mg/dL — ABNORMAL HIGH (ref 65–99)
Potassium: 4 mmol/L (ref 3.5–5.1)
Sodium: 133 mmol/L — ABNORMAL LOW (ref 135–145)
Total Protein: 6.7 g/dL (ref 6.5–8.1)

## 2015-04-12 LAB — GLUCOSE, CAPILLARY
GLUCOSE-CAPILLARY: 166 mg/dL — AB (ref 65–99)
GLUCOSE-CAPILLARY: 215 mg/dL — AB (ref 65–99)
GLUCOSE-CAPILLARY: 198 mg/dL — AB (ref 65–99)
Glucose-Capillary: 184 mg/dL — ABNORMAL HIGH (ref 65–99)

## 2015-04-12 LAB — I-STAT CG4 LACTIC ACID, ED: LACTIC ACID, VENOUS: 0.6 mmol/L (ref 0.5–2.0)

## 2015-04-12 LAB — URINE MICROSCOPIC-ADD ON

## 2015-04-12 MED ORDER — ROSUVASTATIN CALCIUM 20 MG PO TABS
20.0000 mg | ORAL_TABLET | Freq: Every day | ORAL | Status: DC
  Administered 2015-04-12 – 2015-04-15 (×4): 20 mg via ORAL
  Filled 2015-04-12 (×4): qty 1

## 2015-04-12 MED ORDER — VANCOMYCIN HCL IN DEXTROSE 1-5 GM/200ML-% IV SOLN
1000.0000 mg | Freq: Once | INTRAVENOUS | Status: AC
  Administered 2015-04-12: 1000 mg via INTRAVENOUS
  Filled 2015-04-12: qty 200

## 2015-04-12 MED ORDER — PIPERACILLIN-TAZOBACTAM 3.375 G IVPB 30 MIN
3.3750 g | Freq: Once | INTRAVENOUS | Status: AC
  Administered 2015-04-12: 3.375 g via INTRAVENOUS
  Filled 2015-04-12: qty 50

## 2015-04-12 MED ORDER — ATENOLOL 25 MG PO TABS
25.0000 mg | ORAL_TABLET | Freq: Every day | ORAL | Status: DC
  Administered 2015-04-12 – 2015-04-15 (×4): 25 mg via ORAL
  Filled 2015-04-12 (×4): qty 1

## 2015-04-12 MED ORDER — PIPERACILLIN-TAZOBACTAM 3.375 G IVPB
3.3750 g | Freq: Once | INTRAVENOUS | Status: DC
  Filled 2015-04-12: qty 50

## 2015-04-12 MED ORDER — SODIUM CHLORIDE 0.9 % IJ SOLN
3.0000 mL | Freq: Two times a day (BID) | INTRAMUSCULAR | Status: DC
  Administered 2015-04-12 – 2015-04-15 (×3): 3 mL via INTRAVENOUS

## 2015-04-12 MED ORDER — HEPARIN SODIUM (PORCINE) 5000 UNIT/ML IJ SOLN
5000.0000 [IU] | Freq: Three times a day (TID) | INTRAMUSCULAR | Status: DC
  Administered 2015-04-12 – 2015-04-15 (×9): 5000 [IU] via SUBCUTANEOUS
  Filled 2015-04-12 (×10): qty 1

## 2015-04-12 MED ORDER — ENOXAPARIN SODIUM 40 MG/0.4ML ~~LOC~~ SOLN
40.0000 mg | SUBCUTANEOUS | Status: DC
  Administered 2015-04-12: 40 mg via SUBCUTANEOUS
  Filled 2015-04-12: qty 0.4

## 2015-04-12 MED ORDER — CLOPIDOGREL BISULFATE 75 MG PO TABS
75.0000 mg | ORAL_TABLET | Freq: Every day | ORAL | Status: DC
  Administered 2015-04-12 – 2015-04-15 (×4): 75 mg via ORAL
  Filled 2015-04-12 (×5): qty 1

## 2015-04-12 MED ORDER — CILOSTAZOL 100 MG PO TABS
100.0000 mg | ORAL_TABLET | Freq: Two times a day (BID) | ORAL | Status: DC
  Administered 2015-04-12 – 2015-04-15 (×7): 100 mg via ORAL
  Filled 2015-04-12 (×9): qty 1

## 2015-04-12 MED ORDER — INSULIN GLARGINE 100 UNIT/ML ~~LOC~~ SOLN
25.0000 [IU] | Freq: Every day | SUBCUTANEOUS | Status: DC
  Administered 2015-04-12: 25 [IU] via SUBCUTANEOUS
  Filled 2015-04-12 (×2): qty 0.25

## 2015-04-12 MED ORDER — SODIUM CHLORIDE 0.9 % IJ SOLN
INTRAMUSCULAR | Status: AC
  Filled 2015-04-12: qty 50

## 2015-04-12 MED ORDER — SODIUM CHLORIDE 0.9 % IV SOLN: 250.0000 mL | INTRAVENOUS | Status: DC | PRN

## 2015-04-12 MED ORDER — SODIUM CHLORIDE 0.9 % IV BOLUS (SEPSIS)
1000.0000 mL | Freq: Once | INTRAVENOUS | Status: AC
  Administered 2015-04-12: 1000 mL via INTRAVENOUS

## 2015-04-12 MED ORDER — INSULIN ASPART 100 UNIT/ML ~~LOC~~ SOLN
0.0000 [IU] | Freq: Three times a day (TID) | SUBCUTANEOUS | Status: DC
  Administered 2015-04-12: 2 [IU] via SUBCUTANEOUS
  Administered 2015-04-12 – 2015-04-13 (×2): 3 [IU] via SUBCUTANEOUS
  Administered 2015-04-13: 2 [IU] via SUBCUTANEOUS
  Administered 2015-04-13: 7 [IU] via SUBCUTANEOUS
  Administered 2015-04-14 (×2): 3 [IU] via SUBCUTANEOUS
  Administered 2015-04-14 – 2015-04-15 (×3): 2 [IU] via SUBCUTANEOUS

## 2015-04-12 MED ORDER — PIPERACILLIN-TAZOBACTAM 3.375 G IVPB
3.3750 g | Freq: Three times a day (TID) | INTRAVENOUS | Status: DC
  Administered 2015-04-12 – 2015-04-15 (×9): 3.375 g via INTRAVENOUS
  Filled 2015-04-12 (×10): qty 50

## 2015-04-12 MED ORDER — SODIUM CHLORIDE 0.9 % IV SOLN
INTRAVENOUS | Status: DC
  Administered 2015-04-12: 75 mL via INTRAVENOUS

## 2015-04-12 MED ORDER — VANCOMYCIN HCL 10 G IV SOLR
1250.0000 mg | INTRAVENOUS | Status: DC
  Administered 2015-04-12 – 2015-04-14 (×3): 1250 mg via INTRAVENOUS
  Filled 2015-04-12: qty 1250
  Filled 2015-04-12: qty 1000
  Filled 2015-04-12 (×2): qty 1250

## 2015-04-12 MED ORDER — SODIUM CHLORIDE 0.9 % IJ SOLN: 3.0000 mL | INTRAMUSCULAR | Status: DC | PRN

## 2015-04-12 MED ORDER — ACETAMINOPHEN 325 MG PO TABS
650.0000 mg | ORAL_TABLET | Freq: Four times a day (QID) | ORAL | Status: DC | PRN
  Administered 2015-04-12 – 2015-04-15 (×7): 650 mg via ORAL
  Filled 2015-04-12 (×7): qty 2

## 2015-04-12 MED ORDER — FUROSEMIDE 40 MG PO TABS
40.0000 mg | ORAL_TABLET | Freq: Every day | ORAL | Status: DC
  Administered 2015-04-12 – 2015-04-15 (×4): 40 mg via ORAL
  Filled 2015-04-12 (×4): qty 1

## 2015-04-12 NOTE — Progress Notes (Addendum)
Physical Therapy Wound Treatment Patient Details  Name: [name] MRN: [id number] Date of Birth: 14-May-1951  Today's Date: 04/12/2015 Time: 6387-5643 Time Calculation (min): 42 min  Subjective  Subjective: Pt states that he wore a pair of ill fitting shoes a little over 2 weeks ago.  The fifth toe of the right foot developed a small wound that gradually got larger-he has little to no sensation in this region due to DM Date of Onset: 03/27/15 Prior Treatments: none  Pain Score:    Wound Assessment  Pressure Ulcer 04/11/15 Stage II -  Partial thickness loss of dermis presenting as a shallow open ulcer with a red, pink wound bed without slough. 3-4 mm round OTA with grey wound bed, with linear area of pulled skin on inside of toe with pink wound bed (Active)  Dressing Type Cadexomer iodine 04/12/2015  2:01 PM  Dressing Change Frequency Daily 04/12/2015  2:01 PM  State of Healing Non-healing 04/12/2015  2:01 PM  Site / Wound Assessment Black;Granulation tissue 04/12/2015  2:01 PM  % Wound base Red or Granulating 15% 04/12/2015  2:01 PM  % Wound base Yellow 5% 04/12/2015  2:01 PM  % Wound base Black 80% 04/12/2015  2:01 PM  Peri-wound Assessment Intact;Edema 04/12/2015  2:01 PM  Wound Length (cm) 2 cm 04/12/2015  2:01 PM  Wound Width (cm) 2 cm 04/12/2015  2:01 PM  Undermining (cm) undermining of the dorsal/lateral wound is from the 10:00 position to 2:00 position of about 1.5 cm.Marland KitchenMarland KitchenMarland KitchenThere is obvious necrosis on the plantar surface of the toe extending to the medial surface at the MP joint.  there is an open slit 3 cm long and unermined 1.5 cm into the plantar aspect of his toe 04/12/2015  2:01 PM  Margins Unattached edges (unapproximated) 04/12/2015  2:01 PM  Drainage Amount Minimal 04/12/2015  2:01 PM  Drainage Description Serosanguineous;Odor 04/12/2015  2:01 PM  Treatment Debridement (Selective);Hydrotherapy (Pulse lavage);Packing (Impregnated strip) 04/12/2015  2:01 PM     Incision 09/05/12 Eye Left  (Active)     Incision 10/03/12 Eye Right (Active)   Hydrotherapy Pulsed lavage therapy - wound location: dorsilateral and medial aspects of the 5th toe, right foot Pulsed Lavage with Suction - Normal Saline Used: 1000 mL Pulsed Lavage Tip: Tip with splash shield Selective Debridement Selective Debridement - Location: 5th toe, dorsilateral aspect Selective Debridement - Tools Used: Forceps;Scissors Selective Debridement - Tissue Removed: necrotic tissue covering the dorsilateral wound was removed (about 40%) to reveal a min amount of pink granulation tissue   Wound Assessment and Plan  Wound Therapy - Assess/Plan/Recommendations Wound Therapy - Clinical Statement: Pt has a draining wound on the 5th toe, right foot.  MRI indicates the possible presence of  osteomyelitis.  He also has significant edema of the right calf/ankle and some cellulitis in the region of the ankle.  MRI also reveals the fx of the proximal and distal aspects of the proximal phalanx of the right 5th toe. Wound Therapy - Functional Problem List: Pt already has a left AKA from a foot wound similar to the current one.  He is a diabetic and is obese. Factors Delaying/Impairing Wound Healing: Infection - systemic/local Hydrotherapy Plan: Debridement;Dressing change;Pulsatile lavage with suction;Patient/family education Wound Therapy - Frequency: 6X / week Wound Therapy - Current Recommendations: PT Wound Therapy - Follow Up Recommendations: Home health RN Wound Plan: MD will be monitoring the condition of the toe and assess for need of amputation   Wound Therapy Goals- Improve the  function of patient's integumentary system by progressing the wound(s) through the phases of wound healing (inflammation - proliferation - remodeling) by: Decrease Necrotic Tissue to: 50% Decrease Necrotic Tissue - Progress: Goal set today Increase Granulation Tissue to: 25% Increase Granulation Tissue - Progress: Goal set today Time For Goal  Achievement: 2 weeks Wound Therapy - Potential for Goals: Fair  Goals will be updated until maximal potential achieved or discharge criteria met.  Discharge criteria: when goals achieved, discharge from hospital, MD decision/surgical intervention, no progress towards goals, refusal/missing three consecutive treatments without notification or medical reason.  GP     [name] 04/12/2015, 2:20 PM

## 2015-04-12 NOTE — Progress Notes (Signed)
[facility] for Vancomycin & Zosyn Indication: wound infection  Allergies  Allergen Reactions  . Zolpidem Tartrate Other (See Comments)    disorientation     Patient Measurements: Height: 6' (182.9 cm) Weight: 254 lb 10.1 oz (115.5 kg) IBW/kg (Calculated) : 77.6  Vital Signs: Temp: 98.5 F (36.9 C) (06/13 0618) Temp Source: Oral (06/13 0618) BP: 119/69 mmHg (06/13 0618) Pulse Rate: 78 (06/13 0618) Intake/Output from previous day: 06/12 0701 - 06/13 0700 In: 300 [I.V.:300] Out: -  Intake/Output from this shift:    Labs:  Recent Labs  04/12/15 0023  WBC 13.6*  HGB 11.4*  PLT 250  CREATININE 2.19*   Estimated Creatinine Clearance: 45.9 mL/min (by C-G formula based on Cr of 2.19). No results for input(s): VANCOTROUGH, VANCOPEAK, VANCORANDOM, GENTTROUGH, GENTPEAK, GENTRANDOM, TOBRATROUGH, TOBRAPEAK, TOBRARND, AMIKACINPEAK, AMIKACINTROU, AMIKACIN in the last 72 hours.   Microbiology: Recent Results (from the past 720 hour(s))  Blood Culture (routine x 2)     Status: None (Preliminary result)   Collection Time: 04/12/15 12:23 AM  Result Value Ref Range Status   Specimen Description RIGHT ANTECUBITAL  Final   Special Requests BOTTLES DRAWN AEROBIC ONLY 8CC  Final   Culture PENDING  Incomplete   Report Status PENDING  Incomplete  Blood Culture (routine x 2)     Status: None (Preliminary result)   Collection Time: 04/12/15 12:37 AM  Result Value Ref Range Status   Specimen Description BLOOD RIGHT HAND  Final   Special Requests BOTTLES DRAWN AEROBIC ONLY 8CC  Final   Culture PENDING  Incomplete   Report Status PENDING  Incomplete    Anti-infectives    Start     Dose/Rate Route Frequency Ordered Stop   04/12/15 0900  piperacillin-tazobactam (ZOSYN) IVPB 3.375 g     3.375 g 12.5 mL/hr over 240 Minutes Intravenous  Once 04/12/15 0406     04/12/15 0100  vancomycin (VANCOCIN) IVPB 1000 mg/200 mL premix     1,000 mg 200 mL/hr over 60  Minutes Intravenous  Once 04/12/15 0051 04/12/15 0159   04/12/15 0100  piperacillin-tazobactam (ZOSYN) IVPB 3.375 g     3.375 g 100 mL/hr over 30 Minutes Intravenous  Once 04/12/15 0051 04/12/15 0139      Assessment: 64 yo M with hx CKD, DM, and left AKA presents with right toe infection and fever.  Foot xray concerning for osteo, MRI pending.  Empiric, broad-spectrum antibiotics were initiated in ED.   He is currently afebrile (Tm 100.61F) with mild leukocytosis.  Cx data pending.  He has known CKD, however Scr elevated above patient's baseline. NCrCl ~ 48ml/min.  Goal of Therapy:  Vancomycin trough level 15-20 mcg/ml  Plan:  Zosyn 3.375gm IV Q8h to be infused over 4hrs Vancomycin 1250mg  IV q24h- to start this morning to complete loading dose Check Vancomycin trough at steady state Monitor renal function and cx data   Biagio Borg 04/12/2015,7:34 AM

## 2015-04-12 NOTE — Consult Note (Signed)
Reason for Consult:Infection of the right little toe in a diabetic.  Urgent consult. Referring Physician: Hospitalist  [name] is an 64 y.o. male.  HPI: He has had swelling of the little toe on the right for a week.  He has had some drainage.  He wore a new pair of shoes he said and then noticed the problem.  He let it go on until yesterday.  At the urging of his family he came to the ER.  He is a diabetic for over twenty-five years.  He said his last A1C is 5.5.  He has watched his blood sugars carefully.  X-rays and MRI suggest possible osteomyelitis of the distal phalanx.  He has soft tissue swelling and purulent drainage.  He has no other wound problems.  Past Medical History  Diagnosis Date  . Hypertension   . Hyperlipidemia   . Type 2 Diabetes mellitus   . Necrosis     #2 nail   . CKD (chronic kidney disease) 06/28/2014    Past Surgical History  Procedure Laterality Date  . Above knee leg amputation  2004  . Cataract extraction w/phaco  09/05/2012    Procedure: CATARACT EXTRACTION PHACO AND INTRAOCULAR LENS PLACEMENT (IOC);  Surgeon: Tonny Branch, MD;  Location: [facility];  Service: Ophthalmology;  Laterality: Left;  CDE=5.45  . Cataract extraction w/phaco  10/03/2012    Procedure: CATARACT EXTRACTION PHACO AND INTRAOCULAR LENS PLACEMENT (IOC);  Surgeon: Tonny Branch, MD;  Location: [facility];  Service: Ophthalmology;  Laterality: Right;  CDE: 12.31    Family History  Problem Relation Age of Onset  . Diabetes Mother   . Hypertension Mother   . Cancer Father   . Cancer Sister   . Sickle cell anemia Daughter     Social History:  reports that he has never smoked. He has never used smokeless tobacco. He reports that he does not drink alcohol or use illicit drugs.  Allergies:  Allergies  Allergen Reactions  . Zolpidem Tartrate Other (See Comments)    disorientation     Medications: I have reviewed the patient's current medications.  Results for orders placed or performed during  the hospital encounter of 04/11/15 (from the past 48 hour(s))  Comprehensive metabolic panel     Status: Abnormal   Collection Time: 04/12/15 12:23 AM  Result Value Ref Range   Sodium 133 (L) 135 - 145 mmol/L   Potassium 4.0 3.5 - 5.1 mmol/L   Chloride 100 (L) 101 - 111 mmol/L   CO2 23 22 - 32 mmol/L   Glucose, Bld 194 (H) 65 - 99 mg/dL   BUN 36 (H) 6 - 20 mg/dL   Creatinine, Ser 2.19 (H) 0.61 - 1.24 mg/dL   Calcium 8.3 (L) 8.9 - 10.3 mg/dL   Total Protein 6.7 6.5 - 8.1 g/dL   Albumin 2.6 (L) 3.5 - 5.0 g/dL   AST 17 15 - 41 U/L   ALT 14 (L) 17 - 63 U/L   Alkaline Phosphatase 65 38 - 126 U/L   Total Bilirubin 0.9 0.3 - 1.2 mg/dL   GFR calc non Af Amer 30 (L) >60 mL/min   GFR calc Af Amer 35 (L) >60 mL/min    Comment: (NOTE) The eGFR has been calculated using the CKD EPI equation. This calculation has not been validated in all clinical situations. eGFR's persistently <60 mL/min signify possible Chronic Kidney Disease.    Anion gap 10 5 - 15  CBC WITH DIFFERENTIAL  Status: Abnormal   Collection Time: 04/12/15 12:23 AM  Result Value Ref Range   WBC 13.6 (H) 4.0 - 10.5 K/uL   RBC 3.77 (L) 4.22 - 5.81 MIL/uL   Hemoglobin 11.4 (L) 13.0 - 17.0 g/dL   HCT 33.2 (L) 39.0 - 52.0 %   MCV 88.1 78.0 - 100.0 fL   MCH 30.2 26.0 - 34.0 pg   MCHC 34.3 30.0 - 36.0 g/dL   RDW 13.7 11.5 - 15.5 %   Platelets 250 150 - 400 K/uL   Neutrophils Relative % 75 43 - 77 %   Neutro Abs 10.2 (H) 1.7 - 7.7 K/uL   Lymphocytes Relative 14 12 - 46 %   Lymphs Abs 1.9 0.7 - 4.0 K/uL   Monocytes Relative 10 3 - 12 %   Monocytes Absolute 1.3 (H) 0.1 - 1.0 K/uL   Eosinophils Relative 1 0 - 5 %   Eosinophils Absolute 0.1 0.0 - 0.7 K/uL   Basophils Relative 0 0 - 1 %   Basophils Absolute 0.0 0.0 - 0.1 K/uL  Blood Culture (routine x 2)     Status: None (Preliminary result)   Collection Time: 04/12/15 12:23 AM  Result Value Ref Range   Specimen Description RIGHT ANTECUBITAL    Special Requests BOTTLES  DRAWN AEROBIC ONLY 8CC    Culture NO GROWTH <24 HRS    Report Status PENDING   Blood Culture (routine x 2)     Status: None (Preliminary result)   Collection Time: 04/12/15 12:37 AM  Result Value Ref Range   Specimen Description BLOOD RIGHT HAND    Special Requests BOTTLES DRAWN AEROBIC ONLY 8CC    Culture NO GROWTH <24 HRS    Report Status PENDING   I-Stat CG4 Lactic Acid, ED  (not at  [facility])     Status: None   Collection Time: 04/12/15 12:44 AM  Result Value Ref Range   Lactic Acid, Venous 0.60 0.5 - 2.0 mmol/L  Urinalysis, Routine w reflex microscopic (not at [facility])     Status: Abnormal   Collection Time: 04/12/15  2:24 AM  Result Value Ref Range   Color, Urine YELLOW YELLOW   APPearance CLEAR CLEAR   Specific Gravity, Urine 1.010 1.005 - 1.030   pH 5.5 5.0 - 8.0   Glucose, UA NEGATIVE NEGATIVE mg/dL   Hgb urine dipstick TRACE (A) NEGATIVE   Bilirubin Urine NEGATIVE NEGATIVE   Ketones, ur NEGATIVE NEGATIVE mg/dL   Protein, ur NEGATIVE NEGATIVE mg/dL   Urobilinogen, UA 0.2 0.0 - 1.0 mg/dL   Nitrite NEGATIVE NEGATIVE   Leukocytes, UA NEGATIVE NEGATIVE  Urine microscopic-add on     Status: Abnormal   Collection Time: 04/12/15  2:24 AM  Result Value Ref Range   Squamous Epithelial / LPF RARE RARE   WBC, UA 0-2 <3 WBC/hpf   RBC / HPF 0-2 <3 RBC/hpf   Bacteria, UA FEW (A) RARE  Glucose, capillary     Status: Abnormal   Collection Time: 04/12/15  7:45 AM  Result Value Ref Range   Glucose-Capillary 166 (H) 65 - 99 mg/dL   Comment 1 Notify RN    Comment 2 Document in Chart   Glucose, capillary     Status: Abnormal   Collection Time: 04/12/15 11:33 AM  Result Value Ref Range   Glucose-Capillary 184 (H) 65 - 99 mg/dL   Comment 1 Notify RN    Comment 2 Document in Chart     Mr Foot Right Wo Contrast  04/12/2015   CLINICAL DATA:  Cellulitis and open wound on the distal little toe.  EXAM: MRI OF THE RIGHT FOREFOOT WITHOUT CONTRAST  TECHNIQUE: Multiplanar, multisequence MR  imaging was performed. No intravenous contrast was administered.  COMPARISON:  Radiographs dated 04/12/2015  FINDINGS: There is a soft tissue ulceration at the lateral aspect of the PIP joint of the little toe. There is a nonunion fracture of the proximal shaft of the proximal phalanx and a subtle fracture of the distal proximal phalanx. There is poorly defined fluid in and around the PIP joint and extending proximally along the proximal phalanx with this mall effusion in the fifth metatarsal phalangeal joint.  There are moderate arthritic changes at the second and third tarsometatarsal joints and between the bases of the first and second and second and third and third and fourth metatarsals.  There is slight nonspecific dorsal subcutaneous soft tissue edema. No  Moderate arthritis of the first metatarsal phalangeal joint with hallux valgus deformity and bunion formation.  IMPRESSION: Fractures of the proximal and distal aspects of proximal phalanx of the little toe with an overlying soft tissue ulceration. Adjacent fluid in the soft tissues could represent infection. I suspect the patient has osteomyelitis of the distal aspect of the proximal phalanx.   Electronically Signed   By: Lorriane Shire M.D.   On: 04/12/2015 09:33   Dg Chest Portable 1 View  04/12/2015   CLINICAL DATA:  Right foot pain and swelling with redness to the fifth digit for 2 weeks. Fever and cough for 1 week.  EXAM: PORTABLE CHEST - 1 VIEW  COMPARISON:  06/26/2014  FINDINGS: The heart size and mediastinal contours are within normal limits. Both lungs are clear. The visualized skeletal structures are unremarkable.  IMPRESSION: No active disease.   Electronically Signed   By: Lucienne Capers M.D.   On: 04/12/2015 02:46   Dg Foot Complete Right  04/12/2015   CLINICAL DATA:  Right fifth toe pain, with fever, swelling and erythema for 2 weeks. Initial encounter.  EXAM: RIGHT FOOT COMPLETE - 3+ VIEW  COMPARISON:  None.  FINDINGS: There is vague  lucency involving the distal aspect of the fifth proximal phalanx, raising question for underlying osteomyelitis, though this is only seen on a single image. Surrounding soft tissue swelling is noted at the fifth toe.  The joint spaces are preserved. There is no evidence of talar subluxation; the subtalar joint is unremarkable in appearance. Plantar and posterior calcaneal spurs are seen. A prominent os peroneum is noted. There is a bipartite medial sesamoid of the first toe.  Diffuse vascular calcifications are seen.  IMPRESSION: 1. Vague lucency involving the distal aspect of the fifth proximal phalanx, raising question for underlying osteomyelitis, though this is only seen on a single image. MRI could be considered for further evaluation, as deemed clinically appropriate. 2. Diffuse vascular calcifications seen. 3. Prominent os peroneum noted. 4. Bipartite medial sesamoid of the first toe.   Electronically Signed   By: Garald Balding M.D.   On: 04/12/2015 00:42    Review of Systems  Genitourinary:       History of chronic kidney disease  Musculoskeletal: Positive for joint pain (Right little toe swelling and pain and drainage for a week after wearing new pair of shoes.).  Neurological:       History of TIA's and confusion and amnesia.  Endo/Heme/Allergies:       History of diabetes mellitus for over twenty-five years.   Blood pressure 119/69,  pulse 78, temperature 98.5 F (36.9 C), temperature source Oral, resp. rate 15, height 6' (1.829 m), weight 115.5 kg (254 lb 10.1 oz), SpO2 100 %. Physical Exam  Constitutional: He is oriented to person, place, and time. He appears well-developed and well-nourished.  HENT:  Head: Normocephalic and atraumatic.  Eyes: Conjunctivae and EOM are normal. Pupils are equal, round, and reactive to light.  Neck: Normal range of motion. Neck supple.  Cardiovascular: Normal rate, regular rhythm and intact distal pulses.   Respiratory: Effort normal.  GI: Soft.   Musculoskeletal: He exhibits tenderness (He has swelling of the right little toe with drainage of purulent material and eschar over the lateral side of the little toe.  Motion is not tender.  He has no swelling or red streaks up the foot.  ).  Neurological: He is alert and oriented to person, place, and time. He has normal reflexes.  Skin: Skin is warm and dry.  Psychiatric: He has a normal mood and affect. His behavior is normal. Judgment and thought content normal.    Assessment/Plan: Infection of the right little toe with drainage, eschar and ulcer laterally.  MRI suggests possible osteomyelitis of the distal phalanx. Diabetes Mellitus for over twenty-five years well controlled until just now.  I have talked to the patient and his wife of the findings today.  He does have an infection and drainage of the little toe.  He will need pulse lavage of the little toe later today by physical therapy.  I have already talked to the therapist.  I have told the family he will be on antibiotics IV for the next several days.  I will see how the toe responds to the medicine.  He may need surgery, including possible amputation of the toe, if he does not respond well.  He knows diabetic patients have delayed healing times and also have poor circulation problems as well.    I will follow.  Continue antibiotics IV and PT.  [name] 04/12/2015, 11:54 AM

## 2015-04-12 NOTE — Care Management Note (Signed)
Case Management Note  Patient Details  Name: CAELAN MORIARITY MRN: [id number] Date of Birth: 1951/03/07  Subjective/Objective:                  Pt admitted from home with diabetic foot ulcer. Pt lives with his wife and will return home at discharge. Pt is a left AKA.  Action/Plan: Will continue to follow for discharge planning needs.  Expected Discharge Date:  04/14/15               Expected Discharge Plan:  [place]  In-House Referral:  NA  Discharge planning Services  CM Consult  Post Acute Care Choice:    Choice offered to:     DME Arranged:    DME Agency:     HH Arranged:    HH Agency:     Status of Service:  In process, will continue to follow  Medicare Important Message Given:    Date Medicare IM Given:    Medicare IM give by:    Date Additional Medicare IM Given:    Additional Medicare Important Message give by:     If discussed at [place] of Stay Meetings, dates discussed:    Additional Comments:  Joylene Draft, RN 04/12/2015, 1:39 PM

## 2015-04-12 NOTE — Consult Note (Signed)
WOC wound consult note Reason for Consult: Ulceration to right 5th metatarsal.  Diabetes and history of BKA left leg.  Wound type: Neuropathic ulcer, with fracture metatarsal and osteomyelitis Pressure Ulcer POA: N/A  Orthopedic surgery consult has been requested.  MRI indicates osteomyelitis right 5th metatarsal.  Will defer to surgery for plan of care at this time.   Will not follow at this time.  Please re-consult if needed.  Domenic Moras RN BSN [facility] Pager [phone]

## 2015-04-12 NOTE — Progress Notes (Signed)
UR chart review completed.  

## 2015-04-12 NOTE — Progress Notes (Signed)
ANTIBIOTIC CONSULT NOTE-Preliminary  Pharmacy Consult for Zosyn Indication: wound infection  Allergies  Allergen Reactions  . Zolpidem Tartrate Other (See Comments)    disorientation     Patient Measurements: Height: 6' (182.9 cm) Weight: 260 lb (117.935 kg) IBW/kg (Calculated) : 77.6   Vital Signs: Temp: 98.2 F (36.8 C) (06/13 0221) Temp Source: Oral (06/13 0221) BP: 105/69 mmHg (06/13 0221) Pulse Rate: 74 (06/13 0221)  Labs:  Recent Labs  04/12/15 0023  WBC 13.6*  HGB 11.4*  PLT 250  CREATININE 2.19*    Estimated Creatinine Clearance: 46.4 mL/min (by C-G formula based on Cr of 2.19).  No results for input(s): VANCOTROUGH, VANCOPEAK, VANCORANDOM, GENTTROUGH, GENTPEAK, GENTRANDOM, TOBRATROUGH, TOBRAPEAK, TOBRARND, AMIKACINPEAK, AMIKACINTROU, AMIKACIN in the last 72 hours.   Microbiology: Recent Results (from the past 720 hour(s))  Blood Culture (routine x 2)     Status: None (Preliminary result)   Collection Time: 04/12/15 12:23 AM  Result Value Ref Range Status   Specimen Description RIGHT ANTECUBITAL  Final   Special Requests BOTTLES DRAWN AEROBIC ONLY 8CC  Final   Culture PENDING  Incomplete   Report Status PENDING  Incomplete  Blood Culture (routine x 2)     Status: None (Preliminary result)   Collection Time: 04/12/15 12:37 AM  Result Value Ref Range Status   Specimen Description BLOOD RIGHT HAND  Final   Special Requests BOTTLES DRAWN AEROBIC ONLY [place]  Final   Culture PENDING  Incomplete   Report Status PENDING  Incomplete    Medical History: Past Medical History  Diagnosis Date  . Hypertension   . Hyperlipidemia   . Type 2 Diabetes mellitus   . Necrosis     #2 nail   . CKD (chronic kidney disease) 06/28/2014    Medications:  Scheduled:  . atenolol  25 mg Oral Daily  . cilostazol  100 mg Oral BID  . clopidogrel  75 mg Oral Daily  . enoxaparin (LOVENOX) injection  40 mg Subcutaneous Q24H  . furosemide  40 mg Oral Daily  . insulin aspart   0-9 Units Subcutaneous TID WC  . insulin glargine  25 Units Subcutaneous QHS  . piperacillin-tazobactam (ZOSYN)  IV  3.375 g Intravenous Once  . rosuvastatin  20 mg Oral Daily  . sodium chloride  3 mL Intravenous Q12H    Assessment: 64 yo male with PMH HTN, CKD, left AKA from diabetic wound infection presented with R 5th digit toe pain with fever, swelling, and redness. Concern for diabetic foot infection vs cellulitis vs osteomyelitis. Possible early gangrene. Pt given 1 gram vancomycin in ED and 3.375 grams Zosyn in ED.   Goal of Therapy:  eradication of infeciton  Plan:  Preliminary review of pertinent patient information completed.  Protocol will be initiated with a one-time dose(s) of Zosyn 3.375 grams at 0900.  Forestine Na clinical pharmacist will complete review during morning rounds to assess patient and finalize treatment regimen.  Nyra Capes, [facility] 04/12/2015,4:07 AM

## 2015-04-12 NOTE — Progress Notes (Signed)
[facility] PROGRESS NOTE  [name] [id number] DOB: 1951/05/08 DOA: 04/11/2015 PCP: [name], MD Brief narrative 64 year old obese male with history of insulin-dependent diabetes, chronic in the disease stage II, history of left AKA , essential hypertension who presented with one-week history of worsening redness with swelling of the right fifth toe and the foot. MRI of the right foot shows cellulitis of the right fifth toe with concern for osteomyelitis.   Assessment/Plan: Right toe cellulitis with ulcer/? Osteomyelitis Continue empiric IV vancomycin and Zosyn. Orthopedic surgery consulted. Recommend pulse lavage of the little toe by PT. Recommends to continue IV antibiotic for now and will reevaluate tomorrow and decide on surgery if patient does not improve. Pain control with when necessary Tylenol. Appreciate wound care consult.  Type 2 diabetes mellitus Resume home dose Lantus and sliding scale insulin. Ports his blood sugar to be well controlled and last A1c several months ago was 5. 9. Check A1c.  Acute on chronic kidney disease stage II Baseline creatinine around 1.8. Monitor with gentle hydration.  Essential hypertension Continue Lasix and atenolol.  Peripheral vascular disease Continue Plavix and Pletal. Continue statin  Diet: Heart healthy/diabetic  DVT prophylaxis: Subcutaneous heparin  Code Status: Full code Family Communication: Family at bedside Disposition Plan: Continue inpatient monitoring for now   Consultants:  Orthopedics  Procedures:  MRI right foot  Antibiotics:  IV vancomycin and Zosyn since 6/13  HPI/Subjective: Patient seen and examined. Reports minimal pain in his right foot. Reports off and on diminished sensation in the foot.  Objective: Filed Vitals:   04/12/15 1300  BP:   Pulse:   Temp: 99 F (37.2 C)  Resp:     Intake/Output Summary (Last 24 hours) at 04/12/15 1326 Last data filed at 04/12/15 1200  Gross per  24 hour  Intake    300 ml  Output      0 ml  Net    300 ml   Filed Weights   04/11/15 2225 04/12/15 0618  Weight: 117.935 kg (260 lb) 115.5 kg (254 lb 10.1 oz)    Exam:   General:  Middle aged obese male in no distress  HEENT: No pallor, moist oral mucosa, supple neck  Chest: Clear to auscultation bilaterally, no added sounds  CVS: Normal S1 and S2, no murmurs rub or gallop  GI: Soft, nondistended, nontender, bowel sounds present  Musculoskeletal: Warm, swelling over right foot with ulceration over right little toe with purulent discharge, nontender, distal pulses palpable  CNS: Alert and oriented    Data Reviewed: Basic Metabolic Panel:  Recent Labs Lab 04/12/15 0023  NA 133*  K 4.0  CL 100*  CO2 23  GLUCOSE 194*  BUN 36*  CREATININE 2.19*  CALCIUM 8.3*   Liver Function Tests:  Recent Labs Lab 04/12/15 0023  AST 17  ALT 14*  ALKPHOS 65  BILITOT 0.9  PROT 6.7  ALBUMIN 2.6*   No results for input(s): LIPASE, AMYLASE in the last 168 hours. No results for input(s): AMMONIA in the last 168 hours. CBC:  Recent Labs Lab 04/12/15 0023  WBC 13.6*  NEUTROABS 10.2*  HGB 11.4*  HCT 33.2*  MCV 88.1  PLT 250   Cardiac Enzymes: No results for input(s): CKTOTAL, CKMB, CKMBINDEX, TROPONINI in the last 168 hours. BNP (last 3 results) No results for input(s): BNP in the last 8760 hours.  ProBNP (last 3 results) No results for input(s): PROBNP in the last 8760 hours.  CBG:  Recent Labs Lab 04/12/15  0745 04/12/15 1133  GLUCAP 166* 184*    Recent Results (from the past 240 hour(s))  Blood Culture (routine x 2)     Status: None (Preliminary result)   Collection Time: 04/12/15 12:23 AM  Result Value Ref Range Status   Specimen Description RIGHT ANTECUBITAL  Final   Special Requests BOTTLES DRAWN AEROBIC ONLY 8CC  Final   Culture NO GROWTH <24 HRS  Final   Report Status PENDING  Incomplete  Blood Culture (routine x 2)     Status: None (Preliminary  result)   Collection Time: 04/12/15 12:37 AM  Result Value Ref Range Status   Specimen Description BLOOD RIGHT HAND  Final   Special Requests BOTTLES DRAWN AEROBIC ONLY 8CC  Final   Culture NO GROWTH <24 HRS  Final   Report Status PENDING  Incomplete     Studies: Mr Foot Right Wo Contrast  04/12/2015   CLINICAL DATA:  Cellulitis and open wound on the distal little toe.  EXAM: MRI OF THE RIGHT FOREFOOT WITHOUT CONTRAST  TECHNIQUE: Multiplanar, multisequence MR imaging was performed. No intravenous contrast was administered.  COMPARISON:  Radiographs dated 04/12/2015  FINDINGS: There is a soft tissue ulceration at the lateral aspect of the PIP joint of the little toe. There is a nonunion fracture of the proximal shaft of the proximal phalanx and a subtle fracture of the distal proximal phalanx. There is poorly defined fluid in and around the PIP joint and extending proximally along the proximal phalanx with this mall effusion in the fifth metatarsal phalangeal joint.  There are moderate arthritic changes at the second and third tarsometatarsal joints and between the bases of the first and second and second and third and third and fourth metatarsals.  There is slight nonspecific dorsal subcutaneous soft tissue edema. No  Moderate arthritis of the first metatarsal phalangeal joint with hallux valgus deformity and bunion formation.  IMPRESSION: Fractures of the proximal and distal aspects of proximal phalanx of the little toe with an overlying soft tissue ulceration. Adjacent fluid in the soft tissues could represent infection. I suspect the patient has osteomyelitis of the distal aspect of the proximal phalanx.   Electronically Signed   By: Lorriane Shire M.D.   On: 04/12/2015 09:33   Dg Chest Portable 1 View  04/12/2015   CLINICAL DATA:  Right foot pain and swelling with redness to the fifth digit for 2 weeks. Fever and cough for 1 week.  EXAM: PORTABLE CHEST - 1 VIEW  COMPARISON:  06/26/2014  FINDINGS:  The heart size and mediastinal contours are within normal limits. Both lungs are clear. The visualized skeletal structures are unremarkable.  IMPRESSION: No active disease.   Electronically Signed   By: Lucienne Capers M.D.   On: 04/12/2015 02:46   Dg Foot Complete Right  04/12/2015   CLINICAL DATA:  Right fifth toe pain, with fever, swelling and erythema for 2 weeks. Initial encounter.  EXAM: RIGHT FOOT COMPLETE - 3+ VIEW  COMPARISON:  None.  FINDINGS: There is vague lucency involving the distal aspect of the fifth proximal phalanx, raising question for underlying osteomyelitis, though this is only seen on a single image. Surrounding soft tissue swelling is noted at the fifth toe.  The joint spaces are preserved. There is no evidence of talar subluxation; the subtalar joint is unremarkable in appearance. Plantar and posterior calcaneal spurs are seen. A prominent os peroneum is noted. There is a bipartite medial sesamoid of the first toe.  Diffuse vascular calcifications  are seen.  IMPRESSION: 1. Vague lucency involving the distal aspect of the fifth proximal phalanx, raising question for underlying osteomyelitis, though this is only seen on a single image. MRI could be considered for further evaluation, as deemed clinically appropriate. 2. Diffuse vascular calcifications seen. 3. Prominent os peroneum noted. 4. Bipartite medial sesamoid of the first toe.   Electronically Signed   By: Garald Balding M.D.   On: 04/12/2015 00:42    Scheduled Meds: . atenolol  25 mg Oral Daily  . cilostazol  100 mg Oral BID  . clopidogrel  75 mg Oral Daily  . enoxaparin (LOVENOX) injection  40 mg Subcutaneous Q24H  . furosemide  40 mg Oral Daily  . insulin aspart  0-9 Units Subcutaneous TID WC  . insulin glargine  25 Units Subcutaneous QHS  . piperacillin-tazobactam (ZOSYN)  IV  3.375 g Intravenous Q8H  . rosuvastatin  20 mg Oral Daily  . sodium chloride  3 mL Intravenous Q12H  . vancomycin  1,250 mg Intravenous Q24H    Continuous Infusions: . sodium chloride        Time spent: [place], [place]  [facility] Pager [phone]. If 7PM-7AM, please contact night-coverage at [email or web address], password [facility] 04/12/2015, 1:26 PM  LOS: 0 days

## 2015-04-12 NOTE — H&P (Signed)
PCP:   [name], MD   Chief Complaint:  Right toe infected  HPI: 64 yo male h/o iddm, ckd, htn, hld s/p aka left lower extremity comes in with over one week of worsening redness, swelling to right 5th toe, foot up to ankle.  He has an open sore to 5th toe on the right, with redness extending up to ankle with associated swelling.  No fevers at home.  No recent antibiotic use.  Has not seen a doctor about this.  Denies any trauma to this foot.  This happened 12 years ago to his left leg which ended up in amputation.  Reports his glucose has been doing very well at home.  Review of Systems:  Positive and negative as per HPI otherwise all other systems are negative  Past Medical History: Past Medical History  Diagnosis Date  . Hypertension   . Hyperlipidemia   . Type 2 Diabetes mellitus   . Necrosis     #2 nail   . CKD (chronic kidney disease) 06/28/2014   Past Surgical History  Procedure Laterality Date  . Above knee leg amputation  2004  . Cataract extraction w/phaco  09/05/2012    Procedure: CATARACT EXTRACTION PHACO AND INTRAOCULAR LENS PLACEMENT (IOC);  Surgeon: Tonny Branch, MD;  Location: [facility];  Service: Ophthalmology;  Laterality: Left;  CDE=5.45  . Cataract extraction w/phaco  10/03/2012    Procedure: CATARACT EXTRACTION PHACO AND INTRAOCULAR LENS PLACEMENT (IOC);  Surgeon: Tonny Branch, MD;  Location: [facility];  Service: Ophthalmology;  Laterality: Right;  CDE: 12.31    Medications: Prior to Admission medications   Medication Sig Start Date End Date Taking? Authorizing Provider  acetaminophen (TYLENOL) 500 MG tablet Take 500-1,500 mg by mouth every 6 (six) hours as needed for mild pain or moderate pain.    Historical Provider, MD  atenolol (TENORMIN) 25 MG tablet Take 1 tablet (25 mg total) by mouth daily. 02/23/15   Wardell Honour, MD  BYETTA 10 MCG PEN 10 MCG/0.04ML SOPN injection Inject 10 mcg into the skin 2 (two) times daily. 01/13/15   Historical Provider, MD  Calcium  Carb-Cholecalciferol (CALCIUM PLUS VITAMIN D3) 600-500 MG-UNIT CAPS Take 1 capsule by mouth daily.     Historical Provider, MD  cilostazol (PLETAL) 100 MG tablet Take 1 tablet (100 mg total) by mouth 2 (two) times daily. 02/23/15   Wardell Honour, MD  clopidogrel (PLAVIX) 75 MG tablet Take 1 tablet (75 mg total) by mouth daily. 02/23/15   Wardell Honour, MD  furosemide (LASIX) 40 MG tablet Take 1 tablet (40 mg total) by mouth daily. 02/23/15   Wardell Honour, MD  insulin aspart (NOVOLOG) 100 UNIT/ML injection Inject 10 Units into the skin 3 (three) times daily before meals.     Historical Provider, MD  LANTUS SOLOSTAR 100 UNIT/ML Solostar Pen Inject 25 Units into the skin at bedtime. 01/09/15   Historical Provider, MD  rosuvastatin (CRESTOR) 20 MG tablet Take 1 tablet (20 mg total) by mouth daily. 02/23/15   Wardell Honour, MD    Allergies:   Allergies  Allergen Reactions  . Zolpidem Tartrate Other (See Comments)    disorientation     Social History:  reports that he has never smoked. He has never used smokeless tobacco. He reports that he does not drink alcohol or use illicit drugs.  Family History: Family History  Problem Relation Age of Onset  . Diabetes Mother   . Hypertension Mother   .  Cancer Father   . Cancer Sister   . Sickle cell anemia Daughter     Physical Exam: Filed Vitals:   04/11/15 2225 04/12/15 0221  BP: 110/63 105/69  Pulse: 87 74  Temp: 100.8 F (38.2 C) 98.2 F (36.8 C)  TempSrc: Oral Oral  Resp: 20 20  Height: 6' (1.829 m)   Weight: 117.935 kg (260 lb)   SpO2: 98% 95%   General appearance: alert, cooperative and no distress Head: Normocephalic, without obvious abnormality, atraumatic Eyes: negative Nose: Nares normal. Septum midline. Mucosa normal. No drainage or sinus tenderness. Neck: no JVD and supple, symmetrical, trachea midline Lungs: clear to auscultation bilaterally Heart: regular rate and rhythm, S1, S2 normal, no murmur, click, rub  or gallop Abdomen: soft, non-tender; bowel sounds normal; no masses,  no organomegaly Extremities: left aka stump looks good, right 5th toe with open wound mild necrosis with surrouding erythema to forefoot up to ankle c/w cellulitis with associated swelling up to ankle Pulses: 2+ and symmetric Skin: Skin color, texture, turgor normal. No rashes or lesions x what is stated above concerning rle Neurologic: Grossly normal   Labs on Admission:   Recent Labs  04/12/15 0023  NA 133*  K 4.0  CL 100*  CO2 23  GLUCOSE 194*  BUN 36*  CREATININE 2.19*  CALCIUM 8.3*    Recent Labs  04/12/15 0023  AST 17  ALT 14*  ALKPHOS 65  BILITOT 0.9  PROT 6.7  ALBUMIN 2.6*    Recent Labs  04/12/15 0023  WBC 13.6*  NEUTROABS 10.2*  HGB 11.4*  HCT 33.2*  MCV 88.1  PLT 250   Radiological Exams on Admission: Dg Chest Portable 1 View  04/12/2015   CLINICAL DATA:  Right foot pain and swelling with redness to the fifth digit for 2 weeks. Fever and cough for 1 week.  EXAM: PORTABLE CHEST - 1 VIEW  COMPARISON:  06/26/2014  FINDINGS: The heart size and mediastinal contours are within normal limits. Both lungs are clear. The visualized skeletal structures are unremarkable.  IMPRESSION: No active disease.   Electronically Signed   By: Lucienne Capers M.D.   On: 04/12/2015 02:46   Dg Foot Complete Right  04/12/2015   CLINICAL DATA:  Right fifth toe pain, with fever, swelling and erythema for 2 weeks. Initial encounter.  EXAM: RIGHT FOOT COMPLETE - 3+ VIEW  COMPARISON:  None.  FINDINGS: There is vague lucency involving the distal aspect of the fifth proximal phalanx, raising question for underlying osteomyelitis, though this is only seen on a single image. Surrounding soft tissue swelling is noted at the fifth toe.  The joint spaces are preserved. There is no evidence of talar subluxation; the subtalar joint is unremarkable in appearance. Plantar and posterior calcaneal spurs are seen. A prominent os  peroneum is noted. There is a bipartite medial sesamoid of the first toe.  Diffuse vascular calcifications are seen.  IMPRESSION: 1. Vague lucency involving the distal aspect of the fifth proximal phalanx, raising question for underlying osteomyelitis, though this is only seen on a single image. MRI could be considered for further evaluation, as deemed clinically appropriate. 2. Diffuse vascular calcifications seen. 3. Prominent os peroneum noted. 4. Bipartite medial sesamoid of the first toe.   Electronically Signed   By: Garald Balding M.D.   On: 04/12/2015 00:42   Old chart reviewed Case discussed with edp dr Dina Rich cxr rewiewed nml  Assessment/Plan  64 yo male with diabetic wound/infection to right foot/5th  toe  Principal Problem:   Diabetic foot infection moderate to severe-  Order mri right foot to r/o underlying osteomyelitis.  Obtain wound consult.  Place on iv vanc/zosyn.  Pt high risk for amputation.  No overt drainage from the wound.  Consult surgery after mri results if needed.  Active Problems:   Essential hypertension- stable   CKD (chronic kidney disease)- not far from baseline 1.6- 1.8.     Type 2 diabetes mellitus with other circulatory complications-  Cont lantus, place on sensitive ssi   OSA (obstructive sleep apnea)- noted   S/P unilateral BKA (below knee amputation)  Admit to med surg.  Full code.    Merritt Mccravy A 04/12/2015, 2:48 AM

## 2015-04-13 DIAGNOSIS — M869 Osteomyelitis, unspecified: Secondary | ICD-10-CM

## 2015-04-13 DIAGNOSIS — I10 Essential (primary) hypertension: Secondary | ICD-10-CM

## 2015-04-13 DIAGNOSIS — E1159 Type 2 diabetes mellitus with other circulatory complications: Secondary | ICD-10-CM

## 2015-04-13 DIAGNOSIS — M9080 Osteopathy in diseases classified elsewhere, unspecified site: Secondary | ICD-10-CM

## 2015-04-13 LAB — GLUCOSE, CAPILLARY
GLUCOSE-CAPILLARY: 244 mg/dL — AB (ref 65–99)
GLUCOSE-CAPILLARY: 155 mg/dL — AB (ref 65–99)
Glucose-Capillary: 197 mg/dL — ABNORMAL HIGH (ref 65–99)
Glucose-Capillary: 301 mg/dL — ABNORMAL HIGH (ref 65–99)

## 2015-04-13 LAB — BASIC METABOLIC PANEL
Anion gap: 9 (ref 5–15)
BUN: 31 mg/dL — ABNORMAL HIGH (ref 6–20)
CALCIUM: 8 mg/dL — AB (ref 8.9–10.3)
CO2: 24 mmol/L (ref 22–32)
Chloride: 104 mmol/L (ref 101–111)
Creatinine, Ser: 1.96 mg/dL — ABNORMAL HIGH (ref 0.61–1.24)
GFR calc Af Amer: 40 mL/min — ABNORMAL LOW (ref >60)
GFR calc non Af Amer: 35 mL/min — ABNORMAL LOW (ref >60)
Glucose, Bld: 222 mg/dL — ABNORMAL HIGH (ref 65–99)
POTASSIUM: 4.2 mmol/L (ref 3.5–5.1)
SODIUM: 137 mmol/L (ref 135–145)

## 2015-04-13 LAB — CBC
HCT: 33.6 % — ABNORMAL LOW (ref 39.0–52.0)
Hemoglobin: 11.4 g/dL — ABNORMAL LOW (ref 13.0–17.0)
MCH: 29.9 pg (ref 26.0–34.0)
MCHC: 33.9 g/dL (ref 30.0–36.0)
MCV: 88.2 fL (ref 78.0–100.0)
Platelets: 259 10*3/uL (ref 150–400)
RBC: 3.81 MIL/uL — AB (ref 4.22–5.81)
RDW: 13.7 % (ref 11.5–15.5)
WBC: 13.7 10*3/uL — AB (ref 4.0–10.5)

## 2015-04-13 LAB — HEMOGLOBIN A1C
Hgb A1c MFr Bld: 7.1 % — ABNORMAL HIGH (ref 4.8–5.6)
MEAN PLASMA GLUCOSE: 157 mg/dL

## 2015-04-13 LAB — URINE CULTURE
Colony Count: NO GROWTH
Culture: NO GROWTH

## 2015-04-13 MED ORDER — GUAIFENESIN-DM 100-10 MG/5ML PO SYRP
5.0000 mL | ORAL_SOLUTION | ORAL | Status: DC | PRN
  Administered 2015-04-13 – 2015-04-15 (×4): 5 mL via ORAL
  Filled 2015-04-13 (×4): qty 5

## 2015-04-13 MED ORDER — INSULIN GLARGINE 100 UNIT/ML ~~LOC~~ SOLN
27.0000 [IU] | Freq: Every day | SUBCUTANEOUS | Status: DC
  Administered 2015-04-13: 27 [IU] via SUBCUTANEOUS
  Filled 2015-04-13: qty 0.27

## 2015-04-13 MED ORDER — INSULIN ASPART 100 UNIT/ML ~~LOC~~ SOLN
5.0000 [IU] | Freq: Three times a day (TID) | SUBCUTANEOUS | Status: DC
  Administered 2015-04-13: 5 [IU] via SUBCUTANEOUS

## 2015-04-13 NOTE — Progress Notes (Signed)
Subjective:     Patient reports pain as mild.    Objective: Vital signs in last 24 hours: Temp:  [99 F (37.2 C)-100.3 F (37.9 C)] 99.2 F (37.3 C) (06/14 0652) Pulse Rate:  [92-95] 94 (06/14 0652) Resp:  [16] 16 (06/14 0652) BP: (92-115)/(50-70) 92/50 mmHg (06/14 0652) SpO2:  [98 %-100 %] 100 % (06/14 0652) Weight:  [111.1 kg (244 lb 14.9 oz)-115.214 kg (254 lb)] 111.1 kg (244 lb 14.9 oz) (06/14 [id number])  Intake/Output from previous day: 06/13 0701 - 06/14 0700 In: 1967.5 [P.O.:240; I.V.:1327.5; IV Piggyback:400] Out: -  Intake/Output this shift:     Recent Labs  04/12/15 0023 04/13/15 0629  HGB 11.4* 11.4*    Recent Labs  04/12/15 0023 04/13/15 0629  WBC 13.6* 13.7*  RBC 3.77* 3.81*  HCT 33.2* 33.6*  PLT 250 259    Recent Labs  04/12/15 0023 04/13/15 0629  NA 133* 137  K 4.0 4.2  CL 100* 104  CO2 23 24  BUN 36* 31*  CREATININE 2.19* 1.96*  GLUCOSE 194* 222*  CALCIUM 8.3* 8.0*   No results for input(s): LABPT, INR in the last 72 hours.  Neurologically intact   Physical therapy removed the eschar yesterday and did initial pulse lavage debriedment.  His toe looks good today.  Viability is still present.  Therapy is in the room now for another treatment.  I will watch this daily.    Assessment/Plan:     Continue pulse lavage and IV antibiotics.  He may need a PICC line and antibiotics via home health for about a month depending on how the toe does and responds.  He will need outpatient PT pulse lavage and wound care for a while also.  It is still too early to determine if he will need amputation of the toe or if it will slowly heal on its own.  I have told the patient and his wife this fact.  We will take a day at a time for now.  [name] 04/13/2015, 8:53 AM

## 2015-04-13 NOTE — Clinical Documentation Improvement (Signed)
Patient presents with a Diabetic Foot Ulcer. Many manifestations can be assumed/linked to diabetes but Osteomyelitis is not one of them.  Please clarify if there is a cause and effect relationship between the patient's suspected Osteomyelitis of right distal aspect of proximal phalanx (MR of foot w/o Contrast) and his diabetes. If no relationship exists please document that as well.   _______Other Condition__________________ _______Cannot Clinically Determine   Thank You, Zoila Shutter ,RN Clinical Documentation Specialist:  [place] [employer]

## 2015-04-13 NOTE — Progress Notes (Signed)
[facility] PROGRESS NOTE  [name] [id number] DOB: [birth year] DOA: 04/11/2015 PCP: [name], MD  Assessment/Plan: 64 y/o male with PMH of HTN, Diastolic CHF, DM, h/o L AKA left lower extremity who presented with one-week history of worsening redness with swelling of the right fifth toe and the foot.  -MRI of the right foot shows cellulitis of the right fifth toe with concern for osteomyelitis  1. Right toe cellulitis with ulcer with osteomyelitis. MRI: osteomyelitis of the distal aspect of the proximal phalanx. -we will cont empiric IV vancomycin and Zosyn. Orthopedic surgery recommend pulse lavage of the little toe by PT and continue IV antibiotic. Ongoing evaluation for ? Amputation if no response to atx   2. Type 2 diabetes mellitus A1c-7.1. Previously around 5 per patient -will increase lantus to 27 Unit + ISS. If uncontrolled will add mealtime insulin  3. Acute on chronic kidney disease stage II Baseline creatinine around 1.8. Monitor on Iv vanc/zosyn 4. Essential hypertension. Stable cont home regimen  5. Peripheral vascular disease Continue Plavix and Pletal. Continue statin 6. Diastolic CHF. clinically euvolemic. Cont home regimen,. Lasix, BB. Not on ACE due to AKI   Code Status: full Family Communication: d/w patient, his wife  (indicate person spoken with, relationship, and if by phone, the number) Disposition Plan: pend clinical improvement    Consultants:  Ortho   Procedures:  none  Antibiotics:  IV vancomycin and Zosyn since 6/13>>>   (indicate start date, and stop date if known)  HPI/Subjective: Alert, oriented   Objective: Filed Vitals:   04/13/15 0851  BP: 119/72  Pulse: 89  Temp:   Resp:     Intake/Output Summary (Last 24 hours) at 04/13/15 0929 Last data filed at 04/13/15 0631  Gross per 24 hour  Intake 1967.5 ml  Output      0 ml  Net 1967.5 ml   Filed Weights   04/11/15 2225 04/12/15 0618 04/13/15 0652  Weight: 117.935  kg (260 lb) 115.5 kg (254 lb 10.1 oz) 111.1 kg (244 lb 14.9 oz)    Exam:   General:  No distress   Cardiovascular: s1,s2 rrr  Respiratory: CTA BL  Abdomen: soft, nt,nd no   Musculoskeletal: L AKA. R toe ulcer, with cellulitis    Data Reviewed: Basic Metabolic Panel:  Recent Labs Lab 04/12/15 0023 04/13/15 0629  NA 133* 137  K 4.0 4.2  CL 100* 104  CO2 23 24  GLUCOSE 194* 222*  BUN 36* 31*  CREATININE 2.19* 1.96*  CALCIUM 8.3* 8.0*   Liver Function Tests:  Recent Labs Lab 04/12/15 0023  AST 17  ALT 14*  ALKPHOS 65  BILITOT 0.9  PROT 6.7  ALBUMIN 2.6*   No results for input(s): LIPASE, AMYLASE in the last 168 hours. No results for input(s): AMMONIA in the last 168 hours. CBC:  Recent Labs Lab 04/12/15 0023 04/13/15 0629  WBC 13.6* 13.7*  NEUTROABS 10.2*  --   HGB 11.4* 11.4*  HCT 33.2* 33.6*  MCV 88.1 88.2  PLT 250 259   Cardiac Enzymes: No results for input(s): CKTOTAL, CKMB, CKMBINDEX, TROPONINI in the last 168 hours. BNP (last 3 results) No results for input(s): BNP in the last 8760 hours.  ProBNP (last 3 results) No results for input(s): PROBNP in the last 8760 hours.  CBG:  Recent Labs Lab 04/12/15 0745 04/12/15 1133 04/12/15 1614 04/12/15 2116 04/13/15 0755  GLUCAP 166* 184* 215* 198* 197*    Recent Results (from the past 240  hour(s))  Blood Culture (routine x 2)     Status: None (Preliminary result)   Collection Time: 04/12/15 12:23 AM  Result Value Ref Range Status   Specimen Description RIGHT ANTECUBITAL  Final   Special Requests BOTTLES DRAWN AEROBIC ONLY 8CC  Final   Culture NO GROWTH <24 HRS  Final   Report Status PENDING  Incomplete  Blood Culture (routine x 2)     Status: None (Preliminary result)   Collection Time: 04/12/15 12:37 AM  Result Value Ref Range Status   Specimen Description BLOOD RIGHT HAND  Final   Special Requests BOTTLES DRAWN AEROBIC ONLY 8CC  Final   Culture NO GROWTH <24 HRS  Final   Report  Status PENDING  Incomplete  Urine culture     Status: None   Collection Time: 04/12/15  2:24 AM  Result Value Ref Range Status   Specimen Description URINE, CLEAN CATCH  Final   Special Requests NONE  Final   Colony Count NO GROWTH Performed at [facility]   Final   Culture NO GROWTH Performed at [facility]   Final   Report Status 04/13/2015 FINAL  Final     Studies: Mr Foot Right Wo Contrast  04/12/2015   CLINICAL DATA:  Cellulitis and open wound on the distal little toe.  EXAM: MRI OF THE RIGHT FOREFOOT WITHOUT CONTRAST  TECHNIQUE: Multiplanar, multisequence MR imaging was performed. No intravenous contrast was administered.  COMPARISON:  Radiographs dated 04/12/2015  FINDINGS: There is a soft tissue ulceration at the lateral aspect of the PIP joint of the little toe. There is a nonunion fracture of the proximal shaft of the proximal phalanx and a subtle fracture of the distal proximal phalanx. There is poorly defined fluid in and around the PIP joint and extending proximally along the proximal phalanx with this mall effusion in the fifth metatarsal phalangeal joint.  There are moderate arthritic changes at the second and third tarsometatarsal joints and between the bases of the first and second and second and third and third and fourth metatarsals.  There is slight nonspecific dorsal subcutaneous soft tissue edema. No  Moderate arthritis of the first metatarsal phalangeal joint with hallux valgus deformity and bunion formation.  IMPRESSION: Fractures of the proximal and distal aspects of proximal phalanx of the little toe with an overlying soft tissue ulceration. Adjacent fluid in the soft tissues could represent infection. I suspect the patient has osteomyelitis of the distal aspect of the proximal phalanx.   Electronically Signed   By: Lorriane Shire M.D.   On: 04/12/2015 09:33   Dg Chest Portable 1 View  04/12/2015   CLINICAL DATA:  Right foot pain and swelling with  redness to the fifth digit for 2 weeks. Fever and cough for 1 week.  EXAM: PORTABLE CHEST - 1 VIEW  COMPARISON:  06/26/2014  FINDINGS: The heart size and mediastinal contours are within normal limits. Both lungs are clear. The visualized skeletal structures are unremarkable.  IMPRESSION: No active disease.   Electronically Signed   By: Lucienne Capers M.D.   On: 04/12/2015 02:46   Dg Foot Complete Right  04/12/2015   CLINICAL DATA:  Right fifth toe pain, with fever, swelling and erythema for 2 weeks. Initial encounter.  EXAM: RIGHT FOOT COMPLETE - 3+ VIEW  COMPARISON:  None.  FINDINGS: There is vague lucency involving the distal aspect of the fifth proximal phalanx, raising question for underlying osteomyelitis, though this is only seen on a single  image. Surrounding soft tissue swelling is noted at the fifth toe.  The joint spaces are preserved. There is no evidence of talar subluxation; the subtalar joint is unremarkable in appearance. Plantar and posterior calcaneal spurs are seen. A prominent os peroneum is noted. There is a bipartite medial sesamoid of the first toe.  Diffuse vascular calcifications are seen.  IMPRESSION: 1. Vague lucency involving the distal aspect of the fifth proximal phalanx, raising question for underlying osteomyelitis, though this is only seen on a single image. MRI could be considered for further evaluation, as deemed clinically appropriate. 2. Diffuse vascular calcifications seen. 3. Prominent os peroneum noted. 4. Bipartite medial sesamoid of the first toe.   Electronically Signed   By: Garald Balding M.D.   On: 04/12/2015 00:42    Scheduled Meds: . atenolol  25 mg Oral Daily  . cilostazol  100 mg Oral BID  . clopidogrel  75 mg Oral Daily  . furosemide  40 mg Oral Daily  . heparin subcutaneous  5,000 Units Subcutaneous 3 times per day  . insulin aspart  0-9 Units Subcutaneous TID WC  . insulin glargine  25 Units Subcutaneous QHS  . piperacillin-tazobactam (ZOSYN)  IV   3.375 g Intravenous Q8H  . rosuvastatin  20 mg Oral Daily  . sodium chloride  3 mL Intravenous Q12H  . vancomycin  1,250 mg Intravenous Q24H   Continuous Infusions: . sodium chloride 75 mL (04/12/15 1849)    Principal Problem:   Diabetic foot infection Active Problems:   Essential hypertension   CKD (chronic kidney disease)   Type 2 diabetes mellitus with other circulatory complications   OSA (obstructive sleep apnea)   S/P unilateral BKA (below knee amputation)   Pressure ulcer    Time spent: >35 minutes     [name]  [facility] Pager [phone]. If 7PM-7AM, please contact night-coverage at [email or web address], password [facility] 04/13/2015, 9:29 AM  LOS: 1 day

## 2015-04-13 NOTE — Progress Notes (Addendum)
Physical Therapy Wound Treatment Patient Details  Name: [name] MRN: [id number] Date of Birth: 04/28/1951  Today's Date: 04/13/2015 Time: 0841-0936 Time Calculation (min): 55 min  Subjective  Subjective: I can feel my foot a little bit today Patient and Family Stated Goals: prevent amputation of the toe and foot Date of Onset: 03/27/15 Prior Treatments: none  Pain Score: Pain Score: 0-No pain  Wound Assessment  Pressure Ulcer 04/11/15 Stage II -  Partial thickness loss of dermis presenting as a shallow open ulcer with a red, pink wound bed without slough. 3-4 mm round OTA with grey wound bed, with linear area of pulled skin on inside of toe with pink wound bed (Active)  Dressing Type Gauze (Comment);Other (Comment) 04/13/2015  9:46 AM  Dressing Dry;Changed 04/13/2015  9:46 AM  Dressing Change Frequency Daily 04/13/2015  9:46 AM  State of Healing Non-healing 04/13/2015  9:46 AM  Site / Wound Assessment Pink;Pale;Purple;Black 04/13/2015  9:46 AM  % Wound base Red or Granulating 15% 04/13/2015  9:46 AM  % Wound base Yellow 5% 04/13/2015  9:46 AM  % Wound base Black 30% 04/13/2015  9:46 AM  % Wound base Other (Comment) 50% 04/13/2015  9:46 AM  Peri-wound Assessment Intact;Edema;Purple;Other (Comment) 04/13/2015  9:46 AM  Wound Length (cm) 2 cm 04/12/2015  2:01 PM  Wound Width (cm) 2 cm 04/12/2015  2:01 PM  Undermining (cm) most of the necrotic tissue has now been debrided away and therefore there is no significant undermining 04/13/2015  9:46 AM  Margins Unattached edges (unapproximated) 04/13/2015  9:46 AM  Drainage Amount Scant 04/13/2015  9:46 AM  Drainage Description Serosanguineous;Odor 04/12/2015  8:00 PM  Treatment Debridement (Selective);Hydrotherapy (Pulse lavage);Other (Comment) 04/13/2015  9:46 AM     Incision 09/05/12 Eye Left (Active)     Incision 10/03/12 Eye Right (Active)   Hydrotherapy Pulsed lavage therapy - wound location: dorsilateral and medial aspects of the 5th toe, right  foot Pulsed Lavage with Suction - Normal Saline Used: 1000 mL Pulsed Lavage Tip: Tip with splash shield Selective Debridement Selective Debridement - Location: 5th toe, circumferential Selective Debridement - Tools Used: Forceps;Scissors Selective Debridement - Tissue Removed: necrotic tissue covering most of the 5th toe has now been removed   Wound Assessment and Plan  Wound Therapy - Assess/Plan/Recommendations Wound Therapy - Clinical Statement: The wound was inspected and there is now minimal drainage.  A significant amount of necrotic tissue was debrided so there is no more undermining.  Remaining tissue is pale pink in color.  The periwound tissue is very dusky/purple in color and an area of white skin was noted over the plantar surface of the 5th metatarsal head.  While discussing the pt's past medical hx, it was divulged that when his left leg was amputated 12 1/2 years ago, he was told that 2 out of three arteries in the right LE were "bad".  I have relayed this info the Dr. Buriev.  It was also noted that the pt is becoming quite weak while lying in the bed.  He is ambulating to the bathroom once a day with his walker and we need to minimize any weight bearing on the right foot so gait training is not appropriate but I did begin therapeutic exercise in the bed to try to minimize further deconditioning per approval of Dr. Buriev.  Pt tolerated exercise well and I will provide him with a home exercise program.                      Wound Therapy - Functional Problem List: Pt already has a left  AKA from a foot wound similar to the current one.  He is a diabetic and is obese. Factors Delaying/Impairing Wound Healing: Infection - systemic/local Hydrotherapy Plan: Debridement;Dressing change;Pulsatile lavage with suction;Patient/family education Wound Therapy - Frequency: 6X / week Wound Therapy - Current Recommendations: PT Wound Therapy - Follow Up Recommendations: Home health RN Wound Plan:  MD will be monitoring the condition of the toe and assess for need of amputation   Wound Therapy Goals- Improve the function of patient's integumentary system by progressing the wound(s) through the phases of wound healing (inflammation - proliferation - remodeling) by: Decrease Necrotic Tissue to: 20 Decrease Necrotic Tissue - Progress: Goal set today Increase Granulation Tissue to: 25% Increase Granulation Tissue - Progress: Progressing toward goal Time For Goal Achievement: 2 weeks Wound Therapy - Potential for Goals: Fair  Goals will be updated until maximal potential achieved or discharge criteria met.  Discharge criteria: when goals achieved, discharge from hospital, MD decision/surgical intervention, no progress towards goals, refusal/missing three consecutive treatments without notification or medical reason.  GP     Brown, Claudia L  PT 04/13/2015, 10:01 AM [phone]   

## 2015-04-14 ENCOUNTER — Inpatient Hospital Stay (HOSPITAL_COMMUNITY): Payer: BLUE CROSS/BLUE SHIELD

## 2015-04-14 DIAGNOSIS — E1169 Type 2 diabetes mellitus with other specified complication: Principal | ICD-10-CM

## 2015-04-14 DIAGNOSIS — L089 Local infection of the skin and subcutaneous tissue, unspecified: Secondary | ICD-10-CM

## 2015-04-14 LAB — GLUCOSE, CAPILLARY
GLUCOSE-CAPILLARY: 211 mg/dL — AB (ref 65–99)
GLUCOSE-CAPILLARY: 160 mg/dL — AB (ref 65–99)
Glucose-Capillary: 153 mg/dL — ABNORMAL HIGH (ref 65–99)
Glucose-Capillary: 212 mg/dL — ABNORMAL HIGH (ref 65–99)

## 2015-04-14 LAB — BASIC METABOLIC PANEL
Anion gap: 8 (ref 5–15)
BUN: 27 mg/dL — ABNORMAL HIGH (ref 6–20)
CALCIUM: 7.8 mg/dL — AB (ref 8.9–10.3)
CHLORIDE: 106 mmol/L (ref 101–111)
CO2: 24 mmol/L (ref 22–32)
Creatinine, Ser: 2.01 mg/dL — ABNORMAL HIGH (ref 0.61–1.24)
GFR calc Af Amer: 39 mL/min — ABNORMAL LOW (ref >60)
GFR calc non Af Amer: 34 mL/min — ABNORMAL LOW (ref >60)
Glucose, Bld: 155 mg/dL — ABNORMAL HIGH (ref 65–99)
POTASSIUM: 3.7 mmol/L (ref 3.5–5.1)
Sodium: 138 mmol/L (ref 135–145)

## 2015-04-14 MED ORDER — INSULIN GLARGINE 100 UNIT/ML ~~LOC~~ SOLN
34.0000 [IU] | Freq: Every day | SUBCUTANEOUS | Status: DC
  Administered 2015-04-14: 34 [IU] via SUBCUTANEOUS
  Filled 2015-04-14 (×2): qty 0.34

## 2015-04-14 MED ORDER — INSULIN ASPART 100 UNIT/ML ~~LOC~~ SOLN
6.0000 [IU] | Freq: Three times a day (TID) | SUBCUTANEOUS | Status: DC
  Administered 2015-04-14 (×2): 6 [IU] via SUBCUTANEOUS

## 2015-04-14 NOTE — Progress Notes (Signed)
Physical Therapy Treatment Patient Details Name: [name] MRN: [id number] DOB: 04/04/1951 Today's Date: 04-16-2015    History of Present Illness      PT Comments    Pt tolerated supine exercise well in the bed.  He is quite familiar with the exercises from previous therapy sessions.  I am encouraging him to be out of the bed and in the recliner every day and the CNA will make sure that happens today.  He is ambulating to the bathroom daily with his walker.  Follow Up Recommendations        Equipment Recommendations    none   Recommendations for Other Services       Precautions / Restrictions Restrictions Weight Bearing Restrictions: Yes LLE Weight Bearing: Non weight bearing    Mobility  Bed Mobility    independent              Transfers  independent                  Ambulation/Gait    independent             Stairs            Wheelchair Mobility    Modified Rankin (Stroke Patients Only)       Balance                                    Cognition                            Exercises General Exercises - Lower Extremity Ankle Circles/Pumps: AROM;Right;20 reps;Supine Quad Sets: AROM;Right;20 reps;Supine Gluteal Sets: AROM;Right;10 reps;Supine Short Arc Quad: AROM;Right;10 reps;Supine Heel Slides: AROM;Right;10 reps;Supine Hip ABduction/ADduction: AROM;Right;10 reps;Supine Straight Leg Raises: AROM;Right;10 reps;Supine    General Comments        Pertinent Vitals/Pain      Home Living                      Prior Function            PT Goals (current goals can now be found in the care plan section)      Frequency       PT Plan      Co-evaluation             End of Session           Time:  -     Charges:  $Therapeutic Exercise: 8-22 mins                    G Codes:      [name] 04/16/15, 10:53 AM

## 2015-04-14 NOTE — Progress Notes (Signed)
Physical Therapy Wound Treatment Patient Details  Name: [name] MRN: [id number] Date of Birth: [birth year]  Today's Date: 04/14/2015 Time: 0920-1001 Time Calculation (min): 41 min  Subjective  Subjective: my foot hurt a little on the top earlier today  Pain Score:    Wound Assessment  Pressure Ulcer 04/11/15 Stage II -  Partial thickness loss of dermis presenting as a shallow open ulcer with a red, pink wound bed without slough. 3-4 mm round OTA with grey wound bed, with linear area of pulled skin on inside of toe with pink wound bed (Active)  Dressing Type Gauze (Comment) 04/14/2015 10:35 AM  Dressing Changed;Clean;Dry;Intact 04/14/2015 10:35 AM  Dressing Change Frequency Daily 04/14/2015 10:35 AM  State of Healing Non-healing 04/14/2015 10:35 AM  Site / Wound Assessment Black;Pink;Red;Yellow 04/14/2015 10:35 AM  % Wound base Red or Granulating 20% 04/14/2015 10:35 AM  % Wound base Yellow 5% 04/14/2015 10:35 AM  % Wound base Black 40% 04/14/2015 10:35 AM  % Wound base Other (Comment) 35% 04/14/2015 10:35 AM  Peri-wound Assessment Intact;Purple;Edema 04/14/2015 10:35 AM  Wound Length (cm) 2 cm 04/12/2015  2:01 PM  Wound Width (cm) 2 cm 04/12/2015  2:01 PM  Undermining (cm) most of the necrotic tissue has now been debrided away and therefore there is no significant undermining 04/13/2015  9:46 AM  Margins Unattached edges (unapproximated) 04/14/2015 10:35 AM  Drainage Amount Scant 04/14/2015 10:35 AM  Drainage Description Odor;Serosanguineous 04/14/2015 10:35 AM  Treatment Debridement (Selective);Hydrotherapy (Pulse lavage);Other (Comment) 04/14/2015 10:35 AM     Incision 09/05/12 Eye Left (Active)     Incision 10/03/12 Eye Right (Active)   Hydrotherapy Pulsed lavage therapy - wound location: entire surface of 5th toe Pulsed Lavage with Suction - Normal Saline Used: 1000 mL Pulsed Lavage Tip: Tip with splash shield Selective Debridement Selective Debridement - Location: dead epidermis at  medial surface of toe Selective Debridement - Tools Used: Forceps;Scissors Selective Debridement - Tissue Removed: more black tissue now covers the dorsal surface of the toe.Marland KitchenMarland KitchenIt is firmly adhered   Wound Assessment and Plan  Wound Therapy - Assess/Plan/Recommendations Wound Therapy - Clinical Statement: The wound continues to have an odor.  The entire plantar surface of the toe is now quite black in color although at the base of the dorsal/lateral surface there is some granulation tissue present.  At the dorsal distal portion of the toe  there is black necrotic tissue reformed which is firmly adhered.  There is clear erythema over the entire dorsum of the foot.  It was also noted that pt has a large pressure area over the calcaneus. Wound Therapy - Functional Problem List: Pt already has a left  AKA from a foot wound similar to the current one.  He is a diabetic and is obese.  He has poor to no sensation of the right foot Factors Delaying/Impairing Wound Healing: Infection - systemic/local;Altered sensation;Diabetes Mellitus Hydrotherapy Plan: Debridement;Dressing change;Pulsatile lavage with suction;Patient/family education Wound Therapy - Frequency: 6X / week Wound Therapy - Current Recommendations: PT Wound Therapy - Follow Up Recommendations: Home health RN Wound Plan: MD will be monitoring the condition of the toe and assess for need of amputation   Wound Therapy Goals- Improve the function of patient's integumentary system by progressing the wound(s) through the phases of wound healing (inflammation - proliferation - remodeling) by: Decrease Necrotic Tissue - Progress: Not progressing Increase Granulation Tissue - Progress: Progressing toward goal Wound Therapy - Potential for Goals: Poor  Goals will be updated until  maximal potential achieved or discharge criteria met.  Discharge criteria: when goals achieved, discharge from hospital, MD decision/surgical intervention, no progress  towards goals, refusal/missing three consecutive treatments without notification or medical reason.  GP     [name] 04/14/2015, 10:49 AM

## 2015-04-14 NOTE — Progress Notes (Signed)
[facility] PROGRESS NOTE  [name] [id number] DOB: Mar 06, 1951 DOA: 04/11/2015 PCP: [name], MD  Assessment/Plan: Right fifth toe cellulitis with osteomyelitis -MRI has confirmed presence of osteomyelitis of the distal aspect of the proximal phalanx. -Continue empiric IV antibiotics, pulse lavage as directed by orthopedic surgery -I suspect patient has some significant vascular disease and if such have ordered ABIs, doubt that this wound will heal without amputation.  Type 2 diabetes mellitus -Dr. Dorris Fetch is involved and adjusting patient's insulin regimen. -CBGs remain uncontrolled although improved since admission.  Chronic diastolic CHF -Currently compensated.  Hypertension -Stable, continue home regimen.  Chronic kidney disease stage 3-4 -Creatinine remains at baseline of 1.9-2.2.    Code Status: Full code Family Communication: Patient's pastor and church friends updated at bedside per patient request  Disposition Plan: Home when ready   Consultants:  Endocrinology, Dr. Dorris Fetch  Orthopedics, Dr. Luna Glasgow   Antibiotics:  Vancomycin  Zosyn   Subjective: No chest pain, shortness of breath, complains of a mild headache  Objective: Filed Vitals:   04/14/15 0600 04/14/15 0904 04/14/15 1441 04/14/15 1728  BP: 106/70 110/66 132/79   Pulse: 80 86 91   Temp: 98.6 F (37 C)  99.4 F (37.4 C) 101 F (38.3 C)  TempSrc: Oral  Axillary Oral  Resp: 17  18   Height:      Weight: 109.9 kg (242 lb 4.6 oz)     SpO2: 96%  100%     Intake/Output Summary (Last 24 hours) at 04/14/15 1953 Last data filed at 04/14/15 1855  Gross per 24 hour  Intake    593 ml  Output    375 ml  Net    218 ml   Filed Weights   04/12/15 0618 04/13/15 0652 04/14/15 0600  Weight: 115.5 kg (254 lb 10.1 oz) 111.1 kg (244 lb 14.9 oz) 109.9 kg (242 lb 4.6 oz)    Exam:   General:  Alert, awake, oriented 3, no current distress  Cardiovascular: Regular rate and  rhythm  Respiratory: Clear to auscultation bilaterally  Abdomen: Soft, nontender, nondistended, positive bowel sounds  Extremities: Right fifth toe gangrenous with white color changes of the sole of his right foot   Neurologic:  Nonfocal  Data Reviewed: Basic Metabolic Panel:  Recent Labs Lab 04/12/15 0023 04/13/15 0629 04/14/15 0629  NA 133* 137 138  K 4.0 4.2 3.7  CL 100* 104 106  CO2 23 24 24   GLUCOSE 194* 222* 155*  BUN 36* 31* 27*  CREATININE 2.19* 1.96* 2.01*  CALCIUM 8.3* 8.0* 7.8*   Liver Function Tests:  Recent Labs Lab 04/12/15 0023  AST 17  ALT 14*  ALKPHOS 65  BILITOT 0.9  PROT 6.7  ALBUMIN 2.6*   No results for input(s): LIPASE, AMYLASE in the last 168 hours. No results for input(s): AMMONIA in the last 168 hours. CBC:  Recent Labs Lab 04/12/15 0023 04/13/15 0629  WBC 13.6* 13.7*  NEUTROABS 10.2*  --   HGB 11.4* 11.4*  HCT 33.2* 33.6*  MCV 88.1 88.2  PLT 250 259   Cardiac Enzymes: No results for input(s): CKTOTAL, CKMB, CKMBINDEX, TROPONINI in the last 168 hours. BNP (last 3 results) No results for input(s): BNP in the last 8760 hours.  ProBNP (last 3 results) No results for input(s): PROBNP in the last 8760 hours.  CBG:  Recent Labs Lab 04/13/15 1700 04/13/15 2050 04/14/15 0738 04/14/15 1115 04/14/15 1651  GLUCAP 301* 155* 153* 211* 160*  Recent Results (from the past 240 hour(s))  Blood Culture (routine x 2)     Status: None (Preliminary result)   Collection Time: 04/12/15 12:23 AM  Result Value Ref Range Status   Specimen Description RIGHT ANTECUBITAL  Final   Special Requests BOTTLES DRAWN AEROBIC ONLY 8CC  Final   Culture NO GROWTH 2 DAYS  Final   Report Status PENDING  Incomplete  Blood Culture (routine x 2)     Status: None (Preliminary result)   Collection Time: 04/12/15 12:37 AM  Result Value Ref Range Status   Specimen Description BLOOD RIGHT HAND  Final   Special Requests BOTTLES DRAWN AEROBIC ONLY 8CC   Final   Culture NO GROWTH 2 DAYS  Final   Report Status PENDING  Incomplete  Urine culture     Status: None   Collection Time: 04/12/15  2:24 AM  Result Value Ref Range Status   Specimen Description URINE, CLEAN CATCH  Final   Special Requests NONE  Final   Colony Count NO GROWTH Performed at [facility]   Final   Culture NO GROWTH Performed at [facility]   Final   Report Status 04/13/2015 FINAL  Final     Studies: US Arterial Seg Multiple  04/14/2015   CLINICAL DATA:  Left below-knee amputation. Right foot cellulitis and gangrene fifth digit. Fifth toe ulceration. Hypertension, diabetes.  EXAM: NONINVASIVE PHYSIOLOGIC VASCULAR STUDY OF BILATERAL LOWER EXTREMITIES  TECHNIQUE: Evaluation of both lower extremities was performed at rest, including calculation of ankle-brachial indices, multiple segmental pressure evaluation, segmental Doppler and segmental pulse volume recording.  COMPARISON:  02/28/2003  FINDINGS: Right ABI:  Non calculable due to vascular noncompressibility  Left ABI:  (below-knee amputation)  Right Lower Extremity: Diffuse vascular noncompressibility probably secondary to medial calcification. Biphasic waveforms through the popliteal level, monophasic waveforms distally.  Left Lower Extremity: Biphasic waveform through the SFA. Below-knee amputation.  IMPRESSION: 1. Right lower extremity arterial occlusive disease of at least mild severity, with a significant infrapopliteal component. Evaluation limited secondary to vascular noncompressibility. Should the patient fail conservative treatment, consider CTA runoff (higher spatial resolution) or MRA runoff (no radiation risk, can be performed noncontrast in the setting of renal dysfunction) to better define the site and nature of arterial occlusive disease and delineate treatment options.   Electronically Signed   By: Lucrezia Europe M.D.   On: 04/14/2015 14:16    Scheduled Meds: . atenolol  25 mg Oral Daily  .  cilostazol  100 mg Oral BID  . clopidogrel  75 mg Oral Daily  . furosemide  40 mg Oral Daily  . heparin subcutaneous  5,000 Units Subcutaneous 3 times per day  . insulin aspart  0-9 Units Subcutaneous TID WC  . insulin aspart  6 Units Subcutaneous TID WC  . insulin glargine  34 Units Subcutaneous QHS  . piperacillin-tazobactam (ZOSYN)  IV  3.375 g Intravenous Q8H  . rosuvastatin  20 mg Oral Daily  . sodium chloride  3 mL Intravenous Q12H  . vancomycin  1,250 mg Intravenous Q24H   Continuous Infusions:   Principal Problem:   Diabetic foot infection Active Problems:   Essential hypertension   CKD (chronic kidney disease)   Type 2 diabetes mellitus with other circulatory complications   OSA (obstructive sleep apnea)   S/P unilateral BKA (below knee amputation)   Pressure ulcer    Time spent: 35 minutes. Greater than 50% of this time was spent in direct contact with the patient  coordinating care.    Lelon Frohlich  [facility] Pager [phone]  If 7PM-7AM, please contact night-coverage at [email or web address], password [facility] 04/14/2015, 7:53 PM  LOS: 2 days

## 2015-04-14 NOTE — Care Management Note (Signed)
Case Management Note  Patient Details  Name: [name] MRN: [id number] Date of Birth: [birth year]  Subjective/Objective:                    Action/Plan:   Expected Discharge Date:  04/14/15               Expected Discharge Plan:  [place]  In-House Referral:  NA  Discharge planning Services  CM Consult  Post Acute Care Choice:  Home Health Choice offered to:  Spouse  DME Arranged:    DME Agency:     HH Arranged:  RN, IV Antibiotics HH Agency:  [facility]  Status of Service:  In process, will continue to follow  Medicare Important Message Given:    Date Medicare IM Given:    Medicare IM give by:    Date Additional Medicare IM Given:    Additional Medicare Important Message give by:     If discussed at [place] of Stay Meetings, dates discussed:    Additional Comments: CM discussed HH RN an dIV AB with pts wife. She agreed to [facility] and going home with IV AB. Will continue to follow. Christinia Gully Hiller, RN 04/14/2015, 11:43 AM

## 2015-04-14 NOTE — Consult Note (Signed)
[name] MRN: [id number] DOB/AGE: [birth year] 63 y.o. Primary Care Physician:MOORE, DONALD, MD Admit date: 04/11/2015 Chief Complaint: Consult for diabetes care. HPI: 64 yr old male with multiple medical problems including type 2 DM x 23 years, HTN, HPL, CAD, PAD, CKD, retinopathy, PAD with Left AKA.  He is familiar to me from out patient consult and follow up. He is on long term basal/bolus insulin using lantus and Novolog. His last visit with me was on January 20, 2015 when his a1c was 5.7%. He is in house now due to diabetic foot ulcer on right foot. His a1c is repeated and found to be 7.1% , higher than before. He resumed on his basal/bolus insulin and iv antibiotics involving zosyn. He is feeling better and eating on regular basis.  he denies any recent hypoglycemia. he follows up with cardiology and nephrology regularly. he is sedentary, uses Crutches to get around.   Past Medical History  Diagnosis Date  . Hypertension   . Hyperlipidemia   . Type 2 Diabetes mellitus   . Necrosis     #2 nail   . CKD (chronic kidney disease) 06/28/2014    Past Surgical History  Procedure Laterality Date  . Above knee leg amputation  2004  . Cataract extraction w/phaco  09/05/2012    Procedure: CATARACT EXTRACTION PHACO AND INTRAOCULAR LENS PLACEMENT (IOC);  Surgeon: Tonny Branch, MD;  Location: [facility];  Service: Ophthalmology;  Laterality: Left;  CDE=5.45  . Cataract extraction w/phaco  10/03/2012    Procedure: CATARACT EXTRACTION PHACO AND INTRAOCULAR LENS PLACEMENT (IOC);  Surgeon: Tonny Branch, MD;  Location: [facility];  Service: Ophthalmology;  Laterality: Right;  CDE: 12.31    Family History  Problem Relation Age of Onset  . Diabetes Mother   . Hypertension Mother   . Cancer Father   . Cancer Sister   . Sickle cell anemia Daughter     Social History:  reports that he has never smoked. He has never used smokeless tobacco. He reports that he does not drink alcohol or use illicit  drugs.   Allergies:  Allergies  Allergen Reactions  . Zolpidem Tartrate Other (See Comments)    disorientation     Medications Prior to Admission  Medication Sig Dispense Refill  . atenolol (TENORMIN) 25 MG tablet Take 1 tablet (25 mg total) by mouth daily. 30 tablet 3  . BYETTA 10 MCG PEN 10 MCG/0.04ML SOPN injection Inject 10 mcg into the skin 2 (two) times daily.  0  . Calcium Carb-Cholecalciferol (CALCIUM PLUS VITAMIN D3) 600-500 MG-UNIT CAPS Take 1 capsule by mouth daily.     . cilostazol (PLETAL) 100 MG tablet Take 1 tablet (100 mg total) by mouth 2 (two) times daily. 60 tablet 3  . clopidogrel (PLAVIX) 75 MG tablet Take 1 tablet (75 mg total) by mouth daily. 30 tablet 3  . furosemide (LASIX) 40 MG tablet Take 1 tablet (40 mg total) by mouth daily. 30 tablet 3  . insulin aspart (NOVOLOG) 100 UNIT/ML injection Inject 3 Units into the skin 3 (three) times daily as needed for high blood sugar. Only take if blood sugar is over 150. Take 3 units if blood sugar is between 150-160; 4 units=161-170; 5 units= 171-180; 6 units 181-190; 7 units = 191-200. Call MD if >200.    Marland Kitchen LANTUS SOLOSTAR 100 UNIT/ML Solostar Pen Inject 25 Units into the skin at bedtime.  2  . rosuvastatin (CRESTOR) 20 MG tablet Take 1 tablet (20 mg total) by  mouth daily. 30 tablet 3  . acetaminophen (TYLENOL) 500 MG tablet Take 500-1,500 mg by mouth every 6 (six) hours as needed for mild pain or moderate pain.         [id number] from the symptoms mentioned above,there are no other symptoms referable to all systems reviewed.  Physical Exam: Blood pressure 106/70, pulse 80, temperature 98.6 F (37 C), temperature source Oral, resp. rate 17, height 6' (1.829 m), weight 109.9 kg (242 lb 4.6 oz), SpO2 96 %.  GEN: no acute distress. HEENT: has   Dentures , no pallor. Neck: No JVD, no goiter. Chest: Clear to auscultation. CVS: distant heart sounds, no MRG Abd: no tenderness, soft. Ext: dressed right foot ulcer, no  discharge. Left AKA CNS: non focal.   Recent Labs  04/12/15 0023 04/13/15 0629  WBC 13.6* 13.7*  NEUTROABS 10.2*  --   HGB 11.4* 11.4*  HCT 33.2* 33.6*  MCV 88.1 88.2  PLT 250 259    Recent Labs  04/13/15 0629 04/14/15 0629  NA 137 138  K 4.2 3.7  CL 104 106  CO2 24 24  GLUCOSE 222* 155*  BUN 31* 27*  CREATININE 1.96* 2.01*  CALCIUM 8.0* 7.8*    Recent Labs  04/12/15 0023  AST 17  ALT 14*  ALKPHOS 65  BILITOT 0.9  PROT 6.7  ALBUMIN 2.6*   A1c =  7.1%  Recent Results (from the past 240 hour(s))  Blood Culture (routine x 2)     Status: None (Preliminary result)   Collection Time: 04/12/15 12:23 AM  Result Value Ref Range Status   Specimen Description RIGHT ANTECUBITAL  Final   Special Requests BOTTLES DRAWN AEROBIC ONLY 8CC  Final   Culture NO GROWTH 1 DAY  Final   Report Status PENDING  Incomplete  Blood Culture (routine x 2)     Status: None (Preliminary result)   Collection Time: 04/12/15 12:37 AM  Result Value Ref Range Status   Specimen Description BLOOD RIGHT HAND  Final   Special Requests BOTTLES DRAWN AEROBIC ONLY 8CC  Final   Culture NO GROWTH 1 DAY  Final   Report Status PENDING  Incomplete  Urine culture     Status: None   Collection Time: 04/12/15  2:24 AM  Result Value Ref Range Status   Specimen Description URINE, CLEAN CATCH  Final   Special Requests NONE  Final   Colony Count NO GROWTH Performed at [facility]   Final   Culture NO GROWTH Performed at [facility]   Final   Report Status 04/13/2015 FINAL  Final    Mr Foot Right Wo Contrast  04/12/2015   CLINICAL DATA:  Cellulitis and open wound on the distal little toe.  EXAM: MRI OF THE RIGHT FOREFOOT WITHOUT CONTRAST  TECHNIQUE: Multiplanar, multisequence MR imaging was performed. No intravenous contrast was administered.  COMPARISON:  Radiographs dated 04/12/2015  FINDINGS: There is a soft tissue ulceration at the lateral aspect of the PIP joint of the little toe.  There is a nonunion fracture of the proximal shaft of the proximal phalanx and a subtle fracture of the distal proximal phalanx. There is poorly defined fluid in and around the PIP joint and extending proximally along the proximal phalanx with this mall effusion in the fifth metatarsal phalangeal joint.  There are moderate arthritic changes at the second and third tarsometatarsal joints and between the bases of the first and second and second and third and third and fourth metatarsals.  There is slight nonspecific dorsal subcutaneous soft tissue edema. No  Moderate arthritis of the first metatarsal phalangeal joint with hallux valgus deformity and bunion formation.  IMPRESSION: Fractures of the proximal and distal aspects of proximal phalanx of the little toe with an overlying soft tissue ulceration. Adjacent fluid in the soft tissues could represent infection. I suspect the patient has osteomyelitis of the distal aspect of the proximal phalanx.   Electronically Signed   By: Lorriane Shire M.D.   On: 04/12/2015 09:33   Dg Chest Portable 1 View  04/12/2015   CLINICAL DATA:  Right foot pain and swelling with redness to the fifth digit for 2 weeks. Fever and cough for 1 week.  EXAM: PORTABLE CHEST - 1 VIEW  COMPARISON:  06/26/2014  FINDINGS: The heart size and mediastinal contours are within normal limits. Both lungs are clear. The visualized skeletal structures are unremarkable.  IMPRESSION: No active disease.   Electronically Signed   By: Lucienne Capers M.D.   On: 04/12/2015 02:46   Dg Foot Complete Right  04/12/2015   CLINICAL DATA:  Right fifth toe pain, with fever, swelling and erythema for 2 weeks. Initial encounter.  EXAM: RIGHT FOOT COMPLETE - 3+ VIEW  COMPARISON:  None.  FINDINGS: There is vague lucency involving the distal aspect of the fifth proximal phalanx, raising question for underlying osteomyelitis, though this is only seen on a single image. Surrounding soft tissue swelling is noted at the  fifth toe.  The joint spaces are preserved. There is no evidence of talar subluxation; the subtalar joint is unremarkable in appearance. Plantar and posterior calcaneal spurs are seen. A prominent os peroneum is noted. There is a bipartite medial sesamoid of the first toe.  Diffuse vascular calcifications are seen.  IMPRESSION: 1. Vague lucency involving the distal aspect of the fifth proximal phalanx, raising question for underlying osteomyelitis, though this is only seen on a single image. MRI could be considered for further evaluation, as deemed clinically appropriate. 2. Diffuse vascular calcifications seen. 3. Prominent os peroneum noted. 4. Bipartite medial sesamoid of the first toe.   Electronically Signed   By: Garald Balding M.D.   On: 04/12/2015 00:42     Impression:  Principal Problem:   Diabetic foot infection Active Problems:   Essential hypertension   CKD (chronic kidney disease)   Type 2 diabetes mellitus with other circulatory complications   OSA (obstructive sleep apnea)   S/P unilateral BKA (below knee amputation)   Pressure ulcer   Plan: His recent a1c is 7.1%, increased from prior a1c of 5.7% in March. In house BG readings are getting better after his insulin was readjusted yesterday. He will tolerate higher dose of basal insulin. I will increase lantus to 34 units qhs, increase Novolog to 6 units TIDAC plus low dose Novolog correction. Continue monitoring BG AC and HS. I will reevaluate in AM for insulin dose adjustment. Glycemic control to target is required in the face of infected diabetic foot ulcer.  Crysta Gulick, MD 04/14/2015, 8:10 AM

## 2015-04-15 LAB — GLUCOSE, CAPILLARY
GLUCOSE-CAPILLARY: 194 mg/dL — AB (ref 65–99)
Glucose-Capillary: 173 mg/dL — ABNORMAL HIGH (ref 65–99)

## 2015-04-15 LAB — BASIC METABOLIC PANEL
ANION GAP: 10 (ref 5–15)
BUN: 27 mg/dL — ABNORMAL HIGH (ref 6–20)
CO2: 25 mmol/L (ref 22–32)
Calcium: 8 mg/dL — ABNORMAL LOW (ref 8.9–10.3)
Chloride: 104 mmol/L (ref 101–111)
Creatinine, Ser: 1.95 mg/dL — ABNORMAL HIGH (ref 0.61–1.24)
GFR calc Af Amer: 41 mL/min — ABNORMAL LOW (ref >60)
GFR calc non Af Amer: 35 mL/min — ABNORMAL LOW (ref >60)
Glucose, Bld: 163 mg/dL — ABNORMAL HIGH (ref 65–99)
Potassium: 3.5 mmol/L (ref 3.5–5.1)
Sodium: 139 mmol/L (ref 135–145)

## 2015-04-15 LAB — CBC
HCT: 30.6 % — ABNORMAL LOW (ref 39.0–52.0)
HEMOGLOBIN: 10.3 g/dL — AB (ref 13.0–17.0)
MCH: 29.7 pg (ref 26.0–34.0)
MCHC: 33.7 g/dL (ref 30.0–36.0)
MCV: 88.2 fL (ref 78.0–100.0)
PLATELETS: 275 10*3/uL (ref 150–400)
RBC: 3.47 MIL/uL — AB (ref 4.22–5.81)
RDW: 13.9 % (ref 11.5–15.5)
WBC: 13.6 10*3/uL — ABNORMAL HIGH (ref 4.0–10.5)

## 2015-04-15 MED ORDER — AMOXICILLIN-POT CLAVULANATE 875-125 MG PO TABS
1.0000 | ORAL_TABLET | Freq: Two times a day (BID) | ORAL | Status: DC
  Administered 2015-04-15: 1 via ORAL
  Filled 2015-04-15: qty 1

## 2015-04-15 MED ORDER — LANTUS SOLOSTAR 100 UNIT/ML ~~LOC~~ SOPN: 34.0000 [IU] | PEN_INJECTOR | Freq: Every day | SUBCUTANEOUS | Status: DC

## 2015-04-15 MED ORDER — AMOXICILLIN-POT CLAVULANATE 875-125 MG PO TABS: 1.0000 | ORAL_TABLET | Freq: Two times a day (BID) | ORAL | Status: DC

## 2015-04-15 MED ORDER — INSULIN ASPART 100 UNIT/ML ~~LOC~~ SOLN
10.0000 [IU] | Freq: Three times a day (TID) | SUBCUTANEOUS | Status: DC
  Administered 2015-04-15: 10 [IU] via SUBCUTANEOUS

## 2015-04-15 NOTE — Progress Notes (Signed)
UR chart review completed.  

## 2015-04-15 NOTE — Care Management Note (Signed)
Case Management Note  Patient Details  Name: [name] MRN: [id number] Date of Birth: 04/13/1951  Subjective/Objective:                    Action/Plan:   Expected Discharge Date:  04/15/15               Expected Discharge Plan:  [place]  In-House Referral:  NA  Discharge planning Services  CM Consult  Post Acute Care Choice:  Home Health Choice offered to:  Spouse  DME Arranged:    DME Agency:     HH Arranged:  RN [place] Agency:  [place]  Status of Service:  Completed, signed off  Medicare Important Message Given:    Date Medicare IM Given:    Medicare IM give by:    Date Additional Medicare IM Given:    Additional Medicare Important Message give by:     If discussed at [place] of Stay Meetings, dates discussed:    Additional Comments: Pt discharged home today with po AB and [facility] RN for wound care (per pts choice). Romualdo Bolk of [facility] is aware and will collect the pts information from the chart. [facility] services to start within 48 hours of discharge. Pt information faxed to [facility] in [place] as pt has requested follow up at the clinic and Dr. Nils Pyle. They stated that they will not be able to see pt until the end of June. Per pts attending MD this will be ok with home health following pt until pt can be seen in the wound care clinic. Pt and pts nurse aware of discharge arrangements. Christinia Gully Pink, RN 04/15/2015, 12:43 PM

## 2015-04-15 NOTE — Progress Notes (Signed)
Discharge instructions reviewed and discussed with patient's wife. Follow up appointments and medications reviewed and discussed with patient's wife. All questions were answered and no further questions at this time. Pt is stable condition and in no acute distress at this time. Pt escorted by nurse tech.

## 2015-04-15 NOTE — Progress Notes (Signed)
Called to room, patient coughing non-productive.  Robitussin given.

## 2015-04-15 NOTE — Progress Notes (Signed)
Physical Therapy Wound Treatment Patient Details  Name: [name] MRN: [id number] Date of Birth: 22-Nov-1950  Today's Date: 04/15/2015 Time: 1030-1102 Time Calculation (min): 32 min  Subjective  Subjective: feels good Patient and Family Stated Goals: prevent amputation of the toe and foot Date of Onset: 03/27/15 Prior Treatments: none  Pain Score:    Wound Assessment  Pressure Ulcer 04/11/15 Stage II -  Partial thickness loss of dermis presenting as a shallow open ulcer with a red, pink wound bed without slough. 3-4 mm round OTA with grey wound bed, with linear area of pulled skin on inside of toe with pink wound bed (Active)  Dressing Type Gauze (Comment) 04/15/2015 11:24 AM  Dressing Clean;Dry;Intact 04/15/2015 11:24 AM  Dressing Change Frequency Daily 04/15/2015 11:24 AM  State of Healing Non-healing 04/15/2015 11:24 AM  Site / Wound Assessment Black;Pink;Red;Yellow 04/15/2015 11:24 AM  % Wound base Red or Granulating 20% 04/15/2015 11:24 AM  % Wound base Yellow 10% 04/15/2015 11:24 AM  % Wound base Black 70% 04/15/2015 11:24 AM  % Wound base Other (Comment) 35% 04/14/2015 10:35 AM  Peri-wound Assessment Intact;Edema;Purple 04/15/2015 11:24 AM  Wound Length (cm) 2 cm 04/12/2015  2:01 PM  Wound Width (cm) 2 cm 04/12/2015  2:01 PM  Undermining (cm) most of the necrotic tissue has now been debrided away and therefore there is no significant undermining 04/13/2015  9:46 AM  Margins Unattached edges (unapproximated) 04/15/2015 11:24 AM  Drainage Amount Scant 04/15/2015 11:24 AM  Drainage Description Odor;Serosanguineous 04/15/2015 11:24 AM  Treatment Debridement (Selective);Hydrotherapy (Pulse lavage) 04/15/2015 11:24 AM     Incision 09/05/12 Eye Left (Active)     Incision 10/03/12 Eye Right (Active)   Hydrotherapy Pulsed lavage therapy - wound location: entire surface of 5th toe Pulsed Lavage with Suction - Normal Saline Used: 1000 mL Pulsed Lavage Tip: Tip with splash shield Selective  Debridement Selective Debridement - Location: dead epidermis at medial surface of toe Selective Debridement - Tools Used: Forceps;Scissors Selective Debridement - Tissue Removed: more black tissue now covers the dorsal surface of the toe.Marland KitchenMarland KitchenIt is firmly adhered   Wound Assessment and Plan  Wound Therapy - Assess/Plan/Recommendations Wound Therapy - Clinical Statement: The color of the plantar surface of the 5th toe is black and there is an increase of necrotic tissue covering the dorsal surface of the toe which is firmly adhered.  The skin over the 5th metatarsal head (plantar surface) which has be n stark white in color is now turning black in color and the  skin temp is quite cool.  Pt is working on exercises for LE strengthening.  We will try to obtain an X LG post op shoe.       . Wound Therapy - Functional Problem List: Pt already has a left BKA from a foot wound similar to the current one.  He is a diabetic and is obese.  He has poor to no sensation of the right foot Factors Delaying/Impairing Wound Healing: Infection - systemic/local;Altered sensation;Diabetes Mellitus Hydrotherapy Plan: Debridement;Dressing change;Pulsatile lavage with suction;Patient/family education Wound Therapy - Frequency: 6X / week Wound Therapy - Current Recommendations: PT Wound Therapy - Follow Up Recommendations: [place] Wound Plan: MD will be monitoring the condition of the toe and assess for need of amputation   Wound Therapy Goals- Improve the function of patient's integumentary system by progressing the wound(s) through the phases of wound healing (inflammation - proliferation - remodeling) by: Decrease Necrotic Tissue - Progress: Not progressing Increase Granulation Tissue -  Progress: Progressing toward goal  Goals will be updated until maximal potential achieved or discharge criteria met.  Discharge criteria: when goals achieved, discharge from hospital, MD decision/surgical intervention, no  progress towards goals, refusal/missing three consecutive treatments without notification or medical reason.  GP     [name] 04/15/2015, 11:34 AM

## 2015-04-15 NOTE — Progress Notes (Signed)
Subjective: F/U for type 2 DM complicated by osteomyelitis of right foot. He is doing better. He will be on long term IV antibiotics . He is eating well. No hypoglycemia.  Objective: Vital signs in last 24 hours:  Filed Vitals:   04/14/15 1441 04/14/15 1728 04/14/15 2103 04/15/15 0636  BP: 132/79  98/77 105/61  Pulse: 91  86 71  Temp: 99.4 F (37.4 C) 101 F (38.3 C) 100.7 F (38.2 C) 99.1 F (37.3 C)  TempSrc: Axillary Oral Oral Oral  Resp: 18  17 15   Height:      Weight:    111.3 kg (245 lb 6 oz)  SpO2: 100%  98% 98%    Intake/Output Summary (Last 24 hours) at 04/15/15 0809 Last data filed at 04/15/15 0500  Gross per 24 hour  Intake    643 ml  Output      0 ml  Net    643 ml   Weight change: 1.4 kg (3 lb 1.4 oz)  GEN: no acute distress. HEENT: has Dentures , no pallor. Neck: No JVD, no goiter. Chest: Clear to auscultation. CVS: distant heart sounds, no MRG Abd: no tenderness, soft. Ext: dressed right foot ulcer, no discharge. Left AKA CNS: non focal.  Lab Results:  Basic Metabolic Panel:  Recent Labs  04/14/15 0629 04/15/15 0558  NA 138 139  K 3.7 3.5  CL 106 104  CO2 24 25  GLUCOSE 155* 163*  BUN 27* 27*  CREATININE 2.01* 1.95*  CALCIUM 7.8* 8.0*    Liver Function Tests:  No results for input(s): AST, ALT, ALKPHOS, BILITOT, PROT, ALBUMIN in the last 72 hours. No results for input(s): LIPASE, AMYLASE in the last 72 hours.  CBC:  Recent Labs  04/13/15 0629 04/15/15 0558  WBC 13.7* 13.6*  HGB 11.4* 10.3*  HCT 33.6* 30.6*  MCV 88.2 88.2  PLT 259 275    Cardiac Enzymes: No results for input(s): CKTOTAL, CKMB, CKMBINDEX, TROPONINI in the last 72 hours.  BNP: Invalid input(s): POCBNP  CBG:  Recent Labs  04/13/15 2050 04/14/15 0738 04/14/15 1115 04/14/15 1651 04/14/15 2241 04/15/15 0738  GLUCAP 155* 153* 211* 160* 212* 173*    Hemoglobin A1C: 7.1%     Studies/Results: US Arterial Seg Multiple  04/14/2015   CLINICAL  DATA:  Left below-knee amputation. Right foot cellulitis and gangrene fifth digit. Fifth toe ulceration. Hypertension, diabetes.  EXAM: NONINVASIVE PHYSIOLOGIC VASCULAR STUDY OF BILATERAL LOWER EXTREMITIES  TECHNIQUE: Evaluation of both lower extremities was performed at rest, including calculation of ankle-brachial indices, multiple segmental pressure evaluation, segmental Doppler and segmental pulse volume recording.  COMPARISON:  02/28/2003  FINDINGS: Right ABI:  Non calculable due to vascular noncompressibility  Left ABI:  (below-knee amputation)  Right Lower Extremity: Diffuse vascular noncompressibility probably secondary to medial calcification. Biphasic waveforms through the popliteal level, monophasic waveforms distally.  Left Lower Extremity: Biphasic waveform through the SFA. Below-knee amputation.  IMPRESSION: 1. Right lower extremity arterial occlusive disease of at least mild severity, with a significant infrapopliteal component. Evaluation limited secondary to vascular noncompressibility. Should the patient fail conservative treatment, consider CTA runoff (higher spatial resolution) or MRA runoff (no radiation risk, can be performed noncontrast in the setting of renal dysfunction) to better define the site and nature of arterial occlusive disease and delineate treatment options.   Electronically Signed   By: Lucrezia Europe M.D.   On: 04/14/2015 14:16     Medications:  Scheduled Meds: . atenolol  25 mg Oral Daily  .  cilostazol  100 mg Oral BID  . clopidogrel  75 mg Oral Daily  . furosemide  40 mg Oral Daily  . heparin subcutaneous  5,000 Units Subcutaneous 3 times per day  . insulin aspart  0-9 Units Subcutaneous TID WC  . insulin aspart  10 Units Subcutaneous TID WC  . insulin glargine  34 Units Subcutaneous QHS  . piperacillin-tazobactam (ZOSYN)  IV  3.375 g Intravenous Q8H  . rosuvastatin  20 mg Oral Daily  . sodium chloride  3 mL Intravenous Q12H  . vancomycin  1,250 mg Intravenous  Q24H   Continuous Infusions:  PRN Meds:.sodium chloride, acetaminophen, guaiFENesin-dextromethorphan, sodium chloride  Assessment/Plan: Principal Problem:   Diabetic foot infection Active Problems:   Essential hypertension   CKD (chronic kidney disease)   Type 2 diabetes mellitus with other circulatory complications   OSA (obstructive sleep apnea)   S/P unilateral BKA (below knee amputation)   Pressure ulcer  His studies show osteomyelitis of right foot, on IV antibiotics being planned for long term. His osteomyelitis , diabetic foot ulcer, and diabetes are related. He will need to continue to tighten control of glycemia. His recent a1c is 7.1%, increased from prior a1c of 5.7% in March. In house BG readings are getting better after his insulin was readjusted yesterday. He will tolerate higher dose of basal insulin. I will continue Lantus  at 34 units qhs, increase Novolog to 10 units TIDAC plus low dose Novolog correction. Continue monitoring BG AC and HS. I will reevaluate in AM for insulin dose adjustment. Glycemic control to target is required in the face of infected diabetic foot ulcer. Id d/c is planned , he will continue same dose of insulin until he return to clinic in 1 week.   LOS: 3 days    Loni Beckwith, MD 04/15/2015, 8:09 AM

## 2015-04-15 NOTE — Discharge Summary (Signed)
Physician Discharge Summary  [name] [id number] DOB: Nov 28, 1950 DOA: 04/11/2015  PCP: [name], MD  Admit date: 04/11/2015 Discharge date: 04/15/2015  Time spent: 45 minutes  Recommendations for Outpatient Follow-up:  -Will be discharged home today. -Will need follow-ups with Dr. Luna Glasgow, Dr. Megan Salon as well as the wound clinic in [place] for which he  has been scheduled for by case management.   Discharge Diagnoses:  Principal Problem:   Diabetic foot infection Active Problems:   Essential hypertension   CKD (chronic kidney disease)   Type 2 diabetes mellitus with other circulatory complications   OSA (obstructive sleep apnea)   S/P unilateral BKA (below knee amputation)   Pressure ulcer   Discharge Condition: Stable and improved  Filed Weights   04/13/15 0652 04/14/15 0600 04/15/15 0636  Weight: 111.1 kg (244 lb 14.9 oz) 109.9 kg (242 lb 4.6 oz) 111.3 kg (245 lb 6 oz)    History of present illness:  64 yo male h/o iddm, ckd, htn, hld s/p aka left lower extremity comes in with over one week of worsening redness, swelling to right 5th toe, foot up to ankle. He has an open sore to 5th toe on the right, with redness extending up to ankle with associated swelling. No fevers at home. No recent antibiotic use. Has not seen a doctor about this. Denies any trauma to this foot. This happened 12 years ago to his left leg which ended up in amputation. Reports his glucose has been doing very well at home.  Hospital Course:   Right fifth toe cellulitis with osteomyelitis -MRI has confirmed presence of osteomyelitis of the distal aspect of the proximal phalanx. -Discussed antibiotic regimen via telephone with infectious diseases, Dr. Megan Salon. He has stated that in this particular situation there is no benefit of IV antibiotics over PO. ID has recommended Augmentin twice daily for a protracted course. -ABIs do show evidence for decreased circulation which will make wound  healing very difficult, I suspect patient will eventually need amputation. -Has been scheduled for follow-up at the wound clinic in [place].  Type 2 diabetes mellitus -Has been seen in the hospital by Dr. Dorris Fetch who has been adjusting his insulin, CBGs are much improved. -Continue outpatient follow-up for continued diabetic management.  Chronic diastolic CHF -Compensated.   Hypertension -Stable, continue home regimen  Chronic kidney disease stage 3-4 -Creatinine has remained at baseline of 1.9-2.2  Procedures:  None   Consultations:  Endocrinology  Orthopedics  Discharge Instructions  Discharge Instructions    Diet - low sodium heart healthy    Complete by:  As directed      Diet Carb Modified    Complete by:  As directed      Increase activity slowly    Complete by:  As directed             Medication List    TAKE these medications        acetaminophen 500 MG tablet  Commonly known as:  TYLENOL  Take 500-1,500 mg by mouth every 6 (six) hours as needed for mild pain or moderate pain.     amoxicillin-clavulanate 875-125 MG per tablet  Commonly known as:  AUGMENTIN  Take 1 tablet by mouth every 12 (twelve) hours. For 6 weeks     atenolol 25 MG tablet  Commonly known as:  TENORMIN  Take 1 tablet (25 mg total) by mouth daily.     BYETTA 10 MCG PEN 10 MCG/0.04ML Sopn injection  Generic drug:  exenatide  Inject 10 mcg into the skin 2 (two) times daily.     CALCIUM PLUS VITAMIN D3 600-500 MG-UNIT Caps  Generic drug:  Calcium Carb-Cholecalciferol  Take 1 capsule by mouth daily.     cilostazol 100 MG tablet  Commonly known as:  PLETAL  Take 1 tablet (100 mg total) by mouth 2 (two) times daily.     clopidogrel 75 MG tablet  Commonly known as:  PLAVIX  Take 1 tablet (75 mg total) by mouth daily.     furosemide 40 MG tablet  Commonly known as:  LASIX  Take 1 tablet (40 mg total) by mouth daily.     insulin aspart 100 UNIT/ML injection  Commonly known as:   novoLOG  Inject 3 Units into the skin 3 (three) times daily as needed for high blood sugar. Only take if blood sugar is over 150. Take 3 units if blood sugar is between 150-160; 4 units=161-170; 5 units= 171-180; 6 units 181-190; 7 units = 191-200. Call MD if >200.     LANTUS SOLOSTAR 100 UNIT/ML Solostar Pen  Generic drug:  Insulin Glargine  Inject 34 Units into the skin at bedtime.     rosuvastatin 20 MG tablet  Commonly known as:  CRESTOR  Take 1 tablet (20 mg total) by mouth daily.       Allergies  Allergen Reactions  . Zolpidem Tartrate Other (See Comments)    disorientation        Follow-up Information    Follow up with [name], MD. Schedule an appointment as soon as possible for a visit on 04/29/2015.   Specialty:  Family Medicine   Why:  follow-up appt thursday june 30,2016 at 11:15   Contact information:   [place] [address] [phone]       Follow up with NIDA,GEBRESELASSIE, MD. Schedule an appointment as soon as possible for a visit on 04/30/2015.   Specialty:  Endocrinology   Why:  follow-up appt on friday july 1,2016 at 11:00   Contact information:   [place] [address] [phone]       Follow up with Michel Bickers, MD. Schedule an appointment as soon as possible for a visit on 05/17/2015.   Specialty:  Infectious Diseases   Why:  follow-up appt on monday july 18,2016 at 2:00   Contact information:   [place] [address] [phone]       Follow up with [facility].   Contact information:   [address] [place]  [address] [phone]        The results of significant diagnostics from this hospitalization (including imaging, microbiology, ancillary and laboratory) are listed below for reference.    Significant Diagnostic Studies: Mr Foot Right Wo Contrast  04/12/2015   CLINICAL DATA:  Cellulitis and open wound on the distal little toe.   EXAM: MRI OF THE RIGHT FOREFOOT WITHOUT CONTRAST  TECHNIQUE: Multiplanar, multisequence MR imaging was performed. No intravenous contrast was administered.  COMPARISON:  Radiographs dated 04/12/2015  FINDINGS: There is a soft tissue ulceration at the lateral aspect of the PIP joint of the little toe. There is a nonunion fracture of the proximal shaft of the proximal phalanx and a subtle fracture of the distal proximal phalanx. There is poorly defined fluid in and around the PIP joint and extending proximally along the proximal phalanx with this mall effusion in the fifth metatarsal  phalangeal joint.  There are moderate arthritic changes at the second and third tarsometatarsal joints and between the bases of the first and second and second and third and third and fourth metatarsals.  There is slight nonspecific dorsal subcutaneous soft tissue edema. No  Moderate arthritis of the first metatarsal phalangeal joint with hallux valgus deformity and bunion formation.  IMPRESSION: Fractures of the proximal and distal aspects of proximal phalanx of the little toe with an overlying soft tissue ulceration. Adjacent fluid in the soft tissues could represent infection. I suspect the patient has osteomyelitis of the distal aspect of the proximal phalanx.   Electronically Signed   By: Lorriane Shire M.D.   On: 04/12/2015 09:33   US Arterial Seg Multiple  04/14/2015   CLINICAL DATA:  Left below-knee amputation. Right foot cellulitis and gangrene fifth digit. Fifth toe ulceration. Hypertension, diabetes.  EXAM: NONINVASIVE PHYSIOLOGIC VASCULAR STUDY OF BILATERAL LOWER EXTREMITIES  TECHNIQUE: Evaluation of both lower extremities was performed at rest, including calculation of ankle-brachial indices, multiple segmental pressure evaluation, segmental Doppler and segmental pulse volume recording.  COMPARISON:  02/28/2003  FINDINGS: Right ABI:  Non calculable due to vascular noncompressibility  Left ABI:  (below-knee amputation)   Right Lower Extremity: Diffuse vascular noncompressibility probably secondary to medial calcification. Biphasic waveforms through the popliteal level, monophasic waveforms distally.  Left Lower Extremity: Biphasic waveform through the SFA. Below-knee amputation.  IMPRESSION: 1. Right lower extremity arterial occlusive disease of at least mild severity, with a significant infrapopliteal component. Evaluation limited secondary to vascular noncompressibility. Should the patient fail conservative treatment, consider CTA runoff (higher spatial resolution) or MRA runoff (no radiation risk, can be performed noncontrast in the setting of renal dysfunction) to better define the site and nature of arterial occlusive disease and delineate treatment options.   Electronically Signed   By: Lucrezia Europe M.D.   On: 04/14/2015 14:16   Dg Chest Portable 1 View  04/12/2015   CLINICAL DATA:  Right foot pain and swelling with redness to the fifth digit for 2 weeks. Fever and cough for 1 week.  EXAM: PORTABLE CHEST - 1 VIEW  COMPARISON:  06/26/2014  FINDINGS: The heart size and mediastinal contours are within normal limits. Both lungs are clear. The visualized skeletal structures are unremarkable.  IMPRESSION: No active disease.   Electronically Signed   By: Lucienne Capers M.D.   On: 04/12/2015 02:46   Dg Foot Complete Right  04/12/2015   CLINICAL DATA:  Right fifth toe pain, with fever, swelling and erythema for 2 weeks. Initial encounter.  EXAM: RIGHT FOOT COMPLETE - 3+ VIEW  COMPARISON:  None.  FINDINGS: There is vague lucency involving the distal aspect of the fifth proximal phalanx, raising question for underlying osteomyelitis, though this is only seen on a single image. Surrounding soft tissue swelling is noted at the fifth toe.  The joint spaces are preserved. There is no evidence of talar subluxation; the subtalar joint is unremarkable in appearance. Plantar and posterior calcaneal spurs are seen. A prominent os peroneum  is noted. There is a bipartite medial sesamoid of the first toe.  Diffuse vascular calcifications are seen.  IMPRESSION: 1. Vague lucency involving the distal aspect of the fifth proximal phalanx, raising question for underlying osteomyelitis, though this is only seen on a single image. MRI could be considered for further evaluation, as deemed clinically appropriate. 2. Diffuse vascular calcifications seen. 3. Prominent os peroneum noted. 4. Bipartite medial sesamoid of the first toe.   Electronically  Signed   By: Garald Balding M.D.   On: 04/12/2015 00:42    Microbiology: Recent Results (from the past 240 hour(s))  Blood Culture (routine x 2)     Status: None (Preliminary result)   Collection Time: 04/12/15 12:23 AM  Result Value Ref Range Status   Specimen Description RIGHT ANTECUBITAL  Final   Special Requests BOTTLES DRAWN AEROBIC ONLY 8CC  Final   Culture NO GROWTH 2 DAYS  Final   Report Status PENDING  Incomplete  Blood Culture (routine x 2)     Status: None (Preliminary result)   Collection Time: 04/12/15 12:37 AM  Result Value Ref Range Status   Specimen Description BLOOD RIGHT HAND  Final   Special Requests BOTTLES DRAWN AEROBIC ONLY 8CC  Final   Culture NO GROWTH 2 DAYS  Final   Report Status PENDING  Incomplete  Urine culture     Status: None   Collection Time: 04/12/15  2:24 AM  Result Value Ref Range Status   Specimen Description URINE, CLEAN CATCH  Final   Special Requests NONE  Final   Colony Count NO GROWTH Performed at [facility]   Final   Culture NO GROWTH Performed at [facility]   Final   Report Status 04/13/2015 FINAL  Final     Labs: Basic Metabolic Panel:  Recent Labs Lab 04/12/15 0023 04/13/15 0629 04/14/15 0629 04/15/15 0558  NA 133* 137 138 139  K 4.0 4.2 3.7 3.5  CL 100* 104 106 104  CO2 23 24 24 25   GLUCOSE 194* 222* 155* 163*  BUN 36* 31* 27* 27*  CREATININE 2.19* 1.96* 2.01* 1.95*  CALCIUM 8.3* 8.0* 7.8* 8.0*   Liver  Function Tests:  Recent Labs Lab 04/12/15 0023  AST 17  ALT 14*  ALKPHOS 65  BILITOT 0.9  PROT 6.7  ALBUMIN 2.6*   No results for input(s): LIPASE, AMYLASE in the last 168 hours. No results for input(s): AMMONIA in the last 168 hours. CBC:  Recent Labs Lab 04/12/15 0023 04/13/15 0629 04/15/15 0558  WBC 13.6* 13.7* 13.6*  NEUTROABS 10.2*  --   --   HGB 11.4* 11.4* 10.3*  HCT 33.2* 33.6* 30.6*  MCV 88.1 88.2 88.2  PLT 250 259 275   Cardiac Enzymes: No results for input(s): CKTOTAL, CKMB, CKMBINDEX, TROPONINI in the last 168 hours. BNP: BNP (last 3 results) No results for input(s): BNP in the last 8760 hours.  ProBNP (last 3 results) No results for input(s): PROBNP in the last 8760 hours.  CBG:  Recent Labs Lab 04/14/15 1115 04/14/15 1651 04/14/15 2241 04/15/15 0738 04/15/15 1125  GLUCAP 211* 160* 212* 173* 194*       Signed:  [facility] Pager: [phone] 04/15/2015, 3:10 PM

## 2015-04-15 NOTE — Progress Notes (Signed)
Subjective:     Patient reports pain as mild.    Objective: Vital signs in last 24 hours: Temp:  [99.1 F (37.3 C)-101 F (38.3 C)] 99.1 F (37.3 C) (06/16 0636) Pulse Rate:  [71-91] 71 (06/16 0636) Resp:  [15-18] 15 (06/16 0636) BP: (98-132)/(61-79) 105/61 mmHg (06/16 0636) SpO2:  [98 %-100 %] 98 % (06/16 0636) Weight:  [111.3 kg (245 lb 6 oz)] 111.3 kg (245 lb 6 oz) (06/16 0636)  Intake/Output from previous day: 06/15 0701 - 06/16 0700 In: 643 [P.O.:240; I.V.:3; IV Piggyback:400] Out: -  Intake/Output this shift:     Recent Labs  04/13/15 0629 04/15/15 0558  HGB 11.4* 10.3*    Recent Labs  04/13/15 0629 04/15/15 0558  WBC 13.7* 13.6*  RBC 3.81* 3.47*  HCT 33.6* 30.6*  PLT 259 275    Recent Labs  04/14/15 0629 04/15/15 0558  NA 138 139  K 3.7 3.5  CL 106 104  CO2 24 25  BUN 27* 27*  CREATININE 2.01* 1.95*  GLUCOSE 155* 163*  CALCIUM 7.8* 8.0*   No results for input(s): LABPT, INR in the last 72 hours.  Neurologically intact   His right great toe has eschar on the dorsal aspect but much less discharge.  Plantar side looks good.  He has been in bed a lot.  He has amputation on the left but he needs to be out of bed more.  He has some changes of the heel area from being in bed so much.  PT is concerned about a possible area of the plantar foot between fourth and fifth metatarsal area but it is not red or draining.    He had vascular study showing decreased arterial supply to the foot which is not unexpected.  Prognosis is worse with this result.    Again, I recommend PICC line.  He has changes to the right foot directly related to his diabetes mellitus as a complication of the diabetes and poor circulation.  He will need several weeks of antibiotics.  He could still lose the toe or even the foot if infection worsens or circulation gets worse.  I think he could go home with home health and IV antibiotics.    Consult with Dr. Arnoldo Morale, general surgery, is  also advised so he can weigh in.  I usually ask him to preform amputation if it comes to that.  Hopefully it will not.  I have discussed with patient about PICC line and also with his wife.  It is still a day by day evaluation of the foot/toe.  He is a little better but that could change suddenly and I did emphasize that fact.   Assessment/Plan:  Get PICC line placed.  Consider home health.  Continue wound care.  Continue IV antibiotics.       [name] 04/15/2015, 8:11 AM

## 2015-04-19 ENCOUNTER — Encounter: Payer: Self-pay | Admitting: Family Medicine

## 2015-04-19 LAB — CULTURE, BLOOD (ROUTINE X 2)
Culture: NO GROWTH
Culture: NO GROWTH

## 2015-04-20 ENCOUNTER — Encounter (HOSPITAL_COMMUNITY): Payer: BLUE CROSS/BLUE SHIELD

## 2015-04-26 ENCOUNTER — Emergency Department (HOSPITAL_COMMUNITY)
Admission: EM | Admit: 2015-04-26 | Discharge: 2015-04-26 | Disposition: A | Discharge status: Home / Self Care | Payer: BLUE CROSS/BLUE SHIELD | Attending: Emergency Medicine | Admitting: Emergency Medicine

## 2015-04-26 ENCOUNTER — Encounter (HOSPITAL_COMMUNITY): Payer: Self-pay | Admitting: Family Medicine

## 2015-04-26 DIAGNOSIS — E119 Type 2 diabetes mellitus without complications: Secondary | ICD-10-CM | POA: Insufficient documentation

## 2015-04-26 DIAGNOSIS — Z792 Long term (current) use of antibiotics: Secondary | ICD-10-CM | POA: Insufficient documentation

## 2015-04-26 DIAGNOSIS — E785 Hyperlipidemia, unspecified: Secondary | ICD-10-CM | POA: Insufficient documentation

## 2015-04-26 DIAGNOSIS — Z794 Long term (current) use of insulin: Secondary | ICD-10-CM | POA: Diagnosis not present

## 2015-04-26 DIAGNOSIS — I129 Hypertensive chronic kidney disease with stage 1 through stage 4 chronic kidney disease, or unspecified chronic kidney disease: Secondary | ICD-10-CM | POA: Insufficient documentation

## 2015-04-26 DIAGNOSIS — Z7902 Long term (current) use of antithrombotics/antiplatelets: Secondary | ICD-10-CM | POA: Insufficient documentation

## 2015-04-26 DIAGNOSIS — Z79899 Other long term (current) drug therapy: Secondary | ICD-10-CM | POA: Diagnosis not present

## 2015-04-26 DIAGNOSIS — I96 Gangrene, not elsewhere classified: Secondary | ICD-10-CM | POA: Insufficient documentation

## 2015-04-26 DIAGNOSIS — N189 Chronic kidney disease, unspecified: Secondary | ICD-10-CM | POA: Insufficient documentation

## 2015-04-26 DIAGNOSIS — L089 Local infection of the skin and subcutaneous tissue, unspecified: Secondary | ICD-10-CM | POA: Diagnosis present

## 2015-04-26 LAB — CBC
HCT: 33.9 % — ABNORMAL LOW (ref 39.0–52.0)
Hemoglobin: 11.5 g/dL — ABNORMAL LOW (ref 13.0–17.0)
MCH: 29.5 pg (ref 26.0–34.0)
MCHC: 33.9 g/dL (ref 30.0–36.0)
MCV: 86.9 fL (ref 78.0–100.0)
PLATELETS: 594 10*3/uL — AB (ref 150–400)
RBC: 3.9 MIL/uL — ABNORMAL LOW (ref 4.22–5.81)
RDW: 14 % (ref 11.5–15.5)
WBC: 9.4 10*3/uL (ref 4.0–10.5)

## 2015-04-26 LAB — COMPREHENSIVE METABOLIC PANEL
ALBUMIN: 2.5 g/dL — AB (ref 3.5–5.0)
ALT: 34 U/L (ref 17–63)
AST: 35 U/L (ref 15–41)
Alkaline Phosphatase: 75 U/L (ref 38–126)
Anion gap: 10 (ref 5–15)
BUN: 42 mg/dL — ABNORMAL HIGH (ref 6–20)
CO2: 22 mmol/L (ref 22–32)
CREATININE: 1.71 mg/dL — AB (ref 0.61–1.24)
Calcium: 8.9 mg/dL (ref 8.9–10.3)
Chloride: 105 mmol/L (ref 101–111)
GFR calc Af Amer: 48 mL/min — ABNORMAL LOW (ref >60)
GFR calc non Af Amer: 41 mL/min — ABNORMAL LOW (ref >60)
Glucose, Bld: 89 mg/dL (ref 65–99)
Potassium: 4.1 mmol/L (ref 3.5–5.1)
SODIUM: 137 mmol/L (ref 135–145)
TOTAL PROTEIN: 7.4 g/dL (ref 6.5–8.1)
Total Bilirubin: 0.3 mg/dL (ref 0.3–1.2)

## 2015-04-26 LAB — CBG MONITORING, ED: GLUCOSE-CAPILLARY: 106 mg/dL — AB (ref 65–99)

## 2015-04-26 NOTE — Discharge Instructions (Signed)
Diabetes and Foot Care Diabetes may cause you to have problems because of poor blood supply (circulation) to your feet and legs. This may cause the skin on your feet to become thinner, break easier, and heal more slowly. Your skin may become dry, and the skin may peel and crack. You may also have nerve damage in your legs and feet causing decreased feeling in them. You may not notice minor injuries to your feet that could lead to infections or more serious problems. Taking care of your feet is one of the most important things you can do for yourself.  HOME CARE INSTRUCTIONS  Wear shoes at all times, even in the house. Do not go barefoot. Bare feet are easily injured.  Check your feet daily for blisters, cuts, and redness. If you cannot see the bottom of your feet, use a mirror or ask someone for help.  Wash your feet with warm water (do not use hot water) and mild soap. Then pat your feet and the areas between your toes until they are completely dry. Do not soak your feet as this can dry your skin.  Apply a moisturizing lotion or petroleum jelly (that does not contain alcohol and is unscented) to the skin on your feet and to dry, brittle toenails. Do not apply lotion between your toes.  Trim your toenails straight across. Do not dig under them or around the cuticle. File the edges of your nails with an emery board or nail file.  Do not cut corns or calluses or try to remove them with medicine.  Wear clean socks or stockings every day. Make sure they are not too tight. Do not wear knee-high stockings since they may decrease blood flow to your legs.  Wear shoes that fit properly and have enough cushioning. To break in new shoes, wear them for just a few hours a day. This prevents you from injuring your feet. Always look in your shoes before you put them on to be sure there are no objects inside.  Do not cross your legs. This may decrease the blood flow to your feet.  If you find a minor scrape,  cut, or break in the skin on your feet, keep it and the skin around it clean and dry. These areas may be cleansed with mild soap and water. Do not cleanse the area with peroxide, alcohol, or iodine.  When you remove an adhesive bandage, be sure not to damage the skin around it.  If you have a wound, look at it several times a day to make sure it is healing.  Do not use heating pads or hot water bottles. They may burn your skin. If you have lost feeling in your feet or legs, you may not know it is happening until it is too late.  Make sure your health care provider performs a complete foot exam at least annually or more often if you have foot problems. Report any cuts, sores, or bruises to your health care provider immediately. SEEK MEDICAL CARE IF:   You have an injury that is not healing.  You have cuts or breaks in the skin.  You have an ingrown nail.  You notice redness on your legs or feet.  You feel burning or tingling in your legs or feet.  You have pain or cramps in your legs and feet.  Your legs or feet are numb.  Your feet always feel cold. SEEK IMMEDIATE MEDICAL CARE IF:   There is increasing redness,  swelling, or pain in or around a wound.  There is a red line that goes up your leg.  Pus is coming from a wound.  You develop a fever or as directed by your health care provider.  You notice a bad smell coming from an ulcer or wound. Document Released: 10/13/2000 Document Revised: 06/18/2013 Document Reviewed: 03/25/2013 [facility] Patient Information 2015 [place], [place]. This information is not intended to replace advice given to you by your health care provider. Make sure you discuss any questions you have with your health care provider.       Gangrene Gangrene is the decay or death of an organ or tissue caused by loss of blood supply. It may be caused by infectious or inflammatory processes. Or it may be caused by degenerative changes in long-standing diseases  such as diabetes. It may also occur following injuries or surgical procedures. TYPES OF GANGRENE There are three major types of gangrene: dry, moist, and gaseous (a type of moist gangrene).  Dry gangrene is a condition that results when one or more arteries become blocked. Arteries are muscular vessels which carry blood away from the heart and around the body. In this type of gangrene, the tissue slowly dies, due to loss of blood supply. But it does not become infected. The affected area becomes cold and black. It begins to dry out and wither. Eventually it drops off over a period of weeks or months. Dry gangrene is most common in people with advanced blockages of the arteries (arteriosclerosis or hardening of the arteries) resulting from diabetes.  Moist gangrene occurs in the toes, feet, or legs after a crushing injury. Or it can be a result of some other problem that causes blood flow in an area to suddenly stop. When blood flow stops, bacteria begin to invade the muscle and grow. It multiplies quickly, without the body's immune system stopping it.  Gaseous gangrene is also called muscle death (myonecrosis). It is a type of moist gangrene commonly caused by a bacterial infection. This is usually caused by a bacteria which can live in an area of little or no oxygen. Once present in tissue, these bacteria produce gases and poisonous toxins as they grow. These bacteria are normally found in the gut, respiratory, and male urinary tract. They often infect thigh amputation wounds commonly in people who have lost control of their bowel functions (incontinence). Gangrene, incontinence, and weakness often are found in patients with diabetes. It is in the amputation stump of diabetic patients that gaseous gangrene often happens. The most common bacteria to cause gaseous gangrene is clostridia. Other bacteria which cause moist gangrene include streptococcus and staphylococcus. A serious, but rare form of infection  with group A streptococci can slow blood flow. If untreated, it can progress to gangrene. This is more commonly called necrotizing fasciitis. This is an infection of the skin and tissues directly beneath the skin. This is sometimes called the "flesh-eating bacteria". It is called this because it can rapidly kill large areas of flesh in the body. It requires immediate surgical treatment or it will result in death. CAUSES   Some illnesses can cause gangrene because the vessels carrying the blood are already diseased. Examples are:  Long-standing illnesses, such as diabetes mellitus or arteriosclerosis.  Other diseases affecting the blood vessels, such as Buerger disease or Raynaud disease. Other causes of gangrene include:   Open bone breaks.  Burns.  Injections given under the skin or in a muscle.  Gangrene may occur  following surgery, particularly in individuals with diabetes or other long-term (chronic) disease.  Gas gangrene can be also a complication of dry gangrene. Or it can happen suddenly along with an underlying cancer. Moist gangrene and gas gangrene are different. Gas gangrene usually involves only the muscle. It does not involve the skin as much. In moist or gas gangrene, there is a feeling of heaviness in the affected area that is followed by severe pain. The pain is caused by swelling from fluid or gas accumulation in the tissues. This pain is the worst, on average, between 1 and 4 days following the injury. It can range from several hours to several weeks. The swollen skin may at first be blistered, red, and warm to the touch. Then it changes to a bronze, brown, or black color. In the majority of cases, the affected and surrounding tissues may give off crackling sounds (crepitus). This is a result of gas bubbles accumulating under the skin. The gas may be felt beneath the skin. In wet gangrene, the pus is foul-smelling. In gas gangrene, there is no true pus, just an almost "sweet"  smelling watery discharge. If the bacterial toxins spread to the bloodstream, the following may happen:  A higher than normal temperature.  Fast heart rate and breathing.  Changed mental state.  Loss of appetite.  Diarrhea.  Vomiting.  Shock. SYMPTOMS  Areas of either dry or moist gangrene are first noticed as a red line on the skin that marks the border of the affected tissues.  As tissues begin to die, dry gangrene may cause pain in the early stages. Or it may go unnoticed.  This happens especially in the elderly or in individuals with decreased ability to feel pain.  At first, the area becomes cold, numb, and pale. Later its color changes to brown, then black. This dead tissue will gradually separate from the healthy tissue and fall off. DIAGNOSIS  Diagnosis of gangrene is based on patient history, physical examination, and results of blood and other lab tests.  A caregiver will look for a history of:  Recent damage caused by an accident (trauma).  Surgery.  Cancer.  Chronic disease.  Blood tests can be used to find out if infection is present and find how much it has spread.  Drainage from a wound, or a tissue sample obtained through surgical exploration, may be cultured to:  Identify the bacteria causing the infection.  Help in figuring out which antibiotic will be most effective.  Gas accumulation and muscle death (myonecrosis) may not be visible. So X-ray studies and more sophisticated imaging techniques may be helpful in making a diagnosis. They include:  Computed tomography (CT) scans.  Magnetic resonance imaging (MRI).  These techniques are not sufficient alone to provide an accurate diagnosis of gangrene, though.  Precise diagnosis of gas gangrene often requires surgical exploration of the wound.  During such a procedure, the exposed muscle may appear pale, beefy-red. In the most advanced stages, it may be black. If infected, the muscle will fail to  contract with stimulation, and the cut surface will not bleed. TREATMENT  Gaseous gangrene is a medical emergency. It requires immediate surgery and antibiotics. If the infection spreads to the bloodstream and infects vital organs, it can rapidly result in death.   Areas of dry gangrene that remain free from infection (aseptic) in the arms or hands and legs or feet (extremities) are often left to wither and fall off. Treatments applied to the wound on the  surface are generally not effective without enough blood supply to support wound healing.  Evaluation by a vascular surgeon, along with X-rays to determine blood supply and circulation to the affected area, can help figure out if surgery would be helpful.  Once the cause of infection is found, moist gangrene requires immediate intravenous, intramuscular, or topical broad-spectrum antibiotic therapy. Also, the infected tissue must be removed surgically. Amputation of the affected limb may be needed. Pain medicines (analgesics) are prescribed for pain. Fluids injected into the veins (intravenous fluids) and, occasionally, blood transfusions are used to treat shock and replenish red blood cells and electrolytes. Plenty of water and healthy foods are needed for wound healing.  Some cases of gangrene are treated by giving oxygen under pressure greater than that of the atmosphere (hyperbaric) to the patient. This happens in a specially designed chamber. The idea behind using hyperbaric oxygen is that more oxygen will become dissolved in the patient's bloodstream. So more oxygen will be delivered to the gangrenous areas. By providing the right amount of oxygenation, the body's ability to fight off the bacterial infection is believed to be improved. There is a direct harmful effect on the bacteria that thrive in an oxygen-free environment. Some studies have shown that the use of hyperbaric oxygen relieves pain, reduces the number of amputations required, and  reduces the extent of surgical debridement (removal of dead or damaged tissue).  The emotional needs of the patient must also be met. The individual with gangrene should be offered moral support, along with an opportunity to share questions and concerns. In addition, particularly in cases where amputation was required; physical, vocational, and rehabilitation therapy will also be required. Except in cases where the infection has spread to the bloodstream, the result of treated gangrene is usually favorable.  PREVENTION  Patients with diabetes or severe arteriosclerosis should take special care of their hands and feet. This is due to the risk of infection associated with even minor injuries. Education about good foot care is important. Poor blood flow as a result diseased blood vessels will lessen the body's defenses against invading germs. Your caregiver will take steps to improve circulation whenever possible. All injuries to skin or tissue should be cared for immediately. Dying or infected skin should be removed immediately. This will prevent the spread of bacteria. Wounds into the abdomen should be surgically treated and damage repaired early. This should be followed up with antibiotic treatment. Patients undergoing non-emergency (elective) intestinal surgery may receive preventive antibiotic therapy. Use of antibiotics prior to and directly following surgery has been shown to significantly reduce the rate of infection.  SEEK IMMEDIATE MEDICAL CARE IF:  You show signs of gaseous gangrene. MAKE SURE YOU:   Understand these instructions.  Will watch your condition.  Will get help right away if you are not doing well or get worse. Document Released: 08/17/2004 Document Revised: 04/16/2012 Document Reviewed: 12/10/2013 [facility] Patient Information 2015 [place], [place]. This information is not intended to replace advice given to you by your health care provider. Make sure you discuss any questions you  have with your health care provider.

## 2015-04-26 NOTE — ED Provider Notes (Signed)
CSN: [id number]     Arrival date & time 04/26/15  1417 History   First MD Initiated Contact with Patient 04/26/15 1814     Chief Complaint  Patient presents with  . Wound Infection     (Consider location/radiation/quality/duration/timing/severity/associated sxs/prior Treatment) HPI  64 year old male presents for evaluation of his right lateral foot after's home health nurse was concerned that he is developing gangrene. The patient has been having a wound over his fifth digit for the past several weeks. Was admitted to [facility] on 6/12 and discharged on 6/16 after IV antibody extend discharge home on Augmentin for osteomyelitis. Has mild intermittent pain but over last couple days has developed blackness to the distal end of his right pinky toe. Some discoloration to his distal foot as well. No recent fevers. Mild wound drainage but no increase in wound drainage.  Past Medical History  Diagnosis Date  . Hypertension   . Hyperlipidemia   . Type 2 Diabetes mellitus   . Necrosis     #2 nail   . CKD (chronic kidney disease) 06/28/2014   Past Surgical History  Procedure Laterality Date  . Above knee leg amputation  2004  . Cataract extraction w/phaco  09/05/2012    Procedure: CATARACT EXTRACTION PHACO AND INTRAOCULAR LENS PLACEMENT (IOC);  Surgeon: Tonny Branch, MD;  Location: [facility];  Service: Ophthalmology;  Laterality: Left;  CDE=5.45  . Cataract extraction w/phaco  10/03/2012    Procedure: CATARACT EXTRACTION PHACO AND INTRAOCULAR LENS PLACEMENT (IOC);  Surgeon: Tonny Branch, MD;  Location: [facility];  Service: Ophthalmology;  Laterality: Right;  CDE: 12.31   Family History  Problem Relation Age of Onset  . Diabetes Mother   . Hypertension Mother   . Cancer Father   . Cancer Sister   . Sickle cell anemia Daughter    History  Substance Use Topics  . Smoking status: Never Smoker   . Smokeless tobacco: Never Used  . Alcohol Use: No    Review of Systems  Constitutional: Negative for  fever.  Musculoskeletal: Positive for arthralgias.  Skin: Positive for wound.  All other systems reviewed and are negative.     Allergies  Zolpidem tartrate  Home Medications   Prior to Admission medications   Medication Sig Start Date End Date Taking? Authorizing Provider  acetaminophen (TYLENOL) 500 MG tablet Take 500-1,500 mg by mouth every 6 (six) hours as needed for mild pain or moderate pain.    Historical Provider, MD  amoxicillin-clavulanate (AUGMENTIN) 875-125 MG per tablet Take 1 tablet by mouth every 12 (twelve) hours. For 6 weeks 04/15/15   Erline Hau, MD  atenolol (TENORMIN) 25 MG tablet Take 1 tablet (25 mg total) by mouth daily. 02/23/15   Wardell Honour, MD  BYETTA 10 MCG PEN 10 MCG/0.04ML SOPN injection Inject 10 mcg into the skin 2 (two) times daily. 01/13/15   Historical Provider, MD  Calcium Carb-Cholecalciferol (CALCIUM PLUS VITAMIN D3) 600-500 MG-UNIT CAPS Take 1 capsule by mouth daily.     Historical Provider, MD  cilostazol (PLETAL) 100 MG tablet Take 1 tablet (100 mg total) by mouth 2 (two) times daily. 02/23/15   Wardell Honour, MD  clopidogrel (PLAVIX) 75 MG tablet Take 1 tablet (75 mg total) by mouth daily. 02/23/15   Wardell Honour, MD  furosemide (LASIX) 40 MG tablet Take 1 tablet (40 mg total) by mouth daily. 02/23/15   Wardell Honour, MD  insulin aspart (NOVOLOG) 100 UNIT/ML injection Inject 3  Units into the skin 3 (three) times daily as needed for high blood sugar. Only take if blood sugar is over 150. Take 3 units if blood sugar is between 150-160; 4 units=161-170; 5 units= 171-180; 6 units 181-190; 7 units = 191-200. Call MD if >200.    Historical Provider, MD  LANTUS SOLOSTAR 100 UNIT/ML Solostar Pen Inject 34 Units into the skin at bedtime. 04/15/15   Erline Hau, MD  rosuvastatin (CRESTOR) 20 MG tablet Take 1 tablet (20 mg total) by mouth daily. 02/23/15   Wardell Honour, MD   BP 109/71 mmHg  Pulse 67  Temp(Src) 98.5 F  (36.9 C) (Oral)  Resp 18  SpO2 96% Physical Exam  Constitutional: He is oriented to person, place, and time. He appears well-developed and well-nourished.  HENT:  Head: Normocephalic and atraumatic.  Right Ear: External ear normal.  Left Ear: External ear normal.  Nose: Nose normal.  Eyes: Right eye exhibits no discharge. Left eye exhibits no discharge.  Neck: Neck supple.  Cardiovascular: Normal rate, regular rhythm, normal heart sounds and intact distal pulses.   Pulses:      Dorsalis pedis pulses are 1+ on the right side, and 1+ on the left side.  Pulmonary/Chest: Effort normal.  Abdominal: Soft. He exhibits no distension.  Musculoskeletal: He exhibits no edema.       Feet:  Neurological: He is alert and oriented to person, place, and time.  Skin: Skin is warm and dry.  Nursing note and vitals reviewed.   ED Course  Procedures (including critical care time) Labs Review Labs Reviewed  CBC - Abnormal; Notable for the following:    RBC 3.90 (*)    Hemoglobin 11.5 (*)    HCT 33.9 (*)    Platelets 594 (*)    All other components within normal limits  COMPREHENSIVE METABOLIC PANEL - Abnormal; Notable for the following:    BUN 42 (*)    Creatinine, Ser 1.71 (*)    Albumin 2.5 (*)    GFR calc non Af Amer 41 (*)    GFR calc Af Amer 48 (*)    All other components within normal limits  CBG MONITORING, ED - Abnormal; Notable for the following:    Glucose-Capillary 106 (*)    All other components within normal limits    Imaging Review No results found.   EKG Interpretation None      MDM   Final diagnoses:  Gangrene of toe    Patient appears to be developing gangrene to his right fifth digit. Patient is currently on appropriate antibiotics and has minimal pain. Normal WBC and no fever. Discussed with orthopedics, Dr. Lorin Mercy, who recommends ABI as outpatient as soon as possible and follow-up in his clinic day of her next day. Will need amputation but given that his  pain is controlled he does not need readmission per Dr. Lorin Mercy. Discussed this with patient and family and discussed strict return precautions.    Sherwood Gambler, MD 04/26/15 [phone]

## 2015-04-26 NOTE — ED Notes (Signed)
Pt instructed to follow up for ABI tomorrow morning between 8 and 9. Go to admitting. Spoke with candice from vascular to confirm this info.

## 2015-04-26 NOTE — ED Notes (Signed)
Pt here for worsening right foot infection. sts recently treated at [place] x 4 days with IV abx.

## 2015-04-27 ENCOUNTER — Ambulatory Visit (HOSPITAL_COMMUNITY)
Admission: RE | Admit: 2015-04-27 | Discharge: 2015-04-27 | Disposition: A | Discharge status: Home / Self Care | Payer: BLUE CROSS/BLUE SHIELD | Source: Ambulatory Visit | Attending: Emergency Medicine | Admitting: Emergency Medicine

## 2015-04-27 ENCOUNTER — Encounter (HOSPITAL_COMMUNITY): Payer: Self-pay | Admitting: *Deleted

## 2015-04-27 ENCOUNTER — Encounter (HOSPITAL_COMMUNITY): Payer: BLUE CROSS/BLUE SHIELD

## 2015-04-27 ENCOUNTER — Other Ambulatory Visit (HOSPITAL_COMMUNITY): Payer: Self-pay | Admitting: Orthopaedic Surgery

## 2015-04-28 ENCOUNTER — Ambulatory Visit (HOSPITAL_COMMUNITY): Payer: BLUE CROSS/BLUE SHIELD | Admitting: Anesthesiology

## 2015-04-28 ENCOUNTER — Encounter (HOSPITAL_COMMUNITY): Payer: Self-pay | Admitting: Anesthesiology

## 2015-04-28 ENCOUNTER — Encounter (HOSPITAL_COMMUNITY)
Admission: RE | Disposition: A | Discharge status: Home / Self Care | Payer: Self-pay | Source: Ambulatory Visit | Attending: Orthopaedic Surgery

## 2015-04-28 ENCOUNTER — Observation Stay (HOSPITAL_COMMUNITY)
Admission: RE | Admit: 2015-04-28 | Discharge: 2015-04-29 | Disposition: A | Discharge status: Home / Self Care | Payer: BLUE CROSS/BLUE SHIELD | Source: Ambulatory Visit | Attending: Orthopaedic Surgery | Admitting: Orthopaedic Surgery

## 2015-04-28 ENCOUNTER — Ambulatory Visit (HOSPITAL_COMMUNITY): Payer: BLUE CROSS/BLUE SHIELD

## 2015-04-28 DIAGNOSIS — I129 Hypertensive chronic kidney disease with stage 1 through stage 4 chronic kidney disease, or unspecified chronic kidney disease: Secondary | ICD-10-CM | POA: Diagnosis not present

## 2015-04-28 DIAGNOSIS — N189 Chronic kidney disease, unspecified: Secondary | ICD-10-CM | POA: Insufficient documentation

## 2015-04-28 DIAGNOSIS — G4730 Sleep apnea, unspecified: Secondary | ICD-10-CM | POA: Diagnosis not present

## 2015-04-28 DIAGNOSIS — M1990 Unspecified osteoarthritis, unspecified site: Secondary | ICD-10-CM | POA: Insufficient documentation

## 2015-04-28 DIAGNOSIS — M868X7 Other osteomyelitis, ankle and foot: Secondary | ICD-10-CM | POA: Diagnosis not present

## 2015-04-28 DIAGNOSIS — Z888 Allergy status to other drugs, medicaments and biological substances status: Secondary | ICD-10-CM | POA: Diagnosis not present

## 2015-04-28 DIAGNOSIS — Z89619 Acquired absence of unspecified leg above knee: Secondary | ICD-10-CM | POA: Diagnosis not present

## 2015-04-28 DIAGNOSIS — E785 Hyperlipidemia, unspecified: Secondary | ICD-10-CM | POA: Insufficient documentation

## 2015-04-28 DIAGNOSIS — E1152 Type 2 diabetes mellitus with diabetic peripheral angiopathy with gangrene: Secondary | ICD-10-CM | POA: Diagnosis not present

## 2015-04-28 DIAGNOSIS — Z01818 Encounter for other preprocedural examination: Secondary | ICD-10-CM

## 2015-04-28 DIAGNOSIS — I252 Old myocardial infarction: Secondary | ICD-10-CM | POA: Insufficient documentation

## 2015-04-28 HISTORY — PX: AMPUTATION: SHX166

## 2015-04-28 HISTORY — DX: Sleep apnea, unspecified: G47.30

## 2015-04-28 HISTORY — DX: Other specified symptoms and signs involving the circulatory and respiratory systems: R09.89

## 2015-04-28 HISTORY — DX: Unspecified osteoarthritis, unspecified site: M19.90

## 2015-04-28 HISTORY — DX: Pneumonia, unspecified organism: J18.9

## 2015-04-28 HISTORY — DX: Acute myocardial infarction, unspecified: I21.9

## 2015-04-28 LAB — COMPREHENSIVE METABOLIC PANEL
ALT: 40 U/L (ref 17–63)
AST: 37 U/L (ref 15–41)
Albumin: 2.5 g/dL — ABNORMAL LOW (ref 3.5–5.0)
Alkaline Phosphatase: 67 U/L (ref 38–126)
Anion gap: 10 (ref 5–15)
BUN: 25 mg/dL — AB (ref 6–20)
CALCIUM: 8.7 mg/dL — AB (ref 8.9–10.3)
CHLORIDE: 107 mmol/L (ref 101–111)
CO2: 22 mmol/L (ref 22–32)
Creatinine, Ser: 1.61 mg/dL — ABNORMAL HIGH (ref 0.61–1.24)
GFR calc Af Amer: 51 mL/min — ABNORMAL LOW (ref >60)
GFR calc non Af Amer: 44 mL/min — ABNORMAL LOW (ref >60)
Glucose, Bld: 70 mg/dL (ref 65–99)
Potassium: 4.2 mmol/L (ref 3.5–5.1)
Sodium: 139 mmol/L (ref 135–145)
Total Bilirubin: 0.6 mg/dL (ref 0.3–1.2)
Total Protein: 7.8 g/dL (ref 6.5–8.1)

## 2015-04-28 LAB — CBC
HCT: 35.8 % — ABNORMAL LOW (ref 39.0–52.0)
HEMOGLOBIN: 11.9 g/dL — AB (ref 13.0–17.0)
MCH: 29.5 pg (ref 26.0–34.0)
MCHC: 33.2 g/dL (ref 30.0–36.0)
MCV: 88.6 fL (ref 78.0–100.0)
Platelets: 537 10*3/uL — ABNORMAL HIGH (ref 150–400)
RBC: 4.04 MIL/uL — ABNORMAL LOW (ref 4.22–5.81)
RDW: 14 % (ref 11.5–15.5)
WBC: 9.6 10*3/uL (ref 4.0–10.5)

## 2015-04-28 LAB — GLUCOSE, CAPILLARY
GLUCOSE-CAPILLARY: 66 mg/dL (ref 65–99)
GLUCOSE-CAPILLARY: 121 mg/dL — AB (ref 65–99)
GLUCOSE-CAPILLARY: 81 mg/dL (ref 65–99)
GLUCOSE-CAPILLARY: 71 mg/dL (ref 65–99)
GLUCOSE-CAPILLARY: 74 mg/dL (ref 65–99)
Glucose-Capillary: 67 mg/dL (ref 65–99)
Glucose-Capillary: 91 mg/dL (ref 65–99)

## 2015-04-28 SURGERY — AMPUTATION, FOOT, RAY
Anesthesia: General | Site: Foot | Laterality: Right

## 2015-04-28 MED ORDER — DEXTROSE 50 % IV SOLN
25.0000 mL | Freq: Once | INTRAVENOUS | Status: AC
  Administered 2015-04-28: 25 mL via INTRAVENOUS

## 2015-04-28 MED ORDER — CLOPIDOGREL BISULFATE 75 MG PO TABS
75.0000 mg | ORAL_TABLET | Freq: Every day | ORAL | Status: DC
  Administered 2015-04-28 – 2015-04-29 (×2): 75 mg via ORAL
  Filled 2015-04-28 (×2): qty 1

## 2015-04-28 MED ORDER — FENTANYL CITRATE (PF) 250 MCG/5ML IJ SOLN
INTRAMUSCULAR | Status: AC
  Filled 2015-04-28: qty 5

## 2015-04-28 MED ORDER — EXENATIDE 10 MCG/0.04ML ~~LOC~~ SOPN: 10.0000 ug | PEN_INJECTOR | Freq: Two times a day (BID) | SUBCUTANEOUS | Status: DC

## 2015-04-28 MED ORDER — PROPOFOL 10 MG/ML IV BOLUS
INTRAVENOUS | Status: DC | PRN
  Administered 2015-04-28: 200 mg via INTRAVENOUS

## 2015-04-28 MED ORDER — PROPOFOL 10 MG/ML IV BOLUS
INTRAVENOUS | Status: AC
  Filled 2015-04-28: qty 20

## 2015-04-28 MED ORDER — ROSUVASTATIN CALCIUM 10 MG PO TABS
20.0000 mg | ORAL_TABLET | Freq: Every day | ORAL | Status: DC
  Administered 2015-04-28 – 2015-04-29 (×2): 20 mg via ORAL
  Filled 2015-04-28 (×2): qty 2

## 2015-04-28 MED ORDER — MIDAZOLAM HCL 2 MG/2ML IJ SOLN
INTRAMUSCULAR | Status: AC
  Filled 2015-04-28: qty 2

## 2015-04-28 MED ORDER — ONDANSETRON HCL 4 MG/2ML IJ SOLN: 4.0000 mg | Freq: Four times a day (QID) | INTRAMUSCULAR | Status: DC | PRN

## 2015-04-28 MED ORDER — CEFAZOLIN SODIUM 1-5 GM-% IV SOLN
1.0000 g | Freq: Three times a day (TID) | INTRAVENOUS | Status: DC
  Administered 2015-04-28 – 2015-04-29 (×2): 1 g via INTRAVENOUS
  Filled 2015-04-28 (×3): qty 50

## 2015-04-28 MED ORDER — DOCUSATE SODIUM 100 MG PO CAPS
100.0000 mg | ORAL_CAPSULE | Freq: Two times a day (BID) | ORAL | Status: DC
  Administered 2015-04-29: 100 mg via ORAL
  Filled 2015-04-28 (×2): qty 1

## 2015-04-28 MED ORDER — ONDANSETRON HCL 4 MG/2ML IJ SOLN
INTRAMUSCULAR | Status: AC
  Filled 2015-04-28: qty 2

## 2015-04-28 MED ORDER — OXYCODONE HCL 5 MG PO TABS
5.0000 mg | ORAL_TABLET | ORAL | Status: DC | PRN
  Administered 2015-04-29: 5 mg via ORAL
  Filled 2015-04-28 (×2): qty 1

## 2015-04-28 MED ORDER — ACETAMINOPHEN 650 MG RE SUPP: 650.0000 mg | Freq: Four times a day (QID) | RECTAL | Status: DC | PRN

## 2015-04-28 MED ORDER — FUROSEMIDE 40 MG PO TABS
40.0000 mg | ORAL_TABLET | Freq: Every day | ORAL | Status: DC
  Administered 2015-04-28 – 2015-04-29 (×2): 40 mg via ORAL
  Filled 2015-04-28 (×2): qty 1

## 2015-04-28 MED ORDER — HYDROMORPHONE HCL 1 MG/ML IJ SOLN: 0.2500 mg | INTRAMUSCULAR | Status: DC | PRN

## 2015-04-28 MED ORDER — SODIUM CHLORIDE 0.9 % IV SOLN
INTRAVENOUS | Status: DC
  Administered 2015-04-28 (×3): via INTRAVENOUS

## 2015-04-28 MED ORDER — CEFAZOLIN SODIUM-DEXTROSE 2-3 GM-% IV SOLR
2.0000 g | INTRAVENOUS | Status: AC
  Administered 2015-04-28: 2 g via INTRAVENOUS

## 2015-04-28 MED ORDER — INSULIN GLARGINE 100 UNIT/ML ~~LOC~~ SOLN
34.0000 [IU] | Freq: Every day | SUBCUTANEOUS | Status: DC
  Administered 2015-04-28: 34 [IU] via SUBCUTANEOUS
  Filled 2015-04-28 (×2): qty 0.34

## 2015-04-28 MED ORDER — CILOSTAZOL 100 MG PO TABS
100.0000 mg | ORAL_TABLET | Freq: Two times a day (BID) | ORAL | Status: DC
  Administered 2015-04-28 – 2015-04-29 (×2): 100 mg via ORAL
  Filled 2015-04-28 (×3): qty 1

## 2015-04-28 MED ORDER — FENTANYL CITRATE (PF) 100 MCG/2ML IJ SOLN
INTRAMUSCULAR | Status: DC | PRN
  Administered 2015-04-28: 25 ug via INTRAVENOUS
  Administered 2015-04-28 (×2): 50 ug via INTRAVENOUS
  Administered 2015-04-28: 25 ug via INTRAVENOUS

## 2015-04-28 MED ORDER — METOCLOPRAMIDE HCL 5 MG PO TABS: 5.0000 mg | ORAL_TABLET | Freq: Three times a day (TID) | ORAL | Status: DC | PRN

## 2015-04-28 MED ORDER — METOCLOPRAMIDE HCL 5 MG/ML IJ SOLN: 5.0000 mg | Freq: Three times a day (TID) | INTRAMUSCULAR | Status: DC | PRN

## 2015-04-28 MED ORDER — ACETAMINOPHEN 325 MG PO TABS: 650.0000 mg | ORAL_TABLET | Freq: Four times a day (QID) | ORAL | Status: DC | PRN

## 2015-04-28 MED ORDER — ATENOLOL 50 MG PO TABS
25.0000 mg | ORAL_TABLET | Freq: Every day | ORAL | Status: DC
  Administered 2015-04-29: 25 mg via ORAL
  Filled 2015-04-28 (×2): qty 1

## 2015-04-28 MED ORDER — DEXTROSE 50 % IV SOLN
INTRAVENOUS | Status: AC
  Filled 2015-04-28: qty 50

## 2015-04-28 MED ORDER — POTASSIUM CHLORIDE IN NACL 20-0.45 MEQ/L-% IV SOLN
INTRAVENOUS | Status: DC
  Administered 2015-04-28: 75 mL/h via INTRAVENOUS
  Filled 2015-04-28 (×3): qty 1000

## 2015-04-28 MED ORDER — INSULIN ASPART 100 UNIT/ML ~~LOC~~ SOLN: 3.0000 [IU] | Freq: Three times a day (TID) | SUBCUTANEOUS | Status: DC | PRN

## 2015-04-28 MED ORDER — INSULIN ASPART 100 UNIT/ML ~~LOC~~ SOLN
0.0000 [IU] | Freq: Three times a day (TID) | SUBCUTANEOUS | Status: DC
  Administered 2015-04-29: 4 [IU] via SUBCUTANEOUS

## 2015-04-28 MED ORDER — HYDROMORPHONE HCL 1 MG/ML IJ SOLN: 1.0000 mg | INTRAMUSCULAR | Status: DC | PRN

## 2015-04-28 MED ORDER — ONDANSETRON HCL 4 MG PO TABS: 4.0000 mg | ORAL_TABLET | Freq: Four times a day (QID) | ORAL | Status: DC | PRN

## 2015-04-28 MED ORDER — PROMETHAZINE HCL 25 MG/ML IJ SOLN: 6.2500 mg | INTRAMUSCULAR | Status: DC | PRN

## 2015-04-28 MED ORDER — LIDOCAINE HCL (CARDIAC) 20 MG/ML IV SOLN
INTRAVENOUS | Status: DC | PRN
  Administered 2015-04-28: 80 mg via INTRAVENOUS

## 2015-04-28 MED ORDER — CEFAZOLIN SODIUM-DEXTROSE 2-3 GM-% IV SOLR
INTRAVENOUS | Status: AC
  Filled 2015-04-28: qty 50

## 2015-04-28 MED ORDER — PHENYLEPHRINE HCL 10 MG/ML IJ SOLN
INTRAMUSCULAR | Status: DC | PRN
  Administered 2015-04-28 (×6): 40 ug via INTRAVENOUS

## 2015-04-28 MED ORDER — ONDANSETRON HCL 4 MG/2ML IJ SOLN
INTRAMUSCULAR | Status: DC | PRN
  Administered 2015-04-28: 4 mg via INTRAVENOUS

## 2015-04-28 MED ORDER — MIDAZOLAM HCL 5 MG/5ML IJ SOLN
INTRAMUSCULAR | Status: DC | PRN
  Administered 2015-04-28: 2 mg via INTRAVENOUS

## 2015-04-28 SURGICAL SUPPLY — 43 items
BANDAGE ELASTIC 6 VELCRO ST LF (GAUZE/BANDAGES/DRESSINGS) ×3 IMPLANT
BANDAGE ESMARK 6X9 LF (GAUZE/BANDAGES/DRESSINGS) IMPLANT
BLADE AVERAGE 25MMX9MM (BLADE) ×1
BLADE AVERAGE 25X9 (BLADE) ×2 IMPLANT
BNDG CMPR 9X6 STRL LF SNTH (GAUZE/BANDAGES/DRESSINGS)
BNDG COHESIVE 4X5 TAN STRL (GAUZE/BANDAGES/DRESSINGS) ×3 IMPLANT
BNDG ESMARK 6X9 LF (GAUZE/BANDAGES/DRESSINGS)
BNDG GAUZE ELAST 4 BULKY (GAUZE/BANDAGES/DRESSINGS) ×2 IMPLANT
CUFF TOURNIQUET SINGLE 24IN (TOURNIQUET CUFF) ×2 IMPLANT
CUFF TOURNIQUET SINGLE 34IN LL (TOURNIQUET CUFF) IMPLANT
CUFF TOURNIQUET SINGLE 44IN (TOURNIQUET CUFF) IMPLANT
DRAPE U-SHAPE 47X51 STRL (DRAPES) ×6 IMPLANT
DRSG ADAPTIC 3X8 NADH LF (GAUZE/BANDAGES/DRESSINGS) ×3 IMPLANT
DRSG PAD ABDOMINAL 8X10 ST (GAUZE/BANDAGES/DRESSINGS) ×2 IMPLANT
DURAPREP 26ML APPLICATOR (WOUND CARE) ×3 IMPLANT
ELECT REM PT RETURN 9FT ADLT (ELECTROSURGICAL) ×3
ELECTRODE REM PT RTRN 9FT ADLT (ELECTROSURGICAL) ×1 IMPLANT
GAUZE SPONGE 4X4 12PLY STRL (GAUZE/BANDAGES/DRESSINGS) ×3 IMPLANT
GLOVE BIOGEL PI IND STRL 8 (GLOVE) ×1 IMPLANT
GLOVE BIOGEL PI INDICATOR 8 (GLOVE) ×2
GLOVE ORTHO TXT STRL SZ7.5 (GLOVE) ×3 IMPLANT
GOWN STRL REUS W/ TWL LRG LVL3 (GOWN DISPOSABLE) ×2 IMPLANT
GOWN STRL REUS W/TWL 2XL LVL3 (GOWN DISPOSABLE) ×3 IMPLANT
GOWN STRL REUS W/TWL LRG LVL3 (GOWN DISPOSABLE) ×6
KIT BASIN OR (CUSTOM PROCEDURE TRAY) ×3 IMPLANT
KIT ROOM TURNOVER OR (KITS) ×3 IMPLANT
MANIFOLD NEPTUNE II (INSTRUMENTS) ×3 IMPLANT
NS IRRIG 1000ML POUR BTL (IV SOLUTION) ×3 IMPLANT
PACK ORTHO EXTREMITY (CUSTOM PROCEDURE TRAY) ×3 IMPLANT
PAD ARMBOARD 7.5X6 YLW CONV (MISCELLANEOUS) ×6 IMPLANT
PAD CAST 4YDX4 CTTN HI CHSV (CAST SUPPLIES) ×1 IMPLANT
PADDING CAST COTTON 4X4 STRL (CAST SUPPLIES) ×3
SPONGE LAP 18X18 X RAY DECT (DISPOSABLE) ×3 IMPLANT
STAPLER VISISTAT 35W (STAPLE) ×3 IMPLANT
STOCKINETTE IMPERVIOUS LG (DRAPES) IMPLANT
SUCTION FRAZIER TIP 10 FR DISP (SUCTIONS) ×3 IMPLANT
SUT ETHILON 2 0 PSLX (SUTURE) ×8 IMPLANT
TOWEL OR 17X24 6PK STRL BLUE (TOWEL DISPOSABLE) ×3 IMPLANT
TOWEL OR 17X26 10 PK STRL BLUE (TOWEL DISPOSABLE) ×3 IMPLANT
TUBE CONNECTING 12'X1/4 (SUCTIONS) ×1
TUBE CONNECTING 12X1/4 (SUCTIONS) ×2 IMPLANT
UNDERPAD 30X30 INCONTINENT (UNDERPADS AND DIAPERS) ×3 IMPLANT
WATER STERILE IRR 1000ML POUR (IV SOLUTION) ×3 IMPLANT

## 2015-04-28 NOTE — Brief Op Note (Signed)
04/28/2015  7:20 PM  PATIENT:  [name]  64 y.o. male  PRE-OPERATIVE DIAGNOSIS:  RIGHT 5TH TOE NECROSIS  POST-OPERATIVE DIAGNOSIS:  RIGHT 5TH TOE NECROSIS  PROCEDURE:  Procedure(s): AMPUTATION RAY, RIGHT 5TH TOE (Right)  SURGEON:  Surgeon(s) and Role:    * Marybelle Killings, MD - Primary  PHYSICIAN ASSISTANT: Norleen Xie m. Ricard Dillon  ANESTHESIA:   general  EBL:     BLOOD ADMINISTERED:none  DRAINS: none   LOCAL MEDICATIONS USED:  NONE  SPECIMEN:  Right 5th toe DISPOSITION OF SPECIMEN:  PATHOLOGY  COUNTS:  YES  TOURNIQUET:   Total Tourniquet Time Documented: Calf (Right) - 8 minutes Total: Calf (Right) - 8 minutes     PATIENT DISPOSITION:  PACU - hemodynamically stable.

## 2015-04-28 NOTE — Anesthesia Procedure Notes (Signed)
Procedure Name: Intubation Date/Time: 04/28/2015 6:35 PM Performed by: Suzy Bouchard Pre-anesthesia Checklist: Patient identified, Emergency Drugs available, Suction available, Patient being monitored and Timeout performed Patient Re-evaluated:Patient Re-evaluated prior to inductionOxygen Delivery Method: Circle system utilized Preoxygenation: Pre-oxygenation with 100% oxygen Intubation Type: IV induction Ventilation: Mask ventilation without difficulty LMA: LMA inserted LMA Size: 5.0 Number of attempts: 1 Placement Confirmation: positive ETCO2 and breath sounds checked- equal and bilateral Tube secured with: Tape Dental Injury: Teeth and Oropharynx as per pre-operative assessment

## 2015-04-28 NOTE — Progress Notes (Signed)
Pharmacy informed RN that they do not carry Byetta pin. Patient states that family will bring Byetta pin to hospital tomorrow. Nursing will continue to monitor.

## 2015-04-28 NOTE — Progress Notes (Signed)
CBG on admission 69.  Pt states that he was 74 when he woke up and he drank a glass of gatorade.   Denies symptoms of hypoglycemia at thsi time.  Dr Tobias Alexander notified and no orders given. Rechecked sugar at 1615 and it was 67.  Pt states his vision is blurring.  Dr Tobias Alexander notified and orders received and given.

## 2015-04-28 NOTE — Anesthesia Postprocedure Evaluation (Signed)
  Anesthesia Post-op Note  Patient: [name]  Procedure(s) Performed: Procedure(s): AMPUTATION RAY, RIGHT 5TH TOE (Right)  Patient Location: PACU  Anesthesia Type:General  Level of Consciousness: awake  Airway and Oxygen Therapy: Patient Spontanous Breathing  Post-op Pain: mild  Post-op Assessment: Post-op Vital signs reviewed              Post-op Vital Signs: Reviewed  Last Vitals:  Filed Vitals:   04/28/15 2036  BP: 124/85  Pulse: 74  Temp: 36.6 C  Resp: 18    Complications: No apparent anesthesia complications

## 2015-04-28 NOTE — Interval H&P Note (Signed)
History and Physical Interval Note:  04/28/2015 3:24 PM  [name]  has presented today for surgery, with the diagnosis of RIGHT 5TH TOE NECROSIS  The various methods of treatment have been discussed with the patient and family. After consideration of risks, benefits and other options for treatment, the patient has consented to  Procedure(s): AMPUTATION RAY, RIGHT 5TH TOE (Right) as a surgical intervention .  The patient's history has been reviewed, patient examined, no change in status, stable for surgery.  I have reviewed the patient's chart and labs.  Questions were answered to the patient's satisfaction.     Steadman Prosperi C

## 2015-04-28 NOTE — Transfer of Care (Signed)
Immediate Anesthesia Transfer of Care Note  Patient: [name]  Procedure(s) Performed: Procedure(s): AMPUTATION RAY, RIGHT 5TH TOE (Right)  Patient Location: PACU  Anesthesia Type:General  Level of Consciousness: awake  Airway & Oxygen Therapy: Patient Spontanous Breathing and Patient connected to nasal cannula oxygen  Post-op Assessment: Report given to RN and Post -op Vital signs reviewed and stable  Post vital signs: Reviewed and stable  Last Vitals:  Filed Vitals:   04/28/15 1925  BP: 132/83  Pulse: 69  Temp: 36.9 C  Resp: 17    Complications: No apparent anesthesia complications

## 2015-04-28 NOTE — Anesthesia Preprocedure Evaluation (Signed)
Anesthesia Evaluation  Patient identified by MRN, date of birth, ID band Patient awake    Reviewed: Allergy & Precautions, NPO status , Patient's Chart, lab work & pertinent test results  History of Anesthesia Complications Negative for: history of anesthetic complications  Airway Mallampati: I  TM Distance: >3 FB Neck ROM: Full    Dental  (+) Edentulous Upper, Missing, Dental Advisory Given   Pulmonary sleep apnea , pneumonia -,    Pulmonary exam normal       Cardiovascular hypertension, + Past MI and + Peripheral Vascular Disease Normal cardiovascular exam    Neuro/Psych TIAnegative psych ROS   GI/Hepatic negative GI ROS, Neg liver ROS,   Endo/Other  diabetes  Renal/GU Renal InsufficiencyRenal disease     Musculoskeletal   Abdominal   Peds  Hematology   Anesthesia Other Findings   Reproductive/Obstetrics                             Anesthesia Physical Anesthesia Plan  ASA: III  Anesthesia Plan: General   Post-op Pain Management:    Induction: Intravenous  Airway Management Planned: LMA  Additional Equipment:   Intra-op Plan:   Post-operative Plan: Extubation in OR  Informed Consent: I have reviewed the patients History and Physical, chart, labs and discussed the procedure including the risks, benefits and alternatives for the proposed anesthesia with the patient or authorized representative who has indicated his/her understanding and acceptance.   Dental advisory given  Plan Discussed with: CRNA, Anesthesiologist and Surgeon  Anesthesia Plan Comments:         Anesthesia Quick Evaluation

## 2015-04-28 NOTE — H&P (Signed)
[name] is an 64 y.o. male.   Chief Complaint: right 5th toe infection HPI: patient presented to our office with the above complaint.  Progressively worsening infection that has failed conservative management.  Pain and drainage from wound.   Past Medical History  Diagnosis Date  . Hypertension   . Hyperlipidemia   . Necrosis     #2 nail   . CKD (chronic kidney disease) 06/28/2014  . Myocardial infarction     "mild" heart attack  . Poor circulation of extremity   . Sleep apnea     uses cpap, getting a new one  . Pneumonia   . Type 2 Diabetes mellitus     Type 2  . Arthritis     Past Surgical History  Procedure Laterality Date  . Above knee leg amputation  2004  . Cataract extraction w/phaco  09/05/2012    Procedure: CATARACT EXTRACTION PHACO AND INTRAOCULAR LENS PLACEMENT (IOC);  Surgeon: Tonny Branch, MD;  Location: [facility];  Service: Ophthalmology;  Laterality: Left;  CDE=5.45  . Cataract extraction w/phaco  10/03/2012    Procedure: CATARACT EXTRACTION PHACO AND INTRAOCULAR LENS PLACEMENT (IOC);  Surgeon: Tonny Branch, MD;  Location: [facility];  Service: Ophthalmology;  Laterality: Right;  CDE: 12.31  . Colonoscopy      Family History  Problem Relation Age of Onset  . Diabetes Mother   . Hypertension Mother   . Cancer Father   . Cancer Sister   . Sickle cell anemia Daughter    Social History:  reports that he has never smoked. He has never used smokeless tobacco. He reports that he does not drink alcohol or use illicit drugs.  Allergies:  Allergies  Allergen Reactions  . Zolpidem Tartrate Other (See Comments)    disorientation     No prescriptions prior to admission    Results for orders placed or performed during the hospital encounter of 04/26/15 (from the past 48 hour(s))  CBC     Status: Abnormal   Collection Time: 04/26/15  2:54 PM  Result Value Ref Range   WBC 9.4 4.0 - 10.5 K/uL   RBC 3.90 (L) 4.22 - 5.81 MIL/uL   Hemoglobin 11.5 (L) 13.0 - 17.0 g/dL   HCT 33.9 (L) 39.0 - 52.0 %   MCV 86.9 78.0 - 100.0 fL   MCH 29.5 26.0 - 34.0 pg   MCHC 33.9 30.0 - 36.0 g/dL   RDW 14.0 11.5 - 15.5 %   Platelets 594 (H) 150 - 400 K/uL  Comprehensive metabolic panel     Status: Abnormal   Collection Time: 04/26/15  2:54 PM  Result Value Ref Range   Sodium 137 135 - 145 mmol/L   Potassium 4.1 3.5 - 5.1 mmol/L   Chloride 105 101 - 111 mmol/L   CO2 22 22 - 32 mmol/L   Glucose, Bld 89 65 - 99 mg/dL   BUN 42 (H) 6 - 20 mg/dL   Creatinine, Ser 1.71 (H) 0.61 - 1.24 mg/dL   Calcium 8.9 8.9 - 10.3 mg/dL   Total Protein 7.4 6.5 - 8.1 g/dL   Albumin 2.5 (L) 3.5 - 5.0 g/dL   AST 35 15 - 41 U/L   ALT 34 17 - 63 U/L   Alkaline Phosphatase 75 38 - 126 U/L   Total Bilirubin 0.3 0.3 - 1.2 mg/dL   GFR calc non Af Amer 41 (L) >60 mL/min   GFR calc Af Amer 48 (L) >60 mL/min  Comment: (NOTE) The eGFR has been calculated using the CKD EPI equation. This calculation has not been validated in all clinical situations. eGFR's persistently <60 mL/min signify possible Chronic Kidney Disease.    Anion gap 10 5 - 15  CBG monitoring, ED     Status: Abnormal   Collection Time: 04/26/15  6:10 PM  Result Value Ref Range   Glucose-Capillary 106 (H) 65 - 99 mg/dL   No results found.  Review of Systems  Constitutional: Negative.   HENT: Negative.   Eyes: Negative.   Respiratory: Positive for cough.   Cardiovascular: Negative.   Gastrointestinal: Negative.   Genitourinary: Negative.   Musculoskeletal: Positive for joint pain.  Neurological: Negative.   Psychiatric/Behavioral: Negative.     There were no vitals taken for this visit. Physical Exam  Constitutional: He is oriented to person, place, and time. He appears well-developed.  HENT:  Head: Normocephalic and atraumatic.  Eyes: EOM are normal. Pupils are equal, round, and reactive to light.  Neck: Normal range of motion.  Cardiovascular: Normal rate and regular rhythm.   Respiratory: Effort normal and  breath sounds normal.  GI: Soft. He exhibits no distension.  Neurological: He is alert and oriented to person, place, and time.  Skin: Skin is warm and dry.  Psychiatric: He has a normal mood and affect.     MRI REPORT; IMPRESSION: Fractures of the proximal and distal aspects of proximal phalanx of the little toe with an overlying soft tissue ulceration. Adjacent fluid in the soft tissues could represent infection. I suspect the patient has osteomyelitis of the distal aspect of the proximal Phalanx.   Assessment/Plan Right fifth toe infection/osteomyelitis Will proceed with fifth ray amputation as scheduled.  Surgical procedure along with possible risks and complications discussed.  All questions answered.    OWENS,JAMES M 04/28/2015, 11:45 AM

## 2015-04-29 ENCOUNTER — Encounter (HOSPITAL_COMMUNITY): Payer: Self-pay | Admitting: Orthopaedic Surgery

## 2015-04-29 ENCOUNTER — Ambulatory Visit: Payer: BLUE CROSS/BLUE SHIELD | Admitting: Family

## 2015-04-29 DIAGNOSIS — M868X7 Other osteomyelitis, ankle and foot: Secondary | ICD-10-CM | POA: Diagnosis not present

## 2015-04-29 LAB — BASIC METABOLIC PANEL
ANION GAP: 9 (ref 5–15)
BUN: 21 mg/dL — AB (ref 6–20)
CO2: 24 mmol/L (ref 22–32)
Calcium: 8.2 mg/dL — ABNORMAL LOW (ref 8.9–10.3)
Chloride: 105 mmol/L (ref 101–111)
Creatinine, Ser: 1.54 mg/dL — ABNORMAL HIGH (ref 0.61–1.24)
GFR calc Af Amer: 54 mL/min — ABNORMAL LOW (ref >60)
GFR calc non Af Amer: 47 mL/min — ABNORMAL LOW (ref >60)
Glucose, Bld: 102 mg/dL — ABNORMAL HIGH (ref 65–99)
POTASSIUM: 4.3 mmol/L (ref 3.5–5.1)
Sodium: 138 mmol/L (ref 135–145)

## 2015-04-29 LAB — GLUCOSE, CAPILLARY
GLUCOSE-CAPILLARY: 115 mg/dL — AB (ref 65–99)
Glucose-Capillary: 194 mg/dL — ABNORMAL HIGH (ref 65–99)

## 2015-04-29 MED ORDER — OXYCODONE-ACETAMINOPHEN 5-325 MG PO TABS: 1.0000 | ORAL_TABLET | Freq: Four times a day (QID) | ORAL | Status: DC | PRN

## 2015-04-29 NOTE — Op Note (Signed)
NAMEYOSMAR, JOHNES NO.:  [id number]  MEDICAL RECORD NO.:  [id number]  LOCATION:  5N29C                        FACILITY:  [facility]  PHYSICIAN:  Shalandra Leu C. Lorin Mercy, M.D.    DATE OF BIRTH:  09-27-1951  DATE OF PROCEDURE:  04/28/2015 DATE OF DISCHARGE:                              OPERATIVE REPORT   PREOPERATIVE DIAGNOSIS:  Right fifth toe necrosis with osteomyelitis.  POSTOPERATIVE DIAGNOSIS:  Right fifth toe necrosis with osteomyelitis.  PROCEDURE:  Right fifth ray amputation.  SURGEON:  Kadience Macchi C. Lorin Mercy, M.D.  ASSISTANT:  Benjiman Core PA-C, present for the entire procedure.  ANESTHESIA:  General.  TOURNIQUET TIME:  7 minutes.  INDICATION:  A 64 year old male with peripheral arterial disease, diabetes, small vessel disease with satisfactory Dopplers, calcified vessels, has had previous AKA on the left leg after starting with foot necrosis, BKA, etc.  This is the first problem with his right foot, and he had 5th toe necrosis with fractures, osteomyelitis, and the tip was gangrenous with exposed tendons.  DESCRIPTION OF PROCEDURE:  After induction of anesthesia, prepping and draping, with calf tourniquet, leg was elevated, tourniquet inflated, racket-handle incision was made after time-out procedure was completed. A 2 g Ancef was given prophylactically.  Incision was made down to the proximal third of the metatarsal, which was divided at an angle with an oscillating saw and then using 10 scalpel blade, the amputation was completed.  The ray was removed for pathology.  Tourniquet was deflated. There was minimal skin bleeding, some duskiness of the tissue, no purulence at this level.  Flexor and extensors were cut back on stretch. The common digital artery was not bleeding.  There was some bleeding at the skin edges but poor to fair.  After copious irrigation, a blood blister was unearthed on the plantar surface of his foot.  Some of the dead skin was peeled around  the edges and then layered closure with 2-0 nylon.  Postop dressing was applied.  The patient was transferred to the recovery room.     Rayne Cowdrey C. Lorin Mercy, M.D.    MCY/MEDQ  D:  04/28/2015  T:  04/29/2015  Job:  [id number]

## 2015-04-29 NOTE — Progress Notes (Signed)
Subjective: Doing well.  Pain controlled.   Ready to go home.   Objective: Vital signs in last 24 hours: Temp:  [97.9 F (36.6 C)-98.5 F (36.9 C)] 98.2 F (36.8 C) (06/30 0558) Pulse Rate:  [66-74] 68 (06/30 0558) Resp:  [16-25] 17 (06/30 0558) BP: (100-143)/(63-86) 100/63 mmHg (06/30 0558) SpO2:  [98 %-100 %] 99 % (06/30 0558) Weight:  [111.299 kg (245 lb 5.9 oz)] 111.299 kg (245 lb 5.9 oz) (06/29 1543)  Intake/Output from previous day: 06/29 0701 - 06/30 0700 In: 920 [P.O.:220; I.V.:700] Out: 1650 [Urine:1600; Blood:50] Intake/Output this shift:     Recent Labs  04/26/15 1454 04/28/15 1539  HGB 11.5* 11.9*    Recent Labs  04/26/15 1454 04/28/15 1539  WBC 9.4 9.6  RBC 3.90* 4.04*  HCT 33.9* 35.8*  PLT 594* 537*    Recent Labs  04/28/15 1539 04/29/15 0404  NA 139 138  K 4.2 4.3  CL 107 105  CO2 22 24  BUN 25* 21*  CREATININE 1.61* 1.54*  GLUCOSE 70 102*  CALCIUM 8.7* 8.2*   No results for input(s): LABPT, INR in the last 72 hours.  Exam:  Dressing c/d/i.  Alert and oriented.   Assessment/Plan: D/c home today.  F/u in office one week.    Laden Fieldhouse M 04/29/2015, 8:03 AM

## 2015-04-29 NOTE — Evaluation (Signed)
Physical Therapy Evaluation Patient Details Name: [name] MRN: [id number] DOB: Mar 03, 1951 Today's Date: 04/29/2015   History of Present Illness  Pt is a 64 y/o M s/p R 5th ray amputation.  Pt's PMH includes HTN, CKD, MI, DMII, L AKA.  Clinical Impression  Patient is s/p above surgery resulting in functional limitations due to the deficits listed below (see PT Problem List). Pt demonstrated ability to ambulate w/ RW in room, limited by pain in wrist from IV this session as pt requested to sit down rather than ambulating in hallway.  Pt will have assist 24/7 from wife at home.  Pt is currently borrowing a friend's WC and will need one before returning home for long distances.  Patient will benefit from skilled PT to increase their independence and safety with mobility to allow discharge to the venue listed below.      Follow Up Recommendations No PT follow up;Supervision for mobility/OOB    Equipment Recommendations  Rolling walker with 5" wheels;Wheelchair (measurements PT);Wheelchair cushion (measurements PT)    Recommendations for Other Services       Precautions / Restrictions Precautions Precautions: Fall Precaution Comments: L AKA Required Braces or Orthoses: Other Brace/Splint Other Brace/Splint: Darco postop heel wedge shoe Restrictions Weight Bearing Restrictions:  (No specific order, pt says WBAT)      Mobility  Bed Mobility Overal bed mobility: Modified Independent             General bed mobility comments: Use of bed rail, no assist required  Transfers Overall transfer level: Needs assistance Equipment used: Rolling walker (2 wheeled) Transfers: Sit to/from Stand Sit to Stand: Supervision         General transfer comment: Supervision for safety after pt donned L AKA prosthesis and R darco shoe.  Good technique, pt has h/o using RW at home.  Ambulation/Gait Ambulation/Gait assistance: Min guard Ambulation Distance (Feet): 20 Feet Assistive device:  Rolling walker (2 wheeled) Gait Pattern/deviations: Step-to pattern;Antalgic;Decreased stride length   Gait velocity interpretation: Below normal speed for age/gender General Gait Details: VCs to place weight through R heel.  Hip hike on L.  Stairs            Wheelchair Mobility    Modified Rankin (Stroke Patients Only)       Balance Overall balance assessment: Needs assistance Sitting-balance support: No upper extremity supported;Feet supported Sitting balance-Leahy Scale: Good     Standing balance support: Bilateral upper extremity supported;During functional activity Standing balance-Leahy Scale: Fair                               Pertinent Vitals/Pain Pain Assessment: 0-10 Pain Score: 1  Pain Location: R foot Pain Descriptors / Indicators: Aching Pain Intervention(s): Limited activity within patient's tolerance;Monitored during session;Repositioned    Home Living Family/patient expects to be discharged to:: Private residence Living Arrangements: Spouse/significant other Available Help at Discharge: Family;Available 24 hours/day (wife) Type of Home: House Home Access: Ramped entrance     Home Layout: One level Home Equipment: [place] - manual (WC is borrowed)      Prior Function Level of Independence: Independent with assistive device(s)         Comments: WC for long distances Ind, cane at all other times except RW at night after doffed prosthesis     Hand Dominance   Dominant Hand: Right    Extremity/Trunk Assessment  Lower Extremity Assessment: RLE deficits/detail;LLE deficits/detail RLE Deficits / Details: weakness and limited ROM as expected s/p R 5th toe ray amputation LLE Deficits / Details: previous L AKA     Communication   Communication: No difficulties  Cognition Arousal/Alertness: Awake/alert Behavior During Therapy: WFL for tasks assessed/performed Overall Cognitive Status: Within  Functional Limits for tasks assessed                      General Comments      Exercises General Exercises - Lower Extremity Quad Sets: AROM;Both;5 reps;Supine Straight Leg Raises: AROM;Right;5 reps;Supine      Assessment/Plan    PT Assessment Patient needs continued PT services  PT Diagnosis Difficulty walking;Abnormality of gait;Generalized weakness;Acute pain   PT Problem List Decreased strength;Decreased range of motion;Decreased activity tolerance;Decreased balance;Decreased mobility;Decreased coordination;Decreased knowledge of use of DME;Decreased safety awareness;Decreased knowledge of precautions;Decreased skin integrity;Pain  PT Treatment Interventions DME instruction;Gait training;Stair training;Functional mobility training;Therapeutic activities;Therapeutic exercise;Balance training;Neuromuscular re-education;Patient/family education;Modalities;Wheelchair mobility training   PT Goals (Current goals can be found in the Care Plan section) Acute Rehab PT Goals Patient Stated Goal: to go home PT Goal Formulation: With patient/family Time For Goal Achievement: 05/06/15 Potential to Achieve Goals: Good    Frequency Min 2X/week   Barriers to discharge        Co-evaluation               End of Session Equipment Utilized During Treatment: Gait belt;Other (comment) (L prosthesis and R darco shoe) Activity Tolerance: Patient tolerated treatment well (pt c/o IV at wrist causing pain) Patient left: in chair;with call bell/phone within reach;with family/visitor present Nurse Communication: Mobility status;Precautions    Functional Assessment Tool Used: Clinical Judgement Functional Limitation: Mobility: Walking and moving around Mobility: Walking and Moving Around Current Status [id number]): At least 20 percent but less than 40 percent impaired, limited or restricted Mobility: Walking and Moving Around Goal Status [id number]): At least 1 percent but less than 20  percent impaired, limited or restricted    Time: 1113-1135 PT Time Calculation (min) (ACUTE ONLY): 22 min   Charges:   PT Evaluation $Initial PT Evaluation Tier I: 1 Procedure     PT G Codes:   PT G-Codes **NOT FOR INPATIENT CLASS** Functional Assessment Tool Used: Clinical Judgement Functional Limitation: Mobility: Walking and moving around Mobility: Walking and Moving Around Current Status [id number]): At least 20 percent but less than 40 percent impaired, limited or restricted Mobility: Walking and Moving Around Goal Status [phone]): At least 1 percent but less than 20 percent impaired, limited or restricted   Joslyn Hy PT, DPT [phone] Pager: [phone] 04/29/2015, 12:31 PM

## 2015-05-04 ENCOUNTER — Encounter: Payer: Self-pay | Admitting: Family

## 2015-05-04 ENCOUNTER — Ambulatory Visit (INDEPENDENT_AMBULATORY_CARE_PROVIDER_SITE_OTHER): Payer: BLUE CROSS/BLUE SHIELD | Admitting: Family

## 2015-05-04 VITALS — BP 107/68 | HR 76 | Temp 98.3°F

## 2015-05-04 DIAGNOSIS — J069 Acute upper respiratory infection, unspecified: Secondary | ICD-10-CM

## 2015-05-04 MED ORDER — METHYLPREDNISOLONE ACETATE 80 MG/ML IJ SUSP
40.0000 mg | Freq: Once | INTRAMUSCULAR | Status: AC
  Administered 2015-05-04: 40 mg via INTRAMUSCULAR

## 2015-05-04 MED ORDER — METHYLPREDNISOLONE ACETATE 40 MG/ML IJ SUSP: 40.0000 mg | Freq: Once | INTRAMUSCULAR | Status: DC

## 2015-05-04 MED ORDER — BENZONATATE 200 MG PO CAPS: 200.0000 mg | ORAL_CAPSULE | Freq: Three times a day (TID) | ORAL | Status: DC | PRN

## 2015-05-04 MED ORDER — HYDROCODONE-HOMATROPINE 5-1.5 MG/5ML PO SYRP: 5.0000 mL | ORAL_SOLUTION | Freq: Three times a day (TID) | ORAL | Status: DC | PRN

## 2015-05-04 NOTE — Progress Notes (Signed)
   Subjective:    Patient ID: [name], male    DOB: [birth year], 64 y.o.   MRN: [id number]  Cough This is a new problem. The current episode started 1 to 4 weeks ago. The problem has been gradually worsening. The problem occurs every few minutes. The cough is non-productive. Associated symptoms include a fever. Pertinent negatives include no chills, ear congestion, ear pain, hemoptysis, myalgias, nasal congestion, postnasal drip, rhinorrhea, sore throat, shortness of breath or wheezing. The symptoms are aggravated by lying down. Treatments tried: Tussinex. There is no history of asthma or COPD.      Review of Systems  Constitutional: Positive for fever. Negative for chills.  HENT: Negative for ear pain, postnasal drip, rhinorrhea and sore throat.   Respiratory: Positive for cough. Negative for hemoptysis, shortness of breath and wheezing.   Cardiovascular: Negative.   Gastrointestinal: Negative.   Endocrine: Negative.   Genitourinary: Negative.   Musculoskeletal: Negative.  Negative for myalgias.  Hematological: Negative.   Psychiatric/Behavioral: Negative.   All other systems reviewed and are negative.      Objective:   Physical Exam  Constitutional: He is oriented to person, place, and time. He appears well-developed and well-nourished. No distress.  HENT:  Head: Normocephalic.  Right Ear: External ear normal.  Left Ear: External ear normal.  Oropharynx erythemas   Eyes: Pupils are equal, round, and reactive to light. Right eye exhibits no discharge. Left eye exhibits no discharge.  Neck: Normal range of motion. Neck supple. No thyromegaly present.  Cardiovascular: Normal rate, regular rhythm, normal heart sounds and intact distal pulses.   No murmur heard. Pulmonary/Chest: Effort normal and breath sounds normal. No respiratory distress. He has no wheezes.  Abdominal: Soft. Bowel sounds are normal. He exhibits no distension. There is no tenderness.  Musculoskeletal: Normal  range of motion. He exhibits no edema or tenderness.  Neurological: He is alert and oriented to person, place, and time. He has normal reflexes. No cranial nerve deficit.  Skin: Skin is warm and dry. No rash noted. No erythema.  Psychiatric: He has a normal mood and affect. His behavior is normal. Judgment and thought content normal.  Vitals reviewed.     BP 107/68 mmHg  Pulse 76  Temp(Src) 98.3 F (36.8 C) (Oral)     Assessment & Plan:  1. Acute upper respiratory infection -- Take meds as prescribed - Use a cool mist humidifier  -Use saline nose sprays frequently -Saline irrigations of the nose can be very helpful if done frequently.  * 4X daily for 1 week*  * Use of a nettie pot can be helpful with this. Follow directions with this* -Force fluids -For any cough or congestion  Use plain Mucinex- regular strength or max strength is fine   * Children- consult with Pharmacist for dosing -For fever or aces or pains- take tylenol or ibuprofen appropriate for age and weight.  * for fevers greater than 101 orally you may alternate ibuprofen and tylenol every  3 hours. -Throat lozenges if help - benzonatate (TESSALON) 200 MG capsule; Take 1 capsule (200 mg total) by mouth 3 (three) times daily as needed.  Dispense: 30 capsule; Refill: 1 - HYDROcodone-homatropine (HYCODAN) 5-1.5 MG/5ML syrup; Take 5 mLs by mouth every 8 (eight) hours as needed for cough.  Dispense: 120 mL; Refill: 0 - methylPREDNISolone acetate (DEPO-MEDROL) injection 40 mg; Inject 1 mL (40 mg total) into the muscle once.  Evelina Dun, FNP

## 2015-05-04 NOTE — Patient Instructions (Signed)
Upper Respiratory Infection, Adult An upper respiratory infection (URI) is also sometimes known as the common cold. The upper respiratory tract includes the nose, sinuses, throat, trachea, and bronchi. Bronchi are the airways leading to the lungs. Most people improve within 1 week, but symptoms can last up to 2 weeks. A residual cough may last even longer.  CAUSES Many different viruses can infect the tissues lining the upper respiratory tract. The tissues become irritated and inflamed and often become very moist. Mucus production is also common. A cold is contagious. You can easily spread the virus to others by oral contact. This includes kissing, sharing a glass, coughing, or sneezing. Touching your mouth or nose and then touching a surface, which is then touched by another person, can also spread the virus. SYMPTOMS  Symptoms typically develop 1 to 3 days after you come in contact with a cold virus. Symptoms vary from person to person. They may include:  Runny nose.  Sneezing.  Nasal congestion.  Sinus irritation.  Sore throat.  Loss of voice (laryngitis).  Cough.  Fatigue.  Muscle aches.  Loss of appetite.  Headache.  Low-grade fever. DIAGNOSIS  You might diagnose your own cold based on familiar symptoms, since most people get a cold 2 to 3 times a year. Your caregiver can confirm this based on your exam. Most importantly, your caregiver can check that your symptoms are not due to another disease such as strep throat, sinusitis, pneumonia, asthma, or epiglottitis. Blood tests, throat tests, and X-rays are not necessary to diagnose a common cold, but they may sometimes be helpful in excluding other more serious diseases. Your caregiver will decide if any further tests are required. RISKS AND COMPLICATIONS  You may be at risk for a more severe case of the common cold if you smoke cigarettes, have chronic heart disease (such as heart failure) or lung disease (such as asthma), or if  you have a weakened immune system. The very young and very old are also at risk for more serious infections. Bacterial sinusitis, middle ear infections, and bacterial pneumonia can complicate the common cold. The common cold can worsen asthma and chronic obstructive pulmonary disease (COPD). Sometimes, these complications can require emergency medical care and may be life-threatening. PREVENTION  The best way to protect against getting a cold is to practice good hygiene. Avoid oral or hand contact with people with cold symptoms. Wash your hands often if contact occurs. There is no clear evidence that vitamin C, vitamin E, echinacea, or exercise reduces the chance of developing a cold. However, it is always recommended to get plenty of rest and practice good nutrition. TREATMENT  Treatment is directed at relieving symptoms. There is no cure. Antibiotics are not effective, because the infection is caused by a virus, not by bacteria. Treatment may include:  Increased fluid intake. Sports drinks offer valuable electrolytes, sugars, and fluids.  Breathing heated mist or steam (vaporizer or shower).  Eating chicken soup or other clear broths, and maintaining good nutrition.  Getting plenty of rest.  Using gargles or lozenges for comfort.  Controlling fevers with ibuprofen or acetaminophen as directed by your caregiver.  Increasing usage of your inhaler if you have asthma. Zinc gel and zinc lozenges, taken in the first 24 hours of the common cold, can shorten the duration and lessen the severity of symptoms. Pain medicines may help with fever, muscle aches, and throat pain. A variety of non-prescription medicines are available to treat congestion and runny nose. Your caregiver   can make recommendations and may suggest nasal or lung inhalers for other symptoms.  HOME CARE INSTRUCTIONS   Only take over-the-counter or prescription medicines for pain, discomfort, or fever as directed by your  caregiver.  Use a warm mist humidifier or inhale steam from a shower to increase air moisture. This may keep secretions moist and make it easier to breathe.  Drink enough water and fluids to keep your urine clear or pale yellow.  Rest as needed.  Return to work when your temperature has returned to normal or as your caregiver advises. You may need to stay home longer to avoid infecting others. You can also use a face mask and careful hand washing to prevent spread of the virus. SEEK MEDICAL CARE IF:   After the first few days, you feel you are getting worse rather than better.  You need your caregiver's advice about medicines to control symptoms.  You develop chills, worsening shortness of breath, or brown or red sputum. These may be signs of pneumonia.  You develop yellow or brown nasal discharge or pain in the face, especially when you bend forward. These may be signs of sinusitis.  You develop a fever, swollen neck glands, pain with swallowing, or white areas in the back of your throat. These may be signs of strep throat. SEEK IMMEDIATE MEDICAL CARE IF:   You have a fever.  You develop severe or persistent headache, ear pain, sinus pain, or chest pain.  You develop wheezing, a prolonged cough, cough up blood, or have a change in your usual mucus (if you have chronic lung disease).  You develop sore muscles or a stiff neck. Document Released: 04/11/2001 Document Revised: 01/08/2012 Document Reviewed: 01/21/2014 [employer] Patient Information 2015 [employer]. This information is not intended to replace advice given to you by your health care provider. Make sure you discuss any questions you have with your health care provider.  - Take meds as prescribed - Use a cool mist humidifier  -Use saline nose sprays frequently -Saline irrigations of the nose can be very helpful if done frequently.  * 4X daily for 1 week*  * Use of a nettie pot can be helpful with this. Follow  directions with this* -Force fluids -For any cough or congestion  Use plain Mucinex- regular strength or max strength is fine   * Children- consult with Pharmacist for dosing -For fever or aces or pains- take tylenol or ibuprofen appropriate for age and weight.  * for fevers greater than 101 orally you may alternate ibuprofen and tylenol every  3 hours. -Throat lozenges if help   Christy Hawks, FNP   

## 2015-05-04 NOTE — Addendum Note (Signed)
Addended by: Shelbie Ammons on: 05/04/2015 11:34 AM   Modules accepted: Orders

## 2015-05-06 ENCOUNTER — Other Ambulatory Visit (HOSPITAL_COMMUNITY): Payer: Self-pay | Admitting: Orthopaedic Surgery

## 2015-05-07 ENCOUNTER — Encounter (HOSPITAL_COMMUNITY): Payer: Self-pay | Admitting: *Deleted

## 2015-05-07 NOTE — Progress Notes (Signed)
[name] has had a cough for > 1 week, he was seen by  Evelina Dun, FNP (PCP).  Patient received Methylprednisolone Acetate 40 mg IM on Tuesday, July 5 injection and prescriptions for cough medications.  Patient reported that cough is a little better.   Patient was seen at [facility] office and was started on an antibiotic.  I called Tressie Stalker at Dr Lorin Mercy office to see if patient is to stop Plavix and Pletal (Patient reported that he was not informed. Malachy Mood spoke with Dr Lorin Mercy and said patient can continue to take Plavix and Pletal.

## 2015-05-09 NOTE — H&P (Signed)
[name] is an 64 y.o. male.   Patient returns post 04/28/2015 amputation with right 5th ray amputation.  He had poor skin and blood supply.  He already had multiple surgeries on the opposite leg and ended up following the above knee amputation before it finally healed.  He stopped his pain medication.  He is afebrile.  His wound is dehisced distally and proximally.  Foul smelling drainage.  It is soupy.  I discussed with him wounds falling apart 1 week postop with inadequate blood supply.  He has calcification of the vessels and was seen by vascular preoperatively and felt he was not a candidate for bypass surgery.  Potential for nonhealing was discussed in detail.  Patient has plenty of Augmentin at home 875/125.  He can restart this b.i.d.  We will get him scheduled for surgery on Monday.   PAST MEDICAL HISTORY:  Diabetes, peripheral arterial disease.   MEDICATIONS:  Atenolol 25 mg daily, calcium with vitamin D3, cilostazol 100 mg daily, Plavix 75 mg daily, Lasix 40 mg b.i.d., atorvastatin 20 mg daily, Lantus 34 units at bedtime, Novolog sliding scale 10-14 units as needed, Byetta 10 mcg b.i.d.   PAST SURGICAL HISTORY:  Cataract surgery 2013 right and left.  Laser surgery both eyes.  Amputation 2004 with distal foot amputation toes and then progressing to BK and then AKA.   SOCIAL HISTORY:  Unchanged.  He is here with his wife, [name].   FAMILY HISTORY:  Positive for diabetes, heart disease, prostate and lung cancer and hypertension.   REVIEW OF SYSTEMS:  A 14-point review of systems positive for arthritis, clot, heart disease, cataracts, diabetes, hypertension, kidney disease, pneumonia, sleep apnea, sickle cell trait.    Past Medical History  Diagnosis Date  . Hypertension   . Hyperlipidemia   . Necrosis     #2 nail   . Myocardial infarction     "mild" heart attack  . Poor circulation of extremity   . Sleep apnea     uses cpap, getting a new one  . Type 2 Diabetes mellitus    Type 2  . Arthritis   . Pneumonia     as a child  . CKD (chronic kidney disease) 06/28/2014    Sees Dr Florene Glen    Past Surgical History  Procedure Laterality Date  . Above knee leg amputation Left 2004  . Cataract extraction w/phaco  09/05/2012    Procedure: CATARACT EXTRACTION PHACO AND INTRAOCULAR LENS PLACEMENT (IOC);  Surgeon: Tonny Branch, MD;  Location: [facility];  Service: Ophthalmology;  Laterality: Left;  CDE=5.45  . Cataract extraction w/phaco  10/03/2012    Procedure: CATARACT EXTRACTION PHACO AND INTRAOCULAR LENS PLACEMENT (IOC);  Surgeon: Tonny Branch, MD;  Location: [facility];  Service: Ophthalmology;  Laterality: Right;  CDE: 12.31  . Colonoscopy    . Amputation Right 04/28/2015    Procedure: AMPUTATION RAY, RIGHT 5TH TOE;  Surgeon: Marybelle Killings, MD;  Location: [facility];  Service: Orthopedics;  Laterality: Right;    Family History  Problem Relation Age of Onset  . Diabetes Mother   . Hypertension Mother   . Cancer Father   . Cancer Sister   . Sickle cell anemia Daughter    Social History:  reports that he has never smoked. He has never used smokeless tobacco. He reports that he does not drink alcohol or use illicit drugs.  Allergies:  Allergies  Allergen Reactions  . Zolpidem Tartrate Other (See Comments)    disorientation  No prescriptions prior to admission    No results found for this or any previous visit (from the past 48 hour(s)). No results found.  Review of Systems  Constitutional: Negative.   HENT: Negative.   Eyes: Negative.   Respiratory: Negative.   Cardiovascular: Negative.   Gastrointestinal: Negative.   Genitourinary: Negative.   Psychiatric/Behavioral: Negative.     There were no vitals taken for this visit. Physical Exam  Constitutional: He is oriented to person, place, and time. No distress.  HENT:  Head: Normocephalic.  Eyes: EOM are normal. Pupils are equal, round, and reactive to light.  Neck: Normal range of motion.  Respiratory: No  respiratory distress.  GI: He exhibits no distension.  Neurological: He is alert and oriented to person, place, and time.  Psychiatric: He has a normal mood and affect.    Patient is alert and oriented.  Extraocular movements intact.  He does have some decreased facial acuity related to his diabetes.  Heart is regular.  No abdominal tenderness.  Distal right foot has the dehiscence.  Sutures are pulling loose.  Foul smelling drainage present. No cellulitis. No lymphadenopathy.   PLAN:  Below the knee amputation on the right.  He understands this also might not heal and he may need a more proximal amputation; however, with the left above the knee amputation and his inability to ambulate would be so much better with the right BKA versus 2 AKAs that it is unquestionable his best choice at this point.  Revision to BKA.  Risks of surgery discussed.  All questions answered.  Patient's daughter as well as wife was present and we went over the outline plan and they all agree.  [name] M 05/09/2015, 10:21 PM

## 2015-05-10 ENCOUNTER — Inpatient Hospital Stay (HOSPITAL_COMMUNITY): Payer: BLUE CROSS/BLUE SHIELD | Admitting: Emergency Medicine

## 2015-05-10 ENCOUNTER — Inpatient Hospital Stay (HOSPITAL_COMMUNITY)
Admission: RE | Admit: 2015-05-10 | Discharge: 2015-05-14 | DRG: 475 | Disposition: A | Discharge status: Rehabilitation Facility | Payer: BLUE CROSS/BLUE SHIELD | Source: Ambulatory Visit | Attending: Orthopaedic Surgery | Admitting: Orthopaedic Surgery

## 2015-05-10 ENCOUNTER — Encounter (HOSPITAL_COMMUNITY)
Admission: RE | Disposition: A | Discharge status: Rehabilitation Facility | Payer: Self-pay | Source: Ambulatory Visit | Attending: Orthopaedic Surgery

## 2015-05-10 ENCOUNTER — Encounter (HOSPITAL_COMMUNITY): Payer: Self-pay | Admitting: *Deleted

## 2015-05-10 DIAGNOSIS — N189 Chronic kidney disease, unspecified: Secondary | ICD-10-CM | POA: Diagnosis present

## 2015-05-10 DIAGNOSIS — T8781 Dehiscence of amputation stump: Principal | ICD-10-CM | POA: Diagnosis present

## 2015-05-10 DIAGNOSIS — E1151 Type 2 diabetes mellitus with diabetic peripheral angiopathy without gangrene: Secondary | ICD-10-CM | POA: Diagnosis present

## 2015-05-10 DIAGNOSIS — E1121 Type 2 diabetes mellitus with diabetic nephropathy: Secondary | ICD-10-CM | POA: Diagnosis present

## 2015-05-10 DIAGNOSIS — Z8249 Family history of ischemic heart disease and other diseases of the circulatory system: Secondary | ICD-10-CM

## 2015-05-10 DIAGNOSIS — Z89612 Acquired absence of left leg above knee: Secondary | ICD-10-CM

## 2015-05-10 DIAGNOSIS — Z79899 Other long term (current) drug therapy: Secondary | ICD-10-CM

## 2015-05-10 DIAGNOSIS — Z89519 Acquired absence of unspecified leg below knee: Secondary | ICD-10-CM

## 2015-05-10 DIAGNOSIS — Z794 Long term (current) use of insulin: Secondary | ICD-10-CM

## 2015-05-10 DIAGNOSIS — Z833 Family history of diabetes mellitus: Secondary | ICD-10-CM

## 2015-05-10 DIAGNOSIS — M869 Osteomyelitis, unspecified: Secondary | ICD-10-CM | POA: Diagnosis present

## 2015-05-10 DIAGNOSIS — I252 Old myocardial infarction: Secondary | ICD-10-CM

## 2015-05-10 DIAGNOSIS — Z7902 Long term (current) use of antithrombotics/antiplatelets: Secondary | ICD-10-CM

## 2015-05-10 DIAGNOSIS — G4733 Obstructive sleep apnea (adult) (pediatric): Secondary | ICD-10-CM | POA: Diagnosis present

## 2015-05-10 DIAGNOSIS — I129 Hypertensive chronic kidney disease with stage 1 through stage 4 chronic kidney disease, or unspecified chronic kidney disease: Secondary | ICD-10-CM | POA: Diagnosis present

## 2015-05-10 DIAGNOSIS — D62 Acute posthemorrhagic anemia: Secondary | ICD-10-CM | POA: Diagnosis not present

## 2015-05-10 DIAGNOSIS — E785 Hyperlipidemia, unspecified: Secondary | ICD-10-CM | POA: Diagnosis present

## 2015-05-10 HISTORY — PX: AMPUTATION: SHX166

## 2015-05-10 LAB — GLUCOSE, CAPILLARY
GLUCOSE-CAPILLARY: 107 mg/dL — AB (ref 65–99)
GLUCOSE-CAPILLARY: 109 mg/dL — AB (ref 65–99)
GLUCOSE-CAPILLARY: 153 mg/dL — AB (ref 65–99)
Glucose-Capillary: 136 mg/dL — ABNORMAL HIGH (ref 65–99)

## 2015-05-10 LAB — COMPREHENSIVE METABOLIC PANEL
ALBUMIN: 2.4 g/dL — AB (ref 3.5–5.0)
ALT: 29 U/L (ref 17–63)
ANION GAP: 9 (ref 5–15)
AST: 26 U/L (ref 15–41)
Alkaline Phosphatase: 70 U/L (ref 38–126)
BILIRUBIN TOTAL: 0.5 mg/dL (ref 0.3–1.2)
BUN: 26 mg/dL — ABNORMAL HIGH (ref 6–20)
CHLORIDE: 107 mmol/L (ref 101–111)
CO2: 23 mmol/L (ref 22–32)
CREATININE: 1.68 mg/dL — AB (ref 0.61–1.24)
Calcium: 8.9 mg/dL (ref 8.9–10.3)
GFR calc Af Amer: 49 mL/min — ABNORMAL LOW (ref >60)
GFR calc non Af Amer: 42 mL/min — ABNORMAL LOW (ref >60)
Glucose, Bld: 151 mg/dL — ABNORMAL HIGH (ref 65–99)
Potassium: 4.4 mmol/L (ref 3.5–5.1)
Sodium: 139 mmol/L (ref 135–145)
TOTAL PROTEIN: 8.2 g/dL — AB (ref 6.5–8.1)

## 2015-05-10 LAB — PROTIME-INR
INR: 1.37 (ref 0.00–1.49)
Prothrombin Time: 16.9 seconds — ABNORMAL HIGH (ref 11.6–15.2)

## 2015-05-10 LAB — CBC
HEMATOCRIT: 34.1 % — AB (ref 39.0–52.0)
Hemoglobin: 11.7 g/dL — ABNORMAL LOW (ref 13.0–17.0)
MCH: 29.5 pg (ref 26.0–34.0)
MCHC: 34.3 g/dL (ref 30.0–36.0)
MCV: 85.9 fL (ref 78.0–100.0)
PLATELETS: 291 10*3/uL (ref 150–400)
RBC: 3.97 MIL/uL — AB (ref 4.22–5.81)
RDW: 13.5 % (ref 11.5–15.5)
WBC: 10.9 10*3/uL — ABNORMAL HIGH (ref 4.0–10.5)

## 2015-05-10 SURGERY — AMPUTATION BELOW KNEE
Anesthesia: Regional | Laterality: Right

## 2015-05-10 MED ORDER — EXENATIDE 10 MCG/0.04ML ~~LOC~~ SOPN: 10.0000 ug | PEN_INJECTOR | Freq: Two times a day (BID) | SUBCUTANEOUS | Status: DC

## 2015-05-10 MED ORDER — OXYCODONE HCL 5 MG PO TABS
5.0000 mg | ORAL_TABLET | ORAL | Status: DC | PRN
  Administered 2015-05-10 – 2015-05-11 (×3): 10 mg via ORAL
  Filled 2015-05-10 (×2): qty 2

## 2015-05-10 MED ORDER — INSULIN ASPART 100 UNIT/ML FLEXPEN: 8.0000 [IU] | PEN_INJECTOR | Freq: Three times a day (TID) | SUBCUTANEOUS | Status: DC

## 2015-05-10 MED ORDER — METHOCARBAMOL 500 MG PO TABS
500.0000 mg | ORAL_TABLET | Freq: Four times a day (QID) | ORAL | Status: DC | PRN
  Administered 2015-05-10: 500 mg via ORAL

## 2015-05-10 MED ORDER — INSULIN GLARGINE 100 UNIT/ML ~~LOC~~ SOLN
34.0000 [IU] | Freq: Every day | SUBCUTANEOUS | Status: DC
  Administered 2015-05-10 – 2015-05-13 (×4): 34 [IU] via SUBCUTANEOUS
  Filled 2015-05-10 (×5): qty 0.34

## 2015-05-10 MED ORDER — ONDANSETRON HCL 4 MG PO TABS: 4.0000 mg | ORAL_TABLET | Freq: Four times a day (QID) | ORAL | Status: DC | PRN

## 2015-05-10 MED ORDER — INSULIN ASPART 100 UNIT/ML ~~LOC~~ SOLN
8.0000 [IU] | Freq: Three times a day (TID) | SUBCUTANEOUS | Status: DC
  Administered 2015-05-11: 8 [IU] via SUBCUTANEOUS
  Administered 2015-05-11: 9 [IU] via SUBCUTANEOUS
  Administered 2015-05-12: 8 [IU] via SUBCUTANEOUS
  Administered 2015-05-12: 9 [IU] via SUBCUTANEOUS
  Administered 2015-05-12: 8 [IU] via SUBCUTANEOUS
  Administered 2015-05-13: 12 [IU] via SUBCUTANEOUS

## 2015-05-10 MED ORDER — HYDROMORPHONE HCL 1 MG/ML IJ SOLN
0.2500 mg | INTRAMUSCULAR | Status: DC | PRN
  Administered 2015-05-10 (×5): 0.5 mg via INTRAVENOUS

## 2015-05-10 MED ORDER — METHOCARBAMOL 500 MG PO TABS
ORAL_TABLET | ORAL | Status: AC
  Filled 2015-05-10: qty 1

## 2015-05-10 MED ORDER — ATENOLOL 50 MG PO TABS
25.0000 mg | ORAL_TABLET | Freq: Every day | ORAL | Status: DC
  Administered 2015-05-11 – 2015-05-14 (×4): 25 mg via ORAL
  Filled 2015-05-10 (×4): qty 1

## 2015-05-10 MED ORDER — CILOSTAZOL 100 MG PO TABS
100.0000 mg | ORAL_TABLET | Freq: Two times a day (BID) | ORAL | Status: DC
  Administered 2015-05-10 – 2015-05-14 (×8): 100 mg via ORAL
  Filled 2015-05-10 (×10): qty 1

## 2015-05-10 MED ORDER — FUROSEMIDE 40 MG PO TABS
40.0000 mg | ORAL_TABLET | Freq: Every day | ORAL | Status: DC
  Administered 2015-05-10 – 2015-05-14 (×5): 40 mg via ORAL
  Filled 2015-05-10 (×5): qty 1

## 2015-05-10 MED ORDER — ACETAMINOPHEN 325 MG PO TABS: 650.0000 mg | ORAL_TABLET | Freq: Four times a day (QID) | ORAL | Status: DC | PRN

## 2015-05-10 MED ORDER — FENTANYL CITRATE (PF) 250 MCG/5ML IJ SOLN
INTRAMUSCULAR | Status: AC
  Filled 2015-05-10: qty 5

## 2015-05-10 MED ORDER — FENTANYL CITRATE (PF) 100 MCG/2ML IJ SOLN
INTRAMUSCULAR | Status: DC | PRN
  Administered 2015-05-10 (×5): 50 ug via INTRAVENOUS

## 2015-05-10 MED ORDER — ACETAMINOPHEN 650 MG RE SUPP
650.0000 mg | Freq: Four times a day (QID) | RECTAL | Status: DC | PRN
Start: 2015-05-10 — End: 2015-05-11

## 2015-05-10 MED ORDER — ONDANSETRON HCL 4 MG/2ML IJ SOLN: 4.0000 mg | Freq: Four times a day (QID) | INTRAMUSCULAR | Status: DC | PRN

## 2015-05-10 MED ORDER — LACTATED RINGERS IV SOLN
INTRAVENOUS | Status: DC
  Administered 2015-05-10 (×2): via INTRAVENOUS

## 2015-05-10 MED ORDER — HYDROMORPHONE HCL 1 MG/ML IJ SOLN
INTRAMUSCULAR | Status: AC
  Filled 2015-05-10: qty 1

## 2015-05-10 MED ORDER — OXYCODONE HCL 5 MG PO TABS
ORAL_TABLET | ORAL | Status: AC
  Administered 2015-05-10: 10 mg via ORAL
  Filled 2015-05-10: qty 2

## 2015-05-10 MED ORDER — LIDOCAINE HCL (CARDIAC) 20 MG/ML IV SOLN
INTRAVENOUS | Status: DC | PRN
  Administered 2015-05-10: 60 mg via INTRAVENOUS

## 2015-05-10 MED ORDER — 0.9 % SODIUM CHLORIDE (POUR BTL) OPTIME
TOPICAL | Status: DC | PRN
  Administered 2015-05-10: 1000 mL

## 2015-05-10 MED ORDER — CLOPIDOGREL BISULFATE 75 MG PO TABS
75.0000 mg | ORAL_TABLET | Freq: Every day | ORAL | Status: DC
  Administered 2015-05-10 – 2015-05-14 (×5): 75 mg via ORAL
  Filled 2015-05-10 (×6): qty 1

## 2015-05-10 MED ORDER — SODIUM CHLORIDE 0.9 % IV SOLN
INTRAVENOUS | Status: DC
  Administered 2015-05-10: 14:00:00 via INTRAVENOUS

## 2015-05-10 MED ORDER — CEFAZOLIN SODIUM 1-5 GM-% IV SOLN
1.0000 g | Freq: Three times a day (TID) | INTRAVENOUS | Status: DC
  Filled 2015-05-10 (×2): qty 50

## 2015-05-10 MED ORDER — HYDROMORPHONE HCL 1 MG/ML IJ SOLN
1.0000 mg | INTRAMUSCULAR | Status: DC | PRN
  Administered 2015-05-10 – 2015-05-12 (×7): 1 mg via INTRAVENOUS
  Filled 2015-05-10 (×7): qty 1

## 2015-05-10 MED ORDER — BISACODYL 10 MG RE SUPP: 10.0000 mg | Freq: Every day | RECTAL | Status: DC | PRN

## 2015-05-10 MED ORDER — METOCLOPRAMIDE HCL 5 MG PO TABS: 5.0000 mg | ORAL_TABLET | Freq: Three times a day (TID) | ORAL | Status: DC | PRN

## 2015-05-10 MED ORDER — PROPOFOL 10 MG/ML IV BOLUS
INTRAVENOUS | Status: DC | PRN
  Administered 2015-05-10: 150 mg via INTRAVENOUS

## 2015-05-10 MED ORDER — PROMETHAZINE HCL 25 MG/ML IJ SOLN: 6.2500 mg | INTRAMUSCULAR | Status: DC | PRN

## 2015-05-10 MED ORDER — FLEET ENEMA 7-19 GM/118ML RE ENEM: 1.0000 | ENEMA | Freq: Once | RECTAL | Status: AC | PRN

## 2015-05-10 MED ORDER — FENTANYL CITRATE (PF) 100 MCG/2ML IJ SOLN
INTRAMUSCULAR | Status: AC
  Administered 2015-05-10: 50 ug
  Filled 2015-05-10: qty 2

## 2015-05-10 MED ORDER — HYDROMORPHONE HCL 1 MG/ML IJ SOLN: 0.2500 mg | INTRAMUSCULAR | Status: DC | PRN

## 2015-05-10 MED ORDER — METOCLOPRAMIDE HCL 5 MG/ML IJ SOLN: 5.0000 mg | Freq: Three times a day (TID) | INTRAMUSCULAR | Status: DC | PRN

## 2015-05-10 MED ORDER — CHLORHEXIDINE GLUCONATE 4 % EX LIQD: 60.0000 mL | Freq: Once | CUTANEOUS | Status: DC

## 2015-05-10 MED ORDER — MEPERIDINE HCL 25 MG/ML IJ SOLN: 6.2500 mg | INTRAMUSCULAR | Status: DC | PRN

## 2015-05-10 MED ORDER — MIDAZOLAM HCL 2 MG/2ML IJ SOLN
INTRAMUSCULAR | Status: AC
Start: 2015-05-10 — End: 2015-05-10
  Administered 2015-05-10: 2 mg
  Filled 2015-05-10: qty 2

## 2015-05-10 MED ORDER — CEFAZOLIN SODIUM-DEXTROSE 2-3 GM-% IV SOLR
2.0000 g | INTRAVENOUS | Status: AC
  Administered 2015-05-10: 2 g via INTRAVENOUS
  Filled 2015-05-10: qty 50

## 2015-05-10 MED ORDER — INSULIN GLARGINE 100 UNIT/ML SOLOSTAR PEN: 34.0000 [IU] | PEN_INJECTOR | Freq: Every day | SUBCUTANEOUS | Status: DC

## 2015-05-10 MED ORDER — ONDANSETRON HCL 4 MG/2ML IJ SOLN
INTRAMUSCULAR | Status: DC | PRN
  Administered 2015-05-10: 4 mg via INTRAVENOUS

## 2015-05-10 MED ORDER — CEFAZOLIN SODIUM 1-5 GM-% IV SOLN
1.0000 g | Freq: Three times a day (TID) | INTRAVENOUS | Status: AC
  Administered 2015-05-10 – 2015-05-11 (×2): 1 g via INTRAVENOUS
  Filled 2015-05-10 (×2): qty 50

## 2015-05-10 MED ORDER — MIDAZOLAM HCL 2 MG/2ML IJ SOLN
INTRAMUSCULAR | Status: AC
  Filled 2015-05-10: qty 2

## 2015-05-10 MED ORDER — DOCUSATE SODIUM 100 MG PO CAPS
100.0000 mg | ORAL_CAPSULE | Freq: Two times a day (BID) | ORAL | Status: DC
  Administered 2015-05-10 – 2015-05-14 (×8): 100 mg via ORAL
  Filled 2015-05-10 (×8): qty 1

## 2015-05-10 MED ORDER — POTASSIUM CHLORIDE IN NACL 20-0.45 MEQ/L-% IV SOLN
INTRAVENOUS | Status: DC
  Administered 2015-05-10 – 2015-05-11 (×2): via INTRAVENOUS
  Filled 2015-05-10 (×10): qty 1000

## 2015-05-10 MED ORDER — METHOCARBAMOL 1000 MG/10ML IJ SOLN
500.0000 mg | Freq: Four times a day (QID) | INTRAVENOUS | Status: DC | PRN
  Filled 2015-05-10: qty 5

## 2015-05-10 MED ORDER — ROSUVASTATIN CALCIUM 10 MG PO TABS
20.0000 mg | ORAL_TABLET | Freq: Every day | ORAL | Status: DC
  Administered 2015-05-10 – 2015-05-14 (×5): 20 mg via ORAL
  Filled 2015-05-10 (×5): qty 2

## 2015-05-10 SURGICAL SUPPLY — 54 items
BANDAGE ELASTIC 4 VELCRO ST LF (GAUZE/BANDAGES/DRESSINGS) ×2 IMPLANT
BANDAGE ELASTIC 6 VELCRO ST LF (GAUZE/BANDAGES/DRESSINGS) ×3 IMPLANT
BANDAGE ESMARK 6X9 LF (GAUZE/BANDAGES/DRESSINGS) IMPLANT
BLADE SAW SAG 29X58X.64 (BLADE) ×2 IMPLANT
BNDG CMPR 9X6 STRL LF SNTH (GAUZE/BANDAGES/DRESSINGS)
BNDG COHESIVE 6X5 TAN STRL LF (GAUZE/BANDAGES/DRESSINGS) ×2 IMPLANT
BNDG ESMARK 6X9 LF (GAUZE/BANDAGES/DRESSINGS)
BNDG GAUZE ELAST 4 BULKY (GAUZE/BANDAGES/DRESSINGS) ×4 IMPLANT
CLEANER TIP ELECTROSURG 2X2 (MISCELLANEOUS) ×2 IMPLANT
COVER SURGICAL LIGHT HANDLE (MISCELLANEOUS) ×2 IMPLANT
CUFF TOURNIQUET SINGLE 34IN LL (TOURNIQUET CUFF) ×1 IMPLANT
CUFF TOURNIQUET SINGLE 44IN (TOURNIQUET CUFF) IMPLANT
DRAIN PENROSE 1/2X12 LTX STRL (WOUND CARE) IMPLANT
DRAPE EXTREMITY T 121X128X90 (DRAPE) ×2 IMPLANT
DRAPE IMP U-DRAPE 54X76 (DRAPES) ×1 IMPLANT
DRAPE INCISE IOBAN 66X45 STRL (DRAPES) ×2 IMPLANT
DRAPE PROXIMA HALF (DRAPES) ×4 IMPLANT
DRAPE U-SHAPE 47X51 STRL (DRAPES) ×2 IMPLANT
DRSG ADAPTIC 3X8 NADH LF (GAUZE/BANDAGES/DRESSINGS) ×1 IMPLANT
DRSG PAD ABDOMINAL 8X10 ST (GAUZE/BANDAGES/DRESSINGS) ×4 IMPLANT
DURAPREP 26ML APPLICATOR (WOUND CARE) ×2 IMPLANT
EVACUATOR 1/8 PVC DRAIN (DRAIN) IMPLANT
GAUZE SPONGE 4X4 12PLY STRL (GAUZE/BANDAGES/DRESSINGS) ×3 IMPLANT
GAUZE XEROFORM 5X9 LF (GAUZE/BANDAGES/DRESSINGS) ×2 IMPLANT
GLOVE BIOGEL PI IND STRL 8 (GLOVE) ×2 IMPLANT
GLOVE BIOGEL PI INDICATOR 8 (GLOVE) ×2
GLOVE ORTHO TXT STRL SZ7.5 (GLOVE) ×4 IMPLANT
GOWN STRL REUS W/ TWL LRG LVL3 (GOWN DISPOSABLE) ×1 IMPLANT
GOWN STRL REUS W/ TWL XL LVL3 (GOWN DISPOSABLE) ×1 IMPLANT
GOWN STRL REUS W/TWL 2XL LVL3 (GOWN DISPOSABLE) ×2 IMPLANT
GOWN STRL REUS W/TWL LRG LVL3 (GOWN DISPOSABLE) ×2
GOWN STRL REUS W/TWL XL LVL3 (GOWN DISPOSABLE) ×2
IMMOBILIZER KNEE 22 UNIV (SOFTGOODS) ×1 IMPLANT
KIT BASIN OR (CUSTOM PROCEDURE TRAY) ×2 IMPLANT
KIT ROOM TURNOVER OR (KITS) ×2 IMPLANT
MANIFOLD NEPTUNE II (INSTRUMENTS) ×2 IMPLANT
NDL MAYO TROCAR (NEEDLE) ×1 IMPLANT
NEEDLE MAYO TROCAR (NEEDLE) ×2 IMPLANT
NS IRRIG 1000ML POUR BTL (IV SOLUTION) ×2 IMPLANT
PACK GENERAL/GYN (CUSTOM PROCEDURE TRAY) ×2 IMPLANT
PAD ARMBOARD 7.5X6 YLW CONV (MISCELLANEOUS) ×4 IMPLANT
SPONGE LAP 18X18 X RAY DECT (DISPOSABLE) ×1 IMPLANT
STAPLER VISISTAT 35W (STAPLE) ×2 IMPLANT
STOCKINETTE IMPERVIOUS LG (DRAPES) ×1 IMPLANT
SUT ETHILON 2 0 PSLX (SUTURE) ×4 IMPLANT
SUT SILK 3 0 SH CR/8 (SUTURE) ×2 IMPLANT
SUT VIC AB 0 CT1 27 (SUTURE) ×2
SUT VIC AB 0 CT1 27XBRD ANBCTR (SUTURE) ×1 IMPLANT
SUT VIC AB 1 CTX 18 (SUTURE) ×1 IMPLANT
SUT VICRYL 0 TIES 12 18 (SUTURE) ×2 IMPLANT
SUT VICRYL AB 2 0 TIES (SUTURE) ×2 IMPLANT
TOWEL OR 17X24 6PK STRL BLUE (TOWEL DISPOSABLE) ×2 IMPLANT
TOWEL OR 17X26 10 PK STRL BLUE (TOWEL DISPOSABLE) ×2 IMPLANT
WATER STERILE IRR 1000ML POUR (IV SOLUTION) ×2 IMPLANT

## 2015-05-10 NOTE — Interval H&P Note (Signed)
History and Physical Interval Note:  05/10/2015 3:22 PM  [name]  has presented today for surgery, with the diagnosis of Dehiscence right 5th ray amputation  The various methods of treatment have been discussed with the patient and family. After consideration of risks, benefits and other options for treatment, the patient has consented to  Procedure(s): Right Below Knee Amputation (Right) as a surgical intervention .  The patient's history has been reviewed, patient examined, no change in status, stable for surgery.  I have reviewed the patient's chart and labs.  Questions were answered to the patient's satisfaction.     Gayatri Teasdale C

## 2015-05-10 NOTE — Anesthesia Procedure Notes (Addendum)
Anesthesia Regional Block:  Popliteal block  Pre-Anesthetic Checklist: ,, timeout performed, Correct Patient, Correct Site, Correct Laterality, Correct Procedure, Correct Position, site marked, Risks and benefits discussed, Surgical consent,  Pre-op evaluation,  Post-op pain management  Laterality: Right  Prep: chloraprep       Needles:  Injection technique: Single-shot  Needle Type: Stimiplex     Needle Length: 10cm 10 cm Needle Gauge: 21 and 21 G    Additional Needles:  Procedures: ultrasound guided (picture in chart)  Motor weakness within 5 minutes. Popliteal block  Nerve Stimulator or Paresthesia:  Response: Plantar flexion/toe flexion, 0.5 mA,   Additional Responses:   Narrative:  Injection made incrementally with aspirations every 5 mL.  Performed by: Personally  Anesthesiologist: Nolon Nations  Additional Notes: Nerve located and needle positioned with direct ultrasound guidance. Good perineural spread. Patient tolerated well.   Procedure Name: LMA Insertion Date/Time: 05/10/2015 4:45 PM Performed by: Willeen Cass P Pre-anesthesia Checklist: Patient identified, Emergency Drugs available, Suction available, Patient being monitored and Timeout performed Patient Re-evaluated:Patient Re-evaluated prior to inductionOxygen Delivery Method: Circle system utilized Preoxygenation: Pre-oxygenation with 100% oxygen Intubation Type: IV induction Ventilation: Mask ventilation without difficulty LMA: LMA inserted LMA Size: 5.0 Number of attempts: 1 Placement Confirmation: positive ETCO2 Tube secured with: Tape

## 2015-05-10 NOTE — Anesthesia Preprocedure Evaluation (Signed)
Anesthesia Evaluation  Patient identified by MRN, date of birth, ID band Patient awake    Reviewed: Allergy & Precautions, NPO status , Patient's Chart, lab work & pertinent test results  History of Anesthesia Complications Negative for: history of anesthetic complications  Airway Mallampati: I  TM Distance: >3 FB Neck ROM: Full    Dental  (+) Edentulous Upper, Missing, Dental Advisory Given   Pulmonary sleep apnea , pneumonia -,  breath sounds clear to auscultation        Cardiovascular hypertension, + Past MI and + Peripheral Vascular Disease Normal cardiovascular examRhythm:Regular     Neuro/Psych TIAnegative psych ROS   GI/Hepatic negative GI ROS, Neg liver ROS,   Endo/Other  diabetes  Renal/GU Renal InsufficiencyRenal disease     Musculoskeletal   Abdominal   Peds  Hematology   Anesthesia Other Findings   Reproductive/Obstetrics                             Anesthesia Physical  Anesthesia Plan  ASA: III  Anesthesia Plan: General and Regional   Post-op Pain Management:    Induction: Intravenous  Airway Management Planned: LMA  Additional Equipment: None  Intra-op Plan:   Post-operative Plan: Extubation in OR  Informed Consent: I have reviewed the patients History and Physical, chart, labs and discussed the procedure including the risks, benefits and alternatives for the proposed anesthesia with the patient or authorized representative who has indicated his/her understanding and acceptance.   Dental advisory given  Plan Discussed with: CRNA  Anesthesia Plan Comments:         Anesthesia Quick Evaluation

## 2015-05-10 NOTE — Brief Op Note (Signed)
05/10/2015  6:37 PM  PATIENT:  [name]  64 y.o. male  PRE-OPERATIVE DIAGNOSIS:  Dehiscence right 5th ray amputation  POST-OPERATIVE DIAGNOSIS:  Dehiscence right 5th ray amputation  PROCEDURE:  Procedure(s): Right Below Knee Amputation (Right)  SURGEON:  Surgeon(s) and Role:    * Marybelle Killings, MD - Primary  PHYSICIAN ASSISTANT:   ASSISTANTS: none   ANESTHESIA:   general  EBL:  Total I/O In: 1000 [I.V.:1000] Out: -   BLOOD ADMINISTERED:none  DRAINS: none   LOCAL MEDICATIONS USED:  NONE  SPECIMEN:  Right BKA   DISPOSITION OF SPECIMEN:  path  COUNTS:  YES  TOURNIQUET:   Total Tourniquet Time Documented: Thigh (Right) - 23 minutes Total: Thigh (Right) - 23 minutes   DICTATION: .Other Dictation: Dictation Number [id number]  PLAN OF CARE: Admit to inpatient   PATIENT DISPOSITION:  PACU - hemodynamically stable.   Delay start of Pharmacological VTE agent (>24hrs) due to surgical blood loss or risk of bleeding: yes

## 2015-05-10 NOTE — Transfer of Care (Signed)
Immediate Anesthesia Transfer of Care Note  Patient: [name]  Procedure(s) Performed: Procedure(s): Right Below Knee Amputation (Right)  Patient Location: PACU  Anesthesia Type:General  Level of Consciousness: awake, alert , oriented and patient cooperative  Airway & Oxygen Therapy: Patient Spontanous Breathing and Patient connected to nasal cannula oxygen  Post-op Assessment: Report given to RN and Post -op Vital signs reviewed and stable  Post vital signs: Reviewed and stable  Last Vitals:  Filed Vitals:   05/10/15 1356  BP: 126/77  Pulse: 71  Temp: 37.1 C  Resp: 20    Complications: No apparent anesthesia complications

## 2015-05-10 NOTE — Anesthesia Postprocedure Evaluation (Signed)
Anesthesia Post Note  Patient: [name]  Procedure(s) Performed: Procedure(s) (LRB): Right Below Knee Amputation (Right)  Anesthesia type: general  Patient location: PACU  Post pain: Pain level controlled  Post assessment: Patient's Cardiovascular Status Stable  Last Vitals:  Filed Vitals:   05/10/15 2006  BP:   Pulse: 66  Temp: 36.6 C  Resp: 15    Post vital signs: Reviewed and stable  Level of consciousness: sedated  Complications: No apparent anesthesia complications

## 2015-05-11 ENCOUNTER — Encounter (HOSPITAL_COMMUNITY): Payer: Self-pay | Admitting: Orthopaedic Surgery

## 2015-05-11 LAB — GLUCOSE, CAPILLARY
Glucose-Capillary: 168 mg/dL — ABNORMAL HIGH (ref 65–99)
Glucose-Capillary: 119 mg/dL — ABNORMAL HIGH (ref 65–99)
Glucose-Capillary: 238 mg/dL — ABNORMAL HIGH (ref 65–99)

## 2015-05-11 LAB — BASIC METABOLIC PANEL
Anion gap: 7 (ref 5–15)
BUN: 23 mg/dL — AB (ref 6–20)
CHLORIDE: 105 mmol/L (ref 101–111)
CO2: 24 mmol/L (ref 22–32)
Calcium: 8 mg/dL — ABNORMAL LOW (ref 8.9–10.3)
Creatinine, Ser: 1.46 mg/dL — ABNORMAL HIGH (ref 0.61–1.24)
GFR, EST AFRICAN AMERICAN: 58 mL/min — AB (ref >60)
GFR, EST NON AFRICAN AMERICAN: 50 mL/min — AB (ref >60)
Glucose, Bld: 119 mg/dL — ABNORMAL HIGH (ref 65–99)
Potassium: 4.8 mmol/L (ref 3.5–5.1)
Sodium: 136 mmol/L (ref 135–145)

## 2015-05-11 MED ORDER — OXYCODONE HCL 5 MG PO TABS
2.5000 mg | ORAL_TABLET | ORAL | Status: DC | PRN
  Administered 2015-05-11 – 2015-05-12 (×2): 5 mg via ORAL
  Filled 2015-05-11 (×2): qty 1

## 2015-05-11 MED ORDER — OXYCODONE-ACETAMINOPHEN 7.5-325 MG PO TABS: 1.0000 | ORAL_TABLET | ORAL | Status: DC | PRN

## 2015-05-11 MED ORDER — METHOCARBAMOL 750 MG PO TABS
750.0000 mg | ORAL_TABLET | Freq: Four times a day (QID) | ORAL | Status: DC | PRN
  Administered 2015-05-11 – 2015-05-14 (×6): 750 mg via ORAL
  Filled 2015-05-11 (×6): qty 1

## 2015-05-11 MED ORDER — METHOCARBAMOL 1000 MG/10ML IJ SOLN: 500.0000 mg | Freq: Four times a day (QID) | INTRAVENOUS | Status: DC | PRN

## 2015-05-11 MED ORDER — OXYCODONE-ACETAMINOPHEN 5-325 MG PO TABS
1.0000 | ORAL_TABLET | ORAL | Status: DC | PRN
  Administered 2015-05-11 – 2015-05-14 (×10): 2 via ORAL
  Filled 2015-05-11 (×10): qty 2

## 2015-05-11 NOTE — Progress Notes (Signed)
Per PT recommendation, patient will discharge to [facility], CM called SW Leveda Anna at [phone] and updates to follow-up with patient.  Patient still awaits OT evaluation.  CM is available for questions, collaborations to meet patient's need.

## 2015-05-11 NOTE — Progress Notes (Signed)
Subjective: 1 Day Post-Op Procedure(s) (LRB): Right Below Knee Amputation (Right) Patient reports pain as 7 on 0-10 scale.    Objective: Vital signs in last 24 hours: Temp:  [97.9 F (36.6 C)-98.8 F (37.1 C)] 98.3 F (36.8 C) (07/12 0517) Pulse Rate:  [62-71] 64 (07/12 0517) Resp:  [10-28] 16 (07/12 0517) BP: (105-137)/(69-78) 114/75 mmHg (07/12 0517) SpO2:  [97 %-100 %] 100 % (07/12 0517) Weight:  [111.273 kg (245 lb 5 oz)] 111.273 kg (245 lb 5 oz) (07/11 1348)  Intake/Output from previous day: 07/11 0701 - 07/12 0700 In: 1650 [I.V.:1650] Out: 650 [Urine:600; Blood:50] Intake/Output this shift:     Recent Labs  05/10/15 1352  HGB 11.7*    Recent Labs  05/10/15 1352  WBC 10.9*  RBC 3.97*  HCT 34.1*  PLT 291    Recent Labs  05/10/15 1352 05/11/15 0427  NA 139 136  K 4.4 4.8  CL 107 105  CO2 23 24  BUN 26* 23*  CREATININE 1.68* 1.46*  GLUCOSE 151* 119*  CALCIUM 8.9 8.0*    Recent Labs  05/10/15 1352  INR 1.37    dressing dry  Assessment/Plan: 1 Day Post-Op Procedure(s) (LRB): Right Below Knee Amputation (Right) Up with therapy  Correne Lalani C 05/11/2015, 7:35 AM

## 2015-05-11 NOTE — Progress Notes (Signed)
Subjective: C/o pain right BKA.   Objective: Vital signs in last 24 hours: Temp:  [97.9 F (36.6 C)-98.8 F (37.1 C)] 98.3 F (36.8 C) (07/12 0517) Pulse Rate:  [62-71] 64 (07/12 0517) Resp:  [10-28] 16 (07/12 0517) BP: (105-137)/(69-78) 114/75 mmHg (07/12 0517) SpO2:  [97 %-100 %] 100 % (07/12 0517) Weight:  [111.273 kg (245 lb 5 oz)] 111.273 kg (245 lb 5 oz) (07/11 1348)  Intake/Output from previous day: 07/11 0701 - 07/12 0700 In: 1650 [I.V.:1650] Out: 650 [Urine:600; Blood:50] Intake/Output this shift:     Recent Labs  05/10/15 1352  HGB 11.7*    Recent Labs  05/10/15 1352  WBC 10.9*  RBC 3.97*  HCT 34.1*  PLT 291    Recent Labs  05/10/15 1352 05/11/15 0427  NA 139 136  K 4.4 4.8  CL 107 105  CO2 23 24  BUN 26* 23*  CREATININE 1.68* 1.46*  GLUCOSE 151* 119*  CALCIUM 8.9 8.0*    Recent Labs  05/10/15 1352  INR 1.37    Exam:  Dressing c/d/i.    Assessment/Plan: Will adjust pain meds. Anticipate d/c home next 1-2 days.    Amarisa Wilinski M 05/11/2015, 7:33 AM

## 2015-05-11 NOTE — Evaluation (Addendum)
Physical Therapy Evaluation Patient Details Name: [name] MRN: [id number] DOB: June 29, 1951 Today's Date: 05/11/2015   History of Present Illness  Pt is a 64 y/o M s/p R BKA w/ previous L AKA. Pt's PMH includes HTN, MI, DM II.  Clinical Impression  Patient is s/p above surgery resulting in functional limitations due to the deficits listed below (see PT Problem List). Completed AP transfer to chair w/ mod assist to trunk posteriorly.  Pt has LLE prosthsesis and will attempt sit>stand transfer next session as appropriate. Pt's assist is from wife who will not be able to provide physical assist pt requires at this time.  Therefore, recommending CIR upon d/c as pt is highly motivated.  Patient will benefit from skilled PT to increase their independence and safety with mobility to allow discharge to the venue listed below.      Follow Up Recommendations CIR   Equipment Recommendations  Other (comment) (sliding board)    Recommendations for Other Services Rehab consult;OT consult     Precautions / Restrictions Precautions Precautions: Fall Precaution Comments: L AKA Required Braces or Orthoses: Other Brace/Splint Other Brace/Splint: L prosthesis for mobility Restrictions Weight Bearing Restrictions: Yes RLE Weight Bearing: Non weight bearing      Mobility  Bed Mobility Overal bed mobility: Needs Assistance Bed Mobility: Supine to Sit     Supine to sit: Mod assist;HOB elevated     General bed mobility comments: Mod assist w/ trunk posteriorly w/ pt leaning posteriorly.  Uses bed rail to pull up to sitting while turning.  Transfers Overall transfer level: Needs assistance Equipment used: None Transfers: Comptroller transfers: Mod assist   General transfer comment: Mod A for ant-post transfer assist w/ trunk posteriorly.  BLEs supported ant by bed.  Ambulation/Gait                Stairs            Wheelchair  Mobility    Modified Rankin (Stroke Patients Only)       Balance Overall balance assessment: Needs assistance Sitting-balance support: Bilateral upper extremity supported Sitting balance-Leahy Scale: Poor   Postural control: Posterior lean                                   Pertinent Vitals/Pain Pain Assessment: 0-10 Pain Score: 9  Pain Location: RLE Pain Descriptors / Indicators: Aching;Throbbing;Crying;Grimacing Pain Intervention(s): Limited activity within patient's tolerance;Monitored during session;Repositioned    Home Living Family/patient expects to be discharged to:: Skilled nursing facility Living Arrangements: Spouse/significant other Available Help at Discharge: Family;Available 24 hours/day (wife) Type of Home: House Home Access: Ramped entrance     Home Layout: One level Home Equipment: Wheelchair - manual;Cane - Set designer - 2 wheels      Prior Function Level of Independence: Independent with assistive device(s)         Comments: Was using WC outside home and RW inside home     Hand Dominance   Dominant Hand: Right    Extremity/Trunk Assessment               Lower Extremity Assessment: RLE deficits/detail;LLE deficits/detail RLE Deficits / Details: s/p R BKA LLE Deficits / Details: previous L AKA     Communication   Communication: No difficulties  Cognition Arousal/Alertness: Awake/alert Behavior During Therapy: Flat affect Overall Cognitive Status: Within Functional Limits for tasks assessed  General Comments General comments (skin integrity, edema, etc.): Pt has LLE prosthsesis and will attempt sit>stand transfer next session as appropriate.  Discussed pt providing tactile stimulation to RLE periodically throughout day, pt verbalized understanding and says he remembers from his previous amputation.    Exercises        Assessment/Plan    PT Assessment Patient needs continued PT  services  PT Diagnosis Difficulty walking;Abnormality of gait;Generalized weakness;Acute pain   PT Problem List Decreased strength;Decreased range of motion;Decreased activity tolerance;Decreased balance;Decreased mobility;Decreased coordination;Decreased knowledge of use of DME;Decreased safety awareness;Decreased knowledge of precautions;Decreased skin integrity;Pain;Impaired sensation  PT Treatment Interventions DME instruction;Gait training;Stair training;Functional mobility training;Therapeutic activities;Therapeutic exercise;Balance training;Neuromuscular re-education;Patient/family education;Modalities;Wheelchair mobility training   PT Goals (Current goals can be found in the Care Plan section) Acute Rehab PT Goals Patient Stated Goal: to feel better PT Goal Formulation: With patient/family Time For Goal Achievement: 05/20/15 Potential to Achieve Goals: Good    Frequency Min 3X/week   Barriers to discharge        Co-evaluation               End of Session   Activity Tolerance: Patient limited by pain Patient left: in chair;with call bell/phone within reach;with family/visitor present Nurse Communication: Mobility status;Precautions;Weight bearing status;Other (comment) (AP transfer technique)    Functional Assessment Tool Used: Clinical Judgement Functional Limitation: Mobility: Walking and moving around Mobility: Walking and Moving Around Current Status [id number]): At least 40 percent but less than 60 percent impaired, limited or restricted Mobility: Walking and Moving Around Goal Status [phone]): At least 20 percent but less than 40 percent impaired, limited or restricted    Time: 1610-1646 PT Time Calculation (min) (ACUTE ONLY): 36 min   Charges:   PT Evaluation $Initial PT Evaluation Tier I: 1 Procedure PT Treatments $Therapeutic Activity: 8-22 mins   PT G Codes:   PT G-Codes **NOT FOR INPATIENT CLASS** Functional Assessment Tool Used: Clinical  Judgement Functional Limitation: Mobility: Walking and moving around Mobility: Walking and Moving Around Current Status [id number]): At least 40 percent but less than 60 percent impaired, limited or restricted Mobility: Walking and Moving Around Goal Status [id number]): At least 20 percent but less than 40 percent impaired, limited or restricted   Joslyn Hy PT, DPT [phone] Pager: [phone] 05/11/2015, 5:09 PM

## 2015-05-11 NOTE — Progress Notes (Signed)
[facility]  Patient Status: Active (receiving services up to time of hospitalization)  [facility] is providing the following services: RN  If patient discharges after hours, please call [phone].   Consepcion Hearing 05/11/2015, 10:12 AM

## 2015-05-11 NOTE — Care Management Note (Signed)
Case Management Note  Patient Details  Name: [name] MRN: [id number] Date of Birth: 1951/03/10  Subjective/Objective:       s/p R BKA w/ previous L AKA.             Action/Plan:  Disharge to SNF  Expected Discharge Date:                  Expected Discharge Plan:  Skilled Nursing Facility  In-House Referral:  Clinical Social Work  Discharge planning Services  CM Consult  Post Acute Care Choice:    Choice offered to:     DME Arranged:    DME Agency:     HH Arranged:    [place] Agency:     Status of Service:  In process, will continue to follow  Medicare Important Message Given:    Date Medicare IM Given:    Medicare IM give by:    Date Additional Medicare IM Given:    Additional Medicare Important Message give by:     If discussed at [place] of Stay Meetings, dates discussed:    Additional Comments:  Dimas Aguas, RN 05/11/2015, 6:08 PM

## 2015-05-11 NOTE — Op Note (Signed)
NAMEERBY, CANSECO NO.:  [id number]  MEDICAL RECORD NO.:  [id number]  LOCATION:  5N05C                        FACILITY:  [facility]  PHYSICIAN:  Rayaan Garguilo C. Lorin Mercy, M.D.    DATE OF BIRTH:  08-07-1951  DATE OF PROCEDURE:  05/10/2015 DATE OF DISCHARGE:                              OPERATIVE REPORT   PREOPERATIVE DIAGNOSES:  Right fifth ray dehiscence, peripheral arterial disease, diabetes.  POSTOPERATIVE DIAGNOSES:  Right fifth ray dehiscence, peripheral arterial disease, diabetes.  PROCEDURE:  Right below-knee amputation.  SURGEON:  Juana Montini C. Lorin Mercy, M.D.  ANESTHESIA:  General.  TOURNIQUET TIME:  Less than 1 hour.  EBL:  150 mL.  DESCRIPTION OF PROCEDURE:  After standard prepping and draping, Ancef prophylaxis, proximal thigh tourniquet been applied, DuraPrep was used. Time-out procedure was completed.  Sterile skin marker was used.  The patient had extremely large calf and massive calf muscles.  A posterior flap was drawn out with a skin marker.  6-7 fingerbreadths below the tibial tubercle, a transverse incision was made to the mid position medial and lateral, then posterior flap was made.  Anterior compartment muscles were divided.  Nerves were put on stretch.  Margins were double ligated.  He had extremely calcified vessels with 60-80% narrowing of all arterial vessels.  Veins were double ligated.  Tibia was cut with an oscillating saw.  Anterior aspect was beveled, and then using Croson handle, it was smoothed until the end of the bone was palpable.  Fibula was cut 2 cm proximal to the tibia.  Posterior flap was then prepared using the amputation knife posterior to the tibia and divided fibula using a bone hook on the tibia.  Gastrocsoleus muscle was massive, thick, and posterior compartment was divided just leaving the gastrocs. The Achilles tendon was pulled forward to cover the anterior aspect of the tibia.  Some of the muscle had to be thinned down in  order to get it closed.  Bone cut was at desirable length and thinning of the muscles continued to be done until flap could be closed without undue tension. A #1 Vicryl was used for reapproximation of the fascia, 2-0 Vicryl in subcutaneous tissue, 2-0 nylon far-near and near-far sutures interrupted with a few simple sutures in between for closure of the skin.  The skin was not under undue tension.  Adaptic, 4x4s, ABDs, Kerlix, and Ace wraps were applied for postoperative dressing.  Instrument count and needle count was correct.  The patient tolerated the procedure well and transferred to recovery room in stable condition.     Avri Paiva C. Lorin Mercy, M.D.     MCY/MEDQ  D:  05/10/2015  T:  05/11/2015  Job:  [id number]

## 2015-05-12 ENCOUNTER — Encounter (HOSPITAL_COMMUNITY): Payer: Self-pay | Admitting: General Practice

## 2015-05-12 LAB — BASIC METABOLIC PANEL
ANION GAP: 7 (ref 5–15)
BUN: 21 mg/dL — ABNORMAL HIGH (ref 6–20)
CALCIUM: 7.9 mg/dL — AB (ref 8.9–10.3)
CO2: 26 mmol/L (ref 22–32)
CREATININE: 1.75 mg/dL — AB (ref 0.61–1.24)
Chloride: 101 mmol/L (ref 101–111)
GFR calc non Af Amer: 40 mL/min — ABNORMAL LOW (ref >60)
GFR, EST AFRICAN AMERICAN: 46 mL/min — AB (ref >60)
Glucose, Bld: 170 mg/dL — ABNORMAL HIGH (ref 65–99)
Potassium: 4 mmol/L (ref 3.5–5.1)
Sodium: 134 mmol/L — ABNORMAL LOW (ref 135–145)

## 2015-05-12 LAB — GLUCOSE, CAPILLARY
GLUCOSE-CAPILLARY: 152 mg/dL — AB (ref 65–99)
GLUCOSE-CAPILLARY: 128 mg/dL — AB (ref 65–99)
Glucose-Capillary: 136 mg/dL — ABNORMAL HIGH (ref 65–99)
Glucose-Capillary: 172 mg/dL — ABNORMAL HIGH (ref 65–99)

## 2015-05-12 MED ORDER — POLYETHYLENE GLYCOL 3350 17 G PO PACK
17.0000 g | PACK | Freq: Every day | ORAL | Status: DC
  Administered 2015-05-12 – 2015-05-14 (×3): 17 g via ORAL
  Filled 2015-05-12 (×3): qty 1

## 2015-05-12 NOTE — Consult Note (Signed)
Physical Medicine and Rehabilitation Consult  Reason for Consult: R-BKA due to PAD and poorly healing wound. H/o L-AKA.  Referring Physician: Dr. Lorin Mercy.    HPI: [name] is a 65 y.o. 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 He was admitted on 05/10/15 for R-BKA by Dr. Lorin Mercy. post op limited by pain control issues as well as L-AKA.  Patient was independent and working PTA  with use of left prosthesis and cane. Therapy ongoing and CIR recommended for follow up therapy.    Review of Systems  HENT: Negative for hearing loss.   Eyes: Negative for blurred vision and double vision.  Respiratory: Negative for cough and shortness of breath.   Cardiovascular: Negative for chest pain and palpitations.  Gastrointestinal: Positive for nausea. Negative for heartburn and abdominal pain.  Genitourinary: Negative for dysuria and urgency.  Musculoskeletal: Positive for myalgias (bilateral shoulders). Negative for back pain.  Neurological: Positive for sensory change (bilateral hands) and weakness. Negative for dizziness and focal weakness.  Psychiatric/Behavioral: Negative for depression. The patient does not have insomnia.       Past Medical History  Diagnosis Date  . Hypertension   . Hyperlipidemia   . Necrosis     #2 nail   . Myocardial infarction     "mild" heart attack  . Poor circulation of extremity   . Sleep apnea     uses cpap, getting a new one  . Type 2 Diabetes mellitus     Type 2  . Arthritis   . Pneumonia     as a child  . CKD (chronic kidney disease) 06/28/2014    Sees Dr Florene Glen    Past Surgical History  Procedure Laterality Date  . Above knee leg amputation Left 2004  . Cataract extraction w/phaco  09/05/2012    Procedure: CATARACT EXTRACTION PHACO AND INTRAOCULAR LENS PLACEMENT (IOC);  Surgeon: Tonny Branch, MD;  Location: [facility];  Service:  Ophthalmology;  Laterality: Left;  CDE=5.45  . Cataract extraction w/phaco  10/03/2012    Procedure: CATARACT EXTRACTION PHACO AND INTRAOCULAR LENS PLACEMENT (IOC);  Surgeon: Tonny Branch, MD;  Location: [facility];  Service: Ophthalmology;  Laterality: Right;  CDE: 12.31  . Colonoscopy    . Amputation Right 04/28/2015    Procedure: AMPUTATION RAY, RIGHT 5TH TOE;  Surgeon: Marybelle Killings, MD;  Location: [facility];  Service: Orthopedics;  Laterality: Right;  . Amputation Right 05/10/2015    Procedure: Right Below Knee Amputation;  Surgeon: Marybelle Killings, MD;  Location: [facility];  Service: Orthopedics;  Laterality: Right;    Family History  Problem Relation Age of Onset  . Diabetes Mother   . Hypertension Mother   . Cancer Father   . Cancer Sister   . Sickle cell anemia Daughter     Social History:  Married. Independent. 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 Wife retired and can provide assistance past discharge. He reports that he has never smoked. He has never used smokeless tobacco. He reports that he does not drink alcohol or use illicit drugs.    Allergies  Allergen Reactions  . Zolpidem Tartrate Other (See Comments)    disorientation     Facility-administered medications prior to admission  Medication Dose Route Frequency Provider Last Rate Last Dose  . methylPREDNISolone acetate (DEPO-MEDROL) injection 40 mg  40 mg Intramuscular Once  Sharion Balloon, FNP   40 mg at 05/04/15 1130   Medications Prior to Admission  Medication Sig Dispense Refill  . acetaminophen (TYLENOL) 500 MG tablet Take 500 mg by mouth every 6 (six) hours as needed (pain).     Marland Kitchen amoxicillin-clavulanate (AUGMENTIN) 875-125 MG per tablet Take 1 tablet by mouth 2 (two) times daily. Restarted 05/06/15 (approx 4 weeks left of 6 week course)    . atenolol (TENORMIN) 25 MG tablet Take 1 tablet (25 mg total) by mouth daily. 30 tablet 3  . benzonatate (TESSALON) 200 MG capsule Take 1 capsule (200 mg total) by mouth 3 (three) times  daily as needed. (Patient taking differently: Take 200 mg by mouth 3 (three) times daily as needed for cough. ) 30 capsule 1  . Calcium Carb-Cholecalciferol (CALCIUM PLUS VITAMIN D3) 600-500 MG-UNIT CAPS Take 1 capsule by mouth daily.     . cilostazol (PLETAL) 100 MG tablet Take 1 tablet (100 mg total) by mouth 2 (two) times daily. 60 tablet 3  . clopidogrel (PLAVIX) 75 MG tablet Take 1 tablet (75 mg total) by mouth daily. 30 tablet 3  . Dextromethorphan-Guaifenesin ([employer] CHEST CONGESTION RELIEF DM) 20-400 MG TABS Take 1 tablet by mouth every 4 (four) hours as needed (cough/congestion).    Marland Kitchen exenatide (BYETTA) 10 MCG/0.04ML SOPN injection Inject 10 mcg into the skin 2 (two) times daily with a meal.    . furosemide (LASIX) 40 MG tablet Take 1 tablet (40 mg total) by mouth daily. 30 tablet 3  . HYDROcodone-homatropine (HYCODAN) 5-1.5 MG/5ML syrup Take 5 mLs by mouth every 8 (eight) hours as needed for cough. 120 mL 0  . insulin aspart (NOVOLOG FLEXPEN) 100 UNIT/ML FlexPen Inject 8-10 Units into the skin 3 (three) times daily with meals. CBG <150 8 units, 150-200 9 units, 200-250 10 units    . LANTUS SOLOSTAR 100 UNIT/ML Solostar Pen Inject 34 Units into the skin at bedtime. 15 mL 2  . oxyCODONE-acetaminophen (ROXICET) 5-325 MG per tablet Take 1 tablet by mouth every 6 (six) hours as needed for severe pain. 60 tablet 0  . rosuvastatin (CRESTOR) 20 MG tablet Take 1 tablet (20 mg total) by mouth daily. 30 tablet 3  . B-D UF III MINI PEN NEEDLES 31G X 5 MM MISC 4 (four) times daily. as directed  11    Home: Home Living Family/patient expects to be discharged to:: Skilled nursing facility Living Arrangements: Spouse/significant other Available Help at Discharge: Family, Available 24 hours/day (wife) Type of Home: House Home Access: Ramped entrance [place]: One level Bathroom Shower/Tub: [place]: Wheelchair - manual, [place] - quad, Environmental consultant - 2 wheels, Tub bench, Hand held shower  head, Grab bars - toilet  Functional History: Prior Function Level of Independence: Independent with assistive device(s) Comments: Was using WC outside home and RW inside home Functional Status:  Mobility: Bed Mobility Overal bed mobility: Needs Assistance Bed Mobility: Supine to Sit Supine to sit: Mod assist, HOB elevated General bed mobility comments: Mod assist w/ trunk posteriorly w/ pt leaning posteriorly.  Uses bed rail to pull up to sitting while turning. Transfers Overall transfer level: Needs assistance Equipment used: None Transfers: Government social research officer transfers: Mod assist General transfer comment: Mod A for ant-post transfer assist w/ trunk posteriorly.  BLEs supported ant by bed.      ADL: ADL Overall ADL's : Needs assistance/impaired Grooming: Set up Upper Body Bathing: Set up, Bed level Lower Body Bathing: Moderate assistance, Bed  level Upper Body Dressing : Minimal assistance, Bed level Upper Body Dressing Details (indicate cue type and reason): loses balance posteriorly at times when unsupported Lower Body Dressing: Moderate assistance, Bed level Toilet Transfer: Minimal assistance, Anterior/posterior (simulated with recliner) Toileting- Clothing Manipulation and Hygiene: Moderate assistance Toileting - Clothing Manipulation Details (indicate cue type and reason): lateral leans Functional mobility during ADLs: Minimal assistance (ant/post only. would need +2 for sit - stand) General ADL Comments: Increased time spent discussing home layout and discharge plan. Pt is at high risk for falls and safest transfer technique is ant/posterior transfer   Cognition: Cognition Overall Cognitive Status: Within Functional Limits for tasks assessed Orientation Level: Oriented X4 Cognition Arousal/Alertness: Awake/alert Behavior During Therapy: Flat affect Overall Cognitive Status: Within Functional Limits for tasks assessed   Blood pressure  101/61, pulse 70, temperature 98.5 F (36.9 C), temperature source Oral, resp. rate 16, height 6' (1.829 m), weight 111.273 kg (245 lb 5 oz), SpO2 96 %. Physical Exam  Nursing note and vitals reviewed. Constitutional: He is oriented to person, place, and time. He appears well-developed and well-nourished.  Up in chair--fatigued appearing with reports of nausea.   HENT:  Head: Normocephalic and atraumatic.  Eyes: Conjunctivae are normal. Pupils are equal, round, and reactive to light.  Neck: Normal range of motion. Neck supple.  Cardiovascular: Normal rate and regular rhythm.   Respiratory: Effort normal and breath sounds normal. No respiratory distress. He has no wheezes.  GI: Soft. Bowel sounds are normal. He exhibits no distension. There is no tenderness.  Musculoskeletal:  L-AKA well healed. R-BKA with KI in place.   Neurological: He is alert and oriented to person, place, and time.  Speech clear. Pleasant and appropriate. Able to follows commands without difficulty.   Skin: Skin is warm and dry. No rash noted. No erythema.    Results for orders placed or performed during the hospital encounter of 05/10/15 (from the past 24 hour(s))  Glucose, capillary     Status: Abnormal   Collection Time: 05/11/15 12:13 PM  Result Value Ref Range   Glucose-Capillary 168 (H) 65 - 99 mg/dL  Glucose, capillary     Status: Abnormal   Collection Time: 05/11/15  4:23 PM  Result Value Ref Range   Glucose-Capillary 119 (H) 65 - 99 mg/dL   Comment 1 Notify RN   Glucose, capillary     Status: Abnormal   Collection Time: 05/11/15  9:49 PM  Result Value Ref Range   Glucose-Capillary 238 (H) 65 - 99 mg/dL  Basic metabolic panel     Status: Abnormal   Collection Time: 05/12/15  4:20 AM  Result Value Ref Range   Sodium 134 (L) 135 - 145 mmol/L   Potassium 4.0 3.5 - 5.1 mmol/L   Chloride 101 101 - 111 mmol/L   CO2 26 22 - 32 mmol/L   Glucose, Bld 170 (H) 65 - 99 mg/dL   BUN 21 (H) 6 - 20 mg/dL    Creatinine, Ser 1.75 (H) 0.61 - 1.24 mg/dL   Calcium 7.9 (L) 8.9 - 10.3 mg/dL   GFR calc non Af Amer 40 (L) >60 mL/min   GFR calc Af Amer 46 (L) >60 mL/min   Anion gap 7 5 - 15  Glucose, capillary     Status: Abnormal   Collection Time: 05/12/15  6:48 AM  Result Value Ref Range   Glucose-Capillary 152 (H) 65 - 99 mg/dL  Glucose, capillary     Status: Abnormal   Collection  Time: 05/12/15 11:26 AM  Result Value Ref Range   Glucose-Capillary 128 (H) 65 - 99 mg/dL   Comment 1 Notify RN    No results found.  Assessment/Plan: Diagnosis: s/p right BKA (hx of left AKA) 1. Does the need for close, 24 hr/day medical supervision in concert with the patient's rehab needs make it unreasonable for this patient to be served in a less intensive setting? Yes 2. Co-Morbidities requiring supervision/potential complications: dm, ckd, hypotension 3. Due to bladder management, bowel management, safety, skin/wound care, disease management, medication administration, pain management and patient education, does the patient require 24 hr/day rehab nursing? Yes 4. Does the patient require coordinated care of a physician, rehab nurse, PT (1-2 hrs/day, 5 days/week) and OT (1-2 hrs/day, 5 days/week) to address physical and functional deficits in the context of the above medical diagnosis(es)? Yes Addressing deficits in the following areas: balance, endurance, locomotion, strength, transferring, bowel/bladder control, bathing, dressing, feeding, grooming, toileting and psychosocial support 5. Can the patient actively participate in an intensive therapy program of at least 3 hrs of therapy per day at least 5 days per week? Yes 6. The potential for patient to make measurable gains while on inpatient rehab is excellent 7. Anticipated functional outcomes upon discharge from inpatient rehab are modified independent  with PT, modified independent with OT, n/a with SLP. 8. Estimated rehab length of stay to reach the above  functional goals is: 7-10 days 9. Does the patient have adequate social supports and living environment to accommodate these discharge functional goals? Yes 10. Anticipated D/C setting: Home 11. Anticipated post D/C treatments: Page therapy 12. Overall Rehab/Functional Prognosis: excellent  RECOMMENDATIONS: This patient's condition is appropriate for continued rehabilitative care in the following setting: CIR Patient has agreed to participate in recommended program. Yes Note that insurance prior authorization may be required for reimbursement for recommended care.  Comment: Rehab Admissions Coordinator to follow up.  Thanks,  Meredith Staggers, MD, Mellody Drown     05/12/2015

## 2015-05-12 NOTE — Progress Notes (Signed)
OT Cancellation Note  Patient Details Name: [name] MRN: [id number] DOB: [birth year]   Cancelled Treatment:    Reason Eval/Treat Not Completed: Other (comment) Pt D/C plan is SNF. No apparent immediate acute care OT needs, therefore will defer OT to SNF. If OT eval is needed please call Acute Rehab Dept. at [phone] or text page OT at [phone].  [place], OTR/L  [id number] 05/12/2015 05/12/2015, 7:50 AM

## 2015-05-12 NOTE — Progress Notes (Signed)
Occupational Therapy Evaluation Patient Details Name: [name] MRN: [id number] DOB: [birth year] Today's Date: 05/12/2015    History of Present Illness Pt is a 64 y/o M s/p R BKA w/ previous L AKA. Pt's PMH includes HTN, MI, DM II.   Clinical Impression   PTA, pt mod I with ADL and mobility using prosthetic LLE and cane. Pt presents with significant functional decline and is an excellent CIR candidate. Pt at high risk for falls. Pt able to complete ant/post transfers with min A but is unable to stand with +2 Max A. Mod A with LB ADL. Pt very motivated to return to PLOF. Pt will benefit from CIR to maximize functional level of independence with ADL and mobility to facilitate safe D/C home with initial 24/7 S of supportive wife. Will follow acutely to address established goals. Will contact amputee support group. Discussed need for CIR with case manager    Follow Up Recommendations  CIR;Supervision/Assistance - 24 hour    Equipment Recommendations  Other (comment);3 in 1 bedside comode (Has w/c on order. Needs amputee pad; drop arm BSC)    Recommendations for Other Services Rehab consult  Amputee support group consult made     Precautions / Restrictions Precautions Precautions: Fall Precaution Comments: L AKA Required Braces or Orthoses: Other Brace/Splint Other Brace/Splint: L prosthesis for mobility Restrictions Weight Bearing Restrictions: Yes RLE Weight Bearing: Non weight bearing      Mobility Bed Mobility Overal bed mobility: Needs Assistance Bed Mobility: Supine to Sit     Supine to sit: HOB elevated;Min assist     General bed mobility comments: difficulty with maintaining upright trunk support  - posterior bias and L lat lean at times  Transfers Overall transfer level: Needs assistance Equipment used: None         Anterior-Posterior transfers: Min assist   General transfer comment: Min A for ant-post transfer assist w/ trunk posteriorly.  BLEs supported  ant by bed. Pt unable to lift buttucks off bed at this time during "chari push up" due to weakness and pain    Balance     Sitting balance-Leahy Scale: Fair   Postural control: Posterior lean (more difficulty with unstable surface (bed)) Standing balance support:  (not assessed due to needing +2)                                ADL Overall ADL's : Needs assistance/impaired     Grooming: Set up   Upper Body Bathing: Set up;Bed level   Lower Body Bathing: Moderate assistance;Bed level   Upper Body Dressing : Minimal assistance;Bed level Upper Body Dressing Details (indicate cue type and reason): loses balance posteriorly at times when unsupported Lower Body Dressing: Moderate assistance;Bed level   Toilet Transfer: Minimal assistance;Anterior/posterior (simulated with recliner)   Toileting- Clothing Manipulation and Hygiene: Moderate assistance Toileting - Clothing Manipulation Details (indicate cue type and reason): lateral leans     Functional mobility during ADLs: Minimal assistance (ant/post only. would need +2 for sit - stand) General ADL Comments: Increased time sent discussing home layout and discharge plan. Pt is at high risk for falls and safest transfer technique at this time is ant/posterior transfer. This limits pt at home with functional mobility and ADL. PTA,pt was functioanl ambulator with a cane. Wife expressed concerns over pt D/C home rather than rehab. Discussed options regarding DME and proper set up.  Pertinent Vitals/Pain Pain Assessment: 0-10 Pain Score: 5  Pain Location: RLE Pain Descriptors / Indicators: Aching (phantom pain)     Hand Dominance Right   Extremity/Trunk Assessment Upper Extremity Assessment Upper Extremity Assessment: Generalized weakness   Lower Extremity Assessment RLE Deficits / Details: s/p R BKA LLE Deficits / Details: previous L AKA   Cervical / Trunk Assessment Cervical / Trunk  Assessment: Normal   Communication Communication Communication: No difficulties   Cognition Arousal/Alertness: Awake/alert Behavior During Therapy: Flat affect Overall Cognitive Status: Within Functional Limits for tasks assessed                     General Comments   Discussed support group with pt. Pt stated he was interested in talking with someone. Pt became tearful at times discussing his new loss/amputation.     Exercises Exercises: Other exercises Other Exercises Other Exercises: chair push ups. Pt unable to lift buttucks from chair and maintain NWB status RLE   Shoulder Instructions      Home Living Family/patient expects to be discharged to:: Skilled nursing facility Living Arrangements: Spouse/significant other Available Help at Discharge: Family;Available 24 hours/day (wife) Type of Home: House Home Access: Ramped entrance     Home Layout: One level     Bathroom Shower/Tub: Walk-in shower         Home Equipment: Wheelchair - manual;Cane - Set designer - 2 wheels;Tub bench;Hand held shower head;Grab bars - toilet          Prior Functioning/Environment Level of Independence: Independent with assistive device(s)        Comments: Was using WC outside home and RW inside home since ray amputation. Prior to this, pt was ambulating @ cane level.    OT Diagnosis: Generalized weakness;Acute pain   OT Problem List: Decreased strength;Decreased activity tolerance;Decreased range of motion;Impaired balance (sitting and/or standing);Decreased knowledge of use of DME or AE;Decreased knowledge of precautions;Obesity;Pain   OT Treatment/Interventions: Self-care/ADL training;Therapeutic exercise;DME and/or AE instruction;Therapeutic activities;Patient/family education;Balance training    OT Goals(Current goals can be found in the care plan section) Acute Rehab OT Goals Patient Stated Goal: to feel better OT Goal Formulation: With patient/family Time For Goal  Achievement: 05/26/15 Potential to Achieve Goals: Good  OT Frequency: Min 3X/week   Barriers to D/C:            Co-evaluation              End of Session Equipment Utilized During Treatment: Right knee immobilizer Nurse Communication: Mobility status  Activity Tolerance: Patient tolerated treatment well Patient left: in chair;with call bell/phone within reach;with family/visitor present   Time: [id number] OT Time Calculation (min): 34 min Charges:  OT General Charges $OT Visit: 1 Procedure OT Evaluation $Initial OT Evaluation Tier I: 1 Procedure OT Treatments $Self Care/Home Management : 8-22 mins G-Codes: OT G-codes **NOT FOR INPATIENT CLASS** Functional Assessment Tool Used: clinical judgement Functional Limitation: Self care Self Care Current Status [id number]): At least 60 percent but less than 80 percent impaired, limited or restricted Self Care Goal Status [id number]): At least 1 percent but less than 20 percent impaired, limited or restricted  Saquan Furtick,HILLARY 05/12/2015, 12:10 PM   [facility], OTR/L  [phone] 05/12/2015

## 2015-05-12 NOTE — Progress Notes (Signed)
I will begin insurance authorization for a possible inpt rehab admission pending [employer] approval. I will follow up tomorrow. [id number]

## 2015-05-12 NOTE — Progress Notes (Signed)
Physical Therapy Treatment Patient Details Name: [name] MRN: [id number] DOB: August 30, 1951 Today's Date: 05/12/2015    History of Present Illness Pt is a 64 y/o M s/p R BKA w/ previous L AKA. Pt's PMH includes HTN, MI, DM II.    PT Comments    See transfer notes below as pt will likely be most appropriate for attempt using Stedy next session for sit>stand transfer.  Pt demonstrated ability to don L AKA supine in bed w/ min assist for positioning and use of footboard.  Pt is highly motivated and remains appropriate for CIR.  Pt will benefit from continued skilled PT services to increase functional independence and safety.   Follow Up Recommendations  CIR     Equipment Recommendations  Other (comment);Rolling walker with 5" wheels (sliding board; needs bariatric RW)    Recommendations for Other Services Rehab consult     Precautions / Restrictions Precautions Precautions: Fall Precaution Comments: L AKA Required Braces or Orthoses: Other Brace/Splint Other Brace/Splint: L prosthesis for mobility Restrictions Weight Bearing Restrictions: Yes RLE Weight Bearing: Non weight bearing    Mobility  Bed Mobility Overal bed mobility: Needs Assistance Bed Mobility: Sit to Supine;Supine to Sit     Supine to sit: HOB elevated;Min assist Sit to supine: Min assist   General bed mobility comments: Min assist at times as pt has tendency to lean posteriorly 2/2 impaired balance.  Use of bed rails, increased time.  Transfers Overall transfer level: Needs assistance Equipment used: None Transfers: Comptroller transfers: Mod assist   General transfer comment: Mod A for ant-post transfer assist w/ trunk posteriorly.  BLEs supported ant by bed.  Attempted sitting EOB w/ bed elevated and donning prosthesis once standing;however pt not able to perform safely 2/2 difficulty pushing into upright position.  With min assist positioning L prosthesis  supine in bed w/ HOB elevated pt was able to use footboard of bed to push prosthesis on.  Again, sit>stand was attempted w/ prosthesis on but pt unable to have both bed high enough and foot flat on floor to push to standing.  Therefore, pt will likely be appropriate for use of stedy to attempt sit>stand next session.  Ambulation/Gait                 Stairs            Wheelchair Mobility    Modified Rankin (Stroke Patients Only)       Balance Overall balance assessment: Needs assistance Sitting-balance support: Bilateral upper extremity supported Sitting balance-Leahy Scale: Fair   Postural control: Posterior lean                          Cognition Arousal/Alertness: Awake/alert Behavior During Therapy: Flat affect Overall Cognitive Status: Within Functional Limits for tasks assessed                      Exercises      General Comments General comments (skin integrity, edema, etc.): Pt may be appropriate to attempt sit>stand using Stedy next session.      Pertinent Vitals/Pain Pain Assessment: 0-10 Pain Score: 5  Pain Location: RLe Pain Descriptors / Indicators: Aching (phantom pain) Pain Intervention(s): Limited activity within patient's tolerance;Monitored during session;Repositioned    Home Living                      Prior Function  PT Goals (current goals can now be found in the care plan section) Acute Rehab PT Goals Patient Stated Goal: to feel better PT Goal Formulation: With patient/family Time For Goal Achievement: 05/20/15 Potential to Achieve Goals: Good Progress towards PT goals: Progressing toward goals    Frequency  Min 5X/week    PT Plan Frequency needs to be updated;Equipment recommendations need to be updated    Co-evaluation             End of Session Equipment Utilized During Treatment: Gait belt (L AKA prosthesis) Activity Tolerance: Patient tolerated treatment well;Patient  limited by fatigue Patient left: in chair;with call bell/phone within reach;with family/visitor present     Time: 1137-1210 PT Time Calculation (min) (ACUTE ONLY): 33 min  Charges:  $Therapeutic Activity: 23-37 mins                    G Codes:      Joslyn Hy PT, DPT [phone] Pager: [phone] 05/12/2015, 3:29 PM

## 2015-05-12 NOTE — Progress Notes (Signed)
Subjective: Doing well.  Pain controlled.     Objective: Vital signs in last 24 hours: Temp:  [98.5 F (36.9 C)-100.5 F (38.1 C)] 98.5 F (36.9 C) (07/13 [id number]) Pulse Rate:  [70-82] 70 (07/13 0632) Resp:  [16-18] 16 (07/13 [id number]) BP: (101-126)/(61-70) 101/61 mmHg (07/13 0632) SpO2:  [96 %-98 %] 96 % (07/13 [id number])  Intake/Output from previous day: 07/12 0701 - 07/13 0700 In: 935 [I.V.:935] Out: 1975 [Urine:1975] Intake/Output this shift: Total I/O In: 240 [P.O.:240] Out: -    Recent Labs  05/10/15 1352  HGB 11.7*    Recent Labs  05/10/15 1352  WBC 10.9*  RBC 3.97*  HCT 34.1*  PLT 291    Recent Labs  05/11/15 0427 05/12/15 0420  NA 136 134*  K 4.8 4.0  CL 105 101  CO2 24 26  BUN 23* 21*  CREATININE 1.46* 1.75*  GLUCOSE 119* 170*  CALCIUM 8.0* 7.9*    Recent Labs  05/10/15 1352  INR 1.37    Exam:  Alert and oriented.  Dressing c/d/i.    Assessment/Plan: Anticipate d/c home Thursday or Friday.     Lissete Maestas M 05/12/2015, 9:06 AM

## 2015-05-12 NOTE — Discharge Summary (Signed)
Patient ID: [name] MRN: [id number] DOB/AGE: 05/17/1951 64 y.o.  Admit date: 04/28/2015 Discharge date: 05/12/2015  Admission Diagnoses:  Active Problems:   Toe amputation status   Discharge Diagnoses:  Active Problems:   Toe amputation status  status post Procedure(s): AMPUTATION RAY, RIGHT 5TH TOE  Past Medical History  Diagnosis Date  . Hypertension   . Hyperlipidemia   . Necrosis     #2 nail   . Myocardial infarction     "mild" heart attack  . Poor circulation of extremity   . Sleep apnea     uses cpap, getting a new one  . Type 2 Diabetes mellitus     Type 2  . Arthritis   . Pneumonia     as a child  . CKD (chronic kidney disease) 06/28/2014    Sees Dr Florene Glen    Surgeries: Procedure(s): AMPUTATION RAY, RIGHT 5TH TOE on 04/28/2015   Consultants:    Discharged Condition: Improved  Hospital Course: [name] is an 64 y.o. male who was admitted 04/28/2015 for operative treatment of foot infection/osteomyelitis. Patient failed conservative treatments (please see the history and physical for the specifics) and had severe unremitting pain that affects sleep, daily activities and work/hobbies. After pre-op clearance, the patient was taken to the operating room on 04/28/2015 and underwent  Procedure(s): AMPUTATION RAY, RIGHT 5TH TOE.    Patient was given perioperative antibiotics: Anti-infectives    Start     Dose/Rate Route Frequency Ordered Stop   04/28/15 2200  ceFAZolin (ANCEF) IVPB 1 g/50 mL premix  Status:  Discontinued     1 g 100 mL/hr over 30 Minutes Intravenous 3 times per day 04/28/15 2034 04/29/15 1735   04/28/15 1515  ceFAZolin (ANCEF) IVPB 2 g/50 mL premix     2 g 100 mL/hr over 30 Minutes Intravenous To Short Stay 04/28/15 1508 04/28/15 1834       Patient was given sequential compression devices and early ambulation to prevent DVT.   Patient benefited maximally from hospital stay and there were no complications. At the time of discharge, the  patient was urinating/moving their bowels without difficulty, tolerating a regular diet, pain is controlled with oral pain medications and they have been cleared by PT/OT.   Recent vital signs: No data found.    Recent laboratory studies:  Recent Labs  05/10/15 1352 05/11/15 0427 05/12/15 0420  WBC 10.9*  --   --   HGB 11.7*  --   --   HCT 34.1*  --   --   PLT 291  --   --   NA 139 136 134*  K 4.4 4.8 4.0  CL 107 105 101  CO2 23 24 26   BUN 26* 23* 21*  CREATININE 1.68* 1.46* 1.75*  GLUCOSE 151* 119* 170*  INR 1.37  --   --   CALCIUM 8.9 8.0* 7.9*     Discharge Medications:     Medication List    STOP taking these medications        acetaminophen 500 MG tablet  Commonly known as:  TYLENOL     amoxicillin-clavulanate 875-125 MG per tablet  Commonly known as:  AUGMENTIN     BYETTA 10 MCG PEN 10 MCG/0.04ML Sopn injection  Generic drug:  exenatide     insulin aspart 100 UNIT/ML injection  Commonly known as:  novoLOG      TAKE these medications        atenolol 25 MG tablet  Commonly known as:  TENORMIN  Take 1 tablet (25 mg total) by mouth daily.     CALCIUM PLUS VITAMIN D3 600-500 MG-UNIT Caps  Generic drug:  Calcium Carb-Cholecalciferol  Take 1 capsule by mouth daily.     cilostazol 100 MG tablet  Commonly known as:  PLETAL  Take 1 tablet (100 mg total) by mouth 2 (two) times daily.     clopidogrel 75 MG tablet  Commonly known as:  PLAVIX  Take 1 tablet (75 mg total) by mouth daily.     furosemide 40 MG tablet  Commonly known as:  LASIX  Take 1 tablet (40 mg total) by mouth daily.     LANTUS SOLOSTAR 100 UNIT/ML Solostar Pen  Generic drug:  Insulin Glargine  Inject 34 Units into the skin at bedtime.     oxyCODONE-acetaminophen 5-325 MG per tablet  Commonly known as:  ROXICET  Take 1 tablet by mouth every 6 (six) hours as needed for severe pain.     rosuvastatin 20 MG tablet  Commonly known as:  CRESTOR  Take 1 tablet (20 mg total) by mouth  daily.        Diagnostic Studies: Dg Chest 2 View  04/28/2015   CLINICAL DATA:  Preoperative evaluation for toe amputation. History of sleep apnea and hypertension  EXAM: CHEST  2 VIEW  COMPARISON:  April 12, 2015  FINDINGS: There is slight scarring in each lung base. There is no edema or consolidation. The heart size and pulmonary vascularity are within normal limits. No adenopathy. No bone lesions.  IMPRESSION: Slight scarring in the bases.  No edema or consolidation.   Electronically Signed   By: Lowella Grip III M.D.   On: 04/28/2015 15:39   Mr Foot Right Wo Contrast  04/12/2015   CLINICAL DATA:  Cellulitis and open wound on the distal little toe.  EXAM: MRI OF THE RIGHT FOREFOOT WITHOUT CONTRAST  TECHNIQUE: Multiplanar, multisequence MR imaging was performed. No intravenous contrast was administered.  COMPARISON:  Radiographs dated 04/12/2015  FINDINGS: There is a soft tissue ulceration at the lateral aspect of the PIP joint of the little toe. There is a nonunion fracture of the proximal shaft of the proximal phalanx and a subtle fracture of the distal proximal phalanx. There is poorly defined fluid in and around the PIP joint and extending proximally along the proximal phalanx with this mall effusion in the fifth metatarsal phalangeal joint.  There are moderate arthritic changes at the second and third tarsometatarsal joints and between the bases of the first and second and second and third and third and fourth metatarsals.  There is slight nonspecific dorsal subcutaneous soft tissue edema. No  Moderate arthritis of the first metatarsal phalangeal joint with hallux valgus deformity and bunion formation.  IMPRESSION: Fractures of the proximal and distal aspects of proximal phalanx of the little toe with an overlying soft tissue ulceration. Adjacent fluid in the soft tissues could represent infection. I suspect the patient has osteomyelitis of the distal aspect of the proximal phalanx.    Electronically Signed   By: Lorriane Shire M.D.   On: 04/12/2015 09:33   US Arterial Seg Multiple  04/14/2015   CLINICAL DATA:  Left below-knee amputation. Right foot cellulitis and gangrene fifth digit. Fifth toe ulceration. Hypertension, diabetes.  EXAM: NONINVASIVE PHYSIOLOGIC VASCULAR STUDY OF BILATERAL LOWER EXTREMITIES  TECHNIQUE: Evaluation of both lower extremities was performed at rest, including calculation of ankle-brachial indices, multiple segmental pressure evaluation, segmental Doppler and segmental  pulse volume recording.  COMPARISON:  02/28/2003  FINDINGS: Right ABI:  Non calculable due to vascular noncompressibility  Left ABI:  (below-knee amputation)  Right Lower Extremity: Diffuse vascular noncompressibility probably secondary to medial calcification. Biphasic waveforms through the popliteal level, monophasic waveforms distally.  Left Lower Extremity: Biphasic waveform through the SFA. Below-knee amputation.  IMPRESSION: 1. Right lower extremity arterial occlusive disease of at least mild severity, with a significant infrapopliteal component. Evaluation limited secondary to vascular noncompressibility. Should the patient fail conservative treatment, consider CTA runoff (higher spatial resolution) or MRA runoff (no radiation risk, can be performed noncontrast in the setting of renal dysfunction) to better define the site and nature of arterial occlusive disease and delineate treatment options.   Electronically Signed   By: Lucrezia Europe M.D.   On: 04/14/2015 14:16        Discharge Instructions    Call MD / Call 911    Complete by:  As directed   If you experience chest pain or shortness of breath, CALL 911 and be transported to the hospital emergency room.  If you develope a fever above 101 F, pus (white drainage) or increased drainage or redness at the wound, or calf pain, call your surgeon's office.     Constipation Prevention    Complete by:  As directed   Drink plenty of fluids.   Prune juice may be helpful.  You may use a stool softener, such as Colace (over the counter) 100 mg twice a day.  Use MiraLax (over the counter) for constipation as needed.     Diet - low sodium heart healthy    Complete by:  As directed      Discharge instructions    Complete by:  As directed   Do not remove dressing or get wet. Elevate foot above heart level as much as possible.  Can weightbear on heel in Darco heel wedge shoe if able to get on over dressing.  Otherwise be nonweightbearing until return office visit one week.     Driving restrictions    Complete by:  As directed   No driving     Increase activity slowly as tolerated    Complete by:  As directed            Follow-up Information    Schedule an appointment as soon as possible for a visit with Marybelle Killings, MD.   Specialty:  Orthopedic Surgery   Why:  need return office visit one week.    Contact information:   [place] [id number] [phone]       Discharge Plan:  discharge to home  Disposition:     Signed: Lanae Crumbly  05/12/2015, 8:02 AM

## 2015-05-13 ENCOUNTER — Encounter: Payer: Self-pay | Admitting: Gastroenterology

## 2015-05-13 DIAGNOSIS — Z89511 Acquired absence of right leg below knee: Secondary | ICD-10-CM | POA: Diagnosis not present

## 2015-05-13 DIAGNOSIS — Z8249 Family history of ischemic heart disease and other diseases of the circulatory system: Secondary | ICD-10-CM | POA: Diagnosis not present

## 2015-05-13 DIAGNOSIS — M869 Osteomyelitis, unspecified: Secondary | ICD-10-CM | POA: Diagnosis present

## 2015-05-13 DIAGNOSIS — T8781 Dehiscence of amputation stump: Secondary | ICD-10-CM | POA: Diagnosis present

## 2015-05-13 DIAGNOSIS — E1121 Type 2 diabetes mellitus with diabetic nephropathy: Secondary | ICD-10-CM | POA: Diagnosis present

## 2015-05-13 DIAGNOSIS — Z833 Family history of diabetes mellitus: Secondary | ICD-10-CM | POA: Diagnosis not present

## 2015-05-13 DIAGNOSIS — E1159 Type 2 diabetes mellitus with other circulatory complications: Secondary | ICD-10-CM | POA: Diagnosis not present

## 2015-05-13 DIAGNOSIS — Z89612 Acquired absence of left leg above knee: Secondary | ICD-10-CM | POA: Diagnosis not present

## 2015-05-13 DIAGNOSIS — Z79899 Other long term (current) drug therapy: Secondary | ICD-10-CM | POA: Diagnosis not present

## 2015-05-13 DIAGNOSIS — N189 Chronic kidney disease, unspecified: Secondary | ICD-10-CM | POA: Diagnosis present

## 2015-05-13 DIAGNOSIS — I10 Essential (primary) hypertension: Secondary | ICD-10-CM | POA: Diagnosis not present

## 2015-05-13 DIAGNOSIS — I252 Old myocardial infarction: Secondary | ICD-10-CM | POA: Diagnosis not present

## 2015-05-13 DIAGNOSIS — D62 Acute posthemorrhagic anemia: Secondary | ICD-10-CM | POA: Diagnosis not present

## 2015-05-13 DIAGNOSIS — Z794 Long term (current) use of insulin: Secondary | ICD-10-CM | POA: Diagnosis not present

## 2015-05-13 DIAGNOSIS — I129 Hypertensive chronic kidney disease with stage 1 through stage 4 chronic kidney disease, or unspecified chronic kidney disease: Secondary | ICD-10-CM | POA: Diagnosis present

## 2015-05-13 DIAGNOSIS — E785 Hyperlipidemia, unspecified: Secondary | ICD-10-CM | POA: Diagnosis present

## 2015-05-13 DIAGNOSIS — Z7902 Long term (current) use of antithrombotics/antiplatelets: Secondary | ICD-10-CM | POA: Diagnosis not present

## 2015-05-13 DIAGNOSIS — E1151 Type 2 diabetes mellitus with diabetic peripheral angiopathy without gangrene: Secondary | ICD-10-CM | POA: Diagnosis present

## 2015-05-13 DIAGNOSIS — G4733 Obstructive sleep apnea (adult) (pediatric): Secondary | ICD-10-CM | POA: Diagnosis present

## 2015-05-13 LAB — GLUCOSE, CAPILLARY
GLUCOSE-CAPILLARY: 281 mg/dL — AB (ref 65–99)
GLUCOSE-CAPILLARY: 141 mg/dL — AB (ref 65–99)
Glucose-Capillary: 135 mg/dL — ABNORMAL HIGH (ref 65–99)
Glucose-Capillary: 258 mg/dL — ABNORMAL HIGH (ref 65–99)

## 2015-05-13 LAB — BASIC METABOLIC PANEL
Anion gap: 7 (ref 5–15)
BUN: 17 mg/dL (ref 6–20)
CALCIUM: 8 mg/dL — AB (ref 8.9–10.3)
CHLORIDE: 103 mmol/L (ref 101–111)
CO2: 26 mmol/L (ref 22–32)
Creatinine, Ser: 1.74 mg/dL — ABNORMAL HIGH (ref 0.61–1.24)
GFR calc non Af Amer: 40 mL/min — ABNORMAL LOW (ref >60)
GFR, EST AFRICAN AMERICAN: 47 mL/min — AB (ref >60)
Glucose, Bld: 152 mg/dL — ABNORMAL HIGH (ref 65–99)
POTASSIUM: 4.1 mmol/L (ref 3.5–5.1)
Sodium: 136 mmol/L (ref 135–145)

## 2015-05-13 MED ORDER — INSULIN ASPART 100 UNIT/ML ~~LOC~~ SOLN
8.0000 [IU] | Freq: Three times a day (TID) | SUBCUTANEOUS | Status: DC
  Administered 2015-05-13: 8 [IU] via SUBCUTANEOUS
  Administered 2015-05-14: 9 [IU] via SUBCUTANEOUS

## 2015-05-13 MED ORDER — OXYCODONE-ACETAMINOPHEN 10-325 MG PO TABS: 1.0000 | ORAL_TABLET | Freq: Four times a day (QID) | ORAL | Status: DC | PRN

## 2015-05-13 MED ORDER — METHOCARBAMOL 750 MG PO TABS: 750.0000 mg | ORAL_TABLET | Freq: Three times a day (TID) | ORAL | Status: DC | PRN

## 2015-05-13 NOTE — Progress Notes (Signed)
Subjective: 3 Days Post-Op Procedure(s) (LRB): Right Below Knee Amputation (Right) Patient reports pain as mild.    Objective: Vital signs in last 24 hours: Temp:  [98 F (36.7 C)-99.6 F (37.6 C)] 98.1 F (36.7 C) (07/14 0452) Pulse Rate:  [71-86] 71 (07/14 0452) Resp:  [18] 18 (07/14 0452) BP: (105-122)/(61-69) 105/61 mmHg (07/14 0452) SpO2:  [97 %-98 %] 97 % (07/14 0452)  Intake/Output from previous day: 07/13 0701 - 07/14 0700 In: 1229.6 [P.O.:840; I.V.:389.6] Out: 350 [Urine:350] Intake/Output this shift:     Recent Labs  05/10/15 1352  HGB 11.7*    Recent Labs  05/10/15 1352  WBC 10.9*  RBC 3.97*  HCT 34.1*  PLT 291    Recent Labs  05/12/15 0420 05/13/15 0433  NA 134* 136  K 4.0 4.1  CL 101 103  CO2 26 26  BUN 21* 17  CREATININE 1.75* 1.74*  GLUCOSE 170* 152*  CALCIUM 7.9* 8.0*    Recent Labs  05/10/15 1352  INR 1.37    Neurologically intact  Assessment/Plan: 3 Days Post-Op Procedure(s) (LRB): Right Below Knee Amputation (Right) Up with therapy    DRESSING CHANGED. INCISION LOOKS GOOD.  READY FOR REHAB.   Katharina Jehle C 05/13/2015, 7:48 AM

## 2015-05-13 NOTE — H&P (Signed)
Physical Medicine and Rehabilitation Admission H&P    CC:  R-BKA due to PAD and poorly healing wound. H/o L-AKA.   HPI: [name] is a 64 y.o. 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 He was admitted on 05/10/15 for R-BKA by Dr. Lorin Mercy. Post op pain improving but continues to have some phantom pain. Noted to have leucocytosis and ABLA is stable. Therapy ongoing and CIR was recommended for follow up therapy.    Review of Systems  Constitutional: Negative for fever.  HENT: Negative for hearing loss.   Eyes: Negative for blurred vision and double vision.  Respiratory: Negative for cough and shortness of breath.   Cardiovascular: Negative for chest pain and palpitations.  Gastrointestinal: Negative for heartburn, nausea, abdominal pain and constipation.  Genitourinary: Negative for dysuria and urgency.  Musculoskeletal: Positive for falls. Negative for myalgias.  Neurological: Positive for sensory change. Negative for dizziness, tingling and headaches.  Psychiatric/Behavioral: Negative for depression. The patient does not have insomnia.       Past Medical History  Diagnosis Date  . Hypertension   . Hyperlipidemia   . Necrosis     #2 nail   . Myocardial infarction     "mild" heart attack  . Poor circulation of extremity   . Sleep apnea     uses cpap, getting a new one  . Type 2 Diabetes mellitus     Type 2  . Arthritis   . Pneumonia     as a child  . CKD (chronic kidney disease) 06/28/2014    Sees Dr Florene Glen    Past Surgical History  Procedure Laterality Date  . Above knee leg amputation Left 2004  . Cataract extraction w/phaco  09/05/2012    Procedure: CATARACT EXTRACTION PHACO AND INTRAOCULAR LENS PLACEMENT (IOC);  Surgeon: Tonny Branch, MD;  Location: [facility];  Service: Ophthalmology;  Laterality: Left;  CDE=5.45  . Cataract extraction w/phaco  10/03/2012   Procedure: CATARACT EXTRACTION PHACO AND INTRAOCULAR LENS PLACEMENT (IOC);  Surgeon: Tonny Branch, MD;  Location: [facility];  Service: Ophthalmology;  Laterality: Right;  CDE: 12.31  . Colonoscopy    . Amputation Right 04/28/2015    Procedure: AMPUTATION RAY, RIGHT 5TH TOE;  Surgeon: Marybelle Killings, MD;  Location: [place];  Service: Orthopedics;  Laterality: Right;  . Amputation Right 05/10/2015    Procedure: Right Below Knee Amputation;  Surgeon: Marybelle Killings, MD;  Location: [place];  Service: Orthopedics;  Laterality: Right;    Family History  Problem Relation Age of Onset  . Diabetes Mother   . Hypertension Mother   . Cancer Father   . Cancer Sister   . Sickle cell anemia Daughter     Social History:  Married. Independent. 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 Wife retired and can provide assistance past discharge. He reports that he has never smoked. He has never used smokeless tobacco. He reports that he does not drink alcohol or use illicit drugs.    Allergies  Allergen Reactions  . Zolpidem Tartrate Other (See Comments)    disorientation     Facility-administered medications prior to admission  Medication Dose Route Frequency Provider Last Rate Last Dose  . methylPREDNISolone acetate (DEPO-MEDROL) injection 40 mg  40 mg Intramuscular Once Sharion Balloon, FNP   40 mg at 05/04/15 1130   Medications Prior to Admission  Medication Sig Dispense Refill  . acetaminophen (TYLENOL) 500 MG tablet Take 500 mg by mouth every 6 (six) hours as needed (pain).     Marland Kitchen amoxicillin-clavulanate (AUGMENTIN) 875-125 MG per tablet Take 1 tablet by mouth 2 (two) times daily. Restarted 05/06/15 (approx 4 weeks left of 6 week course)    . atenolol (TENORMIN) 25 MG tablet Take 1 tablet (25 mg total) by mouth daily. 30 tablet 3  . benzonatate (TESSALON) 200 MG capsule Take 1 capsule (200 mg total) by mouth 3 (three) times daily as needed. (Patient taking differently: Take 200 mg by mouth 3 (three) times daily  as needed for cough. ) 30 capsule 1  . Calcium Carb-Cholecalciferol (CALCIUM PLUS VITAMIN D3) 600-500 MG-UNIT CAPS Take 1 capsule by mouth daily.     . cilostazol (PLETAL) 100 MG tablet Take 1 tablet (100 mg total) by mouth 2 (two) times daily. 60 tablet 3  . clopidogrel (PLAVIX) 75 MG tablet Take 1 tablet (75 mg total) by mouth daily. 30 tablet 3  . Dextromethorphan-Guaifenesin ([employer] CHEST CONGESTION RELIEF DM) 20-400 MG TABS Take 1 tablet by mouth every 4 (four) hours as needed (cough/congestion).    Marland Kitchen exenatide (BYETTA) 10 MCG/0.04ML SOPN injection Inject 10 mcg into the skin 2 (two) times daily with a meal.    . furosemide (LASIX) 40 MG tablet Take 1 tablet (40 mg total) by mouth daily. 30 tablet 3  . HYDROcodone-homatropine (HYCODAN) 5-1.5 MG/5ML syrup Take 5 mLs by mouth every 8 (eight) hours as needed for cough. 120 mL 0  . insulin aspart (NOVOLOG FLEXPEN) 100 UNIT/ML FlexPen Inject 8-10 Units into the skin 3 (three) times daily with meals. CBG <150 8 units, 150-200 9 units, 200-250 10 units    . LANTUS SOLOSTAR 100 UNIT/ML Solostar Pen Inject 34 Units into the skin at bedtime. 15 mL 2  . oxyCODONE-acetaminophen (ROXICET) 5-325 MG per tablet Take 1 tablet by mouth every 6 (six) hours as needed for severe pain. 60 tablet 0  . rosuvastatin (CRESTOR) 20 MG tablet Take 1 tablet (20 mg total) by mouth daily. 30 tablet 3  . B-D UF III MINI PEN NEEDLES 31G X 5 MM MISC 4 (four) times daily. as directed  11    Home: Home Living Family/patient expects to be discharged to:: Private residence Living Arrangements: Spouse/significant other Available Help at Discharge: Family, Available 24 hours/day (wife) Type of Home: House Home Access: Ramped entrance [place]: One level Bathroom Shower/Tub: [place]: Wheelchair - manual, Radio producer - quad, Environmental consultant - 2 wheels, Tub bench, Hand held shower head, Grab bars - toilet   Functional History: Prior Function Level of Independence:  Independent with assistive device(s) Comments: Was using WC outside home and RW inside home  Functional Status:  Mobility: Bed Mobility Overal bed mobility: Needs Assistance Bed Mobility: Sit to Supine, Supine to Sit Supine to sit: HOB elevated, Min assist Sit to supine: Min assist General bed mobility comments: Min assist at times as pt has tendency to lean posteriorly 2/2 impaired balance.  Use of bed rails, increased time. Transfers Overall transfer level: Needs assistance Equipment used: None Transfers: Government social research officer transfers: Min assist General transfer comment: Min A with AP transfers as patient continues with tendency to lean posterior and requires A to correct. Patient with good UE support and ability to scoot himself posteiorly. Patient attemped stand with prothesis on with use of steady but due to size of steady and patietn prothesis patient was unable  to stand fully upright. Patient very motivated to atttempt however.       ADL: ADL Overall ADL's : Needs assistance/impaired Grooming: Set up Upper Body Bathing: Set up, Bed level Lower Body Bathing: Moderate assistance, Bed level Upper Body Dressing : Minimal assistance, Bed level Upper Body Dressing Details (indicate cue type and reason): loses balance posteriorly at times when unsupported Lower Body Dressing: Moderate assistance, Bed level Toilet Transfer: Minimal assistance, Anterior/posterior (simulated with recliner) Toileting- Clothing Manipulation and Hygiene: Moderate assistance Toileting - Clothing Manipulation Details (indicate cue type and reason): lateral leans Functional mobility during ADLs: Minimal assistance (ant/post only. would need +2 for sit - stand) General ADL Comments: Increased time sent discussing home layout and discharge plan. Pt is at high risk for falls and safest transfer technique at this time is ant/posterior transfer. This limits pt at home with functional  mobility. PTA,pt was functioanl ambulator with a cane. Wife expressed concerns over pt D/C home rather than rehab. Discussed options regarding DME and proper set up.   Cognition: Cognition Overall Cognitive Status: Within Functional Limits for tasks assessed Orientation Level: Oriented X4 Cognition Arousal/Alertness: Awake/alert Behavior During Therapy: WFL for tasks assessed/performed Overall Cognitive Status: Within Functional Limits for tasks assessed   Blood pressure 118/64, pulse 78, temperature 98.3 F (36.8 C), temperature source Oral, resp. rate 18, height 6' (1.829 m), weight 111.273 kg (245 lb 5 oz), SpO2 100 %. Physical Exam  Nursing note and vitals reviewed. Constitutional: He is oriented to person, place, and time. He appears well-developed and well-nourished.  HENT:  Head: Normocephalic and atraumatic.  Mouth/Throat: Oropharynx is clear and moist. No oropharyngeal exudate.  Eyes: Conjunctivae are normal. Pupils are equal, round, and reactive to light.  Neck: Normal range of motion. Neck supple. No JVD present. No tracheal deviation present. No thyromegaly present.  Cardiovascular: Normal rate, regular rhythm and normal heart sounds.  Exam reveals no friction rub.   No murmur heard. Respiratory: Effort normal and breath sounds normal. No respiratory distress. He has no wheezes. He has no rales.  GI: Soft. Bowel sounds are normal. He exhibits no distension. There is no tenderness. There is no rebound.  Musculoskeletal: He exhibits tenderness.  R-AKA with dry dressing and moderate edema. Tender to touch.  L-AKA well healed.   Lymphadenopathy:    He has no cervical adenopathy.  Neurological: He is alert and oriented to person, place, and time. He displays normal reflexes. No cranial nerve deficit. He exhibits normal muscle tone. Coordination normal.  Pleasant and appropriate. Speech clear. Able to follow commands without difficulty.  UE 5/5 delt,bicep, tircep, HI. RLE: 2/5  HF, 2/5 KE. LLE: 4/5 HF. Sensation grossly intact.   Skin: Skin is warm and dry.  Psychiatric: He has a normal mood and affect. His behavior is normal. Judgment and thought content normal.    Results for orders placed or performed during the hospital encounter of 05/10/15 (from the past 48 hour(s))  Glucose, capillary     Status: Abnormal   Collection Time: 05/12/15 11:26 AM  Result Value Ref Range   Glucose-Capillary 128 (H) 65 - 99 mg/dL   Comment 1 Notify RN   Glucose, capillary     Status: Abnormal   Collection Time: 05/12/15  4:35 PM  Result Value Ref Range   Glucose-Capillary 136 (H) 65 - 99 mg/dL   Comment 1 Notify RN   Glucose, capillary     Status: Abnormal   Collection Time: 05/12/15  9:41 PM  Result  Value Ref Range   Glucose-Capillary 172 (H) 65 - 99 mg/dL   Comment 1 Notify RN   Basic metabolic panel     Status: Abnormal   Collection Time: 05/13/15  4:33 AM  Result Value Ref Range   Sodium 136 135 - 145 mmol/L   Potassium 4.1 3.5 - 5.1 mmol/L   Chloride 103 101 - 111 mmol/L   CO2 26 22 - 32 mmol/L   Glucose, Bld 152 (H) 65 - 99 mg/dL   BUN 17 6 - 20 mg/dL   Creatinine, Ser 1.74 (H) 0.61 - 1.24 mg/dL   Calcium 8.0 (L) 8.9 - 10.3 mg/dL   GFR calc non Af Amer 40 (L) >60 mL/min   GFR calc Af Amer 47 (L) >60 mL/min    Comment: (NOTE) The eGFR has been calculated using the CKD EPI equation. This calculation has not been validated in all clinical situations. eGFR's persistently <60 mL/min signify possible Chronic Kidney Disease.    Anion gap 7 5 - 15  Glucose, capillary     Status: Abnormal   Collection Time: 05/13/15  6:21 AM  Result Value Ref Range   Glucose-Capillary 135 (H) 65 - 99 mg/dL   Comment 1 Notify RN   Glucose, capillary     Status: Abnormal   Collection Time: 05/13/15  9:20 AM  Result Value Ref Range   Glucose-Capillary 258 (H) 65 - 99 mg/dL  Glucose, capillary     Status: Abnormal   Collection Time: 05/13/15 11:15 AM  Result Value Ref Range    Glucose-Capillary 281 (H) 65 - 99 mg/dL   Comment 1 Notify RN   Glucose, capillary     Status: Abnormal   Collection Time: 05/13/15  4:04 PM  Result Value Ref Range   Glucose-Capillary 141 (H) 65 - 99 mg/dL   Comment 1 Notify RN   Glucose, capillary     Status: Abnormal   Collection Time: 05/13/15 10:09 PM  Result Value Ref Range   Glucose-Capillary 124 (H) 65 - 99 mg/dL  Glucose, capillary     Status: Abnormal   Collection Time: 05/14/15  6:55 AM  Result Value Ref Range   Glucose-Capillary 63 (L) 65 - 99 mg/dL   No results found.     Medical Problem List and Plan: 1. Functional deficits secondary to  s/p right BKA (hx of left AKA) 2.  DVT Prophylaxis/Anticoagulation: initiate Pharmaceutical: Lovenox--pt at risk for VTE given immobility 3. Pain Management: Current regimen of percocet and robaxin effective. Will continue and augment as indicated.  4. Mood: LCSW to follow for evaluation and support. Team to encourage also 5. Neuropsych: This patient is capable of making decisions on his own behalf. 6. Skin/Wound Care: Monitor wound daily. Maintain dressing and ACE wrap to promote edema control and wound healing 7. Fluids/Electrolytes/Nutrition: Monitor I/O. Encourage PO. Check labs on admission 8.  DM type 2: Po intake poor at this time. Recommended supplements from home tid- qid (which he prefers).  Monitor BS with ac/hs checks. Continue lantus insulin and custom SSI.  -adjust insulin as indicated  9. ABLA: Monitor for stability. Recheck in am.  10. HTN: Monitor bid. Continue tenormin and lasix daily. Baseline Cr 1.9-2 range.   -normotensive at present 11. OSA: CPAP at bedtime 12. CKD: follow i's and o's. Push po fluids. Recheck labs in am. 13.  Leukocytosis: no active signs of infection on exam. Check CBC in am.        Post Admission Physician Evaluation:  1. Functional deficits secondary  to s/p right BKA (hx of left AKA) 2. Patient is admitted to receive collaborative,  interdisciplinary care between the physiatrist, rehab nursing staff, and therapy team. 3. Patient's level of medical complexity and substantial therapy needs in context of that medical necessity cannot be provided at a lesser intensity of care such as a SNF. 4. Patient has experienced substantial functional loss from his/her baseline which was documented above under the "Functional History" and "Functional Status" headings.  Judging by the patient's diagnosis, physical exam, and functional history, the patient has potential for functional progress which will result in measurable gains while on inpatient rehab.  These gains will be of substantial and practical use upon discharge  in facilitating mobility and self-care at the household level. 5. Physiatrist will provide 24 hour management of medical needs as well as oversight of the therapy plan/treatment and provide guidance as appropriate regarding the interaction of the two. 6. 24 hour rehab nursing will assist with bladder management, bowel management, safety, skin/wound care, disease management, medication administration, pain management and patient education  and help integrate therapy concepts, techniques,education, etc. 7. PT will assess and treat for/with: Lower extremity strength, range of motion, stamina, balance, functional mobility, safety, adaptive techniques and equipment, NMR, pre-prosthetic education, pain control, coping skills.   Goals are: mod I at w/c level--left AK prosthesis will not help pt with transfers or mobility----may be able to use for short dx ambulation if he's strong enough to power through. 8. OT will assess and treat for/with: ADL's, functional mobility, safety, upper extremity strength, adaptive techniques and equipment, pain mgt, preo-prosthetic education, coping skills.   Goals are: mod I at w/c level. Therapy may not yet proceed with showering this patient. 9. SLP will assess and treat for/with: n/a.  Goals are:  n/a. 10. Case Management and Social Worker will assess and treat for psychological issues and discharge planning. 11. Team conference will be held weekly to assess progress toward goals and to determine barriers to discharge. 12. Patient will receive at least 3 hours of therapy per day at least 5 days per week. 13. ELOS: 7-10 days       14. Prognosis:  excellent     Meredith Staggers, MD, [facility] 05/14/2015   05/14/2015

## 2015-05-13 NOTE — Progress Notes (Signed)
Physical Therapy Treatment Patient Details Name: [name] MRN: [id number] DOB: January 15, 1951 Today's Date: 05/13/2015    History of Present Illness Pt is a 64 y/o M s/p R BKA w/ previous L AKA. Pt's PMH includes HTN, MI, DM II.    PT Comments    Patient is progressing with mobility and ability to perform AP transfers. Attempted to stand with use of stedy lift however unable due to fit of it around patient prothesis. Patient very motivated to attempt stand however just unable at this time. Continue to recommend comprehensive inpatient rehab (CIR) for post-acute therapy needs.      Follow Up Recommendations  CIR     Equipment Recommendations  Other (comment);Rolling walker with 5" wheels    Recommendations for Other Services       Precautions / Restrictions Precautions Precautions: Fall Precaution Comments: L AKA Required Braces or Orthoses: Other Brace/Splint Other Brace/Splint: L prosthesis for mobility Restrictions RLE Weight Bearing: Non weight bearing    Mobility  Bed Mobility Overal bed mobility: Needs Assistance       Supine to sit: HOB elevated;Min assist     General bed mobility comments: Min assist at times as pt has tendency to lean posteriorly 2/2 impaired balance.  Use of bed rails, increased time.  Transfers Overall transfer level: Needs assistance Equipment used: None Transfers: Comptroller transfers: Min assist   General transfer comment: Min A with AP transfers as patient continues with tendency to lean posterior and requires A to correct. Patient with good UE support and ability to scoot himself posteiorly. Patient attemped stand with prothesis on with use of steady but due to size of steady and patietn prothesis patient was unable to stand fully upright. Patient very motivated to atttempt however.   Ambulation/Gait                 Stairs            Wheelchair Mobility    Modified  Rankin (Stroke Patients Only)       Balance     Sitting balance-Leahy Scale: Fair       Standing balance-Leahy Scale: Poor                      Cognition Arousal/Alertness: Awake/alert Behavior During Therapy: WFL for tasks assessed/performed Overall Cognitive Status: Within Functional Limits for tasks assessed                      Exercises Amputee Exercises Quad Sets: AROM;Right;10 reps Straight Leg Raises: AROM;Right;10 reps    General Comments        Pertinent Vitals/Pain Pain Score: 4  Pain Location: RLE Pain Descriptors / Indicators: Aching Pain Intervention(s): Monitored during session    Home Living                      Prior Function            PT Goals (current goals can now be found in the care plan section) Progress towards PT goals: Progressing toward goals    Frequency  Min 5X/week    PT Plan Current plan remains appropriate    Co-evaluation             End of Session   Activity Tolerance: Patient tolerated treatment well Patient left: in chair;with call bell/phone within reach     Time: [id number] PT Time Calculation (  min) (ACUTE ONLY): 23 min  Charges:  $Therapeutic Exercise: 8-22 mins $Therapeutic Activity: 8-22 mins                    G Codes:      Jacqualyn Posey 05/13/2015, 10:25 AM 05/13/2015 Jacqualyn Posey PTA [phone] pager [phone] office

## 2015-05-13 NOTE — Progress Notes (Signed)
I met with pt and his wife at bedside. We discussed the possibility of an inpt rehab admission pending [employer] approval today. Wife states if unable to be approved, she will take him home, not SNF. I will follow up today. [phone]

## 2015-05-14 ENCOUNTER — Inpatient Hospital Stay (HOSPITAL_COMMUNITY)
Admission: AD | Admit: 2015-05-14 | Discharge: 2015-05-22 | DRG: 560 | Disposition: A | Discharge status: Home Health | Payer: BLUE CROSS/BLUE SHIELD | Source: Intra-hospital | Attending: Physical Medicine & Rehabilitation | Admitting: Physical Medicine & Rehabilitation

## 2015-05-14 DIAGNOSIS — I252 Old myocardial infarction: Secondary | ICD-10-CM | POA: Diagnosis not present

## 2015-05-14 DIAGNOSIS — Z79899 Other long term (current) drug therapy: Secondary | ICD-10-CM

## 2015-05-14 DIAGNOSIS — Z89612 Acquired absence of left leg above knee: Secondary | ICD-10-CM | POA: Diagnosis not present

## 2015-05-14 DIAGNOSIS — Z9842 Cataract extraction status, left eye: Secondary | ICD-10-CM

## 2015-05-14 DIAGNOSIS — Z9841 Cataract extraction status, right eye: Secondary | ICD-10-CM

## 2015-05-14 DIAGNOSIS — Z888 Allergy status to other drugs, medicaments and biological substances status: Secondary | ICD-10-CM

## 2015-05-14 DIAGNOSIS — Z4781 Encounter for orthopedic aftercare following surgical amputation: Secondary | ICD-10-CM | POA: Diagnosis present

## 2015-05-14 DIAGNOSIS — E1140 Type 2 diabetes mellitus with diabetic neuropathy, unspecified: Secondary | ICD-10-CM | POA: Diagnosis present

## 2015-05-14 DIAGNOSIS — E785 Hyperlipidemia, unspecified: Secondary | ICD-10-CM | POA: Diagnosis present

## 2015-05-14 DIAGNOSIS — I10 Essential (primary) hypertension: Secondary | ICD-10-CM

## 2015-05-14 DIAGNOSIS — N189 Chronic kidney disease, unspecified: Secondary | ICD-10-CM | POA: Diagnosis present

## 2015-05-14 DIAGNOSIS — Z794 Long term (current) use of insulin: Secondary | ICD-10-CM

## 2015-05-14 DIAGNOSIS — G4733 Obstructive sleep apnea (adult) (pediatric): Secondary | ICD-10-CM | POA: Diagnosis present

## 2015-05-14 DIAGNOSIS — Z7902 Long term (current) use of antithrombotics/antiplatelets: Secondary | ICD-10-CM | POA: Diagnosis not present

## 2015-05-14 DIAGNOSIS — Z79891 Long term (current) use of opiate analgesic: Secondary | ICD-10-CM

## 2015-05-14 DIAGNOSIS — I129 Hypertensive chronic kidney disease with stage 1 through stage 4 chronic kidney disease, or unspecified chronic kidney disease: Secondary | ICD-10-CM | POA: Diagnosis present

## 2015-05-14 DIAGNOSIS — D62 Acute posthemorrhagic anemia: Secondary | ICD-10-CM | POA: Diagnosis present

## 2015-05-14 DIAGNOSIS — Z961 Presence of intraocular lens: Secondary | ICD-10-CM | POA: Diagnosis present

## 2015-05-14 DIAGNOSIS — Z89519 Acquired absence of unspecified leg below knee: Secondary | ICD-10-CM | POA: Diagnosis present

## 2015-05-14 DIAGNOSIS — Z89511 Acquired absence of right leg below knee: Secondary | ICD-10-CM

## 2015-05-14 DIAGNOSIS — N182 Chronic kidney disease, stage 2 (mild): Secondary | ICD-10-CM | POA: Diagnosis present

## 2015-05-14 DIAGNOSIS — E1121 Type 2 diabetes mellitus with diabetic nephropathy: Secondary | ICD-10-CM | POA: Diagnosis present

## 2015-05-14 DIAGNOSIS — E1159 Type 2 diabetes mellitus with other circulatory complications: Secondary | ICD-10-CM | POA: Diagnosis not present

## 2015-05-14 LAB — GLUCOSE, CAPILLARY
GLUCOSE-CAPILLARY: 124 mg/dL — AB (ref 65–99)
GLUCOSE-CAPILLARY: 194 mg/dL — AB (ref 65–99)
Glucose-Capillary: 63 mg/dL — ABNORMAL LOW (ref 65–99)
Glucose-Capillary: 276 mg/dL — ABNORMAL HIGH (ref 65–99)
Glucose-Capillary: 110 mg/dL — ABNORMAL HIGH (ref 65–99)

## 2015-05-14 MED ORDER — ATENOLOL 25 MG PO TABS
25.0000 mg | ORAL_TABLET | Freq: Every day | ORAL | Status: DC
  Administered 2015-05-15 – 2015-05-22 (×8): 25 mg via ORAL
  Filled 2015-05-14 (×9): qty 1

## 2015-05-14 MED ORDER — CLOPIDOGREL BISULFATE 75 MG PO TABS
75.0000 mg | ORAL_TABLET | Freq: Every day | ORAL | Status: DC
  Administered 2015-05-15 – 2015-05-22 (×8): 75 mg via ORAL
  Filled 2015-05-14 (×10): qty 1

## 2015-05-14 MED ORDER — FLEET ENEMA 7-19 GM/118ML RE ENEM: 1.0000 | ENEMA | Freq: Once | RECTAL | Status: AC | PRN

## 2015-05-14 MED ORDER — GUAIFENESIN-DM 100-10 MG/5ML PO SYRP
5.0000 mL | ORAL_SOLUTION | Freq: Four times a day (QID) | ORAL | Status: DC | PRN
  Filled 2015-05-14: qty 10

## 2015-05-14 MED ORDER — PROCHLORPERAZINE MALEATE 5 MG PO TABS: 5.0000 mg | ORAL_TABLET | Freq: Four times a day (QID) | ORAL | Status: DC | PRN

## 2015-05-14 MED ORDER — INSULIN ASPART 100 UNIT/ML ~~LOC~~ SOLN: 0.0000 [IU] | Freq: Every day | SUBCUTANEOUS | Status: DC

## 2015-05-14 MED ORDER — FUROSEMIDE 40 MG PO TABS
40.0000 mg | ORAL_TABLET | Freq: Every day | ORAL | Status: DC
  Administered 2015-05-15 – 2015-05-22 (×8): 40 mg via ORAL
  Filled 2015-05-14 (×10): qty 1

## 2015-05-14 MED ORDER — ONDANSETRON HCL 4 MG/2ML IJ SOLN: 4.0000 mg | Freq: Four times a day (QID) | INTRAMUSCULAR | Status: DC | PRN

## 2015-05-14 MED ORDER — POLYETHYLENE GLYCOL 3350 17 G PO PACK
17.0000 g | PACK | Freq: Every day | ORAL | Status: DC
  Administered 2015-05-15 – 2015-05-17 (×3): 17 g via ORAL
  Filled 2015-05-14 (×10): qty 1

## 2015-05-14 MED ORDER — BISACODYL 10 MG RE SUPP: 10.0000 mg | Freq: Every day | RECTAL | Status: DC | PRN

## 2015-05-14 MED ORDER — DIPHENHYDRAMINE HCL 12.5 MG/5ML PO ELIX
12.5000 mg | ORAL_SOLUTION | Freq: Four times a day (QID) | ORAL | Status: DC | PRN
  Filled 2015-05-14: qty 10

## 2015-05-14 MED ORDER — ALUM & MAG HYDROXIDE-SIMETH 200-200-20 MG/5ML PO SUSP: 30.0000 mL | ORAL | Status: DC | PRN

## 2015-05-14 MED ORDER — ONDANSETRON HCL 4 MG PO TABS: 4.0000 mg | ORAL_TABLET | Freq: Four times a day (QID) | ORAL | Status: DC | PRN

## 2015-05-14 MED ORDER — ENOXAPARIN SODIUM 40 MG/0.4ML ~~LOC~~ SOLN
40.0000 mg | SUBCUTANEOUS | Status: DC
  Administered 2015-05-14 – 2015-05-21 (×7): 40 mg via SUBCUTANEOUS
  Filled 2015-05-14 (×9): qty 0.4

## 2015-05-14 MED ORDER — TRAZODONE HCL 50 MG PO TABS: 25.0000 mg | ORAL_TABLET | Freq: Every evening | ORAL | Status: DC | PRN

## 2015-05-14 MED ORDER — ACETAMINOPHEN 325 MG PO TABS
325.0000 mg | ORAL_TABLET | ORAL | Status: DC | PRN
  Administered 2015-05-21: 650 mg via ORAL
  Filled 2015-05-14: qty 2

## 2015-05-14 MED ORDER — METHOCARBAMOL 500 MG PO TABS
500.0000 mg | ORAL_TABLET | Freq: Four times a day (QID) | ORAL | Status: DC | PRN
  Administered 2015-05-14 – 2015-05-20 (×6): 500 mg via ORAL
  Filled 2015-05-14 (×5): qty 1

## 2015-05-14 MED ORDER — INSULIN ASPART 100 UNIT/ML ~~LOC~~ SOLN
8.0000 [IU] | Freq: Three times a day (TID) | SUBCUTANEOUS | Status: DC
  Administered 2015-05-14: 12 [IU] via SUBCUTANEOUS
  Administered 2015-05-15: 9 [IU] via SUBCUTANEOUS
  Administered 2015-05-15: 8 [IU] via SUBCUTANEOUS
  Administered 2015-05-15: 9 [IU] via SUBCUTANEOUS
  Administered 2015-05-16: 10 [IU] via SUBCUTANEOUS
  Administered 2015-05-16: 9 [IU] via SUBCUTANEOUS
  Administered 2015-05-16: 8 [IU] via SUBCUTANEOUS
  Administered 2015-05-17: 9 [IU] via SUBCUTANEOUS
  Administered 2015-05-18: 8 [IU] via SUBCUTANEOUS
  Administered 2015-05-18: 9 [IU] via SUBCUTANEOUS
  Administered 2015-05-19: 8 [IU] via SUBCUTANEOUS
  Administered 2015-05-19: 9 [IU] via SUBCUTANEOUS
  Administered 2015-05-20: 6 [IU] via SUBCUTANEOUS
  Administered 2015-05-20: 8 [IU] via SUBCUTANEOUS
  Administered 2015-05-20: 6 [IU] via SUBCUTANEOUS
  Administered 2015-05-21: 8 [IU] via SUBCUTANEOUS
  Administered 2015-05-22: 6 [IU] via SUBCUTANEOUS

## 2015-05-14 MED ORDER — INSULIN GLARGINE 100 UNIT/ML ~~LOC~~ SOLN
34.0000 [IU] | Freq: Every day | SUBCUTANEOUS | Status: DC
  Administered 2015-05-14 – 2015-05-21 (×8): 34 [IU] via SUBCUTANEOUS
  Filled 2015-05-14 (×9): qty 0.34

## 2015-05-14 MED ORDER — DOCUSATE SODIUM 100 MG PO CAPS
100.0000 mg | ORAL_CAPSULE | Freq: Two times a day (BID) | ORAL | Status: DC
  Administered 2015-05-14 – 2015-05-21 (×12): 100 mg via ORAL
  Filled 2015-05-14 (×19): qty 1

## 2015-05-14 MED ORDER — OXYCODONE-ACETAMINOPHEN 5-325 MG PO TABS
1.0000 | ORAL_TABLET | ORAL | Status: DC | PRN
  Administered 2015-05-14 – 2015-05-20 (×6): 1 via ORAL
  Filled 2015-05-14 (×7): qty 1

## 2015-05-14 MED ORDER — ROSUVASTATIN CALCIUM 20 MG PO TABS
20.0000 mg | ORAL_TABLET | Freq: Every day | ORAL | Status: DC
  Administered 2015-05-15 – 2015-05-22 (×8): 20 mg via ORAL
  Filled 2015-05-14: qty 1
  Filled 2015-05-14: qty 2
  Filled 2015-05-14 (×2): qty 1
  Filled 2015-05-14: qty 2
  Filled 2015-05-14 (×6): qty 1

## 2015-05-14 MED ORDER — OXYCODONE HCL 5 MG PO TABS
5.0000 mg | ORAL_TABLET | ORAL | Status: DC | PRN
  Administered 2015-05-15 – 2015-05-20 (×2): 5 mg via ORAL
  Filled 2015-05-14 (×3): qty 1

## 2015-05-14 MED ORDER — PROCHLORPERAZINE 25 MG RE SUPP: 12.5000 mg | Freq: Four times a day (QID) | RECTAL | Status: DC | PRN

## 2015-05-14 MED ORDER — PROCHLORPERAZINE EDISYLATE 5 MG/ML IJ SOLN: 5.0000 mg | Freq: Four times a day (QID) | INTRAMUSCULAR | Status: DC | PRN

## 2015-05-14 MED ORDER — CILOSTAZOL 100 MG PO TABS
100.0000 mg | ORAL_TABLET | Freq: Two times a day (BID) | ORAL | Status: DC
  Administered 2015-05-14 – 2015-05-22 (×16): 100 mg via ORAL
  Filled 2015-05-14 (×18): qty 1

## 2015-05-14 NOTE — Progress Notes (Signed)
Contacted Gina RN 4N - Tech has already checked his CBG - requested I hold, they will retest when their dinner/ supper trays arrive.

## 2015-05-14 NOTE — Progress Notes (Signed)
Pt admitted to unit 1645 with wife at bedside. Discussed rehab process and safety with pt and wife with verbal understanding. Pt denies pain at this time. Wife at bedside

## 2015-05-14 NOTE — Progress Notes (Signed)
Insurance has approved and inpt rehab bed available to admit pt to today. I will make the arrangements. [id number]

## 2015-05-14 NOTE — Interval H&P Note (Signed)
[name] was admitted today to Inpatient Rehabilitation with the diagnosis of right BKA.  The patient's history has been reviewed, patient examined, and there is no change in status.  Patient continues to be appropriate for intensive inpatient rehabilitation.  I have reviewed the patient's chart and labs.  Questions were answered to the patient's satisfaction. The PAPE has been reviewed and assessment remains appropriate.  [name] T 05/14/2015, 5:11 PM

## 2015-05-14 NOTE — Discharge Summary (Signed)
Patient ID: [name] MRN: [id number] DOB/AGE: 02/17/1951 64 y.o.  Admit date: 05/10/2015 Discharge date: 05/14/2015  Admission Diagnoses:  Active Problems:   S/P BKA (below knee amputation) unilateral   Discharge Diagnoses:  Active Problems:   S/P BKA (below knee amputation) unilateral  status post Procedure(s): Right Below Knee Amputation  Past Medical History  Diagnosis Date  . Hypertension   . Hyperlipidemia   . Necrosis     #2 nail   . Myocardial infarction     "mild" heart attack  . Poor circulation of extremity   . Sleep apnea     uses cpap, getting a new one  . Type 2 Diabetes mellitus     Type 2  . Arthritis   . Pneumonia     as a child  . CKD (chronic kidney disease) 06/28/2014    Sees Dr Florene Glen    Surgeries: Procedure(s): Right Below Knee Amputation on 05/10/2015   Consultants:    Discharged Condition: Improved  Hospital Course: [name] is an 64 y.o. male who was admitted 05/10/2015 for operative treatment of foot infection. . Patient failed conservative treatments (please see the history and physical for the specifics) and had severe unremitting pain that affects sleep, daily activities and work/hobbies. After pre-op clearance, the patient was taken to the operating room on 05/10/2015 and underwent  Procedure(s): Right Below Knee Amputation.    Patient was given perioperative antibiotics: Anti-infectives    Start     Dose/Rate Route Frequency Ordered Stop   05/10/15 2300  ceFAZolin (ANCEF) IVPB 1 g/50 mL premix     1 g 100 mL/hr over 30 Minutes Intravenous 3 times per day 05/10/15 2043 05/11/15 0632   05/10/15 2200  ceFAZolin (ANCEF) IVPB 1 g/50 mL premix  Status:  Discontinued     1 g 100 mL/hr over 30 Minutes Intravenous 3 times per day 05/10/15 2035 05/10/15 2043   05/10/15 1430  ceFAZolin (ANCEF) IVPB 2 g/50 mL premix     2 g 100 mL/hr over 30 Minutes Intravenous On call to O.R. 05/10/15 1333 05/10/15 1648       Patient was given  sequential compression devices and early ambulation to prevent DVT.   Patient benefited maximally from hospital stay and there were no complications. At the time of discharge, the patient was urinating/moving their bowels without difficulty, tolerating a regular diet, pain is controlled with oral pain medications and they have been cleared by PT/OT.   Recent vital signs: Patient Vitals for the past 24 hrs:  BP Temp Temp src Pulse Resp SpO2  05/14/15 1256 126/67 mmHg 98.2 F (36.8 C) - 78 18 100 %  05/13/15 2102 118/64 mmHg 98.3 F (36.8 C) Oral 78 18 100 %     Recent laboratory studies:  Recent Labs  05/12/15 0420 05/13/15 0433  NA 134* 136  K 4.0 4.1  CL 101 103  CO2 26 26  BUN 21* 17  CREATININE 1.75* 1.74*  GLUCOSE 170* 152*  CALCIUM 7.9* 8.0*     Discharge Medications:     Medication List    STOP taking these medications        acetaminophen 500 MG tablet  Commonly known as:  TYLENOL     amoxicillin-clavulanate 875-125 MG per tablet  Commonly known as:  AUGMENTIN     HYDROcodone-homatropine 5-1.5 MG/5ML syrup  Commonly known as:  HYCODAN     oxyCODONE-acetaminophen 5-325 MG per tablet  Commonly known as:  ROXICET  Replaced by:  oxyCODONE-acetaminophen 10-325 MG per tablet      TAKE these medications        atenolol 25 MG tablet  Commonly known as:  TENORMIN  Take 1 tablet (25 mg total) by mouth daily.     B-D UF III MINI PEN NEEDLES 31G X 5 MM Misc  Generic drug:  Insulin Pen Needle  4 (four) times daily. as directed     benzonatate 200 MG capsule  Commonly known as:  TESSALON  Take 1 capsule (200 mg total) by mouth 3 (three) times daily as needed.     CALCIUM PLUS VITAMIN D3 600-500 MG-UNIT Caps  Generic drug:  Calcium Carb-Cholecalciferol  Take 1 capsule by mouth daily.     cilostazol 100 MG tablet  Commonly known as:  PLETAL  Take 1 tablet (100 mg total) by mouth 2 (two) times daily.     clopidogrel 75 MG tablet  Commonly known as:  PLAVIX   Take 1 tablet (75 mg total) by mouth daily.     [employer] CHEST CONGESTION RELIEF DM 20-400 MG Tabs  Generic drug:  Dextromethorphan-Guaifenesin  Take 1 tablet by mouth every 4 (four) hours as needed (cough/congestion).     exenatide 10 MCG/0.04ML Sopn injection  Commonly known as:  BYETTA  Inject 10 mcg into the skin 2 (two) times daily with a meal.     furosemide 40 MG tablet  Commonly known as:  LASIX  Take 1 tablet (40 mg total) by mouth daily.     LANTUS SOLOSTAR 100 UNIT/ML Solostar Pen  Generic drug:  Insulin Glargine  Inject 34 Units into the skin at bedtime.     methocarbamol 750 MG tablet  Commonly known as:  ROBAXIN-750  Take 1 tablet (750 mg total) by mouth every 8 (eight) hours as needed for muscle spasms.     NOVOLOG FLEXPEN 100 UNIT/ML FlexPen  Generic drug:  insulin aspart  Inject 8-10 Units into the skin 3 (three) times daily with meals. CBG <150 8 units, 150-200 9 units, 200-250 10 units     oxyCODONE-acetaminophen 10-325 MG per tablet  Commonly known as:  PERCOCET  Take 1 tablet by mouth every 6 (six) hours as needed for pain.     rosuvastatin 20 MG tablet  Commonly known as:  CRESTOR  Take 1 tablet (20 mg total) by mouth daily.        Diagnostic Studies: Dg Chest 2 View  04/28/2015   CLINICAL DATA:  Preoperative evaluation for toe amputation. History of sleep apnea and hypertension  EXAM: CHEST  2 VIEW  COMPARISON:  April 12, 2015  FINDINGS: There is slight scarring in each lung base. There is no edema or consolidation. The heart size and pulmonary vascularity are within normal limits. No adenopathy. No bone lesions.  IMPRESSION: Slight scarring in the bases.  No edema or consolidation.   Electronically Signed   By: Lowella Grip III Bradshaw.D.   On: 04/28/2015 15:39          Follow-up Information    Schedule an appointment as soon as possible for a visit with Tom Killings, MD.   Specialty:  Orthopedic Surgery   Why:  need return office visit one week  after discharge from [facility].    Contact information:   [place]  [id number] [phone]       Discharge Plan:  discharge to [facility]  Disposition:     Signed:  OWENS,[name]  05/14/2015, 2:32 PM

## 2015-05-14 NOTE — Progress Notes (Signed)
Physical Therapy Treatment Patient Details Name: [name] MRN: [id number] DOB: 16-Jun-1951 Today's Date: 05/14/2015    History of Present Illness Pt is a 64 y/o M s/p R BKA w/ previous L AKA. Pt's PMH includes HTN, MI, DM II.    PT Comments    Patient continues to be highly motivated to progress with mobility. ABle to stand today x30secs with +2 assist for balance and stability. Continue to recommend comprehensive inpatient rehab (CIR) for post-acute therapy needs.   Follow Up Recommendations  CIR     Equipment Recommendations  Other (comment);Rolling walker with 5" wheels    Recommendations for Other Services       Precautions / Restrictions Precautions Precautions: Fall Precaution Comments: L AKA Required Braces or Orthoses: Other Brace/Splint Other Brace/Splint: L prosthesis for mobility Restrictions RLE Weight Bearing: Non weight bearing    Mobility  Bed Mobility Overal bed mobility: Needs Assistance       Supine to sit: Min assist     General bed mobility comments: Min assist at times as pt has tendency to lean posteriorly 2/2 impaired balance.  Use of bed rails, increased time.  Transfers Overall transfer level: Needs assistance Equipment used: None;2 person hand held assist   Sit to Stand: +2 physical assistance;Max assist;+2 safety/equipment     Anterior-Posterior transfers: Min assist   General transfer comment: Patient able to stand this session with +2 assist for 30 sec with assist to maintain balance and positioning of RW. A for powering up and for placement of L foot. Patient unable to stand pivot transfer due to fit of prothesis this session but was able to sit back down and AP transfer into the recliner with cues for postiioning.   Ambulation/Gait                 Stairs            Wheelchair Mobility    Modified Rankin (Stroke Patients Only)       Balance                                    Cognition  Arousal/Alertness: Awake/alert Behavior During Therapy: WFL for tasks assessed/performed Overall Cognitive Status: Within Functional Limits for tasks assessed                      Exercises Amputee Exercises Quad Sets: AROM;Right;10 reps Hip ABduction/ADduction: AROM;Right;10 reps Knee Flexion: AROM;Right;10 reps Straight Leg Raises: AROM;Right;10 reps    General Comments        Pertinent Vitals/Pain Pain Assessment: No/denies pain    Home Living                      Prior Function            PT Goals (current goals can now be found in the care plan section) Progress towards PT goals: Progressing toward goals    Frequency  Min 5X/week    PT Plan Current plan remains appropriate    Co-evaluation             End of Session Equipment Utilized During Treatment: Gait belt Activity Tolerance: Patient tolerated treatment well Patient left: in chair;with call bell/phone within reach     Time: 1029-1055 PT Time Calculation (min) (ACUTE ONLY): 26 min  Charges:  $Therapeutic Exercise: 8-22 mins $Therapeutic Activity: 8-22 mins  G Codes:      Jacqualyn Posey 05/14/2015, 1:09 PM

## 2015-05-14 NOTE — Progress Notes (Signed)
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 Date: 10/30/14 Deduct: $400 Out of Pocket Max: $600 Life Max: none CIR: 70% SNF: 70% Outpatient: $5 per visit Co-Pay: 30 visits combined Home Health: 70% Co-Pay: 30% DME: 70% Co-Pay: 30% Providers: in network  SECONDARY: none  Medicaid Application Date: Case Manager:  Disability Application Date: Case Worker:   Emergency Facilities manager Information    Name Relation Home Work Mobile   Pipkins,Sharon Spouse [phone]  [place] Daughter   [phone]     Current Medical History  Patient Admitting Diagnosis: s/p r bka, h/o l aka  History of Present Illness: [name] is a 64 y.o. 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 He was admitted on 05/10/15 for R-BKA by Dr. Lorin Mercy. post op limited by pain control issues as well as L-AKA. Patient was independent and working PTA with use of left prosthesis and cane.   Past Medical History  Past Medical History  Diagnosis Date  . Hypertension   . Hyperlipidemia   . Necrosis     #2 nail   . Myocardial infarction     "mild" heart attack  . Poor circulation of  extremity   . Sleep apnea     uses cpap, getting a new one  . Type 2 Diabetes mellitus     Type 2  . Arthritis   . Pneumonia     as a child  . CKD (chronic kidney disease) 06/28/2014    Sees Dr Florene Glen    Family History  family history includes Cancer in his father and sister; Diabetes in his mother; Hypertension in his mother; Sickle cell anemia in his daughter.  Prior Rehab/Hospitalizations:  Has the patient had major surgery during 100 days prior to admission? No  CIR with Dr. Naaman Plummer 2004  Current Medications   Current facility-administered medications:  . 0.45 % NaCl with KCl 20 mEq / L infusion, , Intravenous, Continuous, Lanae Crumbly, PA-C, Stopped at 05/12/15 1035 . 0.9 % sodium chloride infusion, , Intravenous, Continuous, Marybelle Killings, MD, Last Rate: 10 mL/hr at 05/10/15 1406 . atenolol (TENORMIN) tablet 25 mg, 25 mg, Oral, Daily, Lanae Crumbly, PA-C, 25 mg at 05/14/15 1118 . bisacodyl (DULCOLAX) suppository 10 mg, 10 mg, Rectal, Daily PRN, Lanae Crumbly, PA-C . cilostazol (PLETAL) tablet 100 mg, 100 mg, Oral, BID, Lanae Crumbly, PA-C, 100 mg at 05/14/15 1116 . clopidogrel (PLAVIX) tablet 75 mg, 75 mg, Oral, Daily, Lanae Crumbly, PA-C, 75 mg at 05/14/15 1117 . docusate sodium (COLACE) capsule 100 mg, 100 mg, Oral, BID, Lanae Crumbly, PA-C, 100 mg at 05/14/15 1118 . furosemide (LASIX) tablet 40 mg, 40 mg, Oral, Daily, Lanae Crumbly, PA-C, 40 mg at 05/14/15 1117 . HYDROmorphone (DILAUDID) injection 1 mg, 1 mg, Intravenous, Q3H PRN, Lanae Crumbly, PA-C, 1 mg at 05/12/15 0408 . insulin aspart (novoLOG) injection 8-12 Units, 8-12 Units, Subcutaneous, TID WC, Marybelle Killings, MD, 8 Units at 05/13/15 1648 . insulin glargine (LANTUS) injection  34 Units, 34 Units, Subcutaneous, QHS, Marybelle Killings, MD, 34 Units at 05/13/15 2158 . lactated ringers infusion, , Intravenous, Continuous, Kate Sable, MD . methocarbamol (ROBAXIN) tablet 750 mg,  750 mg, Oral, Q6H PRN, 750 mg at 05/14/15 1118 **OR** methocarbamol (ROBAXIN) 500 mg in dextrose 5 % 50 mL IVPB, 500 mg, Intravenous, Q6H PRN, Lanae Crumbly, PA-C . metoCLOPramide (REGLAN) tablet 5-10 mg, 5-10 mg, Oral, Q8H PRN **OR** metoCLOPramide (REGLAN) injection 5-10 mg, 5-10 mg, Intravenous, Q8H PRN, Lanae Crumbly, PA-C . ondansetron [facility]) tablet 4 mg, 4 mg, Oral, Q6H PRN **OR** ondansetron (ZOFRAN) injection 4 mg, 4 mg, Intravenous, Q6H PRN, Lanae Crumbly, PA-C . oxyCODONE-acetaminophen (PERCOCET/ROXICET) 5-325 MG per tablet 1-2 tablet, 1-2 tablet, Oral, Q4H PRN, 2 tablet at 05/14/15 1118 **AND** oxyCODONE (Oxy IR/ROXICODONE) immediate release tablet 2.5-5 mg, 2.5-5 mg, Oral, Q4H PRN, Marybelle Killings, MD, 5 mg at 05/12/15 1143 . polyethylene glycol (MIRALAX / GLYCOLAX) packet 17 g, 17 g, Oral, Daily, Marybelle Killings, MD, 17 g at 05/13/15 0949 . rosuvastatin (CRESTOR) tablet 20 mg, 20 mg, Oral, Daily, Lanae Crumbly, PA-C, 20 mg at 05/13/15 [id number]  Patients Current Diet: Diet Carb Modified Fluid consistency:: Thin; Room service appropriate?: Yes  Precautions / Restrictions Precautions Precautions: Fall Precaution Comments: L AKA Other Brace/Splint: L prosthesis for mobility Restrictions Weight Bearing Restrictions: No RLE Weight Bearing: Non weight bearing   Has the patient had 2 or more falls or a fall with injury in the past year?No  Prior Activity Level Community (5-7x/wk): works fulltime as a Regulatory affairs officer. Drives. Mod I with cane and prosthesis  Home Assistive Devices / Equipment Home Assistive Devices/Equipment: Cane (specify quad or straight), Environmental consultant (specify type), Wheelchair, CBG Meter, Crutches Home Equipment: Wheelchair - manual, Cane - quad, Environmental consultant - 2 wheels, Tub bench, Hand held shower head, Grab bars - toilet  Prior Device Use: Indicate devices/aids used by the patient prior to current illness, exacerbation or injury? cane and LE prosthesis  Prior Functional  Level Prior Function Level of Independence: Independent with assistive device(s) Comments: only recentlu used wc in community and RW at home. Was before using only cane and aka prosthesis  Self Care: Did the patient need help bathing, dressing, using the toilet or eating? Independent  Indoor Mobility: Did the patient need assistance with walking from room to room (with or without device)? Independent  Stairs: Did the patient need assistance with internal or external stairs (with or without device)? Independent  Functional Cognition: Did the patient need help planning regular tasks such as shopping or remembering to take medications? Needed some help  Current Functional Level Cognition  Overall Cognitive Status: Within Functional Limits for tasks assessed Orientation Level: Oriented X4   Extremity Assessment (includes Sensation/Coordination)  Upper Extremity Assessment: Generalized weakness  Lower Extremity Assessment: RLE deficits/detail, LLE deficits/detail RLE Deficits / Details: s/p R BKA LLE Deficits / Details: previous L AKA    ADLs  Overall ADL's : Needs assistance/impaired Grooming: Set up Upper Body Bathing: Set up, Bed level Lower Body Bathing: Moderate assistance, Bed level Upper Body Dressing : Minimal assistance, Bed level Upper Body Dressing Details (indicate cue type and reason): loses balance posteriorly at times when unsupported Lower Body Dressing: Moderate assistance, Bed level Toilet Transfer: Minimal assistance, Anterior/posterior (simulated with recliner) Toileting- Clothing Manipulation and Hygiene: Moderate assistance Toileting - Clothing Manipulation Details (indicate cue type and reason): lateral leans Functional mobility during ADLs: Minimal assistance (ant/post only. would need +2  for sit - stand) General ADL Comments: encouraged desensitization activities. contacted [facility] and gave name for AHA support group. Rehab coordinator entered  during session and notified pt of admission to CIR. Pt very excited. Pt visiting with friend who has amputation. Pt states he would like to talk with a pt who has a double amputation. Pt expressing his belief in God and His healing powers. Excellent attitude toward rehab.     Mobility  Overal bed mobility: Needs Assistance Bed Mobility: Sit to Supine, Supine to Sit Supine to sit: Min assist Sit to supine: Min assist General bed mobility comments: Min assist at times as pt has tendency to lean posteriorly 2/2 impaired balance. Use of bed rails, increased time.    Transfers  Overall transfer level: Needs assistance Equipment used: None, 2 person hand held assist Transfers: Anterior-Posterior Transfer Sit to Stand: +2 physical assistance, Max assist, +2 safety/equipment Anterior-Posterior transfers: Min assist General transfer comment: Patient able to stand this session with +2 assist for 30 sec with assist to maintain balance and positioning of RW. A for powering up and for placement of L foot. Patient unable to stand pivot transfer due to fit of prothesis this session but was able to sit back down and AP transfer into the recliner with cues for postiioning.     Ambulation / Gait / Stairs / Office manager / Balance Balance Overall balance assessment: Needs assistance Sitting-balance support: Bilateral upper extremity supported Sitting balance-Leahy Scale: Fair Postural control: Posterior lean Standing balance support: (not assessed due to needing +2) Standing balance-Leahy Scale: Poor    Special needs/care consideration Skin operative site Bowel mgmt: continent LBM 7/13 Bladder mgmt: continent Diabetic mgmt yes   Previous Home Environment Living Arrangements: Spouse/significant other Lives With: Spouse Available Help at Discharge: Family, Available 24 hours/day Type of Home: House Home Layout: One level Home Access: Ramped entrance Bathroom  Shower/Tub: Walk-in shower, Other (comment) (w/c made that can go directly in shower) Bathroom Toilet: Handicapped height Bathroom Accessibility: Yes How Accessible: Accessible via wheelchair, Accessible via walker [place]: Yes Type of [place]: [place]ie (if known): advanced care Additional Comments: 2004 aka pt then had voc rehab and made home completely handicapped accessible. Also saw Robin in OP for prosthetic training  Discharge Living Setting Plans for Discharge Living Setting: Patient's home, Lives with (comment), Other (Comment) (wife) Type of Home at Discharge: House Discharge Home Layout: One level Discharge Home Access: [place] entrance Discharge Bathroom Shower/Tub: Walk-in shower, Other (comment) (wc accessible walk in shower) Discharge Bathroom Toilet: Handicapped height Discharge Bathroom Accessibility: Yes How Accessible: Accessible via wheelchair, Accessible via walker Does the patient have any problems obtaining your medications?: No  Social/Family/Support Systems Patient Roles: Spouse, Parent, Other (Comment) (pastor of church fulltime) Contact Information: Claudette Head, wife Anticipated Caregiver: wife and two daughters Anticipated Caregiver's Contact Information: see above Ability/Limitations of Caregiver: wife retired and can provide min assist level Caregiver Availability: 24/7 Discharge Plan Discussed with Primary Caregiver: Yes Is Caregiver In Agreement with Plan?: Yes Does Caregiver/Family have Issues with Lodging/Transportation while Pt is in Rehab?: No (wife stays in hospital with pt)  Goals/Additional Needs Patient/Family Goal for Rehab: Mod I PT and OT w/c level Expected length of stay: [facility]OS 7-10 days Special Service Needs: 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 I have directed them to visit 4 pm up until 8 pm Pt/Family Agrees to Admission and  willing to participate: Yes Program Orientation Provided &  Reviewed with Pt/Caregiver Including Roles & Responsibilities: Yes  Decrease burden of Care through IP rehab admission: n/a  Possible need for SNF placement upon discharge:not anticipated. Wife reports she will take him home as he is.  Patient Condition: This patient's condition remains as documented in the consult dated 05/12/2015, in which the Rehabilitation Physician determined and documented that the patient's condition is appropriate for intensive rehabilitative care in an inpatient rehabilitation facility. Will admit to inpatient rehab today.  Preadmission Screen Completed By: Cleatrice Burke, 05/14/2015 3:18 PM ______________________________________________________________________  Discussed status with Dr. Naaman Plummer on 05/14/2015 at 1518 and received telephone approval for admission today.  Admission Coordinator: Cleatrice Burke, time [id number] Date 05/14/2015.          Cosigned by: Meredith Staggers, MD at 05/14/2015 3:40 PM  Revision History     Date/Time User Provider Type Action   05/14/2015 3:40 PM Meredith Staggers, MD Physician Cosign   05/14/2015 3:21 PM Cristina Gong, RN Rehab Admission Coordinator Sign   05/14/2015 3:19 PM Cristina Gong, RN Rehab Admission Coordinator Sign   View Details Report

## 2015-05-14 NOTE — Progress Notes (Signed)
Physical Medicine and Rehabilitation Consult  Reason for Consult: R-BKA due to PAD and poorly healing wound. H/o L-AKA.  Referring Physician: Dr. Lorin Mercy.    HPI: [name] is a 64 y.o. 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 He was admitted on 05/10/15 for R-BKA by Dr. Lorin Mercy. post op limited by pain control issues as well as L-AKA. Patient was independent and working PTA with use of left prosthesis and cane. Therapy ongoing and CIR recommended for follow up therapy.    Review of Systems  HENT: Negative for hearing loss.  Eyes: Negative for blurred vision and double vision.  Respiratory: Negative for cough and shortness of breath.  Cardiovascular: Negative for chest pain and palpitations.  Gastrointestinal: Positive for nausea. Negative for heartburn and abdominal pain.  Genitourinary: Negative for dysuria and urgency.  Musculoskeletal: Positive for myalgias (bilateral shoulders). Negative for back pain.  Neurological: Positive for sensory change (bilateral hands) and weakness. Negative for dizziness and focal weakness.  Psychiatric/Behavioral: Negative for depression. The patient does not have insomnia.      Past Medical History  Diagnosis Date  . Hypertension   . Hyperlipidemia   . Necrosis     #2 nail   . Myocardial infarction     "mild" heart attack  . Poor circulation of extremity   . Sleep apnea     uses cpap, getting a new one  . Type 2 Diabetes mellitus     Type 2  . Arthritis   . Pneumonia     as a child  . CKD (chronic kidney disease) 06/28/2014    Sees Dr Florene Glen    Past Surgical History  Procedure Laterality Date  . Above knee leg amputation Left 2004  . Cataract extraction w/phaco  09/05/2012    Procedure: CATARACT EXTRACTION PHACO AND INTRAOCULAR LENS PLACEMENT (IOC);  Surgeon: Tonny Branch, MD; Location: [facility]; Service: Ophthalmology; Laterality: Left; CDE=5.45  . Cataract extraction w/phaco  10/03/2012    Procedure: CATARACT EXTRACTION PHACO AND INTRAOCULAR LENS PLACEMENT (IOC); Surgeon: Tonny Branch, MD; Location: [facility]; Service: Ophthalmology; Laterality: Right; CDE: 12.31  . Colonoscopy    . Amputation Right 04/28/2015    Procedure: AMPUTATION RAY, RIGHT 5TH TOE; Surgeon: Marybelle Killings, MD; Location: [place]; Service: Orthopedics; Laterality: Right;  . Amputation Right 05/10/2015    Procedure: Right Below Knee Amputation; Surgeon: Marybelle Killings, MD; Location: [place]; Service: Orthopedics; Laterality: Right;    Family History  Problem Relation Age of Onset  . Diabetes Mother   . Hypertension Mother   . Cancer Father   . Cancer Sister   . Sickle cell anemia Daughter     Social History: Married. Independent. 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 Wife retired and can provide assistance past discharge. He reports that he has never smoked. He has never used smokeless tobacco. He reports that he does not drink alcohol or use illicit drugs.    Allergies  Allergen Reactions  . Zolpidem Tartrate Other (See Comments)    disorientation     Facility-administered medications prior to admission  Medication Dose Route Frequency Provider Last Rate Last Dose  . methylPREDNISolone acetate (DEPO-MEDROL) injection 40 mg 40 mg Intramuscular Once Sharion Balloon, FNP  40 mg at 05/04/15 1130   Medications Prior to Admission  Medication Sig Dispense Refill  . acetaminophen (TYLENOL) 500 MG tablet Take  500 mg by mouth every 6 (six) hours as needed (pain).     Marland Kitchen amoxicillin-clavulanate (AUGMENTIN) 875-125 MG per tablet Take 1 tablet by mouth 2 (two) times daily. Restarted 05/06/15 (approx 4 weeks left of 6 week course)    . atenolol (TENORMIN) 25 MG tablet  Take 1 tablet (25 mg total) by mouth daily. 30 tablet 3  . benzonatate (TESSALON) 200 MG capsule Take 1 capsule (200 mg total) by mouth 3 (three) times daily as needed. (Patient taking differently: Take 200 mg by mouth 3 (three) times daily as needed for cough. ) 30 capsule 1  . Calcium Carb-Cholecalciferol (CALCIUM PLUS VITAMIN D3) 600-500 MG-UNIT CAPS Take 1 capsule by mouth daily.     . cilostazol (PLETAL) 100 MG tablet Take 1 tablet (100 mg total) by mouth 2 (two) times daily. 60 tablet 3  . clopidogrel (PLAVIX) 75 MG tablet Take 1 tablet (75 mg total) by mouth daily. 30 tablet 3  . Dextromethorphan-Guaifenesin ([employer] CHEST CONGESTION RELIEF DM) 20-400 MG TABS Take 1 tablet by mouth every 4 (four) hours as needed (cough/congestion).    Marland Kitchen exenatide (BYETTA) 10 MCG/0.04ML SOPN injection Inject 10 mcg into the skin 2 (two) times daily with a meal.    . furosemide (LASIX) 40 MG tablet Take 1 tablet (40 mg total) by mouth daily. 30 tablet 3  . HYDROcodone-homatropine (HYCODAN) 5-1.5 MG/5ML syrup Take 5 mLs by mouth every 8 (eight) hours as needed for cough. 120 mL 0  . insulin aspart (NOVOLOG FLEXPEN) 100 UNIT/ML FlexPen Inject 8-10 Units into the skin 3 (three) times daily with meals. CBG <150 8 units, 150-200 9 units, 200-250 10 units    . LANTUS SOLOSTAR 100 UNIT/ML Solostar Pen Inject 34 Units into the skin at bedtime. 15 mL 2  . oxyCODONE-acetaminophen (ROXICET) 5-325 MG per tablet Take 1 tablet by mouth every 6 (six) hours as needed for severe pain. 60 tablet 0  . rosuvastatin (CRESTOR) 20 MG tablet Take 1 tablet (20 mg total) by mouth daily. 30 tablet 3  . B-D UF III MINI PEN NEEDLES 31G X 5 MM MISC 4 (four) times daily. as directed  11    Home: Home Living Family/patient expects to be discharged to:: Skilled nursing facility Living Arrangements: Spouse/significant other Available Help at Discharge: Family, Available 24  hours/day (wife) Type of Home: House Home Access: Ramped entrance [place]: One level Bathroom Shower/Tub: [place]: Wheelchair - manual, Taylor - quad, Environmental consultant - 2 wheels, Tub bench, Hand held shower head, Grab bars - toilet  Functional History: Prior Function Level of Independence: Independent with assistive device(s) Comments: Was using WC outside home and RW inside home Functional Status:  Mobility: Bed Mobility Overal bed mobility: Needs Assistance Bed Mobility: Supine to Sit Supine to sit: Mod assist, HOB elevated General bed mobility comments: Mod assist w/ trunk posteriorly w/ pt leaning posteriorly. Uses bed rail to pull up to sitting while turning. Transfers Overall transfer level: Needs assistance Equipment used: None Transfers: Government social research officer transfers: Mod assist General transfer comment: Mod A for ant-post transfer assist w/ trunk posteriorly. BLEs supported ant by bed.      ADL: ADL Overall ADL's : Needs assistance/impaired Grooming: Set up Upper Body Bathing: Set up, Bed level Lower Body Bathing: Moderate assistance, Bed level Upper Body Dressing : Minimal assistance, Bed level Upper Body Dressing Details (indicate cue type and reason): loses balance posteriorly at times when unsupported Lower Body Dressing: Moderate assistance, Bed level  Toilet Transfer: Minimal assistance, Anterior/posterior (simulated with recliner) Toileting- Clothing Manipulation and Hygiene: Moderate assistance Toileting - Clothing Manipulation Details (indicate cue type and reason): lateral leans Functional mobility during ADLs: Minimal assistance (ant/post only. would need +2 for sit - stand) General ADL Comments: Increased time spent discussing home layout and discharge plan. Pt is at high risk for falls and safest transfer technique is ant/posterior transfer   Cognition: Cognition Overall Cognitive Status: Within Functional  Limits for tasks assessed Orientation Level: Oriented X4 Cognition Arousal/Alertness: Awake/alert Behavior During Therapy: Flat affect Overall Cognitive Status: Within Functional Limits for tasks assessed   Blood pressure 101/61, pulse 70, temperature 98.5 F (36.9 C), temperature source Oral, resp. rate 16, height 6' (1.829 m), weight 111.273 kg (245 lb 5 oz), SpO2 96 %. Physical Exam  Nursing note and vitals reviewed. Constitutional: He is oriented to person, place, and time. He appears well-developed and well-nourished.  Up in chair--fatigued appearing with reports of nausea.  HENT:  Head: Normocephalic and atraumatic.  Eyes: Conjunctivae are normal. Pupils are equal, round, and reactive to light.  Neck: Normal range of motion. Neck supple.  Cardiovascular: Normal rate and regular rhythm.  Respiratory: Effort normal and breath sounds normal. No respiratory distress. He has no wheezes.  GI: Soft. Bowel sounds are normal. He exhibits no distension. There is no tenderness.  Musculoskeletal:  L-AKA well healed. R-BKA with KI in place.  Neurological: He is alert and oriented to person, place, and time.  Speech clear. Pleasant and appropriate. Able to follows commands without difficulty.  Skin: Skin is warm and dry. No rash noted. No erythema.     Lab Results Last 24 Hours    Results for orders placed or performed during the hospital encounter of 05/10/15 (from the past 24 hour(s))  Glucose, capillary Status: Abnormal   Collection Time: 05/11/15 12:13 PM  Result Value Ref Range   Glucose-Capillary 168 (H) 65 - 99 mg/dL  Glucose, capillary Status: Abnormal   Collection Time: 05/11/15 4:23 PM  Result Value Ref Range   Glucose-Capillary 119 (H) 65 - 99 mg/dL   Comment 1 Notify RN   Glucose, capillary Status: Abnormal   Collection Time: 05/11/15 9:49 PM  Result Value Ref Range   Glucose-Capillary 238 (H) 65 - 99 mg/dL  Basic  metabolic panel Status: Abnormal   Collection Time: 05/12/15 4:20 AM  Result Value Ref Range   Sodium 134 (L) 135 - 145 mmol/L   Potassium 4.0 3.5 - 5.1 mmol/L   Chloride 101 101 - 111 mmol/L   CO2 26 22 - 32 mmol/L   Glucose, Bld 170 (H) 65 - 99 mg/dL   BUN 21 (H) 6 - 20 mg/dL   Creatinine, Ser 1.75 (H) 0.61 - 1.24 mg/dL   Calcium 7.9 (L) 8.9 - 10.3 mg/dL   GFR calc non Af Amer 40 (L) >60 mL/min   GFR calc Af Amer 46 (L) >60 mL/min   Anion gap 7 5 - 15  Glucose, capillary Status: Abnormal   Collection Time: 05/12/15 6:48 AM  Result Value Ref Range   Glucose-Capillary 152 (H) 65 - 99 mg/dL  Glucose, capillary Status: Abnormal   Collection Time: 05/12/15 11:26 AM  Result Value Ref Range   Glucose-Capillary 128 (H) 65 - 99 mg/dL   Comment 1 Notify RN       Imaging Results (Last 48 hours)    No results found.    Assessment/Plan: Diagnosis: s/p right BKA (hx of left AKA) 1. Does  the need for close, 24 hr/day medical supervision in concert with the patient's rehab needs make it unreasonable for this patient to be served in a less intensive setting? Yes 2. Co-Morbidities requiring supervision/potential complications: dm, ckd, hypotension 3. Due to bladder management, bowel management, safety, skin/wound care, disease management, medication administration, pain management and patient education, does the patient require 24 hr/day rehab nursing? Yes 4. Does the patient require coordinated care of a physician, rehab nurse, PT (1-2 hrs/day, 5 days/week) and OT (1-2 hrs/day, 5 days/week) to address physical and functional deficits in the context of the above medical diagnosis(es)? Yes Addressing deficits in the following areas: balance, endurance, locomotion, strength, transferring, bowel/bladder control, bathing, dressing, feeding, grooming, toileting and psychosocial support 5. Can the patient actively  participate in an intensive therapy program of at least 3 hrs of therapy per day at least 5 days per week? Yes 6. The potential for patient to make measurable gains while on inpatient rehab is excellent 7. Anticipated functional outcomes upon discharge from inpatient rehab are modified independent with PT, modified independent with OT, n/a with SLP. 8. Estimated rehab length of stay to reach the above functional goals is: 7-10 days 9. Does the patient have adequate social supports and living environment to accommodate these discharge functional goals? Yes 10. Anticipated D/C setting: Home 11. Anticipated post D/C treatments: Gillespie therapy 12. Overall Rehab/Functional Prognosis: excellent  RECOMMENDATIONS: This patient's condition is appropriate for continued rehabilitative care in the following setting: CIR Patient has agreed to participate in recommended program. Yes Note that insurance prior authorization may be required for reimbursement for recommended care.  Comment: Rehab Admissions Coordinator to follow up.  Thanks,  Meredith Staggers, MD, [facility]     05/12/2015       Revision History     Date/Time User Provider Type Action   05/12/2015 4:47 PM Meredith Staggers, MD Physician Sign   05/12/2015 12:38 PM Bary Leriche, PA-C Physician Assistant Pend   View Details Report       Routing History     Date/Time From To Method   05/12/2015 4:47 PM Meredith Staggers, MD Meredith Staggers, MD In Basket   05/12/2015 4:47 PM Meredith Staggers, MD Chipper Herb, MD In Basket

## 2015-05-14 NOTE — Progress Notes (Signed)
Report called to [facility] RN. Patient has denied pain prior to transfer. Wife and patient informed [facility] is ready to receive and that we will be taking him over soon.

## 2015-05-14 NOTE — Progress Notes (Signed)
Occupational Therapy Treatment Patient Details Name: [name] MRN: [id number] DOB: 17-Dec-1950 Today's Date: 05/14/2015    History of present illness Pt is a 64 y/o M s/p R BKA w/ previous L AKA. Pt's PMH includes HTN, MI, DM II.   OT comments  Making excellent progress. Focus of session on UB strengthening. Contacted "[facility]" regarding pt's desire to visit with couble amputee survivor. Continue to recommend CIR. Pt extremely motivated to return to PLOF.  Follow Up Recommendations  CIR;Supervision/Assistance - 24 hour    Equipment Recommendations  Other (comment);3 in 1 bedside comode    Recommendations for Other Services Rehab consult    Precautions / Restrictions Precautions Precautions: Fall Precaution Comments: L AKA Required Braces or Orthoses: Knee Immobilizer - Right Other Brace/Splint: L prosthesis for mobility Restrictions RLE Weight Bearing: Non weight bearing       Mobility Bed Mobility    Balance     Sitting balance-Leahy Scale: Fair        encouraged reaching out of BOS                     ADL                                         General ADL Comments: encouraged desensitization activities. contacted [facility] and gave name for [facility] support group. Rehab coordinator entered during session and notified pt of admission to CIR. Pt very excited. Pt visiting with friend who has amputation. Pt states he would like to talk with a pt who has a double amputation. Pt expressing his belief in God and His healing powers. Excellent attitude toward rehab.                                       Cognition   Behavior During Therapy: WFL for tasks assessed/performed Overall Cognitive Status: Within Functional Limits for tasks assessed                       Extremity/Trunk Assessment               Exercises Other Exercises Other Exercises: level 2 theraband concentrating on elbow flexion/ext and upper back and  shoulder strengthening. 2 set 15 reps. core strengthening for trunk. Other Exercises: lateral weight shifts in sitting in preparation for LB ADL   Shoulder Instructions       General Comments      Pertinent Vitals/ Pain       Pain Assessment: 0-10 Pain Score: 3  Pain Location: RLE Pain Descriptors / Indicators: Aching;Discomfort Pain Intervention(s): Limited activity within patient's tolerance  Home Living                                          Prior Functioning/Environment              Frequency Min 3X/week     Progress Toward Goals  OT Goals(current goals can now be found in the care plan section)  Progress towards OT goals: Progressing toward goals  Acute Rehab OT Goals Patient Stated Goal: to feel better OT Goal Formulation: With patient/family Time For Goal Achievement: 05/26/15 Potential to Achieve Goals:  Good ADL Goals Pt Will Perform Lower Body Bathing: with set-up;sitting/lateral leans Pt Will Perform Lower Body Dressing: with set-up;sitting/lateral leans Pt Will Transfer to Toilet: with supervision;squat pivot transfer;bedside commode Pt Will Perform Toileting - Clothing Manipulation and hygiene: with modified independence;sitting/lateral leans Pt Will Perform Tub/Shower Transfer: with min guard assist;tub bench;Squat pivot transfer Pt/caregiver will Perform Home Exercise Program: Both right and left upper extremity;Increased strength;With written HEP provided;With theraband  Plan Discharge plan remains appropriate    Co-evaluation                 End of Session     Activity Tolerance Patient tolerated treatment well   Patient Left in chair;with call bell/phone within reach;with family/visitor present   Nurse Communication Mobility status        Time: 1332-1350 OT Time Calculation (min): 18 min  Charges: OT General Charges $OT Visit: 1 Procedure OT Treatments $Therapeutic Exercise: 8-22  mins  Gohan Collister,HILLARY 05/14/2015, 2:07 PM   [facility], OTR/L  [phone] 05/14/2015

## 2015-05-14 NOTE — Progress Notes (Signed)
Pt states that he occasionally wear CPAP at home.  Pt states that he will try CPAP but wants to wait to start it tomorrow night.  Pt to notify RT if he wants sooner.  RT to monitor and assess as needed.

## 2015-05-14 NOTE — PMR Pre-admission (Signed)
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 Date: 10/30/14     Deduct: $400      Out of Pocket Max: $600      Life Max: none CIR: 70%      SNF: 70% Outpatient: $5 per visit     Co-Pay: 30 visits combined Home Health: 70%      Co-Pay: 30% DME: 70%     Co-Pay: 30% Providers: in network  SECONDARY: none      Medicaid Application Date:       Case Manager:  Disability Application Date:       Case Worker:   Emergency Facilities manager Information    Name Relation Home Work Mobile   Viti,Sharon Spouse [phone]  Candelaria Arenas Daughter   [phone]     Current Medical History  Patient Admitting Diagnosis: s/p r bka, h/o l aka  History of Present Illness: [name] is a 64 y.o. 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 He was admitted on 05/10/15 for R-BKA by Dr. Lorin Mercy. post op limited by pain control issues as well as L-AKA. Patient was independent and working PTA with use of left prosthesis and cane.   Past Medical History  Past Medical History  Diagnosis Date  . Hypertension   . Hyperlipidemia   . Necrosis     #2 nail   . Myocardial infarction     "mild" heart attack  . Poor circulation of extremity   . Sleep apnea     uses cpap, getting a new one  . Type 2 Diabetes mellitus     Type 2  . Arthritis   . Pneumonia     as a child  . CKD (chronic kidney disease)  06/28/2014    Sees Dr Florene Glen    Family History  family history includes Cancer in his father and sister; Diabetes in his mother; Hypertension in his mother; Sickle cell anemia in his daughter.  Prior Rehab/Hospitalizations:  Has the patient had major surgery during 100 days prior to admission? No  CIR with Dr. Naaman Plummer 2004  Current Medications   Current facility-administered medications:  .  0.45 % NaCl with KCl 20 mEq / L infusion, , Intravenous, Continuous, Lanae Crumbly, PA-C, Stopped at 05/12/15 1035 .  0.9 %  sodium chloride infusion, , Intravenous, Continuous, Marybelle Killings, MD, Last Rate: 10 mL/hr at 05/10/15 1406 .  atenolol (TENORMIN) tablet 25 mg, 25 mg, Oral, Daily, Lanae Crumbly, PA-C, 25 mg at 05/14/15 1118 .  bisacodyl (DULCOLAX) suppository 10 mg, 10 mg, Rectal, Daily PRN, Lanae Crumbly, PA-C .  cilostazol (PLETAL) tablet  100 mg, 100 mg, Oral, BID, Lanae Crumbly, PA-C, 100 mg at 05/14/15 1116 .  clopidogrel (PLAVIX) tablet 75 mg, 75 mg, Oral, Daily, Lanae Crumbly, PA-C, 75 mg at 05/14/15 1117 .  docusate sodium (COLACE) capsule 100 mg, 100 mg, Oral, BID, Lanae Crumbly, PA-C, 100 mg at 05/14/15 1118 .  furosemide (LASIX) tablet 40 mg, 40 mg, Oral, Daily, Lanae Crumbly, PA-C, 40 mg at 05/14/15 1117 .  HYDROmorphone (DILAUDID) injection 1 mg, 1 mg, Intravenous, Q3H PRN, Lanae Crumbly, PA-C, 1 mg at 05/12/15 0408 .  insulin aspart (novoLOG) injection 8-12 Units, 8-12 Units, Subcutaneous, TID WC, Marybelle Killings, MD, 8 Units at 05/13/15 1648 .  insulin glargine (LANTUS) injection 34 Units, 34 Units, Subcutaneous, QHS, Marybelle Killings, MD, 34 Units at 05/13/15 2158 .  lactated ringers infusion, , Intravenous, Continuous, Kate Sable, MD .  methocarbamol (ROBAXIN) tablet 750 mg, 750 mg, Oral, Q6H PRN, 750 mg at 05/14/15 1118 **OR** methocarbamol (ROBAXIN) 500 mg in dextrose 5 % 50 mL IVPB, 500 mg, Intravenous, Q6H PRN, Lanae Crumbly, PA-C .  metoCLOPramide (REGLAN) tablet 5-10 mg, 5-10  mg, Oral, Q8H PRN **OR** metoCLOPramide (REGLAN) injection 5-10 mg, 5-10 mg, Intravenous, Q8H PRN, Lanae Crumbly, PA-C .  ondansetron [facility]) tablet 4 mg, 4 mg, Oral, Q6H PRN **OR** ondansetron (ZOFRAN) injection 4 mg, 4 mg, Intravenous, Q6H PRN, Lanae Crumbly, PA-C .  oxyCODONE-acetaminophen (PERCOCET/ROXICET) 5-325 MG per tablet 1-2 tablet, 1-2 tablet, Oral, Q4H PRN, 2 tablet at 05/14/15 1118 **AND** oxyCODONE (Oxy IR/ROXICODONE) immediate release tablet 2.5-5 mg, 2.5-5 mg, Oral, Q4H PRN, Marybelle Killings, MD, 5 mg at 05/12/15 1143 .  polyethylene glycol (MIRALAX / GLYCOLAX) packet 17 g, 17 g, Oral, Daily, Marybelle Killings, MD, 17 g at 05/13/15 0949 .  rosuvastatin (CRESTOR) tablet 20 mg, 20 mg, Oral, Daily, Lanae Crumbly, PA-C, 20 mg at 05/13/15 [id number]  Patients Current Diet: Diet Carb Modified Fluid consistency:: Thin; Room service appropriate?: Yes  Precautions / Restrictions Precautions Precautions: Fall Precaution Comments: L AKA Other Brace/Splint: L prosthesis for mobility Restrictions Weight Bearing Restrictions: No RLE Weight Bearing: Non weight bearing   Has the patient had 2 or more falls or a fall with injury in the past year?No  Prior Activity Level Community (5-7x/wk): works fulltime as a Regulatory affairs officer. Drives. Mod I with cane and prosthesis  Home Assistive Devices / Equipment Home Assistive Devices/Equipment: Cane (specify quad or straight), Environmental consultant (specify type), Wheelchair, CBG Meter, Crutches Home Equipment: Wheelchair - manual, Cane - quad, Environmental consultant - 2 wheels, Tub bench, Hand held shower head, Grab bars - toilet  Prior Device Use: Indicate devices/aids used by the patient prior to current illness, exacerbation or injury? cane and LE prosthesis  Prior Functional Level Prior Function Level of Independence: Independent with assistive device(s) Comments: only recentlu used wc in community and RW at home. Was before using only cane and aka prosthesis  Self Care: Did the  patient need help bathing, dressing, using the toilet or eating?  Independent  Indoor Mobility: Did the patient need assistance with walking from room to room (with or without device)? Independent  Stairs: Did the patient need assistance with internal or external stairs (with or without device)? Independent  Functional Cognition: Did the patient need help planning regular tasks such as shopping or remembering to take medications? Needed some help  Current Functional Level Cognition  Overall Cognitive Status: Within Functional Limits for tasks assessed Orientation  Level: Oriented X4    Extremity Assessment (includes Sensation/Coordination)  Upper Extremity Assessment: Generalized weakness  Lower Extremity Assessment: RLE deficits/detail, LLE deficits/detail RLE Deficits / Details: s/p R BKA LLE Deficits / Details: previous L AKA    ADLs  Overall ADL's : Needs assistance/impaired Grooming: Set up Upper Body Bathing: Set up, Bed level Lower Body Bathing: Moderate assistance, Bed level Upper Body Dressing : Minimal assistance, Bed level Upper Body Dressing Details (indicate cue type and reason): loses balance posteriorly at times when unsupported Lower Body Dressing: Moderate assistance, Bed level Toilet Transfer: Minimal assistance, Anterior/posterior (simulated with recliner) Toileting- Clothing Manipulation and Hygiene: Moderate assistance Toileting - Clothing Manipulation Details (indicate cue type and reason): lateral leans Functional mobility during ADLs: Minimal assistance (ant/post only. would need +2 for sit - stand) General ADL Comments: encouraged desensitization activities. contacted [facility] and gave name for AHA support group. Rehab coordinator entered during session and notified pt of admission to CIR. Pt very excited. Pt visiting with friend who has amputation. Pt states he would like to talk with a pt who has a double amputation. Pt expressing his belief in God and  His healing powers. Excellent attitude toward rehab.     Mobility  Overal bed mobility: Needs Assistance Bed Mobility: Sit to Supine, Supine to Sit Supine to sit: Min assist Sit to supine: Min assist General bed mobility comments: Min assist at times as pt has tendency to lean posteriorly 2/2 impaired balance.  Use of bed rails, increased time.    Transfers  Overall transfer level: Needs assistance Equipment used: None, 2 person hand held assist Transfers: Anterior-Posterior Transfer Sit to Stand: +2 physical assistance, Max assist, +2 safety/equipment Anterior-Posterior transfers: Min assist General transfer comment: Patient able to stand this session with +2 assist for 30 sec with assist to maintain balance and positioning of RW. A for powering up and for placement of L foot. Patient unable to stand pivot transfer due to fit of prothesis this session but was able to sit back down and AP transfer into the recliner with cues for postiioning.     Ambulation / Gait / Stairs / Office manager / Balance Balance Overall balance assessment: Needs assistance Sitting-balance support: Bilateral upper extremity supported Sitting balance-Leahy Scale: Fair Postural control: Posterior lean Standing balance support:  (not assessed due to needing +2) Standing balance-Leahy Scale: Poor    Special needs/care consideration Skin operative site Bowel mgmt: continent LBM 7/13 Bladder mgmt: continent Diabetic mgmt yes   Previous Home Environment Living Arrangements: Spouse/significant other  Lives With: Spouse Available Help at Discharge: Family, Available 24 hours/day Type of Home: House Home Layout: One level Home Access: Ramped entrance Bathroom Shower/Tub: Walk-in shower, Other (comment) (w/c made that can go directly in shower) Bathroom Toilet: Handicapped height Bathroom Accessibility: Yes How Accessible: Accessible via wheelchair, Accessible via walker [place]: Yes Type of [place]: [place] (if known): advanced care Additional Comments: 2004 aka pt then had voc rehab and made home completely handicapped accessible. Also saw Robin in OP for prosthetic training  Discharge Living Setting Plans for Discharge Living Setting: Patient's home, Lives with (comment), Other (Comment) (wife) Type of Home at Discharge: House Discharge Home Layout: One level Discharge Home Access: [place] entrance Discharge Bathroom Shower/Tub: Walk-in shower, Other (comment) (wc accessible walk in shower) Discharge Bathroom Toilet: Handicapped height Discharge Bathroom Accessibility: Yes How Accessible: Accessible via wheelchair, Accessible via walker Does  the patient have any problems obtaining your medications?: No  Social/Family/Support Systems Patient Roles: Spouse, Parent, Other (Comment) (pastor of church fulltime) Contact Information: Claudette Head, wife Anticipated Caregiver: wife and two daughters Anticipated Caregiver's Contact Information: see above Ability/Limitations of Caregiver: wife retired and can provide min assist level Caregiver Availability: 24/7 Discharge Plan Discussed with Primary Caregiver: Yes Is Caregiver In Agreement with Plan?: Yes Does Caregiver/Family have Issues with Lodging/Transportation while Pt is in Rehab?: No (wife stays in hospital with pt)  Goals/Additional Needs Patient/Family Goal for Rehab: Mod I PT and OT w/c level Expected length of stay: [facility] 7-10 days Special Service Needs: 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 I have directed them to visit 4 pm up until 8 pm Pt/Family Agrees to Admission and willing to participate: Yes Program Orientation Provided & Reviewed with Pt/Caregiver Including Roles  & Responsibilities: Yes  Decrease burden of Care through IP rehab admission: n/a  Possible need for SNF placement upon discharge:not anticipated. Wife reports she will take him home as  he is.  Patient Condition: This patient's condition remains as documented in the consult dated 05/12/2015, in which the Rehabilitation Physician determined and documented that the patient's condition is appropriate for intensive rehabilitative care in an inpatient rehabilitation facility. Will admit to inpatient rehab today.  Preadmission Screen Completed By:  Cleatrice Burke, 05/14/2015 3:18 PM ______________________________________________________________________   Discussed status with Dr. Naaman Plummer on 05/14/2015 at  1518 and received telephone approval for admission today.  Admission Coordinator:  Cleatrice Burke, time [id number] Date 05/14/2015.

## 2015-05-14 NOTE — H&P (View-Only) (Signed)
Physical Medicine and Rehabilitation Admission H&P    CC:  R-BKA due to PAD and poorly healing wound. H/o L-AKA.   HPI: [name] is a 64 y.o. 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 He was admitted on 05/10/15 for R-BKA by Dr. Lorin Mercy. Post op pain improving but continues to have some phantom pain. Noted to have leucocytosis and ABLA is stable. Therapy ongoing and CIR was recommended for follow up therapy.    Review of Systems  Constitutional: Negative for fever.  HENT: Negative for hearing loss.   Eyes: Negative for blurred vision and double vision.  Respiratory: Negative for cough and shortness of breath.   Cardiovascular: Negative for chest pain and palpitations.  Gastrointestinal: Negative for heartburn, nausea, abdominal pain and constipation.  Genitourinary: Negative for dysuria and urgency.  Musculoskeletal: Positive for falls. Negative for myalgias.  Neurological: Positive for sensory change. Negative for dizziness, tingling and headaches.  Psychiatric/Behavioral: Negative for depression. The patient does not have insomnia.       Past Medical History  Diagnosis Date  . Hypertension   . Hyperlipidemia   . Necrosis     #2 nail   . Myocardial infarction     "mild" heart attack  . Poor circulation of extremity   . Sleep apnea     uses cpap, getting a new one  . Type 2 Diabetes mellitus     Type 2  . Arthritis   . Pneumonia     as a child  . CKD (chronic kidney disease) 06/28/2014    Sees Dr Florene Glen    Past Surgical History  Procedure Laterality Date  . Above knee leg amputation Left 2004  . Cataract extraction w/phaco  09/05/2012    Procedure: CATARACT EXTRACTION PHACO AND INTRAOCULAR LENS PLACEMENT (IOC);  Surgeon: Tonny Branch, MD;  Location: [facility];  Service: Ophthalmology;  Laterality: Left;  CDE=5.45  . Cataract extraction w/phaco  10/03/2012   Procedure: CATARACT EXTRACTION PHACO AND INTRAOCULAR LENS PLACEMENT (IOC);  Surgeon: Tonny Branch, MD;  Location: [facility];  Service: Ophthalmology;  Laterality: Right;  CDE: 12.31  . Colonoscopy    . Amputation Right 04/28/2015    Procedure: AMPUTATION RAY, RIGHT 5TH TOE;  Surgeon: Marybelle Killings, MD;  Location: [place];  Service: Orthopedics;  Laterality: Right;  . Amputation Right 05/10/2015    Procedure: Right Below Knee Amputation;  Surgeon: Marybelle Killings, MD;  Location: [place];  Service: Orthopedics;  Laterality: Right;    Family History  Problem Relation Age of Onset  . Diabetes Mother   . Hypertension Mother   . Cancer Father   . Cancer Sister   . Sickle cell anemia Daughter     Social History:  Married. Independent. 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 Wife retired and can provide assistance past discharge. He reports that he has never smoked. He has never used smokeless tobacco. He reports that he does not drink alcohol or use illicit drugs.    Allergies  Allergen Reactions  . Zolpidem Tartrate Other (See Comments)    disorientation     Facility-administered medications prior to admission  Medication Dose Route Frequency Provider Last Rate Last Dose  . methylPREDNISolone acetate (DEPO-MEDROL) injection 40 mg  40 mg Intramuscular Once Sharion Balloon, FNP   40 mg at 05/04/15 1130   Medications Prior to Admission  Medication Sig Dispense Refill  . acetaminophen (TYLENOL) 500 MG tablet Take 500 mg by mouth every 6 (six) hours as needed (pain).     Marland Kitchen amoxicillin-clavulanate (AUGMENTIN) 875-125 MG per tablet Take 1 tablet by mouth 2 (two) times daily. Restarted 05/06/15 (approx 4 weeks left of 6 week course)    . atenolol (TENORMIN) 25 MG tablet Take 1 tablet (25 mg total) by mouth daily. 30 tablet 3  . benzonatate (TESSALON) 200 MG capsule Take 1 capsule (200 mg total) by mouth 3 (three) times daily as needed. (Patient taking differently: Take 200 mg by mouth 3 (three) times daily  as needed for cough. ) 30 capsule 1  . Calcium Carb-Cholecalciferol (CALCIUM PLUS VITAMIN D3) 600-500 MG-UNIT CAPS Take 1 capsule by mouth daily.     . cilostazol (PLETAL) 100 MG tablet Take 1 tablet (100 mg total) by mouth 2 (two) times daily. 60 tablet 3  . clopidogrel (PLAVIX) 75 MG tablet Take 1 tablet (75 mg total) by mouth daily. 30 tablet 3  . Dextromethorphan-Guaifenesin ([employer] CHEST CONGESTION RELIEF DM) 20-400 MG TABS Take 1 tablet by mouth every 4 (four) hours as needed (cough/congestion).    Marland Kitchen exenatide (BYETTA) 10 MCG/0.04ML SOPN injection Inject 10 mcg into the skin 2 (two) times daily with a meal.    . furosemide (LASIX) 40 MG tablet Take 1 tablet (40 mg total) by mouth daily. 30 tablet 3  . HYDROcodone-homatropine (HYCODAN) 5-1.5 MG/5ML syrup Take 5 mLs by mouth every 8 (eight) hours as needed for cough. 120 mL 0  . insulin aspart (NOVOLOG FLEXPEN) 100 UNIT/ML FlexPen Inject 8-10 Units into the skin 3 (three) times daily with meals. CBG <150 8 units, 150-200 9 units, 200-250 10 units    . LANTUS SOLOSTAR 100 UNIT/ML Solostar Pen Inject 34 Units into the skin at bedtime. 15 mL 2  . oxyCODONE-acetaminophen (ROXICET) 5-325 MG per tablet Take 1 tablet by mouth every 6 (six) hours as needed for severe pain. 60 tablet 0  . rosuvastatin (CRESTOR) 20 MG tablet Take 1 tablet (20 mg total) by mouth daily. 30 tablet 3  . B-D UF III MINI PEN NEEDLES 31G X 5 MM MISC 4 (four) times daily. as directed  11    Home: Home Living Family/patient expects to be discharged to:: Private residence Living Arrangements: Spouse/significant other Available Help at Discharge: Family, Available 24 hours/day (wife) Type of Home: House Home Access: Ramped entrance [place]: One level Bathroom Shower/Tub: [place]: Wheelchair - manual, Radio producer - quad, Environmental consultant - 2 wheels, Tub bench, Hand held shower head, Grab bars - toilet   Functional History: Prior Function Level of Independence:  Independent with assistive device(s) Comments: Was using WC outside home and RW inside home  Functional Status:  Mobility: Bed Mobility Overal bed mobility: Needs Assistance Bed Mobility: Sit to Supine, Supine to Sit Supine to sit: HOB elevated, Min assist Sit to supine: Min assist General bed mobility comments: Min assist at times as pt has tendency to lean posteriorly 2/2 impaired balance.  Use of bed rails, increased time. Transfers Overall transfer level: Needs assistance Equipment used: None Transfers: Government social research officer transfers: Min assist General transfer comment: Min A with AP transfers as patient continues with tendency to lean posterior and requires A to correct. Patient with good UE support and ability to scoot himself posteiorly. Patient attemped stand with prothesis on with use of steady but due to size of steady and patietn prothesis patient was unable  to stand fully upright. Patient very motivated to atttempt however.       ADL: ADL Overall ADL's : Needs assistance/impaired Grooming: Set up Upper Body Bathing: Set up, Bed level Lower Body Bathing: Moderate assistance, Bed level Upper Body Dressing : Minimal assistance, Bed level Upper Body Dressing Details (indicate cue type and reason): loses balance posteriorly at times when unsupported Lower Body Dressing: Moderate assistance, Bed level Toilet Transfer: Minimal assistance, Anterior/posterior (simulated with recliner) Toileting- Clothing Manipulation and Hygiene: Moderate assistance Toileting - Clothing Manipulation Details (indicate cue type and reason): lateral leans Functional mobility during ADLs: Minimal assistance (ant/post only. would need +2 for sit - stand) General ADL Comments: Increased time sent discussing home layout and discharge plan. Pt is at high risk for falls and safest transfer technique at this time is ant/posterior transfer. This limits pt at home with functional  mobility. PTA,pt was functioanl ambulator with a cane. Wife expressed concerns over pt D/C home rather than rehab. Discussed options regarding DME and proper set up.   Cognition: Cognition Overall Cognitive Status: Within Functional Limits for tasks assessed Orientation Level: Oriented X4 Cognition Arousal/Alertness: Awake/alert Behavior During Therapy: WFL for tasks assessed/performed Overall Cognitive Status: Within Functional Limits for tasks assessed   Blood pressure 118/64, pulse 78, temperature 98.3 F (36.8 C), temperature source Oral, resp. rate 18, height 6' (1.829 m), weight 111.273 kg (245 lb 5 oz), SpO2 100 %. Physical Exam  Nursing note and vitals reviewed. Constitutional: He is oriented to person, place, and time. He appears well-developed and well-nourished.  HENT:  Head: Normocephalic and atraumatic.  Mouth/Throat: Oropharynx is clear and moist. No oropharyngeal exudate.  Eyes: Conjunctivae are normal. Pupils are equal, round, and reactive to light.  Neck: Normal range of motion. Neck supple. No JVD present. No tracheal deviation present. No thyromegaly present.  Cardiovascular: Normal rate, regular rhythm and normal heart sounds.  Exam reveals no friction rub.   No murmur heard. Respiratory: Effort normal and breath sounds normal. No respiratory distress. He has no wheezes. He has no rales.  GI: Soft. Bowel sounds are normal. He exhibits no distension. There is no tenderness. There is no rebound.  Musculoskeletal: He exhibits tenderness.  R-AKA with dry dressing and moderate edema. Tender to touch.  L-AKA well healed.   Lymphadenopathy:    He has no cervical adenopathy.  Neurological: He is alert and oriented to person, place, and time. He displays normal reflexes. No cranial nerve deficit. He exhibits normal muscle tone. Coordination normal.  Pleasant and appropriate. Speech clear. Able to follow commands without difficulty.  UE 5/5 delt,bicep, tircep, HI. RLE: 2/5  HF, 2/5 KE. LLE: 4/5 HF. Sensation grossly intact.   Skin: Skin is warm and dry.  Psychiatric: He has a normal mood and affect. His behavior is normal. Judgment and thought content normal.    Results for orders placed or performed during the hospital encounter of 05/10/15 (from the past 48 hour(s))  Glucose, capillary     Status: Abnormal   Collection Time: 05/12/15 11:26 AM  Result Value Ref Range   Glucose-Capillary 128 (H) 65 - 99 mg/dL   Comment 1 Notify RN   Glucose, capillary     Status: Abnormal   Collection Time: 05/12/15  4:35 PM  Result Value Ref Range   Glucose-Capillary 136 (H) 65 - 99 mg/dL   Comment 1 Notify RN   Glucose, capillary     Status: Abnormal   Collection Time: 05/12/15  9:41 PM  Result  Value Ref Range   Glucose-Capillary 172 (H) 65 - 99 mg/dL   Comment 1 Notify RN   Basic metabolic panel     Status: Abnormal   Collection Time: 05/13/15  4:33 AM  Result Value Ref Range   Sodium 136 135 - 145 mmol/L   Potassium 4.1 3.5 - 5.1 mmol/L   Chloride 103 101 - 111 mmol/L   CO2 26 22 - 32 mmol/L   Glucose, Bld 152 (H) 65 - 99 mg/dL   BUN 17 6 - 20 mg/dL   Creatinine, Ser 1.74 (H) 0.61 - 1.24 mg/dL   Calcium 8.0 (L) 8.9 - 10.3 mg/dL   GFR calc non Af Amer 40 (L) >60 mL/min   GFR calc Af Amer 47 (L) >60 mL/min    Comment: (NOTE) The eGFR has been calculated using the CKD EPI equation. This calculation has not been validated in all clinical situations. eGFR's persistently <60 mL/min signify possible Chronic Kidney Disease.    Anion gap 7 5 - 15  Glucose, capillary     Status: Abnormal   Collection Time: 05/13/15  6:21 AM  Result Value Ref Range   Glucose-Capillary 135 (H) 65 - 99 mg/dL   Comment 1 Notify RN   Glucose, capillary     Status: Abnormal   Collection Time: 05/13/15  9:20 AM  Result Value Ref Range   Glucose-Capillary 258 (H) 65 - 99 mg/dL  Glucose, capillary     Status: Abnormal   Collection Time: 05/13/15 11:15 AM  Result Value Ref Range    Glucose-Capillary 281 (H) 65 - 99 mg/dL   Comment 1 Notify RN   Glucose, capillary     Status: Abnormal   Collection Time: 05/13/15  4:04 PM  Result Value Ref Range   Glucose-Capillary 141 (H) 65 - 99 mg/dL   Comment 1 Notify RN   Glucose, capillary     Status: Abnormal   Collection Time: 05/13/15 10:09 PM  Result Value Ref Range   Glucose-Capillary 124 (H) 65 - 99 mg/dL  Glucose, capillary     Status: Abnormal   Collection Time: 05/14/15  6:55 AM  Result Value Ref Range   Glucose-Capillary 63 (L) 65 - 99 mg/dL   No results found.     Medical Problem List and Plan: 1. Functional deficits secondary to  s/p right BKA (hx of left AKA) 2.  DVT Prophylaxis/Anticoagulation: initiate Pharmaceutical: Lovenox--pt at risk for VTE given immobility 3. Pain Management: Current regimen of percocet and robaxin effective. Will continue and augment as indicated.  4. Mood: LCSW to follow for evaluation and support. Team to encourage also 5. Neuropsych: This patient is capable of making decisions on his own behalf. 6. Skin/Wound Care: Monitor wound daily. Maintain dressing and ACE wrap to promote edema control and wound healing 7. Fluids/Electrolytes/Nutrition: Monitor I/O. Encourage PO. Check labs on admission 8.  DM type 2: Po intake poor at this time. Recommended supplements from home tid- qid (which he prefers).  Monitor BS with ac/hs checks. Continue lantus insulin and custom SSI.  -adjust insulin as indicated  9. ABLA: Monitor for stability. Recheck in am.  10. HTN: Monitor bid. Continue tenormin and lasix daily. Baseline Cr 1.9-2 range.   -normotensive at present 11. OSA: CPAP at bedtime 12. CKD: follow i's and o's. Push po fluids. Recheck labs in am. 13.  Leukocytosis: no active signs of infection on exam. Check CBC in am.        Post Admission Physician Evaluation:  1. Functional deficits secondary  to s/p right BKA (hx of left AKA) 2. Patient is admitted to receive collaborative,  interdisciplinary care between the physiatrist, rehab nursing staff, and therapy team. 3. Patient's level of medical complexity and substantial therapy needs in context of that medical necessity cannot be provided at a lesser intensity of care such as a SNF. 4. Patient has experienced substantial functional loss from his/her baseline which was documented above under the "Functional History" and "Functional Status" headings.  Judging by the patient's diagnosis, physical exam, and functional history, the patient has potential for functional progress which will result in measurable gains while on inpatient rehab.  These gains will be of substantial and practical use upon discharge  in facilitating mobility and self-care at the household level. 5. Physiatrist will provide 24 hour management of medical needs as well as oversight of the therapy plan/treatment and provide guidance as appropriate regarding the interaction of the two. 6. 24 hour rehab nursing will assist with bladder management, bowel management, safety, skin/wound care, disease management, medication administration, pain management and patient education  and help integrate therapy concepts, techniques,education, etc. 7. PT will assess and treat for/with: Lower extremity strength, range of motion, stamina, balance, functional mobility, safety, adaptive techniques and equipment, NMR, pre-prosthetic education, pain control, coping skills.   Goals are: mod I at w/c level--left AK prosthesis will not help pt with transfers or mobility----may be able to use for short dx ambulation if he's strong enough to power through. 8. OT will assess and treat for/with: ADL's, functional mobility, safety, upper extremity strength, adaptive techniques and equipment, pain mgt, preo-prosthetic education, coping skills.   Goals are: mod I at w/c level. Therapy may not yet proceed with showering this patient. 9. SLP will assess and treat for/with: n/a.  Goals are:  n/a. 10. Case Management and Social Worker will assess and treat for psychological issues and discharge planning. 11. Team conference will be held weekly to assess progress toward goals and to determine barriers to discharge. 12. Patient will receive at least 3 hours of therapy per day at least 5 days per week. 13. ELOS: 7-10 days       14. Prognosis:  excellent     Meredith Staggers, MD, [facility] 05/14/2015   05/14/2015

## 2015-05-14 NOTE — Progress Notes (Signed)
Subjective: Doing well.  Pain controlled.  Wife in rooms.  If he can't go to inpatient rehab then patient will discharge home.    Objective: Vital signs in last 24 hours: Temp:  [98.3 F (36.8 C)-98.5 F (36.9 C)] 98.3 F (36.8 C) (07/14 2102) Pulse Rate:  [77-79] 78 (07/14 2102) Resp:  [18] 18 (07/14 2102) BP: (108-127)/(64-76) 118/64 mmHg (07/14 2102) SpO2:  [100 %] 100 % (07/14 2102)  Intake/Output from previous day: 07/14 0701 - 07/15 0700 In: 720 [P.O.:720] Out: -  Intake/Output this shift:    No results for input(s): HGB in the last 72 hours. No results for input(s): WBC, RBC, HCT, PLT in the last 72 hours.  Recent Labs  05/12/15 0420 05/13/15 0433  NA 134* 136  K 4.0 4.1  CL 101 103  CO2 26 26  BUN 21* 17  CREATININE 1.75* 1.74*  GLUCOSE 170* 152*  CALCIUM 7.9* 8.0*   No results for input(s): LABPT, INR in the last 72 hours.  Exam:  Alert and oriented.  Dressing c/d/i.    Assessment/Plan: Transfer to inpatient rehab today if approved but will discharge home tomorrow if he's not.  Wife states that she would like a day or so to get hospital bed for home if needed.     Jazira Maloney M 05/14/2015, 7:35 AM

## 2015-05-15 ENCOUNTER — Inpatient Hospital Stay (HOSPITAL_COMMUNITY): Payer: BLUE CROSS/BLUE SHIELD | Admitting: Occupational Therapy

## 2015-05-15 ENCOUNTER — Inpatient Hospital Stay (HOSPITAL_COMMUNITY): Payer: BLUE CROSS/BLUE SHIELD | Admitting: Physical Therapy

## 2015-05-15 DIAGNOSIS — Z89511 Acquired absence of right leg below knee: Secondary | ICD-10-CM

## 2015-05-15 LAB — CBC WITH DIFFERENTIAL/PLATELET
BASOS PCT: 0 % (ref 0–1)
Basophils Absolute: 0 10*3/uL (ref 0.0–0.1)
Eosinophils Absolute: 0.2 10*3/uL (ref 0.0–0.7)
Eosinophils Relative: 2 % (ref 0–5)
HCT: 27 % — ABNORMAL LOW (ref 39.0–52.0)
Hemoglobin: 9 g/dL — ABNORMAL LOW (ref 13.0–17.0)
LYMPHS ABS: 2.9 10*3/uL (ref 0.7–4.0)
LYMPHS PCT: 27 % (ref 12–46)
MCH: 29.2 pg (ref 26.0–34.0)
MCHC: 33.3 g/dL (ref 30.0–36.0)
MCV: 87.7 fL (ref 78.0–100.0)
MONOS PCT: 6 % (ref 3–12)
Monocytes Absolute: 0.6 10*3/uL (ref 0.1–1.0)
Neutro Abs: 6.9 10*3/uL (ref 1.7–7.7)
Neutrophils Relative %: 65 % (ref 43–77)
Platelets: 284 10*3/uL (ref 150–400)
RBC: 3.08 MIL/uL — AB (ref 4.22–5.81)
RDW: 13.8 % (ref 11.5–15.5)
WBC: 10.6 10*3/uL — ABNORMAL HIGH (ref 4.0–10.5)

## 2015-05-15 LAB — GLUCOSE, CAPILLARY
GLUCOSE-CAPILLARY: 155 mg/dL — AB (ref 65–99)
GLUCOSE-CAPILLARY: 144 mg/dL — AB (ref 65–99)
Glucose-Capillary: 145 mg/dL — ABNORMAL HIGH (ref 65–99)
Glucose-Capillary: 133 mg/dL — ABNORMAL HIGH (ref 65–99)
Glucose-Capillary: 154 mg/dL — ABNORMAL HIGH (ref 65–99)

## 2015-05-15 LAB — COMPREHENSIVE METABOLIC PANEL
ALK PHOS: 70 U/L (ref 38–126)
ALT: 26 U/L (ref 17–63)
ANION GAP: 8 (ref 5–15)
AST: 49 U/L — ABNORMAL HIGH (ref 15–41)
Albumin: 1.9 g/dL — ABNORMAL LOW (ref 3.5–5.0)
BUN: 21 mg/dL — AB (ref 6–20)
CALCIUM: 8.3 mg/dL — AB (ref 8.9–10.3)
CO2: 25 mmol/L (ref 22–32)
Chloride: 106 mmol/L (ref 101–111)
Creatinine, Ser: 1.49 mg/dL — ABNORMAL HIGH (ref 0.61–1.24)
GFR, EST AFRICAN AMERICAN: 56 mL/min — AB (ref >60)
GFR, EST NON AFRICAN AMERICAN: 49 mL/min — AB (ref >60)
GLUCOSE: 130 mg/dL — AB (ref 65–99)
POTASSIUM: 4.3 mmol/L (ref 3.5–5.1)
Sodium: 139 mmol/L (ref 135–145)
TOTAL PROTEIN: 6.8 g/dL (ref 6.5–8.1)
Total Bilirubin: 0.2 mg/dL — ABNORMAL LOW (ref 0.3–1.2)

## 2015-05-15 NOTE — Progress Notes (Addendum)
Occupational Therapy Session Note  Patient Details  Name: [name] MRN: [id number] Date of Birth: 12-09-1950  Today's Date: 05/15/2015 OT Individual Time: 1330-1430 OT Individual Time Calculation (min): 60 min    Short Term Goals: Week 1:  OT Short Term Goal 1 (Week 1): Patient will complete toilet transfers with minimum assistance. OT Short Term Goal 2 (Week 1): Patient will complete LB dressing with moderate assistance. OT Short Term Goal 3 (Week 1): Patient will complete UB dressing with modified independence. OT Short Term Goal 4 (Week 1): Patient will complete shower transfers with minimum assistance. OT Short Term Goal 5 (Week 1): Patient will complete grooming independently at the sink.  Skilled Therapeutic Interventions/Progress Updates:  Balance/vestibular training;Patient/family education;Self Care/advanced ADL retraining;Therapeutic Exercise;Wheelchair propulsion/positioning;Discharge planning;DME/adaptive equipment instruction   Patient seen this afternoon for functional mobility and ADL retraining using A/P scoot for bed to/from DABSC positioned flush to bedside.  Transfers with min assist with max verbal cues for technique.  Patient educated on side to side leaning for clothing management down/up.  Able to complete down/up with min assist for clothing management up in back (10%).  Max verbal cues for upright posture vs posterior lean; educated on fall risk with posterior lean.  Patient does not complete toileting (urination or bowel movement) today.  Patient completes bed <> wheelchair transfers A/P scoot with min assist for stabilization of chair and verbal cues for safety/technique.  Completes BUE therapeutic activity tolerance and strengthening tasks to self propel wheelchair from hospital blue [id number] weighted ball for the following exercises: bicep curls, chest press, shoulder press, shoulder flexion, and horizontal abduction/adduction, and forward/backward rowing, all 3 sets x  10 reps.  Moderate rest breaks required.  Patient remains in wheelchair in hospital room at end of session; wife present.  All needs/call bell in place.  Educated patient to call RN to transfer back to hospital bed.  Therapy Documentation Precautions:  Precautions Precautions: Fall Precaution Comments: L AKA, R BKA  Required Braces or Orthoses: Knee Immobilizer - Right Other Brace/Splint: L prosthesis for mobility Restrictions Weight Bearing Restrictions: Yes RLE Weight Bearing: Non weight bearing General: General Chart Reviewed: Yes Family/Caregiver Present: Yes Vital Signs: Therapy Vitals Pulse Rate: 80 BP: 127/73 mmHg Patient Position (if appropriate): Sitting Oxygen Therapy SpO2: 100 % O2 Device: Not Delivered Pain: Pain Assessment Pain Assessment: 0-10 Pain Score: 3  Pain Type: Surgical pain Pain Location: Leg Pain Orientation: Right  See FIM for current functional status  Therapy/Group: Individual Therapy  Osa Craver 05/15/2015, 2:05 PM

## 2015-05-15 NOTE — Evaluation (Signed)
Occupational Therapy Assessment and Plan  Patient Details  Name: [name] MRN: [id number] Date of Birth: September 26, 1951  OT Diagnosis: muscle weakness (generalized), swelling of limb and new R BKA and history of L AKA Rehab Potential: Rehab Potential (ACUTE ONLY): Good ELOS: 12-14 days   Today's Date: 05/15/2015 OT Individual Time: 1100-1200 OT Individual Time Calculation (min): 60 min     Problem List:  Patient Active Problem List   Diagnosis Date Noted  . Status post below knee amputation of right lower extremity 05/14/2015  . History of left above knee amputation 05/14/2015  . Toe amputation status 04/28/2015  . Diabetic foot infection 04/12/2015  . Pressure ulcer 04/12/2015  . OSA (obstructive sleep apnea) 02/23/2015  . Acute confusional state 08/04/2014  . Type 2 diabetes mellitus with other circulatory complications 30/06/2329  . HLD (hyperlipidemia) 08/04/2014  . CKD (chronic kidney disease) 06/28/2014  . Transient global amnesia 06/27/2014  . TIA (transient ischemic attack) 06/27/2014  . Hypotension 11/08/2013  . OBESITY, UNSPECIFIED 08/17/2010  . SYNCOPE 08/17/2010  . CARDIOVASCULAR FUNCTION STUDY, ABNORMAL 08/17/2010  . DM 08/11/2010  . HYPERLIPIDEMIA 08/11/2010  . Essential hypertension 08/11/2010    Past Medical History:  Past Medical History  Diagnosis Date  . Hypertension   . Hyperlipidemia   . Necrosis     #2 nail   . Myocardial infarction     "mild" heart attack  . Poor circulation of extremity   . Sleep apnea     uses cpap, getting a new one  . Type 2 Diabetes mellitus     Type 2  . Arthritis   . Pneumonia     as a child  . CKD (chronic kidney disease) 06/28/2014    Sees Dr Florene Glen   Past Surgical History:  Past Surgical History  Procedure Laterality Date  . Above knee leg amputation Left 2004  . Cataract extraction w/phaco  09/05/2012    Procedure: CATARACT EXTRACTION PHACO AND INTRAOCULAR LENS PLACEMENT (IOC);  Surgeon: Tonny Branch, MD;   Location: [facility];  Service: Ophthalmology;  Laterality: Left;  CDE=5.45  . Cataract extraction w/phaco  10/03/2012    Procedure: CATARACT EXTRACTION PHACO AND INTRAOCULAR LENS PLACEMENT (IOC);  Surgeon: Tonny Branch, MD;  Location: [facility];  Service: Ophthalmology;  Laterality: Right;  CDE: 12.31  . Colonoscopy    . Amputation Right 04/28/2015    Procedure: AMPUTATION RAY, RIGHT 5TH TOE;  Surgeon: Marybelle Killings, MD;  Location: [place];  Service: Orthopedics;  Laterality: Right;  . Amputation Right 05/10/2015    Procedure: Right Below Knee Amputation;  Surgeon: Marybelle Killings, MD;  Location: [place];  Service: Orthopedics;  Laterality: Right;    Assessment & Plan Clinical Impression: Patient is a 64 y.o. 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 He was admitted on 05/10/15 for R-BKA by Dr. Lorin Mercy. Post op pain improving but continues to have some phantom pain. Noted to have leucocytosis and ABLA is stable. Therapy ongoing and CIR was recommended for follow up therapy.  Patient transferred to CIR on 05/14/2015 .    Patient currently requires total to moderate assistance with basic self-care skills secondary to muscle weakness and new R BKA with history of L AKA.  Prior to hospitalization, patient could complete BADLs and IADLs with modified independent .  Patient will benefit  from skilled intervention to decrease level of assist with basic self-care skills, increase independence with basic self-care skills and increase level of independence with iADL prior to discharge home with care partner.  Anticipate patient will require 24 hour supervision and follow up outpatient.  OT - End of Session Activity Tolerance: Tolerates 30+ min activity with multiple rests Endurance Deficit: Yes OT Assessment Rehab Potential (ACUTE ONLY): Good OT Patient  demonstrates impairments in the following area(s): Balance;Edema;Endurance;Pain;Skin Integrity OT Basic ADL's Functional Problem(s): Bathing;Dressing;Toileting;Grooming OT Advanced ADL's Functional Problem(s): Simple Meal Preparation OT Transfers Functional Problem(s): Toilet;Tub/Shower OT Plan OT Intensity: Minimum of 1-2 x/day, 45 to 90 minutes OT Frequency: Total of 15 hours over 7 days of combined therapies OT Treatment/Interventions: Balance/vestibular training;Patient/family education;Self Care/advanced ADL retraining;Therapeutic Exercise;Wheelchair propulsion/positioning;Discharge planning;DME/adaptive equipment instruction OT Self Feeding Anticipated Outcome(s): Independent OT Basic Self-Care Anticipated Outcome(s): Modified Independent OT Toileting Anticipated Outcome(s): Supervision to Modified Independent OT Bathroom Transfers Anticipated Outcome(s): Supervision to Modified Independent OT Recommendation Patient destination: Home Follow Up Recommendations: Outpatient OT   Skilled Therapeutic Intervention Patient seen for ADL FIM and evaluation this morning.  Wife present for session.  Patient in wheelchair upon therapist arrival.  Patient deferred bath stating wife had completed with him this morning bedlevel.  Is agreeable to dressing.  Patient able to reach all areas of body except peri area/buttocks for thoroughness.  Patient completes UB dressing with setup and total assistance for LB dressing and toileting.  Patient completes anterior scoot from wheelchair to bed with moderate assistance secondary to incline into bed.  Patient with flat affect, poor postural control, and overall decreased functional mobility and activity tolerance.  Discussed overall goals of patient for inpatient rehabilitation stay and then discussed overall plan of care; patient and wife in agreement.  Rehab potential is good.  OT Evaluation Precautions/Restrictions  Precautions Precautions: Fall Precaution  Comments: L AKA, R BKA  Required Braces or Orthoses: Knee Immobilizer - Right Other Brace/Splint: L prosthesis for mobility Restrictions Weight Bearing Restrictions: Yes RLE Weight Bearing: Non weight bearing General Chart Reviewed: Yes Family/Caregiver Present: Yes Vital Signs Therapy Vitals Pulse Rate: 80 BP: 127/73 mmHg Patient Position (if appropriate): Sitting Oxygen Therapy SpO2: 100 % O2 Device: Not Delivered Pain Pain Assessment Pain Assessment: 0-10 Pain Score: 2  Pain Type: Phantom pain Pain Location: Leg Pain Orientation: Right Pain Intervention(s): Repositioned;Rest;Elevated extremity Home Living/Prior Functioning Home Living Available Help at Discharge: Family, Available 24 hours/day Type of Home: House Home Access: Ramped entrance Home Layout: One level Bathroom Shower/Tub: Walk-in shower (grab bars) Bathroom Toilet: Handicapped height Bathroom Accessibility: Yes Additional Comments: 2004 aka pt then had voc rehab and made home completely handicapped accessible. Also saw Robin in OP for prosthetic training  Lives With: Spouse IADL History Homemaking Responsibilities: Yes Meal Prep Responsibility: Secondary Laundry Responsibility: Secondary Cleaning Responsibility: Secondary Bill Paying/Finance Responsibility: Primary Current License: Yes Mode of Transportation: Car Occupation: Full time employment Type of Occupation: Theme park manager Leisure and Hobbies: reading books, studying, like music Prior Function Level of Independence: Independent with basic ADLs, Requires assistive device for independence  Able to Take Stairs?: Yes Driving: Yes Vocation: Full time employment Comments: only recentlu used wc in community and RW at home. Was before using only cane and aka prosthesis Vision/Perception  Vision- History Baseline Vision/History: No visual deficits Vision- Assessment Vision Assessment?: No apparent visual deficits  Cognition Overall Cognitive Status:  Within Functional Limits for tasks assessed Orientation Level: Place;Person;Situation Person: Oriented Place: Oriented Situation: Oriented Year: 2016 Month: July Day  of Week: Correct Memory: Appears intact Immediate Memory Recall: Sock;Blue;Bed Memory Recall: Sock;Blue;Bed Memory Recall Sock: Without Cue Memory Recall Blue: Without Cue Memory Recall Bed: Without Cue Sensation Sensation Light Touch: Appears Intact (patient reports intermittent neuropathy in B hands) Coordination Gross Motor Movements are Fluid and Coordinated: Yes Fine Motor Movements are Fluid and Coordinated: Yes Finger Nose Finger Test: [facility] Motor  Motor Motor: Within Functional Limits Motor - Skilled Clinical Observations: generalized weakness Trunk/Postural Assessment  Cervical Assessment Cervical Assessment: Within Functional Limits Thoracic Assessment Thoracic Assessment: Within Functional Limits Lumbar Assessment Lumbar Assessment:  (posterior tilt) Postural Control Postural Control:  (generalized trunk weakness)  Balance Balance Balance Assessed: Yes Dynamic Sitting Balance Sitting balance - Comments: pt able to sit EOB and edge of mat to don prosthesis without assist Extremity/Trunk Assessment RUE Assessment RUE Assessment: Exceptions to [facility] RUE AROM (degrees) Overall AROM Right Upper Extremity: Within functional limits for tasks performed RUE Strength Right Shoulder Flexion: 4+/5 Right Shoulder ABduction: 4+/5 Right Elbow Flexion: 4+/5 Right Elbow Extension: 4+/5 Right Wrist Flexion: 4+/5 Right Wrist Extension: 4+/5 LUE Assessment LUE Assessment: Exceptions to WFL LUE AROM (degrees) Overall AROM Left Upper Extremity: Within functional limits for tasks assessed LUE Strength Left Shoulder Flexion: 4+/5 Left Shoulder ABduction: 4+/5 Left Elbow Flexion: 4+/5 Left Elbow Extension: 4+/5 Left Wrist Flexion: 4+/5 Left Wrist Extension: 4+/5  FIM:  Eating: Modified Independent Grooming:  Setup Bathing: Min assist UB dressing: Setup LB dressing: Total assist Toileting: total assist FIM - Bed/Chair Transfer Bed/Chair Transfer: 3: Bed > Chair or W/C: Mod A (lift or lower assist);3: Chair or W/C > Bed: Mod A (lift or lower assist)  Bed <> Toilet Mod A  Refer to Care Plan for Long Term Goals  Recommendations for other services: None  Discharge Criteria: Patient will be discharged from OT if patient refuses treatment 3 consecutive times without medical reason, if treatment goals not met, if there is a change in medical status, if patient makes no progress towards goals or if patient is discharged from hospital.  The above assessment, treatment plan, treatment alternatives and goals were discussed and mutually agreed upon: by patient and by family  Osa Craver 05/15/2015, 11:48 AM

## 2015-05-15 NOTE — Evaluation (Signed)
Physical Therapy Assessment and Plan  Patient Details  Name: [name] MRN: [id number] Date of Birth: August 05, 1951  PT Diagnosis: Impaired sensation and Muscle weakness Rehab Potential: Good ELOS: 10-14 days   Time: 8:30-9:41 71 minutes  Problem List:  Patient Active Problem List   Diagnosis Date Noted  . Status post below knee amputation of right lower extremity 05/14/2015  . History of left above knee amputation 05/14/2015  . Toe amputation status 04/28/2015  . Diabetic foot infection 04/12/2015  . Pressure ulcer 04/12/2015  . OSA (obstructive sleep apnea) 02/23/2015  . Acute confusional state 08/04/2014  . Type 2 diabetes mellitus with other circulatory complications 75/07/2584  . HLD (hyperlipidemia) 08/04/2014  . CKD (chronic kidney disease) 06/28/2014  . Transient global amnesia 06/27/2014  . TIA (transient ischemic attack) 06/27/2014  . Hypotension 11/08/2013  . OBESITY, UNSPECIFIED 08/17/2010  . SYNCOPE 08/17/2010  . CARDIOVASCULAR FUNCTION STUDY, ABNORMAL 08/17/2010  . DM 08/11/2010  . HYPERLIPIDEMIA 08/11/2010  . Essential hypertension 08/11/2010    Past Medical History:  Past Medical History  Diagnosis Date  . Hypertension   . Hyperlipidemia   . Necrosis     #2 nail   . Myocardial infarction     "mild" heart attack  . Poor circulation of extremity   . Sleep apnea     uses cpap, getting a new one  . Type 2 Diabetes mellitus     Type 2  . Arthritis   . Pneumonia     as a child  . CKD (chronic kidney disease) 06/28/2014    Sees Dr Florene Glen   Past Surgical History:  Past Surgical History  Procedure Laterality Date  . Above knee leg amputation Left 2004  . Cataract extraction w/phaco  09/05/2012    Procedure: CATARACT EXTRACTION PHACO AND INTRAOCULAR LENS PLACEMENT (IOC);  Surgeon: Tonny Branch, MD;  Location: [facility];  Service: Ophthalmology;  Laterality: Left;  CDE=5.45  . Cataract extraction w/phaco  10/03/2012    Procedure: CATARACT EXTRACTION PHACO AND  INTRAOCULAR LENS PLACEMENT (IOC);  Surgeon: Tonny Branch, MD;  Location: [facility];  Service: Ophthalmology;  Laterality: Right;  CDE: 12.31  . Colonoscopy    . Amputation Right 04/28/2015    Procedure: AMPUTATION RAY, RIGHT 5TH TOE;  Surgeon: Marybelle Killings, MD;  Location: [place];  Service: Orthopedics;  Laterality: Right;  . Amputation Right 05/10/2015    Procedure: Right Below Knee Amputation;  Surgeon: Marybelle Killings, MD;  Location: [place];  Service: Orthopedics;  Laterality: Right;    Assessment & Plan Clinical Impression: Patient is a  64 y.o. 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 He was admitted on 05/10/15 for R-BKA.  Patient transferred to CIR on 05/14/2015 .   Patient currently requires mod with mobility secondary to muscle weakness and decreased postural control.  Prior to hospitalization, patient was modified independent  with mobility and lived with Spouse in a House home.  Home access is  Ramped entrance.  Patient will benefit from skilled PT intervention to maximize safe functional mobility, minimize fall risk and decrease caregiver burden for planned discharge home with intermittent assist.  Anticipate patient will benefit from follow up [facility] at discharge.  PT - End of Session Activity Tolerance: Tolerates 30+ min activity with multiple rests Endurance Deficit: Yes PT Assessment Rehab Potential (ACUTE/IP ONLY): Good PT Patient demonstrates impairments in the following area(s):  Endurance;Motor;Sensory;Pain PT Transfers Functional Problem(s): Bed Mobility;Bed to Chair;Car;Furniture PT Locomotion Functional Problem(s): Wheelchair Mobility PT Plan PT Intensity: Minimum of 1-2 x/day ,45 to 90 minutes PT Frequency: 5 out of 7 days PT Duration Estimated Length of Stay: 10-14 days PT Treatment/Interventions: Ambulation/gait training;Balance/vestibular training;Community  reintegration;Discharge planning;DME/adaptive equipment instruction;Neuromuscular re-education;Functional mobility training;Pain management;Patient/family education;Stair training;Splinting/orthotics;Therapeutic Activities;Therapeutic Exercise;UE/LE Coordination activities;Wheelchair propulsion/positioning;UE/LE Strength taining/ROM PT Transfers Anticipated Outcome(s): mod I PT Locomotion Anticipated Outcome(s): mod I w/c level PT Recommendation Follow Up Recommendations: Home health PT Patient destination: Home Equipment Recommended: Wheelchair cushion (measurements);Wheelchair (measurements) Equipment Details: hospital bed  Skilled Therapeutic Intervention Pt performed a/p transfer bed <> w/c and w/c <> mat with min A for downhill, mod A for uphill transfers with cues for technique. Pt fatigues easily and requires rest breaks. Attempted to have pt don L LE prosthesis in parallel bars and from hi-lo mat with parallel bars, pt unable to stand with +2 assist.  Pt performed w/c mobility throughout unit with supervision.  Educated pt in desensitization techniques and HEP for LE/UE strenghtening  PT Evaluation Precautions/Restrictions Precautions Precautions: Fall Precaution Comments: L AKA, R BKA  Restrictions RLE Weight Bearing: Non weight bearing Pain Pain Assessment Pain Score: 3  Pain Type: Phantom pain Pain Location: Leg Pain Intervention(s): Repositioned;Rest;Elevated extremity Home Living/Prior Functioning Home Living Available Help at Discharge: Family;Available 24 hours/day Type of Home: House Home Access: Ramped entrance Home Layout: One level Bathroom Shower/Tub: Multimedia programmer: Handicapped height Bathroom Accessibility: Yes  Lives With: Spouse Prior Function Level of Independence: Requires assistive device for independence Driving: Yes Vocation:  Administrator, Civil Service) Comments: only recentlu used wc in community and RW at home. Was before using only cane and aka  prosthesis Cognition Overall Cognitive Status: Within Functional Limits for tasks assessed Sensation Sensation Light Touch: Impaired by gross assessment (phantom pain R LE) Coordination Gross Motor Movements are Fluid and Coordinated: Yes Motor  Motor Motor - Skilled Clinical Observations: generalized weakness  Mobility Transfers Transfers: Yes Anterior-Posterior Transfer: 4: Min assist;3: Mod Investment banker, operational Details (indicate cue type and reason): lifting assist when transfering on uneven surfaces, verbal/tactile cues for technique. pt with B UE and trunk weakness  Locomotion  Ambulation Ambulation: No Stairs / Additional Locomotion Stairs: No Architect: Yes Wheelchair Assistance: 5: Careers information officer: Both upper extremities Wheelchair Parts Management: Supervision/cueing Distance: 100'  Trunk/Postural Assessment  Cervical Assessment Cervical Assessment: Within Functional Limits Thoracic Assessment Thoracic Assessment: Within Functional Limits Lumbar Assessment Lumbar Assessment:  (posterior tilt) Postural Control Postural Control:  (generalized trunk weakness)  Balance Balance Balance Assessed: Yes Dynamic Sitting Balance Sitting balance - Comments: pt able to sit EOB and edge of mat to don prosthesis without assist Extremity Assessment      RLE Assessment RLE Assessment:  (generalized weakness) LLE Assessment LLE Assessment: Within Functional Limits  FIM:  FIM - Bed/Chair Transfer Bed/Chair Transfer: 3: Bed > Chair or W/C: Mod A (lift or lower assist);3: Chair or W/C > Bed: Mod A (lift or lower assist) FIM - Locomotion: Wheelchair Distance: 100' Locomotion: Wheelchair: 2: Travels 50 - 149 ft with supervision, cueing or coaxing FIM - Locomotion: Ambulation Locomotion: Ambulation: 0: Activity did not occur (unable to perform on eval) FIM - Locomotion: Stairs Locomotion: Stairs: 0: Activity did  not occur (unable to perform on eval)   Refer to Care Plan for Long Term Goals  Recommendations for other services: None  Discharge Criteria: Patient will be discharged from PT if patient refuses treatment 3  consecutive times without medical reason, if treatment goals not met, if there is a change in medical status, if patient makes no progress towards goals or if patient is discharged from hospital.  The above assessment, treatment plan, treatment alternatives and goals were discussed and mutually agreed upon: by patient and by family  Xzavian Semmel 05/15/2015, 9:36 AM

## 2015-05-15 NOTE — Progress Notes (Signed)
[name] is a 64 y.o. male 1951/06/04 [id number]  Subjective: No new complaints. No new problems. Slept OK. Pain controlled  Objective: Vital signs in last 24 hours: Temp:  [98 F (36.7 C)-98.4 F (36.9 C)] 98.4 F (36.9 C) (07/16 0558) Pulse Rate:  [75-78] 75 (07/16 0558) Resp:  [18] 18 (07/16 0558) BP: (116-126)/(67-81) 116/81 mmHg (07/16 0558) SpO2:  [100 %] 100 % (07/16 0558) Weight:  [102.468 kg (225 lb 14.4 oz)] 102.468 kg (225 lb 14.4 oz) (07/15 1706) Weight change:  Last BM Date: 05/13/15  Intake/Output from previous day: 07/15 0701 - 07/16 0700 In: 240 [P.O.:240] Out: 1125 [Urine:1125]  Physical Exam General: No apparent distress   In WC, finishing therapy session - family in room Lungs: Normal effort. Lungs clear to auscultation, no crackles or wheezes. Cardiovascular: Regular rate and rhythm, no edema Wounds: dressing clean, dry, intact. No signs of infection.  Lab Results: BMET    Component Value Date/Time   NA 139 05/15/2015 0500   NA 143 02/23/2015 0931   K 4.3 05/15/2015 0500   CL 106 05/15/2015 0500   CO2 25 05/15/2015 0500   GLUCOSE 130* 05/15/2015 0500   GLUCOSE 85 02/23/2015 0931   BUN 21* 05/15/2015 0500   BUN 32* 02/23/2015 0931   CREATININE 1.49* 05/15/2015 0500   CALCIUM 8.3* 05/15/2015 0500   GFRNONAA 49* 05/15/2015 0500   GFRAA 56* 05/15/2015 0500   CBC    Component Value Date/Time   WBC 10.6* 05/15/2015 0500   WBC 6.7 10/27/2013 1305   WBC 6.1 06/03/2013 1145   RBC 3.08* 05/15/2015 0500   RBC 4.57 10/27/2013 1305   RBC 4.5* 06/03/2013 1145   HGB 9.0* 05/15/2015 0500   HGB 13.1* 06/03/2013 1145   HCT 27.0* 05/15/2015 0500   HCT 40.2* 06/03/2013 1145   PLT 284 05/15/2015 0500   MCV 87.7 05/15/2015 0500   MCV 89.3 06/03/2013 1145   MCH 29.2 05/15/2015 0500   MCH 29.5 10/27/2013 1305   MCH 29.2 06/03/2013 1145   MCHC 33.3 05/15/2015 0500   MCHC 33.4 10/27/2013 1305   MCHC 32.6 06/03/2013 1145   RDW 13.8 05/15/2015 0500    RDW 14.2 10/27/2013 1305   LYMPHSABS 2.9 05/15/2015 0500   LYMPHSABS 2.5 10/27/2013 1305   MONOABS 0.6 05/15/2015 0500   EOSABS 0.2 05/15/2015 0500   EOSABS 0.1 10/27/2013 1305   BASOSABS 0.0 05/15/2015 0500   BASOSABS 0.0 10/27/2013 1305   CBG's (last 3):   Recent Labs  05/14/15 2128 05/15/15 0118 05/15/15 0707  GLUCAP 110* 145* 133*   LFT's Lab Results  Component Value Date   ALT 26 05/15/2015   AST 49* 05/15/2015   ALKPHOS 70 05/15/2015   BILITOT 0.2* 05/15/2015    Studies/Results: No results found.  Medications:  I have reviewed the patient's current medications. Scheduled Medications: . atenolol  25 mg Oral Daily  . cilostazol  100 mg Oral BID  . clopidogrel  75 mg Oral Daily  . docusate sodium  100 mg Oral BID  . enoxaparin (LOVENOX) injection  40 mg Subcutaneous Q24H  . furosemide  40 mg Oral Daily  . insulin aspart  0-5 Units Subcutaneous QHS  . insulin aspart  8-12 Units Subcutaneous TID WC  . insulin glargine  34 Units Subcutaneous QHS  . polyethylene glycol  17 g Oral Daily  . rosuvastatin  20 mg Oral Daily   PRN Medications: acetaminophen, alum & mag hydroxide-simeth, bisacodyl, diphenhydrAMINE, guaiFENesin-dextromethorphan, methocarbamol, ondansetron **  OR** ondansetron (ZOFRAN) IV, oxyCODONE-acetaminophen **AND** oxyCODONE, prochlorperazine **OR** prochlorperazine **OR** prochlorperazine, traZODone  Assessment/Plan: Principal Problem:   Status post below knee amputation of right lower extremity Active Problems:   Essential hypertension   CKD (chronic kidney disease)   Type 2 diabetes mellitus with other circulatory complications   History of left above knee amputation  1. Functional deficits secondary to R BKA 05/10/2015  -continue IP rehan efforts as ongoing 2. DVT Prophylaxis/Anticoagulation: Lovenox 3. Pain Management: Oxycodone as needed. Comfortable--no changes needed 4. Hypertension -continue current meds. Monitor with increased  activity 5. Dyslipidemia. Continue statin 6. Type 2 diabetes. Monitor CBGs. Sliding scale insulin as needed and continue current therapy 7. Chronic kidney disease. Creatinine stable. Follow-up nephrologist after discharge as planned 8. Obstructive sleep apnea. CPAP use as home  Length of stay, days: 1  Tavaria Mackins A. Asa Lente, MD 05/15/2015, 10:24 AM

## 2015-05-16 ENCOUNTER — Inpatient Hospital Stay (HOSPITAL_COMMUNITY): Payer: BLUE CROSS/BLUE SHIELD | Admitting: Physical Therapy

## 2015-05-16 DIAGNOSIS — N189 Chronic kidney disease, unspecified: Secondary | ICD-10-CM

## 2015-05-16 DIAGNOSIS — E1159 Type 2 diabetes mellitus with other circulatory complications: Secondary | ICD-10-CM

## 2015-05-16 LAB — GLUCOSE, CAPILLARY
GLUCOSE-CAPILLARY: 164 mg/dL — AB (ref 65–99)
GLUCOSE-CAPILLARY: 116 mg/dL — AB (ref 65–99)
Glucose-Capillary: 240 mg/dL — ABNORMAL HIGH (ref 65–99)
Glucose-Capillary: 127 mg/dL — ABNORMAL HIGH (ref 65–99)

## 2015-05-16 NOTE — Progress Notes (Signed)
Physical Therapy Note  Patient Details  Name: [name] MRN: [id number] Date of Birth: 1951-08-08 Today's Date: 05/16/2015    Time: 1110-1140 30 minutes  1:1 No c/o pain. Pt reports he had no phantom pain last night.  Treatment session focused on therex for UE and LE strengthening. Supine LE 2 x 15 SLR, SAQ, hip abd/add, glut squeeze. UE shld flex, abd, diagonals with 3# wt.  Pt performed a/p transfers to uneven surfaces with min A, cues for technique.   DONAWERTH,KAREN 05/16/2015, 11:42 AM

## 2015-05-16 NOTE — Progress Notes (Signed)
[name] is a 64 y.o. male 1950/12/22 [id number]  Subjective: No new complaints. No new problems. Slept well. Feeling OK.  Objective: Vital signs in last 24 hours: Temp:  [98.6 F (37 C)-99 F (37.2 C)] 98.6 F (37 C) (07/17 0548) Pulse Rate:  [70-81] 70 (07/17 0548) Resp:  [18] 18 (07/17 0548) BP: (114-127)/(67-73) 114/71 mmHg (07/17 0548) SpO2:  [100 %] 100 % (07/17 0548) Weight change:  Last BM Date: 05/13/15  Intake/Output from previous day: 07/16 0701 - 07/17 0700 In: 720 [P.O.:720] Out: 500 [Urine:500]  Physical Exam General: No apparent distress   Wife at side Lungs: Normal effort. Lungs clear to auscultation, no crackles or wheezes. Cardiovascular: Regular rate and rhythm, no edema Musculoskeletal:  R BKA Wounds: dressing  Clean, dry, intact. No signs of infection.  Lab Results: BMET    Component Value Date/Time   NA 139 05/15/2015 0500   NA 143 02/23/2015 0931   K 4.3 05/15/2015 0500   CL 106 05/15/2015 0500   CO2 25 05/15/2015 0500   GLUCOSE 130* 05/15/2015 0500   GLUCOSE 85 02/23/2015 0931   BUN 21* 05/15/2015 0500   BUN 32* 02/23/2015 0931   CREATININE 1.49* 05/15/2015 0500   CALCIUM 8.3* 05/15/2015 0500   GFRNONAA 49* 05/15/2015 0500   GFRAA 56* 05/15/2015 0500   CBC    Component Value Date/Time   WBC 10.6* 05/15/2015 0500   WBC 6.7 10/27/2013 1305   WBC 6.1 06/03/2013 1145   RBC 3.08* 05/15/2015 0500   RBC 4.57 10/27/2013 1305   RBC 4.5* 06/03/2013 1145   HGB 9.0* 05/15/2015 0500   HGB 13.1* 06/03/2013 1145   HCT 27.0* 05/15/2015 0500   HCT 40.2* 06/03/2013 1145   PLT 284 05/15/2015 0500   MCV 87.7 05/15/2015 0500   MCV 89.3 06/03/2013 1145   MCH 29.2 05/15/2015 0500   MCH 29.5 10/27/2013 1305   MCH 29.2 06/03/2013 1145   MCHC 33.3 05/15/2015 0500   MCHC 33.4 10/27/2013 1305   MCHC 32.6 06/03/2013 1145   RDW 13.8 05/15/2015 0500   RDW 14.2 10/27/2013 1305   LYMPHSABS 2.9 05/15/2015 0500   LYMPHSABS 2.5 10/27/2013 1305   MONOABS  0.6 05/15/2015 0500   EOSABS 0.2 05/15/2015 0500   EOSABS 0.1 10/27/2013 1305   BASOSABS 0.0 05/15/2015 0500   BASOSABS 0.0 10/27/2013 1305   CBG's (last 3):   Recent Labs  05/15/15 1646 05/15/15 2112 05/16/15 0644  GLUCAP 154* 144* 164*   LFT's Lab Results  Component Value Date   ALT 26 05/15/2015   AST 49* 05/15/2015   ALKPHOS 70 05/15/2015   BILITOT 0.2* 05/15/2015    Studies/Results: No results found.  Medications:  I have reviewed the patient's current medications. Scheduled Medications: . atenolol  25 mg Oral Daily  . cilostazol  100 mg Oral BID  . clopidogrel  75 mg Oral Daily  . docusate sodium  100 mg Oral BID  . enoxaparin (LOVENOX) injection  40 mg Subcutaneous Q24H  . furosemide  40 mg Oral Daily  . insulin aspart  0-5 Units Subcutaneous QHS  . insulin aspart  8-12 Units Subcutaneous TID WC  . insulin glargine  34 Units Subcutaneous QHS  . polyethylene glycol  17 g Oral Daily  . rosuvastatin  20 mg Oral Daily   PRN Medications: acetaminophen, alum & mag hydroxide-simeth, bisacodyl, diphenhydrAMINE, guaiFENesin-dextromethorphan, methocarbamol, ondansetron **OR** ondansetron (ZOFRAN) IV, oxyCODONE-acetaminophen **AND** oxyCODONE, prochlorperazine **OR** prochlorperazine **OR** prochlorperazine, traZODone  Assessment/Plan: Principal Problem:  Status post below knee amputation of right lower extremity Active Problems:   Essential hypertension   CKD (chronic kidney disease)   Type 2 diabetes mellitus with other circulatory complications   History of left above knee amputation  1. Functional deficits secondary to R BKA 05/10/2015 -continue IP rehab as ongoing 2. DVT Prophylaxis/Anticoagulation: Lovenox 3. Pain Management: Oxycodone as needed. Comfortable--no changes needed 4. Hypertension -continue current meds. Monitor with increased activity 5. Dyslipidemia. Continue statin 6. Type 2 diabetes. Monitor CBGs. Sliding scale insulin as needed and  continue current therapy 7. Chronic kidney disease. Creatinine stable. Follow-up nephrologist after discharge as planned 8. Obstructive sleep apnea. CPAP use as home  Length of stay, days: 2  Minetta Krisher A. Asa Lente, MD 05/16/2015, 9:06 AM

## 2015-05-17 ENCOUNTER — Inpatient Hospital Stay (HOSPITAL_COMMUNITY): Payer: BLUE CROSS/BLUE SHIELD | Admitting: Rehabilitation

## 2015-05-17 ENCOUNTER — Inpatient Hospital Stay (HOSPITAL_COMMUNITY): Payer: BLUE CROSS/BLUE SHIELD

## 2015-05-17 ENCOUNTER — Inpatient Hospital Stay (HOSPITAL_COMMUNITY): Payer: BLUE CROSS/BLUE SHIELD | Admitting: Physical Therapy

## 2015-05-17 ENCOUNTER — Ambulatory Visit: Payer: BLUE CROSS/BLUE SHIELD | Admitting: Infectious Disease

## 2015-05-17 ENCOUNTER — Inpatient Hospital Stay (HOSPITAL_COMMUNITY)
Admission: RE | Admit: 2015-05-17 | Payer: BLUE CROSS/BLUE SHIELD | Source: Intra-hospital | Admitting: Physical Medicine & Rehabilitation

## 2015-05-17 LAB — GLUCOSE, CAPILLARY
GLUCOSE-CAPILLARY: 120 mg/dL — AB (ref 65–99)
GLUCOSE-CAPILLARY: 102 mg/dL — AB (ref 65–99)
GLUCOSE-CAPILLARY: 106 mg/dL — AB (ref 65–99)
Glucose-Capillary: 80 mg/dL (ref 65–99)
Glucose-Capillary: 166 mg/dL — ABNORMAL HIGH (ref 65–99)

## 2015-05-17 NOTE — Progress Notes (Signed)
Occupational Therapy Session Note  Patient Details  Name: [name] MRN: [id number] Date of Birth: 1950/11/04  Today's Date: 05/17/2015 OT Individual Time: 0800-0900 OT Individual Time Calculation (min): 60 min    Short Term Goals: Week 1:  OT Short Term Goal 1 (Week 1): Patient will complete toilet transfers with minimum assistance. OT Short Term Goal 2 (Week 1): Patient will complete LB dressing with moderate assistance. OT Short Term Goal 3 (Week 1): Patient will complete UB dressing with modified independence. OT Short Term Goal 4 (Week 1): Patient will complete shower transfers with minimum assistance. OT Short Term Goal 5 (Week 1): Patient will complete grooming independently at the sink.  Skilled Therapeutic Interventions/Progress Updates:    Pt resting in bed upon arrival and agreeable to therapy.  Pt completed bathing and dressing tasks at bed level with supervision/set up.  Pt performed lateral leans to complete LB bathing and dressing tasks.  Pt performed A/P transfer to w/c with supervision (assist to prevent w/c from moving).  Pt completed grooming tasks at sink before propelling to therapy gym for BUE therex on [employer] (random at work load 4 for 10 mins). Focus on activity tolerance, bed mobility, functional transfers, BUE therex, BADL retraining, discharge planning, and safety awareness.  Therapy Documentation Precautions:  Precautions Precautions: Fall Precaution Comments: L AKA, R BKA  Required Braces or Orthoses: Knee Immobilizer - Right Other Brace/Splint: L prosthesis for mobility Restrictions Weight Bearing Restrictions: Yes RLE Weight Bearing: Non weight bearing Pain: Pain Assessment Pain Assessment: No/denies pain Pain Score: 0-No pain  See FIM for current functional status  Therapy/Group: Individual Therapy  Leroy Libman 05/17/2015, 9:06 AM

## 2015-05-17 NOTE — IPOC Note (Signed)
Overall Plan of Care [facility]) Patient Details Name: [name] MRN: [id number] DOB: [birth year]  Admitting Diagnosis: R BKA  old l [facility] Problems: Principal Problem:   Status post below knee amputation of right lower extremity Active Problems:   Essential hypertension   CKD (chronic kidney disease)   Type 2 diabetes mellitus with other circulatory complications   History of left above knee amputation     Functional Problem List: Nursing Medication Management, Pain, Safety, Skin Integrity  PT Endurance, Motor, Sensory, Pain  OT Balance, Edema, Endurance, Pain, Skin Integrity  SLP    TR Edema, Endurance, Motor, Pain, Safety, Sensory, Skin Integrity       Basic ADL's: OT Bathing, Dressing, Toileting, Grooming     Advanced  ADL's: OT Simple Meal Preparation     Transfers: PT Bed Mobility, Bed to Chair, Car, Manufacturing systems engineer, Metallurgist: PT Wheelchair Mobility     Additional Impairments: OT    SLP        TR      Anticipated Outcomes Item Anticipated Outcome  Self Feeding Independent  Swallowing      Basic self-care  Modified Independent  Toileting  Supervision to Modified Independent   Bathroom Transfers Supervision to Modified Independent  Bowel/Bladder  manage bowel and bladder independent  Transfers  mod I  Locomotion  mod I w/c level  Communication     Cognition     Pain  4 or less  Safety/Judgment  supervision   Therapy Plan: PT Intensity: Minimum of 1-2 x/day ,45 to 90 minutes PT Frequency: 5 out of 7 days PT Duration Estimated Length of Stay: 10-14 days OT Intensity: Minimum of 1-2 x/day, 45 to 90 minutes OT Frequency: Total of 15 hours over 7 days of combined therapies OT Duration/Estimated Length of Stay: 12-14 days         Team Interventions: Nursing Interventions Disease Management/Prevention, Pain Management, Medication Management, Skin Care/Wound Management  PT interventions Ambulation/gait training,  Training and development officer, Community reintegration, Discharge planning, DME/adaptive equipment instruction, Neuromuscular re-education, Functional mobility training, Pain management, Patient/family education, Stair training, Splinting/orthotics, Therapeutic Activities, Therapeutic Exercise, UE/LE Coordination activities, Wheelchair propulsion/positioning, UE/LE Strength taining/ROM  OT Interventions Training and development officer, Patient/family education, Self Care/advanced ADL retraining, Therapeutic Exercise, Wheelchair propulsion/positioning, Discharge planning, DME/adaptive equipment instruction  SLP Interventions    TR Interventions Adaptive equipment instruction, 1:1 session, Training and development officer, Functional mobility training, Patient/family education, Academic librarian, Recreation/leisure participation, Therapeutic activities, Therapeutic exercise, UE/LE Coordination activities, Wheelchair propulsion/positioning  SW/CM Interventions Discharge Planning, Barrister's clerk, Patient/Family Education    Team Discharge Planning: Destination: PT-Home ,OT- Home , SLP-  Projected Follow-up: PT-Home health PT, OT-  Outpatient OT, SLP-  Projected Equipment Needs: PT-Wheelchair cushion (measurements), Wheelchair (measurements), OT-  , SLP-  Equipment Details: PT-hospital bed, OT-  Patient/family involved in discharge planning: PT- Patient, Family Midwife,  OT-Patient, Family member/caregiver, SLP-   MD ELOS: 7-10d Medical Rehab Prognosis:  Good Assessment: 64 y.o. 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 He was admitted on 05/10/15 for R-BKA by Dr. Lorin Mercy. Post op pain improving but continues to have some phantom pain   Now requiring 24/7 Rehab RN,MD, as well as CIR level PT, OT .  Treatment team will focus on ADLs and mobility with goals set at Sup/Mod I  See Team  Conference Notes for weekly updates to the plan of  care 

## 2015-05-17 NOTE — Progress Notes (Signed)
Physical Therapy Session Note  Patient Details  Name: [name] MRN: [id number] Date of Birth: 12-06-1950  Today's Date: 05/17/2015 PT Individual Time: 1100-1200 PT Individual Time Calculation (min): 60 min   Short Term Goals: Week 1:  PT Short Term Goal 1 (Week 1): pt will perform functional transfers with supervision PT Short Term Goal 2 (Week 1): pt will propel w/c in home environement with supervision 50'  Skilled Therapeutic Interventions/Progress Updates:    Pt received seated in w/c with wife present; no c/o pain and agreeable to treatment. Propelled w/c to therapy gym approx 100' with supervision. A/p transfer w/c > mat table with supervision and pt verbalizing steps to perform transfer to A with ability to direct care and request assistance when needed. On edge of mat table performed trunk/core control exercises including ball hits with 5# weighted bar and beach ball, trunk rotation with 4# ball, and semi-reclined crunches with stability ball. In supine/sidelying performed straight leg raise, hip abduction (sidelying) with RLE body weight and LLE with 4# cuff weight. Pt educated on transfers with transfer board; discussed benefits of allowing transfers to a variety of surfaces; pt given demonstration by therapist. Performed transfer mat table > w/c with transfer board with minA/CGA; required min cues for board placement and scooting. Discussed with pt head-hips relationship to improve clearance of ischial tuberosities off surface to prevent sheer force on skin. Performed w/c propulsion for > 300' in hallway with supervision for the goal of improving aerobic endurance; required two rest breaks due to fatigue. Pt returned to room and remained seated in w/c at completion of session with all needs within reach and family present.   Therapy Documentation Precautions:  Precautions Precautions: Fall Precaution Comments: L AKA, R BKA  Required Braces or Orthoses: Knee Immobilizer - Right Other  Brace/Splint: L prosthesis for mobility Restrictions Weight Bearing Restrictions: Yes RLE Weight Bearing: Non weight bearing Pain: Pain Assessment Pain Assessment: No/denies pain Pain Score: 0-No pain Locomotion : Wheelchair Mobility Distance: 150   See FIM for current functional status  Therapy/Group: Individual Therapy  Luberta Mutter 05/17/2015, 12:15 PM

## 2015-05-17 NOTE — Progress Notes (Signed)
[facility] PHYSICAL MEDICINE & REHABILITATION     PROGRESS NOTE    Subjective/Complaints: Had a pretty good weekend. Pain controlled generally. Leg does throb a bit when he's up. ROS: Pt denies fever, rash/itching, headache, blurred or double vision, nausea, vomiting, abdominal pain, diarrhea, chest pain, shortness of breath, palpitations, dysuria, dizziness, neck or back pain, bleeding, anxiety, or depression   Objective: Vital Signs: Blood pressure 112/72, pulse 70, temperature 98.9 F (37.2 C), temperature source Oral, resp. rate 18, weight 102.468 kg (225 lb 14.4 oz), SpO2 100 %. No results found.  Recent Labs  05/15/15 0500  WBC 10.6*  HGB 9.0*  HCT 27.0*  PLT 284    Recent Labs  05/15/15 0500  NA 139  K 4.3  CL 106  GLUCOSE 130*  BUN 21*  CREATININE 1.49*  CALCIUM 8.3*   CBG (last 3)   Recent Labs  05/16/15 1703 05/16/15 2128 05/17/15 0703  GLUCAP 127* 116* 80    Wt Readings from Last 3 Encounters:  05/14/15 102.468 kg (225 lb 14.4 oz)  05/10/15 111.273 kg (245 lb 5 oz)  04/28/15 111.299 kg (245 lb 5.9 oz)    Physical Exam:  HENT:  Head: Normocephalic and atraumatic.  Mouth/Throat: Oropharynx is clear and moist. No oropharyngeal exudate.  Eyes: Conjunctivae are normal. Pupils are equal, round, and reactive to light.  Neck: Normal range of motion. Neck supple. No JVD present. No tracheal deviation present. No thyromegaly present.  Cardiovascular: Normal rate, regular rhythm and normal heart sounds. Exam reveals no friction rub.  No murmur heard. Respiratory: Effort normal and breath sounds normal. No respiratory distress. He has no wheezes. He has no rales.  GI: Soft. Bowel sounds are normal. He exhibits no distension. There is no tenderness. There is no rebound.  Musculoskeletal: He exhibits tenderness.  R-AKA with persistent edema. Moderate s/s drainage centrally. Area clean. L-AKA well healed.  Lymphadenopathy:   He has no cervical  adenopathy.  Neurological: He is alert and oriented to person, place, and time. He displays normal reflexes. No cranial nerve deficit. He exhibits normal muscle tone. Coordination normal.  Pleasant and appropriate. Speech clear. Able to follow commands without difficulty. UE 5/5 delt,bicep, tircep, HI. RLE: 2/5 HF, 2/5 KE. LLE: 4/5 HF. Sensation grossly intact.  Skin: Skin is warm and dry.  Psychiatric: He has a normal mood and affect. His behavior is normal. Judgment and thought content normal.     Assessment/Plan: 1. Functional deficits secondary to new right BKA/ prior left AKA which require 3+ hours per day of interdisciplinary therapy in a comprehensive inpatient rehab setting. Physiatrist is providing close team supervision and 24 hour management of active medical problems listed below. Physiatrist and rehab team continue to assess barriers to discharge/monitor patient progress toward functional and medical goals. FIM: FIM - Bathing Bathing Steps Patient Completed: Chest, Right Arm, Left Arm, Abdomen, Right upper leg, Left upper leg Bathing: 4: Min-Patient completes 8-9 2f 10 parts or 75+ percent  FIM - Upper Body Dressing/Undressing Upper body dressing/undressing steps patient completed: Thread/unthread right sleeve of pullover shirt/dresss, Thread/unthread left sleeve of pullover shirt/dress, Put head through opening of pull over shirt/dress, Pull shirt over trunk Upper body dressing/undressing: 5: Set-up assist to: Obtain clothing/put away FIM - Lower Body Dressing/Undressing Lower body dressing/undressing: 1: Total-Patient completed less than 25% of tasks  FIM - Toileting Toileting steps completed by patient: Adjust clothing prior to toileting, Adjust clothing after toileting Toileting: 3: Mod-Patient completed 2 of 3 steps  FIM - Air cabin crew Transfers: 0-Activity did not occur  FIM - Control and instrumentation engineer Devices: Arm  rests Bed/Chair Transfer: 3: Supine > Sit: Mod A (lifting assist/Pt. 50-74%/lift 2 legs  FIM - Locomotion: Wheelchair Distance: 100' Locomotion: Wheelchair: 2: Travels 50 - 149 ft with supervision, cueing or coaxing FIM - Locomotion: Ambulation Locomotion: Ambulation: 0: Activity did not occur (unable to perform on eval)  Comprehension Comprehension Mode: Auditory Comprehension: 7-Follows complex conversation/direction: With no assist  Expression Expression Mode: Verbal Expression: 7-Expresses complex ideas: With no assist  Social Interaction Social Interaction: 7-Interacts appropriately with others - No medications needed.  Problem Solving Problem Solving: 7-Solves complex problems: Recognizes & self-corrects  Memory Memory: 7-Complete Independence: No helper  Medical Problem List and Plan: 1. Functional deficits secondary to s/p right BKA (hx of left AKA) 2. DVT Prophylaxis/Anticoagulation:  Lovenox--continue given VTE risk 3. Pain Management: Current regimen of percocet and robaxin effective. Will continue and augment as indicated.  4. Mood: LCSW to follow for evaluation and support. Team to encourage also 5. Neuropsych: This patient is capable of making decisions on his own behalf. 6. Skin/Wound Care: Monitor wound daily.   -wound with mild to moderate S/S drainage---continue dry dressing QD to BID 7. Fluids/Electrolytes/Nutrition: Monitor I/O. Encourage PO.  8. DM type 2: Po intake poor at this time. Recommended supplements from home tid- qid (which he prefers). Monitor BS with ac/hs checks. Continue lantus insulin and custom SSI. -fair control in general---cover elevations with SSI for now  9. ABLA: Monitor for stability. Recheck in am.  10. HTN: Monitor bid. Continue tenormin and lasix daily.    -normotensive at present 11. OSA: CPAP at bedtime 12. CKD: CR 1.49 (below baseline CR)----recheck later this week 13. Leukocytosis: some  improvement  LOS (Days) 3 A FACE TO FACE EVALUATION WAS PERFORMED  Camdon Saetern T 05/17/2015 8:44 AM

## 2015-05-17 NOTE — Progress Notes (Signed)
Social Work  Social Work Assessment and Plan  Patient Details  Name: [name] MRN: [id number] Date of Birth: [birth year]  Today's Date: 05/17/2015  Problem List:  Patient Active Problem List   Diagnosis Date Noted  . Status post below knee amputation of right lower extremity 05/14/2015  . History of left above knee amputation 05/14/2015  . Toe amputation status 04/28/2015  . Diabetic foot infection 04/12/2015  . Pressure ulcer 04/12/2015  . OSA (obstructive sleep apnea) 02/23/2015  . Acute confusional state 08/04/2014  . Type 2 diabetes mellitus with other circulatory complications [id number]  . HLD (hyperlipidemia) 08/04/2014  . CKD (chronic kidney disease) 06/28/2014  . Transient global amnesia 06/27/2014  . TIA (transient ischemic attack) 06/27/2014  . Hypotension 11/08/2013  . OBESITY, UNSPECIFIED 08/17/2010  . SYNCOPE 08/17/2010  . CARDIOVASCULAR FUNCTION STUDY, ABNORMAL 08/17/2010  . DM 08/11/2010  . HYPERLIPIDEMIA 08/11/2010  . Essential hypertension 08/11/2010   Past Medical History:  Past Medical History  Diagnosis Date  . Hypertension   . Hyperlipidemia   . Necrosis     #2 nail   . Myocardial infarction     "mild" heart attack  . Poor circulation of extremity   . Sleep apnea     uses cpap, getting a new one  . Type 2 Diabetes mellitus     Type 2  . Arthritis   . Pneumonia     as a child  . CKD (chronic kidney disease) 06/28/2014    Sees Dr Florene Glen   Past Surgical History:  Past Surgical History  Procedure Laterality Date  . Above knee leg amputation Left 2004  . Cataract extraction w/phaco  09/05/2012    Procedure: CATARACT EXTRACTION PHACO AND INTRAOCULAR LENS PLACEMENT (IOC);  Surgeon: Tonny Branch, MD;  Location: [facility];  Service: Ophthalmology;  Laterality: Left;  CDE=5.45  . Cataract extraction w/phaco  10/03/2012    Procedure: CATARACT EXTRACTION PHACO AND INTRAOCULAR LENS PLACEMENT (IOC);  Surgeon: Tonny Branch, MD;  Location: [facility];  Service:  Ophthalmology;  Laterality: Right;  CDE: 12.31  . Colonoscopy    . Amputation Right 04/28/2015    Procedure: AMPUTATION RAY, RIGHT 5TH TOE;  Surgeon: Marybelle Killings, MD;  Location: [facility];  Service: Orthopedics;  Laterality: Right;  . Amputation Right 05/10/2015    Procedure: Right Below Knee Amputation;  Surgeon: Marybelle Killings, MD;  Location: [place];  Service: Orthopedics;  Laterality: Right;   Social History:  reports that he has never smoked. He has never used smokeless tobacco. He reports that he does not drink alcohol or use illicit drugs.  Family / Support Systems Marital Status: Married Patient Roles: Spouse, Parent, Other (Comment) Administrator, Civil Service) Spouse/Significant Other: wife, Makaveli Perezgonzalez @ [phone] or ([phone] Children: daughter, Bretley Mendillo [facility]) @ (C) [phone] and daughter, Neiko Wik [place]) ([place] [phone] Anticipated Caregiver: wife and two daughters Ability/Limitations of Caregiver: wife retired and can provide min assist level Caregiver Availability: 24/7 Family Dynamics: Wife very attentive and supportive.  Pt notes that daughters are also involved.  Social History Preferred language: English Religion: Baptist Cultural Background: NA Education: college Read: Yes Write: Yes Employment Status: Employed Name of Employer: Pastor @ [employer] Return to Work Plans: Pt fully intends to return to his church when he is able. Legal Hisotry/Current Legal Issues: None Guardian/Conservator: None - per MD, pt capable of making decisions on his own behalf   Abuse/Neglect Physical Abuse: Denies Verbal Abuse: Denies Sexual Abuse: Denies  Exploitation of patient/patient's resources: Denies Self-Neglect: Denies  Emotional Status Pt's affect, behavior adn adjustment status: Pt with rather flat affect but does complete assessment interview without difficulty.  He quickly denies any s/s of emotional distress either prior to or since his surgery.  Defer formal depressions  screen at this time.  Pt states he feels motivated for CIR therapies. Recent Psychosocial Issues: None Pyschiatric History: None Substance Abuse History: None  Patient / Family Perceptions, Expectations & Goals Pt/Family understanding of illness & functional limitations: Pt and wife with good understanding of surgery performed and reasons for surgery.  Good understanding of current funcitonal limitations/ need for CIR. Premorbid pt/family roles/activities: Pt was independent overall with his prosthesis. Anticipated changes in roles/activities/participation: may need some assist from wife initially dependent on gains made Pt/family expectations/goals: "I want to be able to walk with both prosthesis."  [employer]: None Premorbid Home Care/DME Agencies: Other (Comment) ([facility] just PTA;  OP Rehab after AKA) Transportation available at discharge: yes Resource referrals recommended: Support group (specify)  Discharge Planning Living Arrangements: Spouse/significant other Support Systems: Spouse/significant other, Children, Other relatives, Church/faith community, Friends/neighbors Type of Residence: Private residence Insurance Resources: Multimedia programmer (specify) Nurse, mental health) Financial Resources: Employment Museum/gallery curator Screen Referred: No Living Expenses: Higher education careers adviser Management: Patient Does the patient have any problems obtaining your medications?: No Home Management: wife primarily Patient/Family Preliminary Plans: Pt to return home with his wife as primary caregiver;  intermittent assist of daughters Social Work Anticipated Follow Up Needs: HH/OP Expected length of stay: ELOS 7-10 days  Clinical Impression Pleasant gentleman here following BKA.  Rather flat affect but does complete interview without difficulty.  Wife at bedside and supportive.  Both deny any issues with level of assist available at d/c.  Also, daughter's are local and able to assist intermittently.   No s/s of any emotional distress - will monitor.  To follow for support and d/c planning needs.  Katherinne Mofield 05/17/2015, 3:06 PM

## 2015-05-17 NOTE — Progress Notes (Signed)
Physical Therapy Session Note  Patient Details  Name: [name] MRN: [id number] Date of Birth: 1951/07/26  Today's Date: 05/17/2015 PT Individual Time: 1445-1555 PT Individual Time Calculation (min): 70 min   Short Term Goals: Week 1:  PT Short Term Goal 1 (Week 1): pt will perform functional transfers with supervision PT Short Term Goal 2 (Week 1): pt will propel w/c in home environement with supervision 50'  Skilled Therapeutic Interventions/Progress Updates:   Pt received lying in bed, agreeable to therapy session.  Discussed with pt wearing L prosthesis, however pt explained that due to leg being suction type of leg, that he would stand in order to remove excess air from leg.  Worked with pt to problem solve how he could do this at home safely.  Pt states he does not have sturdy bar at home to pull up onto.  Discussed benefits of wearing LE vs not at home.  Pt performed ant/post transfer to w/c from bed with HOB flat and without rails at S level (set up to stabilize w/c) with min cues for forward trunk lean when scooting.  Pt propelled to therapy gym with BUEs at S level x 100' and another 200' back to room for overall activity tolerance and strengthening.  Once in therapy gym, joined by RT in order to discuss what pt enjoys outside of hospital and how to incorporate those into therapy session.  While discussing this, pt transferred to therapy mat with use of SB at S level however needs mod to max verbal cues for w/c set up, removal of parts and set up of SB.  Once on mat, transitioned into prone for B hip flex stretch and to perform trunk extensor strengthening. Performed hip ext in prone BLE x 10 reps each following by alternating BUE/LE (R arm, L leg and vice versa) elevation to continue to strengthen back musculature.  Tolerated well.  Transitioned to SL to perform pelvic strengthening x 10 reps each side anterior and posterior.  Transitioned into sitting and transferred back to w/c as above  with SB.  Ended session with car transfer.  Performed via anterior scoot as well as SB transfer.  Feel that he can perform more independently with use of SB as he needed heavy assist to stabilize w/c when transferring anteriorly.  Pt verbalized understanding, however still needs cues for set up and placement of SB.  Pt propelled back to room and left in w/c with all needs in reach.    Therapy Documentation Precautions:  Precautions Precautions: Fall Precaution Comments: L AKA, R BKA  Required Braces or Orthoses: Knee Immobilizer - Right Other Brace/Splint: L prosthesis for mobility Restrictions Weight Bearing Restrictions: Yes RLE Weight Bearing: Non weight bearing   Pain: Pain Assessment Pain Assessment: No/denies pain Pain Score: 0-No pain Pain Type: Surgical pain Pain Location: Leg Pain Orientation: Right Pain Intervention(s): Medication (See eMAR)   Locomotion : Wheelchair Mobility Distance: 150   See FIM for current functional status  Therapy/Group: Individual Therapy  Denice Bors 05/17/2015, 3:27 PM

## 2015-05-17 NOTE — Care Management Note (Signed)
[facility] Individual Statement of Services  Patient Name:  [name]  Date:  05/17/2015  Welcome to the [facility].  Our goal is to provide you with an individualized program based on your diagnosis and situation, designed to meet your specific needs.  With this comprehensive rehabilitation program, you will be expected to participate in at least 3 hours of rehabilitation therapies Monday-Friday, with modified therapy programming on the weekends.  Your rehabilitation program will include the following services:  Physical Therapy (PT), Occupational Therapy (OT), 24 hour per day rehabilitation nursing, Therapeutic Recreaction (TR), Case Management (Social Worker), Rehabilitation Medicine, Nutrition Services and Pharmacy Services  Weekly team conferences will be held on Tuesdays to discuss your progress.  Your Social Worker will talk with you frequently to get your input and to update you on team discussions.  Team conferences with you and your family in attendance may also be held.  Expected length of stay: 7-10 days  Overall anticipated outcome: modified independent @ wheelchair  Depending on your progress and recovery, your program may change. Your Social Worker will coordinate services and will keep you informed of any changes. Your Social Worker's name and contact numbers are listed  below.  The following services may also be recommended but are not provided by the [facility] [place] will be made to provide these services after discharge if needed.  Arrangements include referral to agencies that provide these services.  Your insurance has been verified to be:  [employer] [place] Your primary doctor is:  Dr. Alain Honey  Pertinent information will be shared with your doctor and your insurance  company.  Social Worker:  [place], [place] or ([phone]   Information discussed with and copy given to patient by: Lennart Pall, 05/17/2015, 3:09 PM

## 2015-05-17 NOTE — Progress Notes (Signed)
Pt assessed for qhs NIV. Alert and oriented, stable on room air. Pt declines therapy at this time. VSS and NAD noted.

## 2015-05-18 ENCOUNTER — Ambulatory Visit (HOSPITAL_COMMUNITY): Payer: BLUE CROSS/BLUE SHIELD | Admitting: Rehabilitation

## 2015-05-18 ENCOUNTER — Inpatient Hospital Stay (HOSPITAL_COMMUNITY): Payer: BLUE CROSS/BLUE SHIELD | Admitting: Occupational Therapy

## 2015-05-18 ENCOUNTER — Inpatient Hospital Stay (HOSPITAL_COMMUNITY): Payer: BLUE CROSS/BLUE SHIELD

## 2015-05-18 LAB — GLUCOSE, CAPILLARY
GLUCOSE-CAPILLARY: 224 mg/dL — AB (ref 65–99)
Glucose-Capillary: 155 mg/dL — ABNORMAL HIGH (ref 65–99)
Glucose-Capillary: 138 mg/dL — ABNORMAL HIGH (ref 65–99)
Glucose-Capillary: 97 mg/dL (ref 65–99)

## 2015-05-18 NOTE — Progress Notes (Signed)
Occupational Therapy Session Note  Patient Details  Name: [name] MRN: [id number] Date of Birth: [birth year]  Today's Date: 05/18/2015 OT Individual Time: 1300-1400 OT Individual Time Calculation (min): 60 min    Short Term Goals: Week 1:  OT Short Term Goal 1 (Week 1): Patient will complete toilet transfers with minimum assistance. OT Short Term Goal 2 (Week 1): Patient will complete LB dressing with moderate assistance. OT Short Term Goal 3 (Week 1): Patient will complete UB dressing with modified independence. OT Short Term Goal 4 (Week 1): Patient will complete shower transfers with minimum assistance. OT Short Term Goal 5 (Week 1): Patient will complete grooming independently at the sink.  Skilled Therapeutic Interventions/Progress Updates:    Pt seen for OT session focusing on functional activity tolerance, functional transfers, and UE strengthening. Pt in w/c upon arrival, agreeable to tx. Pt voiced having had difficulty managing LB clothing while attempting to use urinal while seated in w/c. Pt educated regarding lateral leans, with recommendation to set-up w/c next to bed, remove arm rests and complete lateral leans onto bed in order to pull down pants. Pt return demonstrated lateral leans to complete clothing management with success, voicing content with recommendations. Pt then completed UE strengthening exercises using #3 dumbbell, requiring VCs to recall UE strengthening exercises taught in previous OT session focusing on B tricep strengthening in prep for functional transfers. Pt then educated on and performed UE strengthening using level II theraband.  Pt then self propelled w/c to therapy gym mod I. Pt required assist to for management of w/c parts. He completed sliding board transfer onto mat with close supervision, able to place board correctly independently. Pt returned to supine on mat and completed B LE strengthening/ ROM exercises, including AB/ADduction and forward flexion,  exercises completed 2 sets of 10 reps. Pt then returned to sitting EOM and completed ball toss task with rec. Therapist with supervision working on functional static and dynamic sitting balance. Pt returned to w/c with min A sliding board transfer with assist for proper positioning of board and to stabilize equipment. Pt educated regarding how to don knee immobilizer, with pt voicing understanding. Pt returned to room at end of session, left sitting up in w/c with all needs in reach.   2 TE 2 TA  Therapy Documentation Precautions:  Precautions Precautions: Fall Precaution Comments: L AKA, R BKA  Required Braces or Orthoses: Knee Immobilizer - Right Other Brace/Splint: L prosthesis for mobility Restrictions Weight Bearing Restrictions: Yes RLE Weight Bearing: Non weight bearing LLE Weight Bearing: Non weight bearing Pain: Pain Assessment Pain Assessment: No/denies pain  See FIM for current functional status  Therapy/Group: Individual Therapy  [name], [name] 05/18/2015, 1:38 PM

## 2015-05-18 NOTE — Progress Notes (Signed)
[name] PHYSICAL MEDICINE & REHABILITATION     PROGRESS NOTE    Subjective/Complaints: No new complaints. Pain under reasonable control. Appetite remains good. Motivated in therapy.  ROS: Pt denies fever, rash/itching, headache, blurred or double vision, nausea, vomiting, abdominal pain, diarrhea, chest pain, shortness of breath, palpitations, dysuria, dizziness, neck or back pain, bleeding, anxiety, or depression   Objective: Vital Signs: Blood pressure 113/77, pulse 78, temperature 98.6 F (37 C), temperature source Oral, resp. rate 18, weight 102.468 kg (225 lb 14.4 oz), SpO2 100 %. No results found. No results for input(s): WBC, HGB, HCT, PLT in the last 72 hours. No results for input(s): NA, K, CL, GLUCOSE, BUN, CREATININE, CALCIUM in the last 72 hours.  Invalid input(s): CO CBG (last 3)   Recent Labs  05/17/15 1644 05/17/15 2059 05/18/15 0701  GLUCAP 166* 106* 155*    Wt Readings from Last 3 Encounters:  05/14/15 102.468 kg (225 lb 14.4 oz)  05/10/15 111.273 kg (245 lb 5 oz)  04/28/15 111.299 kg (245 lb 5.9 oz)    Physical Exam:  HENT:  Head: Normocephalic and atraumatic.  Mouth/Throat: Oropharynx is clear and moist. No oropharyngeal exudate.  Eyes: Conjunctivae are normal. Pupils are equal, round, and reactive to light.  Neck: Normal range of motion. Neck supple. No JVD present. No tracheal deviation present. No thyromegaly present.  Cardiovascular: Normal rate, regular rhythm and normal heart sounds. Exam reveals no friction rub.  No murmur heard. Respiratory: Effort normal and breath sounds normal. No respiratory distress. He has no wheezes. He has no rales.  GI: Soft. Bowel sounds are normal. He exhibits no distension. There is no tenderness. There is no rebound.  Musculoskeletal: He exhibits tenderness.  R-AKA  moderate s/s drainage centrally. Area clean. L-AKA well healed.  Lymphadenopathy:   He has no cervical adenopathy.  Neurological: He is  alert and oriented to person, place, and time. He displays normal reflexes. No cranial nerve deficit. He exhibits normal muscle tone. Coordination normal.  Pleasant and appropriate. Speech clear. Able to follow commands without difficulty. UE 5/5 delt,bicep, tircep, HI. RLE: 2/5 HF, 2/5 KE. LLE: 4/5 HF. Sensation grossly intact.  Skin: Skin is warm and dry.  Psychiatric: He has a normal mood and affect. His behavior is normal. Judgment and thought content normal.     Assessment/Plan: 1. Functional deficits secondary to new right BKA/ prior left AKA which require 3+ hours per day of interdisciplinary therapy in a comprehensive inpatient rehab setting. Physiatrist is providing close team supervision and 24 hour management of active medical problems listed below. Physiatrist and rehab team continue to assess barriers to discharge/monitor patient progress toward functional and medical goals. FIM: FIM - Bathing Bathing Steps Patient Completed: Chest, Right Arm, Left Arm, Abdomen, Right upper leg, Left upper leg, Front perineal area, Buttocks Bathing: 5: Supervision: Safety issues/verbal cues  FIM - Upper Body Dressing/Undressing Upper body dressing/undressing steps patient completed: Thread/unthread right sleeve of pullover shirt/dresss, Thread/unthread left sleeve of pullover shirt/dress, Put head through opening of pull over shirt/dress, Pull shirt over trunk Upper body dressing/undressing: 5: Set-up assist to: Obtain clothing/put away FIM - Lower Body Dressing/Undressing Lower body dressing/undressing steps patient completed: Thread/unthread right underwear leg, Thread/unthread left underwear leg, Pull underwear up/down, Thread/unthread right pants leg, Thread/unthread left pants leg, Pull pants up/down Lower body dressing/undressing: 5: Supervision: Safety issues/verbal cues  FIM - Toileting Toileting steps completed by patient: Adjust clothing prior to toileting, Adjust clothing after  toileting Toileting: 3: Mod-Patient completed  2 of 3 steps  FIM - Radio producer Devices: Bedside commode Toilet Transfers: 4-To toilet/BSC: Min A (steadying Pt. > 75%)  FIM - Bed/Chair Transfer Bed/Chair Transfer Assistive Devices: Arm rests, Bed rails Bed/Chair Transfer: 5: Supine > Sit: Supervision (verbal cues/safety issues), 5: Sit > Supine: Supervision (verbal cues/safety issues), 5: Bed > Chair or W/C: Supervision (verbal cues/safety issues), 5: Chair or W/C > Bed: Supervision (verbal cues/safety issues)  FIM - Locomotion: Wheelchair Distance: 150 Locomotion: Wheelchair: 5: Travels 150 ft or more: maneuvers on rugs and over door sills with supervision, cueing or coaxing FIM - Locomotion: Ambulation Locomotion: Ambulation: 0: Activity did not occur  Comprehension Comprehension Mode: Auditory Comprehension: 7-Follows complex conversation/direction: With no assist  Expression Expression Mode: Verbal Expression: 7-Expresses complex ideas: With no assist  Social Interaction Social Interaction: 7-Interacts appropriately with others - No medications needed.  Problem Solving Problem Solving: 7-Solves complex problems: Recognizes & self-corrects  Memory Memory: 7-Complete Independence: No helper  Medical Problem List and Plan: 1. Functional deficits (impaired mobility/self-care) secondary to s/p right BKA (hx of left AKA)-- 2. DVT Prophylaxis/Anticoagulation:  Lovenox--continue given VTE risk 3. Pain Management: Current regimen of percocet and robaxin remain effective  4. Mood: pt staying up beat. Wife supportive 5. Neuropsych: This patient is capable of making decisions on his own behalf. 6. Skin/Wound Care: Monitor wound daily.   -wound with mild to moderate S/S drainage---continue dry dressing QD to BID(if needed) 7. Fluids/Electrolytes/Nutrition: Monitor I/O. Encourage PO.  8. DM type 2: Po intake poor at this time. Recommended supplements  from home tid- qid (which he prefers). Monitor BS with ac/hs checks. Continue lantus insulin and custom SSI. -fair control in general---cover elevations with SSI for now  9. ABLA:  Most recent count 9. Re-check on Thursday 10. HTN: Monitor bid. Continue tenormin and lasix daily.    -normotensive at present 11. OSA: CPAP at bedtime 12. CKD: CR 1.49 (below baseline CR)----no changes, same diet 13. Leukocytosis: some improvement    LOS (Days) 4 A FACE TO FACE EVALUATION WAS PERFORMED  SWARTZ,ZACHARY T 05/18/2015 7:29 AM

## 2015-05-18 NOTE — Progress Notes (Signed)
Occupational Therapy Session Note  Patient Details  Name: ANTINIO BURTENSHAW MRN: [id number] Date of Birth: 07/16/1951  Today's Date: 05/18/2015 OT Individual Time: 1030-1100 OT Individual Time Calculation (min): 30 min    Short Term Goals: Week 1:  OT Short Term Goal 1 (Week 1): Patient will complete toilet transfers with minimum assistance. OT Short Term Goal 2 (Week 1): Patient will complete LB dressing with moderate assistance. OT Short Term Goal 3 (Week 1): Patient will complete UB dressing with modified independence. OT Short Term Goal 4 (Week 1): Patient will complete shower transfers with minimum assistance. OT Short Term Goal 5 (Week 1): Patient will complete grooming independently at the sink.  Skilled Therapeutic Interventions/Progress Updates:    1:1 Continued practice with slide board and discussed when to use slide board v anterior/ posterior transfers. Pt reported he will want to sit in his recliner/ lift chair at home. Practiced level transfer with slide board in gym w/c<>mat. Able to perform with steadying A. Progressed to slide board transfer into armed cushion chair in family room (uneven transfer). Pt able to transfer into w/c with steady A (downhill) and min A into the w/c (uphill).  Therapist helped to support right residual limb for swelling and positioning. Continued to discuss d/c planning.   Therapy Documentation Precautions:  Precautions Precautions: Fall Precaution Comments: L AKA, R BKA  Required Braces or Orthoses: Knee Immobilizer - Right Other Brace/Splint: L prosthesis for mobility Restrictions Weight Bearing Restrictions: Yes RLE Weight Bearing: Non weight bearing LLE Weight Bearing: Non weight bearing Pain: Pain Assessment Pain Assessment: No/denies pain  See FIM for current functional status  Therapy/Group: Individual Therapy  Willeen Cass [facility] 05/18/2015, 3:13 PM

## 2015-05-18 NOTE — Progress Notes (Signed)
Occupational Therapy Session Note  Patient Details  Name: DEARIUS MCENTIRE MRN: [id number] Date of Birth: 21-Dec-1950  Today's Date: 05/18/2015 OT Individual Time: [id number] OT Individual Time Calculation (min): 60 min   Short Term Goals: Week 1:  OT Short Term Goal 1 (Week 1): Patient will complete toilet transfers with minimum assistance. OT Short Term Goal 2 (Week 1): Patient will complete LB dressing with moderate assistance. OT Short Term Goal 3 (Week 1): Patient will complete UB dressing with modified independence. OT Short Term Goal 4 (Week 1): Patient will complete shower transfers with minimum assistance. OT Short Term Goal 5 (Week 1): Patient will complete grooming independently at the sink.  Skilled Therapeutic Interventions/Progress Updates:  Patient received seated in w/c with wife present post bathing & dressing session. Patient with no complaints of pain. Administered Compression Wrapping handout to patient and wife.  Extensively went over how to perform compression wrapping -> right residual limb (educated, demonstrated, and had wife return demonstrate). Encouraged her to re-wrap limb every 3-4 hours for optimal effectiveness. Patient then engaged in [place] strengthening exercises using theraband and 3# dumbbell; mainly focusing on tricep muscles. Educated patient on importance of pressure relief while seated in w/c and pain management/control for phantom pain. At end of session, left patient seated in w/c with wife present and all needs within reach.   For future sessions, recommended encouraging use of slide board as able and practicing slide board transfers. Discussed use of slide board transfers as more functional way to transfer <> surfaces.   Therapy Documentation Precautions:  Precautions Precautions: Fall Precaution Comments: L AKA, R BKA  Required Braces or Orthoses: Knee Immobilizer - Right Other Brace/Splint: L prosthesis for mobility Restrictions Weight Bearing  Restrictions: Yes RLE Weight Bearing: Non weight bearing LLE Weight Bearing: Non weight bearing  Pain: Pain Assessment Pain Assessment: No/denies pain  See FIM for current functional status  Therapy/Group: Individual Therapy  Stewart Pimenta , MS, OTR/L, CLT Pager: [phone]  05/18/2015, 12:42 PM

## 2015-05-18 NOTE — Progress Notes (Signed)
Occupational Therapy Session Note  Patient Details  Name: [name] MRN: [id number] Date of Birth: 15-Nov-1950  Today's Date: 05/18/2015 OT Individual Time: 0700-0800 OT Individual Time Calculation (min): 60 min    Short Term Goals: Week 1:  OT Short Term Goal 1 (Week 1): Patient will complete toilet transfers with minimum assistance. OT Short Term Goal 2 (Week 1): Patient will complete LB dressing with moderate assistance. OT Short Term Goal 3 (Week 1): Patient will complete UB dressing with modified independence. OT Short Term Goal 4 (Week 1): Patient will complete shower transfers with minimum assistance. OT Short Term Goal 5 (Week 1): Patient will complete grooming independently at the sink.  Skilled Therapeutic Interventions/Progress Updates:    Pt engaged in BADL retraining with focus on sitting balance, bed mobility, A/P transfers, bathing and dressing at bed level, and activity tolerance.  Pt completed all bathing and dressing tasks at bed level with supervision after set up.  Pt incorporated lateral leans to bathe buttocks and pull up pants.  Pt completed grooming tasks at sink before propelling to therapy gym and engaging in BUE therex on [employer] (random at work load 5 for 7 mins with RPM>40). Pt stated he currently owns rolling shower chair that he can use at home for shower.   Therapy Documentation Precautions:  Precautions Precautions: Fall Precaution Comments: L AKA, R BKA  Required Braces or Orthoses: Knee Immobilizer - Right Other Brace/Splint: L prosthesis for mobility Restrictions Weight Bearing Restrictions: Yes RLE Weight Bearing: Non weight bearing LLE Weight Bearing: Non weight bearing Pain:  Pt denied pain  See FIM for current functional status  Therapy/Group: Individual Therapy  Leroy Libman 05/18/2015, 8:00 AM

## 2015-05-19 ENCOUNTER — Inpatient Hospital Stay (HOSPITAL_COMMUNITY): Payer: BLUE CROSS/BLUE SHIELD | Admitting: *Deleted

## 2015-05-19 ENCOUNTER — Inpatient Hospital Stay (HOSPITAL_COMMUNITY): Payer: BLUE CROSS/BLUE SHIELD

## 2015-05-19 ENCOUNTER — Inpatient Hospital Stay (HOSPITAL_COMMUNITY): Payer: BLUE CROSS/BLUE SHIELD | Admitting: Rehabilitation

## 2015-05-19 LAB — CBC
HEMATOCRIT: 25.8 % — AB (ref 39.0–52.0)
Hemoglobin: 8.6 g/dL — ABNORMAL LOW (ref 13.0–17.0)
MCH: 29.4 pg (ref 26.0–34.0)
MCHC: 33.3 g/dL (ref 30.0–36.0)
MCV: 88.1 fL (ref 78.0–100.0)
Platelets: 342 10*3/uL (ref 150–400)
RBC: 2.93 MIL/uL — ABNORMAL LOW (ref 4.22–5.81)
RDW: 14 % (ref 11.5–15.5)
WBC: 8 10*3/uL (ref 4.0–10.5)

## 2015-05-19 LAB — GLUCOSE, CAPILLARY
GLUCOSE-CAPILLARY: 170 mg/dL — AB (ref 65–99)
GLUCOSE-CAPILLARY: 110 mg/dL — AB (ref 65–99)
GLUCOSE-CAPILLARY: 79 mg/dL (ref 65–99)
Glucose-Capillary: 172 mg/dL — ABNORMAL HIGH (ref 65–99)

## 2015-05-19 NOTE — Progress Notes (Signed)
Occupational Therapy Note  Patient Details  Name: [name] MRN: [id number] Date of Birth: [birth year]  Today's Date: 05/19/2015 OT Individual Time: 1300-1430 OT Individual Time Calculation (min): 90 min   Pt denied pain Individual Therapy  Pt seen for skilled OT therapy with focus on accessing public restrooms, w/c mobility, clothing management at w/c and/or toilet level, and BUE therex.  Pt propelled w/c to entrance C to access public restroom.  Pt required assistance with door and toilet stall door.  Engaged in education/demonstration regarding w/c positioning.  Pt recognized that he would need assistance if he needed to use public restroom.  Pt returned to therapy gym and practiced LB clothing management at w/c level before transitioning to [place] for BUE therex (Random at work load 5 for 8 min.)   Leroy Libman 05/19/2015, 2:47 PM

## 2015-05-19 NOTE — Progress Notes (Signed)
Placed patient on CPAP for the night.  Patient is tolerating well at this time. 

## 2015-05-19 NOTE — Progress Notes (Signed)
Pt already asleep when RT entered room, RT asked if pt was ready to go on CPAP for the night and pt refused. RT informed pt to call for RT if he changes his mind during the night

## 2015-05-19 NOTE — Progress Notes (Signed)
Recreational Therapy Session Note  Patient Details  Name: [name] MRN: [id number] Date of Birth: 02-12-1951 Today's Date: 05/19/2015  Pain: no c/o Skilled Therapeutic Interventions/Progress Updates: Met with pt on  7/18 to discuss TR services and identify leisure interests.  Met with pt today to discuss discharge planning including use of leisure time, role changes, and community reintegration.  Pt propelled w/c throughout hospital and outside on slightly uneven surfaces using BUE's with supervision and multiple rest breaks due to UE fatigue.  Education provided on energy conservation, pt stated understanding.  No further TR implemented as pt to discharge 7/23. [place] 05/19/2015, 3:28 PM

## 2015-05-19 NOTE — Progress Notes (Signed)
[facility] PHYSICAL MEDICINE & REHABILITATION     PROGRESS NOTE    Subjective/Complaints: Feeling well. Pain controlled. Sleeping well. Progressing with therapies  ROS: Pt denies fever, rash/itching, headache, blurred or double vision, nausea, vomiting, abdominal pain, diarrhea, chest pain, shortness of breath, palpitations, dysuria, dizziness, neck or back pain, bleeding, anxiety, or depression   Objective: Vital Signs: Blood pressure 112/68, pulse 72, temperature 98 F (36.7 C), temperature source Oral, resp. rate 18, weight 102.468 kg (225 lb 14.4 oz), SpO2 100 %. No results found.  Recent Labs  05/19/15 0524  WBC 8.0  HGB 8.6*  HCT 25.8*  PLT 342   No results for input(s): NA, K, CL, GLUCOSE, BUN, CREATININE, CALCIUM in the last 72 hours.  Invalid input(s): CO CBG (last 3)   Recent Labs  05/18/15 1631 05/18/15 2043 05/19/15 0656  GLUCAP 97 224* 170*    Wt Readings from Last 3 Encounters:  05/14/15 102.468 kg (225 lb 14.4 oz)  05/10/15 111.273 kg (245 lb 5 oz)  04/28/15 111.299 kg (245 lb 5.9 oz)    Physical Exam:  HENT:  Head: Normocephalic and atraumatic.  Mouth/Throat: Oropharynx is clear and moist. No oropharyngeal exudate.  Eyes: Conjunctivae are normal. Pupils are equal, round, and reactive to light.  Neck: Normal range of motion. Neck supple. No JVD present. No tracheal deviation present. No thyromegaly present.  Cardiovascular: Normal rate, regular rhythm and normal heart sounds. Exam reveals no friction rub.  No murmur heard. Respiratory: Effort normal and breath sounds normal. No respiratory distress. He has no wheezes. He has no rales.  GI: Soft. Bowel sounds are normal. He exhibits no distension. There is no tenderness. There is no rebound.  Musculoskeletal: He exhibits tenderness.  R-AKA  moderate s/s drainage centrally. Area clean. L-AKA well healed.  Lymphadenopathy:   He has no cervical adenopathy.  Neurological: He is alert and  oriented to person, place, and time. He displays normal reflexes. No cranial nerve deficit. He exhibits normal muscle tone. Coordination normal.  Pleasant and appropriate. Speech clear. Able to follow commands without difficulty. UE 5/5 delt,bicep, tircep, HI. RLE: 2/5 HF, 2/5 KE. LLE: 4/5 HF. Sensation grossly intact.  Skin: Skin is warm and dry.  Psychiatric: He has a normal mood and affect. His behavior is normal. Judgment and thought content normal.     Assessment/Plan: 1. Functional deficits secondary to new right BKA/ prior left AKA which require 3+ hours per day of interdisciplinary therapy in a comprehensive inpatient rehab setting. Physiatrist is providing close team supervision and 24 hour management of active medical problems listed below. Physiatrist and rehab team continue to assess barriers to discharge/monitor patient progress toward functional and medical goals. FIM: FIM - Bathing Bathing Steps Patient Completed: Chest, Right Arm, Left Arm, Abdomen, Right upper leg, Left upper leg, Front perineal area, Buttocks Bathing: 5: Supervision: Safety issues/verbal cues  FIM - Upper Body Dressing/Undressing Upper body dressing/undressing steps patient completed: Thread/unthread right sleeve of pullover shirt/dresss, Thread/unthread left sleeve of pullover shirt/dress, Put head through opening of pull over shirt/dress, Pull shirt over trunk Upper body dressing/undressing: 5: Set-up assist to: Obtain clothing/put away FIM - Lower Body Dressing/Undressing Lower body dressing/undressing steps patient completed: Thread/unthread right underwear leg, Thread/unthread left underwear leg, Pull underwear up/down, Thread/unthread right pants leg, Thread/unthread left pants leg, Pull pants up/down Lower body dressing/undressing: 5: Supervision: Safety issues/verbal cues  FIM - Toileting Toileting steps completed by patient: Adjust clothing prior to toileting, Adjust clothing after  toileting Toileting:  3: Mod-Patient completed 2 of 3 steps  FIM - Radio producer Devices: Recruitment consultant Transfers: 4-To toilet/BSC: Min A (steadying Pt. > 75%)  FIM - Bed/Chair Transfer Bed/Chair Transfer Assistive Devices: Arm rests, Bed rails Bed/Chair Transfer: 5: Supine > Sit: Supervision (verbal cues/safety issues), 5: Bed > Chair or W/C: Supervision (verbal cues/safety issues), 5: Chair or W/C > Bed: Supervision (verbal cues/safety issues), 5: Sit > Supine: Supervision (verbal cues/safety issues)  FIM - Locomotion: Wheelchair Distance: 150 Locomotion: Wheelchair: 5: Travels 150 ft or more: maneuvers on rugs and over door sills with supervision, cueing or coaxing FIM - Locomotion: Ambulation Locomotion: Ambulation: 0: Activity did not occur  Comprehension Comprehension Mode: Auditory Comprehension: 7-Follows complex conversation/direction: With no assist  Expression Expression Mode: Verbal Expression: 7-Expresses complex ideas: With no assist  Social Interaction Social Interaction: 7-Interacts appropriately with others - No medications needed.  Problem Solving Problem Solving: 7-Solves complex problems: Recognizes & self-corrects  Memory Memory: 7-Complete Independence: No helper  Medical Problem List and Plan: 1. Functional deficits (impaired mobility/self-care) secondary to s/p right BKA (hx of left AKA)-- 2. DVT Prophylaxis/Anticoagulation:  Lovenox--continue   3. Pain Management: Current regimen of percocet and robaxin remain effective  4. Mood: pt staying up beat.   5. Neuropsych: This patient is capable of making decisions on his own behalf. 6. Skin/Wound Care: Monitor wound daily.   -wound with mild to moderate S/S drainage---continue dry dressing QD to BID(if needed) 7. Fluids/Electrolytes/Nutrition: Monitor I/O. Encourage PO.  8. DM type 2: Po intake poor at this time. Recommended supplements from home tid- qid (which  he prefers). Monitor BS with ac/hs checks. Continue lantus insulin and custom SSI. -fair control in general---cover elevations with SSI for now as elevations are sporadic 9. ABLA:  Most recent count 9. Re-check tomorrow 10. HTN: Monitor bid. Continue tenormin and lasix daily.    -normotensive at present 11. OSA: CPAP at bedtime 12. CKD: CR 1.49 (below baseline CR)----no changes, same diet 13. Leukocytosis: some improvement    LOS (Days) 5 A FACE TO FACE EVALUATION WAS PERFORMED  SWARTZ,ZACHARY T 05/19/2015 8:34 AM

## 2015-05-19 NOTE — Patient Care Conference (Signed)
Inpatient RehabilitationTeam Conference and Plan of Care Update Date: 05/18/2015   Time: 2:45 PM    Patient Name: [name]      Medical Record Number: [id number]  Date of Birth: 05-29-1951 Sex: Male         Room/Bed: 4W12C/4W12C-01 Payor Info: Payor: [place] / Plan: [employer] OTHER / Product Type: *No Product type* /    Admitting Diagnosis: R BKA  old l aka  Admit Date/Time:  05/14/2015  4:55 PM Admission Comments: No comment available   Primary Diagnosis:  Status post below knee amputation of right lower extremity Principal Problem: Status post below knee amputation of right lower extremity  Patient Active Problem List   Diagnosis Date Noted  . Status post below knee amputation of right lower extremity 05/14/2015  . History of left above knee amputation 05/14/2015  . Toe amputation status 04/28/2015  . Diabetic foot infection 04/12/2015  . Pressure ulcer 04/12/2015  . OSA (obstructive sleep apnea) 02/23/2015  . Acute confusional state 08/04/2014  . Type 2 diabetes mellitus with other circulatory complications 99991111  . HLD (hyperlipidemia) 08/04/2014  . CKD (chronic kidney disease) 06/28/2014  . Transient global amnesia 06/27/2014  . TIA (transient ischemic attack) 06/27/2014  . Hypotension 11/08/2013  . OBESITY, UNSPECIFIED 08/17/2010  . SYNCOPE 08/17/2010  . CARDIOVASCULAR FUNCTION STUDY, ABNORMAL 08/17/2010  . DM 08/11/2010  . HYPERLIPIDEMIA 08/11/2010  . Essential hypertension 08/11/2010    Expected Discharge Date: Expected Discharge Date: 05/22/15  Team Members Present: Physician leading conference: Dr. Alger Simons Social Worker Present: Lennart Pall, LCSW Nurse Present: Dorien Chihuahua, RN PT Present: Jorge Mandril, PT OT Present: Roanna Epley, COTA SLP Present: Weston Anna, SLP PPS Coordinator present : Daiva Nakayama, RN, CRRN     Current Status/Progress Goal Weekly Team Focus  Medical   new right BKA, old left AKA, bp controlled. pain  improving. local care to limb  improve exercise tolerance  w/c mobility, transfers, wound care,   Bowel/Bladder   Pt contienent of bowel and bladder. uses urinal and has scheduled stool softeners  Mod I  cont. plan of care for b/b   Swallow/Nutrition/ Hydration             ADL's   bathing/dressing-supervision at bed level; transfers-min A;  bathing-mod I; UB drressing-I; LB dressing-supervision; shower transfer-min A; toilet transfers-supervision  BADLs; functional transfers; family education; discharge planning   Mobility   supervision with functional mobility at a wheelchair level   mod I with functional mobility at a wheelchair level   endurance, safety, transfers, wheelchair management/propulsion, balance.    Communication             Safety/Cognition/ Behavioral Observations            Pain   pain noted to R BKA at times. 1-2 percocet PRN q4hrs for pain  3 or less  assess pain and medicate as needed   Skin   right BKA- sutures inplace. mid incision minimal bloody drainage; BID dressing change. 4x4 kerlex ACE  free of skin breakdown and infection min assist  assess skin q shift and change dressing as scheduled.    Rehab Goals Patient on target to meet rehab goals: Yes *See Care Plan and progress notes for long and short-term goals.  Barriers to Discharge: will not be able to use left AKA---w/c goals    Possible Resolutions to Barriers:  w/c use, transfer training    Discharge Planning/Teaching Needs:  home with wife who  can provide 24/7 assistance      Team Discussion:  Doing well in therapies.  Limb still with swelling - monitor.  sloding board transfers with min assist.  Goals set for mod independent w/c.  No concerns about meeting these goals.  Pt working very hard  Revisions to Treatment Plan:  None   Continued Need for Acute Rehabilitation Level of Care: The patient requires daily medical management by a physician with specialized training in physical medicine and  rehabilitation for the following conditions: Daily direction of a multidisciplinary physical rehabilitation program to ensure safe treatment while eliciting the highest outcome that is of practical value to the patient.: Yes Daily medical management of patient stability for increased activity during participation in an intensive rehabilitation regime.: Yes Daily analysis of laboratory values and/or radiology reports with any subsequent need for medication adjustment of medical intervention for : Post surgical problems;Other  Tamicka Shimon 05/19/2015, 3:58 PM

## 2015-05-19 NOTE — Progress Notes (Signed)
Physical Therapy Session Note  Patient Details  Name: [name] MRN: [id number] Date of Birth: [birth year]  Today's Date: 05/19/2015 PT Individual Time: 0800-0900 PT Individual Time Calculation (min): 60 min   Short Term Goals: Week 1:  PT Short Term Goal 1 (Week 1): pt will perform functional transfers with supervision PT Short Term Goal 2 (Week 1): pt will propel w/c in home environement with supervision 50'  Skilled Therapeutic Interventions/Progress Updates:    Session focused on functional uneven transfers with slideboard with emphasis on practicing bed height to simulate home environment (26"), LE HEP issued for RLE strengthening and ROM including quad sets, SLR, SAQ, and sidelying hip abduction and extension x 15 reps x 2 sets each, core strengthening exercises with weighted medicine ball (red) for PNF diagonals and trunk rotation x 15 reps each direction x 2 sets, and w/c propulsion for BUE strengthening/endurance. Pt unable to perform transfer to 26" height and discussed options in the home for this with agreement that hospital bed likely being the best option to increase independence but pt's wife looking into possibility of [employer] M[employer] bed so will wait to notify CSW until they make a final decision. Discussed other various household transfers and set-up to increase independence. End of session left up in w/c with all needs in reach and wife at bedside.  Therapy Documentation Precautions:  Precautions Precautions: Fall Precaution Comments: L AKA, R BKA  Required Braces or Orthoses: Knee Immobilizer - Right Other Brace/Splint: L prosthesis for mobility Restrictions Weight Bearing Restrictions: Yes RLE Weight Bearing: Non weight bearing   Pain:  Denies pain.  See FIM for current functional status  Therapy/Group: Individual Therapy  Canary Brim Ivory Broad, PT, DPT  05/19/2015, 11:14 AM

## 2015-05-19 NOTE — Progress Notes (Signed)
Social Work Patient ID: [name], male   DOB: January 23, 1951, 64 y.o.   MRN: [id number]   Met with pt and wife yesterday to review team conference.  Both aware and agreeable with target d/c date of 7/23 and mod i w/c goals.  Pleased with progress being made.  Discussing DME needs.  Will continue to follow for d/c planning and support.  Michae Grimley, LCSW

## 2015-05-19 NOTE — Progress Notes (Signed)
Occupational Therapy Session Note  Patient Details  Name: [name] MRN: [id number] Date of Birth: [birth year]  Today's Date: 05/19/2015 OT Individual Time: 0700-0756 OT Individual Time Calculation (min): 56 min    Short Term Goals: Week 1:  OT Short Term Goal 1 (Week 1): Patient will complete toilet transfers with minimum assistance. OT Short Term Goal 2 (Week 1): Patient will complete LB dressing with moderate assistance. OT Short Term Goal 3 (Week 1): Patient will complete UB dressing with modified independence. OT Short Term Goal 4 (Week 1): Patient will complete shower transfers with minimum assistance. OT Short Term Goal 5 (Week 1): Patient will complete grooming independently at the sink.  Skilled Therapeutic Interventions/Progress Updates:    Pt engaged in BADL retraining including bathing and dressing in w/c at sink.  Pt performed lateral leans to bathe buttocks and attempted to pull up pants with limited lateral leans.  Educated patient on importance of having another surface beside w/c to lean onto to facilitate adequate lateral lean to pull up pants.  Pt's wife stated she felt like they had an extra chair to accomplish this at home.  Pt's wife also stated that bed at home was 26" and questioned if hospital bed would be needed.  Will continue to assess.  Pt completed BADLs at supervision level.  Focus on activity tolerance, lateral leans, functional transfers, discharge planning, and continued family education.  Therapy Documentation Precautions:  Precautions Precautions: Fall Precaution Comments: L AKA, R BKA  Required Braces or Orthoses: Knee Immobilizer - Right Other Brace/Splint: L prosthesis for mobility Restrictions Weight Bearing Restrictions: Yes RLE Weight Bearing: Non weight bearing LLE Weight Bearing: Non weight bearing Pain:   Pt denied pain  See FIM for current functional status  Therapy/Group: Individual Therapy  Leroy Libman 05/19/2015, 7:58  AM

## 2015-05-20 ENCOUNTER — Inpatient Hospital Stay (HOSPITAL_COMMUNITY): Payer: BLUE CROSS/BLUE SHIELD

## 2015-05-20 ENCOUNTER — Inpatient Hospital Stay (HOSPITAL_COMMUNITY): Payer: BLUE CROSS/BLUE SHIELD | Admitting: Occupational Therapy

## 2015-05-20 LAB — GLUCOSE, CAPILLARY
GLUCOSE-CAPILLARY: 93 mg/dL (ref 65–99)
Glucose-Capillary: 100 mg/dL — ABNORMAL HIGH (ref 65–99)

## 2015-05-20 LAB — CBC
HCT: 25.4 % — ABNORMAL LOW (ref 39.0–52.0)
Hemoglobin: 8.3 g/dL — ABNORMAL LOW (ref 13.0–17.0)
MCH: 28.5 pg (ref 26.0–34.0)
MCHC: 32.7 g/dL (ref 30.0–36.0)
MCV: 87.3 fL (ref 78.0–100.0)
Platelets: 380 10*3/uL (ref 150–400)
RBC: 2.91 MIL/uL — AB (ref 4.22–5.81)
RDW: 14 % (ref 11.5–15.5)
WBC: 7.6 10*3/uL (ref 4.0–10.5)

## 2015-05-20 NOTE — Progress Notes (Signed)
Physical Therapy Session Note  Patient Details  Name: [name] MRN: [id number] Date of Birth: Feb 29, 1951  Today's Date: 05/20/2015 PT Individual Time: 0800-0900 PT Individual Time Calculation (min): 60 min   Short Term Goals: Week 1:  PT Short Term Goal 1 (Week 1): pt will perform functional transfers with supervision PT Short Term Goal 2 (Week 1): pt will propel w/c in home environement with supervision 50'  Skilled Therapeutic Interventions/Progress Updates:   Session focused on d/c planning and DME needs for d/c (notifed CSW of needs for w/c with amputee axle and pad, slideboard, and hospital bed) with pt and wife, simulated car transfer using slideboard at S level with cues needed for leg rest management and removal of armrest to set-up for transfer, uneven transfers on mat to increase challenge with S for transfer and still requiring cues for w/c parts management to set-up correctly, reviewed LE HEP for functional strengthening and ROM to residual limb including quad sets, hip flexion/SLR, sidelying hip abduction and prone hip extension x 15 reps x 2 sets, w/c mobility training including up/down ramp with S x 2 reps to simulate home entry with cues for efficient technique, and furniture transfer using slideboard for household mobility training at S to steady A level due to uphill transfer. Discussed with wife at end of session to join PT session tomorrow to observe car transfer and other mobility as she has been to OT sessions only and she was in agreement. All needs in reach.  Therapy Documentation Precautions:  Precautions Precautions: Fall Precaution Comments: L AKA, R BKA  Required Braces or Orthoses: Knee Immobilizer - Right Other Brace/Splint: L prosthesis for mobility Restrictions Weight Bearing Restrictions: Yes RLE Weight Bearing: Non weight bearing LLE Weight Bearing: Non weight bearing  Pain: Pain Assessment Pain Assessment: No/denies painDenies pain.  See FIM for  current functional status  Therapy/Group: Individual Therapy  Canary Brim Ivory Broad, PT, DPT  05/20/2015, 10:36 AM

## 2015-05-20 NOTE — Progress Notes (Signed)
[facility] PHYSICAL MEDICINE & REHABILITATION     PROGRESS NOTE    Subjective/Complaints: Slept last night. Refused cpap---reports no problems breathing, pain controlled  ROS: Pt denies fever, rash/itching, headache, blurred or double vision, nausea, vomiting, abdominal pain, diarrhea, chest pain, shortness of breath, palpitations, dysuria, dizziness, neck or back pain, bleeding, anxiety, or depression   Objective: Vital Signs: Blood pressure 114/67, pulse 70, temperature 98.6 F (37 C), temperature source Oral, resp. rate 18, weight 96.172 kg (212 lb 0.3 oz), SpO2 100 %. No results found.  Recent Labs  05/19/15 0524 05/20/15 0434  WBC 8.0 7.6  HGB 8.6* 8.3*  HCT 25.8* 25.4*  PLT 342 380   No results for input(s): NA, K, CL, GLUCOSE, BUN, CREATININE, CALCIUM in the last 72 hours.  Invalid input(s): CO CBG (last 3)   Recent Labs  05/19/15 1137 05/19/15 1631 05/19/15 2053  GLUCAP 110* 79 172*    Wt Readings from Last 3 Encounters:  05/19/15 96.172 kg (212 lb 0.3 oz)  05/10/15 111.273 kg (245 lb 5 oz)  04/28/15 111.299 kg (245 lb 5.9 oz)    Physical Exam:  HENT:  Head: Normocephalic and atraumatic.  Mouth/Throat: Oropharynx is clear and moist. No oropharyngeal exudate.  Eyes: Conjunctivae are normal. Pupils are equal, round, and reactive to light.  Neck: Normal range of motion. Neck supple. No JVD present. No tracheal deviation present. No thyromegaly present.  Cardiovascular: Normal rate, regular rhythm and normal heart sounds. Exam reveals no friction rub.  No murmur heard. Respiratory: Effort normal and breath sounds normal. No respiratory distress. He has no wheezes. He has no rales.  GI: Soft. Bowel sounds are normal. He exhibits no distension. There is no tenderness. There is no rebound.  Musculoskeletal: He exhibits tenderness. Right BK still edematous R-BKA  moderate s/s drainage centrally still. Incision well approximated. A few small non-viable  areas and nearby blisters. Otherwise, Area clean. L-AKA well healed.  Lymphadenopathy:   He has no cervical adenopathy.  Neurological: He is alert and oriented to person, place, and time. He displays normal reflexes. No cranial nerve deficit. He exhibits normal muscle tone. Coordination normal.  Pleasant and appropriate. Speech clear. Able to follow commands without difficulty. UE 5/5 delt,bicep, tircep, HI. RLE: 2/5 HF, 2/5 KE. LLE: 4/5 HF. Sensation grossly intact.  Skin: Skin is warm and dry.  Psychiatric: He has a normal mood and affect. His behavior is normal. Judgment and thought content normal.     Assessment/Plan: 1. Functional deficits secondary to new right BKA/ prior left AKA which require 3+ hours per day of interdisciplinary therapy in a comprehensive inpatient rehab setting. Physiatrist is providing close team supervision and 24 hour management of active medical problems listed below. Physiatrist and rehab team continue to assess barriers to discharge/monitor patient progress toward functional and medical goals. FIM: FIM - Bathing Bathing Steps Patient Completed: Chest, Right Arm, Left Arm, Abdomen, Right upper leg, Left upper leg, Front perineal area, Buttocks Bathing: 5: Supervision: Safety issues/verbal cues  FIM - Upper Body Dressing/Undressing Upper body dressing/undressing steps patient completed: Thread/unthread right sleeve of pullover shirt/dresss, Thread/unthread left sleeve of pullover shirt/dress, Put head through opening of pull over shirt/dress, Pull shirt over trunk Upper body dressing/undressing: 5: Set-up assist to: Obtain clothing/put away FIM - Lower Body Dressing/Undressing Lower body dressing/undressing steps patient completed: Thread/unthread right underwear leg, Thread/unthread left underwear leg, Pull underwear up/down, Thread/unthread right pants leg, Thread/unthread left pants leg, Pull pants up/down Lower body dressing/undressing: 5: Supervision:  Safety issues/verbal cues  FIM - Toileting Toileting steps completed by patient: Adjust clothing prior to toileting, Adjust clothing after toileting Toileting: 3: Mod-Patient completed 2 of 3 steps  FIM - Radio producer Devices: Bedside commode Toilet Transfers: 4-To toilet/BSC: Min A (steadying Pt. > 75%)  FIM - Bed/Chair Transfer Bed/Chair Transfer Assistive Devices: Arm rests, Bed rails Bed/Chair Transfer: 5: Supine > Sit: Supervision (verbal cues/safety issues), 5: Sit > Supine: Supervision (verbal cues/safety issues), 5: Bed > Chair or W/C: Supervision (verbal cues/safety issues), 4: Chair or W/C > Bed: Min A (steadying Pt. > 75%)  FIM - Locomotion: Wheelchair Distance: 150 Locomotion: Wheelchair: 5: Travels 150 ft or more: maneuvers on rugs and over door sills with supervision, cueing or coaxing FIM - Locomotion: Ambulation Locomotion: Ambulation: 0: Activity did not occur  Comprehension Comprehension Mode: Auditory Comprehension: 7-Follows complex conversation/direction: With no assist  Expression Expression Mode: Verbal Expression: 7-Expresses complex ideas: With no assist  Social Interaction Social Interaction: 7-Interacts appropriately with others - No medications needed.  Problem Solving Problem Solving: 7-Solves complex problems: Recognizes & self-corrects  Memory Memory: 7-Complete Independence: No helper  Medical Problem List and Plan: 1. Functional deficits (impaired mobility/self-care) secondary to s/p right BKA (hx of left AKA)-- 2. DVT Prophylaxis/Anticoagulation:  Lovenox--continue   3. Pain Management: Current regimen of percocet and robaxin remain effective  4. Mood: pt staying up beat.   5. Neuropsych: This patient is capable of making decisions on his own behalf. 6. Skin/Wound Care: Monitor wound daily.   -wound with mild to moderate S/S drainage-centrally--change qd to bid  -will go home with ACE---shrinker later 7.  Fluids/Electrolytes/Nutrition: Monitor I/O. Encourage PO.  8. DM type 2: Po intake poor at this time. Recommended supplements from home tid- qid (which he prefers). Monitor BS with ac/hs checks. Continue lantus insulin and custom SSI. -fair control in general---cover elevations with SSI for now as elevations are sporadic 9. ABLA:  hgb down to 8.3---expected loss---follow up hgb on Monday or Tuesday with HH 10. HTN: Monitor bid. Continue tenormin and lasix daily.    -normotensive at present 11. OSA: CPAP at bedtime 12. CKD: CR 1.49 (below baseline CR)----no changes, same diet 13. Leukocytosis: some improvement    LOS (Days) 6 A FACE TO FACE EVALUATION WAS PERFORMED  SWARTZ,ZACHARY T 05/20/2015 8:35 AM

## 2015-05-20 NOTE — Progress Notes (Signed)
Occupational Therapy Session Note  Patient Details  Name: [name] MRN: [id number] Date of Birth: June 22, 1951  Today's Date: 05/20/2015 OT Individual Time: 0700-0800 OT Individual Time Calculation (min): 60 min    Short Term Goals: Week 1:  OT Short Term Goal 1 (Week 1): Patient will complete toilet transfers with minimum assistance. OT Short Term Goal 2 (Week 1): Patient will complete LB dressing with moderate assistance. OT Short Term Goal 3 (Week 1): Patient will complete UB dressing with modified independence. OT Short Term Goal 4 (Week 1): Patient will complete shower transfers with minimum assistance. OT Short Term Goal 5 (Week 1): Patient will complete grooming independently at the sink.  Skilled Therapeutic Interventions/Progress Updates:    Pt engaged in BADL retraining including bathing and dressing tasks at bed level.  Pt stated that he would perform these tasks at bed level at home until such time as he is able to get in shower.  Pt owns rolling shower chair that he will perform A/P transfer.  Pt completes all bathing and dressing tasks at supervision level with more than a reasonable amount of time.  Pt performed sliding board transfer to w/c with supervision to complete grooming tasks at sink.  Educated patient and wife on ace wrapping of right residual limb.  Focus on activity tolerance, bed mobility, transfers, w/c setup, family education, and safety awareness.  Therapy Documentation Precautions:  Precautions Precautions: Fall Precaution Comments: L AKA, R BKA  Required Braces or Orthoses: Knee Immobilizer - Right Other Brace/Splint: L prosthesis for mobility Restrictions Weight Bearing Restrictions: Yes RLE Weight Bearing: Non weight bearing LLE Weight Bearing: Non weight bearing Pain: Pain Assessment Pain Assessment: No/denies pain  See FIM for current functional status  Therapy/Group: Individual Therapy  Leroy Libman 05/20/2015, 8:58 AM

## 2015-05-20 NOTE — Progress Notes (Signed)
Occupational Therapy Note  Patient Details  Name: [name] MRN: [id number] Date of Birth: 1950-11-05  Today's Date: 05/20/2015 OT Individual Time: 1100-1200 OT Individual Time Calculation (min): 60 min   Pt denied pain Individual Therapy  Pt initially engaged in practicing w/c<>bed transfers including w/c setup and sliding board placement.  Pt practiced transfers X 6 from both sides of bed.  Pt performs transfers with supervision but continues to require min verbal cues for w/c setup/sequencing. Pt transitioned to BUE therex on [employer] (random at workload 6 for 8 mins with rest break). Focus on transfers, safety awareness, and activity tolerance.    [name] [place] 05/20/2015, 12:07 PM

## 2015-05-20 NOTE — Progress Notes (Signed)
Pt not ready for cpap at this time, stated if he wears cpap tonight he would be ready around 2300 tonight. RT will check back on pt at that time

## 2015-05-21 ENCOUNTER — Inpatient Hospital Stay (HOSPITAL_COMMUNITY): Payer: BLUE CROSS/BLUE SHIELD

## 2015-05-21 ENCOUNTER — Inpatient Hospital Stay (HOSPITAL_COMMUNITY): Payer: BLUE CROSS/BLUE SHIELD | Admitting: Occupational Therapy

## 2015-05-21 LAB — GLUCOSE, CAPILLARY
GLUCOSE-CAPILLARY: 104 mg/dL — AB (ref 65–99)
GLUCOSE-CAPILLARY: 76 mg/dL (ref 65–99)
GLUCOSE-CAPILLARY: 126 mg/dL — AB (ref 65–99)
Glucose-Capillary: 163 mg/dL — ABNORMAL HIGH (ref 65–99)
Glucose-Capillary: 123 mg/dL — ABNORMAL HIGH (ref 65–99)

## 2015-05-21 MED ORDER — POLYETHYLENE GLYCOL 3350 17 G PO PACK: 17.0000 g | PACK | Freq: Every day | ORAL | Status: DC

## 2015-05-21 MED ORDER — DOCUSATE SODIUM 100 MG PO CAPS: 100.0000 mg | ORAL_CAPSULE | Freq: Two times a day (BID) | ORAL | Status: DC

## 2015-05-21 MED ORDER — OXYCODONE-ACETAMINOPHEN 10-325 MG PO TABS: 1.0000 | ORAL_TABLET | Freq: Four times a day (QID) | ORAL | Status: DC | PRN

## 2015-05-21 MED ORDER — ACETAMINOPHEN 325 MG PO TABS: 325.0000 mg | ORAL_TABLET | ORAL | Status: DC | PRN

## 2015-05-21 NOTE — Progress Notes (Signed)
Physical Therapy Discharge Summary  Patient Details  Name: [name] MRN: [id number] Date of Birth: January 04, 1951  Today's Date: 05/21/2015 PT Individual Time: 0800-0900 PT Individual Time Calculation (min): 60 min  Denies pain. Session focused on family education and completing grad day activities including bed mobility, transfers with emphasis on set-up/w/c parts management and positioning of slideboard, car transfer training, furniture transfers to simulate pt's recliner at home, and education on importance of continued HEP as well as safe guarding during mobility. Pt able to perform 1 transfer at mod I level but all others required cues for set-up and positioning to increase safety and educated wife on this as well. Had pt and wife return demonstrate car and furniture transfers with initial cues by therapist and then able to return demonstrate safely. Recommended for car transfer to set up closer to an A/P versus lateral transfer due to limited space and demonstrated various options to increase safety. Pt and wife state that they feel prepared for d/c and deny further questions.     Patient has met 4 of 6 long term goals due to improved activity tolerance, improved balance, increased strength and ability to compensate for deficits.  Patient to discharge at a wheelchair level Supervision.   Patient's wife  is independent to provide the necessary supervision assistance at discharge.  Reasons goals not met: 1. Basic transfer goal set at mod I but pt requires S due to cues needed for positioning and set-up of w/c parts and w/c. 2. Community w/c mobility goal set at mod I but pt requires S due to cues for safety with propulsion over uneven terrain, etc.  Recommendation:  Patient will benefit from ongoing skilled PT services in home health setting to continue to advance safe functional mobility, address ongoing impairments in strength, ROM, endurance, functional mobility, w/c mobility, and minimize fall  risk.  Equipment: 20x18 w/c with amputee axle, basic cushion and R amputee pad, 30" slideboard  Reasons for discharge: treatment goals met and discharge from hospital  Patient/family agrees with progress made and goals achieved: Yes  PT Discharge Precautions/Restrictions Precautions Precautions: Fall Precaution Comments: L AKA, R BKA  Required Braces or Orthoses: Knee Immobilizer - Right Restrictions Weight Bearing Restrictions: Yes RLE Weight Bearing: Non weight bearing Pain Pain Assessment Pain Assessment: No/denies pain Cognition Overall Cognitive Status: Within Functional Limits for tasks assessed Arousal/Alertness: Awake/alert Orientation Level: Oriented X4 Attention: Alternating Alternating Attention: Appears intact Memory: Appears intact Awareness: Appears intact Problem Solving: Appears intact Safety/Judgment: Appears intact Sensation Sensation Light Touch: Appears Intact Stereognosis: Not tested Hot/Cold: Appears Intact Proprioception: Appears Intact Coordination Gross Motor Movements are Fluid and Coordinated: Yes Fine Motor Movements are Fluid and Coordinated: Yes Motor  Motor Motor: Within Functional Limits  Trunk/Postural Assessment  Cervical Assessment Cervical Assessment: Within Functional Limits Thoracic Assessment Thoracic Assessment: Within Functional Limits Lumbar Assessment Lumbar Assessment: Within Functional Limits  Balance Balance Balance Assessed: Yes Static Sitting Balance Static Sitting - Level of Assistance: 6: Modified independent (Device/Increase time) Dynamic Sitting Balance Dynamic Sitting - Level of Assistance: 6: Modified independent (Device/Increase time) Extremity Assessment      RLE Assessment RLE Assessment: Exceptions to WFL (R BKA; 3+/5 grossly) LLE Assessment LLE Assessment: Exceptions to [facility] (AKA; hip 4/5)  See FIM for current functional status  Canary Brim Ivory Broad, PT, DPT  05/21/2015, 3:54  PM

## 2015-05-21 NOTE — Progress Notes (Signed)
Occupational Therapy Session Note  Patient Details  Name: [name] MRN: [id number] Date of Birth: 08-09-1951  Today's Date: 05/21/2015 OT Individual Time: 1330-1400 OT Individual Time Calculation (min): 30 min    Short Term Goals: Week 1:  OT Short Term Goal 1 (Week 1): Patient will complete toilet transfers with minimum assistance. OT Short Term Goal 2 (Week 1): Patient will complete LB dressing with moderate assistance. OT Short Term Goal 3 (Week 1): Patient will complete UB dressing with modified independence. OT Short Term Goal 4 (Week 1): Patient will complete shower transfers with minimum assistance. OT Short Term Goal 5 (Week 1): Patient will complete grooming independently at the sink.  Skilled Therapeutic Interventions/Progress Updates:    1:1 Simple meal prep in ktichen with focus on management of w/c in small kitchen space, obtain items from different height surfaces, functional problem solving for transporting items in kitchen, etc.   Therapy Documentation Precautions:  Precautions Precautions: Fall Precaution Comments: L AKA, R BKA  Required Braces or Orthoses: Knee Immobilizer - Right Other Brace/Splint: L prosthesis for mobility Restrictions Weight Bearing Restrictions: Yes RLE Weight Bearing: Non weight bearing LLE Weight Bearing: Non weight bearing Pain: Pain Assessment Pain Assessment: No/denies pain Pain Score: 0-No pain  See FIM for current functional status  Therapy/Group: Individual Therapy  Willeen Cass [facility] 05/21/2015, 2:52 PM

## 2015-05-21 NOTE — Progress Notes (Signed)
Patient has refused his CPAP tonight. RT explained importance of wearing it. Rt will continue to monitor as needed.

## 2015-05-21 NOTE — Progress Notes (Signed)
Occupational Therapy Session Note  Patient Details  Name: [name] MRN: [id number] Date of Birth: 1951-09-12  Today's Date: 05/21/2015 OT Individual Time: 0700-0800 OT Individual Time Calculation (min): 60 min    Short Term Goals: Week 1:  OT Short Term Goal 1 (Week 1): Patient will complete toilet transfers with minimum assistance. OT Short Term Goal 2 (Week 1): Patient will complete LB dressing with moderate assistance. OT Short Term Goal 3 (Week 1): Patient will complete UB dressing with modified independence. OT Short Term Goal 4 (Week 1): Patient will complete shower transfers with minimum assistance. OT Short Term Goal 5 (Week 1): Patient will complete grooming independently at the sink.  Skilled Therapeutic Interventions/Progress Updates:    Pt engaged in BADL retraining including bathing and dressing EOB and in w/c, toilet transfers and toileting, and w/c mobility in room.  Pt's wife provided appropriate level of supervision/set up throughout session.  Pt's wife demonstrated proficiency in applying [place] on pt's RLE.  Pt demonstrated proficiency in simulated shower transfer requiring supervision only.  Pt and wife pleased with progress and ready for discharge tomorrow.  Focus on activity tolerance, bed mobility, functional transfers, BADL retraining, continued family education, discharge planning, and safety awareness.  Therapy Documentation Precautions:  Precautions Precautions: Fall Precaution Comments: L AKA, R BKA  Required Braces or Orthoses: Knee Immobilizer - Right Other Brace/Splint: L prosthesis for mobility Restrictions Weight Bearing Restrictions: Yes RLE Weight Bearing: Non weight bearing LLE Weight Bearing: Non weight bearing Pain: Pain Assessment Pain Assessment: No/denies pain  See FIM for current functional status  Therapy/Group: Individual Therapy  Leroy Libman 05/21/2015, 8:01 AM

## 2015-05-21 NOTE — Plan of Care (Signed)
Problem: RH Bed to Chair Transfers Goal: LTG Patient will perform bed/chair transfers w/assist (PT) LTG: Patient will perform bed/chair transfers with assistance, with/without cues (PT).  Outcome: Adequate for Discharge Requires S due to cues  Problem: RH Wheelchair Mobility Goal: LTG Patient will propel w/c in community environment (PT) LTG: Patient will propel wheelchair in community environment, # of feet with assist (PT)  Outcome: Adequate for Discharge Requires S

## 2015-05-21 NOTE — Progress Notes (Signed)
Occupational Therapy Note  Patient Details  Name: [name] MRN: [id number] Date of Birth: 12-Apr-1951  Today's Date: 05/21/2015 OT Individual Time: 1100-1200 OT Individual Time Calculation (min): 60 min   Pt denied pain Individual Therapy  Pt engaged in w/c mobility in community setting, propelling to 1st floor and out into the small courtyard by entrance C.  Pt required assistance with opening doors and supervision when propelling down inclines.  Pt required multiple rest breaks during activity.  Pt transitioned to sitting EOM and performing BUE therex with 4# weighted ball.  Pt performed w/c<>mat transfers with supervision after setup.  Pt continues to exhibit difficulty with sequencing w/c setup in preparation for transfers.  Pt and wife stated they are pleased with progress and ready for discharge home tomorrow.   [name] [facility] 05/21/2015, 12:15 PM

## 2015-05-21 NOTE — Discharge Summary (Signed)
Physician Discharge Summary  Patient ID: [name] MRN: [id number] DOB/AGE: 11/29/1950 64 y.o.  Admit date: 05/14/2015 Discharge date: 05/22/2015  Discharge Diagnoses:  Principal Problem:   Status post below knee amputation of right lower extremity Active Problems:   Essential hypertension   CKD (chronic kidney disease)   Type 2 diabetes mellitus with other circulatory complications   History of left above knee amputation   Discharged Condition: stable  Significant Diagnostic Studies: Dg Chest 2 View  04/28/2015   CLINICAL DATA:  Preoperative evaluation for toe amputation. History of sleep apnea and hypertension  EXAM: CHEST  2 VIEW  COMPARISON:  April 12, 2015  FINDINGS: There is slight scarring in each lung base. There is no edema or consolidation. The heart size and pulmonary vascularity are within normal limits. No adenopathy. No bone lesions.  IMPRESSION: Slight scarring in the bases.  No edema or consolidation.   Electronically Signed   By: Lowella Grip III M.D.   On: 04/28/2015 15:39    Labs:  Basic Metabolic Panel:  Recent Labs Lab 05/15/15 0500  NA 139  K 4.3  CL 106  CO2 25  GLUCOSE 130*  BUN 21*  CREATININE 1.49*  CALCIUM 8.3*    CBC:  Recent Labs Lab 05/15/15 0500 05/19/15 0524 05/20/15 0434  WBC 10.6* 8.0 7.6  NEUTROABS 6.9  --   --   HGB 9.0* 8.6* 8.3*  HCT 27.0* 25.8* 25.4*  MCV 87.7 88.1 87.3  PLT 284 342 380    CBG:  Recent Labs Lab 05/20/15 1204 05/20/15 1632 05/20/15 2054 05/21/15 0702 05/21/15 1155  GLUCAP 100* 93 163* 104* 123*    Brief HPI:   [name] is a 64 y.o. 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 He was admitted on 05/10/15 for R-BKA by Dr. Lorin Mercy. Post op pain improving but continues to have some phantom pain. Noted to have leucocytosis and ABLA is stable. Therapy ongoing and CIR was recommended for  follow up therapy.    Hospital Course: [name] was admitted to rehab 05/14/2015 for inpatient therapies to consist of PT and OT at least three hours five days a week. Past admission physiatrist, therapy team and rehab RN have worked together to provide customized collaborative inpatient rehab. Pain has been well controlled with use of oxycodone on prn basis.  Follow up CBC showed that leucocytosis is resolving and no fevers noted during his rehab stay. Follow up lytes reveal that renal status is stable with downward trend in creatinine. Wound has been monitored daily and has developed serosanguinous drainage medially with blistering on distal end. No erythema or signs of infection noted.  He is to continue with daily compressive dressing changes to help with edema.  Old L-AKA is well healed.  He has made steady progress during his rehab stay and is modified independent to supervision level at wheelchair level He will continue to receive follow up [place] [facility] [place] by  [facility].    Rehab course: During patient's stay in rehab weekly team conferences were held to monitor patient's progress, set goals and discuss barriers to discharge. At admission, patient required moderate assistance with ADL tasks and mobility. He has had improvement in activity tolerance, balance, postural control, as well as ability to compensate for deficits. He is able to complete BADL with supervision needed for bathing and LB dressing.  He requires supervision with cues for sliding board transfers and is able to propel wheelchair independently.  Family education was done with wife regarding all aspects of care as well as HEP.     Disposition:  Home   Diet: Diabetic   Special Instructions: 1. Cleanse wound with soap and water. Pat dry and apply dry compressive dressing.      Medication List    STOP taking these medications        benzonatate 200 MG capsule  Commonly known as:  TESSALON      TAKE these  medications        acetaminophen 325 MG tablet  Commonly known as:  TYLENOL  Take 1-2 tablets (325-650 mg total) by mouth every 4 (four) hours as needed for mild pain.     atenolol 25 MG tablet  Commonly known as:  TENORMIN  Take 1 tablet (25 mg total) by mouth daily.     CALCIUM PLUS VITAMIN D3 600-500 MG-UNIT Caps  Generic drug:  Calcium Carb-Cholecalciferol  Take 1 capsule by mouth daily.     cilostazol 100 MG tablet  Commonly known as:  PLETAL  Take 1 tablet (100 mg total) by mouth 2 (two) times daily.     clopidogrel 75 MG tablet  Commonly known as:  PLAVIX  Take 1 tablet (75 mg total) by mouth daily.     [employer] CHEST CONGESTION RELIEF DM 20-400 MG Tabs  Generic drug:  Dextromethorphan-Guaifenesin  Take 1 tablet by mouth every 4 (four) hours as needed (cough/congestion).     docusate sodium 100 MG capsule  Commonly known as:  COLACE  Take 1 capsule (100 mg total) by mouth 2 (two) times daily.     exenatide 10 MCG/0.04ML Sopn injection  Commonly known as:  BYETTA  Inject 10 mcg into the skin 2 (two) times daily with a meal.     furosemide 40 MG tablet  Commonly known as:  LASIX  Take 1 tablet (40 mg total) by mouth daily.     LANTUS SOLOSTAR 100 UNIT/ML Solostar Pen  Generic drug:  Insulin Glargine  Inject 34 Units into the skin at bedtime.     methocarbamol 750 MG tablet  Commonly known as:  ROBAXIN-750  Take 1 tablet (750 mg total) by mouth every 8 (eight) hours as needed for muscle spasms.     NOVOLOG FLEXPEN 100 UNIT/ML FlexPen  Generic drug:  insulin aspart  Inject 8-10 Units into the skin 3 (three) times daily with meals. CBG <150 8 units, 150-200 9 units, 200-250 10 units     oxyCODONE-acetaminophen 10-325 MG per tablet  Commonly known as:  PERCOCET  Take 1 tablet by mouth every 6 (six) hours as needed for pain.     polyethylene glycol packet  Commonly known as:  MIRALAX / GLYCOLAX  Take 17 g by mouth daily.     rosuvastatin 20 MG tablet  Commonly  known as:  CRESTOR  Take 1 tablet (20 mg total) by mouth daily.           Follow-up Information    Follow up with Wardell Honour, MD On 05/26/2015.   Specialty:  Family Medicine   Why:  @ 1:00 pm   Contact information:   [place] [address] [phone]       Follow up with Meredith Staggers, MD On 07/19/2015.   Specialty:  Physical Medicine and Rehabilitation   Why:  [place] at  11 am  for  11:20 am appointment   Contact information:   [address], [address] [place] [address] [phone]       Follow up with Marybelle Killings, MD.   Specialty:  Orthopedic Surgery   Why:  Appointment in [facility] 4th at 9:20 am   Contact information:   [place] [address] [phone]       Signed: Bary Leriche 05/21/2015, 4:46 PM

## 2015-05-21 NOTE — Discharge Instructions (Signed)
Inpatient Rehab Discharge Instructions  [name] Discharge date and time: 05/22/15   Activities/Precautions/ Functional Status: Activity: activity as tolerated Diet: diabetic diet Wound Care:  Cleanse with soap and water. Pat dry and apply dry compressive dressing.   Functional status:  ___ No restrictions     ___ Walk up steps independently ___ 24/7 supervision/assistance   ___ Walk up steps with assistance _X__ Intermittent supervision/assistance  ___ Bathe/dress independently ___ Walk with walker     __X_ Bathe/dress with assistance ___ Walk Independently    ___ Shower independently ___ Walk with assistance    ___ Shower with assistance _X__ No alcohol     ___ Return to work/school ________    COMMUNITY REFERRALS UPON DISCHARGE:    Home Health:   PT       RN                     Agency: [facility] Phone: [phone]   Medical Equipment/Items Ordered: hospital bed, drop arm commode, transfer board and SWITCH OUT of current wheelchair                                                            Agency/Supplier:  [facility] [facility] PATIENT/FAMILY:  Support Groups: Amputee Group          Special Instructions:    My questions have been answered and I understand these instructions. I will adhere to these goals and the provided educational materials after my discharge from the hospital.  Patient/Caregiver Signature _______________________________ Date __________  Clinician Signature _______________________________________ Date __________  Please bring this form and your medication list with you to all your follow-up doctor's appointments.

## 2015-05-21 NOTE — Progress Notes (Signed)
Occupational Therapy Discharge Summary  Patient Details  Name: [name] MRN: [id number] Date of Birth: Jan 06, 1951  Patient has met 8 of 8 long term goals due to improved activity tolerance, improved balance and ability to compensate for deficits. Pt made steady progress with BADLs and IADLs during this admission.  Pt completes BADLs with supervision for bathing tasks and LB dressing tasks.  Pt performs toilet transfers, toileting, and shower transfers with supervision.  Pt performes UB dressing with I. Pt continues to require min verbal cues for w/c setup prior to sliding board transfers with supervision.  Pt's wife has been present and provides appropriate level of supervision and assistance.  Patient to discharge at overall Supervision level.  Patient's care partner is independent to provide the necessary physical assistance at discharge.      Recommendation:  Patient will benefit from ongoing skilled OT services in home health setting to continue to advance functional skills in the area of BADL and Reduce care partner burden.  Equipment: Drop arm BSC  Reasons for discharge: treatment goals met and discharge from hospital  Patient/family agrees with progress made and goals achieved: Yes  OT Discharge   Vision/Perception  Vision- History Baseline Vision/History: No visual deficits Vision- Assessment Vision Assessment?: No apparent visual deficits  Cognition Overall Cognitive Status: Within Functional Limits for tasks assessed Arousal/Alertness: Awake/alert Orientation Level: Oriented X4 Attention: Alternating Alternating Attention: Appears intact Memory: Appears intact Awareness: Appears intact Problem Solving: Appears intact Safety/Judgment: Appears intact Sensation Sensation Light Touch: Appears Intact Stereognosis: Not tested Hot/Cold: Appears Intact Proprioception: Appears Intact Coordination Gross Motor Movements are Fluid and Coordinated: Yes Fine Motor Movements  are Fluid and Coordinated: Yes Motor  Motor Motor: Within Functional Limits Trunk/Postural Assessment  Cervical Assessment Cervical Assessment: Within Functional Limits Thoracic Assessment Thoracic Assessment: Within Functional Limits Lumbar Assessment Lumbar Assessment: Within Functional Limits  Balance Balance Balance Assessed: Yes Static Sitting Balance Static Sitting - Level of Assistance: 6: Modified independent (Device/Increase time) Dynamic Sitting Balance Dynamic Sitting - Level of Assistance: 6: Modified independent (Device/Increase time) Extremity/Trunk Assessment RUE Assessment RUE Assessment: Within Functional Limits LUE Assessment LUE Assessment: Within Functional Limits  See FIM for current functional status  Leroy Libman 05/21/2015, 12:20 PM

## 2015-05-21 NOTE — Progress Notes (Signed)
Occupational Therapy Session Note  Patient Details  Name: [name] MRN: [id number] Date of Birth: 07-05-1951  Today's Date: 05/21/2015 late entry for 05/20/2015 OT Individual Time:  -  54:30-15:00 (25min)     Skilled Therapeutic Interventions/Progress Updates:   1:1 Focus on core strengthening and continued transfer training. Pt transferred from w/c<>mat with SB with setup with mod VC for sequence. Once on mat focus on core with lateral weight shifts without UE support, sit ups (from reclined position on wedge) while performing push ups with 5 lb weighted bar,  And then performing PNF patterns with weighted ball while performing sit ups. Pt able to complete all tasks with rest breaks as needed. Performed pushup with blocks on EOM. Returned to room   Therapy Documentation Precautions:  Precautions Precautions: Fall Precaution Comments: L AKA, R BKA  Required Braces or Orthoses: Knee Immobilizer - Right Other Brace/Splint: L prosthesis for mobility Restrictions Weight Bearing Restrictions: Yes RLE Weight Bearing: Non weight bearing LLE Weight Bearing: Non weight bearing General:   Vital Signs: Therapy Vitals Temp: 98.6 F (37 C) Temp Source: Oral Pulse Rate: 76 Resp: 18 BP: 108/64 mmHg Patient Position (if appropriate): Lying Oxygen Therapy SpO2: 99 % O2 Device: Not Delivered Pain:  no c/o pain in session   See FIM for current functional status  Therapy/Group: Individual Therapy  Willeen Cass [facility] 05/21/2015, 6:29 AM

## 2015-05-21 NOTE — Progress Notes (Signed)
PHYSICAL MEDICINE & REHABILITATION     PROGRESS NOTE    Subjective/Complaints: No complaints. Excited to be going home. Pain under control. Doing well with transfers  ROS: Pt denies fever, rash/itching, headache, blurred or double vision, nausea, vomiting, abdominal pain, diarrhea, chest pain, shortness of breath, palpitations, dysuria, dizziness, neck or back pain, bleeding, anxiety, or depression   Objective: Vital Signs: Blood pressure 108/64, pulse 76, temperature 98.6 F (37 C), temperature source Oral, resp. rate 18, weight 96.172 kg (212 lb 0.3 oz), SpO2 99 %. No results found.  Recent Labs  05/19/15 0524 05/20/15 0434  WBC 8.0 7.6  HGB 8.6* 8.3*  HCT 25.8* 25.4*  PLT 342 380   No results for input(s): NA, K, CL, GLUCOSE, BUN, CREATININE, CALCIUM in the last 72 hours.  Invalid input(s): CO CBG (last 3)   Recent Labs  05/20/15 1632 05/20/15 2054 05/21/15 0702  GLUCAP 93 163* 104*    Wt Readings from Last 3 Encounters:  05/19/15 96.172 kg (212 lb 0.3 oz)  05/10/15 111.273 kg (245 lb 5 oz)  04/28/15 111.299 kg (245 lb 5.9 oz)    Physical Exam:  HENT:  Head: Normocephalic and atraumatic.  Mouth/Throat: Oropharynx is clear and moist. No oropharyngeal exudate.  Eyes: Conjunctivae are normal. Pupils are equal, round, and reactive to light.  Neck: Normal range of motion. Neck supple. No JVD present. No tracheal deviation present. No thyromegaly present.  Cardiovascular: Normal rate, regular rhythm and normal heart sounds. Exam reveals no friction rub.  No murmur heard. Respiratory: Effort normal and breath sounds normal. No respiratory distress. He has no wheezes. He has no rales.  GI: Soft. Bowel sounds are normal. He exhibits no distension. There is no tenderness. There is no rebound.  Musculoskeletal: He exhibits tenderness. Right BK still edematous R-BKA with central, sanginous drainage.  Incision well approximated.  A few small non-viable  areas and nearby blisters. Otherwise, Area clean. L-AKA well healed.  Lymphadenopathy:   He has no cervical adenopathy.  Neurological: He is alert and oriented to person, place, and time. He displays normal reflexes. No cranial nerve deficit. He exhibits normal muscle tone. Coordination normal.  Pleasant and appropriate. Speech clear. Able to follow commands without difficulty. UE 5/5 delt,bicep, tircep, HI. RLE: 2/5 HF, 2/5 KE. LLE: 4/5 HF. Sensation grossly intact.  Skin: Skin is warm and dry.  Psychiatric: He has a normal mood and affect. His behavior is normal. Judgment and thought content normal.     Assessment/Plan: 1. Functional deficits secondary to new right BKA/ prior left AKA which require 3+ hours per day of interdisciplinary therapy in a comprehensive inpatient rehab setting. Physiatrist is providing close team supervision and 24 hour management of active medical problems listed below. Physiatrist and rehab team continue to assess barriers to discharge/monitor patient progress toward functional and medical goals. FIM: FIM - Bathing Bathing Steps Patient Completed: Chest, Right Arm, Left Arm, Abdomen, Right upper leg, Left upper leg, Front perineal area, Buttocks Bathing: 5: Supervision: Safety issues/verbal cues  FIM - Upper Body Dressing/Undressing Upper body dressing/undressing steps patient completed: Thread/unthread right sleeve of pullover shirt/dresss, Thread/unthread left sleeve of pullover shirt/dress, Put head through opening of pull over shirt/dress, Pull shirt over trunk Upper body dressing/undressing: 5: Set-up assist to: Obtain clothing/put away FIM - Lower Body Dressing/Undressing Lower body dressing/undressing steps patient completed: Thread/unthread right underwear leg, Thread/unthread left underwear leg, Pull underwear up/down, Thread/unthread right pants leg, Thread/unthread left pants leg, Pull pants up/down Lower  body dressing/undressing: 5: Supervision:  Safety issues/verbal cues  FIM - Toileting Toileting steps completed by patient: Adjust clothing prior to toileting, Adjust clothing after toileting Toileting: 3: Mod-Patient completed 2 of 3 steps  FIM - Radio producer Devices: Bedside commode Toilet Transfers: 4-To toilet/BSC: Min A (steadying Pt. > 75%)  FIM - Bed/Chair Transfer Bed/Chair Transfer Assistive Devices: Sliding board, Arm rests Bed/Chair Transfer: 6: Supine > Sit: No assist, 6: Sit > Supine: No assist, 5: Bed > Chair or W/C: Supervision (verbal cues/safety issues), 5: Chair or W/C > Bed: Supervision (verbal cues/safety issues)  FIM - Locomotion: Wheelchair Distance: 150 Locomotion: Wheelchair: 6: Travels 150 ft or more, turns around, maneuvers to table, bed or toilet, negotiates 3% grade: maneuvers on rugs and over door sills independently FIM - Locomotion: Ambulation Locomotion: Ambulation: 0: Activity did not occur  Comprehension Comprehension Mode: Auditory Comprehension: 7-Follows complex conversation/direction: With no assist  Expression Expression Mode: Verbal Expression: 7-Expresses complex ideas: With no assist  Social Interaction Social Interaction: 7-Interacts appropriately with others - No medications needed.  Problem Solving Problem Solving: 7-Solves complex problems: Recognizes & self-corrects  Memory Memory: 7-Complete Independence: No helper  Medical Problem List and Plan: 1. Functional deficits (impaired mobility/self-care) secondary to s/p right BKA (hx of left AKA)-- 2. DVT Prophylaxis/Anticoagulation:  Lovenox--continue   3. Pain Management: Current regimen of percocet and robaxin remain effective  4. Mood: pt staying up beat.   5. Neuropsych: This patient is capable of making decisions on his own behalf. 6. Skin/Wound Care: Monitor wound daily.   -AK dressing qd to bid  -will go home with ACE---shrinker later once drainage stops 7.  Fluids/Electrolytes/Nutrition: Monitor I/O. Encourage PO.  8. DM type 2: Po intake poor at this time. Recommended supplements from home tid- qid (which he prefers). Monitor BS with ac/hs checks. Continue lantus insulin and custom SSI. -fair control in general---cover elevations with SSI for now as elevations are sporadic 9. ABLA:  hgb down to 8.3--follow up hgb on Monday or Tuesday with HH 10. HTN: Monitor bid. Continue tenormin and lasix daily.    -normotensive at present 11. OSA: CPAP at bedtime 12. CKD: CR 1.49 (below baseline CR)----no changes, same diet 13. Leukocytosis: some improvement    LOS (Days) 7 A FACE TO FACE EVALUATION WAS PERFORMED  SWARTZ,ZACHARY T 05/21/2015 8:17 AM

## 2015-05-22 LAB — GLUCOSE, CAPILLARY: Glucose-Capillary: 102 mg/dL — ABNORMAL HIGH (ref 65–99)

## 2015-05-22 NOTE — Progress Notes (Signed)
Social Work  Discharge Note  The overall goal for the admission was met for:   Discharge location: Yes - home with wife as primary support  Length of Stay: Yes - 8 days  Discharge activity level: Yes - supervision/ mod independent  Home/community participation: Yes  Services provided included: MD, RD, PT, OT, RN, TR, Pharmacy and SW  Financial Services: Private Insurance: [employer]  Follow-up services arranged: Home Health: RN, PT via [place], DME: semi-electric hospital bed, 30" transfer board, drop arm commode and "switch out" of current w/c to a 20x16 ltwt. with amputee axle plate and right amp support pad all via [place], Other: provided info on Amputee Support Group and Patient/Family has no preference for HH/DME agencies  Comments (or additional information):  Patient/Family verbalized understanding of follow-up arrangements: Yes  Individual responsible for coordination of the follow-up plan: pt  Confirmed correct DME delivered: Missy Baksh 05/22/2015    [name]

## 2015-05-22 NOTE — Progress Notes (Signed)
[name] PHYSICAL MEDICINE & REHABILITATION     PROGRESS NOTE    Subjective/Complaints: No stump pain, slept ok  ROS: Pt denies fever, rash/itching, headache, blurred or double vision, nausea, vomiting, abdominal pain, diarrhea, chest pain, shortness of breath, palpitations, dysuria, dizziness, neck or back pain, bleeding, anxiety, or depression   Objective: Vital Signs: Blood pressure 108/67, pulse 71, temperature 98.3 F (36.8 C), temperature source Oral, resp. rate 18, weight 96.172 kg (212 lb 0.3 oz), SpO2 100 %. No results found.  Recent Labs  05/20/15 0434  WBC 7.6  HGB 8.3*  HCT 25.4*  PLT 380   No results for input(s): NA, K, CL, GLUCOSE, BUN, CREATININE, CALCIUM in the last 72 hours.  Invalid input(s): CO CBG (last 3)   Recent Labs  05/21/15 1650 05/21/15 2058 05/22/15 0641  GLUCAP 76 126* 102*    Wt Readings from Last 3 Encounters:  05/19/15 96.172 kg (212 lb 0.3 oz)  05/10/15 111.273 kg (245 lb 5 oz)  04/28/15 111.299 kg (245 lb 5.9 oz)    Physical Exam:    Cardiovascular: Normal rate, regular rhythm and normal heart sounds. Exam reveals no friction rub.  No murmur heard. Respiratory: Effort normal and breath sounds normal. No respiratory distress. He has no wheezes. He has no rales.  GI: Soft. Bowel sounds are normal. He exhibits no distension. There is no tenderness. There is no rebound.  Musculoskeletal: He exhibits tenderness. Right BK still edematous R-BKA with central, sanginous drainage.  Incision well approximated.  Lateral dital end blister. Otherwise, Area clean. L-AKA well healed.  Lymphadenopathy:   He has no cervical adenopathy.  Neurological: He is alert and oriented to person, place, and time. He displays normal reflexes. No cranial nerve deficit. He exhibits normal muscle tone. Coordination normal.  Pleasant and appropriate. Speech clear. Able to follow commands without difficulty. UE 5/5 delt,bicep, tircep, HI. RLE: 2/5 HF,  2/5 KE. LLE: 4/5 HF. Sensation grossly intact.  Skin: Skin is warm and dry.  Psychiatric: He has a normal mood and affect. His behavior is normal. Judgment and thought content normal.     Assessment/Plan: 1. Functional deficits secondary to new right BKA/ prior left AKA  Stable for D/C today F/u PCP in 1-2 weeks F/u PM&R 3 weeks See D/C summary See D/C instructions FIM: FIM - Bathing Bathing Steps Patient Completed: Chest, Right Arm, Left Arm, Abdomen, Right upper leg, Left upper leg, Front perineal area, Buttocks Bathing: 5: Supervision: Safety issues/verbal cues  FIM - Upper Body Dressing/Undressing Upper body dressing/undressing steps patient completed: Thread/unthread right sleeve of pullover shirt/dresss, Thread/unthread left sleeve of pullover shirt/dress, Put head through opening of pull over shirt/dress, Pull shirt over trunk Upper body dressing/undressing: 7: Complete Independence: No helper FIM - Lower Body Dressing/Undressing Lower body dressing/undressing steps patient completed: Thread/unthread right underwear leg, Thread/unthread left underwear leg, Pull underwear up/down, Thread/unthread right pants leg, Thread/unthread left pants leg, Pull pants up/down Lower body dressing/undressing: 6: More than reasonable amount of time  FIM - Toileting Toileting steps completed by patient: Adjust clothing prior to toileting, Adjust clothing after toileting, Performs perineal hygiene Toileting: 5: Supervision: Safety issues/verbal cues  FIM - Radio producer Devices: Bedside commode Toilet Transfers: 5-To toilet/BSC: Supervision (verbal cues/safety issues), 5-From toilet/BSC: Supervision (verbal cues/safety issues)  FIM - Control and instrumentation engineer Devices: Sliding board, Arm rests Bed/Chair Transfer: 6: Supine > Sit: No assist, 6: Sit > Supine: No assist, 5: Bed > Chair or W/C: Supervision (  verbal cues/safety issues), 5: Chair  or W/C > Bed: Supervision (verbal cues/safety issues)  FIM - Locomotion: Wheelchair Distance: 150 Locomotion: Wheelchair: 6: Travels 150 ft or more, turns around, maneuvers to table, bed or toilet, negotiates 3% grade: maneuvers on rugs and over door sills independently FIM - Locomotion: Ambulation Locomotion: Ambulation: 0: Activity did not occur (bilateral Amputee)  Comprehension Comprehension Mode: Auditory Comprehension: 7-Follows complex conversation/direction: With no assist  Expression Expression Mode: Verbal Expression: 7-Expresses complex ideas: With no assist  Social Interaction Social Interaction: 7-Interacts appropriately with others - No medications needed.  Problem Solving Problem Solving: 7-Solves complex problems: Recognizes & self-corrects  Memory Memory: 7-Complete Independence: No helper  Medical Problem List and Plan: 1. Functional deficits (impaired mobility/self-care) secondary to s/p right BKA (hx of left AKA)-- 2. DVT Prophylaxis/Anticoagulation:  Lovenox--D/C 3. Pain Management: Current regimen of percocet and robaxin remain effective  4. Mood: pt staying up beat.   5. Neuropsych: This patient is capable of making decisions on his own behalf. 6. Skin/Wound Care: Monitor wound daily.   -AK dressing qd to bid  -will go home with ACE---shrinker later once drainage stops 7. Fluids/Electrolytes/Nutrition: Monitor I/O. Encourage PO.  8. DM type 2: Po intake poor at this time. Recommended supplements from home tid- qid (which he prefers). Monitor BS with ac/hs checks. Continue lantus insulin and custom SSI. -fair control in general---cover elevations with SSI for now as elevations are sporadic 9. ABLA:  hgb down to 8.3--follow up hgb on Monday or Tuesday with HH 10. HTN: Monitor bid. Continue tenormin and lasix daily.    -normotensive at present    LOS (Days) 8 A FACE TO FACE EVALUATION WAS PERFORMED  [name] 05/22/2015 7:53 AM

## 2015-05-22 NOTE — Plan of Care (Signed)
Problem: RH SKIN INTEGRITY Goal: RH STG ABLE TO PERFORM INCISION/WOUND CARE W/ASSISTANCE STG Able To Perform Incision/Wound Care. Minimal assist  Outcome: Adequate for Discharge Wife able to perform dressing change with supervision

## 2015-05-22 NOTE — Progress Notes (Signed)
Patient discharged about 36 with wife and all belongings. Discharge instructions given yesterday via Algis Liming PA. Patient and wife denied any questions. Wife did dressing change to Rt stump this am with supervision. A few extra supplies sent home with patient. Patient denied any questions. Vitals stable. Patient denied any pain.

## 2015-05-26 ENCOUNTER — Ambulatory Visit (INDEPENDENT_AMBULATORY_CARE_PROVIDER_SITE_OTHER): Payer: BLUE CROSS/BLUE SHIELD | Admitting: Family Medicine

## 2015-05-26 ENCOUNTER — Encounter: Payer: Self-pay | Admitting: Family Medicine

## 2015-05-26 VITALS — BP 95/65 | HR 79 | Ht 72.0 in

## 2015-05-26 DIAGNOSIS — G4733 Obstructive sleep apnea (adult) (pediatric): Secondary | ICD-10-CM | POA: Diagnosis not present

## 2015-05-26 DIAGNOSIS — N184 Chronic kidney disease, stage 4 (severe): Secondary | ICD-10-CM

## 2015-05-26 DIAGNOSIS — I10 Essential (primary) hypertension: Secondary | ICD-10-CM

## 2015-05-26 NOTE — Addendum Note (Signed)
Addended by: Ilean China on: 05/26/2015 03:30 PM   Modules accepted: Orders

## 2015-05-26 NOTE — Progress Notes (Signed)
Subjective:    Patient ID: [name], male    DOB: 1951/04/28, 64 y.o.   MRN: [id number]  HPI 64 year old gentleman here to follow-up right BK amputation. He had CBC drawn by home health nurse which really attracted my attention because his hemoglobin was low. Looking back over his records there is chronic kidney disease and he is followed by nephrologist. Anemia is probably related to that but I wanted to be sure there is no GI blood loss. He has had colonoscopy within the last 3 years and he has noted no GI bleeding.  Also [name] and his wife requesting the results of sleep study that he had in May. We found the results that show that he does have moderate sleep apnea and would probably benefit from CPAP so will try to get that in the works.  Patient Active Problem List   Diagnosis Date Noted  . Status post below knee amputation of right lower extremity 05/14/2015  . History of left above knee amputation 05/14/2015  . Toe amputation status 04/28/2015  . Diabetic foot infection 04/12/2015  . Pressure ulcer 04/12/2015  . OSA (obstructive sleep apnea) 02/23/2015  . Acute confusional state 08/04/2014  . Type 2 diabetes mellitus with other circulatory complications 99991111  . HLD (hyperlipidemia) 08/04/2014  . CKD (chronic kidney disease) 06/28/2014  . Transient global amnesia 06/27/2014  . TIA (transient ischemic attack) 06/27/2014  . Hypotension 11/08/2013  . OBESITY, UNSPECIFIED 08/17/2010  . SYNCOPE 08/17/2010  . CARDIOVASCULAR FUNCTION STUDY, ABNORMAL 08/17/2010  . DM 08/11/2010  . HYPERLIPIDEMIA 08/11/2010  . Essential hypertension 08/11/2010   Outpatient Encounter Prescriptions as of 05/26/2015  Medication Sig  . acetaminophen (TYLENOL) 325 MG tablet Take 1-2 tablets (325-650 mg total) by mouth every 4 (four) hours as needed for mild pain.  Marland Kitchen atenolol (TENORMIN) 25 MG tablet Take 1 tablet (25 mg total) by mouth daily.  . Calcium Carb-Cholecalciferol (CALCIUM PLUS VITAMIN D3)  600-500 MG-UNIT CAPS Take 1 capsule by mouth daily.   . cilostazol (PLETAL) 100 MG tablet Take 1 tablet (100 mg total) by mouth 2 (two) times daily.  . clopidogrel (PLAVIX) 75 MG tablet Take 1 tablet (75 mg total) by mouth daily.  Marland Kitchen exenatide (BYETTA) 10 MCG/0.04ML SOPN injection Inject 10 mcg into the skin 2 (two) times daily with a meal.  . furosemide (LASIX) 40 MG tablet Take 1 tablet (40 mg total) by mouth daily.  . insulin aspart (NOVOLOG FLEXPEN) 100 UNIT/ML FlexPen Inject 8-10 Units into the skin 3 (three) times daily with meals. CBG <150 8 units, 150-200 9 units, 200-250 10 units  . LANTUS SOLOSTAR 100 UNIT/ML Solostar Pen Inject 34 Units into the skin at bedtime.  . polyethylene glycol (MIRALAX / GLYCOLAX) packet Take 17 g by mouth daily.  . rosuvastatin (CRESTOR) 20 MG tablet Take 1 tablet (20 mg total) by mouth daily.  . [DISCONTINUED] Dextromethorphan-Guaifenesin ([employer] CHEST CONGESTION RELIEF DM) 20-400 MG TABS Take 1 tablet by mouth every 4 (four) hours as needed (cough/congestion).  . [DISCONTINUED] docusate sodium (COLACE) 100 MG capsule Take 1 capsule (100 mg total) by mouth 2 (two) times daily.  . [DISCONTINUED] methocarbamol (ROBAXIN-750) 750 MG tablet Take 1 tablet (750 mg total) by mouth every 8 (eight) hours as needed for muscle spasms.  . [DISCONTINUED] oxyCODONE-acetaminophen (PERCOCET) 10-325 MG per tablet Take 1 tablet by mouth every 6 (six) hours as needed for pain.   No facility-administered encounter medications on file as of 05/26/2015.  Review of Systems  Constitutional: Negative.   Respiratory: Negative.   Cardiovascular: Negative.   Gastrointestinal: Negative.   Neurological: Negative.   Psychiatric/Behavioral: Negative.        Objective:   Physical Exam  Constitutional: He appears well-developed and well-nourished.  Abdominal:  Had initially asked him to come in to check Hemoccults but he prefers to do stool samples at home with cords and give her a  results back to [place] that way. I suspect this is related to a relative lack of erythropoietin  Skin:  Surgical dressing was removed and wound inspected there is no evidence of purulent drainage there is some blood on the dressing but no active bleeding sutures are in place and will probably remain so until follow-up with orthopedist in one week.          Assessment & Plan:  1. Essential hypertension Blood pressure is a little low today. He only is on atenolol 25 mg. I've asked him to drink more water and follow blood pressure  2. CKD (chronic kidney disease), stage 4 (severe) I suspect this is causing his anemia rather than blood loss either postoperatively or through GI tract  3. Sleep apnea Based on report of sleep study will order CPAP with initial setting of 8 cm Wardell Honour MD

## 2015-05-30 ENCOUNTER — Encounter (INDEPENDENT_AMBULATORY_CARE_PROVIDER_SITE_OTHER): Payer: BLUE CROSS/BLUE SHIELD | Admitting: Family Medicine

## 2015-05-30 DIAGNOSIS — I129 Hypertensive chronic kidney disease with stage 1 through stage 4 chronic kidney disease, or unspecified chronic kidney disease: Secondary | ICD-10-CM | POA: Diagnosis not present

## 2015-05-30 DIAGNOSIS — M86171 Other acute osteomyelitis, right ankle and foot: Secondary | ICD-10-CM

## 2015-05-30 DIAGNOSIS — L03031 Cellulitis of right toe: Secondary | ICD-10-CM | POA: Diagnosis not present

## 2015-05-30 DIAGNOSIS — E11628 Type 2 diabetes mellitus with other skin complications: Secondary | ICD-10-CM | POA: Diagnosis not present

## 2015-06-07 ENCOUNTER — Other Ambulatory Visit: Payer: BLUE CROSS/BLUE SHIELD

## 2015-06-07 ENCOUNTER — Encounter: Payer: Self-pay | Admitting: Family Medicine

## 2015-06-07 DIAGNOSIS — Z1212 Encounter for screening for malignant neoplasm of rectum: Secondary | ICD-10-CM

## 2015-06-09 LAB — FECAL OCCULT BLOOD, IMMUNOCHEMICAL: Fecal Occult Bld: NEGATIVE

## 2015-06-16 ENCOUNTER — Other Ambulatory Visit: Payer: Self-pay | Admitting: Family Medicine

## 2015-07-03 ENCOUNTER — Other Ambulatory Visit: Payer: Self-pay | Admitting: Family Medicine

## 2015-07-09 LAB — HEMOGLOBIN A1C: HEMOGLOBIN A1C: 5.6 % (ref 4.0–6.0)

## 2015-07-12 ENCOUNTER — Encounter: Payer: Self-pay | Admitting: Family Medicine

## 2015-07-12 ENCOUNTER — Other Ambulatory Visit: Payer: Self-pay | Admitting: Family Medicine

## 2015-07-13 ENCOUNTER — Encounter: Payer: Self-pay | Admitting: Gastroenterology

## 2015-07-19 ENCOUNTER — Encounter
Payer: BLUE CROSS/BLUE SHIELD | Attending: Physical Medicine & Rehabilitation | Admitting: Physical Medicine & Rehabilitation

## 2015-07-19 ENCOUNTER — Encounter: Payer: Self-pay | Admitting: Physical Medicine & Rehabilitation

## 2015-07-19 VITALS — BP 107/58 | HR 65

## 2015-07-19 DIAGNOSIS — Z5189 Encounter for other specified aftercare: Secondary | ICD-10-CM

## 2015-07-19 DIAGNOSIS — M1990 Unspecified osteoarthritis, unspecified site: Secondary | ICD-10-CM | POA: Insufficient documentation

## 2015-07-19 DIAGNOSIS — I129 Hypertensive chronic kidney disease with stage 1 through stage 4 chronic kidney disease, or unspecified chronic kidney disease: Secondary | ICD-10-CM | POA: Diagnosis not present

## 2015-07-19 DIAGNOSIS — G4730 Sleep apnea, unspecified: Secondary | ICD-10-CM | POA: Diagnosis not present

## 2015-07-19 DIAGNOSIS — E785 Hyperlipidemia, unspecified: Secondary | ICD-10-CM | POA: Insufficient documentation

## 2015-07-19 DIAGNOSIS — I252 Old myocardial infarction: Secondary | ICD-10-CM | POA: Diagnosis not present

## 2015-07-19 DIAGNOSIS — Z89511 Acquired absence of right leg below knee: Secondary | ICD-10-CM | POA: Insufficient documentation

## 2015-07-19 DIAGNOSIS — E119 Type 2 diabetes mellitus without complications: Secondary | ICD-10-CM | POA: Insufficient documentation

## 2015-07-19 NOTE — Progress Notes (Signed)
Subjective:    Patient ID: [name], male    DOB: 11-Jan-1951, 64 y.o.   MRN: [id number]  HPI   [place] is here in follow up of his right below knee amputation. He has been doing fairly well at home. ORtho saw him recently and signed off. His staples are out. He is having minimal pain except for an occasional shooting pain in the right leg. He has no phantom pain in the limb. He states that his sugars are much better controlled. He and his wife perform transfers safely. He hasn't had any falls.  He is still wearing an ACE wrap and dry dressing over the leg. Wife changes the dressing daily.    Pain Inventory Average Pain 0 Pain Right Now 0 My pain is no pain  In the last 24 hours, has pain interfered with the following? General activity 0 Relation with others 0 Enjoyment of life 0 What TIME of day is your pain at its worst? 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  no  Physicians involved in your care Any changes since last visit?  no   Family History  Problem Relation Age of Onset  . Diabetes Mother   . Hypertension Mother   . Cancer Father   . Cancer Sister   . Sickle cell anemia Daughter    Social History   Social History  . Marital Status: Married    Spouse Name: N/A  . Number of Children: 2  . Years of Education: college   Occupational History  . Pastor    Social History Main Topics  . Smoking status: Never Smoker   . Smokeless tobacco: Never Used  . Alcohol Use: No  . Drug Use: No  . Sexual Activity: Not Asked   Other Topics Concern  . None   Social History Narrative   Patient lives with his wife    Patient right handed   Patient drinks caffine on occ.   Past Surgical History  Procedure Laterality Date  . Above knee leg amputation Left 2004  . Cataract extraction w/phaco  09/05/2012    Procedure: CATARACT EXTRACTION PHACO AND INTRAOCULAR LENS PLACEMENT (IOC);  Surgeon: Tonny Branch, MD;  Location: [facility];  Service: Ophthalmology;  Laterality: Left;  CDE=5.45  . Cataract extraction w/phaco  10/03/2012    Procedure: CATARACT EXTRACTION PHACO AND INTRAOCULAR LENS PLACEMENT (IOC);  Surgeon: Tonny Branch, MD;  Location: [facility];  Service: Ophthalmology;  Laterality: Right;  CDE: 12.31  . Colonoscopy    . Amputation Right 04/28/2015    Procedure: AMPUTATION RAY, RIGHT 5TH TOE;  Surgeon: Marybelle Killings, MD;  Location: [place];  Service: Orthopedics;  Laterality: Right;  . Amputation Right 05/10/2015    Procedure: Right Below Knee Amputation;  Surgeon: Marybelle Killings, MD;  Location: [place];  Service: Orthopedics;  Laterality: Right;   Past Medical History  Diagnosis Date  . Hypertension   . Hyperlipidemia   . Necrosis     #2 nail   . Myocardial infarction     "mild" heart attack  . Poor circulation of extremity   . Sleep apnea  uses cpap, getting a new one  . Type 2 Diabetes mellitus     Type 2  . Arthritis   . Pneumonia     as a child  . CKD (chronic kidney disease) 06/28/2014    Sees Dr Florene Glen   BP 107/58 mmHg  Pulse 65  SpO2 98%  Opioid Risk Score:   Fall Risk Score:  `1  Depression screen PHQ 2/9  Depression screen [facility] 2/9 07/19/2015 05/26/2015 05/15/2014  Decreased Interest 0 0 0  Down, Depressed, Hopeless 0 - 0  PHQ - 2 Score 0 0 0  Altered sleeping 0 - -  Tired, decreased energy 0 - -  Change in appetite 0 - -  Feeling bad or failure about yourself  0 - -  Trouble concentrating 0 - -  Moving slowly or fidgety/restless 0 - -  Suicidal thoughts 0 - -  PHQ-9 Score 0 - -     Review of Systems  Respiratory: Positive for apnea.   All other systems reviewed and are  negative.      Objective:   Physical Exam  HENT:  Head: Normocephalic and atraumatic.  Mouth/Throat: Oropharynx is clear and moist. No oropharyngeal exudate.  Eyes: Conjunctivae are normal. Pupils are equal, round, and reactive to light.  Neck: Normal range of motion. Neck supple. No JVD present. No tracheal deviation present. No thyromegaly present.  Cardiovascular: Normal rate, regular rhythm  Respiratory: Effort normal and breath sounds normal.  GI: Soft.  Musculoskeletal: He exhibits tenderness. Right BK still edematous distally. R-BKA minimal drainage centrally still. Generally dry--scabbing centrally. Some fibronecrotic tissue laterally.   Lymphadenopathy:   He has no cervical adenopathy.  Neurological: He is alert and oriented to person, place, and time. He displays normal reflexes. No cranial nerve deficit. He exhibits normal muscle tone. Coordination normal.  Pleasant and appropriate. Speech clear. Able to follow commands without difficulty. UE 5/5 delt,bicep, tircep, HI. RLE: 4-/5 HF, 3+ to 4-/5 KE. LLE: 4/5 HF. Sensation grossly intact.  Skin: Skin is warm and dry.  Psychiatric: He has a normal mood and affect. His behavior is normal. Judgment and thought content normal.     Assessment/Plan:  1. Functional deficits (impaired mobility/self-care) secondary to s/p right BKA (hx of left AKA)-- 2. Pain Management: off meds. Doing well  4. Mood: pt staying up beat.  5. . Skin/Wound Care:  Continue with 4x4, kerlix, ACE---keep wound dry- no ointments. - 6. DM type 2: improved control   Follow up with me in 6 weeks to reassess wound. Can consider stump shrinker then.. Fifteen minutes of face to face patient care time were spent during this visit. All questions were encouraged and answered.

## 2015-07-19 NOTE — Patient Instructions (Signed)
KEEP LEG CLEAN AND DRY. WRAP LEG TIGHTER AT THE END TO HELP MOVE FLUID UP AND OUT.

## 2015-08-20 ENCOUNTER — Telehealth (HOSPITAL_COMMUNITY): Payer: Self-pay | Admitting: Interventional Radiology

## 2015-08-20 NOTE — Telephone Encounter (Signed)
Spoke to pt's wife this am. She states that her husband recently had another leg amputation

## 2015-08-25 ENCOUNTER — Ambulatory Visit: Payer: BLUE CROSS/BLUE SHIELD | Admitting: Family Medicine

## 2015-08-30 ENCOUNTER — Encounter: Payer: BLUE CROSS/BLUE SHIELD | Admitting: Physical Medicine & Rehabilitation

## 2015-09-12 ENCOUNTER — Other Ambulatory Visit: Payer: Self-pay | Admitting: Family Medicine

## 2015-10-10 ENCOUNTER — Other Ambulatory Visit: Payer: Self-pay | Admitting: Family Medicine

## 2015-10-15 ENCOUNTER — Ambulatory Visit (INDEPENDENT_AMBULATORY_CARE_PROVIDER_SITE_OTHER): Payer: BLUE CROSS/BLUE SHIELD | Admitting: Family Medicine

## 2015-10-15 ENCOUNTER — Encounter: Payer: Self-pay | Admitting: Family Medicine

## 2015-10-15 ENCOUNTER — Ambulatory Visit (INDEPENDENT_AMBULATORY_CARE_PROVIDER_SITE_OTHER): Payer: BLUE CROSS/BLUE SHIELD | Admitting: "Endocrinology

## 2015-10-15 ENCOUNTER — Encounter: Payer: Self-pay | Admitting: "Endocrinology

## 2015-10-15 VITALS — BP 106/73 | HR 72 | Ht 70.0 in

## 2015-10-15 VITALS — BP 110/62 | HR 78 | Temp 97.7°F | Resp 12

## 2015-10-15 DIAGNOSIS — E1159 Type 2 diabetes mellitus with other circulatory complications: Secondary | ICD-10-CM

## 2015-10-15 DIAGNOSIS — I10 Essential (primary) hypertension: Secondary | ICD-10-CM | POA: Diagnosis not present

## 2015-10-15 DIAGNOSIS — Z89511 Acquired absence of right leg below knee: Secondary | ICD-10-CM | POA: Diagnosis not present

## 2015-10-15 DIAGNOSIS — N184 Chronic kidney disease, stage 4 (severe): Secondary | ICD-10-CM | POA: Diagnosis not present

## 2015-10-15 DIAGNOSIS — E785 Hyperlipidemia, unspecified: Secondary | ICD-10-CM

## 2015-10-15 MED ORDER — EXENATIDE 10 MCG/0.04ML ~~LOC~~ SOPN: 10.0000 ug | PEN_INJECTOR | Freq: Two times a day (BID) | SUBCUTANEOUS | Status: DC

## 2015-10-15 MED ORDER — METFORMIN HCL 500 MG PO TABS: 500.0000 mg | ORAL_TABLET | Freq: Two times a day (BID) | ORAL | Status: DC

## 2015-10-15 MED ORDER — LANTUS SOLOSTAR 100 UNIT/ML ~~LOC~~ SOPN: 30.0000 [IU] | PEN_INJECTOR | Freq: Every day | SUBCUTANEOUS | Status: DC

## 2015-10-15 NOTE — Patient Instructions (Addendum)
Continue current medication We will call with  Labs Pneumonia vaccine given Pneumonia vaccine given Release of records- [facility]  Release- [place]  F/U 4 months

## 2015-10-15 NOTE — Patient Instructions (Signed)

## 2015-10-15 NOTE — Progress Notes (Signed)
Subjective:    Patient ID: [name], male    DOB: May 09, 1951, PCP Vic Blackbird, MD   Past Medical History  Diagnosis Date  . Hypertension   . Hyperlipidemia   . Necrosis ([place])     #2 nail   . Myocardial infarction ([place])     "mild" heart attack  . Poor circulation of extremity ([place])   . Sleep apnea     uses cpap, getting a new one  . Type 2 Diabetes mellitus     Type 2  . Arthritis   . Pneumonia     as a child  . CKD (chronic kidney disease) 06/28/2014    Sees Dr Florene Glen   Past Surgical History  Procedure Laterality Date  . Above knee leg amputation Left 2004  . Cataract extraction w/phaco  09/05/2012    Procedure: CATARACT EXTRACTION PHACO AND INTRAOCULAR LENS PLACEMENT (IOC);  Surgeon: Tonny Branch, MD;  Location: [facility];  Service: Ophthalmology;  Laterality: Left;  CDE=5.45  . Cataract extraction w/phaco  10/03/2012    Procedure: CATARACT EXTRACTION PHACO AND INTRAOCULAR LENS PLACEMENT (IOC);  Surgeon: Tonny Branch, MD;  Location: [facility];  Service: Ophthalmology;  Laterality: Right;  CDE: 12.31  . Colonoscopy    . Amputation Right 04/28/2015    Procedure: AMPUTATION RAY, RIGHT 5TH TOE;  Surgeon: Marybelle Killings, MD;  Location: [place];  Service: Orthopedics;  Laterality: Right;  . Amputation Right 05/10/2015    Procedure: Right Below Knee Amputation;  Surgeon: Marybelle Killings, MD;  Location: [place];  Service: Orthopedics;  Laterality: Right;   Social History   Social History  . Marital Status: Married    Spouse Name: N/A  . Number of Children: 2  . Years of Education: college   Occupational History  . Pastor    Social History Main Topics  . Smoking status: Never Smoker   . Smokeless tobacco: Never Used  . Alcohol Use: No  . Drug Use: No  . Sexual Activity: Not Asked   Other Topics Concern  . None   Social History Narrative   Patient lives with his wife    Patient right handed   Patient drinks caffine on occ.   Outpatient Encounter Prescriptions as of 10/15/2015   Medication Sig  . acetaminophen (TYLENOL) 325 MG tablet Take 1-2 tablets (325-650 mg total) by mouth every 4 (four) hours as needed for mild pain.  Marland Kitchen atenolol (TENORMIN) 25 MG tablet TAKE 1 TABLET BY MOUTH DAILY  . Calcium Carb-Cholecalciferol (CALCIUM PLUS VITAMIN D3) 600-500 MG-UNIT CAPS Take 1 capsule by mouth daily.   . cilostazol (PLETAL) 100 MG tablet TAKE 1 TABLET BY MOUTH TWICE DAILY  . clopidogrel (PLAVIX) 75 MG tablet TAKE 1 TABLET BY MOUTH DAILY  . exenatide (BYETTA) 10 MCG/0.04ML SOPN injection Inject 0.04 mLs (10 mcg total) into the skin 2 (two) times daily with a meal.  . furosemide (LASIX) 40 MG tablet TAKE 1 TABLET BY MOUTH DAILY  . LANTUS SOLOSTAR 100 UNIT/ML Solostar Pen Inject 30 Units into the skin at bedtime.  . rosuvastatin (CRESTOR) 20 MG tablet TAKE 1 TABLET BY MOUTH DAILY  . [DISCONTINUED] exenatide (BYETTA) 10 MCG/0.04ML SOPN injection Inject 10 mcg into the skin 2 (two) times daily with a meal.  . [DISCONTINUED] insulin aspart (NOVOLOG FLEXPEN) 100 UNIT/ML FlexPen Inject 5-11 Units into the skin 3 (three) times daily with meals. CBG <150 8 units, 150-200 9 units, 200-250 10 units  . [DISCONTINUED] LANTUS  SOLOSTAR 100 UNIT/ML Solostar Pen Inject 34 Units into the skin at bedtime. (Patient taking differently: Inject 30 Units into the skin at bedtime. )  . metFORMIN (GLUCOPHAGE) 500 MG tablet Take 1 tablet (500 mg total) by mouth 2 (two) times daily with a meal.  . [DISCONTINUED] polyethylene glycol (MIRALAX / GLYCOLAX) packet Take 17 g by mouth daily.   No facility-administered encounter medications on file as of 10/15/2015.   ALLERGIES: Allergies  Allergen Reactions  . Zolpidem Tartrate Other (See Comments)    disorientation    VACCINATION STATUS: Immunization History  Administered Date(s) Administered  . Influenza Split 06/30/2013  . Influenza-Unspecified 07/30/2015  . Pneumococcal Polysaccharide-23 07/31/2007  . Td 10/30/2002    Diabetes He presents  for his follow-up diabetic visit. He has type 2 diabetes mellitus. Onset time: He was diagnosed at approximate age of 22 years. His disease course has been improving. There are no hypoglycemic associated symptoms. Pertinent negatives for hypoglycemia include no confusion, headaches, pallor or seizures. There are no diabetic associated symptoms. Pertinent negatives for diabetes include no chest pain, no fatigue, no polydipsia, no polyphagia, no polyuria and no weakness. There are no hypoglycemic complications. Symptoms are improving. Diabetic complications include PVD. (Status post bilateral lower extremity amputations. He is wheelchair-bound, awaiting for leg prosthetics.) Risk factors for coronary artery disease include diabetes mellitus, dyslipidemia, hypertension, male sex, obesity, sedentary lifestyle and tobacco exposure. Current diabetic treatment includes insulin injections (Byetta 10 g subcutaneous twice a day.). He is compliant with treatment most of the time. His weight is decreasing steadily. He is following a diabetic diet. When asked about meal planning, he reported none. He has had a previous visit with a dietitian. He never participates in exercise. His home blood glucose trend is decreasing steadily. His breakfast blood glucose range is generally 130-140 mg/dl. His lunch blood glucose range is generally 130-140 mg/dl. His dinner blood glucose range is generally 130-140 mg/dl. His overall blood glucose range is 130-140 mg/dl. An ACE inhibitor/angiotensin II receptor blocker is being taken.  Hyperlipidemia This is a chronic problem. The current episode started more than 1 year ago. The problem is controlled. Pertinent negatives include no chest pain, myalgias or shortness of breath. Current antihyperlipidemic treatment includes statins. Risk factors for coronary artery disease include dyslipidemia, diabetes mellitus, hypertension, male sex, obesity and a sedentary lifestyle.  Hypertension This is  a chronic problem. The current episode started more than 1 year ago. Pertinent negatives include no chest pain, headaches, neck pain, palpitations or shortness of breath. Risk factors for coronary artery disease include diabetes mellitus, dyslipidemia, male gender, obesity, sedentary lifestyle and smoking/tobacco exposure. Hypertensive end-organ damage includes PVD.     Review of Systems  Constitutional: Negative for fatigue and unexpected weight change.  HENT: Negative for dental problem, mouth sores and trouble swallowing.   Eyes: Negative for visual disturbance.  Respiratory: Negative for cough, choking, chest tightness, shortness of breath and wheezing.   Cardiovascular: Negative for chest pain, palpitations and leg swelling.  Gastrointestinal: Negative for nausea, vomiting, abdominal pain, diarrhea, constipation and abdominal distention.  Endocrine: Negative for polydipsia, polyphagia and polyuria.  Genitourinary: Negative for dysuria, urgency, hematuria and flank pain.  Musculoskeletal: Positive for gait problem. Negative for myalgias, back pain and neck pain.       Wheelchair-bound, awaiting Legs prosthetics.  Skin: Negative for pallor, rash and wound.  Neurological: Negative for seizures, syncope, weakness, numbness and headaches.  Psychiatric/Behavioral: Negative.  Negative for suicidal ideas, confusion and dysphoric mood.  Objective:    BP 106/73 mmHg  Pulse 72  Ht 5\' 10"  (1.778 m)  SpO2 97%  Wt Readings from Last 3 Encounters:  05/19/15 212 lb 0.3 oz (96.172 kg)  05/10/15 245 lb 5 oz (111.273 kg)  04/28/15 245 lb 5.9 oz (111.299 kg)    Physical Exam  Constitutional: He is oriented to person, place, and time. He appears well-developed and well-nourished. He is cooperative. No distress.  HENT:  Head: Normocephalic and atraumatic.  Eyes: EOM are normal.  Neck: Normal range of motion. Neck supple. No tracheal deviation present. No thyromegaly present.  Cardiovascular:  Normal rate, S1 normal, S2 normal and normal heart sounds.  Exam reveals no gallop.   No murmur heard. Pulses:      Dorsalis pedis pulses are 1+ on the right side, and 1+ on the left side.       Posterior tibial pulses are 1+ on the right side, and 1+ on the left side.  Pulmonary/Chest: Breath sounds normal. No respiratory distress. He has no wheezes.  Abdominal: Soft. Bowel sounds are normal. He exhibits no distension. There is no tenderness. There is no guarding and no CVA tenderness.  Musculoskeletal:       Right shoulder: He exhibits no swelling and no deformity.  Bilateral amputations of lower extremities.  Neurological: He is alert and oriented to person, place, and time. He has normal strength and normal reflexes. No cranial nerve deficit or sensory deficit. Gait normal.  Skin: Skin is warm and dry. No rash noted. No cyanosis. Nails show no clubbing.  Psychiatric: He has a normal mood and affect. His speech is normal and behavior is normal. Judgment and thought content normal. Cognition and memory are normal.    Results for orders placed or performed in visit on 10/15/15  Hemoglobin A1c  Result Value Ref Range   Hgb A1c MFr Bld 5.6 4.0 - 6.0 %   Complete Blood Count (Most recent): Lab Results  Component Value Date   WBC 7.6 05/20/2015   HGB 8.3* 05/20/2015   HCT 25.4* 05/20/2015   MCV 87.3 05/20/2015   PLT 380 05/20/2015   Chemistry (most recent): Lab Results  Component Value Date   NA 139 05/15/2015   K 4.3 05/15/2015   CL 106 05/15/2015   CO2 25 05/15/2015   BUN 21* 05/15/2015   CREATININE 1.49* 05/15/2015   Diabetic Labs (most recent): Lab Results  Component Value Date   HGBA1C 5.6 07/09/2015   HGBA1C 7.1* 04/12/2015   HGBA1C 6.0* 06/27/2014   Lipid profile (most recent): Lab Results  Component Value Date   TRIG 83 02/23/2015   CHOL 158 02/23/2015    Assessment & Plan:   1. Type 2 diabetes mellitus with other circulatory complications (HCC)  -His   diabetes is  complicated by extensive peripheral arterial disease with bilateral lower extremity amputations and patient remains at a high risk for more acute and chronic complications of diabetes which include CAD, CVA, CKD, retinopathy, and neuropathy. These are all discussed in detail with the patient.  Patient came with controlled glucose profile, and  recent A1c of 5.6 %.  Glucose logs and insulin administration records pertaining to this visit,  to be scanned into patient's records.  Recent labs reviewed.   - I have re-counseled the patient on diet management and weight loss  by adopting a carbohydrate restricted / protein rich  Diet.  - Suggestion is made for patient to avoid simple carbohydrates   from their  diet including Cakes , Desserts, Ice Cream,  Soda (  diet and regular) , Sweet Tea , Candies,  Chips, Cookies, Artificial Sweeteners,   and "Sugar-free" Products .  This will help patient to have stable blood glucose profile and potentially avoid unintended  Weight gain.  - Patient is advised to stick to a routine mealtimes to eat 3 meals  a day and avoid unnecessary snacks ( to snack only to correct hypoglycemia).  - The patient  has been  scheduled with Jearld Fenton, RDN, CDE for individualized DM education.  - I have approached patient with the following individualized plan to manage diabetes and patient agrees. -  I will continue Lantus 30 units qhs, and  Hold  Novolog for now. - Continue Byetta 10 mcg sq BID.  -I will initiate metformin 500 mg by mouth twice a day.   - Patient specific target  for A1c; LDL, HDL, Triglycerides, and  Waist Circumference were discussed in detail.  2) BP/HTN: Controlled. Continue current medications including ACEI/ARB. 3) Lipids/HPL:  continue statins. 4)  Weight/Diet: CDE consult in progress, exercise, and carbohydrates information provided.  5) Chronic Care/Health Maintenance:  -Patient is on ACEI/ARB and Statin medications and encouraged  to continue to follow up with Ophthalmology, Podiatrist at least yearly or according to recommendations, and advised to  stay away from smoking. I have recommended yearly flu vaccine and pneumonia vaccination at least every 5 years; moderate intensity exercise for up to 150 minutes weekly; and  sleep for at least 7 hours a day.  - 25 minutes of time was spent on the care of this patient , 50% of which was applied for counseling on diabetes complications and their preventions.  - I advised patient to maintain close follow up with Vic Blackbird, MD for primary care needs.  Patient is asked to bring meter and  blood glucose logs during their next visit.   Follow up plan: -Return in about 3 months (around 01/13/2016) for diabetes, high blood pressure, high cholesterol, follow up with pre-visit labs, meter, and logs.  Glade Lloyd, MD Phone: [phone]  Fax: [phone]   10/15/2015, 1:32 PM

## 2015-10-16 LAB — LIPID PANEL
CHOLESTEROL: 135 mg/dL (ref 125–200)
HDL: 46 mg/dL (ref >=40)
LDL Cholesterol: 75 mg/dL (ref <130)
TRIGLYCERIDES: 68 mg/dL (ref <150)
Total CHOL/HDL Ratio: 2.9 Ratio (ref <=5.0)
VLDL: 14 mg/dL (ref <30)

## 2015-10-16 LAB — CBC WITH DIFFERENTIAL/PLATELET

## 2015-10-16 LAB — COMPREHENSIVE METABOLIC PANEL
ALBUMIN: 3.5 g/dL — AB (ref 3.6–5.1)
ALK PHOS: 74 U/L (ref 40–115)
ALT: 19 U/L (ref 9–46)
AST: 19 U/L (ref 10–35)
BILIRUBIN TOTAL: 0.5 mg/dL (ref 0.2–1.2)
BUN: 23 mg/dL (ref 7–25)
CALCIUM: 8.8 mg/dL (ref 8.6–10.3)
CO2: 25 mmol/L (ref 20–31)
Chloride: 103 mmol/L (ref 98–110)
Creat: 1.5 mg/dL — ABNORMAL HIGH (ref 0.70–1.25)
GLUCOSE: 73 mg/dL (ref 70–99)
POTASSIUM: 4.2 mmol/L (ref 3.5–5.3)
Sodium: 139 mmol/L (ref 135–146)
TOTAL PROTEIN: 6.5 g/dL (ref 6.1–8.1)

## 2015-10-16 LAB — HEMOGLOBIN A1C

## 2015-10-18 ENCOUNTER — Encounter: Payer: Self-pay | Admitting: Family Medicine

## 2015-10-18 MED ORDER — CLOPIDOGREL BISULFATE 75 MG PO TABS: 75.0000 mg | ORAL_TABLET | Freq: Every day | ORAL | Status: DC

## 2015-10-18 MED ORDER — CILOSTAZOL 100 MG PO TABS: 100.0000 mg | ORAL_TABLET | Freq: Two times a day (BID) | ORAL | Status: DC

## 2015-10-18 MED ORDER — ATENOLOL 25 MG PO TABS: 25.0000 mg | ORAL_TABLET | Freq: Every day | ORAL | Status: DC

## 2015-10-18 MED ORDER — FUROSEMIDE 40 MG PO TABS: 40.0000 mg | ORAL_TABLET | Freq: Every day | ORAL | Status: DC

## 2015-10-18 MED ORDER — ROSUVASTATIN CALCIUM 20 MG PO TABS: 20.0000 mg | ORAL_TABLET | Freq: Every day | ORAL | Status: DC

## 2015-10-18 NOTE — Assessment & Plan Note (Signed)
Blood pressure looks good today he is not on an ACE inhibitor symphysis due to his renal function he does have a nephrologist. We'll obtain records from his nephrologist

## 2015-10-18 NOTE — Assessment & Plan Note (Signed)
Check cholesterol levels today he is currently on Crestor

## 2015-10-18 NOTE — Assessment & Plan Note (Signed)
Goal is an A1c less than 7% he needs excellent glucose control with his other multiple comorbidities.

## 2015-10-18 NOTE — Progress Notes (Signed)
Patient ID: [name], male   DOB: [birth year], 64 y.o.   MRN: [id number]   Subjective:    Patient ID: [name], male    DOB: November 26, 1950, 64 y.o.   MRN: [id number]  Patient presents for [facility]  A she here to establish care. Previously being followed by [facility]. He is followed by endocrinologist Dr. Dorris Fetch is followed by nephrologist Dr. Florene Glen is followed by rehabilitation physician Dr. Tessa Lerner orthopedics Dr. Lorin Mercy cardiology Dr. Percival Spanish,  ophthalmology [place]. He is also seen neurology in the past after he had episodes of confusion and syncope he had some type of blockage in a vessel found on MRI which is why he is on chronic Plavix.  He has significant past medical history he has history of diabetes mellitus controlled with insulin therapy and metformin. He has had peripheral vascular disease and is now status post bilateral AP Tatian's. His initial amputation was about 12 years ago the last one was about 6 months ago. His risk factors are trying to be maintaining with Crestor for his cholesterol is also on atenolol for hypertension. He does not have any history of coronary artery disease that he is aware of or congestive heart failure however he is on Lasix which she thinks was because he had swelling in his previous extremities although now he is an amputee and is not sure why he is still on the Lasix. He has chronic kidney disease and is followed by nephrology he is not on an ACE inhibitor and has been told only take Tylenol for his pain.  He does get significant phantom limb pain but they have decided not to put him on any medications for this. He is also on Pletal which I said was for his previous peripheral vascular disease he is not sure why he is continued on this.    Review Of Systems:  GEN- denies fatigue, fever, weight loss,weakness, recent illness HEENT- denies eye drainage, change in vision, nasal discharge, CVS- denies  chest pain, palpitations RESP- denies SOB, cough, wheeze ABD- denies N/V, change in stools, abd pain GU- denies dysuria, hematuria, dribbling, incontinence MSK- + joint pain, muscle aches, injury Neuro- denies headache, dizziness, syncope, seizure activity       Objective:    BP 110/62 mmHg  Pulse 78  Temp(Src) 97.7 F (36.5 C) (Oral)  Resp 12 GEN- NAD, alert and oriented x3,sitting in wheelchair HEENT- PERRL, EOMI, non injected sclera, pink conjunctiva, MMM, oropharynx clear Neck- Supple, no thyromegaly CVS- RRR, no murmur RESP-CTAB ABD-NABS,soft,NT,ND EXT- BKA Right leg, AKA, Left  Pulses- Radial, DP- 2+        Assessment & Plan:      Problem List Items Addressed This Visit    Type 2 diabetes mellitus with other circulatory complications (HCC)    Goal is an A1c less than 7% he needs excellent glucose control with his other multiple comorbidities.      Relevant Medications   rosuvastatin (CRESTOR) 20 MG tablet   Other Relevant Orders   Hemoglobin A1c (Completed)   Status post below knee amputation of right lower extremity (HCC)    Bilateral amputations with the right one recently. He is going to undergo prosthesis and physical therapy for this. I provided him with description for shower chair and a new padding for his wheelchair at this point he is wheelchair bound. He is able to transfer with his upper extremities.      Hyperlipidemia -  Primary    Check cholesterol levels today he is currently on Crestor      Relevant Medications   atenolol (TENORMIN) 25 MG tablet   furosemide (LASIX) 40 MG tablet   rosuvastatin (CRESTOR) 20 MG tablet   Other Relevant Orders   Lipid panel (Completed)   Essential hypertension    Blood pressure looks good today he is not on an ACE inhibitor symphysis due to his renal function he does have a nephrologist. We'll obtain records from his nephrologist      Relevant Medications   atenolol (TENORMIN) 25 MG tablet   furosemide  (LASIX) 40 MG tablet   rosuvastatin (CRESTOR) 20 MG tablet   Other Relevant Orders   CBC with Differential/Platelet (Completed)   Comprehensive metabolic panel (Completed)      Note: This dictation was prepared with Dragon dictation along with smaller phrase technology. Any transcriptional errors that result from this process are unintentional.

## 2015-10-18 NOTE — Assessment & Plan Note (Signed)
Bilateral amputations with the right one recently. He is going to undergo prosthesis and physical therapy for this. I provided him with description for shower chair and a new padding for his wheelchair at this point he is wheelchair bound. He is able to transfer with his upper extremities.

## 2015-10-20 ENCOUNTER — Encounter: Payer: Self-pay | Admitting: Family Medicine

## 2015-10-26 ENCOUNTER — Other Ambulatory Visit: Payer: Self-pay

## 2015-10-26 MED ORDER — GLUCOSE BLOOD VI STRP: ORAL_STRIP | Status: DC

## 2015-10-27 LAB — HM DIABETES EYE EXAM

## 2015-10-31 DIAGNOSIS — C61 Malignant neoplasm of prostate: Secondary | ICD-10-CM

## 2015-10-31 HISTORY — DX: Malignant neoplasm of prostate: C61

## 2015-11-08 ENCOUNTER — Encounter: Payer: Self-pay | Admitting: *Deleted

## 2015-11-19 ENCOUNTER — Other Ambulatory Visit: Payer: Self-pay | Admitting: Family Medicine

## 2015-11-19 NOTE — Telephone Encounter (Signed)
Refill appropriate and filled per protocol. 

## 2015-12-03 ENCOUNTER — Other Ambulatory Visit: Payer: Self-pay | Admitting: Family Medicine

## 2015-12-09 ENCOUNTER — Other Ambulatory Visit: Payer: Self-pay | Admitting: *Deleted

## 2015-12-09 MED ORDER — ATENOLOL 25 MG PO TABS: 25.0000 mg | ORAL_TABLET | Freq: Every day | ORAL | Status: DC

## 2015-12-09 MED ORDER — CILOSTAZOL 100 MG PO TABS: 100.0000 mg | ORAL_TABLET | Freq: Two times a day (BID) | ORAL | Status: DC

## 2015-12-09 MED ORDER — ROSUVASTATIN CALCIUM 20 MG PO TABS: 20.0000 mg | ORAL_TABLET | Freq: Every day | ORAL | Status: DC

## 2015-12-09 NOTE — Telephone Encounter (Signed)
Received fax requesting refill on Pletal, Crestor, and Atenolol with 90 day supplies.   Refill appropriate and filled per protocol.

## 2015-12-29 ENCOUNTER — Ambulatory Visit: Payer: BLUE CROSS/BLUE SHIELD | Attending: Orthopaedic Surgery | Admitting: Physical Therapy

## 2015-12-29 ENCOUNTER — Encounter: Payer: Self-pay | Admitting: Physical Therapy

## 2015-12-29 DIAGNOSIS — Z89612 Acquired absence of left leg above knee: Secondary | ICD-10-CM

## 2015-12-29 DIAGNOSIS — M2560 Stiffness of unspecified joint, not elsewhere classified: Secondary | ICD-10-CM

## 2015-12-29 DIAGNOSIS — M623 Immobility syndrome (paraplegic): Secondary | ICD-10-CM | POA: Insufficient documentation

## 2015-12-29 DIAGNOSIS — Z89511 Acquired absence of right leg below knee: Secondary | ICD-10-CM | POA: Diagnosis present

## 2015-12-29 DIAGNOSIS — R6889 Other general symptoms and signs: Secondary | ICD-10-CM | POA: Diagnosis present

## 2015-12-29 DIAGNOSIS — R269 Unspecified abnormalities of gait and mobility: Secondary | ICD-10-CM | POA: Diagnosis not present

## 2015-12-29 DIAGNOSIS — R531 Weakness: Secondary | ICD-10-CM | POA: Insufficient documentation

## 2015-12-29 DIAGNOSIS — R2681 Unsteadiness on feet: Secondary | ICD-10-CM | POA: Insufficient documentation

## 2015-12-29 DIAGNOSIS — R29818 Other symptoms and signs involving the nervous system: Secondary | ICD-10-CM | POA: Insufficient documentation

## 2015-12-29 DIAGNOSIS — Z5189 Encounter for other specified aftercare: Secondary | ICD-10-CM | POA: Insufficient documentation

## 2015-12-29 DIAGNOSIS — Z7409 Other reduced mobility: Secondary | ICD-10-CM

## 2015-12-29 DIAGNOSIS — Z4789 Encounter for other orthopedic aftercare: Secondary | ICD-10-CM

## 2015-12-29 DIAGNOSIS — R2689 Other abnormalities of gait and mobility: Secondary | ICD-10-CM

## 2015-12-29 NOTE — Therapy (Signed)
[facility] [address] [place], [place], [address] Phone: [phone]   Fax:  [phone]  Physical Therapy Evaluation  Patient Details  Name: [name] MRN: [id number] Date of Birth: [birth year] Referring Provider: Rodell Perna, MD  Encounter Date: 12/29/2015      PT End of Session - 12/29/15 1100    Visit Number 1   Number of Visits 24   Date for PT Re-Evaluation 03/17/16   Authorization Type BCBS   PT Start Time 1016   PT Stop Time 1110   PT Time Calculation (min) 54 min   Equipment Utilized During Treatment Gait belt   Activity Tolerance Patient tolerated treatment well   Behavior During Therapy [facility] for tasks assessed/performed      Past Medical History  Diagnosis Date  . Hypertension   . Hyperlipidemia   . Necrosis ([place])     #2 nail   . Myocardial infarction ([place])     "mild" heart attack  . Poor circulation of extremity ([place])   . Sleep apnea     uses cpap, getting a new one  . Type 2 Diabetes mellitus     Type 2  . Arthritis   . Pneumonia     as a child  . CKD (chronic kidney disease) 06/28/2014    Sees Dr Florene Glen  . Cerebrovascular disease     MRI shows Right carotid inferior cavernours narrowing 75% and  50-75% stenosis of Cavernous and supraclinoi right side    Past Surgical History  Procedure Laterality Date  . Above knee leg amputation Left 2004  . Cataract extraction w/phaco  09/05/2012    Procedure: CATARACT EXTRACTION PHACO AND INTRAOCULAR LENS PLACEMENT (IOC);  Surgeon: Tonny Branch, MD;  Location: [facility];  Service: Ophthalmology;  Laterality: Left;  CDE=5.45  . Cataract extraction w/phaco  10/03/2012    Procedure: CATARACT EXTRACTION PHACO AND INTRAOCULAR LENS PLACEMENT (IOC);  Surgeon: Tonny Branch, MD;  Location: [facility];  Service: Ophthalmology;  Laterality: Right;  CDE: 12.31  . Colonoscopy    . Amputation Right 04/28/2015    Procedure: AMPUTATION RAY, RIGHT 5TH TOE;  Surgeon: Marybelle Killings, MD;  Location: [facility];  Service: Orthopedics;  Laterality: Right;  . Amputation Right 05/10/2015    Procedure: Right Below Knee Amputation;  Surgeon: Marybelle Killings, MD;  Location: [facility];  Service: Orthopedics;  Laterality: Right;    There were no vitals filed for this visit.  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 He developed wounds on his right foot in June 2016. He underwent a right Transtibial Amputation 05/10/2015. He has been non-ambulatory since amputation of second limb. He recieved his first prosthesis for right Transtibial Amputation on 12/10/2015. He has been non-ambulatory since delivery waiting on PT evaluation. Patient presents for PT evaluation with his wife & a church member.    Patient is accompained by: Family member   Pertinent History IDDM, CKD, HTN, MI, TIA, Left Transfemoral Amputation, arthriits, neuropathy, obesity   Limitations Lifting;Standing;Walking;House  hold activities   Patient Stated Goals To walk in his home, church (pastor) stand up to preach,    Currently in Pain? No/denies            [facility] PT Assessment - 12/29/15 1015    Assessment   Medical Diagnosis Right Transtibial Amputation, left Transfemoral Amputation   Referring Provider Rodell Perna, MD   Onset Date/Surgical Date 12/10/15  right prosthesis delivery,  left prosthesis 3-4 yrs old   Hand Dominance Right   Precautions   Precautions Fall   Restrictions   Weight Bearing Restrictions No   Balance  Screen   Has the patient fallen in the past 6 months No   Has the patient had a decrease in activity level because of a fear of falling?  No   Is the patient reluctant to leave their home because of a fear of falling?  No   Home Environment   Living Environment Private residence   Living Arrangements Spouse/significant other  [place] grandchildren stay sometimes   Type of National entrance  or 3 steps no rail front, 2 steps with rail in back   [place] - standard;Cane - quad;Crutches;Bedside commode;Tub bench;Hand held shower head;Wheelchair - Education officer, community - power;Hospital bed   Prior Function   Level of Independence Independent;Independent with household mobility with device;Independent with community mobility with device  AKA prosthesis & cane quad   Vocation Full time employment   Systems analyst, 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, 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, hospital visitations, conventions, home visits   Observation/Other Assessments   Focus on Therapeutic Outcomes (FOTO)  46.78 Functional Status   Activities of Balance Confidence Scale (ABC Scale)  0.0%   Fear Avoidance Belief Questionnaire (FABQ)  19 (5)   Posture/Postural Control   Posture/Postural Control Postural limitations   Postural Limitations Rounded Shoulders;Forward head;Increased lumbar lordosis;Flexed trunk  wide stance   ROM / Strength   AROM / PROM / Strength AROM;Strength   AROM   Overall AROM  Within functional limits for tasks performed   Overall AROM Comments hip flexors appear tight in standing   Strength   Overall Strength Within functional limits for tasks performed   Overall Strength Comments UEs & LEs tested grossly in sitting WFL   Transfers   Transfers Sit to Stand;Stand to Sit   Sit to Stand 3: Mod assist;With upper extremity assist;With armrests;From chair/3-in-1  w/c has high seat, requires RW to stabilize   Stand to Sit 4: Min  assist;With upper extremity assist;With armrests;To chair/3-in-1  requires RW to control descent   Ambulation/Gait   Ambulation/Gait Yes   Ambulation/Gait Assistance 3: Mod assist  2nd person for safety   Ambulation/Gait Assistance Details PT demo, instructed in basic technique with bil. prostheses & RW   Ambulation Distance (Feet) 30 Feet   Assistive device Rolling walker;Prostheses  left Transfemoral, right Transtibial   Gait Pattern Step-to pattern;Decreased step length - right;Decreased stance time - left;Decreased stride length;Decreased hip/knee flexion - left;Lateral hip instability;Trunk flexed   Ambulation Surface Indoor;Level   Balance   Balance Assessed Yes   Static Standing Balance   Static Standing - Balance Support Right upper extremity supported;Bilateral upper extremity supported;No upper extremity supported   Static Standing - Level of Assistance 5: Stand by assistance;3: Mod assist  SBA with single or BUE on RW, ModA without UE   Static Standing - Comment/# of Minutes stands 5  sec without UE support with modA; stands >2 minutes with RW support with SBA  stands with Bil. UE on RW with eyes closed with SBA   Dynamic Standing Balance   Dynamic Standing - Balance Support Left upper extremity supported;Right upper extremity supported   Dynamic Standing - Level of Assistance 4: Min assist   Dynamic Standing - Balance Activities Head turns;Reaching for objects   Dynamic Standing - Comments With single UE support on RW: reaches 2" forward, laterally & across midline, reaches to floor with minA. Able to rotate shoulders to look behind him with minA.          Prosthetics Assessment - 12/29/15 1015    Prosthetics   Prosthetic Care Dependent with Skin check;Residual limb care;Prosthetic cleaning;Ply sock cleaning;Correct ply sock adjustment;Proper wear schedule/adjustment;Proper weight-bearing schedule/adjustment   Donning prosthesis  Min assist   Doffing prosthesis  Min assist    Current prosthetic wear tolerance (days/week)  reports wear 1 day since delivery 19 days ago   Current prosthetic wear tolerance (#hours/day)  30 minutes for one wear since delivery   Current prosthetic weight-bearing tolerance (hours/day)  Patient tolerated 5 minutes of standing / gait with partial weight on prosthesis with no c/o discomfort or pain in either residual limb   Edema Transtibial Amputation pitting edema; Transfemoral Amputation no edema noted   Residual limb condition  Transtibial Amputation bulbous shape, no open areas, minimal hair growth, normal color, temperature & moisture.                  [facility] Adult PT Treatment/Exercise - 12/29/15 1015    Prosthetics   Education Provided Skin check;Residual limb care;Prosthetic cleaning;Correct ply sock adjustment;Proper Donning;Proper Doffing;Proper wear schedule/adjustment;Proper weight-bearing schedule/adjustment   Person(s) Educated Patient;Spouse   Education Method Explanation;Demonstration;Tactile cues;Verbal cues   Education Method Verbalized understanding;Returned demonstration;Tactile cues required;Verbal cues required;Needs further instruction                  PT Short Term Goals - 12/29/15 1115    PT SHORT TERM GOAL #1   Title Patient donnes bilateral prostheses correctly and verbalizes proper cleaning of prostheses. (Target Date: 01/24/2016)   Time 1   Period Months   Status New   PT SHORT TERM GOAL #2   Title Patient tolerates wear of bilateral prostheses >8 hrs total per day without skin issues or limb tenderness. (Target Date: 01/24/2016)   Time 1   Period Months   Status New   PT SHORT TERM GOAL #3   Title Sit to/from stand transfers from w/c to RW with bilateral prostheses with supervision. (Target Date: 01/24/2016)   Time 1   Period Months   Status New   PT SHORT TERM GOAL #4   Title Patient reaches 7" anteriorly, laterally, across midline and to floor with RW support with bilateral  prostheses with supervision. (Target Date: 01/24/2016)   Time 1   Period Months   Status New   PT SHORT TERM GOAL #5   Title Patient ambulates 80' with RW & bilateral prostheses with minA. (Target Date: 01/24/2016)   Time 1   Period Months   Status New           PT Long Term Goals - 12/29/15 1115    PT LONG TERM GOAL #1   Title Patient verbalizes & demonstrates proper prosthetic care to enable safe use of prostheses. (Target Date: 03/17/2016)   Time 12   Period Weeks   Status New   PT  LONG TERM GOAL #2   Title Patient tolerates wear of bilateral prostheses >90% of awake hours without skin issues or limb tenderness to enable function throughout his day.  (Target Date: 03/17/2016)   Time 12   Period Weeks   Status New   PT LONG TERM GOAL #3   Title Patient able to perform standing balance activities with UE support reaching >10" all directions & to floor modified independent.  (Target Date: 03/17/2016)   Time 12   Period Weeks   Status New   PT LONG TERM GOAL #4   Title Patient ambulates 300' with LRAD & bilateral prostheses modified independent to enable community mobility.  (Target Date: 03/17/2016)   Time 12   Period Weeks   Status New   PT LONG TERM GOAL #5   Title Patient negotiates ramps, curbs & stairs with LRAD & bilateral prostheses modified independent to enable community mobility.  (Target Date: 03/17/2016)   Time 12   Period Weeks   Status New   Additional Long Term Goals   Additional Long Term Goals Yes   PT LONG TERM GOAL #6   Title Patient ambulates household around furniture with [facility] & bilateral prostheses modified independent.  (Target Date: 03/17/2016)   Time 12   Period Weeks   Status New               Plan - 12/29/15 1115    Clinical Impression Statement This 65yo male was active at full community level with variable cadence with Transfemoral Amputation prosthesis including tolerating wear all awake hours prior to amputation of second leg. He is  deconditioned due to w/c bound, non-ambulatory for 8 months. He is dependent in prosthetic care which limits safe wear & use of bilateral prostheses. He has only worn Transtibial prosthesis once for 30 minutes and has not worn his Transfemoral prosthesis for 8 months limiting time he can be functional throughout his day. Patient self rate Activities of Balance Confidence at 0.0%. Patient is dependent in sit to/from stand transfers and standing balance with RW. He has high fall risk. Patient's gait is dependent & limited. Patient's condition is evolving and plan of care has moderate complexity.    Pt will benefit from skilled therapeutic intervention in order to improve on the following deficits Abnormal gait;Decreased activity tolerance;Decreased balance;Decreased knowledge of precautions;Decreased knowledge of use of DME;Decreased mobility;Postural dysfunction;Prosthetic Dependency   Rehab Potential Good   PT Frequency 2x / week   PT Duration 12 weeks   PT Treatment/Interventions ADLs/Self Care Home Management;DME Instruction;Gait training;Stair training;Functional mobility training;Therapeutic activities;Therapeutic exercise;Balance training;Neuromuscular re-education;Patient/family education;Prosthetic Training   PT Next Visit Plan review prosthetic care, standing balance & gait with bilateral prostheses & RW, HEP for midline at sink   Consulted and Agree with Plan of Care Patient;Family member/caregiver   Family Member Consulted wife         Problem List Patient Active Problem List   Diagnosis Date Noted  . Visit for wound care 07/19/2015  . Status post below knee amputation of right lower extremity ([place]) 05/14/2015  . History of left above knee amputation ([place]) 05/14/2015  . Toe amputation status ([place]) 04/28/2015  . OSA (obstructive sleep apnea) 02/23/2015  . Type 2 diabetes mellitus with other circulatory complications ([place]) 99991111  . HLD (hyperlipidemia) 08/04/2014  . CKD (chronic  kidney disease) 06/28/2014  . TIA (transient ischemic attack) 06/27/2014  . OBESITY, UNSPECIFIED 08/17/2010  . SYNCOPE 08/17/2010  . Hyperlipidemia 08/11/2010  . Essential hypertension 08/11/2010  Jamey Reas PT, DPT 12/29/2015, 9:03 PM  [facility] [address] [place], [place], [address] Phone: [phone]   Fax:  [phone]  Name: [name] MRN: [id number] Date of Birth: 10/13/1951

## 2015-12-31 ENCOUNTER — Encounter: Payer: Self-pay | Admitting: Physical Therapy

## 2015-12-31 ENCOUNTER — Ambulatory Visit: Payer: BLUE CROSS/BLUE SHIELD | Admitting: Physical Therapy

## 2015-12-31 DIAGNOSIS — R2689 Other abnormalities of gait and mobility: Secondary | ICD-10-CM

## 2015-12-31 DIAGNOSIS — R269 Unspecified abnormalities of gait and mobility: Secondary | ICD-10-CM | POA: Diagnosis not present

## 2015-12-31 DIAGNOSIS — Z7409 Other reduced mobility: Secondary | ICD-10-CM

## 2015-12-31 DIAGNOSIS — R2681 Unsteadiness on feet: Secondary | ICD-10-CM

## 2015-12-31 DIAGNOSIS — Z89511 Acquired absence of right leg below knee: Secondary | ICD-10-CM

## 2015-12-31 DIAGNOSIS — Z89612 Acquired absence of left leg above knee: Secondary | ICD-10-CM

## 2015-12-31 DIAGNOSIS — R6889 Other general symptoms and signs: Secondary | ICD-10-CM

## 2015-12-31 DIAGNOSIS — R531 Weakness: Secondary | ICD-10-CM

## 2015-12-31 DIAGNOSIS — M2560 Stiffness of unspecified joint, not elsewhere classified: Secondary | ICD-10-CM

## 2015-12-31 NOTE — Patient Instructions (Signed)

## 2015-12-31 NOTE — Therapy (Signed)
[facility] [address] [place], [place], [address] Phone: [phone]   Fax:  [phone]  Physical Therapy Treatment  Patient Details  Name: [name] MRN: [id number] Date of Birth: [birth year] Referring Provider: Rodell Perna, MD  Encounter Date: 12/31/2015      PT End of Session - 12/31/15 0851    Visit Number 2   Number of Visits 24   Date for PT Re-Evaluation 03/17/16   Authorization Type BCBS   PT Start Time 0848   PT Stop Time 0930   PT Time Calculation (min) 42 min   Equipment Utilized During Treatment Gait belt   Activity Tolerance Patient tolerated treatment well   Behavior During Therapy [facility] for tasks assessed/performed      Past Medical History  Diagnosis Date  . Hypertension   . Hyperlipidemia   . Necrosis ([place])     #2 nail   . Myocardial infarction ([place])     "mild" heart attack  . Poor circulation of extremity ([place])   . Sleep apnea     uses cpap, getting a new one  . Type 2 Diabetes mellitus     Type 2  . Arthritis   . Pneumonia     as a child  . CKD (chronic kidney disease) 06/28/2014    Sees Dr Florene Glen  . Cerebrovascular disease     MRI shows Right carotid inferior cavernours narrowing 75% and  50-75% stenosis of Cavernous and supraclinoi right side    Past Surgical History  Procedure Laterality Date  . Above knee leg amputation Left 2004  . Cataract extraction w/phaco  09/05/2012    Procedure: CATARACT EXTRACTION PHACO AND INTRAOCULAR LENS PLACEMENT (IOC);  Surgeon: Tonny Branch, MD;  Location: [facility];  Service: Ophthalmology;  Laterality: Left;  CDE=5.45  . Cataract extraction w/phaco  10/03/2012    Procedure: CATARACT EXTRACTION PHACO AND INTRAOCULAR LENS PLACEMENT (IOC);  Surgeon: Tonny Branch, MD;  Location: [facility];  Service: Ophthalmology;  Laterality: Right;  CDE: 12.31  . Colonoscopy    . Amputation Right 04/28/2015    Procedure: AMPUTATION RAY, RIGHT 5TH TOE;  Surgeon: Marybelle Killings,  MD;  Location: [facility];  Service: Orthopedics;  Laterality: Right;  . Amputation Right 05/10/2015    Procedure: Right Below Knee Amputation;  Surgeon: Marybelle Killings, MD;  Location: [facility];  Service: Orthopedics;  Laterality: Right;    There were no vitals filed for this visit.  Visit Diagnosis:  Abnormality of gait  Unsteadiness  Balance problems  Weakness  Stiffness due to immobility  Decreased functional activity tolerance  Status post below knee amputation of right lower extremity (HCC)  Status post above knee amputation of left lower extremity (HCC)      Subjective Assessment - 12/31/15 0850    Subjective No new complaints. No falls or pain to report.   Patient is accompained by: Family member   Pertinent History IDDM, CKD, HTN, MI, TIA, Left Transfemoral Amputation, arthriits, neuropathy, obesity   Limitations Lifting;Standing;Walking;House hold activities   Currently in Pain? No/denies          [facility] Adult PT Treatment/Exercise - 12/31/15 0852    Transfers   Transfers Sit to Stand;Stand to Sit   Sit to Stand 3: Mod assist;With upper extremity assist;With armrests;From chair/3-in-1   Sit to Stand Details Verbal cues for technique;Verbal cues for sequencing;Verbal cues for precautions/safety;Verbal cues for safe use of DME/AE;Manual facilitation for weight shifting   Stand to  Sit 4: Min assist;With upper extremity assist;With armrests;To chair/3-in-1   Stand to Sit Details (indicate cue type and reason) Verbal cues for technique;Verbal cues for sequencing;Verbal cues for precautions/safety;Verbal cues for safe use of DME/AE   Ambulation/Gait   Ambulation/Gait Yes   Ambulation/Gait Assistance 3: Mod assist   Ambulation/Gait Assistance Details cues for posture, walker position with gait, for proper left foot placement with gait, for proper right foot placement with gait. cues on step length as well. once instance of left knee buckling cue to misplacment with gait, min assist  to recover  balance.                                     Ambulation Distance (Feet) 30 Feet   Assistive device Rolling walker;Prostheses   Gait Pattern Step-to pattern;Decreased step length - right;Decreased stance time - left;Decreased stride length;Decreased hip/knee flexion - left;Lateral hip instability;Trunk flexed   Ambulation Surface Level;Indoor   Prosthetics   Prosthetic Care Comments  pt has only been wearing his right prosthesis. pt instructed to wear both 2 hours 2x day.    Current prosthetic wear tolerance (days/week)  wearing right prosthesis only since eval, was unsure about left one   Current prosthetic wear tolerance (#hours/day)  2 hours 2 x day   Residual limb condition  right limb: intact with no issues;left limb: intact with no issues   Education Provided Residual limb care;Correct ply sock adjustment;Proper Donning;Proper Doffing;Proper weight-bearing schedule/adjustment;Proper wear schedule/adjustment   Person(s) Educated Patient;Spouse;Other (comment)  church membetr   Education Method Explanation;Demonstration;Verbal cues   Education Method Verbalized understanding;Needs further instruction;Verbal cues required            PT Education - 12/31/15 1228    Education provided Yes   Education Details HEP: educated on sink exercises for proprioception/balance at home   Person(s) Educated Patient;Spouse   Methods Explanation;Demonstration;Handout;Verbal cues;Tactile cues   Comprehension Verbalized understanding;Returned demonstration;Tactile cues required;Verbal cues required;Need further instruction          PT Short Term Goals - 12/29/15 1115    PT SHORT TERM GOAL #1   Title Patient donnes bilateral prostheses correctly and verbalizes proper cleaning of prostheses. (Target Date: 01/24/2016)   Time 1   Period Months   Status New   PT SHORT TERM GOAL #2   Title Patient tolerates wear of bilateral prostheses >8 hrs total per day without skin issues or limb  tenderness. (Target Date: 01/24/2016)   Time 1   Period Months   Status New   PT SHORT TERM GOAL #3   Title Sit to/from stand transfers from w/c to RW with bilateral prostheses with supervision. (Target Date: 01/24/2016)   Time 1   Period Months   Status New   PT SHORT TERM GOAL #4   Title Patient reaches 7" anteriorly, laterally, across midline and to floor with RW support with bilateral prostheses with supervision. (Target Date: 01/24/2016)   Time 1   Period Months   Status New   PT SHORT TERM GOAL #5   Title Patient ambulates 38' with RW & bilateral prostheses with minA. (Target Date: 01/24/2016)   Time 1   Period Months   Status New           PT Long Term Goals - 12/29/15 1115    PT LONG TERM GOAL #1   Title Patient verbalizes & demonstrates proper prosthetic care to enable safe  use of prostheses. (Target Date: 03/17/2016)   Time 12   Period Weeks   Status New   PT LONG TERM GOAL #2   Title Patient tolerates wear of bilateral prostheses >90% of awake hours without skin issues or limb tenderness to enable function throughout his day.  (Target Date: 03/17/2016)   Time 12   Period Weeks   Status New   PT LONG TERM GOAL #3   Title Patient able to perform standing balance activities with UE support reaching >10" all directions & to floor modified independent.  (Target Date: 03/17/2016)   Time 12   Period Weeks   Status New   PT LONG TERM GOAL #4   Title Patient ambulates 300' with LRAD & bilateral prostheses modified independent to enable community mobility.  (Target Date: 03/17/2016)   Time 12   Period Weeks   Status New   PT LONG TERM GOAL #5   Title Patient negotiates ramps, curbs & stairs with LRAD & bilateral prostheses modified independent to enable community mobility.  (Target Date: 03/17/2016)   Time 12   Period Weeks   Status New   Additional Long Term Goals   Additional Long Term Goals Yes   PT LONG TERM GOAL #6   Title Patient ambulates household around  furniture with [facility] & bilateral prostheses modified independent.  (Target Date: 03/17/2016)   Time 12   Period Weeks   Status New           Plan - 12/31/15 0851    Clinical Impression Statement Today's skilled session focused on prosthetic education with gait and intiation of sink HEP for proprioception/balance. Pt demo's decreased balance with gait with RW, advised him not to ambulate at home at this time. Pt is making steady progress toward goals.,   Pt will benefit from skilled therapeutic intervention in order to improve on the following deficits Abnormal gait;Decreased activity tolerance;Decreased balance;Decreased knowledge of precautions;Decreased knowledge of use of DME;Decreased mobility;Postural dysfunction;Prosthetic Dependency   Rehab Potential Good   PT Frequency 2x / week   PT Duration 12 weeks   PT Treatment/Interventions ADLs/Self Care Home Management;DME Instruction;Gait training;Stair training;Functional mobility training;Therapeutic activities;Therapeutic exercise;Balance training;Neuromuscular re-education;Patient/family education;Prosthetic Training   PT Next Visit Plan review prosthetic care, standing balance & gait with bilateral prostheses & RW.   Consulted and Agree with Plan of Care Patient;Family member/caregiver   Family Member Consulted wife        Problem List Patient Active Problem List   Diagnosis Date Noted  . Visit for wound care 07/19/2015  . Status post below knee amputation of right lower extremity ([place]) 05/14/2015  . History of left above knee amputation ([place]) 05/14/2015  . Toe amputation status ([place]) 04/28/2015  . OSA (obstructive sleep apnea) 02/23/2015  . Type 2 diabetes mellitus with other circulatory complications ([place]) 99991111  . HLD (hyperlipidemia) 08/04/2014  . CKD (chronic kidney disease) 06/28/2014  . TIA (transient ischemic attack) 06/27/2014  . OBESITY, UNSPECIFIED 08/17/2010  . SYNCOPE 08/17/2010  . Hyperlipidemia 08/11/2010   . Essential hypertension 08/11/2010    Willow Ora 12/31/2015, 2:27 PM  Willow Ora, PTA, [facility] [address], [place], [place] [address] [phone] 12/31/2015, 2:27 PM   Name: SIDHANT KULT MRN: [id number] Date of Birth: 1951-09-28

## 2016-01-03 ENCOUNTER — Encounter: Payer: Self-pay | Admitting: Physical Therapy

## 2016-01-03 ENCOUNTER — Ambulatory Visit: Payer: BLUE CROSS/BLUE SHIELD | Admitting: Physical Therapy

## 2016-01-03 DIAGNOSIS — R531 Weakness: Secondary | ICD-10-CM

## 2016-01-03 DIAGNOSIS — R269 Unspecified abnormalities of gait and mobility: Secondary | ICD-10-CM

## 2016-01-03 DIAGNOSIS — R6889 Other general symptoms and signs: Secondary | ICD-10-CM

## 2016-01-03 DIAGNOSIS — Z4789 Encounter for other orthopedic aftercare: Secondary | ICD-10-CM

## 2016-01-03 DIAGNOSIS — R2681 Unsteadiness on feet: Secondary | ICD-10-CM

## 2016-01-03 DIAGNOSIS — Z7409 Other reduced mobility: Secondary | ICD-10-CM

## 2016-01-03 DIAGNOSIS — Z89511 Acquired absence of right leg below knee: Secondary | ICD-10-CM

## 2016-01-03 DIAGNOSIS — M2560 Stiffness of unspecified joint, not elsewhere classified: Secondary | ICD-10-CM

## 2016-01-03 DIAGNOSIS — R2689 Other abnormalities of gait and mobility: Secondary | ICD-10-CM

## 2016-01-03 DIAGNOSIS — Z89612 Acquired absence of left leg above knee: Secondary | ICD-10-CM

## 2016-01-03 NOTE — Therapy (Signed)
[facility] [address] [place], [place], [address] Phone: [phone]   Fax:  [phone]  Physical Therapy Treatment  Patient Details  Name: [name] MRN: [id number] Date of Birth: 01-May-1951 Referring Provider: Rodell Perna, MD  Encounter Date: 01/03/2016      PT End of Session - 01/03/16 1020    Visit Number 3   Number of Visits 24   Date for PT Re-Evaluation 03/17/16   Authorization Type BCBS   PT Start Time [phone]   PT Stop Time 1020   PT Time Calculation (min) 42 min   Equipment Utilized During Treatment Gait belt   Activity Tolerance Patient tolerated treatment well   Behavior During Therapy [facility] for tasks assessed/performed      Past Medical History  Diagnosis Date  . Hypertension   . Hyperlipidemia   . Necrosis ([place])     #2 nail   . Myocardial infarction ([place])     "mild" heart attack  . Poor circulation of extremity ([place])   . Sleep apnea     uses cpap, getting a new one  . Type 2 Diabetes mellitus     Type 2  . Arthritis   . Pneumonia     as a child  . CKD (chronic kidney disease) 06/28/2014    Sees Dr Florene Glen  . Cerebrovascular disease     MRI shows Right carotid inferior cavernours narrowing 75% and  50-75% stenosis of Cavernous and supraclinoi right side    Past Surgical History  Procedure Laterality Date  . Above knee leg amputation Left 2004  . Cataract extraction w/phaco  09/05/2012    Procedure: CATARACT EXTRACTION PHACO AND INTRAOCULAR LENS PLACEMENT (IOC);  Surgeon: Tonny Branch, MD;  Location: [facility];  Service: Ophthalmology;  Laterality: Left;  CDE=5.45  . Cataract extraction w/phaco  10/03/2012    Procedure: CATARACT EXTRACTION PHACO AND INTRAOCULAR LENS PLACEMENT (IOC);  Surgeon: Tonny Branch, MD;  Location: [facility];  Service: Ophthalmology;  Laterality: Right;  CDE: 12.31  . Colonoscopy    . Amputation Right 04/28/2015    Procedure: AMPUTATION RAY, RIGHT 5TH TOE;  Surgeon: Marybelle Killings,  MD;  Location: [place];  Service: Orthopedics;  Laterality: Right;  . Amputation Right 05/10/2015    Procedure: Right Below Knee Amputation;  Surgeon: Marybelle Killings, MD;  Location: [place];  Service: Orthopedics;  Laterality: Right;    There were no vitals filed for this visit.  Visit Diagnosis:  Abnormality of gait  Unsteadiness  Balance problems  Weakness  Stiffness due to immobility  Decreased functional activity tolerance  Status post below knee amputation of right lower extremity (HCC)  Status post above knee amputation of left lower extremity ([place])  Encounter for prosthetic gait training      Subjective Assessment - 01/03/16 0930    Subjective He has been so busy that he only has worn prostheses once per day but for 2 hours. No issues with skin or discomfort just working wear into his work schedule.    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 No/denies                         [facility]  Adult PT Treatment/Exercise - 01/03/16 0930    Transfers   Transfers Sit to Stand;Stand to Sit   Sit to Stand 4: Min assist;4: Min guard;With upper extremity assist;With armrests;From chair/3-in-1  to RW   Sit to Stand Details Verbal cues for technique;Verbal cues for sequencing;Verbal cues for precautions/safety;Verbal cues for safe use of DME/AE   Stand to Sit 4: Min guard;With upper extremity assist;With armrests;To chair/3-in-1  from RW   Stand to Sit Details (indicate cue type and reason) Verbal cues for technique;Verbal cues for sequencing;Verbal cues for precautions/safety;Verbal cues for safe use of DME/AE   Ambulation/Gait   Ambulation/Gait Yes   Ambulation/Gait Assistance 4: Min assist    Ambulation/Gait Assistance Details verbal cues with demo on changing sequence to RW, RLE, RW, LLE... to facilitate RW closer with upright trunk and improved fluency; PT demo turning around obstacles to right & left with proper adjustment to step length & pelvic orientation.    Ambulation Distance (Feet) 50 Feet  50' X 3   Assistive device Rolling walker;Prostheses   Gait Pattern Decreased stride length;Decreased hip/knee flexion - left;Trunk flexed;Step-through pattern   Ambulation Surface Indoor;Level   High Level Balance   High Level Balance Activities Negotitating around obstacles;Figure 8 turns   High Level Balance Comments RW & prostheses   Prosthetics   Prosthetic Care Comments  PT instructed in need to increase wear to improve function; TTA prosthesis rotates even with proper ply socks so PT set up appt with prosthetist on Friday, 01/07/16 after his PT appt to have pre-tibial pads added to socket;    Current prosthetic wear tolerance (days/week)  daily   Current prosthetic wear tolerance (#hours/day)  Increase to 3 hrs 2x/day   Current prosthetic weight-bearing tolerance (hours/day)  no c/o pain or discomfort with 5 minutes of gait.    Edema Transtibial Amputation pitting edema; Transfemoral Amputation no edema noted   Residual limb condition  right limb: intact with no issues;left limb: intact with no issues   Education Provided Residual limb care;Correct ply sock adjustment;Proper Donning;Proper weight-bearing schedule/adjustment;Proper wear schedule/adjustment  see prosthetic care   Person(s) Educated Patient;Spouse   Education Method Explanation;Demonstration;Verbal cues   Education Method Verbalized understanding;Verbal cues required;Needs further instruction                PT Education - 01/03/16 1020    Education provided Yes   Education Details assisting gait and use of belt; if balance loss too much to correct then lowering him to floor to decrease chance of injury.     Person(s) Educated Patient;Spouse   Methods Explanation   Comprehension Verbalized understanding          PT Short Term Goals - 12/29/15 1115    PT SHORT TERM GOAL #1   Title Patient donnes bilateral prostheses correctly and verbalizes proper cleaning of prostheses. (Target Date: 01/24/2016)   Time 1   Period Months   Status New   PT SHORT TERM GOAL #2   Title Patient tolerates wear of bilateral prostheses >8 hrs total per day without skin issues or limb tenderness. (Target Date: 01/24/2016)   Time 1   Period Months   Status New   PT SHORT TERM GOAL #3   Title Sit to/from stand transfers from w/c to RW with bilateral prostheses with supervision. (Target Date: 01/24/2016)   Time 1   Period Months   Status New   PT SHORT TERM GOAL #4   Title Patient reaches 7" anteriorly, laterally, across midline  and to floor with RW support with bilateral prostheses with supervision. (Target Date: 01/24/2016)   Time 1   Period Months   Status New   PT SHORT TERM GOAL #5   Title Patient ambulates 64' with RW & bilateral prostheses with minA. (Target Date: 01/24/2016)   Time 1   Period Months   Status New           PT Long Term Goals - 12/29/15 1115    PT LONG TERM GOAL #1   Title Patient verbalizes & demonstrates proper prosthetic care to enable safe use of prostheses. (Target Date: 03/17/2016)   Time 12   Period Weeks   Status New   PT LONG TERM GOAL #2   Title Patient tolerates wear of bilateral prostheses >90% of awake hours without skin issues or limb tenderness to enable function throughout his day.  (Target Date: 03/17/2016)   Time 12   Period Weeks   Status New   PT LONG TERM GOAL #3   Title Patient able to perform standing balance activities with UE support reaching >10" all directions & to floor modified independent.  (Target Date: 03/17/2016)   Time 12   Period Weeks   Status New   PT LONG TERM GOAL #4   Title Patient ambulates 300' with LRAD & bilateral prostheses  modified independent to enable community mobility.  (Target Date: 03/17/2016)   Time 12   Period Weeks   Status New   PT LONG TERM GOAL #5   Title Patient negotiates ramps, curbs & stairs with LRAD & bilateral prostheses modified independent to enable community mobility.  (Target Date: 03/17/2016)   Time 12   Period Weeks   Status New   Additional Long Term Goals   Additional Long Term Goals Yes   PT LONG TERM GOAL #6   Title Patient ambulates household around furniture with [facility] & bilateral prostheses modified independent.  (Target Date: 03/17/2016)   Time 12   Period Weeks   Status New               Plan - 01/03/16 1141    Clinical Impression Statement Patient improved sit to/from stand transfers from chairs with armrest to RW. Patient's gait required less assistance and was more stable. Patient & wife verbalize understanding of assisting gait and using belt so he can be lowered to floor in event of significant balance loss.    Pt will benefit from skilled therapeutic intervention in order to improve on the following deficits Abnormal gait;Decreased activity tolerance;Decreased balance;Decreased knowledge of precautions;Decreased knowledge of use of DME;Decreased mobility;Postural dysfunction;Prosthetic Dependency   Rehab Potential Good   PT Frequency 2x / week   PT Duration 12 weeks   PT Treatment/Interventions ADLs/Self Care Home Management;DME Instruction;Gait training;Stair training;Functional mobility training;Therapeutic activities;Therapeutic exercise;Balance training;Neuromuscular re-education;Patient/family education;Prosthetic Training   PT Next Visit Plan review prosthetic care, standing balance & gait with bilateral prostheses & RW.    Consulted and Agree with Plan of Care Patient;Family member/caregiver   Family Member Consulted wife        Problem List Patient Active Problem List   Diagnosis Date Noted  . Visit for wound care 07/19/2015  . Status post below  knee amputation of right lower extremity ([place]) 05/14/2015  . History of left above knee amputation ([place]) 05/14/2015  . Toe amputation status ([place]) 04/28/2015  . OSA (obstructive sleep apnea) 02/23/2015  . Type 2 diabetes mellitus with other circulatory complications ([place]) [id number]  . HLD (hyperlipidemia)  08/04/2014  . CKD (chronic kidney disease) 06/28/2014  . TIA (transient ischemic attack) 06/27/2014  . OBESITY, UNSPECIFIED 08/17/2010  . SYNCOPE 08/17/2010  . Hyperlipidemia 08/11/2010  . Essential hypertension 08/11/2010    Jamey Reas PT, DPT 01/03/2016, 11:45 AM  [facility] [address] [place], [place], [address] Phone: [phone]   Fax:  [phone]  Name: [name] MRN: [id number] Date of Birth: [birth year]

## 2016-01-07 ENCOUNTER — Encounter: Payer: Self-pay | Admitting: Physical Therapy

## 2016-01-07 ENCOUNTER — Ambulatory Visit: Payer: BLUE CROSS/BLUE SHIELD | Admitting: Physical Therapy

## 2016-01-07 DIAGNOSIS — R531 Weakness: Secondary | ICD-10-CM

## 2016-01-07 DIAGNOSIS — R269 Unspecified abnormalities of gait and mobility: Secondary | ICD-10-CM | POA: Diagnosis not present

## 2016-01-07 DIAGNOSIS — R2681 Unsteadiness on feet: Secondary | ICD-10-CM

## 2016-01-07 NOTE — Therapy (Signed)
[facility] [address] [place], [place], [address] Phone: [phone]   Fax:  [phone]  Physical Therapy Treatment  Patient Details  Name: [name] MRN: [id number] Date of Birth: 1951-09-04 Referring Provider: Rodell Perna, MD  Encounter Date: 01/07/2016      PT End of Session - 01/07/16 0856    Visit Number 4   Number of Visits 24   Date for PT Re-Evaluation 03/17/16   Authorization Type BCBS   PT Start Time 0801   PT Stop Time 0845   PT Time Calculation (min) 44 min   Equipment Utilized During Treatment Gait belt   Activity Tolerance Patient tolerated treatment well   Behavior During Therapy [facility] for tasks assessed/performed      Past Medical History  Diagnosis Date  . Hypertension   . Hyperlipidemia   . Necrosis ([place])     #2 nail   . Myocardial infarction ([place])     "mild" heart attack  . Poor circulation of extremity ([place])   . Sleep apnea     uses cpap, getting a new one  . Type 2 Diabetes mellitus     Type 2  . Arthritis   . Pneumonia     as a child  . CKD (chronic kidney disease) 06/28/2014    Sees Dr Florene Glen  . Cerebrovascular disease     MRI shows Right carotid inferior cavernours narrowing 75% and  50-75% stenosis of Cavernous and supraclinoi right side    Past Surgical History  Procedure Laterality Date  . Above knee leg amputation Left 2004  . Cataract extraction w/phaco  09/05/2012    Procedure: CATARACT EXTRACTION PHACO AND INTRAOCULAR LENS PLACEMENT (IOC);  Surgeon: Tonny Branch, MD;  [facility];  Service: Ophthalmology;  Laterality: Left;  CDE=5.45  . Cataract extraction w/phaco  10/03/2012    Procedure: CATARACT EXTRACTION PHACO AND INTRAOCULAR LENS PLACEMENT (IOC);  Surgeon: Tonny Branch, MD;  Location: [facility];  Service: Ophthalmology;  Laterality: Right;  CDE: 12.31  . Colonoscopy    . Amputation Right 04/28/2015    Procedure: AMPUTATION RAY, RIGHT 5TH TOE;  Surgeon: Marybelle Killings, MD;  Location: [facility];  Service: Orthopedics;  Laterality: Right;  . Amputation Right 05/10/2015    Procedure: Right Below Knee Amputation;  Surgeon: Marybelle Killings, MD;  Location: [facility];  Service: Orthopedics;  Laterality: Right;    There were no vitals filed for this visit.  Visit Diagnosis:  Abnormality of gait  Unsteadiness  Weakness      Subjective Assessment - 01/07/16 0808    Subjective He has been so busy that he only has worn prostheses 3 hours 2x/day. No issues with skin or discomfort just working wear into his work schedule. No falls.   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 No/denies         [facility] Adult PT Treatment/Exercise - 01/07/16 0847    Transfers   Transfers Sit to Stand;Stand to Sit   Sit to Stand 4: Min assist;4: Min guard;With upper extremity assist;With armrests;From chair/3-in-1   Sit to Stand Details Verbal cues for technique;Verbal cues for sequencing;Verbal cues for precautions/safety;Verbal cues for safe use of DME/AE   Stand to Sit  4: Min guard;With upper extremity assist;With armrests;To chair/3-in-1   Stand to Sit Details (indicate cue type and reason) Verbal cues for technique;Verbal cues for sequencing;Verbal cues for precautions/safety;Verbal cues for safe use of DME/AE   Number of Reps Other reps (comment)  4   Ambulation/Gait   Ambulation/Gait Yes   Ambulation/Gait Assistance 4: Min assist;4: Min guard   Ambulation/Gait Assistance Details Verbal cues for sequencing and safety awareness on turns.   Ambulation Distance (Feet) 60 Feet  60x2, 45x1   Assistive device Rolling walker;Prostheses   Gait Pattern Decreased stride length;Decreased hip/knee flexion - left;Trunk  flexed;Step-through pattern   Ambulation Surface Level;Indoor   Exercises   Exercises Other Exercises   Other Exercises  Sink exercises x 5 reps each exercise with supervision.   Prosthetics   Prosthetic Care Comments  Reinforced importance of working wear into daily routine to assist with improved funtion. Pt reports that his appt with [employer] got cancelled today due to prosthetist out sick. Despite sock ply adjustments in session, prosthesis continued to rotate with gait making gait unsafe. Call placed to [facility] and pt go go after session today for work in appt with prosthetist covering clinic today to have pretibial pads added for safety with gait at home. Pt verbalized understanding of this.                                   Current prosthetic wear tolerance (days/week)  daily   Current prosthetic wear tolerance (#hours/day)  Current 3 hours 2x/day   Current prosthetic weight-bearing tolerance (hours/day)  no c/o pain or discomfort with 5 minutes of gait but does denote pressure.    Edema None   Residual limb condition  right limb: intact with no issues;left limb: intact with no issues   Education Provided Residual limb care;Correct ply sock adjustment;Proper Donning   Person(s) Educated Patient   Education Method Explanation;Demonstration;Verbal cues   Education Method Verbalized understanding;Returned demonstration;Needs further instruction   Donning Prosthesis Supervision           PT Short Term Goals - 12/29/15 1115    PT SHORT TERM GOAL #1   Title Patient donnes bilateral prostheses correctly and verbalizes proper cleaning of prostheses. (Target Date: 01/24/2016)   Time 1   Period Months   Status New   PT SHORT TERM GOAL #2   Title Patient tolerates wear of bilateral prostheses >8 hrs total per day without skin issues or limb tenderness. (Target Date: 01/24/2016)   Time 1   Period Months   Status New   PT SHORT TERM GOAL #3   Title Sit to/from stand transfers from w/c to RW  with bilateral prostheses with supervision. (Target Date: 01/24/2016)   Time 1   Period Months   Status New   PT SHORT TERM GOAL #4   Title Patient reaches 7" anteriorly, laterally, across midline and to floor with RW support with bilateral prostheses with supervision. (Target Date: 01/24/2016)   Time 1   Period Months   Status New   PT SHORT TERM GOAL #5   Title Patient ambulates 68' with RW & bilateral prostheses with minA. (Target Date: 01/24/2016)   Time 1   Period Months   Status New          PT Long Term Goals - 12/29/15 1115    PT LONG TERM GOAL #1   Title Patient verbalizes & demonstrates proper prosthetic care  to enable safe use of prostheses. (Target Date: 03/17/2016)   Time 12   Period Weeks   Status New   PT LONG TERM GOAL #2   Title Patient tolerates wear of bilateral prostheses >90% of awake hours without skin issues or limb tenderness to enable function throughout his day.  (Target Date: 03/17/2016)   Time 12   Period Weeks   Status New   PT LONG TERM GOAL #3   Title Patient able to perform standing balance activities with UE support reaching >10" all directions & to floor modified independent.  (Target Date: 03/17/2016)   Time 12   Period Weeks   Status New   PT LONG TERM GOAL #4   Title Patient ambulates 300' with LRAD & bilateral prostheses modified independent to enable community mobility.  (Target Date: 03/17/2016)   Time 12   Period Weeks   Status New   PT LONG TERM GOAL #5   Title Patient negotiates ramps, curbs & stairs with LRAD & bilateral prostheses modified independent to enable community mobility.  (Target Date: 03/17/2016)   Time 12   Period Weeks   Status New   Additional Long Term Goals   Additional Long Term Goals Yes   PT LONG TERM GOAL #6   Title Patient ambulates household around furniture with [facility] & bilateral prostheses modified independent.  (Target Date: 03/17/2016)   Time 12   Period Weeks   Status New          Plan - 01/07/16  0858    Clinical Impression Statement Skilled session addressing prosthetic care and deficits in gait with prostheses and RW. Patient able to ambulate greater distance with fewer cues than in previous sessions. Patient continues to struggle with correct ply sock adjustment and sit<>stand transfer sequencing but verbalizes understanding of sequence and technique. Patient to see prosthetist today to have adjustment to compensate for residual limb shrinking. Patient is making steady progress toward goals.   Pt will benefit from skilled therapeutic intervention in order to improve on the following deficits Abnormal gait;Decreased activity tolerance;Decreased balance;Decreased knowledge of precautions;Decreased knowledge of use of DME;Decreased mobility;Postural dysfunction;Prosthetic Dependency   Rehab Potential Good   PT Frequency 2x / week   PT Duration 12 weeks   PT Treatment/Interventions ADLs/Self Care Home Management;DME Instruction;Gait training;Stair training;Functional mobility training;Therapeutic activities;Therapeutic exercise;Balance training;Neuromuscular re-education;Patient/family education;Prosthetic Training   PT Next Visit Plan Follow up with visit to prosthetist; review prosthetic care, standing balance & gait with bilateral prostheses & RW.    PT Home Exercise Plan Patient demo's correct technique with sink exercises.   Consulted and Agree with Plan of Care Patient;Family member/caregiver        Problem List Patient Active Problem List   Diagnosis Date Noted  . Visit for wound care 07/19/2015  . Status post below knee amputation of right lower extremity ([place]) 05/14/2015  . History of left above knee amputation ([place]) 05/14/2015  . Toe amputation status ([place]) 04/28/2015  . OSA (obstructive sleep apnea) 02/23/2015  . Type 2 diabetes mellitus with other circulatory complications ([place]) 99991111  . HLD (hyperlipidemia) 08/04/2014  . CKD (chronic kidney disease) 06/28/2014  .  TIA (transient ischemic attack) 06/27/2014  . OBESITY, UNSPECIFIED 08/17/2010  . SYNCOPE 08/17/2010  . Hyperlipidemia 08/11/2010  . Essential hypertension 08/11/2010    Bayard Beaver, [place] 01/07/2016, 9:10 AM  [facility] [address] [place], [place], [address] Phone: [phone]   Fax:  [phone]  Name: [place]  DARELD FELCH MRN: [id number] Date of Birth: [birth year]  This note has been reviewed and edited by supervising CI.  Willow Ora, PTA, [facility] [address], [place],  [phone] 01/07/2016, 1:07 PM

## 2016-01-12 ENCOUNTER — Ambulatory Visit: Payer: BLUE CROSS/BLUE SHIELD | Admitting: Physical Therapy

## 2016-01-12 ENCOUNTER — Encounter: Payer: Self-pay | Admitting: Physical Therapy

## 2016-01-12 ENCOUNTER — Other Ambulatory Visit: Payer: Self-pay | Admitting: "Endocrinology

## 2016-01-12 DIAGNOSIS — Z4789 Encounter for other orthopedic aftercare: Secondary | ICD-10-CM

## 2016-01-12 DIAGNOSIS — R2681 Unsteadiness on feet: Secondary | ICD-10-CM

## 2016-01-12 DIAGNOSIS — R269 Unspecified abnormalities of gait and mobility: Secondary | ICD-10-CM

## 2016-01-12 DIAGNOSIS — R2689 Other abnormalities of gait and mobility: Secondary | ICD-10-CM

## 2016-01-12 LAB — BASIC METABOLIC PANEL
BUN: 29 mg/dL — AB (ref 7–25)
CALCIUM: 8.9 mg/dL (ref 8.6–10.3)
CHLORIDE: 103 mmol/L (ref 98–110)
CO2: 25 mmol/L (ref 20–31)
CREATININE: 1.65 mg/dL — AB (ref 0.70–1.25)
GLUCOSE: 119 mg/dL — AB (ref 65–99)
Potassium: 4.2 mmol/L (ref 3.5–5.3)
Sodium: 139 mmol/L (ref 135–146)

## 2016-01-12 NOTE — Therapy (Signed)
[facility] [address] [place], [place], [address] Phone: [phone]   Fax:  [phone]  Physical Therapy Treatment  Patient Details  Name: [name] MRN: [id number] Date of Birth: June 02, 1951 Referring Provider: Rodell Perna, MD  Encounter Date: 01/12/2016      PT End of Session - 01/12/16 0938    Visit Number 5   Number of Visits 24   Date for PT Re-Evaluation 03/17/16   Authorization Type BCBS   PT Start Time [phone]  Patient arrived 10 minutes late.   PT Stop Time 0931   PT Time Calculation (min) 36 min   Equipment Utilized During Treatment Gait belt   Activity Tolerance Patient tolerated treatment well   Behavior During Therapy WFL for tasks assessed/performed      Past Medical History  Diagnosis Date  . Hypertension   . Hyperlipidemia   . Necrosis ([place])     #2 nail   . Myocardial infarction ([place])     "mild" heart attack  . Poor circulation of extremity ([place])   . Sleep apnea     uses cpap, getting a new one  . Type 2 Diabetes mellitus     Type 2  . Arthritis   . Pneumonia     as a child  . CKD (chronic kidney disease) 06/28/2014    Sees Dr Florene Glen  . Cerebrovascular disease     MRI shows Right carotid inferior cavernours narrowing 75% and  50-75% stenosis of Cavernous and supraclinoi right side    Past Surgical History  Procedure Laterality Date  . Above knee leg amputation Left 2004  . Cataract extraction w/phaco  09/05/2012    Procedure: CATARACT EXTRACTION PHACO AND INTRAOCULAR LENS PLACEMENT (IOC);  Surgeon: Tonny Branch, MD;  Location: [facility];  Service: Ophthalmology;  Laterality: Left;  CDE=5.45  . Cataract extraction w/phaco  10/03/2012    Procedure: CATARACT EXTRACTION PHACO AND INTRAOCULAR LENS PLACEMENT (IOC);  Surgeon: Tonny Branch, MD;  Location: [facility];  Service: Ophthalmology;  Laterality: Right;  CDE: 12.31  . Colonoscopy    . Amputation Right 04/28/2015    Procedure: AMPUTATION RAY,  RIGHT 5TH TOE;  Surgeon: Marybelle Killings, MD;  Location: [facility];  Service: Orthopedics;  Laterality: Right;  . Amputation Right 05/10/2015    Procedure: Right Below Knee Amputation;  Surgeon: Marybelle Killings, MD;  Location: [facility];  Service: Orthopedics;  Laterality: Right;    There were no vitals filed for this visit.  Visit Diagnosis:  Abnormality of gait  Unsteadiness  Balance problems  Encounter for prosthetic gait training      Subjective Assessment - 01/12/16 0859    Subjective He has been so busy that he only has worn prostheses 3-4 hours 1-2x/day. No issues with skin or discomfort just working wear into his work schedule. No falls. Patient reports visit to prosthetist went well and is more successful after adding pads to prosthesis.   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 No/denies          [facility] Adult PT Treatment/Exercise - 01/12/16 0901    Transfers   Transfers Sit to Stand;Stand to Sit   Sit to Stand 4: Min assist;4: Min guard;With upper extremity  assist;With armrests;From chair/3-in-1   Sit to Stand Details Verbal cues for technique;Verbal cues for sequencing;Verbal cues for precautions/safety;Verbal cues for safe use of DME/AE   Stand to Sit 4: Min guard;With upper extremity assist;With armrests;To chair/3-in-1   Stand to Sit Details (indicate cue type and reason) Verbal cues for technique;Verbal cues for sequencing;Verbal cues for precautions/safety;Verbal cues for safe use of DME/AE   Number of Reps Other reps (comment)  4   Ambulation/Gait   Ambulation/Gait Yes   Ambulation/Gait Assistance 4: Min guard   Ambulation/Gait Assistance Details Verbal cues for weight shifting, safety awareness on turns.     Ambulation Distance (Feet) 115 Feet   Assistive device Rolling walker;Prostheses   Gait Pattern Decreased stride length;Decreased hip/knee flexion - left;Trunk flexed;Step-through pattern   Ambulation Surface Level;Indoor   Neuro Re-ed    Neuro Re-ed Details  // Bars feet apart no UE support eyes open no head movement, head nod, head shake, for 30 seconds each min/guard to minA to decrease fall risk.   Prosthetics   Prosthetic Care Comments  Reinforced importance of working wear into daily routine to assist with improved funtion. Pt reports visit to prosthetist went well. Patient continues to struggle with proper ply sock adjustment (uses too few).   Current prosthetic wear tolerance (days/week)  daily   Current prosthetic wear tolerance (#hours/day)  Current 3-4 hours 1-2x/day  Increasing to 4 hours 2x/day   Current prosthetic weight-bearing tolerance (hours/day)  no c/o pain, pressure, or discomfort with 10 minutes of gait but does denote fatigue.    Edema None   Residual limb condition  right limb: intact with no issues;left limb: intact with no issues   Education Provided Residual limb care;Correct ply sock adjustment;Proper Donning   Person(s) Educated Patient;Spouse   Education Method Explanation;Verbal cues   Education Method Verbalized understanding;Returned demonstration;Needs further instruction   Donning Prosthesis Supervision     Prosthetic Training - Ambulation with RW and prostheses: Figure-8 pattern min/guard x 2 full reps with verbal cues for appropriate stride length to decrease fall risk and instruct patient in negotiating sharp turns.      PT Short Term Goals - 12/29/15 1115    PT SHORT TERM GOAL #1   Title Patient donnes bilateral prostheses correctly and verbalizes proper cleaning of prostheses. (Target Date: 01/24/2016)   Time 1   Period Months   Status New   PT SHORT TERM GOAL #2   Title Patient tolerates wear of bilateral prostheses >8 hrs total per day without  skin issues or limb tenderness. (Target Date: 01/24/2016)   Time 1   Period Months   Status New   PT SHORT TERM GOAL #3   Title Sit to/from stand transfers from w/c to RW with bilateral prostheses with supervision. (Target Date: 01/24/2016)   Time 1   Period Months   Status New   PT SHORT TERM GOAL #4   Title Patient reaches 7" anteriorly, laterally, across midline and to floor with RW support with bilateral prostheses with supervision. (Target Date: 01/24/2016)   Time 1   Period Months   Status New   PT SHORT TERM GOAL #5   Title Patient ambulates 46' with RW & bilateral prostheses with minA. (Target Date: 01/24/2016)   Time 1   Period Months   Status New           PT Long Term Goals - 12/29/15 1115    PT LONG TERM GOAL #1   Title Patient verbalizes & demonstrates  proper prosthetic care to enable safe use of prostheses. (Target Date: 03/17/2016)   Time 12   Period Weeks   Status New   PT LONG TERM GOAL #2   Title Patient tolerates wear of bilateral prostheses >90% of awake hours without skin issues or limb tenderness to enable function throughout his day.  (Target Date: 03/17/2016)   Time 12   Period Weeks   Status New   PT LONG TERM GOAL #3   Title Patient able to perform standing balance activities with UE support reaching >10" all directions & to floor modified independent.  (Target Date: 03/17/2016)   Time 12   Period Weeks   Status New   PT LONG TERM GOAL #4   Title Patient ambulates 300' with LRAD & bilateral prostheses modified independent to enable community mobility.  (Target Date: 03/17/2016)   Time 12   Period Weeks   Status New   PT LONG TERM GOAL #5   Title Patient negotiates ramps, curbs & stairs with LRAD & bilateral prostheses modified independent to enable community mobility.  (Target Date: 03/17/2016)   Time 12   Period Weeks   Status New   Additional Long Term Goals   Additional Long Term Goals Yes   PT LONG TERM GOAL #6   Title Patient ambulates  household around furniture with [facility] & bilateral prostheses modified independent.  (Target Date: 03/17/2016)   Time 12   Period Weeks   Status New          Plan - 01/12/16 [id number]    Clinical Impression Statement Skilled session addressing prosthetic care, ambulation with RW and prostheses, and static standing balance. Patient able to ambulate significantly farther than previous sessions with fewer cues and no pressure in residual limbs compared with significant pressure in previous sessions. Patient believes prosthetic adjustment to add pads has helped significantly. Patient is making consistent progress toward goals.   Pt will benefit from skilled therapeutic intervention in order to improve on the following deficits Abnormal gait;Decreased activity tolerance;Decreased balance;Decreased knowledge of precautions;Decreased knowledge of use of DME;Decreased mobility;Postural dysfunction;Prosthetic Dependency   Rehab Potential Good   PT Frequency 2x / week   PT Duration 12 weeks   PT Treatment/Interventions ADLs/Self Care Home Management;DME Instruction;Gait training;Stair training;Functional mobility training;Therapeutic activities;Therapeutic exercise;Balance training;Neuromuscular re-education;Patient/family education;Prosthetic Training   PT Next Visit Plan Review prosthetic care, Continue with gait with prostheses/RW, navigational challenges (figure 8s, etc), and static standing balance.   Consulted and Agree with Plan of Care Patient;Family member/caregiver        Problem List Patient Active Problem List   Diagnosis Date Noted  . Visit for wound care 07/19/2015  . Status post below knee amputation of right lower extremity ([place]) 05/14/2015  . History of left above knee amputation ([place]) 05/14/2015  . Toe amputation status ([place]) 04/28/2015  . OSA (obstructive sleep apnea) 02/23/2015  . Type 2 diabetes mellitus with other circulatory complications ([place]) [id number]  . HLD (hyperlipidemia)  08/04/2014  . CKD (chronic kidney disease) 06/28/2014  . TIA (transient ischemic attack) 06/27/2014  . OBESITY, UNSPECIFIED 08/17/2010  . SYNCOPE 08/17/2010  . Hyperlipidemia 08/11/2010  . Essential hypertension 08/11/2010    Bayard Beaver, [place] 01/12/2016, 9:46 AM  [facility] [address] [place], [place], [address] Phone: [phone]   Fax:  [phone]  Name: [name] MRN: [id number] Date of Birth: 16-Apr-1951  This note has been reviewed and edited by supervising CI.  Willow Ora, PTA,  [facility] [address] [place], [place] [address] [phone] 01/13/2016, 2:54 PM

## 2016-01-13 LAB — HEMOGLOBIN A1C
Hgb A1c MFr Bld: 5.9 % — ABNORMAL HIGH (ref <5.7)
Mean Plasma Glucose: 123 mg/dL — ABNORMAL HIGH (ref <117)

## 2016-01-14 ENCOUNTER — Encounter: Payer: Self-pay | Admitting: Physical Therapy

## 2016-01-14 ENCOUNTER — Ambulatory Visit: Payer: BLUE CROSS/BLUE SHIELD | Admitting: Physical Therapy

## 2016-01-14 DIAGNOSIS — R269 Unspecified abnormalities of gait and mobility: Secondary | ICD-10-CM | POA: Diagnosis not present

## 2016-01-14 DIAGNOSIS — R2681 Unsteadiness on feet: Secondary | ICD-10-CM

## 2016-01-14 DIAGNOSIS — R6889 Other general symptoms and signs: Secondary | ICD-10-CM

## 2016-01-14 DIAGNOSIS — Z4789 Encounter for other orthopedic aftercare: Secondary | ICD-10-CM

## 2016-01-14 NOTE — Therapy (Signed)
[facility] [address] [place], [place], [address] Phone: [phone]   Fax:  [phone]  Physical Therapy Treatment  Patient Details  Name: [name] MRN: [id number] Date of Birth: 07/21/1951 Referring Provider: Rodell Perna, MD  Encounter Date: 01/14/2016      PT End of Session - 01/14/16 1106    Visit Number 5   Number of Visits 24   Date for PT Re-Evaluation 03/17/16   Authorization Type BCBS   PT Start Time 0931   PT Stop Time 1015   PT Time Calculation (min) 44 min   Equipment Utilized During Treatment Gait belt   Activity Tolerance Patient tolerated treatment well   Behavior During Therapy [facility] for tasks assessed/performed      Past Medical History  Diagnosis Date  . Hypertension   . Hyperlipidemia   . Necrosis ([place])     #2 nail   . Myocardial infarction ([place])     "mild" heart attack  . Poor circulation of extremity ([place])   . Sleep apnea     uses cpap, getting a new one  . Type 2 Diabetes mellitus     Type 2  . Arthritis   . Pneumonia     as a child  . CKD (chronic kidney disease) 06/28/2014    Sees Dr Florene Glen  . Cerebrovascular disease     MRI shows Right carotid inferior cavernours narrowing 75% and  50-75% stenosis of Cavernous and supraclinoi right side    Past Surgical History  Procedure Laterality Date  . Above knee leg amputation Left 2004  . Cataract extraction w/phaco  09/05/2012    Procedure: CATARACT EXTRACTION PHACO AND INTRAOCULAR LENS PLACEMENT (IOC);  Surgeon: Tonny Branch, MD;  [facility];  Service: Ophthalmology;  Laterality: Left;  CDE=5.45  . Cataract extraction w/phaco  10/03/2012    Procedure: CATARACT EXTRACTION PHACO AND INTRAOCULAR LENS PLACEMENT (IOC);  Surgeon: Tonny Branch, MD;  Location: [facility];  Service: Ophthalmology;  Laterality: Right;  CDE: 12.31  . Colonoscopy    . Amputation Right 04/28/2015    Procedure: AMPUTATION RAY, RIGHT 5TH TOE;  Surgeon: Marybelle Killings, MD;  Location: [facility];  Service: Orthopedics;  Laterality: Right;  . Amputation Right 05/10/2015    Procedure: Right Below Knee Amputation;  Surgeon: Marybelle Killings, MD;  Location: [facility];  Service: Orthopedics;  Laterality: Right;    There were no vitals filed for this visit.  Visit Diagnosis:  Abnormality of gait  Unsteadiness  Encounter for prosthetic gait training  Decreased functional activity tolerance      Subjective Assessment - 01/14/16 0936    Subjective He has been so busy that he only has worn prostheses 4 hours 2x/day. No issues with skin or discomfort just working wear into his work schedule. No falls.   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 No/denies          [facility] Adult PT Treatment/Exercise - 01/14/16 0937    Ambulation/Gait   Ambulation/Gait Yes   Ambulation/Gait Assistance 4: Min guard   Ambulation/Gait Assistance Details Verbal cues for weight shifting, forward gaze, and correcting flexed posture.   Ambulation Distance (Feet) 230 Feet  x2   Assistive device  Rolling walker;Prostheses   Gait Pattern Decreased stride length;Decreased hip/knee flexion - left;Trunk flexed;Step-through pattern   Ambulation Surface Level;Indoor   Prosthetics   Prosthetic Care Comments  Reinforced importance of maintaining wear schedule which patient has been successful with this week. Patient continues to struggle with proper ply sock adjustment (too few). Pt c/o no pain or pressure with weight bearing despite too few socks.   Current prosthetic wear tolerance (days/week)  daily   Current prosthetic wear tolerance (#hours/day)  Current 4 hours 2x/day   Current prosthetic weight-bearing tolerance (hours/day)   no c/o pain, pressure, or discomfort with 10 minutes of gait but does denote fatigue.    Edema None   Residual limb condition  right limb: intact with no issues;left limb: intact with no issues   Education Provided Residual limb care;Proper Donning;Skin check;Correct ply sock adjustment   Person(s) Educated Patient;Spouse   Education Method Explanation   Education Method Verbalized understanding   Donning Prosthesis Supervision          PT Short Term Goals - 12/29/15 1115    PT SHORT TERM GOAL #1   Title Patient donnes bilateral prostheses correctly and verbalizes proper cleaning of prostheses. (Target Date: 01/24/2016)   Time 1   Period Months   Status New   PT SHORT TERM GOAL #2   Title Patient tolerates wear of bilateral prostheses >8 hrs total per day without skin issues or limb tenderness. (Target Date: 01/24/2016)   Time 1   Period Months   Status New   PT SHORT TERM GOAL #3   Title Sit to/from stand transfers from w/c to RW with bilateral prostheses with supervision. (Target Date: 01/24/2016)   Time 1   Period Months   Status New   PT SHORT TERM GOAL #4   Title Patient reaches 7" anteriorly, laterally, across midline and to floor with RW support with bilateral prostheses with supervision. (Target Date: 01/24/2016)   Time 1   Period Months   Status New   PT SHORT TERM GOAL #5   Title Patient ambulates 42' with RW & bilateral prostheses with minA. (Target Date: 01/24/2016)   Time 1   Period Months   Status New           PT Long Term Goals - 12/29/15 1115    PT LONG TERM GOAL #1   Title Patient verbalizes & demonstrates proper prosthetic care to enable safe use of prostheses. (Target Date: 03/17/2016)   Time 12   Period Weeks   Status New   PT LONG TERM GOAL #2   Title Patient tolerates wear of bilateral prostheses >90% of awake hours without skin issues or limb tenderness to enable function throughout his day.  (Target Date: 03/17/2016)   Time 12   Period Weeks    Status New   PT LONG TERM GOAL #3   Title Patient able to perform standing balance activities with UE support reaching >10" all directions & to floor modified independent.  (Target Date: 03/17/2016)   Time 12   Period Weeks   Status New   PT LONG TERM GOAL #4   Title Patient ambulates 300' with LRAD & bilateral prostheses modified independent to enable community mobility.  (Target Date: 03/17/2016)   Time 12   Period Weeks   Status New   PT LONG TERM GOAL #5   Title Patient negotiates ramps, curbs & stairs with LRAD & bilateral prostheses modified independent to enable community mobility.  (Target Date: 03/17/2016)  Time 12   Period Weeks   Status New   Additional Long Term Goals   Additional Long Term Goals Yes   PT LONG TERM GOAL #6   Title Patient ambulates household around furniture with [facility] & bilateral prostheses modified independent.  (Target Date: 03/17/2016)   Time 12   Period Weeks   Status New          Plan - 01/14/16 1106    Clinical Impression Statement Skilled session addressing prosthetic care and prosthetic gait training with RW with patient achieving double the distance ambulated of previous sessions with fewer verbal cues required. Patient appears to be taking good care of residual limb with regard to drying.   Pt will benefit from skilled therapeutic intervention in order to improve on the following deficits Abnormal gait;Decreased activity tolerance;Decreased balance;Decreased knowledge of precautions;Decreased knowledge of use of DME;Decreased mobility;Postural dysfunction;Prosthetic Dependency   Rehab Potential Good   PT Frequency 2x / week   PT Duration 12 weeks   PT Treatment/Interventions ADLs/Self Care Home Management;DME Instruction;Gait training;Stair training;Functional mobility training;Therapeutic activities;Therapeutic exercise;Balance training;Neuromuscular re-education;Patient/family education;Prosthetic Training   PT Next Visit Plan Review prosthetic  care, Continue with gait with prostheses/RW, navigational challenges (figure 8s, etc), and static standing balance.   Consulted and Agree with Plan of Care Patient;Family member/caregiver      Problem List Patient Active Problem List   Diagnosis Date Noted  . Visit for wound care 07/19/2015  . Status post below knee amputation of right lower extremity ([place]) 05/14/2015  . History of left above knee amputation ([place]) 05/14/2015  . Toe amputation status ([place]) 04/28/2015  . OSA (obstructive sleep apnea) 02/23/2015  . Type 2 diabetes mellitus with other circulatory complications ([place]) 99991111  . HLD (hyperlipidemia) 08/04/2014  . CKD (chronic kidney disease) 06/28/2014  . TIA (transient ischemic attack) 06/27/2014  . OBESITY, UNSPECIFIED 08/17/2010  . SYNCOPE 08/17/2010  . Hyperlipidemia 08/11/2010  . Essential hypertension 08/11/2010    Bayard Beaver, [place] 01/14/2016, 12:52 PM  [facility] [address] [place], [place], [address] Phone: [phone]   Fax:  [phone]  Name: [name] MRN: [id number] Date of Birth: 27-Jul-1951  This note has been reviewed and edited by supervising CI.  Willow Ora, PTA, [facility] [address] [place], [place] [address] [phone] 01/16/2016, 10:45 PM

## 2016-01-17 ENCOUNTER — Encounter: Payer: Self-pay | Admitting: Physical Therapy

## 2016-01-17 ENCOUNTER — Ambulatory Visit: Payer: BLUE CROSS/BLUE SHIELD | Admitting: Physical Therapy

## 2016-01-17 DIAGNOSIS — R269 Unspecified abnormalities of gait and mobility: Secondary | ICD-10-CM

## 2016-01-17 DIAGNOSIS — R2681 Unsteadiness on feet: Secondary | ICD-10-CM

## 2016-01-17 DIAGNOSIS — Z4789 Encounter for other orthopedic aftercare: Secondary | ICD-10-CM

## 2016-01-17 NOTE — Therapy (Signed)
[facility] [address] [place], [place], [address] Phone: [phone]   Fax:  [phone]  Physical Therapy Treatment  Patient Details  Name: SHIVANG MCRIGHT MRN: [id number] Date of Birth: [birth year] Referring Provider: Rodell Perna, MD  Encounter Date: 01/17/2016      PT End of Session - 01/17/16 1629    Visit Number 6   Number of Visits 24   Date for PT Re-Evaluation 03/17/16   Authorization Type BCBS   PT Start Time 1446   PT Stop Time 1529   PT Time Calculation (min) 43 min   Equipment Utilized During Treatment Gait belt   Activity Tolerance Patient tolerated treatment well   Behavior During Therapy [facility] for tasks assessed/performed      Past Medical History  Diagnosis Date  . Hypertension   . Hyperlipidemia   . Necrosis ([place])     #2 nail   . Myocardial infarction ([place])     "mild" heart attack  . Poor circulation of extremity ([place])   . Sleep apnea     uses cpap, getting a new one  . Type 2 Diabetes mellitus     Type 2  . Arthritis   . Pneumonia     as a child  . CKD (chronic kidney disease) 06/28/2014    Sees Dr Florene Glen  . Cerebrovascular disease     MRI shows Right carotid inferior cavernours narrowing 75% and  50-75% stenosis of Cavernous and supraclinoi right side    Past Surgical History  Procedure Laterality Date  . Above knee leg amputation Left 2004  . Cataract extraction w/phaco  09/05/2012    Procedure: CATARACT EXTRACTION PHACO AND INTRAOCULAR LENS PLACEMENT (IOC);  Surgeon: Tonny Branch, MD;  Location: [facility];  Service: Ophthalmology;  Laterality: Left;  CDE=5.45  . Cataract extraction w/phaco  10/03/2012    Procedure: CATARACT EXTRACTION PHACO AND INTRAOCULAR LENS PLACEMENT (IOC);  Surgeon: Tonny Branch, MD;  Location: [facility];  Service: Ophthalmology;  Laterality: Right;  CDE: 12.31  . Colonoscopy    . Amputation Right 04/28/2015    Procedure: AMPUTATION RAY, RIGHT 5TH TOE;  Surgeon: Marybelle Killings, MD;  Location: [facility];  Service: Orthopedics;  Laterality: Right;  . Amputation Right 05/10/2015    Procedure: Right Below Knee Amputation;  Surgeon: Marybelle Killings, MD;  Location: [facility];  Service: Orthopedics;  Laterality: Right;    There were no vitals filed for this visit.  Visit Diagnosis:  Abnormality of gait  Unsteadiness  Encounter for prosthetic gait training      Subjective Assessment - 01/17/16 1451    Subjective Patient reports he has worn prostheses 4 hours 2x/day. No issues with skin or discomfort just working wear into his work schedule. No falls.   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 No/denies         [facility] Adult PT Treatment/Exercise - 01/17/16 1452    Ambulation/Gait   Ambulation/Gait Yes   Ambulation/Gait Assistance 4: Min guard   Ambulation/Gait Assistance Details Verbal cues for weight shift, forward gaze, and correcting flexed posture.   Ambulation Distance (Feet) 210 Feet   Assistive device Rolling walker;Prostheses   Gait Pattern Decreased stride length;Decreased hip/knee flexion - left;Trunk  flexed;Step-through pattern   Ambulation Surface Level;Indoor   Gait Comments Gait around cones (6) interweaving between them with walker/prosthesies. Down and back x2 min/guard. Step-overs black foam strips (3 of varied heights) down/back x 4 in // bars with verbal cues for sequencing and technique.   Prosthetics   Prosthetic Care Comments  Reinforced importance of maintaining wear schedule which patient has been successful with this week. Pt without complaints with wear at current times. Will increase when pt is fully consistent with current wear schedule.                             Current  prosthetic wear tolerance (days/week)  daily   Current prosthetic wear tolerance (#hours/day)  Current 4 hours 2x/day   Current prosthetic weight-bearing tolerance (hours/day)  no c/o pain, pressure, or discomfort with 10 minutes of gait but does denote fatigue.    Edema None   Residual limb condition  right limb: intact with no issues;left limb: intact with no issues   Education Provided Residual limb care;Proper Donning;Skin check   Person(s) Educated Patient;Spouse   Education Method Explanation   Education Method Verbalized understanding;Returned Doctor, general practice Prosthesis Supervision          PT Short Term Goals - 12/29/15 1115    PT SHORT TERM GOAL #1   Title Patient donnes bilateral prostheses correctly and verbalizes proper cleaning of prostheses. (Target Date: 01/24/2016)   Time 1   Period Months   Status New   PT SHORT TERM GOAL #2   Title Patient tolerates wear of bilateral prostheses >8 hrs total per day without skin issues or limb tenderness. (Target Date: 01/24/2016)   Time 1   Period Months   Status New   PT SHORT TERM GOAL #3   Title Sit to/from stand transfers from w/c to RW with bilateral prostheses with supervision. (Target Date: 01/24/2016)   Time 1   Period Months   Status New   PT SHORT TERM GOAL #4   Title Patient reaches 7" anteriorly, laterally, across midline and to floor with RW support with bilateral prostheses with supervision. (Target Date: 01/24/2016)   Time 1   Period Months   Status New   PT SHORT TERM GOAL #5   Title Patient ambulates 109' with RW & bilateral prostheses with minA. (Target Date: 01/24/2016)   Time 1   Period Months   Status New           PT Long Term Goals - 12/29/15 1115    PT LONG TERM GOAL #1   Title Patient verbalizes & demonstrates proper prosthetic care to enable safe use of prostheses. (Target Date: 03/17/2016)   Time 12   Period Weeks   Status New   PT LONG TERM GOAL #2   Title Patient tolerates wear of  bilateral prostheses >90% of awake hours without skin issues or limb tenderness to enable function throughout his day.  (Target Date: 03/17/2016)   Time 12   Period Weeks   Status New   PT LONG TERM GOAL #3   Title Patient able to perform standing balance activities with UE support reaching >10" all directions & to floor modified independent.  (Target Date: 03/17/2016)   Time 12   Period Weeks   Status New   PT LONG TERM GOAL #4   Title Patient ambulates 300' with LRAD & bilateral prostheses modified independent to enable community mobility.  (Target Date:  03/17/2016)   Time 12   Period Weeks   Status New   PT LONG TERM GOAL #5   Title Patient negotiates ramps, curbs & stairs with LRAD & bilateral prostheses modified independent to enable community mobility.  (Target Date: 03/17/2016)   Time 12   Period Weeks   Status New   Additional Long Term Goals   Additional Long Term Goals Yes   PT LONG TERM GOAL #6   Title Patient ambulates household around furniture with [facility] & bilateral prostheses modified independent.  (Target Date: 03/17/2016)   Time 12   Period Weeks   Status New         Plan - 01/17/16 1629    Clinical Impression Statement Skilled session addressing prosthetic gait training including obstacles such sharp turns around cones and stepping over foam strips. Patient verbalizes increased comfort with obstacles and is enthusiastic about increasing challenges. Patient is making consistent progress toward goals.   Pt will benefit from skilled therapeutic intervention in order to improve on the following deficits Abnormal gait;Decreased activity tolerance;Decreased balance;Decreased knowledge of precautions;Decreased knowledge of use of DME;Decreased mobility;Postural dysfunction;Prosthetic Dependency   Rehab Potential Good   PT Frequency 2x / week   PT Duration 12 weeks   PT Treatment/Interventions ADLs/Self Care Home Management;DME Instruction;Gait training;Stair  training;Functional mobility training;Therapeutic activities;Therapeutic exercise;Balance training;Neuromuscular re-education;Patient/family education;Prosthetic Training   PT Next Visit Plan assess STGs next session. Review prosthetic care, Continue with gait with prostheses/RW, navigational challenges (figure 8s, etc), and static standing balance. Consider increase in wear time next session if appropriate.    Consulted and Agree with Plan of Care Patient;Family member/caregiver       Problem List Patient Active Problem List   Diagnosis Date Noted  . Visit for wound care 07/19/2015  . Status post below knee amputation of right lower extremity ([place]) 05/14/2015  . History of left above knee amputation ([place]) 05/14/2015  . Toe amputation status ([place]) 04/28/2015  . OSA (obstructive sleep apnea) 02/23/2015  . Type 2 diabetes mellitus with other circulatory complications ([place]) 99991111  . HLD (hyperlipidemia) 08/04/2014  . CKD (chronic kidney disease) 06/28/2014  . TIA (transient ischemic attack) 06/27/2014  . OBESITY, UNSPECIFIED 08/17/2010  . SYNCOPE 08/17/2010  . Hyperlipidemia 08/11/2010  . Essential hypertension 08/11/2010    Bayard Beaver, SPTA 01/17/2016, 4:32 PM  [facility] [address] [place], [place], [address] Phone: [phone]   Fax:  [phone]  Name: DEBRON FRITCH MRN: [id number] Date of Birth: 17-Jun-1951  This note has been reviewed and edited by supervising CI.  Willow Ora, PTA, [facility] [address] [place], [place] [address] [phone] 01/18/2016, 2:24 PM

## 2016-01-19 ENCOUNTER — Encounter: Payer: Self-pay | Admitting: "Endocrinology

## 2016-01-19 ENCOUNTER — Ambulatory Visit (INDEPENDENT_AMBULATORY_CARE_PROVIDER_SITE_OTHER): Payer: BLUE CROSS/BLUE SHIELD | Admitting: "Endocrinology

## 2016-01-19 VITALS — BP 113/73 | HR 76 | Ht 70.0 in

## 2016-01-19 DIAGNOSIS — E1159 Type 2 diabetes mellitus with other circulatory complications: Secondary | ICD-10-CM

## 2016-01-19 DIAGNOSIS — I10 Essential (primary) hypertension: Secondary | ICD-10-CM | POA: Diagnosis not present

## 2016-01-19 DIAGNOSIS — E785 Hyperlipidemia, unspecified: Secondary | ICD-10-CM

## 2016-01-19 NOTE — Patient Instructions (Signed)

## 2016-01-19 NOTE — Progress Notes (Signed)
Subjective:    Patient ID: [name], male    DOB: 1951/01/31, PCP Vic Blackbird, MD   Past Medical History  Diagnosis Date  . Hypertension   . Hyperlipidemia   . Necrosis ([place])     #2 nail   . Myocardial infarction ([place])     "mild" heart attack  . Poor circulation of extremity ([place])   . Sleep apnea     uses cpap, getting a new one  . Type 2 Diabetes mellitus     Type 2  . Arthritis   . Pneumonia     as a child  . CKD (chronic kidney disease) 06/28/2014    Sees Dr Florene Glen  . Cerebrovascular disease     MRI shows Right carotid inferior cavernours narrowing 75% and  50-75% stenosis of Cavernous and supraclinoi right side   Past Surgical History  Procedure Laterality Date  . Above knee leg amputation Left 2004  . Cataract extraction w/phaco  09/05/2012    Procedure: CATARACT EXTRACTION PHACO AND INTRAOCULAR LENS PLACEMENT (IOC);  Surgeon: Tonny Branch, MD;  [facility];  Service: Ophthalmology;  Laterality: Left;  CDE=5.45  . Cataract extraction w/phaco  10/03/2012    Procedure: CATARACT EXTRACTION PHACO AND INTRAOCULAR LENS PLACEMENT (IOC);  Surgeon: Tonny Branch, MD;  Location: [facility];  Service: Ophthalmology;  Laterality: Right;  CDE: 12.31  . Colonoscopy    . Amputation Right 04/28/2015    Procedure: AMPUTATION RAY, RIGHT 5TH TOE;  Surgeon: Marybelle Killings, MD;  Location: [facility];  Service: Orthopedics;  Laterality: Right;  . Amputation Right 05/10/2015    Procedure: Right Below Knee Amputation;  Surgeon: Marybelle Killings, MD;  Location: [facility];  Service: Orthopedics;  Laterality: Right;   Social History   Social History  . Marital Status: Married    Spouse Name: N/A  . Number of Children: 2  . Years of Education: college   Occupational History  . Pastor    Social History Main Topics  . Smoking status: Never Smoker   . Smokeless tobacco: Never Used  . Alcohol Use: No  . Drug Use: No  . Sexual Activity: Not Asked   Other Topics Concern  . None   Social History  Narrative   Patient lives with his wife    Patient right handed   Patient drinks caffine on occ.   Outpatient Encounter Prescriptions as of 01/19/2016  Medication Sig  . acetaminophen (TYLENOL) 325 MG tablet Take 1-2 tablets (325-650 mg total) by mouth every 4 (four) hours as needed for mild pain.  Marland Kitchen atenolol (TENORMIN) 25 MG tablet Take 1 tablet (25 mg total) by mouth daily.  . Calcium Carb-Cholecalciferol (CALCIUM PLUS VITAMIN D3) 600-500 MG-UNIT CAPS Take 1 capsule by mouth daily.   . cilostazol (PLETAL) 100 MG tablet Take 1 tablet (100 mg total) by mouth 2 (two) times daily.  . clopidogrel (PLAVIX) 75 MG tablet Take 1 tablet (75 mg total) by mouth daily.  Marland Kitchen exenatide (BYETTA) 10 MCG/0.04ML SOPN injection Inject 0.04 mLs (10 mcg total) into the skin 2 (two) times daily with a meal.  . furosemide (LASIX) 40 MG tablet Take 1 tablet (40 mg total) by mouth daily.  Marland Kitchen glucose blood (BAYER CONTOUR NEXT TEST) test strip Use as instructed 4 x daily  . metFORMIN (GLUCOPHAGE) 500 MG tablet Take 1 tablet (500 mg total) by mouth 2 (two) times daily with a meal.  . rosuvastatin (CRESTOR) 20 MG tablet Take 1  tablet (20 mg total) by mouth daily.  . [DISCONTINUED] LANTUS SOLOSTAR 100 UNIT/ML Solostar Pen Inject 30 Units into the skin at bedtime.   No facility-administered encounter medications on file as of 01/19/2016.   ALLERGIES: Allergies  Allergen Reactions  . Zolpidem Tartrate Other (See Comments)    disorientation    VACCINATION STATUS: Immunization History  Administered Date(s) Administered  . Influenza Split 06/30/2013  . Influenza-Unspecified 07/30/2015  . Pneumococcal Polysaccharide-23 07/31/2007  . Td 10/30/2002    Diabetes He presents for his follow-up diabetic visit. He has type 2 diabetes mellitus. Onset time: He was diagnosed at approximate age of 31 years. His disease course has been improving. There are no hypoglycemic associated symptoms. Pertinent negatives for hypoglycemia  include no confusion, headaches, pallor or seizures. There are no diabetic associated symptoms. Pertinent negatives for diabetes include no chest pain, no fatigue, no polydipsia, no polyphagia, no polyuria and no weakness. There are no hypoglycemic complications. Symptoms are improving. Diabetic complications include PVD. (Status post bilateral lower extremity amputations. He is wheelchair-bound, awaiting for leg prosthetics.) Risk factors for coronary artery disease include diabetes mellitus, dyslipidemia, hypertension, male sex, obesity, sedentary lifestyle and tobacco exposure. Current diabetic treatment includes insulin injections (Byetta 10 g subcutaneous twice a day.). He is compliant with treatment most of the time. His weight is stable. He is following a diabetic diet. When asked about meal planning, he reported none. He has had a previous visit with a dietitian. He never participates in exercise. His home blood glucose trend is decreasing steadily. His breakfast blood glucose range is generally 90-110 mg/dl. His lunch blood glucose range is generally 110-130 mg/dl. His dinner blood glucose range is generally 110-130 mg/dl. His overall blood glucose range is 110-130 mg/dl. An ACE inhibitor/angiotensin II receptor blocker is being taken.  Hyperlipidemia This is a chronic problem. The current episode started more than 1 year ago. The problem is controlled. Pertinent negatives include no chest pain, myalgias or shortness of breath. Current antihyperlipidemic treatment includes statins. Risk factors for coronary artery disease include dyslipidemia, diabetes mellitus, hypertension, male sex, obesity and a sedentary lifestyle.  Hypertension This is a chronic problem. The current episode started more than 1 year ago. Pertinent negatives include no chest pain, headaches, neck pain, palpitations or shortness of breath. Risk factors for coronary artery disease include diabetes mellitus, dyslipidemia, male gender,  obesity, sedentary lifestyle and smoking/tobacco exposure. Hypertensive end-organ damage includes PVD.     Review of Systems  Constitutional: Negative for fatigue and unexpected weight change.  HENT: Negative for dental problem, mouth sores and trouble swallowing.   Eyes: Negative for visual disturbance.  Respiratory: Negative for cough, choking, chest tightness, shortness of breath and wheezing.   Cardiovascular: Negative for chest pain, palpitations and leg swelling.  Gastrointestinal: Negative for nausea, vomiting, abdominal pain, diarrhea, constipation and abdominal distention.  Endocrine: Negative for polydipsia, polyphagia and polyuria.  Genitourinary: Negative for dysuria, urgency, hematuria and flank pain.  Musculoskeletal: Positive for gait problem. Negative for myalgias, back pain and neck pain.       Wheelchair-bound, awaiting Legs prosthetics.  Skin: Negative for pallor, rash and wound.  Neurological: Negative for seizures, syncope, weakness, numbness and headaches.  Psychiatric/Behavioral: Negative.  Negative for suicidal ideas, confusion and dysphoric mood.    Objective:    BP 113/73 mmHg  Pulse 76  Ht 5\' 10"  (1.778 m)  SpO2 97%  Wt Readings from Last 3 Encounters:  05/19/15 212 lb 0.3 oz (96.172 kg)  05/10/15 245  lb 5 oz (111.273 kg)  04/28/15 245 lb 5.9 oz (111.299 kg)    Physical Exam  Constitutional: He is oriented to person, place, and time. He appears well-developed and well-nourished. He is cooperative. No distress.  HENT:  Head: Normocephalic and atraumatic.  Eyes: EOM are normal.  Neck: Normal range of motion. Neck supple. No tracheal deviation present. No thyromegaly present.  Cardiovascular: Normal rate, S1 normal, S2 normal and normal heart sounds.  Exam reveals no gallop.   No murmur heard. Pulses:      Dorsalis pedis pulses are 1+ on the right side, and 1+ on the left side.       Posterior tibial pulses are 1+ on the right side, and 1+ on the left  side.  Pulmonary/Chest: Breath sounds normal. No respiratory distress. He has no wheezes.  Abdominal: Soft. Bowel sounds are normal. He exhibits no distension. There is no tenderness. There is no guarding and no CVA tenderness.  Musculoskeletal:       Right shoulder: He exhibits no swelling and no deformity.  Bilateral amputations of lower extremities.  Neurological: He is alert and oriented to person, place, and time. He has normal strength and normal reflexes. No cranial nerve deficit or sensory deficit. Gait normal.  Skin: Skin is warm and dry. No rash noted. No cyanosis. Nails show no clubbing.  Psychiatric: He has a normal mood and affect. His speech is normal and behavior is normal. Judgment and thought content normal. Cognition and memory are normal.    Results for orders placed or performed in visit on 123456  Basic metabolic panel  Result Value Ref Range   Sodium 139 135 - 146 mmol/L   Potassium 4.2 3.5 - 5.3 mmol/L   Chloride 103 98 - 110 mmol/L   CO2 25 20 - 31 mmol/L   Glucose, Bld 119 (H) 65 - 99 mg/dL   BUN 29 (H) 7 - 25 mg/dL   Creat 1.65 (H) 0.70 - 1.25 mg/dL   Calcium 8.9 8.6 - 10.3 mg/dL  Hemoglobin A1c  Result Value Ref Range   Hgb A1c MFr Bld 5.9 (H) <5.7 %   Mean Plasma Glucose 123 (H) <117 mg/dL   Complete Blood Count (Most recent): Lab Results  Component Value Date   WBC 7.6 05/20/2015   HGB 8.3* 05/20/2015   HCT 25.4* 05/20/2015   MCV 87.3 05/20/2015   PLT 380 05/20/2015   Chemistry (most recent): Lab Results  Component Value Date   NA 139 01/12/2016   K 4.2 01/12/2016   CL 103 01/12/2016   CO2 25 01/12/2016   BUN 29* 01/12/2016   CREATININE 1.65* 01/12/2016   Diabetic Labs (most recent): Lab Results  Component Value Date   HGBA1C 5.9* 01/12/2016   HGBA1C 5.6 07/09/2015   HGBA1C 7.1* 04/12/2015   Lipid profile (most recent): Lab Results  Component Value Date   TRIG 68 10/15/2015   CHOL 135 10/15/2015    Assessment & Plan:   1.  Type 2 diabetes mellitus with other circulatory complications (HCC)  -His  diabetes is  complicated by extensive peripheral arterial disease with bilateral lower extremity amputations and patient remains at a high risk for more acute and chronic complications of diabetes which include CAD, CVA, CKD, retinopathy, and neuropathy. These are all discussed in detail with the patient.  Patient came with controlled glucose profile, and  recent A1c of 5.6 %.  Glucose logs and insulin administration records pertaining to this visit,  to be  scanned into patient's records.  Recent labs reviewed.   - I have re-counseled the patient on diet management and weight loss  by adopting a carbohydrate restricted / protein rich  Diet.  - Suggestion is made for patient to avoid simple carbohydrates   from their diet including Cakes , Desserts, Ice Cream,  Soda (  diet and regular) , Sweet Tea , Candies,  Chips, Cookies, Artificial Sweeteners,   and "Sugar-free" Products .  This will help patient to have stable blood glucose profile and potentially avoid unintended  Weight gain.  - Patient is advised to stick to a routine mealtimes to eat 3 meals  a day and avoid unnecessary snacks ( to snack only to correct hypoglycemia).  - The patient  has been  scheduled with Jearld Fenton, RDN, CDE for individualized DM education.  - I have approached patient with the following individualized plan to manage diabetes and patient agrees. -  He has tolerated metformin and has favorable renal function. - I will hold  Lantus for now. - Continue Byetta 10 mcg sq BID.  -I will continue metformin 500 mg by mouth twice a day.   - Patient specific target  for A1c; LDL, HDL, Triglycerides, and  Waist Circumference were discussed in detail.  2) BP/HTN: Controlled. Continue current medications including ACEI/ARB. 3) Lipids/HPL:  continue statins. 4)  Weight/Diet: CDE consult in progress, exercise, and carbohydrates information  provided.  5) Chronic Care/Health Maintenance:  -Patient is on ACEI/ARB and Statin medications and encouraged to continue to follow up with Ophthalmology, Podiatrist at least yearly or according to recommendations, and advised to  stay away from smoking. I have recommended yearly flu vaccine and pneumonia vaccination at least every 5 years; moderate intensity exercise for up to 150 minutes weekly; and  sleep for at least 7 hours a day.  - 25 minutes of time was spent on the care of this patient , 50% of which was applied for counseling on diabetes complications and their preventions.  - I advised patient to maintain close follow up with Vic Blackbird, MD for primary care needs.  Patient is asked to bring meter and  blood glucose logs during their next visit.   Follow up plan: -Return in about 3 months (around 04/20/2016) for diabetes, high blood pressure, high cholesterol, follow up with pre-visit labs, meter, and logs.  Glade Lloyd, MD Phone: [phone]  Fax: [phone]   01/19/2016, 1:40 PM

## 2016-01-20 ENCOUNTER — Ambulatory Visit: Payer: BLUE CROSS/BLUE SHIELD | Admitting: Physical Therapy

## 2016-01-20 ENCOUNTER — Encounter: Payer: Self-pay | Admitting: Physical Therapy

## 2016-01-20 DIAGNOSIS — M2560 Stiffness of unspecified joint, not elsewhere classified: Secondary | ICD-10-CM

## 2016-01-20 DIAGNOSIS — Z89612 Acquired absence of left leg above knee: Secondary | ICD-10-CM

## 2016-01-20 DIAGNOSIS — Z89511 Acquired absence of right leg below knee: Secondary | ICD-10-CM

## 2016-01-20 DIAGNOSIS — R6889 Other general symptoms and signs: Secondary | ICD-10-CM

## 2016-01-20 DIAGNOSIS — Z7409 Other reduced mobility: Secondary | ICD-10-CM

## 2016-01-20 DIAGNOSIS — Z4789 Encounter for other orthopedic aftercare: Secondary | ICD-10-CM

## 2016-01-20 DIAGNOSIS — R269 Unspecified abnormalities of gait and mobility: Secondary | ICD-10-CM

## 2016-01-20 DIAGNOSIS — R2681 Unsteadiness on feet: Secondary | ICD-10-CM

## 2016-01-20 DIAGNOSIS — R531 Weakness: Secondary | ICD-10-CM

## 2016-01-20 DIAGNOSIS — R2689 Other abnormalities of gait and mobility: Secondary | ICD-10-CM

## 2016-01-21 NOTE — Therapy (Signed)
[facility] [address] [place], [place], [address] Phone: [phone]   Fax:  [phone]  Physical Therapy Treatment  Patient Details  Name: [name] MRN: [id number] Date of Birth: [birth year] Referring Provider: Rodell Perna, MD  Encounter Date: 01/20/2016      PT End of Session - 01/20/16 1400    Visit Number 7   Number of Visits 24   Date for PT Re-Evaluation 03/17/16   Authorization Type BCBS   PT Start Time 1315   PT Stop Time 1400   PT Time Calculation (min) 45 min   Equipment Utilized During Treatment Gait belt   Activity Tolerance Patient tolerated treatment well   Behavior During Therapy [facility] for tasks assessed/performed      Past Medical History  Diagnosis Date  . Hypertension   . Hyperlipidemia   . Necrosis ([place])     #2 nail   . Myocardial infarction ([place])     "mild" heart attack  . Poor circulation of extremity ([place])   . Sleep apnea     uses cpap, getting a new one  . Type 2 Diabetes mellitus     Type 2  . Arthritis   . Pneumonia     as a child  . CKD (chronic kidney disease) 06/28/2014    Sees Dr Florene Glen  . Cerebrovascular disease     MRI shows Right carotid inferior cavernours narrowing 75% and  50-75% stenosis of Cavernous and supraclinoi right side    Past Surgical History  Procedure Laterality Date  . Above knee leg amputation Left 2004  . Cataract extraction w/phaco  09/05/2012    Procedure: CATARACT EXTRACTION PHACO AND INTRAOCULAR LENS PLACEMENT (IOC);  Surgeon: Tonny Branch, MD;  Location: [facility];  Service: Ophthalmology;  Laterality: Left;  CDE=5.45  . Cataract extraction w/phaco  10/03/2012    Procedure: CATARACT EXTRACTION PHACO AND INTRAOCULAR LENS PLACEMENT (IOC);  Surgeon: Tonny Branch, MD;  Location: [facility];  Service: Ophthalmology;  Laterality: Right;  CDE: 12.31  . Colonoscopy    . Amputation Right 04/28/2015    Procedure: AMPUTATION RAY, RIGHT 5TH TOE;  Surgeon: Marybelle Killings, MD;  Location: [place];  Service: Orthopedics;  Laterality: Right;  . Amputation Right 05/10/2015    Procedure: Right Below Knee Amputation;  Surgeon: Marybelle Killings, MD;  Location: [place];  Service: Orthopedics;  Laterality: Right;    There were no vitals filed for this visit.  Visit Diagnosis:  Abnormality of gait  Unsteadiness  Encounter for prosthetic gait training  Decreased functional activity tolerance  Balance problems  Stiffness due to immobility  Weakness  Status post below knee amputation of right lower extremity (HCC)  Status post above knee amputation of left lower extremity (HCC)      Subjective Assessment - 01/20/16 1315    Subjective Patient reports he has worn prostheses 4 hours 2x/day. No issues. No falls.   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 No/denies                         [facility] Adult PT Treatment/Exercise - 01/20/16 1315    Transfers   Transfers Sit to Stand;Stand to Sit  Sit to Stand 5: Supervision;With upper extremity assist;With armrests;From chair/3-in-1  to RW for stabilization, needs chair stabilized    Sit to Stand Details Verbal cues for technique   Stand to Sit 5: Supervision;With upper extremity assist;With armrests;To chair/3-in-1  from RW   Ambulation/Gait   Ambulation/Gait Yes   Ambulation/Gait Assistance 5: Supervision   Ambulation/Gait Assistance Details verbal cues on posture & step length   Ambulation Distance (Feet) 220 Feet  100' X 2, 220' X 1   Assistive device Rolling walker;Prostheses   Gait Pattern Decreased stride length;Decreased hip/knee flexion - left;Trunk flexed;Step-through pattern   Ambulation Surface Indoor;Level   Stairs  Yes   Stairs Assistance 4: Min assist  2nd person for safety   Stairs Assistance Details (indicate cue type and reason) PT demo, instructed prior & verbal, manual cues during for technique with bilateral prostheses   Stair Management Technique Two rails;Step to pattern;Forwards   Number of Stairs 4   Ramp 3: Mod assist  RW & prostheses, 2nd person for safety   Ramp Details (indicate cue type and reason) demo & instructed prior, verbal & manual cues during on technique   Curb 3: Mod assist  RW & prostheses   Curb Details (indicate cue type and reason) demo & instructed technique prior and verbal, manual cues during on technique   Gait Comments --   Prosthetics   Prosthetic Care Comments  increasing activity level with increasing frequency of short distance gait in home & church office and 1-2 longer distances to his tolerance when family or friends can supervise   Current prosthetic wear tolerance (days/week)  daily   Current prosthetic wear tolerance (#hours/day)  Current 4 hours 2x/day, increase to all awake hours from bath to bed time except 2 hrs mid-day.   Current prosthetic weight-bearing tolerance (hours/day)  tolerated standing intermittently 30 of 45 minute session without pain   Edema None   Residual limb condition  no issues   Education Provided Residual limb care;Proper Donning;Proper wear schedule/adjustment  see prosthetic care   Person(s) Educated Patient;Spouse   Education Method Explanation;Demonstration;Tactile cues;Verbal cues   Education Method Verbalized understanding;Returned demonstration;Tactile cues required;Verbal cues required;Needs further instruction                PT Education - 01/20/16 1400    Education provided Yes   Education Details increasing activity level   Person(s) Educated Patient;Spouse   Methods Explanation   Comprehension Verbalized understanding          PT Short Term Goals - 01/20/16 1400    PT SHORT TERM GOAL #1   Title  Patient donnes bilateral prostheses correctly and verbalizes proper cleaning of prostheses. (Target Date: 01/24/2016)   Baseline MET 01/20/2016   Time 1   Period Months   Status Achieved   PT SHORT TERM GOAL #2   Title Patient tolerates wear of bilateral prostheses >8 hrs total per day without skin issues or limb tenderness. (Target Date: 01/24/2016)   Baseline MET 01/20/2016   Time 1   Period Months   Status Achieved   PT SHORT TERM GOAL #3   Title Sit to/from stand transfers from w/c to RW with bilateral prostheses with supervision. (Target Date: 01/24/2016)   Baseline MET 01/20/2016   Time 1   Period Months   Status Achieved   PT SHORT TERM GOAL #4   Title Patient reaches 7" anteriorly, laterally, across midline and to floor with RW support with bilateral prostheses with supervision. (Target Date:  01/24/2016)   Time 1   Period Months   Status On-going   PT SHORT TERM GOAL #5   Title Patient ambulates 80' with RW & bilateral prostheses with minA. (Target Date: 01/24/2016)   Baseline MET 01/20/2016   Time 1   Period Months   Status Achieved           PT Long Term Goals - 01/20/16 1400    PT LONG TERM GOAL #1   Title Patient verbalizes & demonstrates proper prosthetic care to enable safe use of prostheses. (Target Date: 03/17/2016)   Time 12   Period Weeks   Status On-going   PT LONG TERM GOAL #2   Title Patient tolerates wear of bilateral prostheses >90% of awake hours without skin issues or limb tenderness to enable function throughout his day.  (Target Date: 03/17/2016)   Time 12   Period Weeks   Status On-going   PT LONG TERM GOAL #3   Title Patient able to perform standing balance activities with UE support reaching >10" all directions & to floor modified independent.  (Target Date: 03/17/2016)   Time 12   Period Weeks   Status On-going   PT LONG TERM GOAL #4   Title Patient ambulates 300' with LRAD & bilateral prostheses modified independent to enable community mobility.   (Target Date: 03/17/2016)   Time 12   Period Weeks   Status On-going   PT LONG TERM GOAL #5   Title Patient negotiates ramps, curbs & stairs with LRAD & bilateral prostheses modified independent to enable community mobility.  (Target Date: 03/17/2016)   Time 12   Period Weeks   Status On-going   PT LONG TERM GOAL #6   Title Patient ambulates household around furniture with [facility] & bilateral prostheses modified independent.  (Target Date: 03/17/2016)   Time 12   Period Weeks   Status On-going               Plan - 01/20/16 1400    Clinical Impression Statement Patient has basic understanding of technique for barriers with prostheses but needs additional skilled care before attempting outside of PT. Patient is on target to meet STGs.    Pt will benefit from skilled therapeutic intervention in order to improve on the following deficits Abnormal gait;Decreased activity tolerance;Decreased balance;Decreased knowledge of precautions;Decreased knowledge of use of DME;Decreased mobility;Postural dysfunction;Prosthetic Dependency   Rehab Potential Good   PT Frequency 2x / week   PT Duration 12 weeks   PT Treatment/Interventions ADLs/Self Care Home Management;DME Instruction;Gait training;Stair training;Functional mobility training;Therapeutic activities;Therapeutic exercise;Balance training;Neuromuscular re-education;Patient/family education;Prosthetic Training   PT Next Visit Plan assess remaining STG Review prosthetic care, Continue with gait with prostheses/RW including barriers   Consulted and Agree with Plan of Care Patient;Family member/caregiver   Family Member Consulted wife        Problem List Patient Active Problem List   Diagnosis Date Noted  . Visit for wound care 07/19/2015  . Status post below knee amputation of right lower extremity ([place]) 05/14/2015  . History of left above knee amputation ([place]) 05/14/2015  . Toe amputation status ([place]) 04/28/2015  . OSA (obstructive  sleep apnea) 02/23/2015  . Type 2 diabetes mellitus with other circulatory complications ([place]) 85/63/1497  . HLD (hyperlipidemia) 08/04/2014  . CKD (chronic kidney disease) 06/28/2014  . TIA (transient ischemic attack) 06/27/2014  . OBESITY, UNSPECIFIED 08/17/2010  . SYNCOPE 08/17/2010  . Hyperlipidemia 08/11/2010  . Essential hypertension 08/11/2010    Jamey Reas PT, DPT  01/21/2016, 5:32 PM  [facility] [address] [place], [place], [address] Phone: [phone]   Fax:  [phone]  Name: [name] MRN: [id number] Date of Birth: 1951/02/25

## 2016-01-24 ENCOUNTER — Ambulatory Visit: Payer: BLUE CROSS/BLUE SHIELD | Admitting: Physical Therapy

## 2016-01-24 DIAGNOSIS — R531 Weakness: Secondary | ICD-10-CM

## 2016-01-24 DIAGNOSIS — Z89612 Acquired absence of left leg above knee: Secondary | ICD-10-CM

## 2016-01-24 DIAGNOSIS — R2689 Other abnormalities of gait and mobility: Secondary | ICD-10-CM

## 2016-01-24 DIAGNOSIS — Z89511 Acquired absence of right leg below knee: Secondary | ICD-10-CM

## 2016-01-24 DIAGNOSIS — R6889 Other general symptoms and signs: Secondary | ICD-10-CM

## 2016-01-24 DIAGNOSIS — M2560 Stiffness of unspecified joint, not elsewhere classified: Secondary | ICD-10-CM

## 2016-01-24 DIAGNOSIS — R269 Unspecified abnormalities of gait and mobility: Secondary | ICD-10-CM

## 2016-01-24 DIAGNOSIS — Z7409 Other reduced mobility: Secondary | ICD-10-CM

## 2016-01-24 DIAGNOSIS — Z4789 Encounter for other orthopedic aftercare: Secondary | ICD-10-CM

## 2016-01-24 DIAGNOSIS — R2681 Unsteadiness on feet: Secondary | ICD-10-CM

## 2016-01-24 NOTE — Therapy (Signed)
[facility] [address] [place], [place], [address] Phone: [phone]   Fax:  [phone]  Physical Therapy Treatment  Patient Details  Name: [name] MRN: [id number] Date of Birth: Mar 26, 1951 Referring Provider: Rodell Perna, MD  Encounter Date: 01/24/2016      PT End of Session - 01/24/16 0930    Visit Number 8   Number of Visits 24   Date for PT Re-Evaluation 03/17/16   Authorization Type BCBS   PT Start Time 0845   PT Stop Time 0930   PT Time Calculation (min) 45 min   Equipment Utilized During Treatment Gait belt   Activity Tolerance Patient tolerated treatment well   Behavior During Therapy [facility] for tasks assessed/performed      Past Medical History  Diagnosis Date  . Hypertension   . Hyperlipidemia   . Necrosis ([place])     #2 nail   . Myocardial infarction ([place])     "mild" heart attack  . Poor circulation of extremity ([place])   . Sleep apnea     uses cpap, getting a new one  . Type 2 Diabetes mellitus     Type 2  . Arthritis   . Pneumonia     as a child  . CKD (chronic kidney disease) 06/28/2014    Sees Dr Florene Glen  . Cerebrovascular disease     MRI shows Right carotid inferior cavernours narrowing 75% and  50-75% stenosis of Cavernous and supraclinoi right side    Past Surgical History  Procedure Laterality Date  . Above knee leg amputation Left 2004  . Cataract extraction w/phaco  09/05/2012    Procedure: CATARACT EXTRACTION PHACO AND INTRAOCULAR LENS PLACEMENT (IOC);  Surgeon: Tonny Branch, MD;  [facility];  Service: Ophthalmology;  Laterality: Left;  CDE=5.45  . Cataract extraction w/phaco  10/03/2012    Procedure: CATARACT EXTRACTION PHACO AND INTRAOCULAR LENS PLACEMENT (IOC);  Surgeon: Tonny Branch, MD;  Location: [facility];  Service: Ophthalmology;  Laterality: Right;  CDE: 12.31  . Colonoscopy    . Amputation Right 04/28/2015    Procedure: AMPUTATION RAY, RIGHT 5TH TOE;  Surgeon: Marybelle Killings, MD;  Location: [place];  Service: Orthopedics;  Laterality: Right;  . Amputation Right 05/10/2015    Procedure: Right Below Knee Amputation;  Surgeon: Marybelle Killings, MD;  Location: [place];  Service: Orthopedics;  Laterality: Right;    There were no vitals filed for this visit.  Visit Diagnosis:  Abnormality of gait  Unsteadiness  Encounter for prosthetic gait training  Decreased functional activity tolerance  Balance problems  Stiffness due to immobility  Weakness  Status post below knee amputation of right lower extremity (HCC)  Status post above knee amputation of left lower extremity (HCC)      Subjective Assessment - 01/24/16 1547    Subjective Wore prostheses to church without issues but stayed in w/c. He wore prostheses most of awake hours without issues.    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 No/denies                         [facility] Adult PT Treatment/Exercise - 01/24/16 0845    Transfers  Transfers Sit to Stand;Stand to Sit   Sit to Stand 5: Supervision;With upper extremity assist;With armrests;From chair/3-in-1;4: Min assist  SBA from w/c to RW, MinA from chairs without armrests   Sit to Stand Details Verbal cues for technique;Visual cues/gestures for sequencing;Tactile cues for weight shifting   Sit to Stand Details (indicate cue type and reason) PT demo, instructed in technique from folding chair without armrests.    Stand to Sit 5: Supervision;With upper extremity assist;With armrests;To chair/3-in-1;4: Min assist  SBA from RW, MinA to control descent of chair no armrests   Stand to Sit Details (indicate cue type and reason) Visual cues/gestures for  precautions/safety;Visual cues/gestures for sequencing;Verbal cues for technique   Stand to Sit Details PT demo, instructed in technique to chairs without armrests.    Ambulation/Gait   Ambulation/Gait Yes   Ambulation/Gait Assistance 5: Supervision;4: Min assist  SBA RW, MinA (2 people for safety) crutches   Ambulation/Gait Assistance Details Verbal cues on posture, step width and weight with RW; Cues on weight hsift and balance reactons with crutches.   Ambulation Distance (Feet) 220 Feet  220' & 100' with RW; 125' with crutches   Assistive device Rolling walker;Prostheses;Crutches   Gait Pattern Decreased stride length;Decreased hip/knee flexion - left;Trunk flexed;Step-through pattern   Ambulation Surface Indoor;Level   Stairs Yes   Stairs Assistance 4: Min assist   Stairs Assistance Details (indicate cue type and reason) cues on technique   Stair Management Technique Two rails;Step to pattern;Forwards   Number of Stairs 4   Ramp 3: Mod assist  RW & prostheses   Ramp Details (indicate cue type and reason) cues on technique, posture & weight shift   Curb 3: Mod assist  RW & prostheses   Curb Details (indicate cue type and reason) cues on technique   Balance   Balance Assessed Yes   Dynamic Standing Balance   Dynamic Standing - Balance Support Left upper extremity supported   Dynamic Standing - Level of Assistance 5: Stand by assistance   Dynamic Standing - Balance Activities Reaching for objects;Reaching across midline   Reaching for weighted objects comments: Reaches 7" anteriorly, laterally and to knee level towards floor.   Reaching across midline comments: able to extend UE 7" to reach for objects;   Prosthetics   Prosthetic Care Comments  dry limb /liner q4 hrs or prn; signs of sweating   Current prosthetic wear tolerance (days/week)  daily   Current prosthetic wear tolerance (#hours/day)  wearing all awake hours drying q4 hrs    Current prosthetic weight-bearing tolerance  (hours/day)  tolerated standing intermittently 30 of 45 minute session without pain   Edema None   Residual limb condition  no issues   Education Provided Residual limb care;Proper Donning;Proper wear schedule/adjustment  see prosthetic care   Person(s) Educated Patient;Spouse   Education Method Explanation;Verbal cues;Demonstration;Tactile cues   Education Method Verbalized understanding;Returned demonstration;Tactile cues required;Verbal cues required;Needs further instruction                  PT Short Term Goals - 01/20/16 1400    PT SHORT TERM GOAL #1   Title Patient donnes bilateral prostheses correctly and verbalizes proper cleaning of prostheses. (Target Date: 01/24/2016)   Baseline MET 01/20/2016   Time 1   Period Months   Status Achieved   PT SHORT TERM GOAL #2   Title Patient tolerates wear of bilateral prostheses >8 hrs total per day without skin issues or limb tenderness. (Target Date: 01/24/2016)  Baseline MET 01/20/2016   Time 1   Period Months   Status Achieved   PT SHORT TERM GOAL #3   Title Sit to/from stand transfers from w/c to RW with bilateral prostheses with supervision. (Target Date: 01/24/2016)   Baseline MET 01/20/2016   Time 1   Period Months   Status Achieved   PT SHORT TERM GOAL #4   Title Patient reaches 7" anteriorly, laterally, across midline and to floor with RW support with bilateral prostheses with supervision. (Target Date: 01/24/2016)   Time 1   Period Months   Status On-going   PT SHORT TERM GOAL #5   Title Patient ambulates 14' with RW & bilateral prostheses with minA. (Target Date: 01/24/2016)   Baseline MET 01/20/2016   Time 1   Period Months   Status Achieved           PT Long Term Goals - 01/20/16 1400    PT LONG TERM GOAL #1   Title Patient verbalizes & demonstrates proper prosthetic care to enable safe use of prostheses. (Target Date: 03/17/2016)   Time 12   Period Weeks   Status On-going   PT LONG TERM GOAL #2    Title Patient tolerates wear of bilateral prostheses >90% of awake hours without skin issues or limb tenderness to enable function throughout his day.  (Target Date: 03/17/2016)   Time 12   Period Weeks   Status On-going   PT LONG TERM GOAL #3   Title Patient able to perform standing balance activities with UE support reaching >10" all directions & to floor modified independent.  (Target Date: 03/17/2016)   Time 12   Period Weeks   Status On-going   PT LONG TERM GOAL #4   Title Patient ambulates 300' with LRAD & bilateral prostheses modified independent to enable community mobility.  (Target Date: 03/17/2016)   Time 12   Period Weeks   Status On-going   PT LONG TERM GOAL #5   Title Patient negotiates ramps, curbs & stairs with LRAD & bilateral prostheses modified independent to enable community mobility.  (Target Date: 03/17/2016)   Time 12   Period Weeks   Status On-going   PT LONG TERM GOAL #6   Title Patient ambulates household around furniture with [facility] & bilateral prostheses modified independent.  (Target Date: 03/17/2016)   Time 12   Period Weeks   Status On-going               Plan - 01/24/16 0930    Clinical Impression Statement Patient improved negotiating barriers with less assistance needed today & no second person for safety. Patient previously ambulated with AKA prosthesis & quad cane. PT assessed gait with crutches using 3-point pattern with good balance reactions. He appears appropriate to continue work with PT to ambulate with crutches.    Pt will benefit from skilled therapeutic intervention in order to improve on the following deficits Abnormal gait;Decreased activity tolerance;Decreased balance;Decreased knowledge of precautions;Decreased knowledge of use of DME;Decreased mobility;Postural dysfunction;Prosthetic Dependency   Rehab Potential Good   PT Frequency 2x / week   PT Duration 12 weeks   PT Treatment/Interventions ADLs/Self Care Home Management;DME  Instruction;Gait training;Stair training;Functional mobility training;Therapeutic activities;Therapeutic exercise;Balance training;Neuromuscular re-education;Patient/family education;Prosthetic Training   PT Next Visit Plan Review prosthetic care, Continue with gait with prostheses/RW on barriers and level surfaces with crutches   Consulted and Agree with Plan of Care Patient;Family member/caregiver   Family Member Consulted wife  Problem List Patient Active Problem List   Diagnosis Date Noted  . Visit for wound care 07/19/2015  . Status post below knee amputation of right lower extremity ([place]) 05/14/2015  . History of left above knee amputation ([place]) 05/14/2015  . Toe amputation status ([place]) 04/28/2015  . OSA (obstructive sleep apnea) 02/23/2015  . Type 2 diabetes mellitus with other circulatory complications ([place]) 34/62/1947  . HLD (hyperlipidemia) 08/04/2014  . CKD (chronic kidney disease) 06/28/2014  . TIA (transient ischemic attack) 06/27/2014  . OBESITY, UNSPECIFIED 08/17/2010  . SYNCOPE 08/17/2010  . Hyperlipidemia 08/11/2010  . Essential hypertension 08/11/2010    Jamey Reas PT, DPT 01/24/2016, 4:08 PM  [facility] [address] [place], [place], [address] Phone: [phone]   Fax:  [phone]  Name: [name] MRN: [id number] Date of Birth: 1951/06/08

## 2016-01-26 ENCOUNTER — Encounter: Payer: Self-pay | Admitting: Physical Therapy

## 2016-01-26 ENCOUNTER — Ambulatory Visit: Payer: BLUE CROSS/BLUE SHIELD | Admitting: Physical Therapy

## 2016-01-26 DIAGNOSIS — R531 Weakness: Secondary | ICD-10-CM

## 2016-01-26 DIAGNOSIS — M2560 Stiffness of unspecified joint, not elsewhere classified: Secondary | ICD-10-CM

## 2016-01-26 DIAGNOSIS — R269 Unspecified abnormalities of gait and mobility: Secondary | ICD-10-CM

## 2016-01-26 DIAGNOSIS — R2681 Unsteadiness on feet: Secondary | ICD-10-CM

## 2016-01-26 DIAGNOSIS — Z89612 Acquired absence of left leg above knee: Secondary | ICD-10-CM

## 2016-01-26 DIAGNOSIS — Z4789 Encounter for other orthopedic aftercare: Secondary | ICD-10-CM

## 2016-01-26 DIAGNOSIS — Z89511 Acquired absence of right leg below knee: Secondary | ICD-10-CM

## 2016-01-26 DIAGNOSIS — R6889 Other general symptoms and signs: Secondary | ICD-10-CM

## 2016-01-26 DIAGNOSIS — Z7409 Other reduced mobility: Secondary | ICD-10-CM

## 2016-01-26 DIAGNOSIS — R2689 Other abnormalities of gait and mobility: Secondary | ICD-10-CM

## 2016-01-27 NOTE — Therapy (Signed)
[facility] [address] [place], [place], [address] Phone: [phone]   Fax:  [phone]  Physical Therapy Treatment  Patient Details  Name: [name] MRN: [id number] Date of Birth: 11/01/1950 Referring Provider: Rodell Perna, MD  Encounter Date: 01/26/2016      PT End of Session - 01/26/16 1015    Visit Number 9   Number of Visits 24   Date for PT Re-Evaluation 03/17/16   Authorization Type BCBS   PT Start Time 0930   PT Stop Time 1014   PT Time Calculation (min) 44 min   Equipment Utilized During Treatment Gait belt   Activity Tolerance Patient tolerated treatment well   Behavior During Therapy [facility] for tasks assessed/performed      Past Medical History  Diagnosis Date  . Hypertension   . Hyperlipidemia   . Necrosis ([place])     #2 nail   . Myocardial infarction ([place])     "mild" heart attack  . Poor circulation of extremity ([place])   . Sleep apnea     uses cpap, getting a new one  . Type 2 Diabetes mellitus     Type 2  . Arthritis   . Pneumonia     as a child  . CKD (chronic kidney disease) 06/28/2014    Sees Dr Florene Glen  . Cerebrovascular disease     MRI shows Right carotid inferior cavernours narrowing 75% and  50-75% stenosis of Cavernous and supraclinoi right side    Past Surgical History  Procedure Laterality Date  . Above knee leg amputation Left 2004  . Cataract extraction w/phaco  09/05/2012    Procedure: CATARACT EXTRACTION PHACO AND INTRAOCULAR LENS PLACEMENT (IOC);  Surgeon: Tonny Branch, MD;  Location: [facility];  Service: Ophthalmology;  Laterality: Left;  CDE=5.45  . Cataract extraction w/phaco  10/03/2012    Procedure: CATARACT EXTRACTION PHACO AND INTRAOCULAR LENS PLACEMENT (IOC);  Surgeon: Tonny Branch, MD;  Location: [facility];  Service: Ophthalmology;  Laterality: Right;  CDE: 12.31  . Colonoscopy    . Amputation Right 04/28/2015    Procedure: AMPUTATION RAY, RIGHT 5TH TOE;  Surgeon: Marybelle Killings, MD;  Location: [facility];  Service: Orthopedics;  Laterality: Right;  . Amputation Right 05/10/2015    Procedure: Right Below Knee Amputation;  Surgeon: Marybelle Killings, MD;  Location: [facility];  Service: Orthopedics;  Laterality: Right;    There were no vitals filed for this visit.  Visit Diagnosis:  Abnormality of gait  Unsteadiness  Encounter for prosthetic gait training  Decreased functional activity tolerance  Balance problems  Stiffness due to immobility  Weakness  Status post below knee amputation of right lower extremity (HCC)  Status post above knee amputation of left lower extremity (HCC)      Subjective Assessment - 01/26/16 0930    Subjective Walking in home & church more with RW and family supervision.    Pertinent History IDDM, CKD, HTN, MI, TIA, Left Transfemoral Amputation, arthriits, neuropathy, obesity   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, church (pastor) stand up to preach,    Currently in Pain? No/denies                         [facility] Adult PT Treatment/Exercise - 01/26/16 0930    Transfers   Transfers Sit to Stand;Stand to Sit   Sit to Stand 5: Supervision;With upper extremity assist;With  armrests;From chair/3-in-1;2: Max assist  SBA from w/c to RW, MaxA 2 peo from chairs without armrests   Sit to Stand Details Verbal cues for technique;Visual cues/gestures for sequencing;Tactile cues for weight shifting   Stand to Sit 5: Supervision;With upper extremity assist;With armrests;To chair/3-in-1;4: Min assist  SBA from RW, MinA to control descent of chair no armrests   Stand to Sit Details (indicate cue type and reason) Visual cues/gestures for precautions/safety;Visual cues/gestures for sequencing;Verbal cues for technique   Ambulation/Gait   Ambulation/Gait Yes   Ambulation/Gait Assistance 5: Supervision;4: Min assist  SBA RW, MinA (2 people for safety) crutches    Ambulation/Gait Assistance Details verbal cues on posture and step length with RW. Sequence, posture, step length and technique with crutches.   Ambulation Distance (Feet) 120 Feet  120' & 100' with RW; 35' with crutches   Assistive device Rolling walker;Prostheses;Crutches   Gait Pattern Decreased stride length;Decreased hip/knee flexion - left;Trunk flexed;Step-through pattern   Ambulation Surface Indoor;Level   Stairs Yes   Stairs Assistance 4: Min assist   Stairs Assistance Details (indicate cue type and reason) verbal, manual & tactile cues on technique   Stair Management Technique Two rails;Step to pattern;Forwards;One rail Left;With crutches  2 rails & 1 rail/1 crutch   Number of Stairs 4   Ramp 3: Mod assist  RW & prostheses   Ramp Details (indicate cue type and reason) cues on technique   Curb 3: Mod assist  RW & prostheses   Curb Details (indicate cue type and reason) cues on technique   Balance   Balance Assessed Yes   Dynamic Standing Balance   Dynamic Standing - Balance Support Left upper extremity supported   Dynamic Standing - Level of Assistance 5: Stand by assistance   Dynamic Standing - Balance Activities Reaching for objects;Reaching across midline   Reaching for weighted objects comments: Reaches 7" anteriorly, laterally and to knee level towards floor.   Reaching across midline comments: able to extend UE 7" to reach for objects;   Self-Care   Self-Care ADL's   ADL's managed pants standing with RW support with minA & cues   Prosthetics   Prosthetic Care Comments  dry limb /liner q4 hrs or prn; signs of sweating   Current prosthetic wear tolerance (days/week)  daily   Current prosthetic wear tolerance (#hours/day)  wearing all awake hours drying q4 hrs    Current prosthetic weight-bearing tolerance (hours/day)  tolerated standing intermittently 30 of 45 minute session without pain   Edema None   Residual limb condition  no issues   Education Provided Residual  limb care;Proper [place];Proper wear schedule/adjustment  see prosthetic care   Person(s) Educated Patient   Education Method Explanation;Verbal cues   Education Method Verbalized understanding;Verbal cues required;Needs further instruction                  PT Short Term Goals - 01/26/16 1015    PT SHORT TERM GOAL #1   Title Patient donnes bilateral prostheses correctly and verbalizes proper cleaning of prostheses. (Target Date: 01/24/2016)   Baseline MET 01/20/2016   Time 1   Period Months   Status Achieved   PT SHORT TERM GOAL #2   Title Patient tolerates wear of bilateral prostheses >8 hrs total per day without skin issues or limb tenderness. (Target Date: 01/24/2016)   Baseline MET 01/20/2016   Time 1   Period Months   Status Achieved   PT SHORT TERM GOAL #3   Title Sit to/from  stand transfers from w/c to RW with bilateral prostheses with supervision. (Target Date: 01/24/2016)   Baseline MET 01/20/2016   Time 1   Period Months   Status Achieved   PT SHORT TERM GOAL #4   Title Patient reaches 7" anteriorly, laterally, across midline and to floor with RW support with bilateral prostheses with supervision. (Target Date: 01/24/2016) new Target Date 02/18/2016   Time 1   Period Months   Status On-going   PT SHORT TERM GOAL #5   Title Patient ambulates 39' with RW & bilateral prostheses with minA. (Target Date: 01/24/2016)   Baseline MET 01/20/2016   Time 1   Period Months   Status Achieved   Additional Short Term Goals   Additional Short Term Goals Yes   PT SHORT TERM GOAL #6   Title Patient able to sit to/from stand chair without armrests using UEs to device with minA (Target Date 02/18/2016)   Time 4   Period Weeks   Status New   PT SHORT TERM GOAL #7   Title Patient able to manage pants with RW support for toileting with supervision. (Target Date 02/18/2016)   Time 4   Period Weeks   Status New   PT SHORT TERM GOAL #8   Title Patient ambulates 100' with crutches &  prostheses with supervision. (Target Date 02/18/2016)   PT SHORT TERM GOAL #9   TITLE Patient negotiates ramps, curbs with RW and stairs with 1 rail & 1 crutch with prostheses with supervision. (Target Date 02/18/2016)   Time 4   Status New           PT Long Term Goals - 01/20/16 1400    PT LONG TERM GOAL #1   Title Patient verbalizes & demonstrates proper prosthetic care to enable safe use of prostheses. (Target Date: 03/17/2016)   Time 12   Period Weeks   Status On-going   PT LONG TERM GOAL #2   Title Patient tolerates wear of bilateral prostheses >90% of awake hours without skin issues or limb tenderness to enable function throughout his day.  (Target Date: 03/17/2016)   Time 12   Period Weeks   Status On-going   PT LONG TERM GOAL #3   Title Patient able to perform standing balance activities with UE support reaching >10" all directions & to floor modified independent.  (Target Date: 03/17/2016)   Time 12   Period Weeks   Status On-going   PT LONG TERM GOAL #4   Title Patient ambulates 300' with LRAD & bilateral prostheses modified independent to enable community mobility.  (Target Date: 03/17/2016)   Time 12   Period Weeks   Status On-going   PT LONG TERM GOAL #5   Title Patient negotiates ramps, curbs & stairs with LRAD & bilateral prostheses modified independent to enable community mobility.  (Target Date: 03/17/2016)   Time 12   Period Weeks   Status On-going   PT LONG TERM GOAL #6   Title Patient ambulates household around furniture with [facility] & bilateral prostheses modified independent.  (Target Date: 03/17/2016)   Time 12   Period Weeks   Status On-going               Plan - 01/26/16 1015    Clinical Impression Statement Patient had increased difficulty with sit to stand from chair without armrests today but PT determined that TTA prosthetic liner had slipped which decreases control of prosthesis. Patient improved sit to/from stand and gait with crutches  with PT  instruction.    Pt will benefit from skilled therapeutic intervention in order to improve on the following deficits Abnormal gait;Decreased activity tolerance;Decreased balance;Decreased knowledge of precautions;Decreased knowledge of use of DME;Decreased mobility;Postural dysfunction;Prosthetic Dependency   Rehab Potential Good   PT Frequency 2x / week   PT Duration 12 weeks   PT Treatment/Interventions ADLs/Self Care Home Management;DME Instruction;Gait training;Stair training;Functional mobility training;Therapeutic activities;Therapeutic exercise;Balance training;Neuromuscular re-education;Patient/family education;Prosthetic Training   PT Next Visit Plan Review prosthetic care, Continue with gait with prostheses/RW on barriers and level surfaces with crutches towards STGs   Consulted and Agree with Plan of Care Patient;Family member/caregiver   Family Member Consulted wife        Problem List Patient Active Problem List   Diagnosis Date Noted  . Visit for wound care 07/19/2015  . Status post below knee amputation of right lower extremity ([place]) 05/14/2015  . History of left above knee amputation ([place]) 05/14/2015  . Toe amputation status ([place]) 04/28/2015  . OSA (obstructive sleep apnea) 02/23/2015  . Type 2 diabetes mellitus with other circulatory complications ([place]) 27/61/8485  . HLD (hyperlipidemia) 08/04/2014  . CKD (chronic kidney disease) 06/28/2014  . TIA (transient ischemic attack) 06/27/2014  . OBESITY, UNSPECIFIED 08/17/2010  . SYNCOPE 08/17/2010  . Hyperlipidemia 08/11/2010  . Essential hypertension 08/11/2010    Jamey Reas PT, DPT 01/27/2016, 8:08 AM  [facility] [address] [place], [place], [address] Phone: [phone]   Fax:  [phone]  Name: LANDRUM CARBONELL MRN: [id number] Date of Birth: Jan 08, 1951

## 2016-01-31 ENCOUNTER — Encounter: Payer: Self-pay | Admitting: Physical Therapy

## 2016-01-31 ENCOUNTER — Ambulatory Visit: Payer: BLUE CROSS/BLUE SHIELD | Attending: Orthopaedic Surgery | Admitting: Physical Therapy

## 2016-01-31 DIAGNOSIS — R2689 Other abnormalities of gait and mobility: Secondary | ICD-10-CM | POA: Diagnosis not present

## 2016-01-31 DIAGNOSIS — R2681 Unsteadiness on feet: Secondary | ICD-10-CM | POA: Insufficient documentation

## 2016-01-31 DIAGNOSIS — M6281 Muscle weakness (generalized): Secondary | ICD-10-CM | POA: Insufficient documentation

## 2016-01-31 DIAGNOSIS — M623 Immobility syndrome (paraplegic): Secondary | ICD-10-CM | POA: Diagnosis not present

## 2016-01-31 DIAGNOSIS — Z89612 Acquired absence of left leg above knee: Secondary | ICD-10-CM | POA: Insufficient documentation

## 2016-01-31 DIAGNOSIS — R29818 Other symptoms and signs involving the nervous system: Secondary | ICD-10-CM | POA: Insufficient documentation

## 2016-01-31 DIAGNOSIS — Z5189 Encounter for other specified aftercare: Secondary | ICD-10-CM | POA: Insufficient documentation

## 2016-01-31 DIAGNOSIS — R531 Weakness: Secondary | ICD-10-CM | POA: Insufficient documentation

## 2016-01-31 DIAGNOSIS — Z89511 Acquired absence of right leg below knee: Secondary | ICD-10-CM | POA: Diagnosis not present

## 2016-01-31 DIAGNOSIS — R6889 Other general symptoms and signs: Secondary | ICD-10-CM | POA: Diagnosis not present

## 2016-01-31 NOTE — Therapy (Signed)
[facility] [address] [place], [place], [address] Phone: [phone]   Fax:  [phone]  Physical Therapy Treatment  Patient Details  Name: [name] MRN: [id number] Date of Birth: [birth year] Referring Provider: Rodell Perna, MD  Encounter Date: 01/31/2016      PT End of Session - 01/31/16 0930    Visit Number 10   Number of Visits 24   Date for PT Re-Evaluation 03/17/16   Authorization Type BCBS   PT Start Time 0845   PT Stop Time 0929   PT Time Calculation (min) 44 min   Equipment Utilized During Treatment Gait belt   Activity Tolerance Patient tolerated treatment well   Behavior During Therapy [facility] for tasks assessed/performed      Past Medical History  Diagnosis Date  . Hypertension   . Hyperlipidemia   . Necrosis ([place])     #2 nail   . Myocardial infarction ([place])     "mild" heart attack  . Poor circulation of extremity ([place])   . Sleep apnea     uses cpap, getting a new one  . Type 2 Diabetes mellitus     Type 2  . Arthritis   . Pneumonia     as a child  . CKD (chronic kidney disease) 06/28/2014    Sees Dr Florene Glen  . Cerebrovascular disease     MRI shows Right carotid inferior cavernours narrowing 75% and  50-75% stenosis of Cavernous and supraclinoi right side    Past Surgical History  Procedure Laterality Date  . Above knee leg amputation Left 2004  . Cataract extraction w/phaco  09/05/2012    Procedure: CATARACT EXTRACTION PHACO AND INTRAOCULAR LENS PLACEMENT (IOC);  Surgeon: Tonny Branch, MD;  [facility]: [facility];  Service: Ophthalmology;  Laterality: Left;  CDE=5.45  . Cataract extraction w/phaco  10/03/2012    Procedure: CATARACT EXTRACTION PHACO AND INTRAOCULAR LENS PLACEMENT (IOC);  Surgeon: Tonny Branch, MD;  Location: [facility];  Service: Ophthalmology;  Laterality: Right;  CDE: 12.31  . Colonoscopy    . Amputation Right 04/28/2015    Procedure: AMPUTATION RAY, RIGHT 5TH TOE;  Surgeon: Marybelle Killings, MD;  Location: [facility];  Service: Orthopedics;  Laterality: Right;  . Amputation Right 05/10/2015    Procedure: Right Below Knee Amputation;  Surgeon: Marybelle Killings, MD;  Location: [facility];  Service: Orthopedics;  Laterality: Right;    There were no vitals filed for this visit.  Visit Diagnosis:  Other abnormalities of gait and mobility  Unsteadiness  Encounter for prosthetic gait training  Decreased functional activity tolerance  Balance problems  Stiffness due to immobility  Weakness  Status post below knee amputation of right lower extremity (HCC)  Status post above knee amputation of left lower extremity (HCC)      Subjective Assessment - 01/31/16 0855    Subjective (p) Wearing both prostheses all awake houirs. Walking at church & home level surfaces only.    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 (p) No/denies                         [facility] Adult PT Treatment/Exercise - 01/31/16 4097  Transfers   Transfers Sit to Stand;Stand to Sit   Sit to Stand 5: Supervision;With upper extremity assist;With armrests;From chair/3-in-1;4: Min assist  SBA w/c elevated to RW; MinA w/c to crutches & chair armrest   Sit to Stand Details Verbal cues for technique;Visual cues/gestures for sequencing;Tactile cues for weight shifting   Sit to Stand Details (indicate cue type and reason) verbal & tactile cues with crutches   Stand to Sit 5: Supervision;With upper extremity assist;With armrests;To chair/3-in-1   Stand to Sit Details (indicate cue type and reason) Verbal cues for technique   Ambulation/Gait   Ambulation/Gait Yes   Ambulation/Gait Assistance 5: Supervision   Ambulation/Gait  Assistance Details verbal cues on posture & step length   Ambulation Distance (Feet) 120 Feet  120' X 2   Assistive device Rolling walker;Prostheses;Crutches   Gait Pattern Decreased stride length;Decreased hip/knee flexion - left;Trunk flexed;Step-through pattern   Ambulation Surface Indoor;Level   Stairs Yes   Stairs Assistance 4: Min assist;4: Min guard   Stairs Assistance Details (indicate cue type and reason) verbal cues using single rail & crutch; using rail on left side ascending which swiitches to right side descending was improved stability.    Stair Management Technique One rail Left;With crutches;One rail Right;With cane;Step to pattern;Forwards  lt rail/rt crutch, rt rail/lt crutch, lt rail/rt SBQC   Number of Stairs 4  3 reps varying rail location   Ramp 5: Supervision  RW & prostheses   Ramp Details (indicate cue type and reason) cues on posture, wt shift & step length   Curb 5: Supervision  RW & prostheses   Curb Details (indicate cue type and reason) cues on step length, foot position, technique   Balance   Balance Assessed Yes   Dynamic Standing Balance   Dynamic Standing - Balance Support During functional activity;Left upper extremity supported;No upper extremity supported   Dynamic Standing - Level of Assistance 5: Stand by assistance;4: Min assist   Dynamic Standing - Balance Activities --   Reaching for weighted objects comments: --   Reaching across midline comments: --   Dynamic Standing - Comments managing pants with RW, reaches to floor to retrieve pants with LUE support on RW, able to balance 2-3 seconds without UE support to button pants, intermittent UE support   Self-Care   Self-Care --   ADL's --   Prosthetics   Prosthetic Care Comments  dry limb /liner q4 hrs or prn; signs of sweating   Current prosthetic wear tolerance (days/week)  daily   Current prosthetic wear tolerance (#hours/day)  wearing all awake hours drying q4 hrs    Current prosthetic  weight-bearing tolerance (hours/day)  tolerated standing intermittently 30 of 45 minute session without pain   Edema None   Residual limb condition  no issues   Education Provided Residual limb care;Proper wear schedule/adjustment;Correct ply sock adjustment  see prosthetic care   Person(s) Educated Patient   Education Method Explanation;Verbal cues   Education Method Verbalized understanding;Verbal cues required;Needs further instruction                PT Education - 01/31/16 0845    Education provided Yes   Education Details if installing rail at church at [employer], PT would recommend on left side ascending   Person(s) Educated Patient;Child(ren)   Methods Explanation;Verbal cues   Comprehension Verbalized understanding;Verbal cues required          PT Short Term Goals - 01/26/16 1015    PT SHORT TERM GOAL #1  Title Patient donnes bilateral prostheses correctly and verbalizes proper cleaning of prostheses. (Target Date: 01/24/2016)   Baseline MET 01/20/2016   Time 1   Period Months   Status Achieved   PT SHORT TERM GOAL #2   Title Patient tolerates wear of bilateral prostheses >8 hrs total per day without skin issues or limb tenderness. (Target Date: 01/24/2016)   Baseline MET 01/20/2016   Time 1   Period Months   Status Achieved   PT SHORT TERM GOAL #3   Title Sit to/from stand transfers from w/c to RW with bilateral prostheses with supervision. (Target Date: 01/24/2016)   Baseline MET 01/20/2016   Time 1   Period Months   Status Achieved   PT SHORT TERM GOAL #4   Title Patient reaches 7" anteriorly, laterally, across midline and to floor with RW support with bilateral prostheses with supervision. (Target Date: 01/24/2016) new Target Date 02/18/2016   Time 1   Period Months   Status On-going   PT SHORT TERM GOAL #5   Title Patient ambulates 5' with RW & bilateral prostheses with minA. (Target Date: 01/24/2016)   Baseline MET 01/20/2016   Time 1   Period Months    Status Achieved   Additional Short Term Goals   Additional Short Term Goals Yes   PT SHORT TERM GOAL #6   Title Patient able to sit to/from stand chair without armrests using UEs to device with minA (Target Date 02/18/2016)   Time 4   Period Weeks   Status New   PT SHORT TERM GOAL #7   Title Patient able to manage pants with RW support for toileting with supervision. (Target Date 02/18/2016)   Time 4   Period Weeks   Status New   PT SHORT TERM GOAL #8   Title Patient ambulates 100' with crutches & prostheses with supervision. (Target Date 02/18/2016)   PT SHORT TERM GOAL #9   TITLE Patient negotiates ramps, curbs with RW and stairs with 1 rail & 1 crutch with prostheses with supervision. (Target Date 02/18/2016)   Time 4   Status New           PT Long Term Goals - 01/20/16 1400    PT LONG TERM GOAL #1   Title Patient verbalizes & demonstrates proper prosthetic care to enable safe use of prostheses. (Target Date: 03/17/2016)   Time 12   Period Weeks   Status On-going   PT LONG TERM GOAL #2   Title Patient tolerates wear of bilateral prostheses >90% of awake hours without skin issues or limb tenderness to enable function throughout his day.  (Target Date: 03/17/2016)   Time 12   Period Weeks   Status On-going   PT LONG TERM GOAL #3   Title Patient able to perform standing balance activities with UE support reaching >10" all directions & to floor modified independent.  (Target Date: 03/17/2016)   Time 12   Period Weeks   Status On-going   PT LONG TERM GOAL #4   Title Patient ambulates 300' with LRAD & bilateral prostheses modified independent to enable community mobility.  (Target Date: 03/17/2016)   Time 12   Period Weeks   Status On-going   PT LONG TERM GOAL #5   Title Patient negotiates ramps, curbs & stairs with LRAD & bilateral prostheses modified independent to enable community mobility.  (Target Date: 03/17/2016)   Time 12   Period Weeks   Status On-going   PT LONG TERM  GOAL #  6   Title Patient ambulates household around furniture with [facility] & bilateral prostheses modified independent.  (Target Date: 03/17/2016)   Time 12   Period Weeks   Status On-going               Plan - 01/31/16 0930    Clinical Impression Statement patient required less assistance for level surface gait with crutches and ramps/curbs with RW. Rail on left side to ascend was more stable than rail on right side.    Pt will benefit from skilled therapeutic intervention in order to improve on the following deficits Abnormal gait;Decreased activity tolerance;Decreased balance;Decreased knowledge of precautions;Decreased knowledge of use of DME;Decreased mobility;Postural dysfunction;Prosthetic Dependency   Rehab Potential Good   PT Frequency 2x / week   PT Duration 12 weeks   PT Treatment/Interventions ADLs/Self Care Home Management;DME Instruction;Gait training;Stair training;Functional mobility training;Therapeutic activities;Therapeutic exercise;Balance training;Neuromuscular re-education;Patient/family education;Prosthetic Training   PT Next Visit Plan Review prosthetic care, Continue with gait with prostheses/RW on barriers and level surfaces with crutches towards STGs, outside on grass weather permitting   Consulted and Agree with Plan of Care Patient;Family member/caregiver   Family Member Consulted 2 dtrs        Problem List Patient Active Problem List   Diagnosis Date Noted  . Visit for wound care 07/19/2015  . Status post below knee amputation of right lower extremity ([place]) 05/14/2015  . History of left above knee amputation ([place]) 05/14/2015  . Toe amputation status ([place]) 04/28/2015  . OSA (obstructive sleep apnea) 02/23/2015  . Type 2 diabetes mellitus with other circulatory complications ([place]) 03/16/3357  . HLD (hyperlipidemia) 08/04/2014  . CKD (chronic kidney disease) 06/28/2014  . TIA (transient ischemic attack) 06/27/2014  . OBESITY, UNSPECIFIED 08/17/2010  .  SYNCOPE 08/17/2010  . Hyperlipidemia 08/11/2010  . Essential hypertension 08/11/2010    Jamey Reas PT, DPT 01/31/2016, 9:56 PM  [facility] [address] [place], [place], [address] Phone: [phone]   Fax:  [phone]  Name: RONDO SPITTLER MRN: [id number] Date of Birth: 1951/10/12

## 2016-02-02 ENCOUNTER — Encounter: Payer: BLUE CROSS/BLUE SHIELD | Admitting: Physical Therapy

## 2016-02-03 ENCOUNTER — Encounter: Payer: Self-pay | Admitting: Physical Therapy

## 2016-02-03 ENCOUNTER — Ambulatory Visit: Payer: BLUE CROSS/BLUE SHIELD | Admitting: Physical Therapy

## 2016-02-03 DIAGNOSIS — R2681 Unsteadiness on feet: Secondary | ICD-10-CM

## 2016-02-03 DIAGNOSIS — M6281 Muscle weakness (generalized): Secondary | ICD-10-CM

## 2016-02-03 DIAGNOSIS — R2689 Other abnormalities of gait and mobility: Secondary | ICD-10-CM

## 2016-02-04 NOTE — Therapy (Signed)
[facility] [address] [place], [place], [address] Phone: [phone]   Fax:  [phone]  Physical Therapy Treatment  Patient Details  Name: [name] MRN: [id number] Date of Birth: 05/24/1951 Referring Provider: Rodell Perna, MD  Encounter Date: 02/03/2016      PT End of Session - 02/03/16 1015    Visit Number 11   Number of Visits 24   Date for PT Re-Evaluation 03/17/16   Authorization Type BCBS   PT Start Time 0931   PT Stop Time 1015   PT Time Calculation (min) 44 min   Equipment Utilized During Treatment Gait belt   Activity Tolerance Patient tolerated treatment well   Behavior During Therapy [facility] for tasks assessed/performed      Past Medical History  Diagnosis Date  . Hypertension   . Hyperlipidemia   . Necrosis ([place])     #2 nail   . Myocardial infarction ([place])     "mild" heart attack  . Poor circulation of extremity ([place])   . Sleep apnea     uses cpap, getting a new one  . Type 2 Diabetes mellitus     Type 2  . Arthritis   . Pneumonia     as a child  . CKD (chronic kidney disease) 06/28/2014    Sees Dr Florene Glen  . Cerebrovascular disease     MRI shows Right carotid inferior cavernours narrowing 75% and  50-75% stenosis of Cavernous and supraclinoi right side    Past Surgical History  Procedure Laterality Date  . Above knee leg amputation Left 2004  . Cataract extraction w/phaco  09/05/2012    Procedure: CATARACT EXTRACTION PHACO AND INTRAOCULAR LENS PLACEMENT (IOC);  Surgeon: Tonny Branch, MD;  Location: [facility];  Service: Ophthalmology;  Laterality: Left;  CDE=5.45  . Cataract extraction w/phaco  10/03/2012    Procedure: CATARACT EXTRACTION PHACO AND INTRAOCULAR LENS PLACEMENT (IOC);  Surgeon: Tonny Branch, MD;  Location: [facility];  Service: Ophthalmology;  Laterality: Right;  CDE: 12.31  . Colonoscopy    . Amputation Right 04/28/2015    Procedure: AMPUTATION RAY, RIGHT 5TH TOE;  Surgeon: Marybelle Killings, MD;  Location: [facility];  Service: Orthopedics;  Laterality: Right;  . Amputation Right 05/10/2015    Procedure: Right Below Knee Amputation;  Surgeon: Marybelle Killings, MD;  Location: [facility];  Service: Orthopedics;  Laterality: Right;    There were no vitals filed for this visit.      Subjective Assessment - 02/03/16 0930    Subjective He has not made appointment for alignment check especially AKA with prosthetist yet but plans to call today.    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 No/denies                         [facility] Adult PT Treatment/Exercise - 02/03/16 0930    Transfers   Transfers Sit to Stand;Stand to Sit   Sit to Stand 2: Max assist;4: Min assist;With upper extremity assist;With armrests;From chair/3-in-1  maxA w/c to crutches; MinA chair w/armrests to table   Sit to Stand Details Verbal cues for technique;Visual cues/gestures for sequencing;Tactile cues for weight shifting   Sit to Stand  Details (indicate cue type and reason) PT demo, instructed technique including AKA prosthetic knee control and verbal cues during   Stand to Sit 5: Supervision;With upper extremity assist;With armrests;To chair/3-in-1;4: Min assist  MInA crutches to chair   Stand to Sit Details (indicate cue type and reason) Verbal cues for technique   Stand to Sit Details PT demo, instructed technique including AKA prosthetic knee control and verbal cues during   Ambulation/Gait   Ambulation/Gait Yes   Ambulation/Gait Assistance 5: Supervision   Ambulation/Gait Assistance Details PT demo difference axillary vs forearm crutches. Tactile & verbal cues on weight shift over prosthesis in stance.    Ambulation Distance (Feet)  75 Feet  75' X 2 axillary & 44' X 1 with forearm crutches.    Assistive device Prostheses;Crutches;Lofstrands   Gait Pattern Decreased stride length;Decreased hip/knee flexion - left;Trunk flexed;Step-through pattern   Ambulation Surface Indoor;Level   Stairs Yes   Stairs Assistance 4: Min assist;4: Min guard   Stair Management Technique One rail Left;With cane;Step to pattern;Forwards  lt rail/rt crutch, rt rail/lt crutch, lt rail/rt SBQC   Number of Stairs 4   Ramp 4: Min assist  crutches & prostheses   Ramp Details (indicate cue type and reason) cues on technique with crutches   Curb 4: Min assist  crutches & prostheses   Curb Details (indicate cue type and reason) cues on sequence & technique with crutches   Balance   Balance Assessed --   Dynamic Standing Balance   Dynamic Standing - Balance Support --   Dynamic Standing - Level of Assistance --   Prosthetics   Prosthetic Care Comments  dry limb /liner q4 hrs or prn; signs of sweating   Current prosthetic wear tolerance (days/week)  daily   Current prosthetic wear tolerance (#hours/day)  wearing all awake hours drying q4 hrs    Current prosthetic weight-bearing tolerance (hours/day)  tolerated standing intermittently 30 of 45 minute session without pain   Edema None   Residual limb condition  no issues   Education Provided Residual limb care;Proper wear schedule/adjustment;Correct ply sock adjustment  see prosthetic care   Person(s) Educated Patient;Spouse   Education Method Explanation;Demonstration;Tactile cues;Verbal cues   Education Method Verbalized understanding;Returned demonstration;Verbal cues required;Tactile cues required                  PT Short Term Goals - 02/03/16 1015    PT SHORT TERM GOAL #1   Title Patient donnes bilateral prostheses correctly and verbalizes proper cleaning of prostheses. (Target Date: 01/24/2016)   Baseline MET 01/20/2016   Time 1   Period Months   Status Achieved   PT SHORT  TERM GOAL #2   Title Patient tolerates wear of bilateral prostheses >8 hrs total per day without skin issues or limb tenderness. (Target Date: 01/24/2016)   Baseline MET 01/20/2016   Time 1   Period Months   Status Achieved   PT SHORT TERM GOAL #3   Title Sit to/from stand transfers from w/c to RW with bilateral prostheses with supervision. (Target Date: 01/24/2016)   Baseline MET 01/20/2016   Time 1   Period Months   Status Achieved   PT SHORT TERM GOAL #4   Title Patient reaches 7" anteriorly, laterally, across midline and to floor with RW support with bilateral prostheses with supervision. (Target Date: 01/24/2016) new Target Date 02/18/2016   Time 1   Period Months   Status On-going   PT SHORT TERM GOAL #5   Title  Patient ambulates 78' with RW & bilateral prostheses with minA. (Target Date: 01/24/2016)   Baseline MET 01/20/2016   Time 1   Period Months   Status Achieved   PT SHORT TERM GOAL #6   Title Patient able to sit to/from stand chair without armrests using UEs to device with minA (Target Date 02/18/2016)   Time 4   Period Weeks   Status On-going   PT SHORT TERM GOAL #7   Title Patient able to manage pants with RW support for toileting with supervision. (Target Date 02/18/2016)   Time 4   Period Weeks   Status On-going   PT SHORT TERM GOAL #8   Title Patient ambulates 100' with crutches & prostheses with supervision. (Target Date 02/18/2016)   Status On-going   PT SHORT TERM GOAL #9   TITLE Patient negotiates ramps, curbs with RW and stairs with 1 rail & 1 crutch with prostheses with supervision. (Target Date 02/18/2016)   Time 4   Status On-going           PT Long Term Goals - 01/20/16 1400    PT LONG TERM GOAL #1   Title Patient verbalizes & demonstrates proper prosthetic care to enable safe use of prostheses. (Target Date: 03/17/2016)   Time 12   Period Weeks   Status On-going   PT LONG TERM GOAL #2   Title Patient tolerates wear of bilateral prostheses >90% of  awake hours without skin issues or limb tenderness to enable function throughout his day.  (Target Date: 03/17/2016)   Time 12   Period Weeks   Status On-going   PT LONG TERM GOAL #3   Title Patient able to perform standing balance activities with UE support reaching >10" all directions & to floor modified independent.  (Target Date: 03/17/2016)   Time 12   Period Weeks   Status On-going   PT LONG TERM GOAL #4   Title Patient ambulates 300' with LRAD & bilateral prostheses modified independent to enable community mobility.  (Target Date: 03/17/2016)   Time 12   Period Weeks   Status On-going   PT LONG TERM GOAL #5   Title Patient negotiates ramps, curbs & stairs with LRAD & bilateral prostheses modified independent to enable community mobility.  (Target Date: 03/17/2016)   Time 12   Period Weeks   Status On-going   PT LONG TERM GOAL #6   Title Patient ambulates household around furniture with [facility] & bilateral prostheses modified independent.  (Target Date: 03/17/2016)   Time 12   Period Weeks   Status On-going               Plan - 02/03/16 1015    Clinical Impression Statement Patient appears with more training may benefit from switching from axillary to forearm crutches to improve function. Patient appears to understand sit to stand technique to practice near support structure like table, desk or counter.    Rehab Potential Good   PT Frequency 2x / week   PT Duration 12 weeks   PT Treatment/Interventions ADLs/Self Care Home Management;DME Instruction;Gait training;Stair training;Functional mobility training;Therapeutic activities;Therapeutic exercise;Balance training;Neuromuscular re-education;Patient/family education;Prosthetic Training   PT Next Visit Plan prosthetic gait & balance with forearm crutches including barriers, work on sit to/from stand   Consulted and Agree with Plan of Care Patient;Family member/caregiver   Family Member Consulted wife      Patient will  benefit from skilled therapeutic intervention in order to improve the following deficits and impairments:  Abnormal gait, Decreased activity tolerance, Decreased balance, Decreased knowledge of precautions, Decreased knowledge of use of DME, Decreased mobility, Postural dysfunction, Prosthetic Dependency  Visit Diagnosis: Other abnormalities of gait and mobility  Unsteadiness on feet  Muscle weakness (generalized)     Problem List Patient Active Problem List   Diagnosis Date Noted  . Visit for wound care 07/19/2015  . Status post below knee amputation of right lower extremity ([place]) 05/14/2015  . History of left above knee amputation ([place]) 05/14/2015  . Toe amputation status ([place]) 04/28/2015  . OSA (obstructive sleep apnea) 02/23/2015  . Type 2 diabetes mellitus with other circulatory complications ([place]) 35/46/5681  . HLD (hyperlipidemia) 08/04/2014  . CKD (chronic kidney disease) 06/28/2014  . TIA (transient ischemic attack) 06/27/2014  . OBESITY, UNSPECIFIED 08/17/2010  . SYNCOPE 08/17/2010  . Hyperlipidemia 08/11/2010  . Essential hypertension 08/11/2010    Jamey Reas PT, DPT 02/04/2016, 9:42 AM  [facility] [address] [place], [place], [address] Phone: [phone]   Fax:  [phone]  Name: CALDWELL KRONENBERGER MRN: [id number] Date of Birth: 1951/10/05

## 2016-02-04 NOTE — Addendum Note (Signed)
Addended by: Isaias Cowman on: 02/04/2016 08:47 AM   Modules accepted: Orders

## 2016-02-07 ENCOUNTER — Ambulatory Visit: Payer: BLUE CROSS/BLUE SHIELD | Admitting: Physical Therapy

## 2016-02-07 ENCOUNTER — Encounter: Payer: Self-pay | Admitting: Physical Therapy

## 2016-02-07 DIAGNOSIS — R2681 Unsteadiness on feet: Secondary | ICD-10-CM

## 2016-02-07 DIAGNOSIS — R2689 Other abnormalities of gait and mobility: Secondary | ICD-10-CM | POA: Diagnosis not present

## 2016-02-07 DIAGNOSIS — M6281 Muscle weakness (generalized): Secondary | ICD-10-CM

## 2016-02-07 NOTE — Therapy (Signed)
[facility] [address] [place], [place], [address] Phone: [phone]   Fax:  [phone]  Physical Therapy Treatment  Patient Details  Name: [name] MRN: [id number] Date of Birth: 01/29/1951 Referring Provider: Mark Yates, MD  Encounter Date: 02/07/2016      PT End of Session - 02/07/16 0853    Visit Number 13  number adjusted due to mis number early in episode of care   Number of Visits 24   Date for PT Re-Evaluation 03/17/16   Authorization Type BCBS   PT Start Time 0847   PT Stop Time 0930   PT Time Calculation (min) 43 min   Equipment Utilized During Treatment Gait belt   Activity Tolerance Patient tolerated treatment well   Behavior During Therapy WFL for tasks assessed/performed      Past Medical History  Diagnosis Date  . Hypertension   . Hyperlipidemia   . Necrosis ([facility])     #2 nail   . Myocardial infarction ([facility])     "mild" heart attack  . Poor circulation of extremity ([facility])   . Sleep apnea     uses cpap, getting a new one  . Type 2 Diabetes mellitus     Type 2  . Arthritis   . Pneumonia     as a child  . CKD (chronic kidney disease) 06/28/2014    Sees Dr Powell  . Cerebrovascular disease     MRI shows Right carotid inferior cavernours narrowing 75% and  50-75% stenosis of Cavernous and supraclinoi right side    Past Surgical History  Procedure Laterality Date  . Above knee leg amputation Left 2004  . Cataract extraction w/phaco  09/05/2012    Procedure: CATARACT EXTRACTION PHACO AND INTRAOCULAR LENS PLACEMENT (IOC);  Surgeon: Tom Hunt, MD;  [facility];  Service: Ophthalmology;  Laterality: Left;  CDE=5.45  . Cataract extraction w/phaco  10/03/2012    Procedure: CATARACT EXTRACTION PHACO AND INTRAOCULAR LENS PLACEMENT (IOC);  Surgeon: Tom Hunt, MD;  Location: [facility];  Service: Ophthalmology;  Laterality: Right;  CDE: 12.31  . Colonoscopy    . Amputation Right 04/28/2015   Procedure: AMPUTATION RAY, RIGHT 5TH TOE;  Surgeon: Tom C Yates, MD;  Location[facility];  Service: Orthopedics;  Laterality: Right;  . Amputation Right 05/10/2015    Procedure: Right Below Knee Amputation;  Surgeon: Tom C Yates, MD;  Location: [facility];  Service: Orthopedics;  Laterality: Right;    There were no vitals filed for this visit.      Subjective Assessment - 02/07/16 0851    Subjective No new complaints. No falls or pain to report. 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 See's prosthetist tomorrow ([name]) for prostheses adjustments as needed. Had driving eval last Wed with [name], passed with hand controls. Continues to have difficulty with getting out of car , using slide board most times.    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, church (pastor) stand up to preach,             [facility] Adult PT Treatment/Exercise - 02/07/16 0855    Transfers   Transfers Sit to Stand;Stand to Sit   Sit to Stand 3: Mod assist;4: Min assist   Sit to Stand Details Verbal cues for technique;Visual cues/gestures for sequencing;Tactile cues for weight shifting   Stand to Sit 4: Min guard;4: Min assist;Uncontrolled descent   Stand   to Sit Details (indicate cue type and reason) Verbal cues for technique;Verbal cues for precautions/safety;Verbal cues for safe use of DME/AE   Comments sit<>stand performed from multiple surfaces with cues on sequencing and technique each time. from high to low surfaces without arms rests blocked practice, from a folding chair x 2 reps (mod progressing to min assist) and from a simulated sofa x 2 reps with "armrest" on right side. Walker needed each time as pt places his left hand on the walker and pushes with right hand. Attempted to stand with both hands pushing, unable to achieve standing this way, even with arm rests.                               Ambulation/Gait   Ambulation/Gait  Yes   Ambulation/Gait Assistance 4: Min assist   Ambulation/Gait Assistance Details cues on posture and sequencing.   Ambulation Distance (Feet) 30 Feet   Assistive device Lofstrands;Prostheses   Gait Pattern Decreased stride length;Decreased hip/knee flexion - left;Trunk flexed;Step-through pattern   Ambulation Surface Level;Indoor   Prosthetics   Current prosthetic wear tolerance (days/week)  daily   Current prosthetic wear tolerance (#hours/day)  wearing all awake hours drying q4 hrs    Residual limb condition  pt reports no issues   Education Provided Proper wear schedule/adjustment;Proper weight-bearing schedule/adjustment;Residual limb care   Person(s) Educated Patient;Other (comment)  church member   Education Method Explanation;Demonstration;Verbal cues   Education Method Verbalized understanding   Donning Prosthesis Supervision             PT Short Term Goals - 02/03/16 1015    PT SHORT TERM GOAL #1   Title Patient donnes bilateral prostheses correctly and verbalizes proper cleaning of prostheses. (Target Date: 01/24/2016)   Baseline MET 01/20/2016   Time 1   Period Months   Status Achieved   PT SHORT TERM GOAL #2   Title Patient tolerates wear of bilateral prostheses >8 hrs total per day without skin issues or limb tenderness. (Target Date: 01/24/2016)   Baseline MET 01/20/2016   Time 1   Period Months   Status Achieved   PT SHORT TERM GOAL #3   Title Sit to/from stand transfers from w/c to RW with bilateral prostheses with supervision. (Target Date: 01/24/2016)   Baseline MET 01/20/2016   Time 1   Period Months   Status Achieved   PT SHORT TERM GOAL #4   Title Patient reaches 7" anteriorly, laterally, across midline and to floor with RW support with bilateral prostheses with supervision. (Target Date: 01/24/2016) new Target Date 02/18/2016   Time 1   Period Months   Status On-going   PT SHORT TERM GOAL #5   Title Patient ambulates 100' with RW & bilateral  prostheses with minA. (Target Date: 01/24/2016)   Baseline MET 01/20/2016   Time 1   Period Months   Status Achieved   PT SHORT TERM GOAL #6   Title Patient able to sit to/from stand chair without armrests using UEs to device with minA (Target Date 02/18/2016)   Time 4   Period Weeks   Status On-going   PT SHORT TERM GOAL #7   Title Patient able to manage pants with RW support for toileting with supervision. (Target Date 02/18/2016)   Time 4   Period Weeks   Status On-going   PT SHORT TERM GOAL #8   Title Patient ambulates 100' with crutches & prostheses with supervision. (  Target Date 02/18/2016)   Status On-going   PT SHORT TERM GOAL #9   TITLE Patient negotiates ramps, curbs with RW and stairs with 1 rail & 1 crutch with prostheses with supervision. (Target Date 02/18/2016)   Time 4   Status On-going           PT Long Term Goals - 01/20/16 1400    PT LONG TERM GOAL #1   Title Patient verbalizes & demonstrates proper prosthetic care to enable safe use of prostheses. (Target Date: 03/17/2016)   Time 12   Period Weeks   Status On-going   PT LONG TERM GOAL #2   Title Patient tolerates wear of bilateral prostheses >90% of awake hours without skin issues or limb tenderness to enable function throughout his day.  (Target Date: 03/17/2016)   Time 12   Period Weeks   Status On-going   PT LONG TERM GOAL #3   Title Patient able to perform standing balance activities with UE support reaching >10" all directions & to floor modified independent.  (Target Date: 03/17/2016)   Time 12   Period Weeks   Status On-going   PT LONG TERM GOAL #4   Title Patient ambulates 300' with LRAD & bilateral prostheses modified independent to enable community mobility.  (Target Date: 03/17/2016)   Time 12   Period Weeks   Status On-going   PT LONG TERM GOAL #5   Title Patient negotiates ramps, curbs & stairs with LRAD & bilateral prostheses modified independent to enable community mobility.  (Target Date:  03/17/2016)   Time 12   Period Weeks   Status On-going   PT LONG TERM GOAL #6   Title Patient ambulates household around furniture with [facility] & bilateral prostheses modified independent.  (Target Date: 03/17/2016)   Time 12   Period Weeks   Status On-going           Plan - 02/07/16 7564    Clinical Impression Statement Pt made steady progress with sit<>stands within session. Continues to need RW or stable surface to steady once standing, therefore pt continues to struggle with sit<>stands using crutches. Pt is making steady progress toward goals.    Rehab Potential Good   PT Frequency 2x / week   PT Duration 12 weeks   PT Treatment/Interventions ADLs/Self Care Home Management;DME Instruction;Gait training;Stair training;Functional mobility training;Therapeutic activities;Therapeutic exercise;Balance training;Neuromuscular re-education;Patient/family education;Prosthetic Training   PT Next Visit Plan prosthetic gait & balance with forearm crutches including barriers, work on sit to/from stand   Consulted and Agree with Plan of Care Patient;Family member/caregiver   Family Member Consulted wife      Patient will benefit from skilled therapeutic intervention in order to improve the following deficits and impairments:  Abnormal gait, Decreased activity tolerance, Decreased balance, Decreased knowledge of precautions, Decreased knowledge of use of DME, Decreased mobility, Postural dysfunction, Prosthetic Dependency  Visit Diagnosis: Other abnormalities of gait and mobility  Unsteadiness on feet  Muscle weakness (generalized)     Problem List Patient Active Problem List   Diagnosis Date Noted  . Visit for wound care 07/19/2015  . Status post below knee amputation of right lower extremity ([place]) 05/14/2015  . History of left above knee amputation ([place]) 05/14/2015  . Toe amputation status ([place]) 04/28/2015  . OSA (obstructive sleep apnea) 02/23/2015  . Type 2 diabetes mellitus with  other circulatory complications ([place]) 33/29/5188  . HLD (hyperlipidemia) 08/04/2014  . CKD (chronic kidney disease) 06/28/2014  . TIA (transient ischemic attack) 06/27/2014  .  OBESITY, UNSPECIFIED 08/17/2010  . SYNCOPE 08/17/2010  . Hyperlipidemia 08/11/2010  . Essential hypertension 08/11/2010    [name], PTA, CLT [facility] [address], [address] , [place] [address] [phone] 02/07/2016, 10:10 AM   Name: [name] MRN: [id number] Date of Birth: 03/31/1951     

## 2016-02-09 ENCOUNTER — Encounter: Payer: Self-pay | Admitting: Physical Therapy

## 2016-02-09 ENCOUNTER — Ambulatory Visit: Payer: BLUE CROSS/BLUE SHIELD | Admitting: Physical Therapy

## 2016-02-09 DIAGNOSIS — M6281 Muscle weakness (generalized): Secondary | ICD-10-CM

## 2016-02-09 DIAGNOSIS — R2681 Unsteadiness on feet: Secondary | ICD-10-CM

## 2016-02-09 DIAGNOSIS — R2689 Other abnormalities of gait and mobility: Secondary | ICD-10-CM | POA: Diagnosis not present

## 2016-02-09 NOTE — Therapy (Signed)
[facility] [address] [place], [place], [address] Phone: [phone]   Fax:  [phone]  Physical Therapy Treatment  Patient Details  Name: [name] MRN: [id number] Date of Birth: [birth year] Referring Provider: Rodell Perna, MD  Encounter Date: 02/09/2016      PT End of Session - 02/09/16 2302    Visit Number 14   Number of Visits 24   Date for PT Re-Evaluation 03/17/16   Authorization Type BCBS   PT Start Time 0932   PT Stop Time 1015   PT Time Calculation (min) 43 min   Equipment Utilized During Treatment Gait belt   Activity Tolerance Patient tolerated treatment well   Behavior During Therapy [facility] for tasks assessed/performed      Past Medical History  Diagnosis Date  . Hypertension   . Hyperlipidemia   . Necrosis ([place])     #2 nail   . Myocardial infarction ([place])     "mild" heart attack  . Poor circulation of extremity ([place])   . Sleep apnea     uses cpap, getting a new one  . Type 2 Diabetes mellitus     Type 2  . Arthritis   . Pneumonia     as a child  . CKD (chronic kidney disease) 06/28/2014    Sees Dr Florene Glen  . Cerebrovascular disease     MRI shows Right carotid inferior cavernours narrowing 75% and  50-75% stenosis of Cavernous and supraclinoi right side    Past Surgical History  Procedure Laterality Date  . Above knee leg amputation Left 2004  . Cataract extraction w/phaco  09/05/2012    Procedure: CATARACT EXTRACTION PHACO AND INTRAOCULAR LENS PLACEMENT (IOC);  Surgeon: Tonny Branch, MD;  Location: [facility];  Service: Ophthalmology;  Laterality: Left;  CDE=5.45  . Cataract extraction w/phaco  10/03/2012    Procedure: CATARACT EXTRACTION PHACO AND INTRAOCULAR LENS PLACEMENT (IOC);  Surgeon: Tonny Branch, MD;  Location: [facility];  Service: Ophthalmology;  Laterality: Right;  CDE: 12.31  . Colonoscopy    . Amputation Right 04/28/2015    Procedure: AMPUTATION RAY, RIGHT 5TH TOE;  Surgeon: Marybelle Killings, MD;  Location: [facility];  Service: Orthopedics;  Laterality: Right;  . Amputation Right 05/10/2015    Procedure: Right Below Knee Amputation;  Surgeon: Marybelle Killings, MD;  Location: [facility];  Service: Orthopedics;  Laterality: Right;    There were no vitals filed for this visit.      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 (p) No/denies                         [facility] Adult PT Treatment/Exercise - 02/09/16 0930    Transfers   Transfers Sit to Stand;Stand to Sit   Sit to Stand 4: Min assist;4: Min guard;With upper extremity assist;With armrests;From chair/3-in-1;5: Supervision  to RW (SBA) & to forearm crutches   Sit to Stand Details Verbal cues for technique;Visual cues/gestures for sequencing;Tactile cues for weight shifting   Sit to  Stand Details (indicate cue type and reason) PT demo, instructed in AKA prosthesis extension upon arising prior to shifting forward.    Stand to Sit 4: Min guard;4: Min assist;5: Supervision;With upper extremity assist;With armrests;To chair/3-in-1  from RW (SBA) & from forearm crutches (MinA progressed to CG   Stand to Sit Details (indicate cue type and reason) Verbal cues for technique;Verbal cues for precautions/safety;Verbal cues for safe use of DME/AE   Stand to Sit Details PT instructed, demo technique with forearm crutches   Comments --   Ambulation/Gait   Ambulation/Gait Yes   Ambulation/Gait Assistance 4: Min assist;5: Supervision  initially with MinA progressed to SBA   Ambulation/Gait Assistance Details verbal cues  on weight shift, posture & crutch placement   Ambulation Distance (Feet) 120 Feet  120' X 2   Assistive device Lofstrands;Prostheses   Gait Pattern Decreased stride length;Decreased hip/knee flexion - left;Trunk flexed;Step-through pattern   Ambulation Surface Indoor;Level   Stairs Yes   Stairs Assistance 5: Supervision   Stairs Assistance Details (indicate cue type and reason) verbal cues on technique with 1 rail & 1 crutch   Stair Management Technique One rail Left;With crutches;Step to pattern;Forwards   Number of Stairs 4   Ramp 4: Min assist  forearm crutches & prostheses   Ramp Details (indicate cue type and reason) cues on technique with forearm crutches & prostheses   Curb 4: Min assist  forearm crutches & prostheses   Curb Details (indicate cue type and reason) cues on technique with forearm crutches & prostheses   Prosthetics   Current prosthetic wear tolerance (days/week)  daily   Current prosthetic wear tolerance (#hours/day)  wearing all awake hours drying q4 hrs    Residual limb condition  pt reports no issues   Education Provided Proper wear schedule/adjustment;Residual limb care   Person(s) Educated Patient;Spouse   Education Method Explanation;Verbal cues   Education Method Verbalized understanding;Verbal cues required;Needs further instruction                  PT Short Term Goals - 02/03/16 1015    PT SHORT TERM GOAL #1   Title Patient donnes bilateral prostheses correctly and verbalizes proper cleaning of prostheses. (Target Date: 01/24/2016)   Baseline MET 01/20/2016   Time 1   Period Months   Status Achieved   PT SHORT TERM GOAL #2   Title Patient tolerates wear of bilateral prostheses >8 hrs total per day without skin issues or limb tenderness. (Target Date: 01/24/2016)   Baseline MET 01/20/2016   Time 1   Period Months   Status Achieved   PT SHORT TERM GOAL #3   Title Sit to/from stand transfers from w/c to RW with bilateral prostheses with  supervision. (Target Date: 01/24/2016)   Baseline MET 01/20/2016   Time 1   Period Months   Status Achieved   PT SHORT TERM GOAL #4   Title Patient reaches 7" anteriorly, laterally, across midline and to floor with RW support with bilateral prostheses with supervision. (Target Date: 01/24/2016) new Target Date 02/18/2016   Time 1   Period Months   Status On-going   PT SHORT TERM GOAL #5   Title Patient ambulates 73' with RW & bilateral prostheses with minA. (Target Date: 01/24/2016)   Baseline MET 01/20/2016   Time 1   Period Months   Status Achieved   PT SHORT TERM GOAL #6   Title Patient able to sit to/from stand chair without armrests using UEs to device with  minA (Target Date 02/18/2016)   Time 4   Period Weeks   Status On-going   PT SHORT TERM GOAL #7   Title Patient able to manage pants with RW support for toileting with supervision. (Target Date 02/18/2016)   Time 4   Period Weeks   Status On-going   PT SHORT TERM GOAL #8   Title Patient ambulates 100' with crutches & prostheses with supervision. (Target Date 02/18/2016)   Status On-going   PT SHORT TERM GOAL #9   TITLE Patient negotiates ramps, curbs with RW and stairs with 1 rail & 1 crutch with prostheses with supervision. (Target Date 02/18/2016)   Time 4   Status On-going           PT Long Term Goals - 01/20/16 1400    PT LONG TERM GOAL #1   Title Patient verbalizes & demonstrates proper prosthetic care to enable safe use of prostheses. (Target Date: 03/17/2016)   Time 12   Period Weeks   Status On-going   PT LONG TERM GOAL #2   Title Patient tolerates wear of bilateral prostheses >90% of awake hours without skin issues or limb tenderness to enable function throughout his day.  (Target Date: 03/17/2016)   Time 12   Period Weeks   Status On-going   PT LONG TERM GOAL #3   Title Patient able to perform standing balance activities with UE support reaching >10" all directions & to floor modified independent.  (Target  Date: 03/17/2016)   Time 12   Period Weeks   Status On-going   PT LONG TERM GOAL #4   Title Patient ambulates 300' with LRAD & bilateral prostheses modified independent to enable community mobility.  (Target Date: 03/17/2016)   Time 12   Period Weeks   Status On-going   PT LONG TERM GOAL #5   Title Patient negotiates ramps, curbs & stairs with LRAD & bilateral prostheses modified independent to enable community mobility.  (Target Date: 03/17/2016)   Time 12   Period Weeks   Status On-going   PT LONG TERM GOAL #6   Title Patient ambulates household around furniture with [facility] & bilateral prostheses modified independent.  (Target Date: 03/17/2016)   Time 12   Period Weeks   Status On-going               Plan - 02/09/16 2303    Clinical Impression Statement Patient improved sit to/from stand with skilled instruction in technique. Patient is improving gait with forearm crutches with instruction & repetition. Patient is on target to meet LTGs.    Rehab Potential Good   PT Frequency 2x / week   PT Duration 12 weeks   PT Treatment/Interventions ADLs/Self Care Home Management;DME Instruction;Gait training;Stair training;Functional mobility training;Therapeutic activities;Therapeutic exercise;Balance training;Neuromuscular re-education;Patient/family education;Prosthetic Training   PT Next Visit Plan prosthetic gait & balance with forearm crutches including barriers, work on sit to/from stand   Consulted and Agree with Plan of Care Patient;Family member/caregiver   Family Member Consulted wife      Patient will benefit from skilled therapeutic intervention in order to improve the following deficits and impairments:  Abnormal gait, Decreased activity tolerance, Decreased balance, Decreased knowledge of precautions, Decreased knowledge of use of DME, Decreased mobility, Postural dysfunction, Prosthetic Dependency, Improper body mechanics  Visit Diagnosis: Other abnormalities of gait and  mobility  Unsteadiness on feet  Muscle weakness (generalized)     Problem List Patient Active Problem List   Diagnosis Date Noted  . Visit  for wound care 07/19/2015  . Status post below knee amputation of right lower extremity ([place]) 05/14/2015  . History of left above knee amputation ([place]) 05/14/2015  . Toe amputation status ([place]) 04/28/2015  . OSA (obstructive sleep apnea) 02/23/2015  . Type 2 diabetes mellitus with other circulatory complications ([place]) 84/12/7541  . HLD (hyperlipidemia) 08/04/2014  . CKD (chronic kidney disease) 06/28/2014  . TIA (transient ischemic attack) 06/27/2014  . OBESITY, UNSPECIFIED 08/17/2010  . SYNCOPE 08/17/2010  . Hyperlipidemia 08/11/2010  . Essential hypertension 08/11/2010    Leonce Bale PT, DPT 02/09/2016, 11:08 PM  [facility] [address] [place], [place], [address] Phone: [phone]   Fax:  [phone]  Name: [name] MRN: [id number] Date of Birth: 02/02/1951

## 2016-02-14 ENCOUNTER — Ambulatory Visit: Payer: BLUE CROSS/BLUE SHIELD | Admitting: Family Medicine

## 2016-02-14 ENCOUNTER — Ambulatory Visit (INDEPENDENT_AMBULATORY_CARE_PROVIDER_SITE_OTHER): Payer: BLUE CROSS/BLUE SHIELD | Admitting: Family Medicine

## 2016-02-14 ENCOUNTER — Encounter: Payer: Self-pay | Admitting: Family Medicine

## 2016-02-14 VITALS — BP 136/70 | HR 82 | Temp 97.7°F | Resp 14

## 2016-02-14 DIAGNOSIS — Z89511 Acquired absence of right leg below knee: Secondary | ICD-10-CM

## 2016-02-14 DIAGNOSIS — I10 Essential (primary) hypertension: Secondary | ICD-10-CM

## 2016-02-14 DIAGNOSIS — N184 Chronic kidney disease, stage 4 (severe): Secondary | ICD-10-CM | POA: Diagnosis not present

## 2016-02-14 DIAGNOSIS — Z89612 Acquired absence of left leg above knee: Secondary | ICD-10-CM

## 2016-02-14 NOTE — Assessment & Plan Note (Signed)
His creatinine is up a bit. His wife states that he does not like to drink while he is in public  because he has difficulty getting to the restroom. So this may be a component of dehydration. We'll schedule with his nephrologist to see if the Lasix is needed

## 2016-02-14 NOTE — Assessment & Plan Note (Signed)
Blood pressure well controlled

## 2016-02-14 NOTE — Patient Instructions (Signed)
[name] for [facility]oon for wheelchair [facility] Stop Pletal Appt with Dr. Florene Glen  F/U 4 months

## 2016-02-14 NOTE — Progress Notes (Signed)
Patient ID: [name], male   DOB: 1951-03-23, 65 y.o.   MRN: [id number]    Subjective:    Patient ID: [name], male    DOB: 06-21-1951, 65 y.o.   MRN: [id number]  Patient presents for F/U Patient here for follow-up. He has no particular concerns regarding his health. He is being followed by his specialist. He was last seen by endocrinology as last A1c was 5.9% he was taken off of his insulin.  He has not seen nephrology since last year. He is still on Lasix. I also reviewed his chart and see no specific reason why he is on Pletal as he is a bilateral DVT. He was also on the Lasix because of leg swelling he states.  He now has prosthesis for his left leg. He does need new prescription for a walker as well as a panel for his wheelchair.  I'll review his last set of labs his creatinine was elevated at 1.65 a little bit above his baseline.    Review Of Systems:  GEN- denies fatigue, fever, weight loss,weakness, recent illness HEENT- denies eye drainage, change in vision, nasal discharge, CVS- denies chest pain, palpitations RESP- denies SOB, cough, wheeze ABD- denies N/V, change in stools, abd pain GU- denies dysuria, hematuria, dribbling, incontinence MSK- denies joint pain, muscle aches, injury Neuro- denies headache, dizziness, syncope, seizure activity       Objective:    BP 136/70 mmHg  Pulse 82  Temp(Src) 97.7 F (36.5 C) (Oral)  Resp 14 GEN- NAD, alert and oriented x3,sitting in wheelchair HEENT- PERRL, EOMI, non injected sclera, pink conjunctiva, MMM, oropharynx clear CVS- RRR, no murmur RESP-CTAB EXT- BKA Right leg, AKA, Left  Pulses- Radial 2+        Assessment & Plan:      Problem List Items Addressed This Visit    Status post below knee amputation of right lower extremity ([place])    Bilateral AP dictation of lower extremities. I will discontinue the Pletal Also we will obtain the walker necessary to have him ambulate comfortably with his bilateral  prosthesis. He will also still need a panel in his wheelchair as he is unable to walk very long distances with a walker and at time needs to be wheeled.      History of left above knee amputation ([place])   Essential hypertension    Blood pressure well controlled.      CKD (chronic kidney disease) - Primary (Chronic)    His creatinine is up a bit. His wife states that he does not like to drink while he is in public  because he has difficulty getting to the restroom. So this may be a component of dehydration. We'll schedule with his nephrologist to see if the Lasix is needed          Note: This dictation was prepared with Dragon dictation along with smaller phrase technology. Any transcriptional errors that result from this process are unintentional.

## 2016-02-14 NOTE — Assessment & Plan Note (Signed)
Bilateral AP dictation of lower extremities. I will discontinue the Pletal Also we will obtain the walker necessary to have him ambulate comfortably with his bilateral prosthesis. He will also still need a panel in his wheelchair as he is unable to walk very long distances with a walker and at time needs to be wheeled.

## 2016-02-15 ENCOUNTER — Ambulatory Visit: Payer: BLUE CROSS/BLUE SHIELD | Admitting: Physical Therapy

## 2016-02-15 ENCOUNTER — Encounter: Payer: Self-pay | Admitting: Physical Therapy

## 2016-02-15 DIAGNOSIS — R2689 Other abnormalities of gait and mobility: Secondary | ICD-10-CM | POA: Diagnosis not present

## 2016-02-15 DIAGNOSIS — M6281 Muscle weakness (generalized): Secondary | ICD-10-CM

## 2016-02-15 DIAGNOSIS — R2681 Unsteadiness on feet: Secondary | ICD-10-CM

## 2016-02-16 NOTE — Therapy (Signed)
[facility] [address] [place], [place], [address] Phone: [phone]   Fax:  [phone]  Physical Therapy Treatment  Patient Details  Name: [name] MRN: [id number] Date of Birth: [birth year] Referring Provider: Rodell Perna, MD  Encounter Date: 02/15/2016      PT End of Session - 02/15/16 0930    Visit Number 15   Number of Visits 24   Date for PT Re-Evaluation 03/17/16   Authorization Type BCBS   PT Start Time 0845   PT Stop Time 0929   PT Time Calculation (min) 44 min   Equipment Utilized During Treatment Gait belt   Activity Tolerance Patient tolerated treatment well   Behavior During Therapy [facility] for tasks assessed/performed      Past Medical History  Diagnosis Date  . Hypertension   . Hyperlipidemia   . Necrosis ([place])     #2 nail   . Myocardial infarction ([place])     "mild" heart attack  . Poor circulation of extremity ([place])   . Sleep apnea     uses cpap, getting a new one  . Type 2 Diabetes mellitus     Type 2  . Arthritis   . Pneumonia     as a child  . CKD (chronic kidney disease) 06/28/2014    Sees Dr Florene Glen  . Cerebrovascular disease     MRI shows Right carotid inferior cavernours narrowing 75% and  50-75% stenosis of Cavernous and supraclinoi right side    Past Surgical History  Procedure Laterality Date  . Above knee leg amputation Left 2004  . Cataract extraction w/phaco  09/05/2012    Procedure: CATARACT EXTRACTION PHACO AND INTRAOCULAR LENS PLACEMENT (IOC);  Surgeon: Tonny Branch, MD;  [facility];  Service: Ophthalmology;  Laterality: Left;  CDE=5.45  . Cataract extraction w/phaco  10/03/2012    Procedure: CATARACT EXTRACTION PHACO AND INTRAOCULAR LENS PLACEMENT (IOC);  Surgeon: Tonny Branch, MD;  Location[facility];  Service: Ophthalmology;  Laterality: Right;  CDE: 12.31  . Colonoscopy    . Amputation Right 04/28/2015    Procedure: AMPUTATION RAY, RIGHT 5TH TOE;  Surgeon: Marybelle Killings, MD;  Location: [facility];  Service: Orthopedics;  Laterality: Right;  . Amputation Right 05/10/2015    Procedure: Right Below Knee Amputation;  Surgeon: Marybelle Killings, MD;  Location: [facility];  Service: Orthopedics;  Laterality: Right;    There were no vitals filed for this visit.      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 No/denies     Prosthetic Training with left Transfemoral and right Transtibial amputation prostheses: Patient reports wear all awake hours with some sweating on TTA. PT recommended use of antiperspirant on limb prior to donning prosthesis. Pt & wife verbalized understanding.  Sit to stand from w/c to forearm crutches with maxA. Later in session sit to stand w/c to crutches near counter for security with minA. Stand to sit with contact assist & cues. Patient ambulated 250' including outside on paved surfaces  with forearm crutches with supervision; ramp & curb with minA; grass 15' & gravel 10' with minA. Cues for technique with bil. Prostheses. Patient able to reach 7" with RW support all directions without balance losses and manages pants without difficulty including reaching to floor to pull them up. Car transfer using RW with minA. Patient would benefit from handle insert for door latch and family to purchase one.                                PT Short Term Goals - 02/15/16 0930    PT SHORT TERM GOAL #1   Title Patient donnes bilateral prostheses correctly and verbalizes proper cleaning of  prostheses. (Target Date: 01/24/2016)   Baseline MET 01/20/2016   Time 1   Period Months   Status Achieved   PT SHORT TERM GOAL #2   Title Patient tolerates wear of bilateral prostheses >8 hrs total per day without skin issues or limb tenderness. (Target Date: 01/24/2016)   Baseline MET 01/20/2016   Time 1   Period Months   Status Achieved   PT SHORT TERM GOAL #3   Title Sit to/from stand transfers from w/c to RW with bilateral prostheses with supervision. (Target Date: 01/24/2016)   Baseline MET 01/20/2016   Time 1   Period Months   Status Achieved   PT SHORT TERM GOAL #4   Title Patient reaches 7" anteriorly, laterally, across midline and to floor with RW support with bilateral prostheses with supervision. (Target Date: 01/24/2016) new Target Date 02/18/2016   Baseline MET 02/15/2016   Time 1   Period Months   Status Achieved   PT SHORT TERM GOAL #5   Title Patient ambulates 100' with RW & bilateral prostheses with minA. (Target Date: 01/24/2016)   Baseline MET 01/20/2016   Time 1   Period Months   Status Achieved   PT SHORT TERM GOAL #6   Title Patient able to sit to/from stand chair without armrests using UEs to device with minA (Target Date 02/18/2016)   Time 4   Period Weeks   Status On-going   PT SHORT TERM GOAL #7   Title Patient able to manage pants with RW support for toileting with supervision. (Target Date 02/18/2016)   Baseline MET 02/15/2016   Time 4   Period Weeks   Status Achieved   PT SHORT TERM GOAL #8   Title Patient ambulates 100' with crutches & prostheses with supervision. (Target Date 02/18/2016)   Baseline MET 02/15/2016   Status Achieved   PT SHORT TERM GOAL #9   TITLE Patient negotiates ramps, curbs with RW and stairs with 1 rail & 1 crutch with prostheses with supervision. (Target Date 02/18/2016)   Time 4   Status On-going           PT Long Term Goals - 01/20/16 1400    PT LONG TERM GOAL #1   Title Patient verbalizes & demonstrates proper prosthetic  care to enable safe use of prostheses. (Target Date: 03/17/2016)   Time 12   Period Weeks   Status On-going   PT LONG TERM GOAL #2   Title Patient tolerates wear of bilateral prostheses >90% of awake hours without skin issues or limb tenderness to enable function throughout his day.  (Target Date: 03/17/2016)   Time 12   Period Weeks   Status On-going   PT LONG TERM GOAL #3   Title Patient  able to perform standing balance activities with UE support reaching >10" all directions & to floor modified independent.  (Target Date: 03/17/2016)   Time 12   Period Weeks   Status On-going   PT LONG TERM GOAL #4   Title Patient ambulates 300' with LRAD & bilateral prostheses modified independent to enable community mobility.  (Target Date: 03/17/2016)   Time 12   Period Weeks   Status On-going   PT LONG TERM GOAL #5   Title Patient negotiates ramps, curbs & stairs with LRAD & bilateral prostheses modified independent to enable community mobility.  (Target Date: 03/17/2016)   Time 12   Period Weeks   Status On-going   PT LONG TERM GOAL #6   Title Patient ambulates household around furniture with [facility] & bilateral prostheses modified independent.  (Target Date: 03/17/2016)   Time 12   Period Weeks   Status On-going               Plan - 02/15/16 0930    Clinical Impression Statement Patient met 3 of 5 STGs set for this 30 day period. He is progressing with remaining 2 with anticipation to meet them also. Patient would benefit from car door handle to insert into latch to assist with sit to/from stand. Patient improved sit to stand  with instruction.    Rehab Potential Good   PT Frequency 2x / week   PT Duration 12 weeks   PT Treatment/Interventions ADLs/Self Care Home Management;DME Instruction;Gait training;Stair training;Functional mobility training;Therapeutic activities;Therapeutic exercise;Balance training;Neuromuscular re-education;Patient/family education;Prosthetic Training   PT Next  Visit Plan assess remaining 2 STGs, prosthetic gait & balance with forearm crutches including barriers, work on sit to/from stand   Consulted and Agree with Plan of Care Patient;Family member/caregiver   Family Member Consulted wife      Patient will benefit from skilled therapeutic intervention in order to improve the following deficits and impairments:  Abnormal gait, Decreased activity tolerance, Decreased balance, Decreased knowledge of precautions, Decreased knowledge of use of DME, Decreased mobility, Postural dysfunction, Prosthetic Dependency, Improper body mechanics  Visit Diagnosis: Other abnormalities of gait and mobility  Unsteadiness on feet  Muscle weakness (generalized)     Problem List Patient Active Problem List   Diagnosis Date Noted  . Visit for wound care 07/19/2015  . Status post below knee amputation of right lower extremity ([place]) 05/14/2015  . History of left above knee amputation ([place]) 05/14/2015  . Toe amputation status ([place]) 04/28/2015  . OSA (obstructive sleep apnea) 02/23/2015  . Type 2 diabetes mellitus with other circulatory complications ([place]) 19/50/9326  . HLD (hyperlipidemia) 08/04/2014  . CKD (chronic kidney disease) 06/28/2014  . TIA (transient ischemic attack) 06/27/2014  . OBESITY, UNSPECIFIED 08/17/2010  . SYNCOPE 08/17/2010  . Hyperlipidemia 08/11/2010  . Essential hypertension 08/11/2010    Jamey Reas PT, DPT 02/16/2016, 7:15 AM  [facility] [address] [place], [place], [address] Phone: [phone]   Fax:  [phone]  Name: [name] MRN: [id number] Date of Birth: 09-30-1951

## 2016-02-21 ENCOUNTER — Ambulatory Visit: Payer: BLUE CROSS/BLUE SHIELD | Admitting: Physical Therapy

## 2016-02-21 ENCOUNTER — Telehealth: Payer: Self-pay | Admitting: Family Medicine

## 2016-02-21 ENCOUNTER — Encounter: Payer: Self-pay | Admitting: Physical Therapy

## 2016-02-21 DIAGNOSIS — R2681 Unsteadiness on feet: Secondary | ICD-10-CM

## 2016-02-21 DIAGNOSIS — M6281 Muscle weakness (generalized): Secondary | ICD-10-CM

## 2016-02-21 DIAGNOSIS — R2689 Other abnormalities of gait and mobility: Secondary | ICD-10-CM | POA: Diagnosis not present

## 2016-02-21 NOTE — Telephone Encounter (Signed)
To MD

## 2016-02-21 NOTE — Telephone Encounter (Signed)
[name] @ [facility] wanted to inform Dr. Buelah Manis that they have scheduled him on 5/31 and have put him on a cancellation list.

## 2016-02-21 NOTE — Therapy (Signed)
[facility] [address] [place], [place], [address] Phone: [phone]   Fax:  [phone]  Physical Therapy Treatment  Patient Details  Name: [name] MRN: [id number] Date of Birth: 11-04-1950 Referring Provider: Rodell Perna, MD  Encounter Date: 02/21/2016      PT End of Session - 02/21/16 1328    Visit Number 16   Number of Visits 24   Date for PT Re-Evaluation 03/17/16   Authorization Type BCBS   PT Start Time 0845   PT Stop Time 0929   PT Time Calculation (min) 44 min   Equipment Utilized During Treatment Gait belt   Activity Tolerance Patient tolerated treatment well   Behavior During Therapy [facility] for tasks assessed/performed      Past Medical History  Diagnosis Date  . Hypertension   . Hyperlipidemia   . Necrosis ([place])     #2 nail   . Myocardial infarction ([place])     "mild" heart attack  . Poor circulation of extremity ([place])   . Sleep apnea     uses cpap, getting a new one  . Type 2 Diabetes mellitus     Type 2  . Arthritis   . Pneumonia     as a child  . CKD (chronic kidney disease) 06/28/2014    Sees Dr Florene Glen  . Cerebrovascular disease     MRI shows Right carotid inferior cavernours narrowing 75% and  50-75% stenosis of Cavernous and supraclinoi right side    Past Surgical History  Procedure Laterality Date  . Above knee leg amputation Left 2004  . Cataract extraction w/phaco  09/05/2012    Procedure: CATARACT EXTRACTION PHACO AND INTRAOCULAR LENS PLACEMENT (IOC);  Surgeon: Tonny Branch, MD;  Location: [facility];  Service: Ophthalmology;  Laterality: Left;  CDE=5.45  . Cataract extraction w/phaco  10/03/2012    Procedure: CATARACT EXTRACTION PHACO AND INTRAOCULAR LENS PLACEMENT (IOC);  Surgeon: Tonny Branch, MD;  Location: [facility];  Service: Ophthalmology;  Laterality: Right;  CDE: 12.31  . Colonoscopy    . Amputation Right 04/28/2015    Procedure: AMPUTATION RAY, RIGHT 5TH TOE;  Surgeon: Marybelle Killings, MD;  Location: [facility];  Service: Orthopedics;  Laterality: Right;  . Amputation Right 05/10/2015    Procedure: Right Below Knee Amputation;  Surgeon: Marybelle Killings, MD;  Location: [facility];  Service: Orthopedics;  Laterality: Right;    There were no vitals filed for this visit.      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 No/denies                         [facility] Adult PT Treatment/Exercise - 02/21/16 0845    Transfers   Transfers Sit to Stand;Stand to Sit;Floor to Transfer   Sit to Stand 4: Min assist;4: Min guard;With upper extremity assist;With armrests;From chair/3-in-1;5: Supervision  to forearm crutches   Sit to Stand Details Verbal cues for technique;Visual cues/gestures for sequencing;Tactile cues for weight shifting   Stand to Sit 4: Min guard;4: Min assist;5:  Supervision;With upper extremity assist;With armrests;To chair/3-in-1  from RW (SBA) & from forearm crutches (MinA progressed to CG   Stand to Sit Details (indicate cue type and reason) Verbal cues for technique;Verbal cues for precautions/safety;Verbal cues for safe use of DME/AE   Floor to Transfer 4: Min assist;With upper extremity assist   Floor to Transfer Details (indicate cue type and reason) quadriped & crawl to mat table with verbal cues; pushed with UEs on mat table with minA with PT cueing technique.    Ambulation/Gait   Ambulation/Gait Yes   Ambulation/Gait Assistance 5: Supervision   Ambulation/Gait Assistance Details verbal cues on width of crutches to allow enough space for feet and  upright posture   Ambulation Distance (Feet) 120 Feet  120' X 3   Assistive device Lofstrands;Prostheses   Gait Pattern Decreased stride length;Decreased hip/knee flexion - left;Trunk flexed;Step-through pattern   Ambulation Surface Indoor;Level   Stairs Yes   Stairs Assistance 5: Supervision   Stairs Assistance Details (indicate cue type and reason) verbal cues on crutch movement and length of rail at top & bottom if adding to pulpit area   Stair Management Technique One rail Left;With crutches;Step to pattern;Forwards;One rail Right   Number of Stairs 4   Ramp 4: Min assist  forearm crutches & prostheses   Ramp Details (indicate cue type and reason) cues on step length, posture & wt shift   Curb 3: Mod assist  forearm crutches & prostheses   Curb Details (indicate cue type and reason) pt fell (PT assisted to floor with gait belt) first attempt to ascend when he shifted wt left to TFA after knee was unlocked. PT demo proper technique including wt shift & pt performed 2nd attempt with verbal cues.    Posture/Postural Control   Posture Comments standing with left prosthetic foot ~2" back to right to facilitate TFA knee extension for stability.    Dynamic Standing Balance   Reaching for objects comments: reaches with single crutch support 5" with cues.    Prosthetics   Current prosthetic wear tolerance (days/week)  daily   Current prosthetic wear tolerance (#hours/day)  wearing all awake hours drying q4 hrs    Residual limb condition  pt reports no issues   Education Provided --                  PT Short Term Goals - 02/21/16 1328    PT SHORT TERM GOAL #1   Title Patient donnes bilateral prostheses correctly and verbalizes proper cleaning of prostheses. (Target Date: 01/24/2016)   Baseline MET 01/20/2016   Time 1   Period Months   Status Achieved   PT SHORT TERM GOAL #2   Title Patient tolerates wear of bilateral prostheses >8 hrs total per day without skin issues or limb  tenderness. (Target Date: 01/24/2016)   Baseline MET 01/20/2016   Time 1   Period Months   Status Achieved   PT SHORT TERM GOAL #3   Title Sit to/from stand transfers from w/c to RW with bilateral prostheses with supervision. (Target Date: 01/24/2016)   Baseline MET 01/20/2016   Time 1   Period Months   Status Achieved   PT SHORT TERM GOAL #4   Title Patient reaches 7" anteriorly, laterally, across midline and to floor with RW support with bilateral prostheses with supervision. (Target Date: 01/24/2016) new Target Date 02/18/2016   Baseline MET 02/15/2016   Time 1   Period Months   Status Achieved   PT SHORT  TERM GOAL #5   Title Patient ambulates 100' with RW & bilateral prostheses with minA. (Target Date: 01/24/2016)   Baseline MET 01/20/2016   Time 1   Period Months   Status Achieved   PT SHORT TERM GOAL #6   Title Patient able to sit to/from stand chair without armrests using UEs to device with minA (Target Date 02/18/2016)   Baseline MET 02/21/2016 to RW   Time 4   Period Weeks   Status Achieved   PT SHORT TERM GOAL #7   Title Patient able to manage pants with RW support for toileting with supervision. (Target Date 02/18/2016)   Baseline MET 02/15/2016   Time 4   Period Weeks   Status Achieved   PT SHORT TERM GOAL #8   Title Patient ambulates 100' with crutches & prostheses with supervision. (Target Date 02/18/2016)   Baseline MET 02/15/2016   Status Achieved   PT SHORT TERM GOAL #9   TITLE Patient negotiates ramps, curbs with RW and stairs with 1 rail & 1 crutch with prostheses with supervision. (Target Date 02/18/2016)   Baseline MET with RW 02/21/2016   Time 4   Status Achieved           PT Long Term Goals - 01/20/16 1400    PT LONG TERM GOAL #1   Title Patient verbalizes & demonstrates proper prosthetic care to enable safe use of prostheses. (Target Date: 03/17/2016)   Time 12   Period Weeks   Status On-going   PT LONG TERM GOAL #2   Title Patient tolerates wear of  bilateral prostheses >90% of awake hours without skin issues or limb tenderness to enable function throughout his day.  (Target Date: 03/17/2016)   Time 12   Period Weeks   Status On-going   PT LONG TERM GOAL #3   Title Patient able to perform standing balance activities with UE support reaching >10" all directions & to floor modified independent.  (Target Date: 03/17/2016)   Time 12   Period Weeks   Status On-going   PT LONG TERM GOAL #4   Title Patient ambulates 300' with LRAD & bilateral prostheses modified independent to enable community mobility.  (Target Date: 03/17/2016)   Time 12   Period Weeks   Status On-going   PT LONG TERM GOAL #5   Title Patient negotiates ramps, curbs & stairs with LRAD & bilateral prostheses modified independent to enable community mobility.  (Target Date: 03/17/2016)   Time 12   Period Weeks   Status On-going   PT LONG TERM GOAL #6   Title Patient ambulates household around furniture with [facility] & bilateral prostheses modified independent.  (Target Date: 03/17/2016)   Time 12   Period Weeks   Status On-going               Plan - 02/21/16 1330    Clinical Impression Statement Patient improved sit to /from stand to crutches with chairs with armrests with instruction. Patient improved ability to negotiate curb with instruction after controlled fall with first attempt.    Rehab Potential Good   PT Frequency 2x / week   PT Duration 12 weeks   PT Treatment/Interventions ADLs/Self Care Home Management;DME Instruction;Gait training;Stair training;Functional mobility training;Therapeutic activities;Therapeutic exercise;Balance training;Neuromuscular re-education;Patient/family education;Prosthetic Training   PT Next Visit Plan work towards LTGs, prosthetic gait & balance with forearm crutches including barriers, work on sit to/from stand   Consulted and Agree with Plan of Care Patient;Family member/caregiver   Family  Member Consulted wife      Patient  will benefit from skilled therapeutic intervention in order to improve the following deficits and impairments:  Abnormal gait, Decreased activity tolerance, Decreased balance, Decreased knowledge of precautions, Decreased knowledge of use of DME, Decreased mobility, Postural dysfunction, Prosthetic Dependency, Improper body mechanics  Visit Diagnosis: Other abnormalities of gait and mobility  Unsteadiness on feet  Muscle weakness (generalized)     Problem List Patient Active Problem List   Diagnosis Date Noted  . Visit for wound care 07/19/2015  . Status post below knee amputation of right lower extremity ([place]) 05/14/2015  . History of left above knee amputation ([place]) 05/14/2015  . Toe amputation status ([place]) 04/28/2015  . OSA (obstructive sleep apnea) 02/23/2015  . Type 2 diabetes mellitus with other circulatory complications ([place]) 26/41/5830  . HLD (hyperlipidemia) 08/04/2014  . CKD (chronic kidney disease) 06/28/2014  . TIA (transient ischemic attack) 06/27/2014  . OBESITY, UNSPECIFIED 08/17/2010  . SYNCOPE 08/17/2010  . Hyperlipidemia 08/11/2010  . Essential hypertension 08/11/2010    Jamey Reas PT, DPT 02/21/2016, 1:34 PM  [facility] [address] [place], [place], [address] Phone: [phone]   Fax:  [phone]  Name: [name] MRN: [id number] Date of Birth: Jan 25, 1951

## 2016-02-24 ENCOUNTER — Encounter: Payer: Self-pay | Admitting: Physical Therapy

## 2016-02-24 ENCOUNTER — Ambulatory Visit: Payer: BLUE CROSS/BLUE SHIELD | Admitting: Physical Therapy

## 2016-02-24 DIAGNOSIS — R2689 Other abnormalities of gait and mobility: Secondary | ICD-10-CM

## 2016-02-24 DIAGNOSIS — M6281 Muscle weakness (generalized): Secondary | ICD-10-CM

## 2016-02-24 DIAGNOSIS — R2681 Unsteadiness on feet: Secondary | ICD-10-CM

## 2016-02-24 NOTE — Therapy (Signed)
[facility] [address] [place], [place], [address] Phone: [phone]   Fax:  [phone]  Physical Therapy Treatment  Patient Details  Name: [name] MRN: [id number] Date of Birth: [birth year] Referring Provider: Rodell Perna, MD  Encounter Date: 02/24/2016      PT End of Session - 02/24/16 1310    Visit Number 17   Number of Visits 24   Date for PT Re-Evaluation 03/17/16   Authorization Type BCBS   PT Start Time 0934   PT Stop Time 1015   PT Time Calculation (min) 41 min   Equipment Utilized During Treatment Gait belt   Activity Tolerance Patient tolerated treatment well   Behavior During Therapy [facility] for tasks assessed/performed      Past Medical History  Diagnosis Date  . Hypertension   . Hyperlipidemia   . Necrosis ([place])     #2 nail   . Myocardial infarction ([place])     "mild" heart attack  . Poor circulation of extremity ([place])   . Sleep apnea     uses cpap, getting a new one  . Type 2 Diabetes mellitus     Type 2  . Arthritis   . Pneumonia     as a child  . CKD (chronic kidney disease) 06/28/2014    Sees Dr Florene Glen  . Cerebrovascular disease     MRI shows Right carotid inferior cavernours narrowing 75% and  50-75% stenosis of Cavernous and supraclinoi right side    Past Surgical History  Procedure Laterality Date  . Above knee leg amputation Left 2004  . Cataract extraction w/phaco  09/05/2012    Procedure: CATARACT EXTRACTION PHACO AND INTRAOCULAR LENS PLACEMENT (IOC);  Surgeon: Tonny Branch, MD;  Location: [facility];  Service: Ophthalmology;  Laterality: Left;  CDE=5.45  . Cataract extraction w/phaco  10/03/2012    Procedure: CATARACT EXTRACTION PHACO AND INTRAOCULAR LENS PLACEMENT (IOC);  Surgeon: Tonny Branch, MD;  Location: [facility];  Service: Ophthalmology;  Laterality: Right;  CDE: 12.31  . Colonoscopy    . Amputation Right 04/28/2015    Procedure: AMPUTATION RAY, RIGHT 5TH TOE;  Surgeon: Marybelle Killings, MD;  Location: [facility];  Service: Orthopedics;  Laterality: Right;  . Amputation Right 05/10/2015    Procedure: Right Below Knee Amputation;  Surgeon: Marybelle Killings, MD;  Location: [facility];  Service: Orthopedics;  Laterality: Right;    There were no vitals filed for this visit.      Subjective Assessment - 02/24/16 1304    Subjective Patient recieved a used rolling walker as gift from friend that is shaky and unstable. 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 No/denies                         [facility] Adult PT Treatment/Exercise - 02/24/16 0930    Transfers   Transfers Sit to Stand;Stand to Sit;Floor to Transfer   Sit to Stand 4: Min assist;4: Min guard;With upper extremity assist;With armrests;From chair/3-in-1;5: Supervision  to forearm crutches   Sit to Stand Details Verbal cues for technique;Visual cues/gestures for sequencing;Tactile cues for weight shifting   Stand to Sit 4:  Min guard;4: Min assist;5: Supervision;With upper extremity assist;With armrests;To chair/3-in-1  from RW (SBA) & from forearm crutches (MinA progressed to CG   Stand to Sit Details (indicate cue type and reason) Verbal cues for technique;Verbal cues for precautions/safety;Verbal cues for safe use of DME/AE   Floor to Transfer --   Ambulation/Gait   Ambulation/Gait Yes   Ambulation/Gait Assistance 5: Supervision   Ambulation/Gait Assistance Details PT instructed in 4 point pattern with forearm crutches along with rationale for improved balance and number of steps over given distance.    Ambulation Distance (Feet) 135 Feet  120' with RW, 135' & 30' with forearm crutches   Assistive device  Lofstrands;Prostheses   Gait Pattern Decreased stride length;Decreased hip/knee flexion - left;Trunk flexed;Step-through pattern   Stairs --   Stairs Assistance --   Stair Management Technique --   Number of Stairs --   Ramp 4: Min assist  forearm crutches & prostheses   Ramp Details (indicate cue type and reason) cues on technique with crutches   Curb 4: Min assist  forearm crutches & prostheses   Curb Details (indicate cue type and reason) cues on sequence & technique with crutches.    Posture/Postural Control   Posture Comments --   Dynamic Standing Balance   Reaching for objects comments: --   Prosthetics   Current prosthetic wear tolerance (days/week)  daily   Current prosthetic wear tolerance (#hours/day)  wearing all awake hours drying q4 hrs    Residual limb condition  pt reports no issues     PT adjusted RW received from [facility].           PT Education - 02/24/16 0930    Education provided Yes   Education Details seated 4 point sequencing exercise,    Person(s) Educated Patient;Spouse   Methods Explanation;Demonstration;Tactile cues;Verbal cues  numbered cards for order   Comprehension Verbalized understanding;Returned demonstration;Verbal cues required;Tactile cues required;Need further instruction          PT Short Term Goals - 02/21/16 1328    PT SHORT TERM GOAL #1   Title Patient donnes bilateral prostheses correctly and verbalizes proper cleaning of prostheses. (Target Date: 01/24/2016)   Baseline MET 01/20/2016   Time 1   Period Months   Status Achieved   PT SHORT TERM GOAL #2   Title Patient tolerates wear of bilateral prostheses >8 hrs total per day without skin issues or limb tenderness. (Target Date: 01/24/2016)   Baseline MET 01/20/2016   Time 1   Period Months   Status Achieved   PT SHORT TERM GOAL #3   Title Sit to/from stand transfers from w/c to RW with bilateral prostheses with supervision. (Target Date: 01/24/2016)   Baseline  MET 01/20/2016   Time 1   Period Months   Status Achieved   PT SHORT TERM GOAL #4   Title Patient reaches 7" anteriorly, laterally, across midline and to floor with RW support with bilateral prostheses with supervision. (Target Date: 01/24/2016) new Target Date 02/18/2016   Baseline MET 02/15/2016   Time 1   Period Months   Status Achieved   PT SHORT TERM GOAL #5   Title Patient ambulates 100' with RW & bilateral prostheses with minA. (Target Date: 01/24/2016)   Baseline MET 01/20/2016   Time 1   Period Months   Status Achieved   PT SHORT TERM GOAL #6   Title Patient able to sit to/from stand chair without armrests using UEs to device with minA (Target Date  02/18/2016)   Baseline MET 02/21/2016 to RW   Time 4   Period Weeks   Status Achieved   PT SHORT TERM GOAL #7   Title Patient able to manage pants with RW support for toileting with supervision. (Target Date 02/18/2016)   Baseline MET 02/15/2016   Time 4   Period Weeks   Status Achieved   PT SHORT TERM GOAL #8   Title Patient ambulates 100' with crutches & prostheses with supervision. (Target Date 02/18/2016)   Baseline MET 02/15/2016   Status Achieved   PT SHORT TERM GOAL #9   TITLE Patient negotiates ramps, curbs with RW and stairs with 1 rail & 1 crutch with prostheses with supervision. (Target Date 02/18/2016)   Baseline MET with RW 02/21/2016   Time 4   Status Achieved           PT Long Term Goals - 01/20/16 1400    PT LONG TERM GOAL #1   Title Patient verbalizes & demonstrates proper prosthetic care to enable safe use of prostheses. (Target Date: 03/17/2016)   Time 12   Period Weeks   Status On-going   PT LONG TERM GOAL #2   Title Patient tolerates wear of bilateral prostheses >90% of awake hours without skin issues or limb tenderness to enable function throughout his day.  (Target Date: 03/17/2016)   Time 12   Period Weeks   Status On-going   PT LONG TERM GOAL #3   Title Patient able to perform standing balance  activities with UE support reaching >10" all directions & to floor modified independent.  (Target Date: 03/17/2016)   Time 12   Period Weeks   Status On-going   PT LONG TERM GOAL #4   Title Patient ambulates 300' with LRAD & bilateral prostheses modified independent to enable community mobility.  (Target Date: 03/17/2016)   Time 12   Period Weeks   Status On-going   PT LONG TERM GOAL #5   Title Patient negotiates ramps, curbs & stairs with LRAD & bilateral prostheses modified independent to enable community mobility.  (Target Date: 03/17/2016)   Time 12   Period Weeks   Status On-going   PT LONG TERM GOAL #6   Title Patient ambulates household around furniture with [facility] & bilateral prostheses modified independent.  (Target Date: 03/17/2016)   Time 12   Period Weeks   Status On-going               Plan - 02/24/16 1310    Clinical Impression Statement Patient had improved step thru pattern with 4 point gait compared to 3 point gait with forearm crutches. Patient needed less assitance to arise & stabilize with forearm crutches.    Rehab Potential Good   PT Frequency 2x / week   PT Duration 12 weeks   PT Treatment/Interventions ADLs/Self Care Home Management;DME Instruction;Gait training;Stair training;Functional mobility training;Therapeutic activities;Therapeutic exercise;Balance training;Neuromuscular re-education;Patient/family education;Prosthetic Training   PT Next Visit Plan work towards LTGs, prosthetic gait & balance with forearm crutches including barriers, work on sit to/from stand   Consulted and Agree with Plan of Care Patient;Family member/caregiver   Family Member Consulted wife      Patient will benefit from skilled therapeutic intervention in order to improve the following deficits and impairments:  Abnormal gait, Decreased activity tolerance, Decreased balance, Decreased knowledge of precautions, Decreased knowledge of use of DME, Decreased mobility, Postural  dysfunction, Prosthetic Dependency, Improper body mechanics  Visit Diagnosis: Other abnormalities of gait and mobility  Unsteadiness on feet  Muscle weakness (generalized)     Problem List Patient Active Problem List   Diagnosis Date Noted  . Visit for wound care 07/19/2015  . Status post below knee amputation of right lower extremity ([place]) 05/14/2015  . History of left above knee amputation ([place]) 05/14/2015  . Toe amputation status ([place]) 04/28/2015  . OSA (obstructive sleep apnea) 02/23/2015  . Type 2 diabetes mellitus with other circulatory complications ([place]) 25/50/0164  . HLD (hyperlipidemia) 08/04/2014  . CKD (chronic kidney disease) 06/28/2014  . TIA (transient ischemic attack) 06/27/2014  . OBESITY, UNSPECIFIED 08/17/2010  . SYNCOPE 08/17/2010  . Hyperlipidemia 08/11/2010  . Essential hypertension 08/11/2010    Jamey Reas PT, DPT 02/24/2016, 1:12 PM  [facility] [address] [place], [place], [address] Phone: [phone]   Fax:  [phone]  Name: [name] MRN: [id number] Date of Birth: [birth year]

## 2016-02-28 ENCOUNTER — Ambulatory Visit: Payer: BLUE CROSS/BLUE SHIELD | Attending: Orthopaedic Surgery | Admitting: Physical Therapy

## 2016-02-28 ENCOUNTER — Encounter: Payer: Self-pay | Admitting: Physical Therapy

## 2016-02-28 DIAGNOSIS — R269 Unspecified abnormalities of gait and mobility: Secondary | ICD-10-CM | POA: Insufficient documentation

## 2016-02-28 DIAGNOSIS — R2681 Unsteadiness on feet: Secondary | ICD-10-CM | POA: Diagnosis present

## 2016-02-28 DIAGNOSIS — R2689 Other abnormalities of gait and mobility: Secondary | ICD-10-CM | POA: Diagnosis present

## 2016-02-28 DIAGNOSIS — M6281 Muscle weakness (generalized): Secondary | ICD-10-CM

## 2016-02-29 NOTE — Therapy (Signed)
[facility] [address] [place], [place], [address] Phone: [phone]   Fax:  [phone]  Physical Therapy Treatment  Patient Details  Name: [name] MRN: [id number] Date of Birth: [birth year] Referring Provider: Rodell Perna, MD  Encounter Date: 02/28/2016      PT End of Session - 02/28/16 1400    Visit Number 18   Number of Visits 24   Date for PT Re-Evaluation 03/17/16   Authorization Type BCBS   PT Start Time 1103   PT Stop Time 1145   PT Time Calculation (min) 42 min   Equipment Utilized During Treatment Gait belt   Activity Tolerance Patient tolerated treatment well   Behavior During Therapy [facility] for tasks assessed/performed      Past Medical History  Diagnosis Date  . Hypertension   . Hyperlipidemia   . Necrosis ([place])     #2 nail   . Myocardial infarction ([place])     "mild" heart attack  . Poor circulation of extremity ([place])   . Sleep apnea     uses cpap, getting a new one  . Type 2 Diabetes mellitus     Type 2  . Arthritis   . Pneumonia     as a child  . CKD (chronic kidney disease) 06/28/2014    Sees Dr Florene Glen  . Cerebrovascular disease     MRI shows Right carotid inferior cavernours narrowing 75% and  50-75% stenosis of Cavernous and supraclinoi right side    Past Surgical History  Procedure Laterality Date  . Above knee leg amputation Left 2004  . Cataract extraction w/phaco  09/05/2012    Procedure: CATARACT EXTRACTION PHACO AND INTRAOCULAR LENS PLACEMENT (IOC);  Surgeon: Tonny Branch, MD;  Location: [facility];  Service: Ophthalmology;  Laterality: Left;  CDE=5.45  . Cataract extraction w/phaco  10/03/2012    Procedure: CATARACT EXTRACTION PHACO AND INTRAOCULAR LENS PLACEMENT (IOC);  Surgeon: Tonny Branch, MD;  Location: [facility];  Service: Ophthalmology;  Laterality: Right;  CDE: 12.31  . Colonoscopy    . Amputation Right 04/28/2015    Procedure: AMPUTATION RAY, RIGHT 5TH TOE;  Surgeon: Marybelle Killings, MD;  Location: [facility];  Service: Orthopedics;  Laterality: Right;  . Amputation Right 05/10/2015    Procedure: Right Below Knee Amputation;  Surgeon: Marybelle Killings, MD;  Location: [facility];  Service: Orthopedics;  Laterality: Right;    There were no vitals filed for this visit.      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 No/denies                         [facility] Adult PT Treatment/Exercise - 02/28/16 1100    Transfers   Transfers Sit to Stand;Stand to Sit;Floor to Transfer   Sit to Stand 4: Min assist;With upper extremity assist;With armrests;From chair/3-in-1  to forearm crutches   Sit to Stand Details Verbal cues for technique;Visual cues/gestures for sequencing;Tactile cues for weight shifting   Stand to Sit 4: Min guard;5: Supervision;With upper extremity assist;With armrests;To chair/3-in-1  from forearm crutches (MinA progressed to CG  Stand to Sit Details (indicate cue type and reason) Verbal cues for technique;Verbal cues for precautions/safety;Verbal cues for safe use of DME/AE   Ambulation/Gait   Ambulation/Gait Yes   Ambulation/Gait Assistance 5: Supervision   Ambulation/Gait Assistance Details verbal cues on 4-pt gait pattern & crutch placement   Ambulation Distance (Feet) 200 Feet  200' & 80' indoors, 100' outdoors   Assistive device Lofstrands;Prostheses   Gait Pattern Decreased stride length;Decreased hip/knee flexion - left;Trunk flexed;Step-through pattern   Ambulation Surface Indoor;Level;Outdoor;Unlevel;Gravel;Paved;Grass   Stairs Yes   Stairs Assistance 5: Supervision   Stair  Management Technique One rail Left;With cane;Step to pattern;Forwards   Number of Stairs 4   Ramp 4: Min assist  forearm crutches & prostheses   Ramp Details (indicate cue type and reason) cues on technique & wt shift   Curb 4: Min assist  forearm crutches & prostheses   Curb Details (indicate cue type and reason) cues on technique   Prosthetics   Current prosthetic wear tolerance (days/week)  daily   Current prosthetic wear tolerance (#hours/day)  wearing all awake hours drying q4 hrs    Residual limb condition  pt reports no issues                  PT Short Term Goals - 02/21/16 1328    PT SHORT TERM GOAL #1   Title Patient donnes bilateral prostheses correctly and verbalizes proper cleaning of prostheses. (Target Date: 01/24/2016)   Baseline MET 01/20/2016   Time 1   Period Months   Status Achieved   PT SHORT TERM GOAL #2   Title Patient tolerates wear of bilateral prostheses >8 hrs total per day without skin issues or limb tenderness. (Target Date: 01/24/2016)   Baseline MET 01/20/2016   Time 1   Period Months   Status Achieved   PT SHORT TERM GOAL #3   Title Sit to/from stand transfers from w/c to RW with bilateral prostheses with supervision. (Target Date: 01/24/2016)   Baseline MET 01/20/2016   Time 1   Period Months   Status Achieved   PT SHORT TERM GOAL #4   Title Patient reaches 7" anteriorly, laterally, across midline and to floor with RW support with bilateral prostheses with supervision. (Target Date: 01/24/2016) new Target Date 02/18/2016   Baseline MET 02/15/2016   Time 1   Period Months   Status Achieved   PT SHORT TERM GOAL #5   Title Patient ambulates 100' with RW & bilateral prostheses with minA. (Target Date: 01/24/2016)   Baseline MET 01/20/2016   Time 1   Period Months   Status Achieved   PT SHORT TERM GOAL #6   Title Patient able to sit to/from stand chair without armrests using UEs to device with minA (Target Date 02/18/2016)   Baseline MET  02/21/2016 to RW   Time 4   Period Weeks   Status Achieved   PT SHORT TERM GOAL #7   Title Patient able to manage pants with RW support for toileting with supervision. (Target Date 02/18/2016)   Baseline MET 02/15/2016   Time 4   Period Weeks   Status Achieved   PT SHORT TERM GOAL #8   Title Patient ambulates 100' with crutches & prostheses with supervision. (Target Date 02/18/2016)   Baseline MET 02/15/2016   Status Achieved   PT SHORT TERM GOAL #9   TITLE Patient negotiates ramps, curbs with RW and stairs with 1 rail & 1 crutch with prostheses with supervision. (Target Date  02/18/2016)   Baseline MET with RW 02/21/2016   Time 4   Status Achieved           PT Long Term Goals - 01/20/16 1400    PT LONG TERM GOAL #1   Title Patient verbalizes & demonstrates proper prosthetic care to enable safe use of prostheses. (Target Date: 03/17/2016)   Time 12   Period Weeks   Status On-going   PT LONG TERM GOAL #2   Title Patient tolerates wear of bilateral prostheses >90% of awake hours without skin issues or limb tenderness to enable function throughout his day.  (Target Date: 03/17/2016)   Time 12   Period Weeks   Status On-going   PT LONG TERM GOAL #3   Title Patient able to perform standing balance activities with UE support reaching >10" all directions & to floor modified independent.  (Target Date: 03/17/2016)   Time 12   Period Weeks   Status On-going   PT LONG TERM GOAL #4   Title Patient ambulates 300' with LRAD & bilateral prostheses modified independent to enable community mobility.  (Target Date: 03/17/2016)   Time 12   Period Weeks   Status On-going   PT LONG TERM GOAL #5   Title Patient negotiates ramps, curbs & stairs with LRAD & bilateral prostheses modified independent to enable community mobility.  (Target Date: 03/17/2016)   Time 12   Period Weeks   Status On-going   PT LONG TERM GOAL #6   Title Patient ambulates household around furniture with [facility] & bilateral  prostheses modified independent.  (Target Date: 03/17/2016)   Time 12   Period Weeks   Status On-going               Plan - 02/28/16 1343    Clinical Impression Statement Patient had improved balance with 4pt gait with forearm crutches including quicker gait velocity. Patient was able to ambulate on gravel & grass with assist and instruction.    Rehab Potential Good   PT Frequency 2x / week   PT Duration 12 weeks   PT Treatment/Interventions ADLs/Self Care Home Management;DME Instruction;Gait training;Stair training;Functional mobility training;Therapeutic activities;Therapeutic exercise;Balance training;Neuromuscular re-education;Patient/family education;Prosthetic Training   PT Next Visit Plan work towards LTGs, prosthetic gait & balance with forearm crutches including barriers, work on sit to/from stand   Consulted and Agree with Plan of Care Patient;Family member/caregiver   Family Member Consulted wife      Patient will benefit from skilled therapeutic intervention in order to improve the following deficits and impairments:  Abnormal gait, Decreased activity tolerance, Decreased balance, Decreased knowledge of precautions, Decreased knowledge of use of DME, Decreased mobility, Postural dysfunction, Prosthetic Dependency, Improper body mechanics  Visit Diagnosis: Other abnormalities of gait and mobility  Unsteadiness on feet  Muscle weakness (generalized)     Problem List Patient Active Problem List   Diagnosis Date Noted  . Visit for wound care 07/19/2015  . Status post below knee amputation of right lower extremity ([place]) 05/14/2015  . History of left above knee amputation ([place]) 05/14/2015  . Toe amputation status ([place]) 04/28/2015  . OSA (obstructive sleep apnea) 02/23/2015  . Type 2 diabetes mellitus with other circulatory complications ([place]) 82/50/5397  . HLD (hyperlipidemia) 08/04/2014  . CKD (chronic kidney disease) 06/28/2014  . TIA (transient ischemic  attack) 06/27/2014  . OBESITY, UNSPECIFIED 08/17/2010  . SYNCOPE 08/17/2010  . Hyperlipidemia 08/11/2010  . Essential hypertension 08/11/2010    Jennah Satchell PT, DPT 02/29/2016, 1:46 PM  [facility] [address] [place], [place], [address] Phone: [phone]   Fax:  [phone]  Name: [name] MRN: [id number] Date of Birth: 02/02/1951

## 2016-03-02 ENCOUNTER — Encounter: Payer: Self-pay | Admitting: Physical Therapy

## 2016-03-02 ENCOUNTER — Ambulatory Visit: Payer: BLUE CROSS/BLUE SHIELD | Admitting: Physical Therapy

## 2016-03-02 DIAGNOSIS — R2689 Other abnormalities of gait and mobility: Secondary | ICD-10-CM | POA: Diagnosis not present

## 2016-03-02 DIAGNOSIS — M6281 Muscle weakness (generalized): Secondary | ICD-10-CM

## 2016-03-02 DIAGNOSIS — R2681 Unsteadiness on feet: Secondary | ICD-10-CM

## 2016-03-02 NOTE — Therapy (Signed)
[facility] [address] [place], [place], [address] Phone: [phone]   Fax:  [phone]  Physical Therapy Treatment  Patient Details  Name: [name] MRN: [id number] Date of Birth: 1951/03/31 Referring Provider: Rodell Perna, MD  Encounter Date: 03/02/2016      PT End of Session - 03/02/16 1214    Visit Number 19   Number of Visits 24   Date for PT Re-Evaluation 03/17/16   Authorization Type BCBS   PT Start Time 1015   PT Stop Time 1100   PT Time Calculation (min) 45 min   Equipment Utilized During Treatment Gait belt   Activity Tolerance Patient tolerated treatment well   Behavior During Therapy [facility] for tasks assessed/performed      Past Medical History  Diagnosis Date  . Hypertension   . Hyperlipidemia   . Necrosis ([place])     #2 nail   . Myocardial infarction ([place])     "mild" heart attack  . Poor circulation of extremity ([place])   . Sleep apnea     uses cpap, getting a new one  . Type 2 Diabetes mellitus     Type 2  . Arthritis   . Pneumonia     as a child  . CKD (chronic kidney disease) 06/28/2014    Sees Dr Florene Glen  . Cerebrovascular disease     MRI shows Right carotid inferior cavernours narrowing 75% and  50-75% stenosis of Cavernous and supraclinoi right side    Past Surgical History  Procedure Laterality Date  . Above knee leg amputation Left 2004  . Cataract extraction w/phaco  09/05/2012    Procedure: CATARACT EXTRACTION PHACO AND INTRAOCULAR LENS PLACEMENT (IOC);  Surgeon: Tonny Branch, MD;  Location: [facility];  Service: Ophthalmology;  Laterality: Left;  CDE=5.45  . Cataract extraction w/phaco  10/03/2012    Procedure: CATARACT EXTRACTION PHACO AND INTRAOCULAR LENS PLACEMENT (IOC);  Surgeon: Tonny Branch, MD;  Location: [facility];  Service: Ophthalmology;  Laterality: Right;  CDE: 12.31  . Colonoscopy    . Amputation Right 04/28/2015    Procedure: AMPUTATION RAY, RIGHT 5TH TOE;  Surgeon: Marybelle Killings, MD;  Location: [facility];  Service: Orthopedics;  Laterality: Right;  . Amputation Right 05/10/2015    Procedure: Right Below Knee Amputation;  Surgeon: Marybelle Killings, MD;  Location: [facility];  Service: Orthopedics;  Laterality: Right;    There were no vitals filed for this visit.      Subjective Assessment - 03/02/16 1021    Subjective no issues. no falls.   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 No/denies                         [facility] Adult PT Treatment/Exercise - 03/02/16 1015    Transfers   Transfers Sit to Stand;Stand to Sit;Floor to Transfer   Sit to Stand 4: Min guard;4: Min assist;With upper extremity assist;With armrests;From chair/3-in-1  to forearm crutches from chairs with armrests   Sit to Stand Details Verbal cues for technique;Visual cues/gestures for sequencing;Tactile cues for weight shifting   Stand to Sit 5: Supervision;With upper extremity assist;With armrests;To chair/3-in-1  from forearm crutches    Stand  to Sit Details (indicate cue type and reason) Verbal cues for technique;Verbal cues for precautions/safety;Verbal cues for safe use of DME/AE   Ambulation/Gait   Ambulation/Gait Yes   Ambulation/Gait Assistance 5: Supervision   Ambulation/Gait Assistance Details cues on sequence, crutch placement and ambulating on grass & gravel, Ambulating 5' X 2 along counter top with single crutch with occassional touch on counter for balance.    Ambulation Distance (Feet) 150 Feet  150' X 2 inside and 150' outside   Assistive device Lofstrands;Prostheses;R Forearm Crutch   Gait Pattern Decreased stride length;Decreased hip/knee flexion - left;Trunk flexed;Step-through pattern   Ambulation  Surface Level;Indoor;Outdoor;Unlevel;Gravel;Grass;Paved   Stairs Yes   Stairs Assistance 5: Supervision   Stair Management Technique One rail Left;With crutches;Forwards;Step to pattern   Number of Stairs 4   Ramp 4: Min assist  forearm crutches & prostheses   Ramp Details (indicate cue type and reason) cues on sequence & wt shift   Curb 4: Min assist  forearm crutches & prostheses   Curb Details (indicate cue type and reason) cues on sequence & technique   Prosthetics   Current prosthetic wear tolerance (days/week)  daily   Current prosthetic wear tolerance (#hours/day)  wearing all awake hours drying q4 hrs    Residual limb condition  pt reports no issues                  PT Short Term Goals - 02/21/16 1328    PT SHORT TERM GOAL #1   Title Patient donnes bilateral prostheses correctly and verbalizes proper cleaning of prostheses. (Target Date: 01/24/2016)   Baseline MET 01/20/2016   Time 1   Period Months   Status Achieved   PT SHORT TERM GOAL #2   Title Patient tolerates wear of bilateral prostheses >8 hrs total per day without skin issues or limb tenderness. (Target Date: 01/24/2016)   Baseline MET 01/20/2016   Time 1   Period Months   Status Achieved   PT SHORT TERM GOAL #3   Title Sit to/from stand transfers from w/c to RW with bilateral prostheses with supervision. (Target Date: 01/24/2016)   Baseline MET 01/20/2016   Time 1   Period Months   Status Achieved   PT SHORT TERM GOAL #4   Title Patient reaches 7" anteriorly, laterally, across midline and to floor with RW support with bilateral prostheses with supervision. (Target Date: 01/24/2016) new Target Date 02/18/2016   Baseline MET 02/15/2016   Time 1   Period Months   Status Achieved   PT SHORT TERM GOAL #5   Title Patient ambulates 100' with RW & bilateral prostheses with minA. (Target Date: 01/24/2016)   Baseline MET 01/20/2016   Time 1   Period Months   Status Achieved   PT SHORT TERM GOAL #6   Title  Patient able to sit to/from stand chair without armrests using UEs to device with minA (Target Date 02/18/2016)   Baseline MET 02/21/2016 to RW   Time 4   Period Weeks   Status Achieved   PT SHORT TERM GOAL #7   Title Patient able to manage pants with RW support for toileting with supervision. (Target Date 02/18/2016)   Baseline MET 02/15/2016   Time 4   Period Weeks   Status Achieved   PT SHORT TERM GOAL #8   Title Patient ambulates 100' with crutches & prostheses with supervision. (Target Date 02/18/2016)   Baseline MET 02/15/2016   Status Achieved   PT SHORT TERM GOAL #  9   TITLE Patient negotiates ramps, curbs with RW and stairs with 1 rail & 1 crutch with prostheses with supervision. (Target Date 02/18/2016)   Baseline MET with RW 02/21/2016   Time 4   Status Achieved           PT Long Term Goals - 01/20/16 1400    PT LONG TERM GOAL #1   Title Patient verbalizes & demonstrates proper prosthetic care to enable safe use of prostheses. (Target Date: 03/17/2016)   Time 12   Period Weeks   Status On-going   PT LONG TERM GOAL #2   Title Patient tolerates wear of bilateral prostheses >90% of awake hours without skin issues or limb tenderness to enable function throughout his day.  (Target Date: 03/17/2016)   Time 12   Period Weeks   Status On-going   PT LONG TERM GOAL #3   Title Patient able to perform standing balance activities with UE support reaching >10" all directions & to floor modified independent.  (Target Date: 03/17/2016)   Time 12   Period Weeks   Status On-going   PT LONG TERM GOAL #4   Title Patient ambulates 300' with LRAD & bilateral prostheses modified independent to enable community mobility.  (Target Date: 03/17/2016)   Time 12   Period Weeks   Status On-going   PT LONG TERM GOAL #5   Title Patient negotiates ramps, curbs & stairs with LRAD & bilateral prostheses modified independent to enable community mobility.  (Target Date: 03/17/2016)   Time 12   Period Weeks    Status On-going   PT LONG TERM GOAL #6   Title Patient ambulates household around furniture with [facility] & bilateral prostheses modified independent.  (Target Date: 03/17/2016)   Time 12   Period Weeks   Status On-going               Plan - 03/02/16 1215    Clinical Impression Statement Patient improved sit to stand chairs with armrests with MinA for balance loss first attempt then contact assist remainder. Patient was able to progress to gait on gravel & grass with forearm crutches which his job can require.    Rehab Potential Good   PT Frequency 2x / week   PT Duration 12 weeks   PT Treatment/Interventions ADLs/Self Care Home Management;DME Instruction;Gait training;Stair training;Functional mobility training;Therapeutic activities;Therapeutic exercise;Balance training;Neuromuscular re-education;Patient/family education;Prosthetic Training   PT Next Visit Plan work towards LTGs, prosthetic gait & balance with forearm crutches including barriers, work on sit to/from stand   Consulted and Agree with Plan of Care Patient;Family member/caregiver   Family Member Consulted wife      Patient will benefit from skilled therapeutic intervention in order to improve the following deficits and impairments:  Abnormal gait, Decreased activity tolerance, Decreased balance, Decreased knowledge of precautions, Decreased knowledge of use of DME, Decreased mobility, Postural dysfunction, Prosthetic Dependency, Improper body mechanics  Visit Diagnosis: Other abnormalities of gait and mobility  Unsteadiness on feet  Muscle weakness (generalized)     Problem List Patient Active Problem List   Diagnosis Date Noted  . Visit for wound care 07/19/2015  . Status post below knee amputation of right lower extremity ([place]) 05/14/2015  . History of left above knee amputation ([place]) 05/14/2015  . Toe amputation status ([place]) 04/28/2015  . OSA (obstructive sleep apnea) 02/23/2015  . Type 2 diabetes  mellitus with other circulatory complications ([place]) 09/81/1914  . HLD (hyperlipidemia) 08/04/2014  . CKD (chronic kidney disease) 06/28/2014  .  TIA (transient ischemic attack) 06/27/2014  . OBESITY, UNSPECIFIED 08/17/2010  . SYNCOPE 08/17/2010  . Hyperlipidemia 08/11/2010  . Essential hypertension 08/11/2010    Jamey Reas PT, DPT 03/02/2016, 12:17 PM  [facility] [address] [place], [place], [address] Phone: [phone]   Fax:  [phone]  Name: [name] MRN: [id number] Date of Birth: [birth year]

## 2016-03-06 ENCOUNTER — Encounter: Payer: Self-pay | Admitting: Physical Therapy

## 2016-03-06 ENCOUNTER — Ambulatory Visit: Payer: BLUE CROSS/BLUE SHIELD | Admitting: Physical Therapy

## 2016-03-06 DIAGNOSIS — R2689 Other abnormalities of gait and mobility: Secondary | ICD-10-CM | POA: Diagnosis not present

## 2016-03-06 DIAGNOSIS — M6281 Muscle weakness (generalized): Secondary | ICD-10-CM

## 2016-03-06 DIAGNOSIS — R2681 Unsteadiness on feet: Secondary | ICD-10-CM

## 2016-03-06 NOTE — Therapy (Signed)
[facility] [address] [place], [place], [address] Phone: [phone]   Fax:  [phone]  Physical Therapy Treatment  Patient Details  Name: [name] MRN: [id number] Date of Birth: 04/12/1951 Referring Provider: Rodell Perna, MD  Encounter Date: 03/06/2016      PT End of Session - 03/06/16 1222    Visit Number 20   Number of Visits 24   Date for PT Re-Evaluation 03/17/16   Authorization Type BCBS   PT Start Time 0931   PT Stop Time 1014   PT Time Calculation (min) 43 min   Equipment Utilized During Treatment Gait belt   Activity Tolerance Patient tolerated treatment well   Behavior During Therapy [facility] for tasks assessed/performed      Past Medical History  Diagnosis Date  . Hypertension   . Hyperlipidemia   . Necrosis ([place])     #2 nail   . Myocardial infarction ([place])     "mild" heart attack  . Poor circulation of extremity ([place])   . Sleep apnea     uses cpap, getting a new one  . Type 2 Diabetes mellitus     Type 2  . Arthritis   . Pneumonia     as a child  . CKD (chronic kidney disease) 06/28/2014    Sees Dr Florene Glen  . Cerebrovascular disease     MRI shows Right carotid inferior cavernours narrowing 75% and  50-75% stenosis of Cavernous and supraclinoi right side    Past Surgical History  Procedure Laterality Date  . Above knee leg amputation Left 2004  . Cataract extraction w/phaco  09/05/2012    Procedure: CATARACT EXTRACTION PHACO AND INTRAOCULAR LENS PLACEMENT (IOC);  Surgeon: Tonny Branch, MD;  Location: [facility];  Service: Ophthalmology;  Laterality: Left;  CDE=5.45  . Cataract extraction w/phaco  10/03/2012    Procedure: CATARACT EXTRACTION PHACO AND INTRAOCULAR LENS PLACEMENT (IOC);  Surgeon: Tonny Branch, MD;  Location: [facility];  Service: Ophthalmology;  Laterality: Right;  CDE: 12.31  . Colonoscopy    . Amputation Right 04/28/2015    Procedure: AMPUTATION RAY, RIGHT 5TH TOE;  Surgeon: Marybelle Killings, MD;  Location: [facility];  Service: Orthopedics;  Laterality: Right;  . Amputation Right 05/10/2015    Procedure: Right Below Knee Amputation;  Surgeon: Marybelle Killings, MD;  Location: [place] [facility];  Service: Orthopedics;  Laterality: Right;    There were no vitals filed for this visit.      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 No/denies                         [facility] Adult PT Treatment/Exercise - 03/06/16 0930    Transfers   Transfers Sit to Stand;Stand to Sit;Floor to Transfer   Sit to Stand 4: Min guard;5: Supervision;With upper extremity assist;With armrests;From chair/3-in-1  to forearm crutches from chairs with armrests   Sit to Stand Details Verbal cues for technique;Visual cues/gestures for sequencing;Tactile cues for weight shifting   Sit to Stand Details (indicate cue type and  reason) verbal cues on technique if sits in church pew   Stand to Sit 5: Supervision;With upper extremity assist;With armrests;To chair/3-in-1  from forearm crutches    Stand to Sit Details (indicate cue type and reason) Verbal cues for technique;Verbal cues for precautions/safety;Verbal cues for safe use of DME/AE   Ambulation/Gait   Ambulation/Gait Yes   Ambulation/Gait Assistance 5: Supervision;4: Min assist  minA with single crutch along counter with occasional touch   Ambulation/Gait Assistance Details cues on 4 pt sequence, posture & step length  gait along counter top on left & single R crutch with minA   Ambulation Distance (Feet) 400 Feet  400' X 2   Assistive device  Lofstrands;Prostheses;R Forearm Crutch   Gait Pattern Decreased stride length;Decreased hip/knee flexion - left;Trunk flexed;Step-through pattern   Ambulation Surface Indoor;Level;Outdoor;Unlevel;Paved;Gravel;Grass   Stairs Yes   Stairs Assistance 5: Supervision   Stairs Assistance Details (indicate cue type and reason) cues on crutch sequence   Stair Management Technique One rail Left;With crutches;Forwards;Step to pattern   Number of Stairs 4   Ramp 4: Min assist  forearm crutches & prostheses   Ramp Details (indicate cue type and reason) cues on sequence, posture & wt shift   Curb 4: Min assist  forearm crutches & prostheses   Curb Details (indicate cue type and reason) cues on sequence & technique   Prosthetics   Current prosthetic wear tolerance (days/week)  daily   Current prosthetic wear tolerance (#hours/day)  wearing all awake hours drying prn   Residual limb condition  pt reports no issues                PT Education - 03/06/16 1221    Education provided Yes   Education Details increasing actiivity level    Person(s) Educated Patient;Spouse   Methods Explanation;Verbal cues   Comprehension Verbalized understanding          PT Short Term Goals - 02/21/16 1328    PT SHORT TERM GOAL #1   Title Patient donnes bilateral prostheses correctly and verbalizes proper cleaning of prostheses. (Target Date: 01/24/2016)   Baseline MET 01/20/2016   Time 1   Period Months   Status Achieved   PT SHORT TERM GOAL #2   Title Patient tolerates wear of bilateral prostheses >8 hrs total per day without skin issues or limb tenderness. (Target Date: 01/24/2016)   Baseline MET 01/20/2016   Time 1   Period Months   Status Achieved   PT SHORT TERM GOAL #3   Title Sit to/from stand transfers from w/c to RW with bilateral prostheses with supervision. (Target Date: 01/24/2016)   Baseline MET 01/20/2016   Time 1   Period Months   Status Achieved   PT SHORT TERM GOAL #4   Title Patient  reaches 7" anteriorly, laterally, across midline and to floor with RW support with bilateral prostheses with supervision. (Target Date: 01/24/2016) new Target Date 02/18/2016   Baseline MET 02/15/2016   Time 1   Period Months   Status Achieved   PT SHORT TERM GOAL #5   Title Patient ambulates 100' with RW & bilateral prostheses with minA. (Target Date: 01/24/2016)   Baseline MET 01/20/2016   Time 1   Period Months   Status Achieved   PT SHORT TERM GOAL #6   Title Patient able to sit to/from stand chair without armrests using UEs to device with minA (Target Date 02/18/2016)   Baseline MET 02/21/2016 to RW   Time 4  Period Weeks   Status Achieved   PT SHORT TERM GOAL #7   Title Patient able to manage pants with RW support for toileting with supervision. (Target Date 02/18/2016)   Baseline MET 02/15/2016   Time 4   Period Weeks   Status Achieved   PT SHORT TERM GOAL #8   Title Patient ambulates 100' with crutches & prostheses with supervision. (Target Date 02/18/2016)   Baseline MET 02/15/2016   Status Achieved   PT SHORT TERM GOAL #9   TITLE Patient negotiates ramps, curbs with RW and stairs with 1 rail & 1 crutch with prostheses with supervision. (Target Date 02/18/2016)   Baseline MET with RW 02/21/2016   Time 4   Status Achieved           PT Long Term Goals - 01/20/16 1400    PT LONG TERM GOAL #1   Title Patient verbalizes & demonstrates proper prosthetic care to enable safe use of prostheses. (Target Date: 03/17/2016)   Time 12   Period Weeks   Status On-going   PT LONG TERM GOAL #2   Title Patient tolerates wear of bilateral prostheses >90% of awake hours without skin issues or limb tenderness to enable function throughout his day.  (Target Date: 03/17/2016)   Time 12   Period Weeks   Status On-going   PT LONG TERM GOAL #3   Title Patient able to perform standing balance activities with UE support reaching >10" all directions & to floor modified independent.  (Target Date:  03/17/2016)   Time 12   Period Weeks   Status On-going   PT LONG TERM GOAL #4   Title Patient ambulates 300' with LRAD & bilateral prostheses modified independent to enable community mobility.  (Target Date: 03/17/2016)   Time 12   Period Weeks   Status On-going   PT LONG TERM GOAL #5   Title Patient negotiates ramps, curbs & stairs with LRAD & bilateral prostheses modified independent to enable community mobility.  (Target Date: 03/17/2016)   Time 12   Period Weeks   Status On-going   PT LONG TERM GOAL #6   Title Patient ambulates household around furniture with [facility] & bilateral prostheses modified independent.  (Target Date: 03/17/2016)   Time 12   Period Weeks   Status On-going               Plan - 03/06/16 1404    Clinical Impression Statement Patient improved gait with forearm crutches including outside on grass. He is on target to meet LTGs next week but family not sure they are confident yet.    Rehab Potential Good   PT Frequency 2x / week   PT Duration 12 weeks   PT Treatment/Interventions ADLs/Self Care Home Management;DME Instruction;Gait training;Stair training;Functional mobility training;Therapeutic activities;Therapeutic exercise;Balance training;Neuromuscular re-education;Patient/family education;Prosthetic Training   PT Next Visit Plan work towards LTGs, prosthetic gait & balance with forearm crutches including barriers, work on sit to/from stand   Consulted and Agree with Plan of Care Patient;Family member/caregiver   Family Member Consulted wife      Patient will benefit from skilled therapeutic intervention in order to improve the following deficits and impairments:  Abnormal gait, Decreased activity tolerance, Decreased balance, Decreased knowledge of precautions, Decreased knowledge of use of DME, Decreased mobility, Postural dysfunction, Prosthetic Dependency, Improper body mechanics  Visit Diagnosis: Other abnormalities of gait and  mobility  Unsteadiness on feet  Muscle weakness (generalized)     Problem List Patient Active Problem  List   Diagnosis Date Noted  . Visit for wound care 07/19/2015  . Status post below knee amputation of right lower extremity ([place]) 05/14/2015  . History of left above knee amputation ([place]) 05/14/2015  . Toe amputation status ([place]) 04/28/2015  . OSA (obstructive sleep apnea) 02/23/2015  . Type 2 diabetes mellitus with other circulatory complications ([place]) 08/19/1172  . HLD (hyperlipidemia) 08/04/2014  . CKD (chronic kidney disease) 06/28/2014  . TIA (transient ischemic attack) 06/27/2014  . OBESITY, UNSPECIFIED 08/17/2010  . SYNCOPE 08/17/2010  . Hyperlipidemia 08/11/2010  . Essential hypertension 08/11/2010    Jamey Reas PT, DPT 03/06/2016, 2:05 PM  [facility] [address] [place], [place], [address] Phone: [phone]   Fax:  [phone]  Name: [name] MRN: [id number] Date of Birth: August 16, 1951

## 2016-03-09 ENCOUNTER — Ambulatory Visit: Payer: BLUE CROSS/BLUE SHIELD | Admitting: Physical Therapy

## 2016-03-09 ENCOUNTER — Encounter: Payer: Self-pay | Admitting: Physical Therapy

## 2016-03-09 DIAGNOSIS — R2681 Unsteadiness on feet: Secondary | ICD-10-CM

## 2016-03-09 DIAGNOSIS — R2689 Other abnormalities of gait and mobility: Secondary | ICD-10-CM | POA: Diagnosis not present

## 2016-03-09 DIAGNOSIS — M6281 Muscle weakness (generalized): Secondary | ICD-10-CM

## 2016-03-10 NOTE — Therapy (Signed)
[facility] [address] [place], [place], [address] Phone: [phone]   Fax:  [phone]  Physical Therapy Treatment  Patient Details  Name: [name] MRN: [id number] Date of Birth: Jun 24, 1951 Referring Provider: Rodell Perna, MD  Encounter Date: 03/09/2016      PT End of Session - 03/09/16 1100    Visit Number 21   Number of Visits 24   Date for PT Re-Evaluation 03/17/16   Authorization Type BCBS   PT Start Time 1016   PT Stop Time 1100   PT Time Calculation (min) 44 min   Equipment Utilized During Treatment Gait belt   Activity Tolerance Patient tolerated treatment well   Behavior During Therapy [facility] for tasks assessed/performed      Past Medical History  Diagnosis Date  . Hypertension   . Hyperlipidemia   . Necrosis ([place])     #2 nail   . Myocardial infarction ([place])     "mild" heart attack  . Poor circulation of extremity ([place])   . Sleep apnea     uses cpap, getting a new one  . Type 2 Diabetes mellitus     Type 2  . Arthritis   . Pneumonia     as a child  . CKD (chronic kidney disease) 06/28/2014    Sees Dr Florene Glen  . Cerebrovascular disease     MRI shows Right carotid inferior cavernours narrowing 75% and  50-75% stenosis of Cavernous and supraclinoi right side    Past Surgical History  Procedure Laterality Date  . Above knee leg amputation Left 2004  . Cataract extraction w/phaco  09/05/2012    Procedure: CATARACT EXTRACTION PHACO AND INTRAOCULAR LENS PLACEMENT (IOC);  Surgeon: Tonny Branch, MD;  [facility]: [facility];  Service: Ophthalmology;  Laterality: Left;  CDE=5.45  . Cataract extraction w/phaco  10/03/2012    Procedure: CATARACT EXTRACTION PHACO AND INTRAOCULAR LENS PLACEMENT (IOC);  Surgeon: Tonny Branch, MD;  Location[facility];  Service: Ophthalmology;  Laterality: Right;  CDE: 12.31  . Colonoscopy    . Amputation Right 04/28/2015    Procedure: AMPUTATION RAY, RIGHT 5TH TOE;  Surgeon: Marybelle Killings, MD;  Location: [place];  Service: Orthopedics;  Laterality: Right;  . Amputation Right 05/10/2015    Procedure: Right Below Knee Amputation;  Surgeon: Marybelle Killings, MD;  Location: [place];  Service: Orthopedics;  Laterality: Right;    There were no vitals filed for this visit.      Subjective Assessment - 03/09/16 1015    Subjective Patient reports he used crutches one time for short distance & plans to try more this weekend.   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 No/denies                         [facility] Adult PT Treatment/Exercise - 03/09/16 1015    Transfers   Transfers Sit to Stand;Stand to Sit;Floor to Transfer   Sit to Stand 4: Min guard;5: Supervision;With upper extremity assist;With armrests;From chair/3-in-1  to forearm crutches from chairs with armrests   Sit to Stand Details Verbal cues for technique;Visual cues/gestures for sequencing;Tactile cues for weight shifting   Sit to Stand Details (indicate cue  type and reason) sit to stand from chairs without armrests to RW & chairs with armrests to forearm crutches   Stand to Sit 5: Supervision;With upper extremity assist;With armrests;To chair/3-in-1  from forearm crutches    Stand to Sit Details (indicate cue type and reason) Verbal cues for technique;Verbal cues for precautions/safety;Verbal cues for safe use of DME/AE   Stand to Sit Details stand to sit in chairs without armrests from [place] chairs with armrests from forearm crutches   Ambulation/Gait   Ambulation/Gait Yes   Ambulation/Gait Assistance 5: Supervision;4: Min assist  minA with single crutch along counter with occasional touch   Ambulation/Gait Assistance Details cues on  sequence & posture with crutches, worked on scanning while maintaining path.    Ambulation Distance (Feet) 400 Feet  400' X 2, 20' with single crutch along counter   Assistive device Lofstrands;Prostheses;R Forearm Crutch   Gait Pattern Decreased stride length;Decreased hip/knee flexion - left;Trunk flexed;Step-through pattern   Stairs Yes   Stairs Assistance 5: Supervision   Stairs Assistance Details (indicate cue type and reason) cues on technique   Stair Management Technique One rail Left;With crutches;Forwards;Step to pattern   Number of Stairs 4   Ramp 4: Min assist  forearm crutches & prostheses   Ramp Details (indicate cue type and reason) cues on technique   Curb 4: Min assist  forearm crutches & prostheses   Curb Details (indicate cue type and reason) cues on technique   Prosthetics   Current prosthetic wear tolerance (days/week)  daily   Current prosthetic wear tolerance (#hours/day)  wearing all awake hours drying prn   Residual limb condition  pt reports no issues                  PT Short Term Goals - 02/21/16 1328    PT SHORT TERM GOAL #1   Title Patient donnes bilateral prostheses correctly and verbalizes proper cleaning of prostheses. (Target Date: 01/24/2016)   Baseline MET 01/20/2016   Time 1   Period Months   Status Achieved   PT SHORT TERM GOAL #2   Title Patient tolerates wear of bilateral prostheses >8 hrs total per day without skin issues or limb tenderness. (Target Date: 01/24/2016)   Baseline MET 01/20/2016   Time 1   Period Months   Status Achieved   PT SHORT TERM GOAL #3   Title Sit to/from stand transfers from w/c to RW with bilateral prostheses with supervision. (Target Date: 01/24/2016)   Baseline MET 01/20/2016   Time 1   Period Months   Status Achieved   PT SHORT TERM GOAL #4   Title Patient reaches 7" anteriorly, laterally, across midline and to floor with RW support with bilateral prostheses with supervision. (Target Date: 01/24/2016)  new Target Date 02/18/2016   Baseline MET 02/15/2016   Time 1   Period Months   Status Achieved   PT SHORT TERM GOAL #5   Title Patient ambulates 100' with RW & bilateral prostheses with minA. (Target Date: 01/24/2016)   Baseline MET 01/20/2016   Time 1   Period Months   Status Achieved   PT SHORT TERM GOAL #6   Title Patient able to sit to/from stand chair without armrests using UEs to device with minA (Target Date 02/18/2016)   Baseline MET 02/21/2016 to RW   Time 4   Period Weeks   Status Achieved   PT SHORT TERM GOAL #7   Title Patient able to manage pants with RW support  for toileting with supervision. (Target Date 02/18/2016)   Baseline MET 02/15/2016   Time 4   Period Weeks   Status Achieved   PT SHORT TERM GOAL #8   Title Patient ambulates 100' with crutches & prostheses with supervision. (Target Date 02/18/2016)   Baseline MET 02/15/2016   Status Achieved   PT SHORT TERM GOAL #9   TITLE Patient negotiates ramps, curbs with RW and stairs with 1 rail & 1 crutch with prostheses with supervision. (Target Date 02/18/2016)   Baseline MET with RW 02/21/2016   Time 4   Status Achieved           PT Long Term Goals - 01/20/16 1400    PT LONG TERM GOAL #1   Title Patient verbalizes & demonstrates proper prosthetic care to enable safe use of prostheses. (Target Date: 03/17/2016)   Time 12   Period Weeks   Status On-going   PT LONG TERM GOAL #2   Title Patient tolerates wear of bilateral prostheses >90% of awake hours without skin issues or limb tenderness to enable function throughout his day.  (Target Date: 03/17/2016)   Time 12   Period Weeks   Status On-going   PT LONG TERM GOAL #3   Title Patient able to perform standing balance activities with UE support reaching >10" all directions & to floor modified independent.  (Target Date: 03/17/2016)   Time 12   Period Weeks   Status On-going   PT LONG TERM GOAL #4   Title Patient ambulates 300' with LRAD & bilateral prostheses  modified independent to enable community mobility.  (Target Date: 03/17/2016)   Time 12   Period Weeks   Status On-going   PT LONG TERM GOAL #5   Title Patient negotiates ramps, curbs & stairs with LRAD & bilateral prostheses modified independent to enable community mobility.  (Target Date: 03/17/2016)   Time 12   Period Weeks   Status On-going   PT LONG TERM GOAL #6   Title Patient ambulates household around furniture with [facility] & bilateral prostheses modified independent.  (Target Date: 03/17/2016)   Time 12   Period Weeks   Status On-going               Plan - 03/09/16 1100    Clinical Impression Statement PT, patient and wife reviewed LTGs with target date for next week with overall focus to ambulate with RW in home or church office and crutches in community including barriers. He is progressing towards LTGs but will not fully meet them by next week. He still has potential to achieve this level of function with additional skilled care. After discussion, plan is to renew next week with reduced frequency of 1x/wk.    Rehab Potential Good   PT Frequency 2x / week   PT Duration 12 weeks   PT Treatment/Interventions ADLs/Self Care Home Management;DME Instruction;Gait training;Stair training;Functional mobility training;Therapeutic activities;Therapeutic exercise;Balance training;Neuromuscular re-education;Patient/family education;Prosthetic Training   PT Next Visit Plan assess LTGs & renew at 1x/wk, prosthetic gait & balance with forearm crutches including barriers, work on sit to/from stand   Consulted and Agree with Plan of Care Patient;Family member/caregiver   Family Member Consulted wife      Patient will benefit from skilled therapeutic intervention in order to improve the following deficits and impairments:  Abnormal gait, Decreased activity tolerance, Decreased balance, Decreased knowledge of precautions, Decreased knowledge of use of DME, Decreased mobility, Postural  dysfunction, Prosthetic Dependency, Improper body mechanics  Visit Diagnosis:  Other abnormalities of gait and mobility  Unsteadiness on feet  Muscle weakness (generalized)     Problem List Patient Active Problem List   Diagnosis Date Noted  . Visit for wound care 07/19/2015  . Status post below knee amputation of right lower extremity ([place]) 05/14/2015  . History of left above knee amputation ([place]) 05/14/2015  . Toe amputation status ([place]) 04/28/2015  . OSA (obstructive sleep apnea) 02/23/2015  . Type 2 diabetes mellitus with other circulatory complications ([place]) 10/14/2445  . HLD (hyperlipidemia) 08/04/2014  . CKD (chronic kidney disease) 06/28/2014  . TIA (transient ischemic attack) 06/27/2014  . OBESITY, UNSPECIFIED 08/17/2010  . SYNCOPE 08/17/2010  . Hyperlipidemia 08/11/2010  . Essential hypertension 08/11/2010    Jamey Reas PT, DPT 03/10/2016, 10:30 AM  [facility] [address] [place], [place], [address] Phone: [phone]   Fax:  [phone]  Name: [name] MRN: [id number] Date of Birth: Jun 21, 1951

## 2016-03-14 ENCOUNTER — Encounter: Payer: Self-pay | Admitting: Physical Therapy

## 2016-03-14 ENCOUNTER — Ambulatory Visit: Payer: BLUE CROSS/BLUE SHIELD | Admitting: Physical Therapy

## 2016-03-14 DIAGNOSIS — R2689 Other abnormalities of gait and mobility: Secondary | ICD-10-CM

## 2016-03-14 DIAGNOSIS — M6281 Muscle weakness (generalized): Secondary | ICD-10-CM

## 2016-03-14 DIAGNOSIS — R2681 Unsteadiness on feet: Secondary | ICD-10-CM

## 2016-03-15 NOTE — Therapy (Signed)
[facility] [address] [place], [place], [address] Phone: [phone]   Fax:  [phone]  Physical Therapy Treatment  Patient Details  Name: [name] MRN: [id number] Date of Birth: [birth year] Referring Provider: Rodell Perna, MD  Encounter Date: 03/14/2016      PT End of Session - 03/14/16 1405    Visit Number 22   Number of Visits 27   Date for PT Re-Evaluation 04/18/16   Authorization Type BCBS   PT Start Time 1015   PT Stop Time 1059   PT Time Calculation (min) 44 min   Equipment Utilized During Treatment Gait belt   Activity Tolerance Patient tolerated treatment well   Behavior During Therapy [facility] for tasks assessed/performed      Past Medical History  Diagnosis Date  . Hypertension   . Hyperlipidemia   . Necrosis ([place])     #2 nail   . Myocardial infarction ([place])     "mild" heart attack  . Poor circulation of extremity ([place])   . Sleep apnea     uses cpap, getting a new one  . Type 2 Diabetes mellitus     Type 2  . Arthritis   . Pneumonia     as a child  . CKD (chronic kidney disease) 06/28/2014    Sees Dr Florene Glen  . Cerebrovascular disease     MRI shows Right carotid inferior cavernours narrowing 75% and  50-75% stenosis of Cavernous and supraclinoi right side    Past Surgical History  Procedure Laterality Date  . Above knee leg amputation Left 2004  . Cataract extraction w/phaco  09/05/2012    Procedure: CATARACT EXTRACTION PHACO AND INTRAOCULAR LENS PLACEMENT (IOC);  Surgeon: Tonny Branch, MD;  [facility];  Service: Ophthalmology;  Laterality: Left;  CDE=5.45  . Cataract extraction w/phaco  10/03/2012    Procedure: CATARACT EXTRACTION PHACO AND INTRAOCULAR LENS PLACEMENT (IOC);  Surgeon: Tonny Branch, MD;  Location: [facility];  Service: Ophthalmology;  Laterality: Right;  CDE: 12.31  . Colonoscopy    . Amputation Right 04/28/2015    Procedure: AMPUTATION RAY, RIGHT 5TH TOE;  Surgeon: Marybelle Killings, MD;  Location: [facility];  Service: Orthopedics;  Laterality: Right;  . Amputation Right 05/10/2015    Procedure: Right Below Knee Amputation;  Surgeon: Marybelle Killings, MD;  Location: [facility];  Service: Orthopedics;  Laterality: Right;    There were no vitals filed for this visit.      Subjective Assessment - 03/14/16 1015    Subjective (p) Patient reports he used crutches to get into church on Sunday.    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 (p) No/denies             Prosthetics Assessment - 03/14/16 [place] with Skin check;Residual limb care;Prosthetic cleaning;Correct ply sock adjustment;Proper wear schedule/adjustment;Ply sock cleaning                    [facility] Adult PT Treatment/Exercise - 03/14/16 1015    Transfers   Transfers Sit to Stand;Stand to Sit;Floor to Transfer   Sit to Stand 4: Min guard;5: Supervision;With upper extremity assist;With  armrests;From chair/3-in-1  from chairs with armrests SBA, without armrests min guard   Sit to Stand Details Verbal cues for technique;Visual cues/gestures for sequencing;Tactile cues for weight shifting   Stand to Sit 5: Supervision;With upper extremity assist;With armrests;To chair/3-in-1  from forearm crutches    Stand to Sit Details (indicate cue type and reason) Verbal cues for technique;Verbal cues for precautions/safety;Verbal cues for safe use of DME/AE   Ambulation/Gait   Ambulation/Gait Yes   Ambulation/Gait Assistance 5: Supervision   Ambulation/Gait Assistance Details cues on posture & step length   Ambulation Distance (Feet) 400 Feet  400' X 2, 20' with single crutch along counter    Assistive device Lofstrands;Prostheses   Gait Pattern Decreased stride length;Decreased hip/knee flexion - left;Trunk flexed;Step-through pattern   Ambulation Surface Indoor;Level   Stairs Yes   Stairs Assistance 5: Supervision   Stairs Assistance Details (indicate cue type and reason) cues on clearing TFA prosthesis & prosthetic knee control   Stair Management Technique One rail Left;With crutches;Forwards;Step to pattern   Number of Stairs 4   Ramp 4: Min assist  forearm crutches & prostheses   Ramp Details (indicate cue type and reason) cues on posture & step length   Curb 4: Min assist  forearm crutches & prostheses   Curb Details (indicate cue type and reason) cues on sequence & weight shift   Balance   Balance Assessed Yes   Static Standing Balance   Static Standing - Balance Support No upper extremity supported  intermittent UE support but working on no UE support   Static Standing - Level of Assistance 5: Stand by assistance  in parallel bars with support intermittently for recovery   Static Standing - Comment/# of Minutes multiple attempts longest 11 sec   Dynamic Standing Balance   Dynamic Standing - Balance Support Left upper extremity supported;Right upper extremity supported;During functional activity  reaching with support of parallel bar   Dynamic Standing - Level of Assistance 5: Stand by assistance   Reaching for objects comments: reaches 5" anteriorly & laterally, reaches to floor    Reaching across midline comments: reaches 5" with supervision   Head turns comments: MinA with vertical UE support at shoulder ht   Head nods comments: MinA with vertical UE support at shoulder ht   Prosthetics   Current prosthetic wear tolerance (days/week)  daily   Current prosthetic wear tolerance (#hours/day)  wearing all awake hours drying prn   Current prosthetic weight-bearing tolerance (hours/day)  no pain or discomfort with standing >15 minutes   Residual limb condition  pt  reports no issues                  PT Short Term Goals - 02/21/16 1328    PT SHORT TERM GOAL #1   Title Patient donnes bilateral prostheses correctly and verbalizes proper cleaning of prostheses. (Target Date: 01/24/2016)   Baseline MET 01/20/2016   Time 1   Period Months   Status Achieved   PT SHORT TERM GOAL #2   Title Patient tolerates wear of bilateral prostheses >8 hrs total per day without skin issues or limb tenderness. (Target Date: 01/24/2016)   Baseline MET 01/20/2016   Time 1   Period Months   Status Achieved   PT SHORT TERM GOAL #3   Title Sit to/from stand transfers from w/c to RW with bilateral prostheses with supervision. (Target Date: 01/24/2016)   Baseline MET 01/20/2016   Time 1   Period Months   Status  Achieved   PT SHORT TERM GOAL #4   Title Patient reaches 7" anteriorly, laterally, across midline and to floor with RW support with bilateral prostheses with supervision. (Target Date: 01/24/2016) new Target Date 02/18/2016   Baseline MET 02/15/2016   Time 1   Period Months   Status Achieved   PT SHORT TERM GOAL #5   Title Patient ambulates 100' with RW & bilateral prostheses with minA. (Target Date: 01/24/2016)   Baseline MET 01/20/2016   Time 1   Period Months   Status Achieved   PT SHORT TERM GOAL #6   Title Patient able to sit to/from stand chair without armrests using UEs to device with minA (Target Date 02/18/2016)   Baseline MET 02/21/2016 to RW   Time 4   Period Weeks   Status Achieved   PT SHORT TERM GOAL #7   Title Patient able to manage pants with RW support for toileting with supervision. (Target Date 02/18/2016)   Baseline MET 02/15/2016   Time 4   Period Weeks   Status Achieved   PT SHORT TERM GOAL #8   Title Patient ambulates 100' with crutches & prostheses with supervision. (Target Date 02/18/2016)   Baseline MET 02/15/2016   Status Achieved   PT SHORT TERM GOAL #9   TITLE Patient negotiates ramps, curbs with RW and stairs with 1 rail & 1  crutch with prostheses with supervision. (Target Date 02/18/2016)   Baseline MET with RW 02/21/2016   Time 4   Status Achieved           PT Long Term Goals - 03/14/16 1331    PT LONG TERM GOAL #1   Title Patient verbalizes & demonstrates proper prosthetic care to enable safe use of prostheses. (Target Date: 03/17/2016)   Baseline MET 03/14/2016   Time 12   Period Weeks   Status Achieved   PT LONG TERM GOAL #2   Title Patient tolerates wear of bilateral prostheses >90% of awake hours without skin issues or limb tenderness to enable function throughout his day.  (Target Date: 03/17/2016)   Baseline MET 03/14/2016   Time 12   Period Weeks   Status Achieved   PT LONG TERM GOAL #3   Title Patient able to perform standing balance activities with UE support reaching >10" all directions & to floor modified independent.  (Target Date: 03/17/2016) NEW Target Date 04/18/2016   Baseline Progressing 03/14/2016 but not met. Patient reaches 5" anteriorly, laterally & to floor with one hand support on sturdy object like parallel bar with supervision.    Time 1   Period Months   Status On-going   PT LONG TERM GOAL #4   Title Patient ambulates 300' including grass surfaces with LRAD & bilateral prostheses modified independent to enable community mobility.  (Target Date: 03/17/2016) NEW Target Date 04/18/2016   Baseline Progressing but not met 03/14/2016. Patient ambulates 300' with RW or forearm crutches with bilateral prostheses with close supervision. On grass he needs minA.    Time 1   Period Months   Status On-going   PT LONG TERM GOAL #5   Title Patient negotiates ramps, curbs & stairs with LRAD & bilateral prostheses modified independent to enable community mobility.  (Target Date: 03/17/2016) NEW Target Date 04/18/2016   Baseline progressing 03/14/2016 but not met. Patient negotiates stairs with 1 rail & 1 forearm crutch with supervision and ramps / curbs with forearm crutches with minA or RW with  supervision.    Time  1   Period Months   Status On-going   PT LONG TERM GOAL #6   Title Patient ambulates household around furniture with [facility] & bilateral prostheses modified independent.  (Target Date: 03/17/2016) NEW Target Date 04/18/2016   Baseline Partially MET with RW 03/14/2016   Time 12   Period Weeks   Status Partially Met               Plan - 03/14/16 1407    Clinical Impression Statement Patient has met LTGs addressing prosthetic care & wear. He progressed towards LTGs addressing balance and gait but did not fully meet them or his potential to maximize mobility in community. He has potential to meet LTGs to enable function at community level with bilateral prostheses (one TFA & one TTA) with additional skilled care.    Rehab Potential Good   PT Frequency 1x / week   PT Duration 4 weeks   PT Treatment/Interventions ADLs/Self Care Home Management;DME Instruction;Gait training;Stair training;Functional mobility training;Therapeutic activities;Therapeutic exercise;Balance training;Neuromuscular re-education;Patient/family education;Prosthetic Training   PT Next Visit Plan prosthetic gait & balance with forearm crutches including barriers, work on sit to/from stand   Consulted and Agree with Plan of Care Patient   Family Member Consulted --      Patient will benefit from skilled therapeutic intervention in order to improve the following deficits and impairments:  Abnormal gait, Decreased activity tolerance, Decreased balance, Decreased knowledge of precautions, Decreased knowledge of use of DME, Decreased mobility, Postural dysfunction, Prosthetic Dependency, Improper body mechanics  Visit Diagnosis: Other abnormalities of gait and mobility  Unsteadiness on feet  Muscle weakness (generalized)     Problem List Patient Active Problem List   Diagnosis Date Noted  . Visit for wound care 07/19/2015  . Status post below knee amputation of right lower extremity ([place])  05/14/2015  . History of left above knee amputation ([place]) 05/14/2015  . Toe amputation status ([place]) 04/28/2015  . OSA (obstructive sleep apnea) 02/23/2015  . Type 2 diabetes mellitus with other circulatory complications ([place]) 67/34/1937  . HLD (hyperlipidemia) 08/04/2014  . CKD (chronic kidney disease) 06/28/2014  . TIA (transient ischemic attack) 06/27/2014  . OBESITY, UNSPECIFIED 08/17/2010  . SYNCOPE 08/17/2010  . Hyperlipidemia 08/11/2010  . Essential hypertension 08/11/2010    Jamey Reas PT, DPT 03/15/2016, 8:06 AM  [facility] [address] [place], [place], [address] Phone: [phone]   Fax:  [phone]  Name: [name] MRN: [id number] Date of Birth: April 09, 1951

## 2016-03-16 ENCOUNTER — Encounter: Payer: Self-pay | Admitting: Physical Therapy

## 2016-03-16 ENCOUNTER — Ambulatory Visit: Payer: BLUE CROSS/BLUE SHIELD | Admitting: Physical Therapy

## 2016-03-16 DIAGNOSIS — R2689 Other abnormalities of gait and mobility: Secondary | ICD-10-CM | POA: Diagnosis not present

## 2016-03-16 DIAGNOSIS — M6281 Muscle weakness (generalized): Secondary | ICD-10-CM

## 2016-03-16 DIAGNOSIS — R2681 Unsteadiness on feet: Secondary | ICD-10-CM

## 2016-03-17 NOTE — Therapy (Signed)
[facility] [address] [place], [place], [address] Phone: [phone]   Fax:  [phone]  Physical Therapy Treatment  Patient Details  Name: [name] MRN: [id number] Date of Birth: April 17, 1951 Referring Provider: Rodell Perna, MD  Encounter Date: 03/16/2016      PT End of Session - 03/16/16 1837    Visit Number 23   Number of Visits 27   Date for PT Re-Evaluation 04/18/16   Authorization Type BCBS   PT Start Time 1017   PT Stop Time 1100   PT Time Calculation (min) 43 min   Equipment Utilized During Treatment Gait belt   Activity Tolerance Patient tolerated treatment well   Behavior During Therapy [facility] for tasks assessed/performed      Past Medical History  Diagnosis Date  . Hypertension   . Hyperlipidemia   . Necrosis ([place])     #2 nail   . Myocardial infarction ([place])     "mild" heart attack  . Poor circulation of extremity ([place])   . Sleep apnea     uses cpap, getting a new one  . Type 2 Diabetes mellitus     Type 2  . Arthritis   . Pneumonia     as a child  . CKD (chronic kidney disease) 06/28/2014    Sees Dr Florene Glen  . Cerebrovascular disease     MRI shows Right carotid inferior cavernours narrowing 75% and  50-75% stenosis of Cavernous and supraclinoi right side    Past Surgical History  Procedure Laterality Date  . Above knee leg amputation Left 2004  . Cataract extraction w/phaco  09/05/2012    Procedure: CATARACT EXTRACTION PHACO AND INTRAOCULAR LENS PLACEMENT (IOC);  Surgeon: Tonny Branch, MD;  Location: [facility];  Service: Ophthalmology;  Laterality: Left;  CDE=5.45  . Cataract extraction w/phaco  10/03/2012    Procedure: CATARACT EXTRACTION PHACO AND INTRAOCULAR LENS PLACEMENT (IOC);  Surgeon: Tonny Branch, MD;  Location: [facility];  Service: Ophthalmology;  Laterality: Right;  CDE: 12.31  . Colonoscopy    . Amputation Right 04/28/2015    Procedure: AMPUTATION RAY, RIGHT 5TH TOE;  Surgeon: Marybelle Killings, MD;  Location: [facility];  Service: Orthopedics;  Laterality: Right;  . Amputation Right 05/10/2015    Procedure: Right Below Knee Amputation;  Surgeon: Marybelle Killings, MD;  Location: [facility];  Service: Orthopedics;  Laterality: Right;    There were no vitals filed for this visit.     03/16/16 1015  Transfers  Transfers Sit to Stand;Stand to Sit;Floor to Transfer  Sit to Stand 4: Min guard;5: Supervision;With upper extremity assist;With armrests;From chair/3-in-1 (from chairs with armrests SBA, without armrests min guard)  Sit to Stand Details Verbal cues for technique;Visual cues/gestures for sequencing;Tactile cues for weight shifting  Sit to Stand Details (indicate cue type and reason) demo & verbal cues on technique with discussion on potential why he feel standing up from car seat  Stand to Sit 5: Supervision;With upper extremity assist;With armrests;To chair/3-in-1 (from forearm crutches )  Stand to Sit Details (indicate cue type and reason) Verbal cues for technique;Verbal cues for precautions/safety;Verbal cues for safe use of DME/AE  Ambulation/Gait  Ambulation/Gait Yes  Ambulation/Gait Assistance 5: Supervision  Ambulation/Gait Assistance Details cues on posture & step length  Ambulation Distance (Feet) 400 Feet (400' X 2, 20' X 2 with single crutch along counter)  Assistive device Lofstrands;Prostheses;L Axillary Crutch (single crutch near counter with intermittent touching)  Gait Pattern Decreased  stride length;Decreased hip/knee flexion - left;Trunk flexed;Step-through pattern  Ambulation Surface Indoor;Level  Stairs Yes  Stairs Assistance 5: Supervision  Stair Management Technique One rail Left;With crutches;Forwards;Step to pattern  Number of Stairs 4  Ramp 4: Min assist (forearm crutches & prostheses)  Curb 4: Min assist (forearm crutches & prostheses)  Prosthetics  Current prosthetic wear tolerance (days/week)  daily  Current prosthetic wear tolerance (#hours/day)   wearing all awake hours drying prn  Current prosthetic weight-bearing tolerance (hours/day)  no pain or discomfort with standing >15 minutes  Residual limb condition  pt reports no issues                               PT Education - 03/16/16 1835    Education provided Yes   Education Details use of car cane (handle to insert in door frame of car) and car handle that hooks on car door.   Person(s) Educated Patient;Spouse   Methods Explanation;Verbal cues;Other (comment)  internet to locate items   Comprehension Verbalized understanding          PT Short Term Goals - 02/21/16 1328    PT SHORT TERM GOAL #1   Title Patient donnes bilateral prostheses correctly and verbalizes proper cleaning of prostheses. (Target Date: 01/24/2016)   Baseline MET 01/20/2016   Time 1   Period Months   Status Achieved   PT SHORT TERM GOAL #2   Title Patient tolerates wear of bilateral prostheses >8 hrs total per day without skin issues or limb tenderness. (Target Date: 01/24/2016)   Baseline MET 01/20/2016   Time 1   Period Months   Status Achieved   PT SHORT TERM GOAL #3   Title Sit to/from stand transfers from w/c to RW with bilateral prostheses with supervision. (Target Date: 01/24/2016)   Baseline MET 01/20/2016   Time 1   Period Months   Status Achieved   PT SHORT TERM GOAL #4   Title Patient reaches 7" anteriorly, laterally, across midline and to floor with RW support with bilateral prostheses with supervision. (Target Date: 01/24/2016) new Target Date 02/18/2016   Baseline MET 02/15/2016   Time 1   Period Months   Status Achieved   PT SHORT TERM GOAL #5   Title Patient ambulates 100' with RW & bilateral prostheses with minA. (Target Date: 01/24/2016)   Baseline MET 01/20/2016   Time 1   Period Months   Status Achieved   PT SHORT TERM GOAL #6   Title Patient able to sit to/from stand chair without armrests using UEs to device with minA (Target Date 02/18/2016)   Baseline  MET 02/21/2016 to RW   Time 4   Period Weeks   Status Achieved   PT SHORT TERM GOAL #7   Title Patient able to manage pants with RW support for toileting with supervision. (Target Date 02/18/2016)   Baseline MET 02/15/2016   Time 4   Period Weeks   Status Achieved   PT SHORT TERM GOAL #8   Title Patient ambulates 100' with crutches & prostheses with supervision. (Target Date 02/18/2016)   Baseline MET 02/15/2016   Status Achieved   PT SHORT TERM GOAL #9   TITLE Patient negotiates ramps, curbs with RW and stairs with 1 rail & 1 crutch with prostheses with supervision. (Target Date 02/18/2016)   Baseline MET with RW 02/21/2016   Time 4   Status Achieved  PT Long Term Goals - 03/14/16 1331    PT LONG TERM GOAL #1   Title Patient verbalizes & demonstrates proper prosthetic care to enable safe use of prostheses. (Target Date: 03/17/2016)   Baseline MET 03/14/2016   Time 12   Period Weeks   Status Achieved   PT LONG TERM GOAL #2   Title Patient tolerates wear of bilateral prostheses >90% of awake hours without skin issues or limb tenderness to enable function throughout his day.  (Target Date: 03/17/2016)   Baseline MET 03/14/2016   Time 12   Period Weeks   Status Achieved   PT LONG TERM GOAL #3   Title Patient able to perform standing balance activities with UE support reaching >10" all directions & to floor modified independent.  (Target Date: 03/17/2016) NEW Target Date 04/18/2016   Baseline Progressing 03/14/2016 but not met. Patient reaches 5" anteriorly, laterally & to floor with one hand support on sturdy object like parallel bar with supervision.    Time 1   Period Months   Status On-going   PT LONG TERM GOAL #4   Title Patient ambulates 300' including grass surfaces with LRAD & bilateral prostheses modified independent to enable community mobility.  (Target Date: 03/17/2016) NEW Target Date 04/18/2016   Baseline Progressing but not met 03/14/2016. Patient ambulates 300' with  RW or forearm crutches with bilateral prostheses with close supervision. On grass he needs minA.    Time 1   Period Months   Status On-going   PT LONG TERM GOAL #5   Title Patient negotiates ramps, curbs & stairs with LRAD & bilateral prostheses modified independent to enable community mobility.  (Target Date: 03/17/2016) NEW Target Date 04/18/2016   Baseline progressing 03/14/2016 but not met. Patient negotiates stairs with 1 rail & 1 forearm crutch with supervision and ramps / curbs with forearm crutches with minA or RW with supervision.    Time 1   Period Months   Status On-going   PT LONG TERM GOAL #6   Title Patient ambulates household around furniture with [facility] & bilateral prostheses modified independent.  (Target Date: 03/17/2016) NEW Target Date 04/18/2016   Baseline Partially MET with RW 03/14/2016   Time 12   Period Weeks   Status Partially Met               Plan - 03/16/16 1839    Clinical Impression Statement Patient appears would benefit from use of car cane for sit to stand from the car. Patient was able to use crutches to ambulate from clinic to car with PT instruction.    Rehab Potential Good   PT Frequency 1x / week   PT Duration 4 weeks   PT Treatment/Interventions ADLs/Self Care Home Management;DME Instruction;Gait training;Stair training;Functional mobility training;Therapeutic activities;Therapeutic exercise;Balance training;Neuromuscular re-education;Patient/family education;Prosthetic Training   PT Next Visit Plan prosthetic gait & balance with forearm crutches including barriers, work on sit to/from stand   Consulted and Agree with Plan of Care Patient      Patient will benefit from skilled therapeutic intervention in order to improve the following deficits and impairments:  Abnormal gait, Decreased activity tolerance, Decreased balance, Decreased knowledge of precautions, Decreased knowledge of use of DME, Decreased mobility, Postural dysfunction, Prosthetic  Dependency, Improper body mechanics  Visit Diagnosis: Other abnormalities of gait and mobility  Unsteadiness on feet  Muscle weakness (generalized)     Problem List Patient Active Problem List   Diagnosis Date Noted  . Visit for wound care  07/19/2015  . Status post below knee amputation of right lower extremity ([place]) 05/14/2015  . History of left above knee amputation ([place]) 05/14/2015  . Toe amputation status ([place]) 04/28/2015  . OSA (obstructive sleep apnea) 02/23/2015  . Type 2 diabetes mellitus with other circulatory complications ([place]) 89/37/3428  . HLD (hyperlipidemia) 08/04/2014  . CKD (chronic kidney disease) 06/28/2014  . TIA (transient ischemic attack) 06/27/2014  . OBESITY, UNSPECIFIED 08/17/2010  . SYNCOPE 08/17/2010  . Hyperlipidemia 08/11/2010  . Essential hypertension 08/11/2010    Jamey Reas PT, DPT 03/17/2016, 6:41 PM  [facility] [address] [place], [place], [address] Phone: [phone]   Fax:  [phone]  Name: [name] MRN: [id number] Date of Birth: 1951/02/08

## 2016-03-21 ENCOUNTER — Ambulatory Visit: Payer: BLUE CROSS/BLUE SHIELD | Admitting: Physical Therapy

## 2016-03-21 DIAGNOSIS — R2681 Unsteadiness on feet: Secondary | ICD-10-CM

## 2016-03-21 DIAGNOSIS — R2689 Other abnormalities of gait and mobility: Secondary | ICD-10-CM

## 2016-03-21 DIAGNOSIS — M6281 Muscle weakness (generalized): Secondary | ICD-10-CM

## 2016-03-21 NOTE — Therapy (Signed)
[facility] [address] [place], [place], [address] Phone: [phone]   Fax:  [phone]  Physical Therapy Treatment  Patient Details  Name: [name] MRN: [id number] Date of Birth: 08-Dec-1950 Referring Provider: Rodell Perna, MD  Encounter Date: 03/21/2016      PT End of Session - 03/21/16 0935    Visit Number 24   Number of Visits 27   Date for PT Re-Evaluation 04/18/16   Authorization Type [employer]   PT Start Time 0929   PT Stop Time 1017   PT Time Calculation (min) 48 min   Equipment Utilized During Treatment Gait belt   Activity Tolerance Patient tolerated treatment well   Behavior During Therapy [facility] for tasks assessed/performed      Past Medical History  Diagnosis Date  . Hypertension   . Hyperlipidemia   . Necrosis ([place])     #2 nail   . Myocardial infarction ([place])     "mild" heart attack  . Poor circulation of extremity ([place])   . Sleep apnea     uses cpap, getting a new one  . Type 2 Diabetes mellitus     Type 2  . Arthritis   . Pneumonia     as a child  . CKD (chronic kidney disease) 06/28/2014    Sees Dr Florene Glen  . Cerebrovascular disease     MRI shows Right carotid inferior cavernours narrowing 75% and  50-75% stenosis of Cavernous and supraclinoi right side    Past Surgical History  Procedure Laterality Date  . Above knee leg amputation Left 2004  . Cataract extraction w/phaco  09/05/2012    Procedure: CATARACT EXTRACTION PHACO AND INTRAOCULAR LENS PLACEMENT (IOC);  Surgeon: Tonny Branch, MD;  Location: [facility];  Service: Ophthalmology;  Laterality: Left;  CDE=5.45  . Cataract extraction w/phaco  10/03/2012    Procedure: CATARACT EXTRACTION PHACO AND INTRAOCULAR LENS PLACEMENT (IOC);  Surgeon: Tonny Branch, MD;  Location: [facility];  Service: Ophthalmology;  Laterality: Right;  CDE: 12.31  . Colonoscopy    . Amputation Right 04/28/2015    Procedure: AMPUTATION RAY, RIGHT 5TH TOE;  Surgeon: Marybelle Killings, MD;  Location: [facility];  Service: Orthopedics;  Laterality: Right;  . Amputation Right 05/10/2015    Procedure: Right Below Knee Amputation;  Surgeon: Marybelle Killings, MD;  Location: [facility];  Service: Orthopedics;  Laterality: Right;    There were no vitals filed for this visit.      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 No falls or skin issues since last visit    Patient is accompained by: Family member   Pertinent History IDDM, CKD, HTN, MI, TIA, Left Transfemoral Amputation, arthriits, neuropathy, obesity   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, church (pastor) stand up to preach,    Currently in Pain? No/denies                         [facility] Adult PT Treatment/Exercise - 03/21/16 0934    Transfers   Transfers Sit to Stand;Stand to Sit;Floor to Transfer   Sit to Stand 4: Min guard;5: Supervision;With upper extremity assist;With armrests;From chair/3-in-1  supervision with use of armrests, Min guard with no armrests   Sit to  Stand Details Verbal cues for technique;Verbal cues for precautions/safety   Sit to Stand Details (indicate cue type and reason) Verbal cuing for scooting to edge of chair before standing up  use of B forearm crutches   Stand to Sit 5: Supervision;With upper extremity assist;With armrests;To chair/3-in-1  from forearm crutches    Stand to Sit Details (indicate cue type and reason) Verbal cues for technique;Verbal cues for precautions/safety;Verbal cues for safe use of DME/AE   Ambulation/Gait   Ambulation/Gait Yes   Ambulation/Gait Assistance 5: Supervision;4: Min assist  supervision with B forearm crutches, Min A for single   Ambulation/Gait Assistance Details verbal cues for shifting weight and toe placement gait with R single  forearm crutch, increased sway and use of UE with single crutch   Ambulation Distance (Feet) 260 Feet  80' X 2, 20' X 5 with single crutch along counter   Assistive device Lofstrands;Prostheses;R Forearm Crutch  single crutch near counter with intermittent touching   Gait Pattern Decreased stride length;Decreased hip/knee flexion - left;Trunk flexed;Step-through pattern   Ambulation Surface Indoor;Level   Stairs Yes   Stairs Assistance 5: Supervision   Stair Management Technique One rail Left;With crutches;Forwards;Step to pattern   Number of Stairs 4   Height of Stairs 6   Ramp 4: Min assist  forearm crutches & prostheses   Ramp Details (indicate cue type and reason) --   Curb 4: Min assist  forearm crutches & prostheses   Curb Details (indicate cue type and reason) verbal cuing for sequencing of bilateral forearm crutches and min assist for maintaing balance   Static Standing Balance   Static Standing - Balance Support Right upper extremity supported;Left upper extremity supported;No upper extremity supported   Static Standing - Level of Assistance 5: Stand by assistance   Static Standing - Comment/# of Minutes maintain balance in // on level surfaces then on foam and with eyes closed, held on to R railing then progressed to no hands, held with one hand on foam for 1 minute   Dynamic Standing Balance   Dynamic Standing - Balance Support Left upper extremity supported;Right upper extremity supported   Dynamic Standing - Level of Assistance 5: Stand by assistance   Head turns comments: Supervision with horizontal head turns on foam use of R handrail in // bars   Head nods comments: Supervision with vertical head turns on foam use of R handrail in // bars   Prosthetics   Current prosthetic wear tolerance (days/week)  daily   Current prosthetic wear tolerance (#hours/day)  wearing all awake hours drying prn   Current prosthetic weight-bearing tolerance (hours/day)  no pain or discomfort  with standing >15 minutes   Residual limb condition  pt reports no issues                PT Education - 03/21/16 0935    Education provided Yes   Person(s) Educated Patient   Methods Explanation   Comprehension Verbalized understanding          PT Short Term Goals - 02/21/16 1328    PT SHORT TERM GOAL #1   Title Patient donnes bilateral prostheses correctly and verbalizes proper cleaning of prostheses. (Target Date: 01/24/2016)   Baseline MET 01/20/2016   Time 1   Period Months   Status Achieved   PT SHORT TERM GOAL #2   Title Patient tolerates wear of bilateral prostheses >8 hrs total per day without skin issues or limb tenderness. (Target Date: 01/24/2016)   Baseline  MET 01/20/2016   Time 1   Period Months   Status Achieved   PT SHORT TERM GOAL #3   Title Sit to/from stand transfers from w/c to RW with bilateral prostheses with supervision. (Target Date: 01/24/2016)   Baseline MET 01/20/2016   Time 1   Period Months   Status Achieved   PT SHORT TERM GOAL #4   Title Patient reaches 7" anteriorly, laterally, across midline and to floor with RW support with bilateral prostheses with supervision. (Target Date: 01/24/2016) new Target Date 02/18/2016   Baseline MET 02/15/2016   Time 1   Period Months   Status Achieved   PT SHORT TERM GOAL #5   Title Patient ambulates 100' with RW & bilateral prostheses with minA. (Target Date: 01/24/2016)   Baseline MET 01/20/2016   Time 1   Period Months   Status Achieved   PT SHORT TERM GOAL #6   Title Patient able to sit to/from stand chair without armrests using UEs to device with minA (Target Date 02/18/2016)   Baseline MET 02/21/2016 to RW   Time 4   Period Weeks   Status Achieved   PT SHORT TERM GOAL #7   Title Patient able to manage pants with RW support for toileting with supervision. (Target Date 02/18/2016)   Baseline MET 02/15/2016   Time 4   Period Weeks   Status Achieved   PT SHORT TERM GOAL #8   Title Patient ambulates  100' with crutches & prostheses with supervision. (Target Date 02/18/2016)   Baseline MET 02/15/2016   Status Achieved   PT SHORT TERM GOAL #9   TITLE Patient negotiates ramps, curbs with RW and stairs with 1 rail & 1 crutch with prostheses with supervision. (Target Date 02/18/2016)   Baseline MET with RW 02/21/2016   Time 4   Status Achieved           PT Long Term Goals - 03/14/16 1331    PT LONG TERM GOAL #1   Title Patient verbalizes & demonstrates proper prosthetic care to enable safe use of prostheses. (Target Date: 03/17/2016)   Baseline MET 03/14/2016   Time 12   Period Weeks   Status Achieved   PT LONG TERM GOAL #2   Title Patient tolerates wear of bilateral prostheses >90% of awake hours without skin issues or limb tenderness to enable function throughout his day.  (Target Date: 03/17/2016)   Baseline MET 03/14/2016   Time 12   Period Weeks   Status Achieved   PT LONG TERM GOAL #3   Title Patient able to perform standing balance activities with UE support reaching >10" all directions & to floor modified independent.  (Target Date: 03/17/2016) NEW Target Date 04/18/2016   Baseline Progressing 03/14/2016 but not met. Patient reaches 5" anteriorly, laterally & to floor with one hand support on sturdy object like parallel bar with supervision.    Time 1   Period Months   Status On-going   PT LONG TERM GOAL #4   Title Patient ambulates 300' including grass surfaces with LRAD & bilateral prostheses modified independent to enable community mobility.  (Target Date: 03/17/2016) NEW Target Date 04/18/2016   Baseline Progressing but not met 03/14/2016. Patient ambulates 300' with RW or forearm crutches with bilateral prostheses with close supervision. On grass he needs minA.    Time 1   Period Months   Status On-going   PT LONG TERM GOAL #5   Title Patient negotiates ramps, curbs & stairs with  LRAD & bilateral prostheses modified independent to enable community mobility.  (Target Date:  03/17/2016) NEW Target Date 04/18/2016   Baseline progressing 03/14/2016 but not met. Patient negotiates stairs with 1 rail & 1 forearm crutch with supervision and ramps / curbs with forearm crutches with minA or RW with supervision.    Time 1   Period Months   Status On-going   PT LONG TERM GOAL #6   Title Patient ambulates household around furniture with [facility] & bilateral prostheses modified independent.  (Target Date: 03/17/2016) NEW Target Date 04/18/2016   Baseline Partially MET with RW 03/14/2016   Time 12   Period Weeks   Status Partially Met               Plan - 03/21/16 5462    Clinical Impression Statement Patient able to ambulate with supervision wiht bilateral forearm crutches, progressing to use of single forearm crutch in R hand which required Min A to maintain balance and for verbal cues for weightshifting    Rehab Potential Good   PT Frequency 1x / week   PT Duration 4 weeks   PT Treatment/Interventions ADLs/Self Care Home Management;DME Instruction;Gait training;Stair training;Functional mobility training;Therapeutic activities;Therapeutic exercise;Balance training;Neuromuscular re-education;Patient/family education;Prosthetic Training   PT Next Visit Plan prosthetic gait & balance with forearm crutches including barriers, curbs, ramp and stair management   Consulted and Agree with Plan of Care Patient      Patient will benefit from skilled therapeutic intervention in order to improve the following deficits and impairments:  Abnormal gait, Decreased activity tolerance, Decreased balance, Decreased knowledge of precautions, Decreased knowledge of use of DME, Decreased mobility, Postural dysfunction, Prosthetic Dependency, Improper body mechanics  Visit Diagnosis: Other abnormalities of gait and mobility  Unsteadiness on feet  Muscle weakness (generalized)     Problem List Patient Active Problem List   Diagnosis Date Noted  . Visit for wound care 07/19/2015  .  Status post below knee amputation of right lower extremity ([place]) 05/14/2015  . History of left above knee amputation ([place]) 05/14/2015  . Toe amputation status ([place]) 04/28/2015  . OSA (obstructive sleep apnea) 02/23/2015  . Type 2 diabetes mellitus with other circulatory complications ([place]) 70/35/0093  . HLD (hyperlipidemia) 08/04/2014  . CKD (chronic kidney disease) 06/28/2014  . TIA (transient ischemic attack) 06/27/2014  . OBESITY, UNSPECIFIED 08/17/2010  . SYNCOPE 08/17/2010  . Hyperlipidemia 08/11/2010  . Essential hypertension 08/11/2010   Dillard Essex, SPT 03/21/2016, 2:00 PM  Jamey Reas, PT, DPT PT Specializing in [place] 03/21/2016 2:20 PM Phone:  [phone]  Fax:  [phone] [facility] [address] [place], [place] [address]   [facility] [address] [place], [place], [address] Phone: [phone]   Fax:  [phone]  Name: [name] MRN: [id number] Date of Birth: Feb 29, 1951

## 2016-03-23 ENCOUNTER — Other Ambulatory Visit: Payer: Self-pay | Admitting: "Endocrinology

## 2016-03-28 ENCOUNTER — Other Ambulatory Visit: Payer: Self-pay | Admitting: Family Medicine

## 2016-03-28 NOTE — Telephone Encounter (Signed)
Refill appropriate and filled per protocol. 

## 2016-03-29 ENCOUNTER — Ambulatory Visit: Payer: BLUE CROSS/BLUE SHIELD | Admitting: Physical Therapy

## 2016-03-29 DIAGNOSIS — M6281 Muscle weakness (generalized): Secondary | ICD-10-CM

## 2016-03-29 DIAGNOSIS — R269 Unspecified abnormalities of gait and mobility: Secondary | ICD-10-CM

## 2016-03-29 DIAGNOSIS — R2681 Unsteadiness on feet: Secondary | ICD-10-CM

## 2016-03-29 DIAGNOSIS — R2689 Other abnormalities of gait and mobility: Secondary | ICD-10-CM

## 2016-03-29 NOTE — Therapy (Signed)
[facility] [address] [place], [place], [address] Phone: [phone]   Fax:  [phone]  Physical Therapy Treatment  Patient Details  Name: [name] MRN: [id number] Date of Birth: 12/02/1950 Referring Provider: Rodell Perna, MD  Encounter Date: 03/29/2016      PT End of Session - 03/29/16 1013    Visit Number 25   Number of Visits 27   Date for PT Re-Evaluation 04/18/16   Authorization Type BCBS   PT Start Time 0931   PT Stop Time 1012   PT Time Calculation (min) 41 min   Equipment Utilized During Treatment Gait belt   Activity Tolerance Patient tolerated treatment well   Behavior During Therapy [facility] for tasks assessed/performed      Past Medical History  Diagnosis Date  . Hypertension   . Hyperlipidemia   . Necrosis ([place])     #2 nail   . Myocardial infarction ([place])     "mild" heart attack  . Poor circulation of extremity ([place])   . Sleep apnea     uses cpap, getting a new one  . Type 2 Diabetes mellitus     Type 2  . Arthritis   . Pneumonia     as a child  . CKD (chronic kidney disease) 06/28/2014    Sees Dr Florene Glen  . Cerebrovascular disease     MRI shows Right carotid inferior cavernours narrowing 75% and  50-75% stenosis of Cavernous and supraclinoi right side    Past Surgical History  Procedure Laterality Date  . Above knee leg amputation Left 2004  . Cataract extraction w/phaco  09/05/2012    Procedure: CATARACT EXTRACTION PHACO AND INTRAOCULAR LENS PLACEMENT (IOC);  Surgeon: Tonny Branch, MD;  [facility];  Service: Ophthalmology;  Laterality: Left;  CDE=5.45  . Cataract extraction w/phaco  10/03/2012    Procedure: CATARACT EXTRACTION PHACO AND INTRAOCULAR LENS PLACEMENT (IOC);  Surgeon: Tonny Branch, MD;  Location: [facility];  Service: Ophthalmology;  Laterality: Right;  CDE: 12.31  . Colonoscopy    . Amputation Right 04/28/2015    Procedure: AMPUTATION RAY, RIGHT 5TH TOE;  Surgeon: Marybelle Killings, MD;  Location: [facility];  Service: Orthopedics;  Laterality: Right;  . Amputation Right 05/10/2015    Procedure: Right Below Knee Amputation;  Surgeon: Marybelle Killings, MD;  Location: [facility];  Service: Orthopedics;  Laterality: Right;    There were no vitals filed for this visit.      Subjective Assessment - 03/29/16 0939    Subjective (p) No falls, no skin issues, working on handrail but pt reports it may take awhile. Friend states he has been going up stairs outside of church ,one rail and crutch without assistance. Sat at desk this week, stationary chair.   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 (p) No/denies                         [facility] Adult PT Treatment/Exercise - 03/29/16 1003    Transfers   Transfers Sit to Stand;Stand to Sit;Floor to Transfer   Sit to Stand 4: Min guard;5: Supervision;With upper extremity assist;With armrests;From chair/3-in-1  supervision with use of  armrests, Min guard with no armrests   Sit to Stand Details Verbal cues for technique;Verbal cues for precautions/safety   Sit to Stand Details (indicate cue type and reason) Cues for scooting to edge of chair before attempting sit to stand, has diffculty managing light weight chairs   Stand to Sit 5: Supervision;With upper extremity assist;With armrests;To chair/3-in-1  from forearm crutches    Stand to Sit Details (indicate cue type and reason) Verbal cues for technique;Verbal cues for precautions/safety;Verbal cues for safe use of DME/AE   Ambulation/Gait   Ambulation/Gait Yes   Ambulation/Gait Assistance 5: Supervision;4: Min assist  supervision with B forearm crutches, Min A for single   Ambulation/Gait  Assistance Details Ambualted indoors and outdoor in grass, pavement and gravel   Ambulation Distance (Feet) 115 Feet  x1 inside, 160 outside   Assistive device Lofstrands;Prostheses;R Forearm Crutch;L Forearm Crutch   Gait Pattern Decreased stride length;Decreased hip/knee flexion - left;Trunk flexed;Step-through pattern   Ambulation Surface Level;Unlevel;Indoor;Outdoor;Gravel;Grass   Stairs Yes   Stairs Assistance 5: Supervision   Stair Management Technique One rail Left;With crutches;Forwards;Step to pattern   Number of Stairs 4   Height of Stairs 6   Ramp 4: Min assist  forearm crutches & prostheses   Ramp Details (indicate cue type and reason) performed inside and outdoors   Curb 4: Min assist  forearm crutches & prostheses   Curb Details (indicate cue type and reason) cues for sequencing with bilateral forearm crutches  inside and outside   Static Standing Balance   Static Standing - Balance Support --   Static Standing - Level of Assistance --   Dynamic Standing Balance   Dynamic Standing - Balance Support --   Dynamic Standing - Level of Assistance --   Head turns comments: --   Head nods comments: --   Prosthetics   Prosthetic Care Comments  Educated on increasing stance time by shifting more weight onto above knee amputation prosthesis in locked position   Current prosthetic wear tolerance (days/week)  daily   Current prosthetic wear tolerance (#hours/day)  wearing all awake hours drying prn   Current prosthetic weight-bearing tolerance (hours/day)  --   Residual limb condition  pt reports no issues                PT Education - 03/29/16 1006    Education provided Yes   Education Details continuing to practice ramps and curbs to maintain    Person(s) Educated Spouse;Patient   Methods Explanation;Demonstration   Comprehension Verbalized understanding;Returned demonstration          PT Short Term Goals - 02/21/16 1328    PT SHORT TERM GOAL #1   Title  Patient donnes bilateral prostheses correctly and verbalizes proper cleaning of prostheses. (Target Date: 01/24/2016)   Baseline MET 01/20/2016   Time 1   Period Months   Status Achieved   PT SHORT TERM GOAL #2   Title Patient tolerates wear of bilateral prostheses >8 hrs total per day without skin issues or limb tenderness. (Target Date: 01/24/2016)   Baseline MET 01/20/2016   Time 1   Period Months   Status Achieved   PT SHORT TERM GOAL #3   Title Sit to/from stand transfers from w/c to RW with bilateral prostheses with supervision. (Target Date: 01/24/2016)   Baseline MET 01/20/2016   Time 1   Period Months   Status Achieved   PT SHORT TERM GOAL #4   Title Patient reaches 7" anteriorly, laterally, across midline  and to floor with RW support with bilateral prostheses with supervision. (Target Date: 01/24/2016) new Target Date 02/18/2016   Baseline MET 02/15/2016   Time 1   Period Months   Status Achieved   PT SHORT TERM GOAL #5   Title Patient ambulates 100' with RW & bilateral prostheses with minA. (Target Date: 01/24/2016)   Baseline MET 01/20/2016   Time 1   Period Months   Status Achieved   PT SHORT TERM GOAL #6   Title Patient able to sit to/from stand chair without armrests using UEs to device with minA (Target Date 02/18/2016)   Baseline MET 02/21/2016 to RW   Time 4   Period Weeks   Status Achieved   PT SHORT TERM GOAL #7   Title Patient able to manage pants with RW support for toileting with supervision. (Target Date 02/18/2016)   Baseline MET 02/15/2016   Time 4   Period Weeks   Status Achieved   PT SHORT TERM GOAL #8   Title Patient ambulates 100' with crutches & prostheses with supervision. (Target Date 02/18/2016)   Baseline MET 02/15/2016   Status Achieved   PT SHORT TERM GOAL #9   TITLE Patient negotiates ramps, curbs with RW and stairs with 1 rail & 1 crutch with prostheses with supervision. (Target Date 02/18/2016)   Baseline MET with RW 02/21/2016   Time 4   Status  Achieved           PT Long Term Goals - 03/14/16 1331    PT LONG TERM GOAL #1   Title Patient verbalizes & demonstrates proper prosthetic care to enable safe use of prostheses. (Target Date: 03/17/2016)   Baseline MET 03/14/2016   Time 12   Period Weeks   Status Achieved   PT LONG TERM GOAL #2   Title Patient tolerates wear of bilateral prostheses >90% of awake hours without skin issues or limb tenderness to enable function throughout his day.  (Target Date: 03/17/2016)   Baseline MET 03/14/2016   Time 12   Period Weeks   Status Achieved   PT LONG TERM GOAL #3   Title Patient able to perform standing balance activities with UE support reaching >10" all directions & to floor modified independent.  (Target Date: 03/17/2016) NEW Target Date 04/18/2016   Baseline Progressing 03/14/2016 but not met. Patient reaches 5" anteriorly, laterally & to floor with one hand support on sturdy object like parallel bar with supervision.    Time 1   Period Months   Status On-going   PT LONG TERM GOAL #4   Title Patient ambulates 300' including grass surfaces with LRAD & bilateral prostheses modified independent to enable community mobility.  (Target Date: 03/17/2016) NEW Target Date 04/18/2016   Baseline Progressing but not met 03/14/2016. Patient ambulates 300' with RW or forearm crutches with bilateral prostheses with close supervision. On grass he needs minA.    Time 1   Period Months   Status On-going   PT LONG TERM GOAL #5   Title Patient negotiates ramps, curbs & stairs with LRAD & bilateral prostheses modified independent to enable community mobility.  (Target Date: 03/17/2016) NEW Target Date 04/18/2016   Baseline progressing 03/14/2016 but not met. Patient negotiates stairs with 1 rail & 1 forearm crutch with supervision and ramps / curbs with forearm crutches with minA or RW with supervision.    Time 1   Period Months   Status On-going   PT LONG TERM GOAL #6   Title  Patient ambulates household  around furniture with [facility] & bilateral prostheses modified independent.  (Target Date: 03/17/2016) NEW Target Date 04/18/2016   Baseline Partially MET with RW 03/14/2016   Time 12   Period Weeks   Status Partially Met               Plan - 03/29/16 1225    Clinical Impression Statement Todays session focused on prosthetic gait in gravel, grass and unlevel surfaces, pt was supervision with these tasks. Pt still requires cuing for sequencing of forearm crutches with curbs. Dicussed discharge next visit with patient and he is in agreement.   Rehab Potential Good   PT Frequency 1x / week   PT Duration 4 weeks   PT Treatment/Interventions ADLs/Self Care Home Management;DME Instruction;Gait training;Stair training;Functional mobility training;Therapeutic activities;Therapeutic exercise;Balance training;Neuromuscular re-education;Patient/family education;Prosthetic Training   PT Next Visit Plan prosthetic gait & balance with forearm crutches including barriers, curbs, ramp and stair management   Consulted and Agree with Plan of Care Patient      Patient will benefit from skilled therapeutic intervention in order to improve the following deficits and impairments:  Abnormal gait, Decreased activity tolerance, Decreased balance, Decreased knowledge of precautions, Decreased knowledge of use of DME, Decreased mobility, Postural dysfunction, Prosthetic Dependency, Improper body mechanics  Visit Diagnosis: Other abnormalities of gait and mobility  Unsteadiness on feet  Muscle weakness (generalized)  Abnormality of gait     Problem List Patient Active Problem List   Diagnosis Date Noted  . Visit for wound care 07/19/2015  . Status post below knee amputation of right lower extremity ([place]) 05/14/2015  . History of left above knee amputation ([place]) 05/14/2015  . Toe amputation status ([place]) 04/28/2015  . OSA (obstructive sleep apnea) 02/23/2015  . Type 2 diabetes mellitus with other  circulatory complications ([place]) 41/93/7902  . HLD (hyperlipidemia) 08/04/2014  . CKD (chronic kidney disease) 06/28/2014  . TIA (transient ischemic attack) 06/27/2014  . OBESITY, UNSPECIFIED 08/17/2010  . SYNCOPE 08/17/2010  . Hyperlipidemia 08/11/2010  . Essential hypertension 08/11/2010    Dillard Essex, SPT 03/29/2016, 12:30 PM  [facility] [address] [place], [place], [address] Phone: [phone]   Fax:  [phone]  Name: [name] MRN: [id number] Date of Birth: 1951/08/22

## 2016-04-11 ENCOUNTER — Ambulatory Visit: Payer: BLUE CROSS/BLUE SHIELD | Attending: Orthopaedic Surgery | Admitting: Physical Therapy

## 2016-04-11 DIAGNOSIS — R2681 Unsteadiness on feet: Secondary | ICD-10-CM | POA: Insufficient documentation

## 2016-04-11 DIAGNOSIS — R2689 Other abnormalities of gait and mobility: Secondary | ICD-10-CM | POA: Diagnosis not present

## 2016-04-11 DIAGNOSIS — M6281 Muscle weakness (generalized): Secondary | ICD-10-CM | POA: Diagnosis present

## 2016-04-11 NOTE — Therapy (Signed)
[facility] [address] [place], [place], [address] Phone: [phone]   Fax:  [phone]  Physical Therapy Treatment  Patient Details  Name: FRANCES JOYNT MRN: [id number] Date of Birth: 01/26/1951 Referring Provider: Rodell Perna, MD  Encounter Date: 04/11/2016      PT End of Session - 04/11/16 1053    Visit Number 26   Number of Visits 27   Date for PT Re-Evaluation 04/18/16   Authorization Type BCBS   PT Start Time 1016   PT Stop Time 1100   PT Time Calculation (min) 44 min   Equipment Utilized During Treatment Gait belt   Activity Tolerance Patient tolerated treatment well   Behavior During Therapy [facility] for tasks assessed/performed      Past Medical History  Diagnosis Date  . Hypertension   . Hyperlipidemia   . Necrosis ([place])     #2 nail   . Myocardial infarction ([place])     "mild" heart attack  . Poor circulation of extremity ([place])   . Sleep apnea     uses cpap, getting a new one  . Type 2 Diabetes mellitus     Type 2  . Arthritis   . Pneumonia     as a child  . CKD (chronic kidney disease) 06/28/2014    Sees Dr Florene Glen  . Cerebrovascular disease     MRI shows Right carotid inferior cavernours narrowing 75% and  50-75% stenosis of Cavernous and supraclinoi right side    Past Surgical History  Procedure Laterality Date  . Above knee leg amputation Left 2004  . Cataract extraction w/phaco  09/05/2012    Procedure: CATARACT EXTRACTION PHACO AND INTRAOCULAR LENS PLACEMENT (IOC);  Surgeon: Tonny Branch, MD;  Location: [facility];  Service: Ophthalmology;  Laterality: Left;  CDE=5.45  . Cataract extraction w/phaco  10/03/2012    Procedure: CATARACT EXTRACTION PHACO AND INTRAOCULAR LENS PLACEMENT (IOC);  Surgeon: Tonny Branch, MD;  Location: [facility];  Service: Ophthalmology;  Laterality: Right;  CDE: 12.31  . Colonoscopy    . Amputation Right 04/28/2015    Procedure: AMPUTATION RAY, RIGHT 5TH TOE;  Surgeon: Marybelle Killings, MD;  Location: [facility];  Service: Orthopedics;  Laterality: Right;  . Amputation Right 05/10/2015    Procedure: Right Below Knee Amputation;  Surgeon: Marybelle Killings, MD;  Location: [facility];  Service: Orthopedics;  Laterality: Right;    There were no vitals filed for this visit.      Subjective Assessment - 04/11/16 1020    Subjective No skin or falls at conference used forearm crutches most the time used powerchair inside the actual conference, almost fell due to slippery floor but managed by taking smaller steps. 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 No/denies            [facility] PT Assessment - 04/11/16 1057    Observation/Other Assessments   Focus on Therapeutic Outcomes (FOTO)  63.48 Functional Status  46.78 Initial Functional Status   Activities of Balance Confidence Scale (ABC Scale)  81.3%  Initial was 0.0%   Fear Avoidance Belief Questionnaire (FABQ)  45(2)         Prosthetics Assessment -  04/11/16 [place] with Skin check;Residual limb care;Prosthetic cleaning;Ply sock cleaning;Correct ply sock adjustment;Proper wear schedule/adjustment;Proper weight-bearing schedule/adjustment                    [facility] Adult PT Treatment/Exercise - 04/11/16 1015    Transfers   Transfers Sit to Stand;Stand to Sit;Floor to Transfer   Sit to Stand 6: Modified independent (Device/Increase time);With upper extremity assist;With armrests;From chair/3-in-1   Sit to Stand Details (indicate cue type and reason) --   Stand to Sit 6: Modified independent (Device/Increase time);With upper extremity assist;With armrests;To chair/3-in-1  from  forearm crutches    Stand to Sit Details (indicate cue type and reason) --   Stand to Sit Details --   Ambulation/Gait   Ambulation/Gait Yes   Ambulation/Gait Assistance 6: Modified independent (Device/Increase time)   Ambulation Distance (Feet) 445 Feet  115 inside+300 outside   Assistive device Lofstrands;Prostheses;R Forearm Crutch;L Forearm Crutch   Gait Pattern Decreased stride length;Decreased hip/knee flexion - left;Trunk flexed;Step-through pattern   Ambulation Surface Level;Unlevel;Indoor;Outdoor   Stairs Yes   Stairs Assistance 6: Modified independent (Device/Increase time)   Stair Management Technique One rail Left;With crutches;Forwards;Step to pattern  with forearm crutch on the R   Number of Stairs 4   Height of Stairs 6   Ramp 6: Modified independent (Device)  forearm crutches & prostheses   Curb 6: Modified independent (Device/increase time)  forearm crutches & prostheses   Neuro Re-ed    Neuro Re-ed Details  Reaching anterior, lateral, across midline and to the floor >10 in each direction, able to pick up object from floor with modified independence, all with UE support of one forearm crutch in L hand   Prosthetics   Prosthetic Care Comments  --   Current prosthetic wear tolerance (days/week)  daily   Current prosthetic wear tolerance (#hours/day)  wearing all awake hours drying prn   Residual limb condition  pt reports no issues                PT Education - 04/11/16 1027    Education provided Yes   Education Details Rotating forearm crutch tips, following up with prosthetists    Person(s) Educated Patient;Spouse   Methods Explanation   Comprehension Verbalized understanding          PT Short Term Goals - 02/21/16 1328    PT SHORT TERM GOAL #1   Title Patient donnes bilateral prostheses correctly and verbalizes proper cleaning of prostheses. (Target Date: 01/24/2016)   Baseline MET 01/20/2016   Time 1   Period Months   Status Achieved   PT  SHORT TERM GOAL #2   Title Patient tolerates wear of bilateral prostheses >8 hrs total per day without skin issues or limb tenderness. (Target Date: 01/24/2016)   Baseline MET 01/20/2016   Time 1   Period Months   Status Achieved   PT SHORT TERM GOAL #3   Title Sit to/from stand transfers from w/c to RW with bilateral prostheses with supervision. (Target Date: 01/24/2016)   Baseline MET 01/20/2016   Time 1   Period Months   Status Achieved   PT SHORT TERM GOAL #4   Title Patient reaches 7" anteriorly, laterally, across midline and to floor with RW support with bilateral prostheses with supervision. (Target Date: 01/24/2016) new Target Date 02/18/2016   Baseline MET 02/15/2016   Time 1   Period Months   Status Achieved   PT  SHORT TERM GOAL #5   Title Patient ambulates 36' with RW & bilateral prostheses with minA. (Target Date: 01/24/2016)   Baseline MET 01/20/2016   Time 1   Period Months   Status Achieved   PT SHORT TERM GOAL #6   Title Patient able to sit to/from stand chair without armrests using UEs to device with minA (Target Date 02/18/2016)   Baseline MET 02/21/2016 to RW   Time 4   Period Weeks   Status Achieved   PT SHORT TERM GOAL #7   Title Patient able to manage pants with RW support for toileting with supervision. (Target Date 02/18/2016)   Baseline MET 02/15/2016   Time 4   Period Weeks   Status Achieved   PT SHORT TERM GOAL #8   Title Patient ambulates 100' with crutches & prostheses with supervision. (Target Date 02/18/2016)   Baseline MET 02/15/2016   Status Achieved   PT SHORT TERM GOAL #9   TITLE Patient negotiates ramps, curbs with RW and stairs with 1 rail & 1 crutch with prostheses with supervision. (Target Date 02/18/2016)   Baseline MET with RW 02/21/2016   Time 4   Status Achieved           PT Long Term Goals - 04/11/16 1048    PT LONG TERM GOAL #1   Title Patient verbalizes & demonstrates proper prosthetic care to enable safe use of prostheses. (Target  Date: 03/17/2016)   Baseline MET 03/14/2016   Time 12   Period Weeks   Status Achieved   PT LONG TERM GOAL #2   Title Patient tolerates wear of bilateral prostheses >90% of awake hours without skin issues or limb tenderness to enable function throughout his day.  (Target Date: 03/17/2016)   Baseline MET 03/14/2016   Time 12   Period Weeks   Status Achieved   PT LONG TERM GOAL #3   Title Patient able to perform standing balance activities with UE support reaching >10" all directions & to floor modified independent.  (Target Date: 03/17/2016) NEW Target Date 04/18/2016   Baseline Met 04/11/2016   Time 1   Period Months   Status Achieved   PT LONG TERM GOAL #4   Title Patient ambulates 300' including grass surfaces with LRAD & bilateral prostheses modified independent to enable community mobility.  (Target Date: 03/17/2016) NEW Target Date 04/18/2016   Baseline Met 04/11/16   Time 1   Period Months   Status Achieved   PT LONG TERM GOAL #5   Title Patient negotiates ramps, curbs & stairs with LRAD & bilateral prostheses modified independent to enable community mobility.  (Target Date: 03/17/2016) NEW Target Date 04/18/2016   Baseline Met 04/11/16   Time 1   Period Months   Status Achieved   PT LONG TERM GOAL #6   Title Patient ambulates household around furniture with [facility] & bilateral prostheses modified independent.  (Target Date: 03/17/2016) NEW Target Date 04/18/2016   Baseline Partially MET with forearm crutches 04/11/2016   Time 12   Period Weeks   Status Partially Met               Plan - 04/11/16 1054    Clinical Impression Statement Pt has met 3/4 LTGs and partially met the remaining one, pt is modfied indepedent wtih bilateral forearm crutches for community and home mobility and is ready for discharge. Patient indicates via FOTO improvement in mobility with Functional Status of  17 points and Activities of Balance Confidence  from 0 to 81.3%.    Rehab Potential Good   PT  Frequency 1x / week   PT Duration 4 weeks   PT Treatment/Interventions ADLs/Self Care Home Management;DME Instruction;Gait training;Stair training;Functional mobility training;Therapeutic activities;Therapeutic exercise;Balance training;Neuromuscular re-education;Patient/family education;Prosthetic Training   PT Next Visit Plan Discharge   Consulted and Agree with Plan of Care Patient      Patient will benefit from skilled therapeutic intervention in order to improve the following deficits and impairments:  Abnormal gait, Decreased activity tolerance, Decreased balance, Decreased knowledge of precautions, Decreased knowledge of use of DME, Decreased mobility, Postural dysfunction, Prosthetic Dependency, Improper body mechanics  Visit Diagnosis: Other abnormalities of gait and mobility  Unsteadiness on feet  Muscle weakness (generalized)     Problem List Patient Active Problem List   Diagnosis Date Noted  . Visit for wound care 07/19/2015  . Status post below knee amputation of right lower extremity ([place]) 05/14/2015  . History of left above knee amputation ([place]) 05/14/2015  . Toe amputation status ([place]) 04/28/2015  . OSA (obstructive sleep apnea) 02/23/2015  . Type 2 diabetes mellitus with other circulatory complications ([place]) 49/70/2637  . HLD (hyperlipidemia) 08/04/2014  . CKD (chronic kidney disease) 06/28/2014  . TIA (transient ischemic attack) 06/27/2014  . OBESITY, UNSPECIFIED 08/17/2010  . SYNCOPE 08/17/2010  . Hyperlipidemia 08/11/2010  . Essential hypertension 08/11/2010   PHYSICAL THERAPY DISCHARGE SUMMARY  Visits from Start of Care: 26  Current functional level related to goals / functional outcomes: See above   Remaining deficits: See above   Education / Equipment: See above Plan: Patient agrees to discharge.  Patient goals were met. Patient is being discharged due to meeting the stated rehab goals.  ?????        Dillard Essex, SPT 04/11/2016, 3:12  PM  Jamey Reas, PT, DPT PT Specializing in [place] 04/12/2016 8:02 AM Phone:  [phone]  Fax:  [phone] [facility] [address] [place], [place] [address]        [facility] [address] [place], [place], [address] Phone: [phone]   Fax:  [phone]  Name: JARMARCUS WAMBOLD MRN: [id number] Date of Birth: 09-01-1951

## 2016-04-18 ENCOUNTER — Encounter: Payer: BLUE CROSS/BLUE SHIELD | Admitting: Physical Therapy

## 2016-04-19 ENCOUNTER — Other Ambulatory Visit: Payer: Self-pay

## 2016-04-19 MED ORDER — METFORMIN HCL 500 MG PO TABS: 500.0000 mg | ORAL_TABLET | Freq: Two times a day (BID) | ORAL | Status: DC

## 2016-04-20 ENCOUNTER — Other Ambulatory Visit: Payer: Self-pay | Admitting: "Endocrinology

## 2016-04-20 LAB — BASIC METABOLIC PANEL
BUN: 28 mg/dL — ABNORMAL HIGH (ref 7–25)
CALCIUM: 9 mg/dL (ref 8.6–10.3)
CHLORIDE: 104 mmol/L (ref 98–110)
CO2: 24 mmol/L (ref 20–31)
Creat: 1.67 mg/dL — ABNORMAL HIGH (ref 0.70–1.25)
GLUCOSE: 143 mg/dL — AB (ref 65–99)
Potassium: 4.4 mmol/L (ref 3.5–5.3)
SODIUM: 140 mmol/L (ref 135–146)

## 2016-04-20 LAB — HEMOGLOBIN A1C
HEMOGLOBIN A1C: 7.1 % — AB (ref <5.7)
MEAN PLASMA GLUCOSE: 157 mg/dL

## 2016-04-27 ENCOUNTER — Ambulatory Visit (INDEPENDENT_AMBULATORY_CARE_PROVIDER_SITE_OTHER): Payer: BLUE CROSS/BLUE SHIELD | Admitting: "Endocrinology

## 2016-04-27 ENCOUNTER — Encounter: Payer: Self-pay | Admitting: "Endocrinology

## 2016-04-27 VITALS — BP 131/83 | HR 64 | Ht 70.0 in | Wt 239.0 lb

## 2016-04-27 DIAGNOSIS — I10 Essential (primary) hypertension: Secondary | ICD-10-CM | POA: Diagnosis not present

## 2016-04-27 DIAGNOSIS — E785 Hyperlipidemia, unspecified: Secondary | ICD-10-CM | POA: Diagnosis not present

## 2016-04-27 DIAGNOSIS — E1159 Type 2 diabetes mellitus with other circulatory complications: Secondary | ICD-10-CM | POA: Diagnosis not present

## 2016-04-27 MED ORDER — EXENATIDE 10 MCG/0.04ML ~~LOC~~ SOPN: PEN_INJECTOR | SUBCUTANEOUS | Status: DC

## 2016-04-27 MED ORDER — METFORMIN HCL 500 MG PO TABS: 500.0000 mg | ORAL_TABLET | Freq: Two times a day (BID) | ORAL | Status: DC

## 2016-04-27 NOTE — Progress Notes (Signed)
Subjective:    Patient ID: [name], male    DOB: [birth year], PCP Vic Blackbird, MD   Past Medical History  Diagnosis Date  . Hypertension   . Hyperlipidemia   . Necrosis ([place])     #2 nail   . Myocardial infarction ([place])     "mild" heart attack  . Poor circulation of extremity ([place])   . Sleep apnea     uses cpap, getting a new one  . Type 2 Diabetes mellitus     Type 2  . Arthritis   . Pneumonia     as a child  . CKD (chronic kidney disease) 06/28/2014    Sees Dr Florene Glen  . Cerebrovascular disease     MRI shows Right carotid inferior cavernours narrowing 75% and  50-75% stenosis of Cavernous and supraclinoi right side   Past Surgical History  Procedure Laterality Date  . Above knee leg amputation Left 2004  . Cataract extraction w/phaco  09/05/2012    Procedure: CATARACT EXTRACTION PHACO AND INTRAOCULAR LENS PLACEMENT (IOC);  Surgeon: Tonny Branch, MD;  Location: [facility];  Service: Ophthalmology;  Laterality: Left;  CDE=5.45  . Cataract extraction w/phaco  10/03/2012    Procedure: CATARACT EXTRACTION PHACO AND INTRAOCULAR LENS PLACEMENT (IOC);  Surgeon: Tonny Branch, MD;  Location: [facility];  Service: Ophthalmology;  Laterality: Right;  CDE: 12.31  . Colonoscopy    . Amputation Right 04/28/2015    Procedure: AMPUTATION RAY, RIGHT 5TH TOE;  Surgeon: Marybelle Killings, MD;  Location: [place];  Service: Orthopedics;  Laterality: Right;  . Amputation Right 05/10/2015    Procedure: Right Below Knee Amputation;  Surgeon: Marybelle Killings, MD;  Location: [place];  Service: Orthopedics;  Laterality: Right;   Social History   Social History  . Marital Status: Married    Spouse Name: N/A  . Number of Children: 2  . Years of Education: college   Occupational History  . Pastor    Social History Main Topics  . Smoking status: Never Smoker   . Smokeless tobacco: Never Used  . Alcohol Use: No  . Drug Use: No  . Sexual Activity: Not Asked   Other Topics Concern  . None   Social History  Narrative   Patient lives with his wife    Patient right handed   Patient drinks caffine on occ.   Outpatient Encounter Prescriptions as of 04/27/2016  Medication Sig  . acetaminophen (TYLENOL) 325 MG tablet Take 1-2 tablets (325-650 mg total) by mouth every 4 (four) hours as needed for mild pain.  Marland Kitchen atenolol (TENORMIN) 25 MG tablet Take 1 tablet (25 mg total) by mouth daily.  . Calcium Carb-Cholecalciferol (CALCIUM PLUS VITAMIN D3) 600-500 MG-UNIT CAPS Take 1 capsule by mouth daily.   . clopidogrel (PLAVIX) 75 MG tablet Take 1 tablet (75 mg total) by mouth daily.  Marland Kitchen exenatide (BYETTA 10 MCG PEN) 10 MCG/0.04ML SOPN injection INJECT 0.04 MLS (10 MCG TOTAL) INTO THE SKIN 2 (TWO) TIMES DAILY WITH A MEAL.  . furosemide (LASIX) 40 MG tablet Take 1 tablet (40 mg total) by mouth daily.  . metFORMIN (GLUCOPHAGE) 500 MG tablet Take 1 tablet (500 mg total) by mouth 2 (two) times daily with a meal.  . rosuvastatin (CRESTOR) 20 MG tablet TAKE 1 TABLET BY MOUTH EVERY DAY  . [DISCONTINUED] BYETTA 10 MCG PEN 10 MCG/0.04ML SOPN injection INJECT 0.04 MLS (10 MCG TOTAL) INTO THE SKIN 2 (TWO) TIMES DAILY WITH A MEAL.  . [  DISCONTINUED] metFORMIN (GLUCOPHAGE) 500 MG tablet Take 1 tablet (500 mg total) by mouth 2 (two) times daily with a meal.   No facility-administered encounter medications on file as of 04/27/2016.   ALLERGIES: Allergies  Allergen Reactions  . Zolpidem Tartrate Other (See Comments)    disorientation    VACCINATION STATUS: Immunization History  Administered Date(s) Administered  . Influenza Split 06/30/2013  . Influenza-Unspecified 07/30/2015  . Pneumococcal Polysaccharide-23 07/31/2007  . Td 10/30/2002    Diabetes He presents for his follow-up diabetic visit. He has type 2 diabetes mellitus. Onset time: He was diagnosed at approximate age of 5 years. His disease course has been stable. There are no hypoglycemic associated symptoms. Pertinent negatives for hypoglycemia include no  confusion, headaches, pallor or seizures. There are no diabetic associated symptoms. Pertinent negatives for diabetes include no chest pain, no fatigue, no polydipsia, no polyphagia, no polyuria and no weakness. There are no hypoglycemic complications. Symptoms are stable. Diabetic complications include PVD. (Status post bilateral lower extremity amputations. He is wheelchair-bound, awaiting for leg prosthetics.) Risk factors for coronary artery disease include diabetes mellitus, dyslipidemia, hypertension, male sex, obesity, sedentary lifestyle and tobacco exposure. Current diabetic treatment includes insulin injections (Byetta 10 g subcutaneous twice a day.). He is compliant with treatment most of the time. His weight is decreasing steadily (Overall he lost 75 pounds since 2012, including his amputations.). He is following a diabetic diet. When asked about meal planning, he reported none. He has had a previous visit with a dietitian. He never participates in exercise. His home blood glucose trend is decreasing steadily. An ACE inhibitor/angiotensin II receptor blocker is being taken.  Hyperlipidemia This is a chronic problem. The current episode started more than 1 year ago. The problem is controlled. Pertinent negatives include no chest pain, myalgias or shortness of breath. Current antihyperlipidemic treatment includes statins. Risk factors for coronary artery disease include dyslipidemia, diabetes mellitus, hypertension, male sex, obesity and a sedentary lifestyle.  Hypertension This is a chronic problem. The current episode started more than 1 year ago. Pertinent negatives include no chest pain, headaches, neck pain, palpitations or shortness of breath. Risk factors for coronary artery disease include diabetes mellitus, dyslipidemia, male gender, obesity, sedentary lifestyle and smoking/tobacco exposure. Hypertensive end-organ damage includes PVD.     Review of Systems  Constitutional: Negative for  fatigue and unexpected weight change.  HENT: Negative for dental problem, mouth sores and trouble swallowing.   Eyes: Negative for visual disturbance.  Respiratory: Negative for cough, choking, chest tightness, shortness of breath and wheezing.   Cardiovascular: Negative for chest pain, palpitations and leg swelling.  Gastrointestinal: Negative for nausea, vomiting, abdominal pain, diarrhea, constipation and abdominal distention.  Endocrine: Negative for polydipsia, polyphagia and polyuria.  Genitourinary: Negative for dysuria, urgency, hematuria and flank pain.  Musculoskeletal: Positive for gait problem. Negative for myalgias, back pain and neck pain.       He now walks with bilateral leg prostheses and crutches.  Skin: Negative for pallor, rash and wound.  Neurological: Negative for seizures, syncope, weakness, numbness and headaches.  Psychiatric/Behavioral: Negative.  Negative for suicidal ideas, confusion and dysphoric mood.    Objective:    BP 131/83 mmHg  Pulse 64  Ht 5\' 10"  (1.778 m)  Wt 239 lb (108.41 kg)  BMI 34.29 kg/m2  Wt Readings from Last 3 Encounters:  04/27/16 239 lb (108.41 kg)  05/19/15 212 lb 0.3 oz (96.172 kg)  05/10/15 245 lb 5 oz (111.273 kg)    Physical  Exam  Constitutional: He is oriented to person, place, and time. He appears well-developed and well-nourished. He is cooperative. No distress.  HENT:  Head: Normocephalic and atraumatic.  Eyes: EOM are normal.  Neck: Normal range of motion. Neck supple. No tracheal deviation present. No thyromegaly present.  Cardiovascular: Normal rate, S1 normal, S2 normal and normal heart sounds.  Exam reveals no gallop.   No murmur heard. Pulses:      Dorsalis pedis pulses are 1+ on the right side, and 1+ on the left side.       Posterior tibial pulses are 1+ on the right side, and 1+ on the left side.  Pulmonary/Chest: Breath sounds normal. No respiratory distress. He has no wheezes.  Abdominal: Soft. Bowel sounds  are normal. He exhibits no distension. There is no tenderness. There is no guarding and no CVA tenderness.  Musculoskeletal:       Right shoulder: He exhibits no swelling and no deformity.  He now walks with bilateral leg prostheses and crutches.  Neurological: He is alert and oriented to person, place, and time. He has normal strength and normal reflexes. No cranial nerve deficit or sensory deficit. Gait normal.  Skin: Skin is warm and dry. No rash noted. No cyanosis. Nails show no clubbing.  Psychiatric: He has a normal mood and affect. His speech is normal and behavior is normal. Judgment and thought content normal. Cognition and memory are normal.    Results for orders placed or performed in visit on [id number]  Basic metabolic panel  Result Value Ref Range   Sodium 140 135 - 146 mmol/L   Potassium 4.4 3.5 - 5.3 mmol/L   Chloride 104 98 - 110 mmol/L   CO2 24 20 - 31 mmol/L   Glucose, Bld 143 (H) 65 - 99 mg/dL   BUN 28 (H) 7 - 25 mg/dL   Creat 1.67 (H) 0.70 - 1.25 mg/dL   Calcium 9.0 8.6 - 10.3 mg/dL  Hemoglobin A1c  Result Value Ref Range   Hgb A1c MFr Bld 7.1 (H) <5.7 %   Mean Plasma Glucose 157 mg/dL   Complete Blood Count (Most recent): Lab Results  Component Value Date   WBC 7.6 05/20/2015   HGB 8.3* 05/20/2015   HCT 25.4* 05/20/2015   MCV 87.3 05/20/2015   PLT 380 05/20/2015   Chemistry (most recent): Lab Results  Component Value Date   NA 140 04/20/2016   K 4.4 04/20/2016   CL 104 04/20/2016   CO2 24 04/20/2016   BUN 28* 04/20/2016   CREATININE 1.67* 04/20/2016   Diabetic Labs (most recent): Lab Results  Component Value Date   HGBA1C 7.1* 04/20/2016   HGBA1C 5.9* 01/12/2016   HGBA1C 5.6 07/09/2015   Lipid profile (most recent): Lab Results  Component Value Date   TRIG 68 10/15/2015   CHOL 135 10/15/2015    Assessment & Plan:   1. Type 2 diabetes mellitus with other circulatory complications (HCC)  -His  diabetes is  complicated by extensive  peripheral arterial disease with bilateral lower extremity amputations and patient remains at a high risk for more acute and chronic complications of diabetes which include CAD, CVA, CKD, retinopathy, and neuropathy. These are all discussed in detail with the patient.  Patient came with controlled glucose profile, and  recent A1c of 7.1% increasing from 5.6 %.   Recent labs reviewed, showing stage 2 CKD.   - I have re-counseled the patient on diet management and weight loss  by adopting  a carbohydrate restricted / protein rich  Diet.  - Suggestion is made for patient to avoid simple carbohydrates   from their diet including Cakes , Desserts, Ice Cream,  Soda (  diet and regular) , Sweet Tea , Candies,  Chips, Cookies, Artificial Sweeteners,   and "Sugar-free" Products .  This will help patient to have stable blood glucose profile and potentially avoid unintended  Weight gain.  - Patient is advised to stick to a routine mealtimes to eat 3 meals  a day and avoid unnecessary snacks ( to snack only to correct hypoglycemia).  - The patient  has been  scheduled with Jearld Fenton, RDN, CDE for individualized DM education.  - I have approached patient with the following individualized plan to manage diabetes and patient agrees. -  He has tolerated metformin and has favorable renal function. - I will hold  Lantus for now. - Continue Byetta 10 mcg sq BID.  -I will continue metformin 500 mg by mouth twice a day.  - By next visit, his A1c increased to above 7.5%, he will be considered for basal insulin again. - Patient specific target  for A1c; LDL, HDL, Triglycerides, and  Waist Circumference were discussed in detail.  2) BP/HTN: Controlled. Continue current medications including ACEI/ARB. 3) Lipids/HPL:  continue statins. 4)  Weight/Diet: CDE consult in progress, exercise, and carbohydrates information provided.  5) Chronic Care/Health Maintenance:  -Patient is on ACEI/ARB and Statin medications  and encouraged to continue to follow up with Ophthalmology, Podiatrist at least yearly or according to recommendations, and advised to  stay away from smoking. I have recommended yearly flu vaccine and pneumonia vaccination at least every 5 years; moderate intensity exercise for up to 150 minutes weekly; and  sleep for at least 7 hours a day.  - 25 minutes of time was spent on the care of this patient , 50% of which was applied for counseling on diabetes complications and their preventions.  - I advised patient to maintain close follow up with Vic Blackbird, MD for primary care needs.  Patient is asked to bring meter and  blood glucose logs during their next visit.   Follow up plan: -Return in about 3 months (around 07/28/2016) for follow up with pre-visit labs.  Glade Lloyd, MD Phone: [phone]  Fax: [phone]   04/27/2016, 11:33 AM

## 2016-04-27 NOTE — Patient Instructions (Signed)

## 2016-05-03 ENCOUNTER — Other Ambulatory Visit: Payer: Self-pay

## 2016-05-03 MED ORDER — INSULIN PEN NEEDLE 31G X 5 MM MISC: Status: DC

## 2016-05-08 ENCOUNTER — Other Ambulatory Visit: Payer: Self-pay

## 2016-05-08 MED ORDER — METFORMIN HCL 500 MG PO TABS: 500.0000 mg | ORAL_TABLET | Freq: Two times a day (BID) | ORAL | Status: DC

## 2016-05-15 ENCOUNTER — Other Ambulatory Visit: Payer: Self-pay

## 2016-05-15 MED ORDER — METFORMIN HCL 500 MG PO TABS: 500.0000 mg | ORAL_TABLET | Freq: Two times a day (BID) | ORAL | Status: DC

## 2016-05-22 ENCOUNTER — Encounter
Payer: BLUE CROSS/BLUE SHIELD | Attending: Physical Medicine & Rehabilitation | Admitting: Physical Medicine & Rehabilitation

## 2016-05-22 ENCOUNTER — Encounter: Payer: Self-pay | Admitting: Physical Medicine & Rehabilitation

## 2016-05-22 VITALS — BP 106/72 | HR 59

## 2016-05-22 DIAGNOSIS — M623 Immobility syndrome (paraplegic): Secondary | ICD-10-CM | POA: Insufficient documentation

## 2016-05-22 DIAGNOSIS — Z89511 Acquired absence of right leg below knee: Secondary | ICD-10-CM

## 2016-05-22 DIAGNOSIS — E119 Type 2 diabetes mellitus without complications: Secondary | ICD-10-CM | POA: Insufficient documentation

## 2016-05-22 DIAGNOSIS — M7989 Other specified soft tissue disorders: Secondary | ICD-10-CM | POA: Insufficient documentation

## 2016-05-22 DIAGNOSIS — M719 Bursopathy, unspecified: Secondary | ICD-10-CM

## 2016-05-22 NOTE — Progress Notes (Signed)
Subjective:    Patient ID: [name], male    DOB: Jul 25, 1951, 66 y.o.   MRN: [id number]  HPI   [name] is here in follow up of his amputations. He developed a area of swelling on his right distal BK stump on Saturday morning. It started out the size of a baseball---now its closer to the size of a golf ball.   He wears both prosthetics. He has noticed that he's requiring more and more socks for the right BK. He is wearing 8-10 ply socks currently.   Pain Inventory Average Pain 0 Pain Right Now 0 My pain is na  In the last 24 hours, has pain interfered with the following? General activity 0 Relation with others 0 Enjoyment of life 0 What TIME of day is your pain at its worst? na Sleep (in general) Good  Pain is worse with: na Pain improves with: na Relief from Meds: na  Mobility use a walker ability to climb steps?  yes use a wheelchair needs help with transfers  Function employed # of hrs/week . 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  no  Physicians involved in your care Any changes since last visit?  no   Family History  Problem Relation Age of Onset  . Diabetes Mother   . Hypertension Mother   . Cancer Father   . Cancer Sister   . Sickle cell anemia Daughter    Social History   Social History  . Marital status: Married    Spouse name: N/A  . Number of children: 2  . Years of education: college   Occupational History  . [employer]   Social History Main Topics  . Smoking status: Never Smoker  . Smokeless tobacco: Never Used  . Alcohol use No  . Drug use: No  . Sexual activity: Not Asked   Other Topics Concern  . None   Social History Narrative   Patient lives with his wife    Patient right handed   Patient drinks caffine on occ.   Past Surgical History:  Procedure  Laterality Date  . ABOVE KNEE LEG AMPUTATION Left 2004  . AMPUTATION Right 04/28/2015   Procedure: AMPUTATION RAY, RIGHT 5TH TOE;  Surgeon: Marybelle Killings, MD;  Location: [place];  Service: Orthopedics;  Laterality: Right;  . AMPUTATION Right 05/10/2015   Procedure: Right Below Knee Amputation;  Surgeon: Marybelle Killings, MD;  Location: [place];  Service: Orthopedics;  Laterality: Right;  . CATARACT EXTRACTION W/PHACO  09/05/2012   Procedure: CATARACT EXTRACTION PHACO AND INTRAOCULAR LENS PLACEMENT (IOC);  Surgeon: Tonny Branch, MD;  Location[facility];  Service: Ophthalmology;  Laterality: Left;  CDE=5.45  . CATARACT EXTRACTION W/PHACO  10/03/2012   Procedure: CATARACT EXTRACTION PHACO AND INTRAOCULAR LENS PLACEMENT (IOC);  Surgeon: Tonny Branch, MD;  Location: [facility];  Service: Ophthalmology;  Laterality: Right;  CDE: 12.31  . COLONOSCOPY     Past Medical History:  Diagnosis Date  . Arthritis   . Cerebrovascular disease    MRI shows Right carotid inferior cavernours narrowing 75% and  50-75% stenosis of Cavernous and supraclinoi right side  . CKD (chronic kidney disease) 06/28/2014   Sees Dr Florene Glen  . Hyperlipidemia   . Hypertension   . Myocardial infarction ([place])    "mild" heart attack  .  Necrosis ([place])    #2 nail   . Pneumonia    as a child  . Poor circulation of extremity ([place])   . Sleep apnea    uses cpap, getting a new one  . Type 2 Diabetes mellitus    Type 2   There were no vitals taken for this visit.  Opioid Risk Score:   Fall Risk Score:  `1  Depression screen PHQ 2/9  Depression screen [facility] 2/9 04/27/2016 01/19/2016 10/15/2015 07/19/2015 05/26/2015 05/15/2014  Decreased Interest 0 0 0 0 0 0  Down, Depressed, Hopeless 0 0 0 0 - 0  PHQ - 2 Score 0 0 0 0 0 0  Altered sleeping - - - 0 - -  Tired, decreased energy - - - 0 - -  Change in appetite - - - 0 - -  Feeling bad or failure about yourself  - - - 0 - -  Trouble concentrating - - - 0 - -  Moving slowly or fidgety/restless - - - 0 -  -  Suicidal thoughts - - - 0 - -  PHQ-9 Score - - - 0 - -    Review of Systems  Constitutional: Negative.   HENT: Negative.   Eyes: Negative.   Respiratory: Negative.   Cardiovascular: Negative.   Gastrointestinal: Negative.   Endocrine: Negative.   Genitourinary: Negative.   Musculoskeletal: Negative.   Skin: Negative.   Allergic/Immunologic: Negative.   Neurological: Negative.   Hematological: Negative.   Psychiatric/Behavioral: Negative.        Objective:   Physical Exam     HEENT:  Head: Normocephalic and atraumatic.  Mouth/Throat: Oropharynx is clear and moist. No oropharyngeal exudate.  Eyes: Conjunctivae are normal. Pupils are equal, round, and reactive to light.  Neck: Normal range of motion. Neck supple. No JVD present. No tracheal deviation present. No thyromegaly present.  Cardiovascular: Normal rate, regular rhythm  Respiratory: Effort normal and breath sounds normal.  GI: Soft.  Musculoskeletal:RIGHT bka fleshy. Has golf ball -sized fluid fill bursa along tibial crest. Distal ends of tibia are palpable and slightly tender to touch--somewhat irregular in feel. There is no warmth, redness in bursa. Neurological: He is alert and oriented to person, place, and time. He displays normal reflexes. No cranial nerve deficit. He exhibits normal muscle tone. Coordination normal.  Pleasant and appropriate. Speech clear. Able to follow commands without difficulty. UE 5/5 delt,bicep, tircep, HI. RLE: 5/5 HF, 3+ to 5/5 KE. LLE: 5/5 HF. Sensation grossly intact.  Skin: Skin is warm and dry.  Psychiatric: He has a normal mood and affect. His behavior is normal. Judgment and thought content normal.     Assessment/Plan:  1. Functional deficits (impaired mobility/self-care) secondary to s/p right BKA (hx of left AKA)-- 2. Pain Management: off meds. Doing well  4. Mood: pt staying up beat.  5. Skin/Wound Care:  Adventitious bursa distal right tibia due to poor  fitting of socket most likely  -recommended continued cessation of right bk prosthesis until bursa resolved---even then he needs to be very careful  -socket replacement ordered per [facility]  -ice/compression  -may need to consider revision of distal tibia if area remains problematic- 6. DM type 2: improved control    . Fifteen minutes of face to face patient care time were spent during this visit. All questions were encouraged and answered. Follow up in two months.

## 2016-05-22 NOTE — Patient Instructions (Signed)
AVOID USING YOUR RIGHT BELOW KNEE PROSTHESIS UNTIL THE SWELLING HAS COMPLETELY RESOLVED  ICE   COMPRESSION  YOU NEED  A NEW SOCKET.   PLEASE CALL ME WITH ANY PROBLEMS OR QUESTIONS [phone])

## 2016-06-19 ENCOUNTER — Encounter: Payer: Self-pay | Admitting: Family Medicine

## 2016-06-19 ENCOUNTER — Ambulatory Visit (INDEPENDENT_AMBULATORY_CARE_PROVIDER_SITE_OTHER): Payer: BLUE CROSS/BLUE SHIELD | Admitting: Family Medicine

## 2016-06-19 ENCOUNTER — Ambulatory Visit: Payer: BLUE CROSS/BLUE SHIELD | Admitting: Family Medicine

## 2016-06-19 VITALS — BP 112/70 | HR 82 | Temp 97.6°F | Resp 16

## 2016-06-19 DIAGNOSIS — Z23 Encounter for immunization: Secondary | ICD-10-CM

## 2016-06-19 DIAGNOSIS — Z89511 Acquired absence of right leg below knee: Secondary | ICD-10-CM | POA: Diagnosis not present

## 2016-06-19 DIAGNOSIS — I10 Essential (primary) hypertension: Secondary | ICD-10-CM

## 2016-06-19 DIAGNOSIS — N184 Chronic kidney disease, stage 4 (severe): Secondary | ICD-10-CM

## 2016-06-19 DIAGNOSIS — Z418 Encounter for other procedures for purposes other than remedying health state: Secondary | ICD-10-CM

## 2016-06-19 DIAGNOSIS — E1159 Type 2 diabetes mellitus with other circulatory complications: Secondary | ICD-10-CM

## 2016-06-19 DIAGNOSIS — E785 Hyperlipidemia, unspecified: Secondary | ICD-10-CM

## 2016-06-19 DIAGNOSIS — Z299 Encounter for prophylactic measures, unspecified: Secondary | ICD-10-CM

## 2016-06-19 DIAGNOSIS — Z89612 Acquired absence of left leg above knee: Secondary | ICD-10-CM

## 2016-06-19 NOTE — Assessment & Plan Note (Signed)
Followed by endocrinology he's doing daily well with regard to his diabetes. Plan to have his lipids checked with his next labs 3 months

## 2016-06-19 NOTE — Assessment & Plan Note (Addendum)
Status post bilateral amputation he would benefit from a working wheelchair. He is currently not able to wear his prosthesis readily because of complications on his stump. Also he does not have the stamina to use his prosthesis throughout the day and requires help from his wife. He does have the upper body strength for manual wheelchair

## 2016-06-19 NOTE — Patient Instructions (Addendum)
Cholesterol to be done with Dr. Liliane Channel labs Shingles vaccine and TDAP given COntinue current medications F/U 6 months for Physical

## 2016-06-19 NOTE — Progress Notes (Signed)
   Subjective:    Patient ID: [name], male    DOB: September 24, 1951, 65 y.o.   MRN: [id number]  Patient presents for Follow-up (4 month- is not fasting)  Patient follow-up chronic medical problems. He is still followed by nephrology for his chronic kidney disease- nephrosclerosis- per notes no ACE/CARB to be used   He has diabetes mellitus followed by endocrinology his last A1c was 7.1% He's had multiple complications from his diabetes including amputation of his legs. Followed by Dr. Naaman Plummer last note reviewed. He had a bursa site that appeared on his right stump this was evaluated by his orthopedic surgeon who did drain out some of the fluid. He is in need of a new manual wheelchair  Hypertension taking medication as prescribed, on crestor for lipids   Review Of Systems:  GEN- denies fatigue, fever, weight loss,weakness, recent illness HEENT- denies eye drainage, change in vision, nasal discharge, CVS- denies chest pain, palpitations RESP- denies SOB, cough, wheeze ABD- denies N/V, change in stools, abd pain GU- denies dysuria, hematuria, dribbling, incontinence MSK- denies joint pain, muscle aches, injury Neuro- denies headache, dizziness, syncope, seizure activity       Objective:    BP 112/70 (BP Location: Right Arm, Patient Position: Sitting, Cuff Size: Large)   Pulse 82   Temp 97.6 F (36.4 C) (Oral)   Resp 16  GEN- NAD, alert and oriented x3 HEENT- PERRL, EOMI, non injected sclera, pink conjunctiva, MMM, oropharynx clear CVS- RRR, no murmur RESP-CTAB ABD-NABS,soft,NT,ND Ext- Right BKA Stump small bursa sack NT no drainage  Pulses- Radial  2+        Assessment & Plan:     He is overdue for shingles vaccine and tetanus booster Problem List Items Addressed This Visit    Type 2 diabetes mellitus with other circulatory complications (HCC) - Primary    Followed by endocrinology he's doing daily well with regard to his diabetes. Plan to have his lipids checked with his  next labs 3 months      Status post below knee amputation of right lower extremity ([place])    Status post bilateral amputation he would benefit from a working wheelchair. He is currently not able to wear his prosthesis readily because of complications on his stump. Also he does not have the stamina to use his prosthesis throughout the day and requires help from his wife. He does have the upper body strength for manual wheelchair      Hyperlipidemia    Cholesterol has been at goal continue Crestor      Relevant Orders   Lipid panel   History of left above knee amputation (HCC)   Essential hypertension    Well-controlled change medication      CKD (chronic kidney disease) (Chronic)    Other Visit Diagnoses    HLD (hyperlipidemia)       Need for prophylactic measure       Relevant Orders   Varicella-zoster vaccine subcutaneous (Completed)   Tdap vaccine greater than or equal to 7yo IM (Completed)      Note: This dictation was prepared with Dragon dictation along with smaller phrase technology. Any transcriptional errors that result from this process are unintentional.

## 2016-06-19 NOTE — Assessment & Plan Note (Signed)
Cholesterol has been at goal continue Crestor

## 2016-06-19 NOTE — Assessment & Plan Note (Signed)
Well-controlled change medication

## 2016-07-05 ENCOUNTER — Other Ambulatory Visit: Payer: Self-pay | Admitting: Family Medicine

## 2016-07-06 ENCOUNTER — Telehealth: Payer: Self-pay

## 2016-07-06 NOTE — Telephone Encounter (Signed)
Pt would like an rx faxed for a new socket for to his prosthesis not fitting due to his weight loss. Please advise.

## 2016-07-07 NOTE — Telephone Encounter (Signed)
Order written

## 2016-07-12 ENCOUNTER — Telehealth: Payer: Self-pay | Admitting: Physical Medicine & Rehabilitation

## 2016-07-12 NOTE — Telephone Encounter (Signed)
This has already been done. Please see past correspondences

## 2016-07-12 NOTE — Telephone Encounter (Signed)
Patient needs a prescription for a new socket for his prosthesis sent to [facility].  Any questions please call patient at [phone].

## 2016-07-12 NOTE — Telephone Encounter (Signed)
The original prescription was sent to [facility].  The patient is wanting it to come from [facility].  I gave the original prescription back to [facility].

## 2016-07-18 ENCOUNTER — Encounter: Payer: Self-pay | Admitting: Physical Medicine & Rehabilitation

## 2016-07-18 ENCOUNTER — Encounter
Payer: BLUE CROSS/BLUE SHIELD | Attending: Physical Medicine & Rehabilitation | Admitting: Physical Medicine & Rehabilitation

## 2016-07-18 ENCOUNTER — Other Ambulatory Visit: Payer: Self-pay | Admitting: "Endocrinology

## 2016-07-18 VITALS — BP 115/73 | HR 71 | Resp 16

## 2016-07-18 DIAGNOSIS — M623 Immobility syndrome (paraplegic): Secondary | ICD-10-CM | POA: Diagnosis present

## 2016-07-18 DIAGNOSIS — E119 Type 2 diabetes mellitus without complications: Secondary | ICD-10-CM | POA: Diagnosis not present

## 2016-07-18 DIAGNOSIS — M7989 Other specified soft tissue disorders: Secondary | ICD-10-CM | POA: Insufficient documentation

## 2016-07-18 DIAGNOSIS — Z5189 Encounter for other specified aftercare: Secondary | ICD-10-CM | POA: Diagnosis not present

## 2016-07-18 DIAGNOSIS — Z89511 Acquired absence of right leg below knee: Secondary | ICD-10-CM | POA: Insufficient documentation

## 2016-07-18 LAB — COMPLETE METABOLIC PANEL WITH GFR
ALBUMIN: 3.6 g/dL (ref 3.6–5.1)
ALK PHOS: 69 U/L (ref 40–115)
ALT: 13 U/L (ref 9–46)
AST: 15 U/L (ref 10–35)
BILIRUBIN TOTAL: 0.4 mg/dL (ref 0.2–1.2)
BUN: 27 mg/dL — AB (ref 7–25)
CALCIUM: 9.1 mg/dL (ref 8.6–10.3)
CO2: 24 mmol/L (ref 20–31)
CREATININE: 1.55 mg/dL — AB (ref 0.70–1.25)
Chloride: 104 mmol/L (ref 98–110)
GFR, Est African American: 54 mL/min — ABNORMAL LOW (ref >=60)
GFR, Est Non African American: 47 mL/min — ABNORMAL LOW (ref >=60)
Glucose, Bld: 130 mg/dL — ABNORMAL HIGH (ref 65–99)
POTASSIUM: 4.3 mmol/L (ref 3.5–5.3)
Sodium: 140 mmol/L (ref 135–146)
TOTAL PROTEIN: 6.4 g/dL (ref 6.1–8.1)

## 2016-07-18 NOTE — Patient Instructions (Signed)
PLEASE CALL ME WITH ANY PROBLEMS OR QUESTIONS ([phone])  

## 2016-07-18 NOTE — Progress Notes (Signed)
Subjective:    Patient ID: [name], male    DOB: November 19, 1950, 65 y.o.   MRN: [id number]  HPI  Pain Inventory Average Pain 0 Pain Right Now 0 My pain is no pain  In the last 24 hours, has pain interfered with the following? General activity 0 Relation with others 0 Enjoyment of life 0 What TIME of day is your pain at its worst? no pain Sleep (in general) Good  Pain is worse with: no pain Pain improves with: no pain Relief from Meds: no pain  Mobility walk without assistance ability to climb steps?  yes do you drive?  no  Function disabled: date disabled .  Neuro/Psych No problems in this area  Prior Studies Any changes since last visit?  no  Physicians involved in your care Any changes since last visit?  no   Family History  Problem Relation Age of Onset  . Diabetes Mother   . Hypertension Mother   . Cancer Father   . Cancer Sister   . Sickle cell anemia Daughter    Social History   Social History  . Marital status: Married    Spouse name: N/A  . Number of children: 2  . Years of education: college   Occupational History  . [employer]   Social History Main Topics  . Smoking status: Never Smoker  . Smokeless tobacco: Never Used  . Alcohol use No  . Drug use: No  . Sexual activity: Not Asked   Other Topics Concern  . None   Social History Narrative   Patient lives with his wife    Patient right handed   Patient drinks caffine on occ.   Past Surgical History:  Procedure Laterality Date  . ABOVE KNEE LEG AMPUTATION Left 2004  . AMPUTATION Right 04/28/2015   Procedure: AMPUTATION RAY, RIGHT 5TH TOE;  Surgeon: Marybelle Killings, MD;  Location: [facility];  Service: Orthopedics;  Laterality: Right;  . AMPUTATION Right 05/10/2015   Procedure: Right Below Knee Amputation;  Surgeon: Marybelle Killings, MD;  Location: [facility];  Service: Orthopedics;  Laterality: Right;  . CATARACT EXTRACTION W/PHACO  09/05/2012   Procedure: CATARACT  EXTRACTION PHACO AND INTRAOCULAR LENS PLACEMENT (IOC);  Surgeon: Tonny Branch, MD;  Location: [facility];  Service: Ophthalmology;  Laterality: Left;  CDE=5.45  . CATARACT EXTRACTION W/PHACO  10/03/2012   Procedure: CATARACT EXTRACTION PHACO AND INTRAOCULAR LENS PLACEMENT (IOC);  Surgeon: Tonny Branch, MD;  Location: [facility];  Service: Ophthalmology;  Laterality: Right;  CDE: 12.31  . COLONOSCOPY     Past Medical History:  Diagnosis Date  . Arthritis   . Cerebrovascular disease    MRI shows Right carotid inferior cavernours narrowing 75% and  50-75% stenosis of Cavernous and supraclinoi right side  . CKD (chronic kidney disease) 06/28/2014   Sees Dr Florene Glen  . Hyperlipidemia   . Hypertension   . Myocardial infarction ([place])    "mild" heart attack  . Necrosis ([place])    #2 nail   . Pneumonia    as a child  . Poor circulation of extremity ([place])   . Sleep apnea    uses cpap, getting a new one  . Type 2 Diabetes mellitus    Type 2   BP 115/73   Pulse 71   Resp 16   SpO2 97%   Opioid Risk Score:   Fall Risk Score:  `1  Depression screen PHQ 2/9  Depression screen PHQ  2/9 07/18/2016 04/27/2016 01/19/2016 10/15/2015 07/19/2015 05/26/2015 05/15/2014  Decreased Interest 0 0 0 0 0 0 0  Down, Depressed, Hopeless 0 0 0 0 0 - 0  PHQ - 2 Score 0 0 0 0 0 0 0  Altered sleeping - - - - 0 - -  Tired, decreased energy - - - - 0 - -  Change in appetite - - - - 0 - -  Feeling bad or failure about yourself  - - - - 0 - -  Trouble concentrating - - - - 0 - -  Moving slowly or fidgety/restless - - - - 0 - -  Suicidal thoughts - - - - 0 - -  PHQ-9 Score - - - - 0 - -   Review of Systems  All other systems reviewed and are negative.      Objective:   Physical Exam   HEENT:  Head: Normocephalic and atraumatic.  Mouth/Throat: Oropharynx is clear and moist. No oropharyngeal exudate.  Eyes: Conjunctivae are normal. Pupils are equal, round, and reactive to light.  Neck: Normal range of motion. Neck  supple. No JVD present. No tracheal deviation present. No thyromegaly present.  Cardiovascular: Normal rate, regular rhythm  Respiratory: Effort normal and breath sounds normal.  GI: Soft.  Musculoskeletal:RIGHT bka fleshy. Has golf ball -sized fluid fill bursa along tibial crest. Distal ends of tibia are palpable and slightly tender to touch--somewhat irregular in feel. There is no warmth, redness in bursa. Neurological: He is alert and oriented to person, place, and time. He displays normal reflexes. No cranial nerve deficit. He exhibits normal muscle tone. Coordination normal.  Pleasant and appropriate. Speech clear. Able to follow commands without difficulty. UE 5/5 delt,bicep, tircep, HI. RLE: 5/5 HF, 3+ to 5/5 KE. LLE: 5/5 HF. Sensation grossly intact.  Skin: Skin is warm and dry.  Psychiatric: He has a normal mood and affect. His behavior is normal. Judgment and thought content normal.     Assessment/Plan:  1. Functional deficits (impaired mobility/self-care) secondary to s/p right BKA (hx of left AKA)-- 2. Pain Management: off meds. Doing well  4. Mood: pt staying up beat.  5. Skin/Wound Care:  Adventitious bursa distal right tibia due to poor fitting of socket most likely  -improved today--socket seems to be fitting appropriately             -continue careful observation and socket adjustment per [employer]             -ice/compression             -may need to consider revision of distal tibia if area problematic in the future      . Fifteen minutes of face to face patient care time were spent during this visit. All questions were encouraged and answered. Follow up in two months.

## 2016-07-19 LAB — HEMOGLOBIN A1C
HEMOGLOBIN A1C: 7.8 % — AB (ref <5.7)
Mean Plasma Glucose: 177 mg/dL

## 2016-07-27 ENCOUNTER — Encounter: Payer: Self-pay | Admitting: "Endocrinology

## 2016-07-27 ENCOUNTER — Ambulatory Visit (INDEPENDENT_AMBULATORY_CARE_PROVIDER_SITE_OTHER): Payer: BLUE CROSS/BLUE SHIELD | Admitting: "Endocrinology

## 2016-07-27 VITALS — BP 131/81 | HR 73 | Ht 70.0 in | Wt 234.0 lb

## 2016-07-27 DIAGNOSIS — E785 Hyperlipidemia, unspecified: Secondary | ICD-10-CM

## 2016-07-27 DIAGNOSIS — E1159 Type 2 diabetes mellitus with other circulatory complications: Secondary | ICD-10-CM

## 2016-07-27 DIAGNOSIS — I10 Essential (primary) hypertension: Secondary | ICD-10-CM

## 2016-07-27 MED ORDER — INSULIN GLARGINE 300 UNIT/ML ~~LOC~~ SOPN: 20.0000 [IU] | PEN_INJECTOR | Freq: Every day | SUBCUTANEOUS | 2 refills | Status: DC

## 2016-07-27 NOTE — Progress Notes (Signed)
Subjective:    Patient ID: [name], male    DOB: Aug 16, 1951, PCP Vic Blackbird, MD   Past Medical History:  Diagnosis Date  . Arthritis   . Cerebrovascular disease    MRI shows Right carotid inferior cavernours narrowing 75% and  50-75% stenosis of Cavernous and supraclinoi right side  . CKD (chronic kidney disease) 06/28/2014   Sees Dr Florene Glen  . Hyperlipidemia   . Hypertension   . Myocardial infarction ([place])    "mild" heart attack  . Necrosis ([place])    #2 nail   . Pneumonia    as a child  . Poor circulation of extremity ([place])   . Sleep apnea    uses cpap, getting a new one  . Type 2 Diabetes mellitus    Type 2   Past Surgical History:  Procedure Laterality Date  . ABOVE KNEE LEG AMPUTATION Left 2004  . AMPUTATION Right 04/28/2015   Procedure: AMPUTATION RAY, RIGHT 5TH TOE;  Surgeon: Marybelle Killings, MD;  Location: [facility];  Service: Orthopedics;  Laterality: Right;  . AMPUTATION Right 05/10/2015   Procedure: Right Below Knee Amputation;  Surgeon: Marybelle Killings, MD;  Location: [facility];  Service: Orthopedics;  Laterality: Right;  . CATARACT EXTRACTION W/PHACO  09/05/2012   Procedure: CATARACT EXTRACTION PHACO AND INTRAOCULAR LENS PLACEMENT (IOC);  Surgeon: Tonny Branch, MD;  Location: [facility];  Service: Ophthalmology;  Laterality: Left;  CDE=5.45  . CATARACT EXTRACTION W/PHACO  10/03/2012   Procedure: CATARACT EXTRACTION PHACO AND INTRAOCULAR LENS PLACEMENT (IOC);  Surgeon: Tonny Branch, MD;  Location: [facility];  Service: Ophthalmology;  Laterality: Right;  CDE: 12.31  . COLONOSCOPY     Social History   Social History  . Marital status: Married    Spouse name: N/A  . Number of children: 2  . Years of education: college   Occupational History  . [employer]   Social History Main Topics  . Smoking status: Never Smoker  . Smokeless tobacco: Never Used  . Alcohol use No  . Drug use: No  . Sexual activity: Not Asked   Other Topics Concern  . None    Social History Narrative   Patient lives with his wife    Patient right handed   Patient drinks caffine on occ.   Outpatient Encounter Prescriptions as of 07/27/2016  Medication Sig  . acetaminophen (TYLENOL) 325 MG tablet Take 1-2 tablets (325-650 mg total) by mouth every 4 (four) hours as needed for mild pain.  Marland Kitchen atenolol (TENORMIN) 25 MG tablet Take 1 tablet (25 mg total) by mouth daily.  . Calcium Carb-Cholecalciferol (CALCIUM PLUS VITAMIN D3) 600-500 MG-UNIT CAPS Take 1 capsule by mouth daily.   . clopidogrel (PLAVIX) 75 MG tablet Take 1 tablet (75 mg total) by mouth daily.  Marland Kitchen exenatide (BYETTA 10 MCG PEN) 10 MCG/0.04ML SOPN injection INJECT 0.04 MLS (10 MCG TOTAL) INTO THE SKIN 2 (TWO) TIMES DAILY WITH A MEAL.  . furosemide (LASIX) 40 MG tablet Take 1 tablet (40 mg total) by mouth daily.  . Insulin Glargine (TOUJEO SOLOSTAR) 300 UNIT/ML SOPN Inject 20 Units into the skin at bedtime.  . Insulin Pen Needle (B-D UF III MINI PEN NEEDLES) 31G X 5 MM MISC 4 x daily  . metFORMIN (GLUCOPHAGE) 500 MG tablet Take 1 tablet (500 mg total) by mouth 2 (two) times daily with a meal.  . rosuvastatin (CRESTOR) 20 MG tablet take 1 tablet by mouth once daily  No facility-administered encounter medications on file as of 07/27/2016.    ALLERGIES: Allergies  Allergen Reactions  . Zolpidem Tartrate Other (See Comments)    disorientation    VACCINATION STATUS: Immunization History  Administered Date(s) Administered  . Influenza Split 06/30/2013  . Influenza-Unspecified 07/30/2015  . Pneumococcal Polysaccharide-23 07/31/2007  . Td 10/30/2002  . Tdap 06/19/2016  . Zoster 06/19/2016    Diabetes  He presents for his follow-up diabetic visit. He has type 2 diabetes mellitus. Onset time: He was diagnosed at approximate age of 28 years. His disease course has been worsening. There are no hypoglycemic associated symptoms. Pertinent negatives for hypoglycemia include no confusion, headaches, pallor or  seizures. There are no diabetic associated symptoms. Pertinent negatives for diabetes include no chest pain, no fatigue, no polydipsia, no polyphagia, no polyuria and no weakness. There are no hypoglycemic complications. Symptoms are worsening. Diabetic complications include PVD. (Status post bilateral lower extremity amputations. He is wheelchair-bound, awaiting for leg prosthetics.) Risk factors for coronary artery disease include diabetes mellitus, dyslipidemia, hypertension, male sex, obesity, sedentary lifestyle and tobacco exposure. Current diabetic treatment includes insulin injections (Byetta 10 g subcutaneous twice a day.). He is compliant with treatment most of the time. His weight is decreasing steadily (Overall he lost 75 pounds since 2012, including his amputations.). He is following a diabetic diet. When asked about meal planning, he reported none. He has had a previous visit with a dietitian. He never participates in exercise. His home blood glucose trend is decreasing steadily. An ACE inhibitor/angiotensin II receptor blocker is being taken.  Hyperlipidemia  This is a chronic problem. The current episode started more than 1 year ago. The problem is controlled. Pertinent negatives include no chest pain, myalgias or shortness of breath. Current antihyperlipidemic treatment includes statins. Risk factors for coronary artery disease include dyslipidemia, diabetes mellitus, hypertension, male sex, obesity and a sedentary lifestyle.  Hypertension  This is a chronic problem. The current episode started more than 1 year ago. Pertinent negatives include no chest pain, headaches, neck pain, palpitations or shortness of breath. Risk factors for coronary artery disease include diabetes mellitus, dyslipidemia, male gender, obesity, sedentary lifestyle and smoking/tobacco exposure. Hypertensive end-organ damage includes PVD.     Review of Systems  Constitutional: Negative for fatigue and unexpected  weight change.  HENT: Negative for dental problem, mouth sores and trouble swallowing.   Eyes: Negative for visual disturbance.  Respiratory: Negative for cough, choking, chest tightness, shortness of breath and wheezing.   Cardiovascular: Negative for chest pain, palpitations and leg swelling.  Gastrointestinal: Negative for abdominal distention, abdominal pain, constipation, diarrhea, nausea and vomiting.  Endocrine: Negative for polydipsia, polyphagia and polyuria.  Genitourinary: Negative for dysuria, flank pain, hematuria and urgency.  Musculoskeletal: Positive for gait problem. Negative for back pain, myalgias and neck pain.       He now walks with bilateral leg prostheses and crutches.  Skin: Negative for pallor, rash and wound.  Neurological: Negative for seizures, syncope, weakness, numbness and headaches.  Psychiatric/Behavioral: Negative.  Negative for confusion, dysphoric mood and suicidal ideas.    Objective:    BP 131/81   Pulse 73   Ht 5\' 10"  (1.778 m)   Wt 234 lb (106.1 kg)   BMI 33.58 kg/m   Wt Readings from Last 3 Encounters:  07/27/16 234 lb (106.1 kg)  04/27/16 239 lb (108.4 kg)  05/19/15 212 lb 0.3 oz (96.2 kg)    Physical Exam  Constitutional: He is oriented to person, place,  and time. He appears well-developed and well-nourished. He is cooperative. No distress.  HENT:  Head: Normocephalic and atraumatic.  Eyes: EOM are normal.  Neck: Normal range of motion. Neck supple. No tracheal deviation present. No thyromegaly present.  Cardiovascular: Normal rate, S1 normal, S2 normal and normal heart sounds.  Exam reveals no gallop.   No murmur heard. Pulses:      Dorsalis pedis pulses are 1+ on the right side, and 1+ on the left side.       Posterior tibial pulses are 1+ on the right side, and 1+ on the left side.  Pulmonary/Chest: Breath sounds normal. No respiratory distress. He has no wheezes.  Abdominal: Soft. Bowel sounds are normal. He exhibits no  distension. There is no tenderness. There is no guarding and no CVA tenderness.  Musculoskeletal:       Right shoulder: He exhibits no swelling and no deformity.  He now walks with bilateral leg prostheses and crutches.  Neurological: He is alert and oriented to person, place, and time. He has normal strength and normal reflexes. No cranial nerve deficit or sensory deficit. Gait normal.  Skin: Skin is warm and dry. No rash noted. No cyanosis. Nails show no clubbing.  Psychiatric: He has a normal mood and affect. His speech is normal and behavior is normal. Judgment and thought content normal. Cognition and memory are normal.    Results for orders placed or performed in visit on 07/18/16  COMPLETE METABOLIC PANEL WITH GFR  Result Value Ref Range   Sodium 140 135 - 146 mmol/L   Potassium 4.3 3.5 - 5.3 mmol/L   Chloride 104 98 - 110 mmol/L   CO2 24 20 - 31 mmol/L   Glucose, Bld 130 (H) 65 - 99 mg/dL   BUN 27 (H) 7 - 25 mg/dL   Creat 1.55 (H) 0.70 - 1.25 mg/dL   Total Bilirubin 0.4 0.2 - 1.2 mg/dL   Alkaline Phosphatase 69 40 - 115 U/L   AST 15 10 - 35 U/L   ALT 13 9 - 46 U/L   Total Protein 6.4 6.1 - 8.1 g/dL   Albumin 3.6 3.6 - 5.1 g/dL   Calcium 9.1 8.6 - 10.3 mg/dL   GFR, Est African American 54 (L) >=60 mL/min   GFR, Est Non African American 47 (L) >=60 mL/min  Hemoglobin A1c  Result Value Ref Range   Hgb A1c MFr Bld 7.8 (H) <5.7 %   Mean Plasma Glucose 177 mg/dL   Complete Blood Count (Most recent): Lab Results  Component Value Date   WBC 7.6 05/20/2015   HGB 8.3 (L) 05/20/2015   HCT 25.4 (L) 05/20/2015   MCV 87.3 05/20/2015   PLT 380 05/20/2015   Chemistry (most recent): Lab Results  Component Value Date   NA 140 07/18/2016   K 4.3 07/18/2016   CL 104 07/18/2016   CO2 24 07/18/2016   BUN 27 (H) 07/18/2016   CREATININE 1.55 (H) 07/18/2016   Diabetic Labs (most recent): Lab Results  Component Value Date   HGBA1C 7.8 (H) 07/18/2016   HGBA1C 7.1 (H) 04/20/2016    HGBA1C 5.9 (H) 01/12/2016   Lipid profile (most recent): Lab Results  Component Value Date   TRIG 68 10/15/2015   CHOL 135 10/15/2015    Assessment & Plan:   1. Type 2 diabetes mellitus with other circulatory complications (HCC)  -His  diabetes is  complicated by extensive peripheral arterial disease with bilateral lower extremity amputations and patient remains  at a high risk for more acute and chronic complications of diabetes which include CAD, CVA, CKD, retinopathy, and neuropathy. These are all discussed in detail with the patient.  Patient came with controlled glucose profile, and  recent A1c of 7.8% increasing from 7.1 %.   Recent labs reviewed, showing stage 2 CKD.   - I have re-counseled the patient on diet management and weight loss  by adopting a carbohydrate restricted / protein rich  Diet.  - Suggestion is made for patient to avoid simple carbohydrates   from their diet including Cakes , Desserts, Ice Cream,  Soda (  diet and regular) , Sweet Tea , Candies,  Chips, Cookies, Artificial Sweeteners,   and "Sugar-free" Products .  This will help patient to have stable blood glucose profile and potentially avoid unintended  Weight gain.  - Patient is advised to stick to a routine mealtimes to eat 3 meals  a day and avoid unnecessary snacks ( to snack only to correct hypoglycemia).  - The patient  has been  scheduled with Jearld Fenton, RDN, CDE for individualized DM education.  - I have approached patient with the following individualized plan to manage diabetes and patient agrees. -  He has tolerated metformin and has favorable renal function. - I will  Assume basal insulin therapy with  Toujeo 20 units daily at bedtime associated with monitoring of blood glucose every day before breakfast.  - Continue Byetta 10 mcg sq BID.  -I will continue metformin 500 mg by mouth twice a day.   - Patient specific target  for A1c; LDL, HDL, Triglycerides, and  Waist Circumference were  discussed in detail.  2) BP/HTN: Controlled. Continue current medications including ACEI/ARB. 3) Lipids/HPL:  continue statins. 4)  Weight/Diet: CDE consult in progress, exercise, and carbohydrates information provided.  5) Chronic Care/Health Maintenance:  -Patient is on ACEI/ARB and Statin medications and encouraged to continue to follow up with Ophthalmology, Podiatrist at least yearly or according to recommendations, and advised to  stay away from smoking. I have recommended yearly flu vaccine and pneumonia vaccination at least every 5 years; moderate intensity exercise for up to 150 minutes weekly; and  sleep for at least 7 hours a day.  - 25 minutes of time was spent on the care of this patient , 50% of which was applied for counseling on diabetes complications and their preventions.  - I advised patient to maintain close follow up with Vic Blackbird, MD for primary care needs.  Patient is asked to bring meter and  blood glucose logs during their next visit.   Follow up plan: -Return in about 3 months (around 10/26/2016) for meter, and logs.  Glade Lloyd, MD Phone: [phone]  Fax: [phone]   07/27/2016, 12:02 PM

## 2016-07-27 NOTE — Patient Instructions (Signed)

## 2016-07-31 ENCOUNTER — Other Ambulatory Visit: Payer: Self-pay | Admitting: "Endocrinology

## 2016-07-31 MED ORDER — METFORMIN HCL 500 MG PO TABS: 500.0000 mg | ORAL_TABLET | Freq: Two times a day (BID) | ORAL | 0 refills | Status: DC

## 2016-09-01 ENCOUNTER — Other Ambulatory Visit: Payer: Self-pay | Admitting: "Endocrinology

## 2016-09-09 ENCOUNTER — Other Ambulatory Visit: Payer: Self-pay | Admitting: Family Medicine

## 2016-09-29 ENCOUNTER — Other Ambulatory Visit: Payer: Self-pay | Admitting: Family Medicine

## 2016-10-15 ENCOUNTER — Other Ambulatory Visit: Payer: Self-pay | Admitting: Family Medicine

## 2016-10-31 ENCOUNTER — Other Ambulatory Visit: Payer: Self-pay | Admitting: "Endocrinology

## 2016-11-01 LAB — MICROALBUMIN / CREATININE URINE RATIO
CREATININE, URINE: 81 mg/dL (ref 20–370)
MICROALB UR: 0.2 mg/dL
MICROALB/CREAT RATIO: 2 ug/mg{creat} (ref <30)

## 2016-11-01 LAB — COMPREHENSIVE METABOLIC PANEL
ALT: 15 U/L (ref 9–46)
AST: 18 U/L (ref 10–35)
Albumin: 3.6 g/dL (ref 3.6–5.1)
Alkaline Phosphatase: 69 U/L (ref 40–115)
BUN: 28 mg/dL — AB (ref 7–25)
CHLORIDE: 106 mmol/L (ref 98–110)
CO2: 23 mmol/L (ref 20–31)
Calcium: 8.6 mg/dL (ref 8.6–10.3)
Creat: 1.42 mg/dL — ABNORMAL HIGH (ref 0.70–1.25)
GLUCOSE: 88 mg/dL (ref 65–99)
POTASSIUM: 4.5 mmol/L (ref 3.5–5.3)
Sodium: 141 mmol/L (ref 135–146)
Total Bilirubin: 0.4 mg/dL (ref 0.2–1.2)
Total Protein: 6.2 g/dL (ref 6.1–8.1)

## 2016-11-01 LAB — TSH: TSH: 2 m[IU]/L (ref 0.40–4.50)

## 2016-11-01 LAB — LIPID PANEL
CHOL/HDL RATIO: 2.7 ratio (ref <5.0)
CHOLESTEROL: 106 mg/dL (ref <200)
HDL: 39 mg/dL — ABNORMAL LOW (ref >40)
LDL Cholesterol: 53 mg/dL (ref <100)
Triglycerides: 71 mg/dL (ref <150)
VLDL: 14 mg/dL (ref <30)

## 2016-11-01 LAB — T4, FREE: FREE T4: 1.2 ng/dL (ref 0.8–1.8)

## 2016-11-01 LAB — HEMOGLOBIN A1C
Hgb A1c MFr Bld: 6.7 % — ABNORMAL HIGH (ref <5.7)
Mean Plasma Glucose: 146 mg/dL

## 2016-11-01 LAB — VITAMIN D 25 HYDROXY (VIT D DEFICIENCY, FRACTURES): Vit D, 25-Hydroxy: 37 ng/mL (ref 30–100)

## 2016-11-02 ENCOUNTER — Encounter: Payer: Self-pay | Admitting: "Endocrinology

## 2016-11-02 ENCOUNTER — Ambulatory Visit (INDEPENDENT_AMBULATORY_CARE_PROVIDER_SITE_OTHER): Payer: BLUE CROSS/BLUE SHIELD | Admitting: "Endocrinology

## 2016-11-02 VITALS — BP 125/72 | HR 77 | Ht 70.0 in | Wt 244.0 lb

## 2016-11-02 DIAGNOSIS — I10 Essential (primary) hypertension: Secondary | ICD-10-CM

## 2016-11-02 DIAGNOSIS — E1159 Type 2 diabetes mellitus with other circulatory complications: Secondary | ICD-10-CM

## 2016-11-02 DIAGNOSIS — E782 Mixed hyperlipidemia: Secondary | ICD-10-CM | POA: Diagnosis not present

## 2016-11-02 NOTE — Patient Instructions (Signed)

## 2016-11-02 NOTE — Progress Notes (Signed)
Subjective:    Patient ID: [name], male    DOB: Dec 08, 1950, PCP Vic Blackbird, MD   Past Medical History:  Diagnosis Date  . Arthritis   . Cerebrovascular disease    MRI shows Right carotid inferior cavernours narrowing 75% and  50-75% stenosis of Cavernous and supraclinoi right side  . CKD (chronic kidney disease) 06/28/2014   Sees Dr Florene Glen  . Hyperlipidemia   . Hypertension   . Myocardial infarction    "mild" heart attack  . Necrosis ([place])    #2 nail   . Pneumonia    as a child  . Poor circulation of extremity   . Sleep apnea    uses cpap, getting a new one  . Type 2 Diabetes mellitus    Type 2   Past Surgical History:  Procedure Laterality Date  . ABOVE KNEE LEG AMPUTATION Left 2004  . AMPUTATION Right 04/28/2015   Procedure: AMPUTATION RAY, RIGHT 5TH TOE;  Surgeon: Marybelle Killings, MD;  Location: [facility];  Service: Orthopedics;  Laterality: Right;  . AMPUTATION Right 05/10/2015   Procedure: Right Below Knee Amputation;  Surgeon: Marybelle Killings, MD;  Location: [facility];  Service: Orthopedics;  Laterality: Right;  . CATARACT EXTRACTION W/PHACO  09/05/2012   Procedure: CATARACT EXTRACTION PHACO AND INTRAOCULAR LENS PLACEMENT (IOC);  Surgeon: Tonny Branch, MD;  Location: [facility];  Service: Ophthalmology;  Laterality: Left;  CDE=5.45  . CATARACT EXTRACTION W/PHACO  10/03/2012   Procedure: CATARACT EXTRACTION PHACO AND INTRAOCULAR LENS PLACEMENT (IOC);  Surgeon: Tonny Branch, MD;  Location: [facility];  Service: Ophthalmology;  Laterality: Right;  CDE: 12.31  . COLONOSCOPY     Social History   Social History  . Marital status: Married    Spouse name: N/A  . Number of children: 2  . Years of education: college   Occupational History  . [employer]   Social History Main Topics  . Smoking status: Never Smoker  . Smokeless tobacco: Never Used  . Alcohol use No  . Drug use: No  . Sexual activity: Not Asked   Other Topics Concern  . None   Social History  Narrative   Patient lives with his wife    Patient right handed   Patient drinks caffine on occ.   Outpatient Encounter Prescriptions as of 11/02/2016  Medication Sig  . acetaminophen (TYLENOL) 325 MG tablet Take 1-2 tablets (325-650 mg total) by mouth every 4 (four) hours as needed for mild pain.  Marland Kitchen atenolol (TENORMIN) 25 MG tablet TAKE 1 TABLET (25 MG TOTAL) BY MOUTH DAILY.  Marland Kitchen BYETTA 10 MCG PEN 10 MCG/0.04ML SOPN injection inject 0.04 MLS (10 mcg) subcutaneously twice a day WITH A MEAL  . Calcium Carb-Cholecalciferol (CALCIUM PLUS VITAMIN D3) 600-500 MG-UNIT CAPS Take 1 capsule by mouth daily.   . clopidogrel (PLAVIX) 75 MG tablet take 1 tablet by mouth once daily  . furosemide (LASIX) 40 MG tablet TAKE 1 TABLET (40 MG TOTAL) BY MOUTH DAILY.  Marland Kitchen Insulin Glargine (TOUJEO SOLOSTAR) 300 UNIT/ML SOPN Inject 20 Units into the skin at bedtime.  . Insulin Pen Needle (B-D UF III MINI PEN NEEDLES) 31G X 5 MM MISC 4 x daily  . metFORMIN (GLUCOPHAGE) 500 MG tablet Take 1 tablet (500 mg total) by mouth 2 (two) times daily with a meal.  . rosuvastatin (CRESTOR) 20 MG tablet take 1 tablet by mouth once daily   No facility-administered encounter medications on file as  of 11/02/2016.    ALLERGIES: Allergies  Allergen Reactions  . Zolpidem Tartrate Other (See Comments)    disorientation    VACCINATION STATUS: Immunization History  Administered Date(s) Administered  . Influenza Split 06/30/2013  . Influenza-Unspecified 07/30/2015  . Pneumococcal Polysaccharide-23 07/31/2007  . Td 10/30/2002  . Tdap 06/19/2016  . Zoster 06/19/2016    Diabetes  He presents for his follow-up diabetic visit. He has type 2 diabetes mellitus. Onset time: He was diagnosed at approximate age of 45 years. His disease course has been improving. There are no hypoglycemic associated symptoms. Pertinent negatives for hypoglycemia include no confusion, headaches, pallor or seizures. There are no diabetic associated symptoms.  Pertinent negatives for diabetes include no chest pain, no fatigue, no polydipsia, no polyphagia, no polyuria and no weakness. There are no hypoglycemic complications. Symptoms are improving. Diabetic complications include PVD. (Status post bilateral lower extremity amputations. He is wheelchair-bound, awaiting for leg prosthetics.) Risk factors for coronary artery disease include diabetes mellitus, dyslipidemia, hypertension, male sex, obesity, sedentary lifestyle and tobacco exposure. Current diabetic treatment includes insulin injections (Byetta 10 g subcutaneous twice a day.). He is compliant with treatment most of the time. His weight is increasing steadily (Overall he lost 75 pounds since 2012, including his amputations, however recently he is regaining his weight.). He is following a diabetic diet. When asked about meal planning, he reported none. He has had a previous visit with a dietitian. He never participates in exercise. His home blood glucose trend is decreasing steadily. His overall blood glucose range is 130-140 mg/dl. An ACE inhibitor/angiotensin II receptor blocker is being taken.  Hyperlipidemia  This is a chronic problem. The current episode started more than 1 year ago. The problem is controlled. Pertinent negatives include no chest pain, myalgias or shortness of breath. Current antihyperlipidemic treatment includes statins. Risk factors for coronary artery disease include dyslipidemia, diabetes mellitus, hypertension, male sex, obesity and a sedentary lifestyle.  Hypertension  This is a chronic problem. The current episode started more than 1 year ago. Pertinent negatives include no chest pain, headaches, neck pain, palpitations or shortness of breath. Risk factors for coronary artery disease include diabetes mellitus, dyslipidemia, male gender, obesity, sedentary lifestyle and smoking/tobacco exposure. Hypertensive end-organ damage includes PVD.     Review of Systems  Constitutional:  Negative for fatigue and unexpected weight change.  HENT: Negative for dental problem, mouth sores and trouble swallowing.   Eyes: Negative for visual disturbance.  Respiratory: Negative for cough, choking, chest tightness, shortness of breath and wheezing.   Cardiovascular: Negative for chest pain, palpitations and leg swelling.  Gastrointestinal: Negative for abdominal distention, abdominal pain, constipation, diarrhea, nausea and vomiting.  Endocrine: Negative for polydipsia, polyphagia and polyuria.  Genitourinary: Negative for dysuria, flank pain, hematuria and urgency.  Musculoskeletal: Positive for gait problem. Negative for back pain, myalgias and neck pain.       He now walks with bilateral leg prostheses and crutches.  Skin: Negative for pallor, rash and wound.  Neurological: Negative for seizures, syncope, weakness, numbness and headaches.  Psychiatric/Behavioral: Negative.  Negative for confusion, dysphoric mood and suicidal ideas.    Objective:    BP 125/72   Pulse 77   Ht 5\' 10"  (1.778 m)   Wt 244 lb (110.7 kg)   BMI 35.01 kg/m   Wt Readings from Last 3 Encounters:  11/02/16 244 lb (110.7 kg)  07/27/16 234 lb (106.1 kg)  04/27/16 239 lb (108.4 kg)    Physical Exam  Constitutional:  He is oriented to person, place, and time. He appears well-developed and well-nourished. He is cooperative. No distress.  HENT:  Head: Normocephalic and atraumatic.  Eyes: EOM are normal.  Neck: Normal range of motion. Neck supple. No tracheal deviation present. No thyromegaly present.  Cardiovascular: Normal rate, S1 normal, S2 normal and normal heart sounds.  Exam reveals no gallop.   No murmur heard. Pulses:      Dorsalis pedis pulses are 1+ on the right side, and 1+ on the left side.       Posterior tibial pulses are 1+ on the right side, and 1+ on the left side.  Pulmonary/Chest: Breath sounds normal. No respiratory distress. He has no wheezes.  Abdominal: Soft. Bowel sounds are  normal. He exhibits no distension. There is no tenderness. There is no guarding and no CVA tenderness.  Musculoskeletal:       Right shoulder: He exhibits no swelling and no deformity.  He now walks with bilateral leg prostheses and crutches.  Neurological: He is alert and oriented to person, place, and time. He has normal strength and normal reflexes. No cranial nerve deficit or sensory deficit. Gait normal.  Skin: Skin is warm and dry. No rash noted. No cyanosis. Nails show no clubbing.  Psychiatric: He has a normal mood and affect. His speech is normal and behavior is normal. Judgment and thought content normal. Cognition and memory are normal.    Results for orders placed or performed in visit on 10/31/16  Microalbumin / creatinine urine ratio  Result Value Ref Range   Creatinine, Urine 81 20 - 370 mg/dL   Microalb, Ur 0.2 Not estab mg/dL   Microalb Creat Ratio 2 <30 mcg/mg creat  Comprehensive metabolic panel  Result Value Ref Range   Sodium 141 135 - 146 mmol/L   Potassium 4.5 3.5 - 5.3 mmol/L   Chloride 106 98 - 110 mmol/L   CO2 23 20 - 31 mmol/L   Glucose, Bld 88 65 - 99 mg/dL   BUN 28 (H) 7 - 25 mg/dL   Creat 1.42 (H) 0.70 - 1.25 mg/dL   Total Bilirubin 0.4 0.2 - 1.2 mg/dL   Alkaline Phosphatase 69 40 - 115 U/L   AST 18 10 - 35 U/L   ALT 15 9 - 46 U/L   Total Protein 6.2 6.1 - 8.1 g/dL   Albumin 3.6 3.6 - 5.1 g/dL   Calcium 8.6 8.6 - 10.3 mg/dL  Lipid panel  Result Value Ref Range   Cholesterol 106 <200 mg/dL   Triglycerides 71 <150 mg/dL   HDL 39 (L) >40 mg/dL   Total CHOL/HDL Ratio 2.7 <5.0 Ratio   VLDL 14 <30 mg/dL   LDL Cholesterol 53 <100 mg/dL  TSH  Result Value Ref Range   TSH 2.00 0.40 - 4.50 mIU/L  T4, free  Result Value Ref Range   Free T4 1.2 0.8 - 1.8 ng/dL  Hemoglobin A1c  Result Value Ref Range   Hgb A1c MFr Bld 6.7 (H) <5.7 %   Mean Plasma Glucose 146 mg/dL  VITAMIN D 25 Hydroxy (Vit-D Deficiency, Fractures)  Result Value Ref Range   Vit D,  25-Hydroxy 37 30 - 100 ng/mL   Complete Blood Count (Most recent): Lab Results  Component Value Date   WBC 7.6 05/20/2015   HGB 8.3 (L) 05/20/2015   HCT 25.4 (L) 05/20/2015   MCV 87.3 05/20/2015   PLT 380 05/20/2015   Chemistry (most recent): Lab Results  Component Value Date  NA 141 10/31/2016   K 4.5 10/31/2016   CL 106 10/31/2016   CO2 23 10/31/2016   BUN 28 (H) 10/31/2016   CREATININE 1.42 (H) 10/31/2016   Diabetic Labs (most recent): Lab Results  Component Value Date   HGBA1C 6.7 (H) 10/31/2016   HGBA1C 7.8 (H) 07/18/2016   HGBA1C 7.1 (H) 04/20/2016   Lipid profile (most recent): Lab Results  Component Value Date   TRIG 71 10/31/2016   CHOL 106 10/31/2016    Assessment & Plan:   1. Type 2 diabetes mellitus with other circulatory complications (HCC)  -His  diabetes is  complicated by extensive peripheral arterial disease with bilateral lower extremity amputations and patient remains at a high risk for more acute and chronic complications of diabetes which include CAD, CVA, CKD, retinopathy, and neuropathy. These are all discussed in detail with the patient.  Patient came with controlled  Fasting glucose profile, and  recent A1c of 6.7% improving from 7.8%.   Recent labs reviewed, showing stage 2 CKD.   - I have re-counseled the patient on diet management and weight loss  by adopting a carbohydrate restricted / protein rich  Diet.  - Suggestion is made for patient to avoid simple carbohydrates   from their diet including Cakes , Desserts, Ice Cream,  Soda (  diet and regular) , Sweet Tea , Candies,  Chips, Cookies, Artificial Sweeteners,   and "Sugar-free" Products .  This will help patient to have stable blood glucose profile and potentially avoid unintended  Weight gain.  - Patient is advised to stick to a routine mealtimes to eat 3 meals  a day and avoid unnecessary snacks ( to snack only to correct hypoglycemia).  - The patient  has been  scheduled with  Jearld Fenton, RDN, CDE for individualized DM education.  - I have approached patient with the following individualized plan to manage diabetes and patient agrees. - I will  Continue basal insulin   Toujeo 20 units daily at bedtime associated with monitoring of blood glucose every day before breakfast.  - Continue Byetta 10 mcg sq BID.  -I will continue metformin 500 mg by mouth twice a day.   - Patient specific target  for A1c; LDL, HDL, Triglycerides, and  Waist Circumference were discussed in detail.  2) BP/HTN: Controlled. Continue current medications including ACEI/ARB. 3) Lipids/HPL:  continue statins. 4)  Weight/Diet: CDE consult in progress, exercise, and carbohydrates information provided.  5) Chronic Care/Health Maintenance:  -Patient is on ACEI/ARB and Statin medications and encouraged to continue to follow up with Ophthalmology, Podiatrist at least yearly or according to recommendations, and advised to  stay away from smoking. I have recommended yearly flu vaccine and pneumonia vaccination at least every 5 years; moderate intensity exercise for up to 150 minutes weekly; and  sleep for at least 7 hours a day.  - 25 minutes of time was spent on the care of this patient , 50% of which was applied for counseling on diabetes complications and their preventions.  - I advised patient to maintain close follow up with Vic Blackbird, MD for primary care needs.  Patient is asked to bring meter and  blood glucose logs during their next visit.   Follow up plan: -Return in about 3 months (around 01/31/2017) for follow up with pre-visit labs, meter, and logs.  Glade Lloyd, MD Phone: [phone]  Fax: [phone]   11/02/2016, 10:45 AM

## 2016-11-13 ENCOUNTER — Other Ambulatory Visit: Payer: Self-pay | Admitting: Family Medicine

## 2016-12-14 ENCOUNTER — Other Ambulatory Visit: Payer: Self-pay | Admitting: Family Medicine

## 2017-01-03 ENCOUNTER — Encounter: Payer: Self-pay | Admitting: Family Medicine

## 2017-01-03 ENCOUNTER — Ambulatory Visit (INDEPENDENT_AMBULATORY_CARE_PROVIDER_SITE_OTHER): Payer: BLUE CROSS/BLUE SHIELD | Admitting: Family Medicine

## 2017-01-03 VITALS — BP 130/74 | HR 76 | Temp 98.1°F | Resp 14 | Ht 70.0 in | Wt 244.0 lb

## 2017-01-03 DIAGNOSIS — IMO0001 Reserved for inherently not codable concepts without codable children: Secondary | ICD-10-CM

## 2017-01-03 DIAGNOSIS — Z0000 Encounter for general adult medical examination without abnormal findings: Secondary | ICD-10-CM

## 2017-01-03 DIAGNOSIS — E6609 Other obesity due to excess calories: Secondary | ICD-10-CM | POA: Diagnosis not present

## 2017-01-03 DIAGNOSIS — I10 Essential (primary) hypertension: Secondary | ICD-10-CM | POA: Diagnosis not present

## 2017-01-03 DIAGNOSIS — Z125 Encounter for screening for malignant neoplasm of prostate: Secondary | ICD-10-CM | POA: Diagnosis not present

## 2017-01-03 DIAGNOSIS — Z89612 Acquired absence of left leg above knee: Secondary | ICD-10-CM

## 2017-01-03 DIAGNOSIS — Z89511 Acquired absence of right leg below knee: Secondary | ICD-10-CM | POA: Diagnosis not present

## 2017-01-03 DIAGNOSIS — Z6835 Body mass index (BMI) 35.0-35.9, adult: Secondary | ICD-10-CM | POA: Diagnosis not present

## 2017-01-03 DIAGNOSIS — Z1159 Encounter for screening for other viral diseases: Secondary | ICD-10-CM

## 2017-01-03 LAB — PSA: PSA: 6.8 ng/mL — AB (ref <=4.0)

## 2017-01-03 NOTE — Patient Instructions (Signed)
F/U 6 months

## 2017-01-03 NOTE — Assessment & Plan Note (Signed)
Controlled , no changes Reviewed recent fasting labs from endocrine with pt

## 2017-01-03 NOTE — Progress Notes (Signed)
   Subjective:    Patient ID: [name], male    DOB: 21-Feb-1951, 66 y.o.   MRN: [id number]  Patient presents for CPE (is not fasting)  Pt here for annual exam  Medications reviewed  Still follows with Dr. Naaman Plummer has prosthesis, using walker Nephrology- Dr. Florene Glen Cardiology- Dr. Percival Spanish Optometrist [facility]- due     DM- followed by endocrinology  Last A1C 6.7% HTN/Hyperlipidemia- taking meds as prescribed, no difficulty , lipids at goal LDL 53  Colonoscopy- UTD  Discussed hepatitis C screening  Needs PSA test Immunizations- UTD                                                                       No new concerns today   Review Of Systems:  GEN- denies fatigue, fever, weight loss,weakness, recent illness HEENT- denies eye drainage, change in vision, nasal discharge, CVS- denies chest pain, palpitations RESP- denies SOB, cough, wheeze ABD- denies N/V, change in stools, abd pain GU- denies dysuria, hematuria, dribbling, incontinence MSK- denies joint pain, muscle aches, injury Neuro- denies headache, dizziness, syncope, seizure activity       Objective:    BP 130/74   Pulse 76   Temp 98.1 F (36.7 C) (Oral)   Resp 14   Ht 5\' 10"  (1.778 m)   Wt 244 lb (110.7 kg)   SpO2 99%   BMI 35.01 kg/m  GEN- NAD, alert and oriented x3 HEENT- PERRL, EOMI, non injected sclera, pink conjunctiva, MMM, oropharynx clear, TM clear, canals clear, nares clear  Neck- Supple, no thyromegaly CVS- RRR, no murmur RESP-CTAB ABD-NABS,soft,NT,ND EXT-  bilat amputee/prosthesis Pulses- Radial 2+        Assessment & Plan:      Problem List Items Addressed This Visit    Status post below knee amputation of right lower extremity ([place])   Obesity   History of left above knee amputation ([place])   Essential hypertension    Controlled , no changes Reviewed recent fasting labs from endocrine with pt       Other Visit Diagnoses    Prostate cancer screening    -  Primary   Relevant  Orders   PSA   Routine general medical examination at a health care facility       CPE done, PSA and Hep C to be done   Encounter for hepatitis C screening test for low risk patient       Relevant Orders   Hepatitis C antibody      Note: This dictation was prepared with Dragon dictation along with smaller phrase technology. Any transcriptional errors that result from this process are unintentional.

## 2017-01-04 LAB — HEPATITIS C ANTIBODY: HCV Ab: NEGATIVE

## 2017-01-08 ENCOUNTER — Other Ambulatory Visit: Payer: Self-pay | Admitting: Family Medicine

## 2017-01-08 DIAGNOSIS — R972 Elevated prostate specific antigen [PSA]: Secondary | ICD-10-CM

## 2017-01-19 ENCOUNTER — Telehealth: Payer: Self-pay | Admitting: *Deleted

## 2017-01-19 NOTE — Telephone Encounter (Signed)
Received call from patient.   Reports that he has received a jury summons for the week of 02/12/2017.  Requested letter to excuse him from jury.   MD please advise.

## 2017-01-19 NOTE — Telephone Encounter (Signed)
I need to know why he feels he can't do jury duty

## 2017-01-19 NOTE — Telephone Encounter (Signed)
Call placed to patient.   States that he is B amputee and feels that it would cause great stress in his life and caregiver's life to have to serve on jury.   MD to be made aware.

## 2017-01-22 ENCOUNTER — Encounter: Payer: Self-pay | Admitting: Family Medicine

## 2017-01-22 NOTE — Telephone Encounter (Signed)
Call placed to patient. No answer. No VM.  

## 2017-01-22 NOTE — Telephone Encounter (Signed)
Call pt unfortunately he does not qualify for missing jury duty. He is able to sit with out any difficulty He does not take insulin shots during the day so his diabetes can not be used either.

## 2017-01-23 ENCOUNTER — Other Ambulatory Visit: Payer: Self-pay | Admitting: "Endocrinology

## 2017-01-23 LAB — COMPREHENSIVE METABOLIC PANEL
ALK PHOS: 64 U/L (ref 40–115)
ALT: 16 U/L (ref 9–46)
AST: 18 U/L (ref 10–35)
Albumin: 3.5 g/dL — ABNORMAL LOW (ref 3.6–5.1)
BUN: 26 mg/dL — ABNORMAL HIGH (ref 7–25)
CALCIUM: 9 mg/dL (ref 8.6–10.3)
CO2: 26 mmol/L (ref 20–31)
Chloride: 104 mmol/L (ref 98–110)
Creat: 1.51 mg/dL — ABNORMAL HIGH (ref 0.70–1.25)
GLUCOSE: 126 mg/dL — AB (ref 65–99)
POTASSIUM: 4.2 mmol/L (ref 3.5–5.3)
Sodium: 138 mmol/L (ref 135–146)
Total Bilirubin: 0.4 mg/dL (ref 0.2–1.2)
Total Protein: 6.5 g/dL (ref 6.1–8.1)

## 2017-01-23 NOTE — Telephone Encounter (Signed)
Call placed to patient. No answer. No VM.  

## 2017-01-24 LAB — HEMOGLOBIN A1C
Hgb A1c MFr Bld: 6.5 % — ABNORMAL HIGH (ref <5.7)
MEAN PLASMA GLUCOSE: 140 mg/dL

## 2017-01-25 NOTE — Telephone Encounter (Signed)
Multiple calls placed to patient with no answer and no return call.   Message to be closed.  

## 2017-01-30 ENCOUNTER — Other Ambulatory Visit: Payer: Self-pay | Admitting: "Endocrinology

## 2017-02-01 ENCOUNTER — Encounter: Payer: Self-pay | Admitting: "Endocrinology

## 2017-02-01 ENCOUNTER — Ambulatory Visit (INDEPENDENT_AMBULATORY_CARE_PROVIDER_SITE_OTHER): Payer: BLUE CROSS/BLUE SHIELD | Admitting: "Endocrinology

## 2017-02-01 DIAGNOSIS — IMO0001 Reserved for inherently not codable concepts without codable children: Secondary | ICD-10-CM

## 2017-02-01 DIAGNOSIS — I10 Essential (primary) hypertension: Secondary | ICD-10-CM | POA: Diagnosis not present

## 2017-02-01 DIAGNOSIS — E1159 Type 2 diabetes mellitus with other circulatory complications: Secondary | ICD-10-CM | POA: Diagnosis not present

## 2017-02-01 DIAGNOSIS — E6609 Other obesity due to excess calories: Secondary | ICD-10-CM | POA: Diagnosis not present

## 2017-02-01 DIAGNOSIS — Z6835 Body mass index (BMI) 35.0-35.9, adult: Secondary | ICD-10-CM

## 2017-02-01 DIAGNOSIS — E782 Mixed hyperlipidemia: Secondary | ICD-10-CM

## 2017-02-01 NOTE — Progress Notes (Signed)
Subjective:    Patient ID: [name], male    DOB: 04-03-1951, PCP Vic Blackbird, MD   Past Medical History:  Diagnosis Date  . Arthritis   . Cerebrovascular disease    MRI shows Right carotid inferior cavernours narrowing 75% and  50-75% stenosis of Cavernous and supraclinoi right side  . CKD (chronic kidney disease) 06/28/2014   Sees Dr Florene Glen  . Hyperlipidemia   . Hypertension   . Myocardial infarction    "mild" heart attack  . Necrosis ([place])    #2 nail   . Pneumonia    as a child  . Poor circulation of extremity   . Sleep apnea    uses cpap, getting a new one  . Type 2 Diabetes mellitus    Type 2   Past Surgical History:  Procedure Laterality Date  . ABOVE KNEE LEG AMPUTATION Left 2004  . AMPUTATION Right 04/28/2015   Procedure: AMPUTATION RAY, RIGHT 5TH TOE;  Surgeon: Marybelle Killings, MD;  Location: [facility];  Service: Orthopedics;  Laterality: Right;  . AMPUTATION Right 05/10/2015   Procedure: Right Below Knee Amputation;  Surgeon: Marybelle Killings, MD;  Location: [facility];  Service: Orthopedics;  Laterality: Right;  . CATARACT EXTRACTION W/PHACO  09/05/2012   Procedure: CATARACT EXTRACTION PHACO AND INTRAOCULAR LENS PLACEMENT (IOC);  Surgeon: Tonny Branch, MD;  Location: [facility];  Service: Ophthalmology;  Laterality: Left;  CDE=5.45  . CATARACT EXTRACTION W/PHACO  10/03/2012   Procedure: CATARACT EXTRACTION PHACO AND INTRAOCULAR LENS PLACEMENT (IOC);  Surgeon: Tonny Branch, MD;  Location: [facility];  Service: Ophthalmology;  Laterality: Right;  CDE: 12.31  . COLONOSCOPY     Social History   Social History  . Marital status: Married    Spouse name: N/A  . Number of children: 2  . Years of education: college   Occupational History  . [employer]   Social History Main Topics  . Smoking status: Never Smoker  . Smokeless tobacco: Never Used  . Alcohol use No  . Drug use: No  . Sexual activity: Not on file   Other Topics Concern  . Not on file   Social  History Narrative   Patient lives with his wife    Patient right handed   Patient drinks caffine on occ.   Outpatient Encounter Prescriptions as of 02/01/2017  Medication Sig  . acetaminophen (TYLENOL) 325 MG tablet Take 1-2 tablets (325-650 mg total) by mouth every 4 (four) hours as needed for mild pain.  Marland Kitchen atenolol (TENORMIN) 25 MG tablet TAKE 1 TABLET (25 MG TOTAL) BY MOUTH DAILY.  Marland Kitchen BYETTA 10 MCG PEN 10 MCG/0.04ML SOPN injection INJECT 0.4 MLS INTO SKIN TWO TIMES DAILY WITH FOOD  . Calcium Carb-Cholecalciferol (CALCIUM PLUS VITAMIN D3) 600-500 MG-UNIT CAPS Take 1 capsule by mouth daily.   . clopidogrel (PLAVIX) 75 MG tablet TAKE 1 TABLET BY MOUTH EVERY DAY  . furosemide (LASIX) 40 MG tablet TAKE 1 TABLET (40 MG TOTAL) BY MOUTH DAILY.  Marland Kitchen Insulin Glargine (TOUJEO SOLOSTAR) 300 UNIT/ML SOPN Inject 20 Units into the skin at bedtime.  . Insulin Pen Needle (B-D UF III MINI PEN NEEDLES) 31G X 5 MM MISC 4 x daily  . metFORMIN (GLUCOPHAGE) 500 MG tablet Take 1 tablet (500 mg total) by mouth 2 (two) times daily with a meal.  . rosuvastatin (CRESTOR) 20 MG tablet TAKE 1 TABLET BY MOUTH EVERY DAY   No facility-administered encounter medications on file  as of 02/01/2017.    ALLERGIES: Allergies  Allergen Reactions  . Zolpidem Tartrate Other (See Comments)    disorientation    VACCINATION STATUS: Immunization History  Administered Date(s) Administered  . Influenza Split 06/30/2013  . Influenza-Unspecified 07/30/2015  . Pneumococcal Polysaccharide-23 07/31/2007  . Td 10/30/2002  . Tdap 06/19/2016  . Zoster 06/19/2016    Diabetes  He presents for his follow-up diabetic visit. He has type 2 diabetes mellitus. Onset time: He was diagnosed at approximate age of 5 years. His disease course has been stable. There are no hypoglycemic associated symptoms. Pertinent negatives for hypoglycemia include no confusion, headaches, pallor or seizures. There are no diabetic associated symptoms. Pertinent  negatives for diabetes include no chest pain, no fatigue, no polydipsia, no polyphagia, no polyuria and no weakness. There are no hypoglycemic complications. Symptoms are stable. Diabetic complications include PVD. (Status post bilateral lower extremity amputations. He is wheelchair-bound, awaiting for leg prosthetics.) Risk factors for coronary artery disease include diabetes mellitus, dyslipidemia, hypertension, male sex, obesity, sedentary lifestyle and tobacco exposure. Current diabetic treatment includes insulin injections (Byetta 10 g subcutaneous twice a day.). He is compliant with treatment most of the time. His weight is increasing steadily (Overall he lost 75 pounds since 2012, including his amputations, however recently he is regaining his weight.). He is following a diabetic diet. When asked about meal planning, he reported none. He has had a previous visit with a dietitian. He never participates in exercise. His home blood glucose trend is decreasing steadily. His overall blood glucose range is 130-140 mg/dl. An ACE inhibitor/angiotensin II receptor blocker is being taken.  Hyperlipidemia  This is a chronic problem. The current episode started more than 1 year ago. The problem is controlled. Pertinent negatives include no chest pain, myalgias or shortness of breath. Current antihyperlipidemic treatment includes statins. Risk factors for coronary artery disease include dyslipidemia, diabetes mellitus, hypertension, male sex, obesity and a sedentary lifestyle.  Hypertension  This is a chronic problem. The current episode started more than 1 year ago. Pertinent negatives include no chest pain, headaches, neck pain, palpitations or shortness of breath. Risk factors for coronary artery disease include diabetes mellitus, dyslipidemia, male gender, obesity, sedentary lifestyle and smoking/tobacco exposure. Hypertensive end-organ damage includes PVD.     Review of Systems  Constitutional: Negative for  fatigue and unexpected weight change.  HENT: Negative for dental problem, mouth sores and trouble swallowing.   Eyes: Negative for visual disturbance.  Respiratory: Negative for cough, choking, chest tightness, shortness of breath and wheezing.   Cardiovascular: Negative for chest pain, palpitations and leg swelling.  Gastrointestinal: Negative for abdominal distention, abdominal pain, constipation, diarrhea, nausea and vomiting.  Endocrine: Negative for polydipsia, polyphagia and polyuria.  Genitourinary: Negative for dysuria, flank pain, hematuria and urgency.  Musculoskeletal: Positive for gait problem. Negative for back pain, myalgias and neck pain.       He now walks with bilateral leg prostheses and crutches.  Skin: Negative for pallor, rash and wound.  Neurological: Negative for seizures, syncope, weakness, numbness and headaches.  Psychiatric/Behavioral: Negative.  Negative for confusion, dysphoric mood and suicidal ideas.    Objective:    There were no vitals taken for this visit.  Wt Readings from Last 3 Encounters:  01/03/17 244 lb (110.7 kg)  11/02/16 244 lb (110.7 kg)  07/27/16 234 lb (106.1 kg)    Physical Exam  Constitutional: He is oriented to person, place, and time. He appears well-developed and well-nourished. He is cooperative. No  distress.  HENT:  Head: Normocephalic and atraumatic.  Eyes: EOM are normal.  Neck: Normal range of motion. Neck supple. No tracheal deviation present. No thyromegaly present.  Cardiovascular: Normal rate, S1 normal, S2 normal and normal heart sounds.  Exam reveals no gallop.   No murmur heard. Pulses:      Dorsalis pedis pulses are 1+ on the right side, and 1+ on the left side.       Posterior tibial pulses are 1+ on the right side, and 1+ on the left side.  Pulmonary/Chest: Breath sounds normal. No respiratory distress. He has no wheezes.  Abdominal: Soft. Bowel sounds are normal. He exhibits no distension. There is no tenderness.  There is no guarding and no CVA tenderness.  Musculoskeletal:       Right shoulder: He exhibits no swelling and no deformity.  He now walks with bilateral leg prostheses and crutches.  Neurological: He is alert and oriented to person, place, and time. He has normal strength and normal reflexes. No cranial nerve deficit or sensory deficit. Gait normal.  Skin: Skin is warm and dry. No rash noted. No cyanosis. Nails show no clubbing.  Psychiatric: He has a normal mood and affect. His speech is normal and behavior is normal. Judgment and thought content normal. Cognition and memory are normal.    Results for orders placed or performed in visit on 01/23/17  Comprehensive metabolic panel  Result Value Ref Range   Sodium 138 135 - 146 mmol/L   Potassium 4.2 3.5 - 5.3 mmol/L   Chloride 104 98 - 110 mmol/L   CO2 26 20 - 31 mmol/L   Glucose, Bld 126 (H) 65 - 99 mg/dL   BUN 26 (H) 7 - 25 mg/dL   Creat 1.51 (H) 0.70 - 1.25 mg/dL   Total Bilirubin 0.4 0.2 - 1.2 mg/dL   Alkaline Phosphatase 64 40 - 115 U/L   AST 18 10 - 35 U/L   ALT 16 9 - 46 U/L   Total Protein 6.5 6.1 - 8.1 g/dL   Albumin 3.5 (L) 3.6 - 5.1 g/dL   Calcium 9.0 8.6 - 10.3 mg/dL  Hemoglobin A1c  Result Value Ref Range   Hgb A1c MFr Bld 6.5 (H) <5.7 %   Mean Plasma Glucose 140 mg/dL   Complete Blood Count (Most recent): Lab Results  Component Value Date   WBC 7.6 05/20/2015   HGB 8.3 (L) 05/20/2015   HCT 25.4 (L) 05/20/2015   MCV 87.3 05/20/2015   PLT 380 05/20/2015   Chemistry (most recent): Lab Results  Component Value Date   NA 138 01/23/2017   K 4.2 01/23/2017   CL 104 01/23/2017   CO2 26 01/23/2017   BUN 26 (H) 01/23/2017   CREATININE 1.51 (H) 01/23/2017   Diabetic Labs (most recent): Lab Results  Component Value Date   HGBA1C 6.5 (H) 01/23/2017   HGBA1C 6.7 (H) 10/31/2016   HGBA1C 7.8 (H) 07/18/2016   Lipid profile (most recent): Lab Results  Component Value Date   TRIG 71 10/31/2016   CHOL 106  10/31/2016    Assessment & Plan:   1. Type 2 diabetes mellitus with other circulatory complications (HCC)  -His  diabetes is  complicated by extensive peripheral arterial disease with bilateral lower extremity amputations and patient remains at a high risk for more acute and chronic complications of diabetes which include CAD, CVA, CKD, retinopathy, and neuropathy. These are all discussed in detail with the patient.  Patient came with  controlled  Fasting glucose profile, and  recent A1c of 6.5% improving from 7.8%.   Recent labs reviewed, showing stage 2 CKD.   - I have re-counseled the patient on diet management and weight loss  by adopting a carbohydrate restricted / protein rich  Diet.  - Suggestion is made for patient to avoid simple carbohydrates   from their diet including Cakes , Desserts, Ice Cream,  Soda (  diet and regular) , Sweet Tea , Candies,  Chips, Cookies, Artificial Sweeteners,   and "Sugar-free" Products .  This will help patient to have stable blood glucose profile and potentially avoid unintended  Weight gain.  - Patient is advised to stick to a routine mealtimes to eat 3 meals  a day and avoid unnecessary snacks ( to snack only to correct hypoglycemia).  - The patient  has been  scheduled with Jearld Fenton, RDN, CDE for individualized DM education.  - I have approached patient with the following individualized plan to manage diabetes and patient agrees. - I will  Continue basal insulin  Toujeo 20 units daily at bedtime associated with monitoring of blood glucose every day before breakfast.  - Continue Byetta 10 mcg sq BID.  -I will continue metformin 500 mg by mouth twice a day.   - Patient specific target  for A1c; LDL, HDL, Triglycerides, and  Waist Circumference were discussed in detail.  2) BP/HTN: Controlled. Continue current medications including ACEI/ARB. 3) Lipids/HPL:  continue statins. 4)  Weight/Diet: CDE consult in progress, exercise, and  carbohydrates information provided.  5) Chronic Care/Health Maintenance:  -Patient is on ACEI/ARB and Statin medications and encouraged to continue to follow up with Ophthalmology, Podiatrist at least yearly or according to recommendations, and advised to  stay away from smoking. I have recommended yearly flu vaccine and pneumonia vaccination at least every 5 years; moderate intensity exercise for up to 150 minutes weekly; and  sleep for at least 7 hours a day. - A letter of  medical excuse from jury duty is given to him because of his inability to drive in relation to his bilateral lower extremity amputations. - 25 minutes of time was spent on the care of this patient , 50% of which was applied for counseling on diabetes complications and their preventions.  - I advised patient to maintain close follow up with Vic Blackbird, MD for primary care needs.  Patient is asked to bring meter and  blood glucose logs during their next visit.   Follow up plan: -Return in about 3 months (around 05/03/2017) for follow up with pre-visit labs, meter, and logs.  Glade Lloyd, MD Phone: [phone]  Fax: [phone]   02/01/2017, 11:25 AM

## 2017-02-01 NOTE — Patient Instructions (Signed)

## 2017-02-08 ENCOUNTER — Other Ambulatory Visit: Payer: Self-pay | Admitting: Family Medicine

## 2017-02-22 ENCOUNTER — Other Ambulatory Visit: Payer: Self-pay | Admitting: "Endocrinology

## 2017-03-06 ENCOUNTER — Other Ambulatory Visit: Payer: Self-pay | Admitting: *Deleted

## 2017-03-06 MED ORDER — ROSUVASTATIN CALCIUM 20 MG PO TABS: 20.0000 mg | ORAL_TABLET | Freq: Every day | ORAL | 2 refills | Status: DC

## 2017-03-22 ENCOUNTER — Other Ambulatory Visit: Payer: Self-pay

## 2017-03-22 MED ORDER — EXENATIDE 10 MCG/0.04ML ~~LOC~~ SOPN: PEN_INJECTOR | SUBCUTANEOUS | 1 refills | Status: DC

## 2017-03-30 ENCOUNTER — Other Ambulatory Visit: Payer: Self-pay | Admitting: Urology

## 2017-03-30 ENCOUNTER — Ambulatory Visit (INDEPENDENT_AMBULATORY_CARE_PROVIDER_SITE_OTHER): Payer: BLUE CROSS/BLUE SHIELD | Admitting: Urology

## 2017-03-30 DIAGNOSIS — N400 Enlarged prostate without lower urinary tract symptoms: Secondary | ICD-10-CM | POA: Diagnosis not present

## 2017-03-30 DIAGNOSIS — R972 Elevated prostate specific antigen [PSA]: Secondary | ICD-10-CM

## 2017-03-30 DIAGNOSIS — N500 Atrophy of testis: Secondary | ICD-10-CM | POA: Diagnosis not present

## 2017-04-05 ENCOUNTER — Other Ambulatory Visit: Payer: Self-pay | Admitting: "Endocrinology

## 2017-04-05 ENCOUNTER — Other Ambulatory Visit: Payer: Self-pay | Admitting: *Deleted

## 2017-04-05 MED ORDER — ROSUVASTATIN CALCIUM 20 MG PO TABS: 20.0000 mg | ORAL_TABLET | Freq: Every day | ORAL | 2 refills | Status: DC

## 2017-04-20 ENCOUNTER — Other Ambulatory Visit: Payer: Self-pay | Admitting: "Endocrinology

## 2017-04-27 ENCOUNTER — Encounter (HOSPITAL_COMMUNITY): Payer: Self-pay

## 2017-04-27 ENCOUNTER — Ambulatory Visit (HOSPITAL_COMMUNITY)
Admission: RE | Admit: 2017-04-27 | Discharge: 2017-04-27 | Disposition: A | Discharge status: Home / Self Care | Payer: BLUE CROSS/BLUE SHIELD | Source: Ambulatory Visit | Attending: Urology | Admitting: Urology

## 2017-04-27 DIAGNOSIS — C61 Malignant neoplasm of prostate: Secondary | ICD-10-CM | POA: Insufficient documentation

## 2017-04-27 DIAGNOSIS — R972 Elevated prostate specific antigen [PSA]: Secondary | ICD-10-CM | POA: Diagnosis not present

## 2017-04-27 MED ORDER — LIDOCAINE HCL (PF) 2 % IJ SOLN
10.0000 mL | Freq: Once | INTRAMUSCULAR | Status: AC
  Administered 2017-04-27: 10 mL via INTRADERMAL

## 2017-04-27 MED ORDER — LIDOCAINE HCL (PF) 1 % IJ SOLN
2.0000 mL | Freq: Once | INTRAMUSCULAR | Status: AC
  Administered 2017-04-27: 2 mL

## 2017-04-27 MED ORDER — CEFTRIAXONE SODIUM 1 G IJ SOLR
1.0000 g | INTRAMUSCULAR | Status: DC
  Administered 2017-04-27: 1 g via INTRAMUSCULAR

## 2017-04-27 NOTE — Discharge Instructions (Signed)
Transrectal Ultrasound-Guided Biopsy °A transrectal ultrasound-guided biopsy is a procedure to remove samples of tissue from your prostate using ultrasound images to guide the procedure. The procedure is usually done to evaluate the prostate gland of men who have an elevated prostate-specific antigen (PSA). PSA is a blood test to screen for prostate cancer. The biopsy samples are taken to check for prostate cancer. °Tell a health care provider about: °· Any allergies you have. °· All medicines you are taking, including vitamins, herbs, eye drops, creams, and over-the-counter medicines. °· Any problems you or family members have had with anesthetic medicines. °· Any blood disorders you have. °· Any surgeries you have had. °· Any medical conditions you have. °What are the risks? °Generally, this is a safe procedure. However, as with any procedure, problems can occur. Possible problems include: °· Infection of your prostate. °· Bleeding from your rectum or blood in your urine. °· Difficulty urinating. °· Nerve damage (this is usually temporary). °· Damage to surrounding structures such as blood vessels, organs, and muscles, which would require other procedures. ° °What happens before the procedure? °· Do not eat or drink anything after midnight on the night before the procedure or as directed by your health care provider. °· Take medicines only as directed by your health care provider. °· Your health care provider may have you stop taking certain medicines 5-7 days before the procedure. °· You will be given an enema before the procedure. During an enema, a liquid is injected into your rectum to clear out waste. °· You may have lab tests the day of your procedure. °· Plan to have someone take you home after the procedure. °What happens during the procedure? °· You will be given medicine to help you relax (sedative) before the procedure. An IV tube will be inserted into one of your veins and used to give fluids and  medicine. °· You will be given antibiotic medicine to reduce the risk of an infection. °· You will be placed on your side for the procedure. °· A probe with lubricated gel will be placed into your rectum, and images will be taken of your prostate and surrounding structures. °· Numbing medicine will be injected into the prostate before the biopsy samples are taken. °· A biopsy needle will then be inserted and guided to your prostate with the use of the ultrasound images. °· Samples of prostate tissue will be taken, and the needle will then be removed. °· The biopsy samples will be sent to a lab to be analyzed. Results are usually back in 2-3 days. °What happens after the procedure? °· You will be taken to a recovery area where you will be monitored. °· You may have some discomfort in the rectal area. You will be given pain medicines to control this. °· You may be allowed to go home the same day, or you may need to stay in the hospital overnight. °This information is not intended to replace advice given to you by your health care provider. Make sure you discuss any questions you have with your health care provider. °Document Released: 03/02/2014 Document Revised: 03/23/2016 Document Reviewed: 06/04/2013 °Elsevier Interactive Patient Education © 2018 [employer] ° °

## 2017-05-05 ENCOUNTER — Other Ambulatory Visit: Payer: Self-pay | Admitting: "Endocrinology

## 2017-05-09 ENCOUNTER — Telehealth: Payer: Self-pay | Admitting: *Deleted

## 2017-05-09 NOTE — Telephone Encounter (Signed)
Received call from patient.   Reports that he requires prescription for prosthetic sleeve for L AKA to be sent to [facility]. [phone]~ telephone/ [phone]~ fax.   MD please advise.

## 2017-05-10 NOTE — Telephone Encounter (Signed)
[place] with rx

## 2017-05-10 NOTE — Telephone Encounter (Signed)
Prescription faxed

## 2017-05-11 ENCOUNTER — Ambulatory Visit (INDEPENDENT_AMBULATORY_CARE_PROVIDER_SITE_OTHER): Payer: BLUE CROSS/BLUE SHIELD | Admitting: Urology

## 2017-05-11 DIAGNOSIS — C61 Malignant neoplasm of prostate: Secondary | ICD-10-CM | POA: Diagnosis not present

## 2017-05-11 DIAGNOSIS — E291 Testicular hypofunction: Secondary | ICD-10-CM | POA: Diagnosis not present

## 2017-05-15 ENCOUNTER — Other Ambulatory Visit: Payer: Self-pay | Admitting: "Endocrinology

## 2017-05-15 LAB — COMPREHENSIVE METABOLIC PANEL
ALBUMIN: 3.5 g/dL — AB (ref 3.6–5.1)
ALT: 18 U/L (ref 9–46)
AST: 19 U/L (ref 10–35)
Alkaline Phosphatase: 74 U/L (ref 40–115)
BUN: 30 mg/dL — ABNORMAL HIGH (ref 7–25)
CHLORIDE: 107 mmol/L (ref 98–110)
CO2: 25 mmol/L (ref 20–31)
CREATININE: 1.62 mg/dL — AB (ref 0.70–1.25)
Calcium: 8.9 mg/dL (ref 8.6–10.3)
Glucose, Bld: 184 mg/dL — ABNORMAL HIGH (ref 65–99)
POTASSIUM: 4.3 mmol/L (ref 3.5–5.3)
SODIUM: 142 mmol/L (ref 135–146)
Total Bilirubin: 0.5 mg/dL (ref 0.2–1.2)
Total Protein: 6.3 g/dL (ref 6.1–8.1)

## 2017-05-16 LAB — HEMOGLOBIN A1C
HEMOGLOBIN A1C: 6.9 % — AB (ref <5.7)
MEAN PLASMA GLUCOSE: 151 mg/dL

## 2017-05-17 ENCOUNTER — Encounter: Payer: Self-pay | Admitting: "Endocrinology

## 2017-05-17 ENCOUNTER — Ambulatory Visit (INDEPENDENT_AMBULATORY_CARE_PROVIDER_SITE_OTHER): Payer: BLUE CROSS/BLUE SHIELD | Admitting: "Endocrinology

## 2017-05-17 VITALS — BP 126/74 | HR 77 | Ht 70.0 in | Wt 243.0 lb

## 2017-05-17 DIAGNOSIS — E782 Mixed hyperlipidemia: Secondary | ICD-10-CM

## 2017-05-17 DIAGNOSIS — E1159 Type 2 diabetes mellitus with other circulatory complications: Secondary | ICD-10-CM | POA: Diagnosis not present

## 2017-05-17 DIAGNOSIS — I10 Essential (primary) hypertension: Secondary | ICD-10-CM

## 2017-05-17 NOTE — Patient Instructions (Signed)

## 2017-05-17 NOTE — Progress Notes (Signed)
Subjective:    Patient ID: [name], male    DOB: 1951-08-10, PCP Alycia Rossetti, MD   Past Medical History:  Diagnosis Date  . Arthritis   . Cerebrovascular disease    MRI shows Right carotid inferior cavernours narrowing 75% and  50-75% stenosis of Cavernous and supraclinoi right side  . CKD (chronic kidney disease) 06/28/2014   Sees Dr Florene Glen  . Hyperlipidemia   . Hypertension   . Myocardial infarction ([place])    "mild" heart attack  . Necrosis ([place])    #2 nail   . Pneumonia    as a child  . Poor circulation of extremity   . Sleep apnea    uses cpap, getting a new one  . Type 2 Diabetes mellitus    Type 2   Past Surgical History:  Procedure Laterality Date  . ABOVE KNEE LEG AMPUTATION Left 2004  . AMPUTATION Right 04/28/2015   Procedure: AMPUTATION RAY, RIGHT 5TH TOE;  Surgeon: Marybelle Killings, MD;  Location: [place];  Service: Orthopedics;  Laterality: Right;  . AMPUTATION Right 05/10/2015   Procedure: Right Below Knee Amputation;  Surgeon: Marybelle Killings, MD;  Location: [place];  Service: Orthopedics;  Laterality: Right;  . CATARACT EXTRACTION W/PHACO  09/05/2012   Procedure: CATARACT EXTRACTION PHACO AND INTRAOCULAR LENS PLACEMENT (IOC);  Surgeon: Tonny Branch, MD;  Location: [facility];  Service: Ophthalmology;  Laterality: Left;  CDE=5.45  . CATARACT EXTRACTION W/PHACO  10/03/2012   Procedure: CATARACT EXTRACTION PHACO AND INTRAOCULAR LENS PLACEMENT (IOC);  Surgeon: Tonny Branch, MD;  Location: [facility];  Service: Ophthalmology;  Laterality: Right;  CDE: 12.31  . COLONOSCOPY     Social History   Social History  . Marital status: Married    Spouse name: N/A  . Number of children: 2  . Years of education: college   Occupational History  . [employer]   Social History Main Topics  . Smoking status: Never Smoker  . Smokeless tobacco: Never Used  . Alcohol use No  . Drug use: No  . Sexual activity: Not on file   Other Topics Concern  . Not on file    Social History Narrative   Patient lives with his wife    Patient right handed   Patient drinks caffine on occ.   Outpatient Encounter Prescriptions as of 05/17/2017  Medication Sig  . acetaminophen (TYLENOL) 325 MG tablet Take 1-2 tablets (325-650 mg total) by mouth every 4 (four) hours as needed for mild pain.  Marland Kitchen atenolol (TENORMIN) 25 MG tablet TAKE 1 TABLET (25 MG TOTAL) BY MOUTH DAILY.  Marland Kitchen B-D UF III MINI PEN NEEDLES 31G X 5 MM MISC USE FOUR TIMES DAILY AS DIRECTED  . Calcium Carb-Cholecalciferol (CALCIUM PLUS VITAMIN D3) 600-500 MG-UNIT CAPS Take 1 capsule by mouth daily.   . clopidogrel (PLAVIX) 75 MG tablet TAKE 1 TABLET BY MOUTH EVERY DAY  . exenatide (BYETTA 10 MCG PEN) 10 MCG/0.04ML SOPN injection INJECT 0.4 MLS INTO SKIN TWO TIMES DAILY WITH FOOD  . furosemide (LASIX) 40 MG tablet TAKE 1 TABLET (40 MG TOTAL) BY MOUTH DAILY.  . metFORMIN (GLUCOPHAGE) 500 MG tablet TAKE 1 TABLET (500 MG TOTAL) BY MOUTH 2 (TWO) TIMES DAILY WITH A MEAL.  . rosuvastatin (CRESTOR) 20 MG tablet Take 1 tablet (20 mg total) by mouth daily.  Nelva Nay SOLOSTAR 300 UNIT/ML SOPN INJECT 20 UNITS INTO SKIN AT BEDTIME  . [DISCONTINUED] metFORMIN (GLUCOPHAGE) 500  MG tablet Take 1 tablet (500 mg total) by mouth 2 (two) times daily with a meal.   No facility-administered encounter medications on file as of 05/17/2017.    ALLERGIES: Allergies  Allergen Reactions  . Zolpidem Tartrate Other (See Comments)    disorientation    VACCINATION STATUS: Immunization History  Administered Date(s) Administered  . Influenza Split 06/30/2013  . Influenza-Unspecified 07/30/2015  . Pneumococcal Polysaccharide-23 07/31/2007  . Td 10/30/2002  . Tdap 06/19/2016  . Zoster 06/19/2016    Diabetes  He presents for his follow-up diabetic visit. He has type 2 diabetes mellitus. Onset time: He was diagnosed at approximate age of 45 years. His disease course has been stable. There are no hypoglycemic associated symptoms.  Pertinent negatives for hypoglycemia include no confusion, headaches, pallor or seizures. There are no diabetic associated symptoms. Pertinent negatives for diabetes include no chest pain, no fatigue, no polydipsia, no polyphagia, no polyuria and no weakness. There are no hypoglycemic complications. Symptoms are stable. Diabetic complications include PVD. (Status post bilateral lower extremity amputations. He is wheelchair-bound, awaiting for leg prosthetics.) Risk factors for coronary artery disease include diabetes mellitus, dyslipidemia, hypertension, male sex, obesity, sedentary lifestyle and tobacco exposure. Current diabetic treatment includes insulin injections (Byetta 10 g subcutaneous twice a day.). He is compliant with treatment most of the time. His weight is stable (Overall he lost 75 pounds since 2012, including his amputations, however recently he is regaining his weight.). He is following a diabetic diet. When asked about meal planning, he reported none. He has had a previous visit with a dietitian. He never participates in exercise. His home blood glucose trend is decreasing steadily. His overall blood glucose range is 130-140 mg/dl. An ACE inhibitor/angiotensin II receptor blocker is being taken.  Hyperlipidemia  This is a chronic problem. The current episode started more than 1 year ago. The problem is controlled. Pertinent negatives include no chest pain, myalgias or shortness of breath. Current antihyperlipidemic treatment includes statins. Risk factors for coronary artery disease include dyslipidemia, diabetes mellitus, hypertension, male sex, obesity and a sedentary lifestyle.  Hypertension  This is a chronic problem. The current episode started more than 1 year ago. Pertinent negatives include no chest pain, headaches, neck pain, palpitations or shortness of breath. Risk factors for coronary artery disease include diabetes mellitus, dyslipidemia, male gender, obesity, sedentary lifestyle  and smoking/tobacco exposure. Hypertensive end-organ damage includes PVD.     Review of Systems  Constitutional: Negative for fatigue and unexpected weight change.  HENT: Negative for dental problem, mouth sores and trouble swallowing.   Eyes: Negative for visual disturbance.  Respiratory: Negative for cough, choking, chest tightness, shortness of breath and wheezing.   Cardiovascular: Negative for chest pain, palpitations and leg swelling.  Gastrointestinal: Negative for abdominal distention, abdominal pain, constipation, diarrhea, nausea and vomiting.  Endocrine: Negative for polydipsia, polyphagia and polyuria.  Genitourinary: Negative for dysuria, flank pain, hematuria and urgency.  Musculoskeletal: Positive for gait problem. Negative for back pain, myalgias and neck pain.       He now walks with bilateral leg prostheses and crutches.  Skin: Negative for pallor, rash and wound.  Neurological: Negative for seizures, syncope, weakness, numbness and headaches.  Psychiatric/Behavioral: Negative.  Negative for confusion, dysphoric mood and suicidal ideas.    Objective:    BP 126/74   Pulse 77   Ht 5\' 10"  (1.778 m)   Wt 243 lb (110.2 kg)   BMI 34.87 kg/m   Wt Readings from Last 3 Encounters:  05/17/17 243 lb (110.2 kg)  01/03/17 244 lb (110.7 kg)  11/02/16 244 lb (110.7 kg)    Physical Exam  Constitutional: He is oriented to person, place, and time. He appears well-developed and well-nourished. He is cooperative. No distress.  HENT:  Head: Normocephalic and atraumatic.  Eyes: EOM are normal.  Neck: Normal range of motion. Neck supple. No tracheal deviation present. No thyromegaly present.  Cardiovascular: Normal rate, S1 normal, S2 normal and normal heart sounds.  Exam reveals no gallop.   No murmur heard. Pulses:      Dorsalis pedis pulses are 1+ on the right side, and 1+ on the left side.       Posterior tibial pulses are 1+ on the right side, and 1+ on the left side.   Pulmonary/Chest: Breath sounds normal. No respiratory distress. He has no wheezes.  Abdominal: Soft. Bowel sounds are normal. He exhibits no distension. There is no tenderness. There is no guarding and no CVA tenderness.  Musculoskeletal:       Right shoulder: He exhibits no swelling and no deformity.  He now walks with bilateral leg prostheses and crutches.  Neurological: He is alert and oriented to person, place, and time. He has normal strength and normal reflexes. No cranial nerve deficit or sensory deficit. Gait normal.  Skin: Skin is warm and dry. No rash noted. No cyanosis. Nails show no clubbing.  Psychiatric: He has a normal mood and affect. His speech is normal and behavior is normal. Judgment and thought content normal. Cognition and memory are normal.    Results for orders placed or performed in visit on 05/15/17  Comprehensive metabolic panel  Result Value Ref Range   Sodium 142 135 - 146 mmol/L   Potassium 4.3 3.5 - 5.3 mmol/L   Chloride 107 98 - 110 mmol/L   CO2 25 20 - 31 mmol/L   Glucose, Bld 184 (H) 65 - 99 mg/dL   BUN 30 (H) 7 - 25 mg/dL   Creat 1.62 (H) 0.70 - 1.25 mg/dL   Total Bilirubin 0.5 0.2 - 1.2 mg/dL   Alkaline Phosphatase 74 40 - 115 U/L   AST 19 10 - 35 U/L   ALT 18 9 - 46 U/L   Total Protein 6.3 6.1 - 8.1 g/dL   Albumin 3.5 (L) 3.6 - 5.1 g/dL   Calcium 8.9 8.6 - 10.3 mg/dL  Hemoglobin A1c  Result Value Ref Range   Hgb A1c MFr Bld 6.9 (H) <5.7 %   Mean Plasma Glucose 151 mg/dL   Complete Blood Count (Most recent): Lab Results  Component Value Date   WBC 7.6 05/20/2015   HGB 8.3 (L) 05/20/2015   HCT 25.4 (L) 05/20/2015   MCV 87.3 05/20/2015   PLT 380 05/20/2015   Chemistry (most recent): Lab Results  Component Value Date   NA 142 05/15/2017   K 4.3 05/15/2017   CL 107 05/15/2017   CO2 25 05/15/2017   BUN 30 (H) 05/15/2017   CREATININE 1.62 (H) 05/15/2017   Diabetic Labs (most recent): Lab Results  Component Value Date   HGBA1C 6.9  (H) 05/15/2017   HGBA1C 6.5 (H) 01/23/2017   HGBA1C 6.7 (H) 10/31/2016   Lipid profile (most recent): Lab Results  Component Value Date   TRIG 71 10/31/2016   CHOL 106 10/31/2016    Assessment & Plan:   1. Type 2 diabetes mellitus with other circulatory complications (HCC)  -His  diabetes is  complicated by extensive peripheral arterial disease  with bilateral lower extremity amputations and patient remains at a high risk for more acute and chronic complications of diabetes which include CAD, CVA, CKD, retinopathy, and neuropathy. These are all discussed in detail with the patient.  Patient came with controlled  Fasting glucose profile, and  recent A1c of 6.9%  Overall improving from 7.8%.   Recent labs reviewed, showing stage 2 CKD.   - I have re-counseled the patient on diet management and weight loss  by adopting a carbohydrate restricted / protein rich  Diet.  - Suggestion is made for patient to avoid simple carbohydrates   from his diet including Cakes , Desserts, Ice Cream,  Soda (  diet and regular) , Sweet Tea , Candies,  Chips, Cookies, Artificial Sweeteners,   and "Sugar-free" Products .  This will help patient to have stable blood glucose profile and potentially avoid unintended  Weight gain.  - Patient is advised to stick to a routine mealtimes to eat 3 meals  a day and avoid unnecessary snacks ( to snack only to correct hypoglycemia).  - The patient  has been  scheduled with Jearld Fenton, RDN, CDE for individualized DM education.  - I have approached patient with the following individualized plan to manage diabetes and patient agrees. - I will  Continue basal insulin  Toujeo 20 units daily at bedtime associated with monitoring of blood glucose every day before breakfast.  - Continue Byetta 10 mcg sq BID.  - He has stage  2-3 renal insufficiency, not a candidate for full dose metformin. However he will continue to benefit from metformin therapy with appropriate hydration.   - I will continue metformin 500 mg by mouth twice a day.   - Patient specific target  for A1c; LDL, HDL, Triglycerides, and  Waist Circumference were discussed in detail.  2) BP/HTN: Controlled. Continue current medications including ACEI/ARB. 3) Lipids/HPL:  continue statins. 4)  Weight/Diet: CDE consult in progress, exercise, and carbohydrates information provided.  5) Chronic Care/Health Maintenance:  -Patient is on ACEI/ARB and Statin medications and encouraged to continue to follow up with Ophthalmology, urology given his recent diagnosis of prostate cancer, Podiatrist at least yearly or according to recommendations, and advised to  stay away from smoking. I have recommended yearly flu vaccine and pneumonia vaccination at least every 5 years; moderate intensity exercise for up to 150 minutes weekly; and  sleep for at least 7 hours a day.  - 25 minutes of time was spent on the care of this patient , 50% of which was applied for counseling on diabetes complications and their preventions.  - I advised patient to maintain close follow up with Alycia Rossetti, MD for primary care needs.  Patient is asked to bring meter and  blood glucose logs during his next visit.   Follow up plan: -Return in about 4 months (around 09/17/2017) for meter, and logs.  Glade Lloyd, MD Phone: [phone]  Fax: [phone]   05/17/2017, 10:53 AM

## 2017-05-23 ENCOUNTER — Telehealth: Payer: Self-pay | Admitting: "Endocrinology

## 2017-05-23 NOTE — Telephone Encounter (Signed)
[name] is asking for a refill on Test Strips for his Bayer Monitor called to [facility], please advise?

## 2017-05-24 MED ORDER — GLUCOSE BLOOD VI STRP: ORAL_STRIP | 5 refills | Status: DC

## 2017-05-25 ENCOUNTER — Other Ambulatory Visit: Payer: Self-pay | Admitting: Urology

## 2017-05-25 DIAGNOSIS — C61 Malignant neoplasm of prostate: Secondary | ICD-10-CM

## 2017-05-28 ENCOUNTER — Other Ambulatory Visit: Payer: Self-pay | Admitting: "Endocrinology

## 2017-05-31 ENCOUNTER — Telehealth: Payer: Self-pay | Admitting: Medical Oncology

## 2017-05-31 NOTE — Telephone Encounter (Signed)
I called pt and spoke with his wife to introduce myself as the Prostate Nurse Navigator and the Coordinator of the Prostate [place].  1. I confirmed with the patient he is aware of his referral to the clinic 06/08/17 arriving between 7:45-8:00am.   2. I discussed the format of the clinic and the physicians he will be seeing that day.  3. I discussed where the clinic is located and how to contact me.  4. I confirmed his address and informed him I would be mailing a packet of information and forms to be completed. I asked him to bring them with him the day of his appointment.   He voiced understanding of the above. I asked him to call me if he has any questions or concerns regarding his appointments or the forms he needs to complete.

## 2017-06-05 DIAGNOSIS — Z6835 Body mass index (BMI) 35.0-35.9, adult: Secondary | ICD-10-CM | POA: Diagnosis not present

## 2017-06-05 DIAGNOSIS — Z89511 Acquired absence of right leg below knee: Secondary | ICD-10-CM | POA: Diagnosis not present

## 2017-06-05 DIAGNOSIS — I10 Essential (primary) hypertension: Secondary | ICD-10-CM | POA: Diagnosis not present

## 2017-06-05 DIAGNOSIS — I679 Cerebrovascular disease, unspecified: Secondary | ICD-10-CM | POA: Diagnosis not present

## 2017-06-05 DIAGNOSIS — Z89612 Acquired absence of left leg above knee: Secondary | ICD-10-CM | POA: Diagnosis not present

## 2017-06-05 DIAGNOSIS — N183 Chronic kidney disease, stage 3 (moderate): Secondary | ICD-10-CM | POA: Diagnosis not present

## 2017-06-06 ENCOUNTER — Encounter: Payer: Self-pay | Admitting: Radiation Oncology

## 2017-06-06 NOTE — Progress Notes (Signed)
GU Location of Tumor / Histology: prostatic adenocarcinoma  If Prostate Cancer, Gleason Score is (4 + 5) and PSA is (9.8) on 03/30/17 up from 6.8 on 12/28/16. Prostate volume: 35cc.  [name] was referred by Dr. Vic Blackbird to Dr. [name] for evaluation of an elevated PSA.   Biopsies of prostate (if applicable) revealed:    Past/Anticipated interventions by urology, if any: prostate biopsy, ordered PET scan (scheduled for 06/08/17 at 2pm), referred to [facility]  Past/Anticipated interventions by medical oncology, if any: no  Weight changes, if any: no  Bowel/Bladder complaints, if any: nocturia   Nausea/Vomiting, if any: no  Pain issues, if any:  no  SAFETY ISSUES:  Prior radiation? no  Pacemaker/ICD? no  Possible current pregnancy? no  Is the patient on methotrexate? no  Current Complaints / other details:  66 year old male. Married with two daughters. Bil LE amputation and left eye. Grandfather had prostate cancer. Dr. [name] recommending ADT with EXRT but recognizes patient is also a surgical candidate.

## 2017-06-07 ENCOUNTER — Telehealth: Payer: Self-pay | Admitting: Medical Oncology

## 2017-06-07 NOTE — Telephone Encounter (Signed)
Left a message to remind [facility] 7:45-8:00 am. I asked him to bring his completed medical forms. I reviewed location, valet parking and how to register. I asked him to call back to confirm.

## 2017-06-08 ENCOUNTER — Encounter (HOSPITAL_COMMUNITY): Payer: Self-pay

## 2017-06-08 ENCOUNTER — Other Ambulatory Visit: Payer: Self-pay | Admitting: Family Medicine

## 2017-06-08 ENCOUNTER — Ambulatory Visit
Admission: RE | Admit: 2017-06-08 | Discharge: 2017-06-08 | Disposition: A | Discharge status: Home / Self Care | Payer: Medicare Other | Source: Ambulatory Visit | Attending: Radiation Oncology | Admitting: Radiation Oncology

## 2017-06-08 ENCOUNTER — Encounter (HOSPITAL_COMMUNITY)
Admission: RE | Admit: 2017-06-08 | Discharge: 2017-06-08 | Disposition: A | Discharge status: Home / Self Care | Payer: Medicare Other | Source: Ambulatory Visit | Attending: Urology | Admitting: Urology

## 2017-06-08 ENCOUNTER — Encounter: Payer: Self-pay | Admitting: Radiation Oncology

## 2017-06-08 ENCOUNTER — Ambulatory Visit (HOSPITAL_BASED_OUTPATIENT_CLINIC_OR_DEPARTMENT_OTHER): Payer: Medicare Other | Admitting: Oncology

## 2017-06-08 ENCOUNTER — Encounter: Payer: Self-pay | Admitting: Medical Oncology

## 2017-06-08 ENCOUNTER — Encounter: Payer: Self-pay | Admitting: General Practice

## 2017-06-08 VITALS — BP 132/77 | HR 63 | Resp 16 | Wt 247.0 lb

## 2017-06-08 DIAGNOSIS — C61 Malignant neoplasm of prostate: Secondary | ICD-10-CM | POA: Insufficient documentation

## 2017-06-08 DIAGNOSIS — R972 Elevated prostate specific antigen [PSA]: Secondary | ICD-10-CM | POA: Diagnosis not present

## 2017-06-08 HISTORY — DX: Malignant neoplasm of prostate: C61

## 2017-06-08 MED ORDER — AXUMIN (FLUCICLOVINE F 18) INJECTION
9.9300 | Freq: Once | INTRAVENOUS | Status: AC
  Administered 2017-06-08: 9.93 via INTRAVENOUS

## 2017-06-08 NOTE — Consult Note (Signed)
Multi-Disciplinary Clinic     06/08/2017     --------------------------------------------------------------------------------     [name]   MRN: [id number]  PRIMARY CARE:  Modena Nunnery. Buelah Manis, MD   DOB: Oct 27, 1951, 66 year old Male  REFERRING:  [place] F. Buelah Manis, MD   SSN: -[id number]  PROVIDER:  Irine Seal, M.D.     TREATING:  Raynelle Bring, M.D.     LOCATION:  [facility] [phone]     --------------------------------------------------------------------------------     CC/HPI: CC: Prostate Cancer     Physician requesting consult: Dr. Irine Seal   PCP: Dr. 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 He underwent a TRUS biopsy of the prostate on 04/27/17 that confirmed Gleason 4+5=9 adenocarcinoma of the prostate with 9 out of 12 biopsy cores positive for malignancy.     Family history: Grandfather.     Imaging studies: Fluciclovine PET ordered and pending.     PMH: He has a history of diabetes, CAD, hyperlipidemia, hypertension, peripheral vascular disease (s/p BLE amputations), sleep apnea, and CKD (baseline Ct 1.5).   PSH: No abdominal surgeries.     TNM stage: cT1c Nx Mx   PSA: 9.8   Gleason score: 4+5=9   Biopsy (04/27/17): 9/12 cores positive   Left: L lateral apex (50%, 4+5=9), L apex (70%, 4+5=9), L lateral mid (90%, 4+5=9, PNI), L mid (95%, 4+5=9), L lateral base (95%, 4+5=9, PNI), L base (90%, 4+5=9)   Right: R apex (30%, 4+5=9), R lateral apex (10%, 4+5=9), R lateral base (10%, 3+4=7)   Prostate volume: 35.8 cc     Nomogram   OC disease: 15%   EPE: 83%   SVI: 36%   LNI: 30%   PFS (5 year, 10 year): 32%, 20%        ALLERGIES: Ambien       MEDICATIONS: Metformin Hcl   Plavix   Atenolol   Byetta   Calcium   Tojeou        GU PSH: Prostate  Needle Biopsy - 04/27/2017         PSH Notes: bil le amputation   left eye      NON-GU PSH: Surgical Pathology, Gross And Microscopic Examination For Prostate Needle - 04/27/2017       GU PMH: Primary hypogonadism, His Teststerone is low at 260. I have found low T to be associated with higher grade prostate cancer. - 05/11/2017  Prostate Cancer, He has high risk Gleason 9 T1c Nx Mx prostate cancer and needs staging studies. He has CRI with a Cr of 1.51 and I will try to get him set up for an [place] PET to avoid contrast. I think he is going to need ADT with EXRT but surgical therapy is an option. I am going to refer him to the Multidisciplinary Clinic for further evaluation. - 05/11/2017  Elevated PSA - 04/27/2017, He has an elevated PSA with a benign exam. I am going to repeat the PSA and if it remains elevated, I will have him return for a biopsy. I have reviewed the risks of bleeding, infection and voiding difficulty. , - 03/30/2017  BPH w/o LUTS - 03/30/2017  Testicular atrophy, He has testicular atrophy so I am going to get a baseline  testosterone level since that can impact PSA and the risk of high grade prostate cancer. - 03/30/2017       NON-GU PMH: Arthritis  Diabetes mellitus due to underlying condition with diabetic neuropathy, unspecified  Hypercholesterolemia  Hypertension  Peripheral vascular disease  Sleep Apnea       FAMILY HISTORY: Heart Attack - Mother  Prostate Cancer - Grandfather  Sickle Cell Hb-Brownfield Disease - Daughter  stroke - Mother     SOCIAL HISTORY: Marital Status: Married  Preferred Language: English  Current Smoking Status: Patient has never smoked.     Tobacco Use Assessment Completed: Used Tobacco in last 30 days?  Has never drank.   Does not drink caffeine.  Patient's occupation Engineer, maintenance.       Notes: 2 daughters     REVIEW OF SYSTEMS:     GU Review Male:   Patient denies frequent urination, hard to postpone urination, burning/ pain with urination, get  up at night to urinate, leakage of urine, stream starts and stops, trouble starting your streams, and have to strain to urinate .   Gastrointestinal (Lower):   Patient denies diarrhea and constipation.   Gastrointestinal (Upper):   Patient denies nausea and vomiting.   Constitutional:   Patient denies fever, night sweats, weight loss, and fatigue.   Skin:   Patient denies skin rash/ lesion and itching.   Eyes:   Patient denies blurred vision and double vision.   Ears/ Nose/ Throat:   Patient denies sore throat and sinus problems.   Hematologic/Lymphatic:   Patient denies swollen glands and easy bruising.   Cardiovascular:   Patient denies leg swelling and chest pains.   Respiratory:   Patient denies cough and shortness of breath.   Endocrine:   Patient denies excessive thirst.   Musculoskeletal:   Patient denies back pain and joint pain.   Neurological:   Patient denies headaches and dizziness.   Psychologic:   Patient denies depression and anxiety.     VITAL SIGNS: None     MULTI-SYSTEM PHYSICAL EXAMINATION:     Constitutional: Well-nourished. No physical deformities. Normally developed. Good grooming.        PAST DATA REVIEWED:   Source Of History:  Patient   Lab Test Review:   PSA   Records Review:   Pathology Reports, Previous Patient Records    03/30/17 12/28/16   PSA   Total PSA 9.8 ng/dl 6.8 ng/dl   Free PSA 0.9 ng/dl    % Free PSA 9 %       03/30/17   Hormones   Testosterone, Total 261 pg/dL       PROCEDURES: None     ASSESSMENT:       ICD-10 Details   1 GU:   Prostate Cancer - C61      PLAN:             Document  Letter(s):  Created for Patient: Clinical Summary            Notes:   1. High risk prostate cancer: I had a long conversation with [name], his wife, and his daughter today. He has already seen Dr. Alen Blew this morning and will be seeing Dr. Tammi Klippel later. He does have a staging fluciclovine PET scheduled for later today and we discussed how the  results of this test will help determine whether he may have metastatic disease or not.     If he does not have evidence of metastatic disease, we  discussed options for treatment of high risk prostate cancer including multimodality approaches with primary surgery and subsequent therapy or primary radiation and ADT. Considering his extensive medical comorbidities, I did not recommend surgical therapy. We agreed that his best option would be to consider long term ADT and radiation. He will discuss this further with Dr. Tammi Klippel later this morning.     If his PET is concerning for measurable metastatic disease, he understands this would be a treatable but not curable situation. He has discussed systemic therapy options with Dr. Alen Blew including ADT +/- abiraterone or docetaxel/prednisone.     Regardless of the results, ADT will be a part of his treatment. I will notify Dr. [name] so he can began that part of his therapy. If he proceeds with radiation, he will also need fiducial markers placed.     cc: Dr. Zola Button   Dr. Tyler Pita   Dr. Irine Seal   Dr. Vic Blackbird       E & M CODE: I spent at least 22 minutes face to face with the patient, more than 50% of that time was spent on counseling and/or coordinating care.

## 2017-06-08 NOTE — Progress Notes (Signed)
Reason for Referral: Prostate cancer.   HPI: 66 year old gentleman with history of diabetes, hyperlipidemia and peripheral vascular disease and recent diagnosis of prostate cancer. He was found to have an elevated PSA by his primary care physician and he was referred to Dr. [name] for further evaluation. His PSA went from 6.82 to 9.8 which prompted a biopsy on 04/27/2017. His biopsy showed a Gleason score 4+5 = 9 and multiple cores. With close to 9 cores involved with cancer. He was staging workup is pending at this time which will be completed today. Clinically, he reports no symptoms related to his prostate cancer. He denied any hematuria, dysuria or skeletal complaints. He denied any recent falls or syncope.  He does not report any headaches, blurry vision, syncope or seizures. He does not report any fevers, chills or sweats. He does not report any chest pain, palpitation, orthopnea or edema. He does not report any cough, wheezing or hemoptysis. He does not report any nausea, vomiting or abdominal pain. He does not report any hematuria or dysuria. Remaining review of systems unremarkable.   Past Medical History:  Diagnosis Date  . Arthritis   . Cerebrovascular disease    MRI shows Right carotid inferior cavernours narrowing 75% and  50-75% stenosis of Cavernous and supraclinoi right side  . CKD (chronic kidney disease) 06/28/2014   Sees Dr Florene Glen  . Hyperlipidemia   . Hypertension   . Myocardial infarction ([place])    "mild" heart attack  . Necrosis ([place])    #2 nail   . Pneumonia    as a child  . Poor circulation of extremity   . Prostate cancer ([place])   . Sleep apnea    uses cpap, getting a new one  . Type 2 Diabetes mellitus    Type 2  :  Past Surgical History:  Procedure Laterality Date  . ABOVE KNEE LEG AMPUTATION Left 2004  . AMPUTATION Right 04/28/2015   Procedure: AMPUTATION RAY, RIGHT 5TH TOE;  Surgeon: Marybelle Killings, MD;  Location: [place];  Service: Orthopedics;  Laterality:  Right;  . AMPUTATION Right 05/10/2015   Procedure: Right Below Knee Amputation;  Surgeon: Marybelle Killings, MD;  Location: [place];  Service: Orthopedics;  Laterality: Right;  . CATARACT EXTRACTION W/PHACO  09/05/2012   Procedure: CATARACT EXTRACTION PHACO AND INTRAOCULAR LENS PLACEMENT (IOC);  Surgeon: Tonny Branch, MD;  Location[facility];  Service: Ophthalmology;  Laterality: Left;  CDE=5.45  . CATARACT EXTRACTION W/PHACO  10/03/2012   Procedure: CATARACT EXTRACTION PHACO AND INTRAOCULAR LENS PLACEMENT (IOC);  Surgeon: Tonny Branch, MD;  Location: [facility];  Service: Ophthalmology;  Laterality: Right;  CDE: 12.31  . COLONOSCOPY    :   Current Outpatient Prescriptions:  .  acetaminophen (TYLENOL) 325 MG tablet, Take 1-2 tablets (325-650 mg total) by mouth every 4 (four) hours as needed for mild pain., Disp: , Rfl:  .  atenolol (TENORMIN) 25 MG tablet, TAKE 1 TABLET (25 MG TOTAL) BY MOUTH DAILY., Disp: 90 tablet, Rfl: 2 .  B-D UF III MINI PEN NEEDLES 31G X 5 MM MISC, USE FOUR TIMES DAILY AS DIRECTED, Disp: 150 each, Rfl: 2 .  BYETTA 10 MCG PEN 10 MCG/0.04ML SOPN injection, INJECT 0.4 MLS INTO SKIN TWO TIMES DAILY WITH FOOD, Disp: 7.2 mL, Rfl: 2 .  Calcium Carb-Cholecalciferol (CALCIUM PLUS VITAMIN D3) 600-500 MG-UNIT CAPS, Take 1 capsule by mouth daily. , Disp: , Rfl:  .  clopidogrel (PLAVIX) 75 MG tablet, TAKE 1 TABLET BY MOUTH  EVERY DAY, Disp: 90 tablet, Rfl: 1 .  furosemide (LASIX) 40 MG tablet, TAKE 1 TABLET (40 MG TOTAL) BY MOUTH DAILY., Disp: 90 tablet, Rfl: 3 .  glucose blood (BAYER CONTOUR TEST) test strip, Use as instructed bid. E11.65, Disp: 100 each, Rfl: 5 .  metFORMIN (GLUCOPHAGE) 500 MG tablet, TAKE 1 TABLET (500 MG TOTAL) BY MOUTH 2 (TWO) TIMES DAILY WITH A MEAL., Disp: 180 tablet, Rfl: 0 .  rosuvastatin (CRESTOR) 20 MG tablet, Take 1 tablet (20 mg total) by mouth daily., Disp: 90 tablet, Rfl: 2 .  TOUJEO SOLOSTAR 300 UNIT/ML SOPN, INJECT 20 UNITS INTO SKIN AT BEDTIME, Disp: 4.5 mL, Rfl:  2:  Allergies  Allergen Reactions  . Zolpidem Tartrate Other (See Comments)    disorientation   :  Family History  Problem Relation Age of Onset  . Diabetes Mother   . Hypertension Mother   . Cancer Father   . Cancer Sister   . Sickle cell anemia Daughter   :  Social History   Social History  . Marital status: Married    Spouse name: N/A  . Number of children: 2  . Years of education: college   Occupational History  . [employer]   Social History Main Topics  . Smoking status: Never Smoker  . Smokeless tobacco: Never Used  . Alcohol use No  . Drug use: No  . Sexual activity: Not on file   Other Topics Concern  . Not on file   Social History Narrative   Patient lives with his wife    Patient right handed   Patient drinks caffine on occ.  :  Pertinent items are noted in HPI.  Exam: ECOG 1  General appearance: alert and cooperative Throat: lips, mucosa, and tongue normal; teeth and gums normal Neck: no adenopathy Back: negative Resp: clear to auscultation bilaterally Cardio: regular rate and rhythm, S1, S2 normal, no murmur, click, rub or gallop GI: soft, non-tender; bowel sounds normal; no masses,  no organomegaly  Extremities: Bilateral amputation noted. No thigh edema.   Assessment and Plan:   66 year old gentleman with diagnosis of prostate cancer in June 2018. His PSA is 9.8 and a Gleason score of 4+5 = 9 in multiple cores. His Axumin scan scheduled later today for staging workup. His case was discussed of any prostate cancer multidisciplinary clinic. His pathology was discussed with the reviewing pathologist.  The natural course of this disease was discussed today with the patient and his family. He appears to have a high risk prostate cancer that could potentially be prostate confined. Definitive options include radiation therapy with long-term androgen deprivation if he does not have any metastatic disease. If metastatic disease  is detected, systemic therapy alone would be his best option. I have discussed with him the logistics of androgen deprivation therapy which likely will be the backbone of his treatment. Complications include hot flashes, weight gain hyperlipidemia among others.  If he has advanced disease that is rather bulky with visceral metastasis, additional therapy might be needed. If no metastatic disease is detected, definitive therapy with radiation and androgen deprivation therapy would be the best choice for him.

## 2017-06-08 NOTE — Progress Notes (Signed)
                               Care Plan Summary  Name: Mr. [name] DOB: August 28, 1951   Your Medical Team:   Urologist -  Dr. Raynelle Bring, [facility]  Radiation Oncologist - Dr. Tyler Pita, [facility]   Medical Oncologist - Dr. Zola Button, [place]  Recommendations: 1) PET scan (scheduled for 2 pm today) 2)Androgen Deprivation (hormone injections)  3) Radiation treatments  * These recommendations are based on information available as of today's consult.      Recommendations may change depending on the results of further tests or exams.  Next Steps: 1) Dr. [name] will call with PET results 2) Dr. Ralene Muskrat office will schedule hormone injection 3) Dr. Ralene Muskrat office will schedule placement of gold markers for radiation 4) Dr. Johny Shears office will schedule radiation treatments  When appointments need to be scheduled, you will be contacted by [facility] and/or [facility].  [name] was given business cards for all providers and also a copy of "[place] [facility] Patient Safety Plan". I also provided him with a copy of "Androgen Deprivation Therapy".  Questions?  Please do not hesitate to call Cira Rue, RN, BSN, OCN at [phone] any questions or concerns.  Shirlean Mylar is your Oncology Nurse Navigator and is available to assist you while you're receiving your medical care at [facility].

## 2017-06-08 NOTE — Progress Notes (Signed)
Radiation Oncology         [phone] ________________________________  Multidisciplinary Prostate Cancer Clinic  Initial Radiation Oncology Consultation  Name: [name] MRN: [id number]  Date: 06/08/2017  DOB: 07-Jan-1951  RJ:JOACZY, Modena Nunnery, MD  Raynelle Bring, MD   REFERRING PHYSICIAN: Raynelle Bring, MD  DIAGNOSIS: 66 y.o. gentleman with stage cT1c adenocarcinoma of the prostate with a Gleason's score of 4+5 and a PSA of 9.8.  HISTORY OF PRESENT ILLNESS::[name] is a 66 y.o. gentleman.  He was noted to have an elevated PSA of 6.8 by his primary care physician, Dr. Vic Blackbird.  Accordingly, he was referred for evaluation in urology by Dr. Irine Seal on 03/30/17,  digital rectal examination was performed at that time revealing a symmetrical prostate without nodules.  Repeat PSA on 03/30/17 was further elevated at 9.8.  The patient proceeded to transrectal ultrasound with 12 biopsies of the prostate on 04/27/17.  The prostate volume measured 35.8 cc.  Out of 12 core biopsies,9 were positive.  The maximum Gleason score was 4+5=9, and this was seen in all cores except the right mid, right apex and right mid lateral (small focus of atypical glands).  He has not yet had staging studies but is scheduled for an Axumin PET scan today at 2pm.  The patient reviewed the biopsy results with his urologist and he has kindly been referred today to the multidisciplinary prostate cancer clinic for presentation of pathology and radiology studies in our conference for discussion of potential radiation treatment options and clinical evaluation.  PSA 03/30/17- 9.8 12/28/16 6.8    PREVIOUS RADIATION THERAPY: No  PAST MEDICAL HISTORY:  has a past medical history of Arthritis; Cerebrovascular disease; CKD (chronic kidney disease) (06/28/2014); Hyperlipidemia; Hypertension; Myocardial infarction ([place]); Necrosis ([place]); Pneumonia; Poor circulation of extremity; Prostate cancer ([place]); Sleep apnea; and Type 2  Diabetes mellitus.    PAST SURGICAL HISTORY: Past Surgical History:  Procedure Laterality Date  . ABOVE KNEE LEG AMPUTATION Left 2004  . AMPUTATION Right 04/28/2015   Procedure: AMPUTATION RAY, RIGHT 5TH TOE;  Surgeon: Marybelle Killings, MD;  Location: [place];  Service: Orthopedics;  Laterality: Right;  . AMPUTATION Right 05/10/2015   Procedure: Right Below Knee Amputation;  Surgeon: Marybelle Killings, MD;  Location: [place];  Service: Orthopedics;  Laterality: Right;  . CATARACT EXTRACTION W/PHACO  09/05/2012   Procedure: CATARACT EXTRACTION PHACO AND INTRAOCULAR LENS PLACEMENT (IOC);  Surgeon: Tonny Branch, MD;  [facility];  Service: Ophthalmology;  Laterality: Left;  CDE=5.45  . CATARACT EXTRACTION W/PHACO  10/03/2012   Procedure: CATARACT EXTRACTION PHACO AND INTRAOCULAR LENS PLACEMENT (IOC);  Surgeon: Tonny Branch, MD;  Location[facility];  Service: Ophthalmology;  Laterality: Right;  CDE: 12.31  . COLONOSCOPY      FAMILY HISTORY: family history includes Cancer in his father, maternal grandfather, and sister; Diabetes in his mother; Hypertension in his mother; Sickle cell anemia in his daughter.  SOCIAL HISTORY:  reports that he has never smoked. He has never used smokeless tobacco. He reports that he does not drink alcohol or use drugs.  ALLERGIES: Zolpidem tartrate  MEDICATIONS:  Current Outpatient Prescriptions  Medication Sig Dispense Refill  . acetaminophen (TYLENOL) 325 MG tablet Take 1-2 tablets (325-650 mg total) by mouth every 4 (four) hours as needed for mild pain.    Marland Kitchen atenolol (TENORMIN) 25 MG tablet TAKE 1 TABLET (25 MG TOTAL) BY MOUTH DAILY. 90 tablet 2  . B-D UF III MINI PEN  NEEDLES 31G X 5 MM MISC USE FOUR TIMES DAILY AS DIRECTED 150 each 2  . BYETTA 10 MCG PEN 10 MCG/0.04ML SOPN injection INJECT 0.4 MLS INTO SKIN TWO TIMES DAILY WITH FOOD 7.2 mL 2  . Calcium Carb-Cholecalciferol (CALCIUM PLUS VITAMIN D3) 600-500 MG-UNIT CAPS Take 1 capsule by mouth daily.     . clopidogrel  (PLAVIX) 75 MG tablet TAKE 1 TABLET BY MOUTH EVERY DAY 90 tablet 1  . furosemide (LASIX) 40 MG tablet TAKE 1 TABLET (40 MG TOTAL) BY MOUTH DAILY. 90 tablet 3  . glucose blood (BAYER CONTOUR TEST) test strip Use as instructed bid. E11.65 100 each 5  . metFORMIN (GLUCOPHAGE) 500 MG tablet TAKE 1 TABLET (500 MG TOTAL) BY MOUTH 2 (TWO) TIMES DAILY WITH A MEAL. 180 tablet 0  . rosuvastatin (CRESTOR) 20 MG tablet Take 1 tablet (20 mg total) by mouth daily. 90 tablet 2  . TOUJEO SOLOSTAR 300 UNIT/ML SOPN INJECT 20 UNITS INTO SKIN AT BEDTIME 4.5 mL 2   No current facility-administered medications for this encounter.     REVIEW OF SYSTEMS:  On review of systems, the patient reports that he is doing well overall. He denies any chest pain, shortness of breath, cough, fevers, chills, night sweats, unintended weight changes. He denies any bowel disturbances, and denies abdominal pain, nausea or vomiting. He denies any new musculoskeletal or joint aches or pains. He reports nocturia x1/night but denies other bothersome LUTS.  He specifically denies dysuria, gross hematuria, increased frequency, urgency, weak stream or incomplete emptying. He is unable to complete sexual activity with most attempts. A complete review of systems is obtained and is otherwise negative.    PHYSICAL EXAM:    weight is 247 lb (112 kg). His blood pressure is 132/77 and his pulse is 63. His respiration is 16 and oxygen saturation is 100%.  In general this is a well appearing african [place] male in no acute distress. He is alert and oriented x4 and appropriate throughout the examination. HEENT reveals that the patient is normocephalic, atraumatic. EOMs are intact. PERRLA. Skin is intact without any evidence of gross lesions. Cardiovascular exam reveals a regular rate and rhythm, no clicks rubs or murmurs are auscultated. Chest is clear to auscultation bilaterally. Lymphatic assessment is performed and does not reveal any adenopathy in the  cervical, supraclavicular, axillary, or inguinal chains. Abdomen has active bowel sounds in all quadrants and is intact. The abdomen is soft, non tender, non distended. He has bilateral BKA.   KPS = 80  100 - Normal; no complaints; no evidence of disease. 90   - Able to carry on normal activity; minor signs or symptoms of disease. 80   - Normal activity with effort; some signs or symptoms of disease. 71   - Cares for self; unable to carry on normal activity or to do active work. 60   - Requires occasional assistance, but is able to care for most of his personal needs. 50   - Requires considerable assistance and frequent medical care. 18   - Disabled; requires special care and assistance. 38   - Severely disabled; hospital admission is indicated although death not imminent. 63   - Very sick; hospital admission necessary; active supportive treatment necessary. 10   - Moribund; fatal processes progressing rapidly. 0     - Dead  Karnofsky DA, Abelmann WH, Craver LS and Burchenal [facility] [phone]) The use of the nitrogen mustards in the palliative treatment of carcinoma: with particular reference  to bronchogenic carcinoma Cancer 1 634-56   LABORATORY DATA:  Lab Results  Component Value Date   WBC 7.6 05/20/2015   HGB 8.3 (L) 05/20/2015   HCT 25.4 (L) 05/20/2015   MCV 87.3 05/20/2015   PLT 380 05/20/2015   Lab Results  Component Value Date   NA 142 05/15/2017   K 4.3 05/15/2017   CL 107 05/15/2017   CO2 25 05/15/2017   Lab Results  Component Value Date   ALT 18 05/15/2017   AST 19 05/15/2017   ALKPHOS 74 05/15/2017   BILITOT 0.5 05/15/2017     RADIOGRAPHY: Nm Pet (axumin) Skull Base To Mid Thigh  Result Date: 06/08/2017 CLINICAL DATA:  Gleason 9 prostate carcinoma.  PSA equals 6.8. EXAM: NUCLEAR MEDICINE PET SKULL BASE TO THIGH TECHNIQUE: 9.9 mCi F-18 Fluciclovine was injected intravenously. Full-ring PET imaging was performed from the skull base to thigh after the radiotracer. CT data  was obtained and used for attenuation correction and anatomic localization. COMPARISON:  None. FINDINGS: NECK No radiotracer activity in neck lymph nodes. CHEST No radiotracer accumulation within mediastinal or hilar lymph nodes. No suspicious pulmonary nodules on the CT scan. ABDOMEN/PELVIS Intense radiotracer activity within the posterior LEFT quadrant of the prostate gland with SUV max equal 15.2. This activity involves the LEFT base, mid gland and apex. The seminal vesicles appear grossly uninvolved. Radiotracer activity localizes to a small RIGHT external iliac lymph node measuring 7 mm short axis (image 177, series 4) with SUV max equal 5.5. No additional radiotracer activity within pelvic lymph nodes. Single periaortic lymph node at the level of the SMA also has radiotracer accumulation measuring only 4 mm short axis (image 113, series 4 close per with SUV max equal 3.6. Activity is mild (less than marrow activity) however significant for size. SKELETON No focal  activity to suggest skeletal metastasis. IMPRESSION: 1. Focal activity within the LEFT prostate gland consistent with prostate adenocarcinoma. 2. Evidence for nodal metastasis to a RIGHT external iliac lymph node. 3. Radiotracer activity in a high periaortic lymph node is indeterminate. 4. No evidence skeletal metastasis. Electronically Signed   By: Suzy Bouchard M.D.   On: 06/08/2017 16:17      IMPRESSION/PLAN:  66 y.o. gentleman with stage cT1c adenocarcinoma of the prostate with a Gleason's score of 4+5 and a PSA of 9.8. We discussed the patient's workup and outlines the nature of prostate cancer in this setting. The patient's Gleason's score puts him into the high risk group. Accordingly, if PET does not show distant metastatic disease, he may be eligible for a variety of potential treatment options including 8 weeks of external radiation or 5 weeks of external radiation followed by a brachytherapy boost, in combination with LT-ADT. We  discussed the available radiation techniques, and focused on the details and logistics and delivery. The patient may not be an ideal candidate for prostatectomy or brachytherapy given his multiple medical co-morbidities and high risk for going under anesthesia.   However, we discussed and outlined the risks, benefits, short and long-term effects associated with radiotherapy and compared and contrasted these with prostatectomy. We also detailed the role of ADT in the treatment of high risk prostate cancer and outlined the associated side effects that could be expected with this therapy. The recommendation for treatment would be to begin LT-ADT now with plans to initiate radiotherapy after he has completed 2 months of androgen deprivation therapy.  At the end of the conversation the patient is interested in moving forward  with 8 weeks of external radiotherapy in combination with LT-ADT if he is deemed appropriate based on his upcoming Axumin PET results.  Will will forward with coordinating a follow-up visit with Dr. [name] 2 initiate ADT now.  Once we have the results of the PET scan, we will share our discussion with Dr. [name] and move forward with scheduling placement of 3 gold fiducial markers into the prostate to proceed with IMRT after he has completed 2 months of androgen deprivation therapy.  If he is a candidate for EBRT, he would like to have his daily treatments in [place] since this is closer to his home and will be more convenient.  In that case, we will be happy to make the referral to Dr. [name].   [name], PA-C    Tyler Pita, MD  [facility] Direct Dial: [phone]  Fax: [phone] [email or web address]  Skype  LinkedIn     This document serves as a record of services personally performed by Tyler Pita MD. It was created on his behalf by Delton Coombes, a trained medical scribe. The creation of this record is based on the scribe's personal  observations and the provider's statements to them. This document has been checked and approved by the attending provider.         ADDENDUM: Axumin PET shows a positive external iliac node representing stage IVA disease.  Definitive radiation is still an option in this setting for curative intent, however, recommendations regarding optimal therapy can be variable, so, we will defer to Dr. [name] regarding his opinion on this on referral.  1. Focal activity within the LEFT prostate gland consistent with prostate adenocarcinoma.    2. Evidence for nodal metastasis to a RIGHT external iliac lymph node.    3. Radiotracer activity in a high periaortic lymph node is indeterminate.  4. No evidence skeletal metastasis.                  \

## 2017-06-08 NOTE — Progress Notes (Signed)
[place] Psychosocial Distress Screening Spiritual Care  Met with [name], wife [name], and daughter [name] in Breast Multidisciplinary Clinic to introduce [name] team/resources, reviewing distress screen per protocol.  The patient scored a 0 on the Psychosocial Distress Thermometer which indicates minimal distress. Also assessed for distress and other psychosocial needs.   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    [name] also notes that his older daughter died due to complications from sickle-cell disease, so he and and his wife are raising their 51yo grandson, who has therapeutic support through his grief.  In the midst of strong faith, I also sought to normalize multiple feelings and to offer empathy regarding the family's loss and grief.   Follow up needed: No.  Family is aware of ongoing [place] team/programming availability, including f/u Spiritual Care phone appointments if the distance from [place] is a barrier.  They plan to reach out as desired.   [place], [place], [facility] Pager [phone] Voicemail [phone]

## 2017-06-11 ENCOUNTER — Encounter: Payer: Self-pay | Admitting: *Deleted

## 2017-06-12 ENCOUNTER — Other Ambulatory Visit: Payer: Self-pay | Admitting: Urology

## 2017-06-12 ENCOUNTER — Telehealth: Payer: Self-pay | Admitting: *Deleted

## 2017-06-12 DIAGNOSIS — C61 Malignant neoplasm of prostate: Secondary | ICD-10-CM

## 2017-06-12 NOTE — Telephone Encounter (Signed)
THIS PATIENT WILL HAVE A FIRMAGON INJ. @ DR. [name]'S OFFICE IN [name] ON 06-29-17

## 2017-06-14 ENCOUNTER — Telehealth: Payer: Self-pay | Admitting: Medical Oncology

## 2017-06-14 ENCOUNTER — Telehealth: Payer: Self-pay | Admitting: Urology

## 2017-06-14 NOTE — Telephone Encounter (Signed)
[name] and his wife called asking for results of the PET scan. They have not heard from Dr. [name] or Dr. Tammi Klippel. I informed them that I am not able to give results but I will follow up and get back with them. [name] asked that we call her cell [phone].

## 2017-06-14 NOTE — Telephone Encounter (Signed)
I spoke with Freeman Caldron, PA to inform her that patient has not been called with results of PET scan.  I asked her to call his wife at [phone]. I called Mrs. Mcbrearty to inform her Ashlyn/Dr. Tammi Klippel will call her with results. She voiced understanding.

## 2017-06-14 NOTE — Telephone Encounter (Signed)
I called and spoke with [name] regarding the results of [name] PET scan from 06/08/17.  I relayed that while the [name] PET shows a positive external iliac node representing stage IVA disease, definitive radiation is still an option in this setting for curative intent.  However, recommendations regarding optimal therapy can be variable, so, we will defer to Dr. [name] regarding his opinion on this on referral. He is scheduled for a follow up appointment with Dr. [name] to begin ADT on 06/22/17, per [name].  I informed her that we have placed a referral to Dr. [name] for them to meet and discuss treatment options in [name], since this is closer to home and more convenient.  They are still undecided as to whether they will prefer treatment in [name] or [name] but will make their final decision following their meeting with Dr. [name].  At any rate, he will need a follow up appointment with Dr.[name] in early October, for fiducial marker placement, in preparation to begin IMRT the week of Oct. 22nd. I advised that I will forward this information to [name], department secretary, to begin working on coordinating the appointments for: 1. Consult with Dr. [name] (first available) 2. Follow up with Dr. [name] for fiducial markers in October 2018 3. CT SIM (here or [name] pending patient preference) to begin IMRT week of Oct. 22nd, 2018.  They can expect a call from [name] regarding the referral to Dr. [name] in the next week, but advised to call our office to follow up if they have not heard from her in this time frame. -  [name], PA-C

## 2017-06-18 ENCOUNTER — Other Ambulatory Visit: Payer: Self-pay | Admitting: Urology

## 2017-06-18 DIAGNOSIS — C61 Malignant neoplasm of prostate: Secondary | ICD-10-CM

## 2017-06-19 DIAGNOSIS — C61 Malignant neoplasm of prostate: Secondary | ICD-10-CM | POA: Diagnosis not present

## 2017-06-22 ENCOUNTER — Ambulatory Visit (INDEPENDENT_AMBULATORY_CARE_PROVIDER_SITE_OTHER): Payer: Medicare Other | Admitting: Urology

## 2017-06-22 DIAGNOSIS — C775 Secondary and unspecified malignant neoplasm of intrapelvic lymph nodes: Secondary | ICD-10-CM

## 2017-06-22 DIAGNOSIS — C61 Malignant neoplasm of prostate: Secondary | ICD-10-CM | POA: Diagnosis not present

## 2017-06-24 ENCOUNTER — Encounter: Payer: Self-pay | Admitting: Family Medicine

## 2017-06-25 ENCOUNTER — Other Ambulatory Visit: Payer: Self-pay | Admitting: Urology

## 2017-06-25 DIAGNOSIS — C61 Malignant neoplasm of prostate: Secondary | ICD-10-CM

## 2017-07-03 ENCOUNTER — Telehealth: Payer: Self-pay | Admitting: Medical Oncology

## 2017-07-03 ENCOUNTER — Telehealth: Payer: Self-pay | Admitting: "Endocrinology

## 2017-07-03 NOTE — Telephone Encounter (Signed)
Craig is stating that Medicare wont pay for BYETTA 10 MCG PEN 10 MCG/0.04ML SOPN injection and he is asking if Dr. Dorris Fetch would change that medication to something his insurance will pay, please adivse?

## 2017-07-03 NOTE — Telephone Encounter (Signed)
Spoke with [name] as follow up to Prostate [facility]. [name] received his Lupron 06/20/17 and consulted with Dr. [name] in [place]. She states the visit went well and he will be receiving his radiation there since it is closer to home. He has appointment for placement of gold markers and CT simulation. I wished them well and told her I am here if she needs to talk or has questions or concerns. She voiced understanding.

## 2017-07-05 ENCOUNTER — Other Ambulatory Visit: Payer: Self-pay | Admitting: "Endocrinology

## 2017-07-05 NOTE — Telephone Encounter (Signed)
He can stay without Byetta. We will discuss his options next visit.

## 2017-07-05 NOTE — Telephone Encounter (Signed)
Pt notified and agrees. 

## 2017-07-06 DIAGNOSIS — E109 Type 1 diabetes mellitus without complications: Secondary | ICD-10-CM | POA: Diagnosis not present

## 2017-07-10 ENCOUNTER — Ambulatory Visit (INDEPENDENT_AMBULATORY_CARE_PROVIDER_SITE_OTHER): Payer: Medicare Other | Admitting: Family Medicine

## 2017-07-10 ENCOUNTER — Encounter: Payer: Self-pay | Admitting: Family Medicine

## 2017-07-10 VITALS — BP 120/78 | HR 66 | Temp 98.0°F | Resp 14 | Ht 72.0 in | Wt 243.4 lb

## 2017-07-10 DIAGNOSIS — Z89511 Acquired absence of right leg below knee: Secondary | ICD-10-CM | POA: Diagnosis not present

## 2017-07-10 DIAGNOSIS — E1159 Type 2 diabetes mellitus with other circulatory complications: Secondary | ICD-10-CM | POA: Diagnosis not present

## 2017-07-10 DIAGNOSIS — I10 Essential (primary) hypertension: Secondary | ICD-10-CM

## 2017-07-10 DIAGNOSIS — E782 Mixed hyperlipidemia: Secondary | ICD-10-CM | POA: Diagnosis not present

## 2017-07-10 DIAGNOSIS — N184 Chronic kidney disease, stage 4 (severe): Secondary | ICD-10-CM | POA: Diagnosis not present

## 2017-07-10 DIAGNOSIS — R2681 Unsteadiness on feet: Secondary | ICD-10-CM | POA: Insufficient documentation

## 2017-07-10 DIAGNOSIS — Z89612 Acquired absence of left leg above knee: Secondary | ICD-10-CM

## 2017-07-10 NOTE — Assessment & Plan Note (Signed)
Controlled no changes 

## 2017-07-10 NOTE — Progress Notes (Signed)
Subjective:    Patient ID: [name], male    DOB: January 27, 1951, 66 y.o.   MRN: [id number]  Patient presents for 6 month follow up   Pt here to f/u chronic medical problems. He is currently being treated for prostate cancer by urology radiation therapy.  Seen by [facility], [place]  after [facility]   Following with endocrinology for his diabetes , last A1C 6.9%He is off of the Byetta as he cannot afford that his endocrinologist is aware of this. Given a see how he does   Reviewed medications  HTN- controlled with atenolol  Hyperlipidemia- LDL 53 in Jan 2018 on crestor   Getting flu shot at hospital with volunteer program   Concerns today he's not very active he would like to try exercising he is asking for prescription for exercise.     POWER WHEELCHAIR EVALUATION:  He would also like to get a power wheelchair to help with mobility around his home. He currently has bilateral prosthesis from bilateral amputation he does use the prosthesis along with a walker but gets winded very easily and fatigued, he can only walk short distances with this device. He is unable to use a single cane due to his prosthesis.  Power wheelchair will enable him to move around his home with more ease and assist with his activities of daily living.  He is unable to use a cane or walker to meet his mobility needs secondary to debility associated with being bilat amputee, Manual wheelchair also causes discomfort and fatigue of the upper body After requiring a family member to assist.    He is unable to use a motorized scooter, due to his amputee state and occasional use of the prosthesis difficulty getting into position on a scooter with Tiller system in place   Review Of Systems:  GEN- denies fatigue, fever, weight loss,weakness, recent illness HEENT- denies eye drainage, change in vision, nasal discharge, CVS- denies chest pain, palpitations RESP- denies SOB, cough, wheeze ABD-  denies N/V, change in stools, abd pain GU- denies dysuria, hematuria, dribbling, incontinence MSK- denies joint pain, muscle aches, injury Neuro- denies headache, dizziness, syncope, seizure activity       Objective:    BP 120/78   Pulse 66   Temp 98 F (36.7 C) (Oral)   Resp 14   Ht 6' (1.829 m)   Wt 243 lb 6.4 oz (110.4 kg)   SpO2 98%   BMI 33.01 kg/m  GEN- NAD, alert and oriented x3 HEENT- PERRL, EOMI, non injected sclera, pink conjunctiva, MMM, oropharynx clear Neck- Supple, no thyromegaly CVS- RRR, no murmur RESP-CTAB ABD-NABS,soft,NT,ND EXT-  bilat amputee/prosthesis Pulses- Radial 2+ MSK- Good ROM upper ext, Neuro- normal grasp hands bilat, strength 5/5 Upper extremeties biceps/triceps, DTR symmetric , Lower ext Hip flexors 4+/5 Bilat   CNII-XII in tact       Assessment & Plan:      Problem List Items Addressed This Visit      Unprioritized   History of left above knee amputation ([place])   Status post below knee amputation of right lower extremity (HCC)   DM type 2 causing vascular disease ([place])   Mixed hyperlipidemia   Gait instability    Gait instability secondary to bilateral amputee. I think he would benefit from power wheelchair. He is motivated to use wheelchair. Home is wheelchair accessible and allows for maneuvering within the home. Power wheelchair  will allow him to improve his activities of  daily living  He is safely able to transfer to a power operated wheelchair. He has a mental capabilities and physical capabilities are sufficient for safe mobility using the power operated wheelchair within his home.  Use a power [place] were significantly improve his ability but dissipated and mobility-related ADLs and he will use with in the home. He has not expressed any unwillingness to use a power Wheelchair      Essential hypertension - Primary    Controlled no changes.      CKD (chronic kidney disease) (Chronic)    Recent labs reviewed at  baseline         Note: This dictation was prepared with Dragon dictation along with smaller phrase technology. Any transcriptional errors that result from this process are unintentional.

## 2017-07-10 NOTE — Assessment & Plan Note (Addendum)
Gait instability secondary to bilateral amputee. I think he would benefit from power wheelchair. He is motivated to use wheelchair. Home is wheelchair accessible and allows for maneuvering within the home. Power wheelchair  will allow him to improve his activities of daily living  He is safely able to transfer to a power operated wheelchair. He has a mental capabilities and physical capabilities are sufficient for safe mobility using the power operated wheelchair within his home.  Use a power [place] were significantly improve his ability but dissipated and mobility-related ADLs and he will use with in the home. He has not expressed any unwillingness to use a power Wheelchair

## 2017-07-10 NOTE — Patient Instructions (Addendum)
F/U 6 months for PHYSICAL        Power Chair - call and see what company  Prescription for exercise

## 2017-07-10 NOTE — Assessment & Plan Note (Signed)
Recent labs reviewed at baseline

## 2017-07-15 ENCOUNTER — Other Ambulatory Visit: Payer: Self-pay | Admitting: Family Medicine

## 2017-07-21 ENCOUNTER — Other Ambulatory Visit: Payer: Self-pay | Admitting: "Endocrinology

## 2017-07-24 DIAGNOSIS — C61 Malignant neoplasm of prostate: Secondary | ICD-10-CM | POA: Diagnosis not present

## 2017-07-27 ENCOUNTER — Ambulatory Visit (HOSPITAL_COMMUNITY)
Admission: RE | Admit: 2017-07-27 | Discharge: 2017-07-27 | Disposition: A | Discharge status: Home / Self Care | Payer: Medicare Other | Source: Ambulatory Visit | Attending: Urology | Admitting: Urology

## 2017-07-27 DIAGNOSIS — C61 Malignant neoplasm of prostate: Secondary | ICD-10-CM | POA: Diagnosis not present

## 2017-07-27 MED ORDER — LIDOCAINE HCL (PF) 2 % IJ SOLN
INTRAMUSCULAR | Status: AC
  Administered 2017-07-27: 10 mL
  Filled 2017-07-27: qty 10

## 2017-07-27 MED ORDER — CEFTRIAXONE SODIUM 1 G IJ SOLR
INTRAMUSCULAR | Status: AC
  Administered 2017-07-27: 1 g via INTRAMUSCULAR
  Filled 2017-07-27: qty 10

## 2017-07-27 MED ORDER — CEFTRIAXONE SODIUM 1 G IJ SOLR
1.0000 g | Freq: Once | INTRAMUSCULAR | Status: AC
  Administered 2017-07-27: 1 g via INTRAMUSCULAR

## 2017-07-27 MED ORDER — LIDOCAINE HCL (PF) 1 % IJ SOLN
INTRAMUSCULAR | Status: AC
  Administered 2017-07-27: 2.1 mL
  Filled 2017-07-27: qty 5

## 2017-07-27 MED ORDER — LIDOCAINE HCL (PF) 1 % IJ SOLN
2.1000 mL | Freq: Once | INTRAMUSCULAR | Status: AC
  Administered 2017-07-27: 2.1 mL

## 2017-07-27 NOTE — Sedation Documentation (Signed)
Tolerated procedure with no difficulty.

## 2017-07-29 ENCOUNTER — Other Ambulatory Visit: Payer: Self-pay | Admitting: "Endocrinology

## 2017-08-03 ENCOUNTER — Ambulatory Visit (INDEPENDENT_AMBULATORY_CARE_PROVIDER_SITE_OTHER): Payer: Medicare Other | Admitting: Urology

## 2017-08-03 DIAGNOSIS — C61 Malignant neoplasm of prostate: Secondary | ICD-10-CM

## 2017-08-03 DIAGNOSIS — E349 Endocrine disorder, unspecified: Secondary | ICD-10-CM | POA: Diagnosis not present

## 2017-08-03 DIAGNOSIS — C775 Secondary and unspecified malignant neoplasm of intrapelvic lymph nodes: Secondary | ICD-10-CM

## 2017-08-14 DIAGNOSIS — C61 Malignant neoplasm of prostate: Secondary | ICD-10-CM | POA: Diagnosis not present

## 2017-08-16 DIAGNOSIS — C61 Malignant neoplasm of prostate: Secondary | ICD-10-CM | POA: Diagnosis not present

## 2017-08-22 ENCOUNTER — Telehealth: Payer: Self-pay | Admitting: *Deleted

## 2017-08-22 NOTE — Telephone Encounter (Signed)
Received call from patient.  Inquired as to status to order for power operated wheelchair.   Advised that order has been sent to [facility] and they are filing information through insurance and will contact patient directly when orders have been approved.

## 2017-08-27 DIAGNOSIS — C61 Malignant neoplasm of prostate: Secondary | ICD-10-CM | POA: Diagnosis not present

## 2017-08-28 DIAGNOSIS — C61 Malignant neoplasm of prostate: Secondary | ICD-10-CM | POA: Diagnosis not present

## 2017-08-29 DIAGNOSIS — C61 Malignant neoplasm of prostate: Secondary | ICD-10-CM | POA: Diagnosis not present

## 2017-08-30 DIAGNOSIS — C61 Malignant neoplasm of prostate: Secondary | ICD-10-CM | POA: Diagnosis not present

## 2017-08-31 DIAGNOSIS — C61 Malignant neoplasm of prostate: Secondary | ICD-10-CM | POA: Diagnosis not present

## 2017-09-03 DIAGNOSIS — C61 Malignant neoplasm of prostate: Secondary | ICD-10-CM | POA: Diagnosis not present

## 2017-09-04 DIAGNOSIS — C61 Malignant neoplasm of prostate: Secondary | ICD-10-CM | POA: Diagnosis not present

## 2017-09-05 DIAGNOSIS — C61 Malignant neoplasm of prostate: Secondary | ICD-10-CM | POA: Diagnosis not present

## 2017-09-06 DIAGNOSIS — C61 Malignant neoplasm of prostate: Secondary | ICD-10-CM | POA: Diagnosis not present

## 2017-09-12 DIAGNOSIS — E1159 Type 2 diabetes mellitus with other circulatory complications: Secondary | ICD-10-CM | POA: Diagnosis not present

## 2017-09-12 DIAGNOSIS — C61 Malignant neoplasm of prostate: Secondary | ICD-10-CM | POA: Diagnosis not present

## 2017-09-13 DIAGNOSIS — C61 Malignant neoplasm of prostate: Secondary | ICD-10-CM | POA: Diagnosis not present

## 2017-09-13 LAB — RENAL FUNCTION PANEL
ALBUMIN MSPROF: 3.5 g/dL — AB (ref 3.6–5.1)
BUN/Creatinine Ratio: 19 (calc) (ref 6–22)
BUN: 26 mg/dL — AB (ref 7–25)
CALCIUM: 9.1 mg/dL (ref 8.6–10.3)
CO2: 27 mmol/L (ref 20–32)
Chloride: 104 mmol/L (ref 98–110)
Creat: 1.4 mg/dL — ABNORMAL HIGH (ref 0.70–1.25)
Glucose, Bld: 155 mg/dL — ABNORMAL HIGH (ref 65–139)
Phosphorus: 2.8 mg/dL (ref 2.1–4.3)
Potassium: 4.2 mmol/L (ref 3.5–5.3)
SODIUM: 141 mmol/L (ref 135–146)

## 2017-09-13 LAB — HEMOGLOBIN A1C
EAG (MMOL/L): 9.5 (calc)
HEMOGLOBIN A1C: 7.6 %{Hb} — AB (ref <5.7)
MEAN PLASMA GLUCOSE: 171 (calc)

## 2017-09-14 DIAGNOSIS — C61 Malignant neoplasm of prostate: Secondary | ICD-10-CM | POA: Diagnosis not present

## 2017-09-17 DIAGNOSIS — C61 Malignant neoplasm of prostate: Secondary | ICD-10-CM | POA: Diagnosis not present

## 2017-09-18 ENCOUNTER — Encounter: Payer: Self-pay | Admitting: "Endocrinology

## 2017-09-18 ENCOUNTER — Ambulatory Visit (INDEPENDENT_AMBULATORY_CARE_PROVIDER_SITE_OTHER): Payer: Medicare Other | Admitting: "Endocrinology

## 2017-09-18 VITALS — BP 125/75 | HR 70 | Ht 72.0 in | Wt 245.0 lb

## 2017-09-18 DIAGNOSIS — E782 Mixed hyperlipidemia: Secondary | ICD-10-CM

## 2017-09-18 DIAGNOSIS — C61 Malignant neoplasm of prostate: Secondary | ICD-10-CM | POA: Diagnosis not present

## 2017-09-18 DIAGNOSIS — I10 Essential (primary) hypertension: Secondary | ICD-10-CM

## 2017-09-18 DIAGNOSIS — E1159 Type 2 diabetes mellitus with other circulatory complications: Secondary | ICD-10-CM

## 2017-09-18 MED ORDER — DULAGLUTIDE 1.5 MG/0.5ML ~~LOC~~ SOAJ: 1.5000 mg | SUBCUTANEOUS | 2 refills | Status: DC

## 2017-09-18 MED ORDER — METFORMIN HCL 500 MG PO TABS: 500.0000 mg | ORAL_TABLET | Freq: Two times a day (BID) | ORAL | 0 refills | Status: DC

## 2017-09-18 MED ORDER — INSULIN DEGLUDEC 100 UNIT/ML ~~LOC~~ SOPN: 30.0000 [IU] | PEN_INJECTOR | Freq: Every day | SUBCUTANEOUS | 2 refills | Status: DC

## 2017-09-18 NOTE — Patient Instructions (Signed)

## 2017-09-18 NOTE — Progress Notes (Signed)
Subjective:    Patient ID: [name], male    DOB: 10-Dec-1950, PCP Alycia Rossetti, MD   Past Medical History:  Diagnosis Date  . Arthritis   . Cerebrovascular disease    MRI shows Right carotid inferior cavernours narrowing 75% and  50-75% stenosis of Cavernous and supraclinoi right side  . CKD (chronic kidney disease) 06/28/2014   Sees Dr Florene Glen  . Hyperlipidemia   . Hypertension   . Myocardial infarction ([place])    "mild" heart attack  . Necrosis ([place])    #2 nail   . Pneumonia    as a child  . Poor circulation of extremity   . Prostate cancer ([place])   . Sleep apnea    uses cpap, getting a new one  . Type 2 Diabetes mellitus    Type 2   Past Surgical History:  Procedure Laterality Date  . ABOVE KNEE LEG AMPUTATION Left 2004  . AMPUTATION Right 04/28/2015   Procedure: AMPUTATION RAY, RIGHT 5TH TOE;  Surgeon: Marybelle Killings, MD;  Location: [facility];  Service: Orthopedics;  Laterality: Right;  . AMPUTATION Right 05/10/2015   Procedure: Right Below Knee Amputation;  Surgeon: Marybelle Killings, MD;  Location: [facility];  Service: Orthopedics;  Laterality: Right;  . CATARACT EXTRACTION W/PHACO  09/05/2012   Procedure: CATARACT EXTRACTION PHACO AND INTRAOCULAR LENS PLACEMENT (IOC);  Surgeon: Tonny Branch, MD;  Location: [facility];  Service: Ophthalmology;  Laterality: Left;  CDE=5.45  . CATARACT EXTRACTION W/PHACO  10/03/2012   Procedure: CATARACT EXTRACTION PHACO AND INTRAOCULAR LENS PLACEMENT (IOC);  Surgeon: Tonny Branch, MD;  Location: [facility];  Service: Ophthalmology;  Laterality: Right;  CDE: 12.31  . COLONOSCOPY     Social History   Socioeconomic History  . Marital status: Married    Spouse name: None  . Number of children: 2  . Years of education: college  . Highest education level: None  Social Needs  . Financial resource strain: None  . Food insecurity - worry: None  . Food insecurity - inability: None  . Transportation needs - medical: None  . Transportation needs - non-medical:  None  Occupational History  . Occupation: Mining engineer: [employer]  Tobacco Use  . Smoking status: Never Smoker  . Smokeless tobacco: Never Used  Substance and Sexual Activity  . Alcohol use: No  . Drug use: No  . Sexual activity: No  Other Topics Concern  . None  Social History Narrative   Patient lives with his wife    Patient right handed   Patient drinks caffine on occ.   Outpatient Encounter Medications as of 09/18/2017  Medication Sig  . acetaminophen (TYLENOL) 325 MG tablet Take 1-2 tablets (325-650 mg total) by mouth every 4 (four) hours as needed for mild pain.  Marland Kitchen atenolol (TENORMIN) 25 MG tablet TAKE 1 TABLET BY MOUTH ONCE DAILY  . B-D UF III MINI PEN NEEDLES 31G X 5 MM MISC USE FOUR TIMES DAILY AS DIRECTED  . Calcium Carb-Cholecalciferol (CALCIUM PLUS VITAMIN D3) 600-500 MG-UNIT CAPS Take 1 capsule by mouth daily.   . clopidogrel (PLAVIX) 75 MG tablet TAKE 1 TABLET BY MOUTH EVERY DAY  . Dulaglutide (TRULICITY) 1.5 XI/3.3AS SOPN Inject 1.5 mg into the skin once a week.  . furosemide (LASIX) 40 MG tablet TAKE 1 TABLET (40 MG TOTAL) BY MOUTH DAILY.  Marland Kitchen glucose blood (BAYER CONTOUR TEST) test strip Use as instructed bid. E11.65  .  insulin degludec (TRESIBA FLEXTOUCH) 100 UNIT/ML SOPN FlexTouch Pen Inject 0.3 mLs (30 Units total) into the skin daily at 10 pm.  . metFORMIN (GLUCOPHAGE) 500 MG tablet Take 1 tablet (500 mg total) by mouth 2 (two) times daily with a meal.  . rosuvastatin (CRESTOR) 20 MG tablet Take 1 tablet (20 mg total) by mouth daily.  . [DISCONTINUED] metFORMIN (GLUCOPHAGE) 500 MG tablet TAKE 1 TABLET (500 MG TOTAL) BY MOUTH 2 (TWO) TIMES DAILY WITH A MEAL.  . [DISCONTINUED] metFORMIN (GLUCOPHAGE) 500 MG tablet TAKE 1 TABLET (500 MG TOTAL) BY MOUTH 2 (TWO) TIMES DAILY WITH A MEAL.  . [DISCONTINUED] TOUJEO SOLOSTAR 300 UNIT/ML SOPN INJECT 20 UNITS INTO SKIN AT BEDTIME   No facility-administered encounter medications on file as of 09/18/2017.     ALLERGIES: Allergies  Allergen Reactions  . Zolpidem Tartrate Other (See Comments)    disorientation    VACCINATION STATUS: Immunization History  Administered Date(s) Administered  . Influenza Split 06/30/2013  . Influenza-Unspecified 07/30/2015  . Pneumococcal Polysaccharide-23 07/31/2007  . Td 10/30/2002  . Tdap 06/19/2016  . Zoster 06/19/2016    Diabetes  He presents for his follow-up diabetic visit. He has type 2 diabetes mellitus. Onset time: He was diagnosed at approximate age of 84 years. His disease course has been worsening. There are no hypoglycemic associated symptoms. Pertinent negatives for hypoglycemia include no confusion, headaches, pallor or seizures. There are no diabetic associated symptoms. Pertinent negatives for diabetes include no chest pain, no fatigue, no polydipsia, no polyphagia, no polyuria and no weakness. There are no hypoglycemic complications. Symptoms are worsening. Diabetic complications include PVD. (Status post bilateral lower extremity amputations. He is wheelchair-bound, awaiting for leg prosthetics.) Risk factors for coronary artery disease include diabetes mellitus, dyslipidemia, hypertension, male sex, obesity, sedentary lifestyle and tobacco exposure. Current diabetic treatment includes insulin injections (He could not afford Byetta, [place]je[place], due to a gap in his insurance, currently on the on metformin 500 minute grams by mouth twice a day.). He is compliant with treatment most of the time. His weight is stable. He is following a diabetic diet. When asked about meal planning, he reported none. He has had a previous visit with a dietitian. He never participates in exercise. His home blood glucose trend is decreasing steadily. His breakfast blood glucose range is generally 140-180 mg/dl. His bedtime blood glucose range is generally 140-180 mg/dl. His overall blood glucose range is 140-180 mg/dl. An ACE inhibitor/angiotensin II receptor blocker is being  taken.  Hyperlipidemia  This is a chronic problem. The current episode started more than 1 year ago. The problem is controlled. Pertinent negatives include no chest pain, myalgias or shortness of breath. Current antihyperlipidemic treatment includes statins. Risk factors for coronary artery disease include dyslipidemia, diabetes mellitus, hypertension, male sex, obesity and a sedentary lifestyle.  Hypertension  This is a chronic problem. The current episode started more than 1 year ago. Pertinent negatives include no chest pain, headaches, neck pain, palpitations or shortness of breath. Risk factors for coronary artery disease include diabetes mellitus, dyslipidemia, male gender, obesity, sedentary lifestyle and smoking/tobacco exposure. Hypertensive end-organ damage includes PVD.    Review of Systems  Constitutional: Negative for fatigue and unexpected weight change.  HENT: Negative for dental problem, mouth sores and trouble swallowing.   Eyes: Negative for visual disturbance.  Respiratory: Negative for cough, choking, chest tightness, shortness of breath and wheezing.   Cardiovascular: Negative for chest pain, palpitations and leg swelling.  Gastrointestinal: Negative for abdominal distention, abdominal  pain, constipation, diarrhea, nausea and vomiting.  Endocrine: Negative for polydipsia, polyphagia and polyuria.  Genitourinary: Negative for dysuria, flank pain, hematuria and urgency.  Musculoskeletal: Positive for gait problem. Negative for back pain, myalgias and neck pain.       He now walks with bilateral leg prostheses and crutches.  Skin: Negative for pallor, rash and wound.  Neurological: Negative for seizures, syncope, weakness, numbness and headaches.  Psychiatric/Behavioral: Negative.  Negative for confusion, dysphoric mood and suicidal ideas.    Objective:    BP 125/75   Pulse 70   Ht 6' (1.829 m)   Wt 245 lb (111.1 kg)   BMI 33.23 kg/m   Wt Readings from Last 3  Encounters:  09/18/17 245 lb (111.1 kg)  07/10/17 243 lb 6.4 oz (110.4 kg)  06/08/17 247 lb (112 kg)    Physical Exam  Constitutional: He is oriented to person, place, and time. He appears well-developed and well-nourished. He is cooperative. No distress.  HENT:  Head: Normocephalic and atraumatic.  Eyes: EOM are normal.  Neck: Normal range of motion. Neck supple. No tracheal deviation present. No thyromegaly present.  Cardiovascular: Normal rate, S1 normal, S2 normal and normal heart sounds. Exam reveals no gallop.  No murmur heard. Pulses:      Dorsalis pedis pulses are 1+ on the right side, and 1+ on the left side.       Posterior tibial pulses are 1+ on the right side, and 1+ on the left side.  Pulmonary/Chest: Breath sounds normal. No respiratory distress. He has no wheezes.  Abdominal: Soft. Bowel sounds are normal. He exhibits no distension. There is no tenderness. There is no guarding and no CVA tenderness.  Musculoskeletal:       Right shoulder: He exhibits no swelling and no deformity.  He now walks with bilateral leg prostheses and crutches.  Neurological: He is alert and oriented to person, place, and time. He has normal strength and normal reflexes. No cranial nerve deficit or sensory deficit. Gait normal.  Skin: Skin is warm and dry. No rash noted. No cyanosis. Nails show no clubbing.  Psychiatric: He has a normal mood and affect. His speech is normal and behavior is normal. Judgment and thought content normal. Cognition and memory are normal.    Results for orders placed or performed in visit on 05/17/17  Renal function panel  Result Value Ref Range   Glucose, Bld 155 (H) 65 - 139 mg/dL   BUN 26 (H) 7 - 25 mg/dL   Creat 1.40 (H) 0.70 - 1.25 mg/dL   BUN/Creatinine Ratio 19 6 - 22 (calc)   Sodium 141 135 - 146 mmol/L   Potassium 4.2 3.5 - 5.3 mmol/L   Chloride 104 98 - 110 mmol/L   CO2 27 20 - 32 mmol/L   Calcium 9.1 8.6 - 10.3 mg/dL   Phosphorus 2.8 2.1 - 4.3 mg/dL    Albumin 3.5 (L) 3.6 - 5.1 g/dL  Hemoglobin A1c  Result Value Ref Range   Hgb A1c MFr Bld 7.6 (H) <5.7 % of total Hgb   Mean Plasma Glucose 171 (calc)   eAG (mmol/L) 9.5 (calc)   Complete Blood Count (Most recent): Lab Results  Component Value Date   WBC 7.6 05/20/2015   HGB 8.3 (L) 05/20/2015   HCT 25.4 (L) 05/20/2015   MCV 87.3 05/20/2015   PLT 380 05/20/2015   Chemistry (most recent): Lab Results  Component Value Date   NA 141 09/12/2017   K  4.2 09/12/2017   CL 104 09/12/2017   CO2 27 09/12/2017   BUN 26 (H) 09/12/2017   CREATININE 1.40 (H) 09/12/2017   Diabetic Labs (most recent): Lab Results  Component Value Date   HGBA1C 7.6 (H) 09/12/2017   HGBA1C 6.9 (H) 05/15/2017   HGBA1C 6.5 (H) 01/23/2017   Lipid profile (most recent): Lab Results  Component Value Date   TRIG 71 10/31/2016   CHOL 106 10/31/2016    Assessment & Plan:   1. Type 2 diabetes mellitus with other circulatory complications (HCC)  -His  diabetes is  complicated by extensive peripheral arterial disease with bilateral lower extremity amputations and patient remains at a high risk for more acute and chronic complications of diabetes which include CAD, CVA, CKD, retinopathy, and neuropathy. These are all discussed in detail with the patient.  Patient came with controlled  fasting glucose profile, slightly above target postprandial blood glucose profile. He is A1c of 7.6% increasing from 6.9%.    Recent labs reviewed, showing stage 2 CKD.  - I have re-counseled the patient on diet management and weight loss  by adopting a carbohydrate restricted / protein rich  Diet.  -  Suggestion is made for him to avoid simple carbohydrates  from his diet including Cakes, Sweet Desserts / Pastries, Ice Cream, Soda (diet and regular), Sweet Tea, Candies, Chips, Cookies, Store Bought Juices, Alcohol in Excess of  1-2 drinks a day, Artificial Sweeteners, and "Sugar-free" Products. This will help patient to have  stable blood glucose profile and potentially avoid unintended weight gain.   - Patient is advised to stick to a routine mealtimes to eat 3 meals  a day and avoid unnecessary snacks ( to snack only to correct hypoglycemia).   - I have approached patient with the following individualized plan to manage diabetes and patient agrees. - Based on his insurance review, Tyler Aas is preferred over  [facility].  I discussed and prescribed  Tresiba 30 units daily at bedtime associated with with monitoring of blood glucose every day before breakfast.  - Trulicity  is preferred over Byetta. I discussed and initiated a prescription for Trulicity 1.5 mg subcutaneously weekly.  - He has improving stage  2-3 renal insufficiency, not a candidate for full dose metformin. However he will continue to benefit from metformin therapy with appropriate hydration.  - I will continue metformin 500 mg by mouth twice a day.   - Patient specific target  for A1c; LDL, HDL, Triglycerides, and  Waist Circumference were discussed in detail.  2) BP/HTN: Controlled. Continue current medications including ACEI/ARB. 3) Lipids/HPL:  continue statins. 4)  Weight/Diet: CDE consult in progress, exercise, and carbohydrates information provided.  5) Chronic Care/Health Maintenance:  -Patient is on ACEI/ARB and Statin medications and encouraged to continue to follow up with Ophthalmology, urology given his recent diagnosis of prostate cancer, Podiatrist at least yearly or according to recommendations, and advised to  stay away from smoking. I have recommended yearly flu vaccine and pneumonia vaccination at least every 5 years; moderate intensity exercise for up to 150 minutes weekly; and  sleep for at least 7 hours a day.  - I advised patient to maintain close follow up with Alycia Rossetti, MD for primary care needs.  - Time spent with the patient: 25 min, of which >50% was spent in reviewing his sugar logs , discussing his hypo- and  hyper-glycemic episodes, reviewing his current and  previous labs and insulin doses and developing a plan to avoid  hypo- and hyper-glycemia.    Follow up plan: -Return in about 3 months (around 12/19/2017) for meter, and logs, follow up with pre-visit labs, meter, and logs.  Glade Lloyd, MD Phone: [phone]  Fax: [phone]  -  This note was partially dictated with voice recognition software. Similar sounding words can be transcribed inadequately or may not  be corrected upon review.  09/18/2017, 1:29 PM

## 2017-09-19 DIAGNOSIS — C61 Malignant neoplasm of prostate: Secondary | ICD-10-CM | POA: Diagnosis not present

## 2017-09-24 DIAGNOSIS — C61 Malignant neoplasm of prostate: Secondary | ICD-10-CM | POA: Diagnosis not present

## 2017-09-25 DIAGNOSIS — C61 Malignant neoplasm of prostate: Secondary | ICD-10-CM | POA: Diagnosis not present

## 2017-09-26 DIAGNOSIS — C61 Malignant neoplasm of prostate: Secondary | ICD-10-CM | POA: Diagnosis not present

## 2017-09-27 DIAGNOSIS — C61 Malignant neoplasm of prostate: Secondary | ICD-10-CM | POA: Diagnosis not present

## 2017-09-28 DIAGNOSIS — C61 Malignant neoplasm of prostate: Secondary | ICD-10-CM | POA: Diagnosis not present

## 2017-10-01 DIAGNOSIS — C61 Malignant neoplasm of prostate: Secondary | ICD-10-CM | POA: Diagnosis not present

## 2017-10-02 DIAGNOSIS — C61 Malignant neoplasm of prostate: Secondary | ICD-10-CM | POA: Diagnosis not present

## 2017-10-03 DIAGNOSIS — C61 Malignant neoplasm of prostate: Secondary | ICD-10-CM | POA: Diagnosis not present

## 2017-10-04 DIAGNOSIS — C61 Malignant neoplasm of prostate: Secondary | ICD-10-CM | POA: Diagnosis not present

## 2017-10-05 DIAGNOSIS — C61 Malignant neoplasm of prostate: Secondary | ICD-10-CM | POA: Diagnosis not present

## 2017-10-09 DIAGNOSIS — C61 Malignant neoplasm of prostate: Secondary | ICD-10-CM | POA: Diagnosis not present

## 2017-10-10 DIAGNOSIS — C61 Malignant neoplasm of prostate: Secondary | ICD-10-CM | POA: Diagnosis not present

## 2017-10-11 DIAGNOSIS — C61 Malignant neoplasm of prostate: Secondary | ICD-10-CM | POA: Diagnosis not present

## 2017-10-12 DIAGNOSIS — C61 Malignant neoplasm of prostate: Secondary | ICD-10-CM | POA: Diagnosis not present

## 2017-10-13 ENCOUNTER — Other Ambulatory Visit: Payer: Self-pay | Admitting: Family Medicine

## 2017-10-15 DIAGNOSIS — C61 Malignant neoplasm of prostate: Secondary | ICD-10-CM | POA: Diagnosis not present

## 2017-10-16 DIAGNOSIS — C61 Malignant neoplasm of prostate: Secondary | ICD-10-CM | POA: Diagnosis not present

## 2017-10-17 DIAGNOSIS — C61 Malignant neoplasm of prostate: Secondary | ICD-10-CM | POA: Diagnosis not present

## 2017-10-18 DIAGNOSIS — C61 Malignant neoplasm of prostate: Secondary | ICD-10-CM | POA: Diagnosis not present

## 2017-10-19 DIAGNOSIS — C61 Malignant neoplasm of prostate: Secondary | ICD-10-CM | POA: Diagnosis not present

## 2017-10-24 DIAGNOSIS — C61 Malignant neoplasm of prostate: Secondary | ICD-10-CM | POA: Diagnosis not present

## 2017-10-25 DIAGNOSIS — C61 Malignant neoplasm of prostate: Secondary | ICD-10-CM | POA: Diagnosis not present

## 2017-10-26 DIAGNOSIS — C61 Malignant neoplasm of prostate: Secondary | ICD-10-CM | POA: Diagnosis not present

## 2017-11-12 ENCOUNTER — Telehealth: Payer: Self-pay | Admitting: *Deleted

## 2017-11-12 MED ORDER — ATORVASTATIN CALCIUM 80 MG PO TABS: 80.0000 mg | ORAL_TABLET | Freq: Every day | ORAL | 3 refills | Status: DC

## 2017-11-12 NOTE — Telephone Encounter (Signed)
Change to Lipitor 80mg  at bedtime

## 2017-11-12 NOTE — Telephone Encounter (Signed)
Received fax requesting refill on Crestor.   Patient currently paying $141 out of pocket. Requested to change to alternative Lipitor with $0 co-pay.   MD please advise.

## 2017-11-12 NOTE — Telephone Encounter (Signed)
Call placed to patient and patient made aware.   Prescription sent to pharmacy.  

## 2017-11-12 NOTE — Telephone Encounter (Signed)
Not sure if he is confusing something else, Lipitor and cholesterol drugs go through the liver, not the kidneys He can take the lipitor

## 2017-11-12 NOTE — Telephone Encounter (Signed)
Call placed to patient to make aware.   Reports that he thinks he has been on Lipitor in the past. States that he believes Dr. Florene Glen, his nephrologist stopped Lipitor D/T worsening kidney function.   MD please advise.

## 2017-11-28 DIAGNOSIS — C61 Malignant neoplasm of prostate: Secondary | ICD-10-CM | POA: Diagnosis not present

## 2017-12-12 ENCOUNTER — Other Ambulatory Visit: Payer: Self-pay | Admitting: Family Medicine

## 2017-12-18 DIAGNOSIS — E1159 Type 2 diabetes mellitus with other circulatory complications: Secondary | ICD-10-CM | POA: Diagnosis not present

## 2017-12-19 LAB — COMPLETE METABOLIC PANEL WITH GFR
AG RATIO: 1.3 (calc) (ref 1.0–2.5)
ALBUMIN MSPROF: 3.5 g/dL — AB (ref 3.6–5.1)
ALT: 20 U/L (ref 9–46)
AST: 18 U/L (ref 10–35)
Alkaline phosphatase (APISO): 76 U/L (ref 40–115)
BILIRUBIN TOTAL: 0.4 mg/dL (ref 0.2–1.2)
BUN / CREAT RATIO: 21 (calc) (ref 6–22)
BUN: 32 mg/dL — ABNORMAL HIGH (ref 7–25)
CO2: 24 mmol/L (ref 20–32)
Calcium: 9.1 mg/dL (ref 8.6–10.3)
Chloride: 105 mmol/L (ref 98–110)
Creat: 1.54 mg/dL — ABNORMAL HIGH (ref 0.70–1.25)
GFR, EST AFRICAN AMERICAN: 54 mL/min/{1.73_m2} — AB (ref >=60)
GFR, Est Non African American: 47 mL/min/{1.73_m2} — ABNORMAL LOW (ref >=60)
Globulin: 2.7 g/dL (calc) (ref 1.9–3.7)
Glucose, Bld: 116 mg/dL (ref 65–139)
POTASSIUM: 4.1 mmol/L (ref 3.5–5.3)
SODIUM: 142 mmol/L (ref 135–146)
TOTAL PROTEIN: 6.2 g/dL (ref 6.1–8.1)

## 2017-12-19 LAB — HEMOGLOBIN A1C
EAG (MMOL/L): 7.7 (calc)
Hgb A1c MFr Bld: 6.5 % of total Hgb — ABNORMAL HIGH (ref <5.7)
Mean Plasma Glucose: 140 (calc)

## 2017-12-25 ENCOUNTER — Encounter: Payer: Self-pay | Admitting: "Endocrinology

## 2017-12-25 ENCOUNTER — Ambulatory Visit (INDEPENDENT_AMBULATORY_CARE_PROVIDER_SITE_OTHER): Payer: Medicare Other | Admitting: "Endocrinology

## 2017-12-25 VITALS — BP 131/83 | HR 73 | Ht 72.0 in | Wt 245.0 lb

## 2017-12-25 DIAGNOSIS — E1159 Type 2 diabetes mellitus with other circulatory complications: Secondary | ICD-10-CM

## 2017-12-25 DIAGNOSIS — I10 Essential (primary) hypertension: Secondary | ICD-10-CM

## 2017-12-25 DIAGNOSIS — E782 Mixed hyperlipidemia: Secondary | ICD-10-CM | POA: Diagnosis not present

## 2017-12-25 NOTE — Patient Instructions (Signed)

## 2017-12-25 NOTE — Progress Notes (Signed)
Subjective:    Patient ID: [name], male    DOB: 1951-10-21, PCP Alycia Rossetti, MD   Past Medical History:  Diagnosis Date  . Arthritis   . Cerebrovascular disease    MRI shows Right carotid inferior cavernours narrowing 75% and  50-75% stenosis of Cavernous and supraclinoi right side  . CKD (chronic kidney disease) 06/28/2014   Sees Dr Florene Glen  . Hyperlipidemia   . Hypertension   . Myocardial infarction ([place])    "mild" heart attack  . Necrosis ([place])    #2 nail   . Pneumonia    as a child  . Poor circulation of extremity   . Prostate cancer ([place])   . Sleep apnea    uses cpap, getting a new one  . Type 2 Diabetes mellitus    Type 2   Past Surgical History:  Procedure Laterality Date  . ABOVE KNEE LEG AMPUTATION Left 2004  . AMPUTATION Right 04/28/2015   Procedure: AMPUTATION RAY, RIGHT 5TH TOE;  Surgeon: Marybelle Killings, MD;  Location: [facility];  Service: Orthopedics;  Laterality: Right;  . AMPUTATION Right 05/10/2015   Procedure: Right Below Knee Amputation;  Surgeon: Marybelle Killings, MD;  Location: [facility];  Service: Orthopedics;  Laterality: Right;  . CATARACT EXTRACTION W/PHACO  09/05/2012   Procedure: CATARACT EXTRACTION PHACO AND INTRAOCULAR LENS PLACEMENT (IOC);  Surgeon: Tonny Branch, MD;  Location: [facility];  Service: Ophthalmology;  Laterality: Left;  CDE=5.45  . CATARACT EXTRACTION W/PHACO  10/03/2012   Procedure: CATARACT EXTRACTION PHACO AND INTRAOCULAR LENS PLACEMENT (IOC);  Surgeon: Tonny Branch, MD;  Location: [facility];  Service: Ophthalmology;  Laterality: Right;  CDE: 12.31  . COLONOSCOPY     Social History   Socioeconomic History  . Marital status: Married    Spouse name: None  . Number of children: 2  . Years of education: college  . Highest education level: None  Social Needs  . Financial resource strain: None  . Food insecurity - worry: None  . Food insecurity - inability: None  . Transportation needs - medical: None  . Transportation needs - non-medical:  None  Occupational History  . Occupation: Mining engineer: [employer]  Tobacco Use  . Smoking status: Never Smoker  . Smokeless tobacco: Never Used  Substance and Sexual Activity  . Alcohol use: No  . Drug use: No  . Sexual activity: No  Other Topics Concern  . None  Social History Narrative   Patient lives with his wife    Patient right handed   Patient drinks caffine on occ.   Outpatient Encounter Medications as of 12/25/2017  Medication Sig  . acetaminophen (TYLENOL) 325 MG tablet Take 1-2 tablets (325-650 mg total) by mouth every 4 (four) hours as needed for mild pain.  Marland Kitchen atenolol (TENORMIN) 25 MG tablet TAKE 1 TABLET BY MOUTH ONCE DAILY  . atorvastatin (LIPITOR) 80 MG tablet Take 1 tablet (80 mg total) by mouth daily.  . B-D UF III MINI PEN NEEDLES 31G X 5 MM MISC USE FOUR TIMES DAILY AS DIRECTED  . Calcium Carb-Cholecalciferol (CALCIUM PLUS VITAMIN D3) 600-500 MG-UNIT CAPS Take 1 capsule by mouth daily.   . clopidogrel (PLAVIX) 75 MG tablet TAKE 1 TABLET BY MOUTH EVERY DAY  . Dulaglutide (TRULICITY) 1.5 IR/5.1OA SOPN Inject 1.5 mg into the skin once a week.  . furosemide (LASIX) 40 MG tablet TAKE 1 TABLET BY MOUTH ONCE DAILY  .  glucose blood (BAYER CONTOUR TEST) test strip Use as instructed bid. E11.65  . insulin degludec (TRESIBA FLEXTOUCH) 100 UNIT/ML SOPN FlexTouch Pen Inject 0.3 mLs (30 Units total) into the skin daily at 10 pm.  . metFORMIN (GLUCOPHAGE) 500 MG tablet Take 1 tablet (500 mg total) by mouth 2 (two) times daily with a meal.   No facility-administered encounter medications on file as of 12/25/2017.    ALLERGIES: Allergies  Allergen Reactions  . Zolpidem Tartrate Other (See Comments)    disorientation    VACCINATION STATUS: Immunization History  Administered Date(s) Administered  . Influenza Split 06/30/2013  . Influenza-Unspecified 07/30/2015  . Pneumococcal Polysaccharide-23 07/31/2007  . Td 10/30/2002  . Tdap 06/19/2016  .  Zoster 06/19/2016    Diabetes  He presents for his follow-up diabetic visit. He has type 2 diabetes mellitus. Onset time: He was diagnosed at approximate age of 40 years. His disease course has been improving. There are no hypoglycemic associated symptoms. Pertinent negatives for hypoglycemia include no confusion, headaches, pallor or seizures. There are no diabetic associated symptoms. Pertinent negatives for diabetes include no chest pain, no fatigue, no polydipsia, no polyphagia, no polyuria and no weakness. There are no hypoglycemic complications. Symptoms are improving. Diabetic complications include nephropathy and PVD. (Status post bilateral lower extremity amputations. He is wheelchair-bound, awaiting for leg prosthetics.) Risk factors for coronary artery disease include diabetes mellitus, dyslipidemia, hypertension, male sex, obesity, sedentary lifestyle and tobacco exposure. Current diabetic treatment includes insulin injections (He could not afford [place], Toujeo, due to a gap in his insurance, currently on the on metformin 500 minute grams by mouth twice a day.). He is compliant with treatment most of the time. His weight is stable. He is following a diabetic diet. When asked about meal planning, he reported none. He has had a previous visit with a dietitian. He never participates in exercise. His home blood glucose trend is decreasing steadily. (He did not bring any meter or logs to review today.  His A1c has improved from 6.5% from 7.6% last visit.) An ACE inhibitor/angiotensin II receptor blocker is being taken.  Hyperlipidemia  This is a chronic problem. The current episode started more than 1 year ago. The problem is controlled. Pertinent negatives include no chest pain, myalgias or shortness of breath. Current antihyperlipidemic treatment includes statins. Risk factors for coronary artery disease include dyslipidemia, diabetes mellitus, hypertension, male sex, obesity and a sedentary  lifestyle.  Hypertension  This is a chronic problem. The current episode started more than 1 year ago. Pertinent negatives include no chest pain, headaches, neck pain, palpitations or shortness of breath. Risk factors for coronary artery disease include diabetes mellitus, dyslipidemia, male gender, obesity, sedentary lifestyle and smoking/tobacco exposure. Hypertensive end-organ damage includes PVD.    Review of Systems  Constitutional: Negative for fatigue and unexpected weight change.  HENT: Negative for dental problem, mouth sores and trouble swallowing.   Eyes: Negative for visual disturbance.  Respiratory: Negative for cough, choking, chest tightness, shortness of breath and wheezing.   Cardiovascular: Negative for chest pain, palpitations and leg swelling.  Gastrointestinal: Negative for abdominal distention, abdominal pain, constipation, diarrhea, nausea and vomiting.  Endocrine: Negative for polydipsia, polyphagia and polyuria.  Genitourinary: Negative for dysuria, flank pain, hematuria and urgency.  Musculoskeletal: Positive for gait problem. Negative for back pain, myalgias and neck pain.       He now walks with bilateral leg prostheses and crutches.  Skin: Negative for pallor, rash and wound.  Neurological: Negative for  seizures, syncope, weakness, numbness and headaches.  Psychiatric/Behavioral: Negative.  Negative for confusion, dysphoric mood and suicidal ideas.    Objective:    BP 131/83   Pulse 73   Ht 6' (1.829 m)   Wt 245 lb (111.1 kg)   BMI 33.23 kg/m   Wt Readings from Last 3 Encounters:  12/25/17 245 lb (111.1 kg)  09/18/17 245 lb (111.1 kg)  07/10/17 243 lb 6.4 oz (110.4 kg)    Physical Exam  Constitutional: He is oriented to person, place, and time. He appears well-developed and well-nourished. He is cooperative. No distress.  HENT:  Head: Normocephalic and atraumatic.  Eyes: EOM are normal.  Neck: Normal range of motion. Neck supple. No tracheal  deviation present. No thyromegaly present.  Cardiovascular: Normal rate, S1 normal, S2 normal and normal heart sounds. Exam reveals no gallop.  No murmur heard. Pulses:      Dorsalis pedis pulses are 1+ on the right side, and 1+ on the left side.       Posterior tibial pulses are 1+ on the right side, and 1+ on the left side.  Pulmonary/Chest: Breath sounds normal. No respiratory distress. He has no wheezes.  Abdominal: Soft. Bowel sounds are normal. He exhibits no distension. There is no tenderness. There is no guarding and no CVA tenderness.  Musculoskeletal:       Right shoulder: He exhibits no swelling and no deformity.  He now walks with bilateral leg prostheses and crutches.  Neurological: He is alert and oriented to person, place, and time. He has normal strength and normal reflexes. No cranial nerve deficit or sensory deficit. Gait normal.  Skin: Skin is warm and dry. No rash noted. No cyanosis. Nails show no clubbing.  Psychiatric: He has a normal mood and affect. His speech is normal and behavior is normal. Judgment and thought content normal. Cognition and memory are normal.    Results for orders placed or performed in visit on 09/18/17  COMPLETE METABOLIC PANEL WITH GFR  Result Value Ref Range   Glucose, Bld 116 65 - 139 mg/dL   BUN 32 (H) 7 - 25 mg/dL   Creat 1.54 (H) 0.70 - 1.25 mg/dL   GFR, Est Non African American 47 (L) > OR = 60 mL/min/1.11m2   GFR, Est African American 54 (L) > OR = 60 mL/min/1.36m2   BUN/Creatinine Ratio 21 6 - 22 (calc)   Sodium 142 135 - 146 mmol/L   Potassium 4.1 3.5 - 5.3 mmol/L   Chloride 105 98 - 110 mmol/L   CO2 24 20 - 32 mmol/L   Calcium 9.1 8.6 - 10.3 mg/dL   Total Protein 6.2 6.1 - 8.1 g/dL   Albumin 3.5 (L) 3.6 - 5.1 g/dL   Globulin 2.7 1.9 - 3.7 g/dL (calc)   AG Ratio 1.3 1.0 - 2.5 (calc)   Total Bilirubin 0.4 0.2 - 1.2 mg/dL   Alkaline phosphatase (APISO) 76 40 - 115 U/L   AST 18 10 - 35 U/L   ALT 20 9 - 46 U/L  Hemoglobin A1c   Result Value Ref Range   Hgb A1c MFr Bld 6.5 (H) <5.7 % of total Hgb   Mean Plasma Glucose 140 (calc)   eAG (mmol/L) 7.7 (calc)   Complete Blood Count (Most recent): Lab Results  Component Value Date   WBC 7.6 05/20/2015   HGB 8.3 (L) 05/20/2015   HCT 25.4 (L) 05/20/2015   MCV 87.3 05/20/2015   PLT 380 05/20/2015  Chemistry (most recent): Lab Results  Component Value Date   NA 142 12/18/2017   K 4.1 12/18/2017   CL 105 12/18/2017   CO2 24 12/18/2017   BUN 32 (H) 12/18/2017   CREATININE 1.54 (H) 12/18/2017   Diabetic Labs (most recent): Lab Results  Component Value Date   HGBA1C 6.5 (H) 12/18/2017   HGBA1C 7.6 (H) 09/12/2017   HGBA1C 6.9 (H) 05/15/2017   Lipid profile (most recent): Lab Results  Component Value Date   TRIG 71 10/31/2016   CHOL 106 10/31/2016    Assessment & Plan:   1. Type 2 diabetes mellitus with other circulatory complications (HCC)  -His  diabetes is  complicated by extensive peripheral arterial disease with bilateral lower extremity amputations and patient remains at a high risk for more acute and chronic complications of diabetes which include CAD, CVA, CKD, retinopathy, and neuropathy. These are all discussed in detail with the patient.  Patient came with improved A1c of 6.5%, improving from 7.6% during last visit.     Recent labs reviewed, showing stage 2 CKD.  - I have re-counseled the patient on diet management and weight loss  by adopting a carbohydrate restricted / protein rich  Diet.  -  Suggestion is made for him to avoid simple carbohydrates  from his diet including Cakes, Sweet Desserts / Pastries, Ice Cream, Soda (diet and regular), Sweet Tea, Candies, Chips, Cookies, Store Bought Juices, Alcohol in Excess of  1-2 drinks a day, Artificial Sweeteners, and "Sugar-free" Products. This will help patient to have stable blood glucose profile and potentially avoid unintended weight gain.   - Patient is advised to stick to a routine  mealtimes to eat 3 meals  a day and avoid unnecessary snacks ( to snack only to correct hypoglycemia).   - I have approached patient with the following individualized plan to manage diabetes and patient agrees. - Based on his insurance review, [facility] is preferred over  [place].  He still has some Toujeo leftover.  He will continue Toujeo/Tresiba 30 units daily at bedtime associated with with monitoring of blood glucose every day before breakfast.  - Trulicity  is not covered by his insurance, samples of Trulicity 1.96 mg subcutaneously weekly were given from the clinic.    - He has improving stage  2-3 renal insufficiency, not a candidate for full dose metformin. However he will continue to benefit from metformin therapy with appropriate hydration.  - I will continue metformin 500 mg by mouth twice a day.   - Patient specific target  for A1c; LDL, HDL, Triglycerides, and  Waist Circumference were discussed in detail.  2) BP/HTN: His blood pressure is controlled to target.   Continue current medications including ACEI/ARB. 3) Lipids/HPL:  continue statins. 4)  Weight/Diet: CDE consult in progress, exercise, and carbohydrates information provided.  5) Chronic Care/Health Maintenance:  -Patient is on ACEI/ARB and Statin medications and encouraged to continue to follow up with Ophthalmology, urology given his recent diagnosis of prostate cancer, Podiatrist at least yearly or according to recommendations, and advised to  stay away from smoking. I have recommended yearly flu vaccine and pneumonia vaccination at least every 5 years; moderate intensity exercise for up to 150 minutes weekly; and  sleep for at least 7 hours a day.  - I advised patient to maintain close follow up with Alycia Rossetti, MD for primary care needs.  - Time spent with the patient: 25 min, of which >50% was spent in reviewing his blood  glucose logs , discussing his hypo- and hyper-glycemic episodes, reviewing his current and   previous labs and insulin doses and developing a plan to avoid hypo- and hyper-glycemia. Please refer to Patient Instructions for Blood Glucose Monitoring and Insulin/Medications Dosing Guide"  in media tab for additional information.   Follow up plan: -Return in about 4 months (around 04/24/2018) for meter, and logs, follow up with pre-visit labs, meter, and logs.  Glade Lloyd, MD Phone: [phone]  Fax: [phone]  -  This note was partially dictated with voice recognition software. Similar sounding words can be transcribed inadequately or may not  be corrected upon review.  12/25/2017, 1:36 PM

## 2018-01-08 ENCOUNTER — Ambulatory Visit (INDEPENDENT_AMBULATORY_CARE_PROVIDER_SITE_OTHER): Payer: Medicare Other | Admitting: Family Medicine

## 2018-01-08 ENCOUNTER — Other Ambulatory Visit: Payer: Self-pay

## 2018-01-08 ENCOUNTER — Encounter: Payer: Self-pay | Admitting: Family Medicine

## 2018-01-08 VITALS — BP 124/68 | HR 66 | Temp 97.5°F | Resp 14 | Ht 72.0 in | Wt 245.0 lb

## 2018-01-08 DIAGNOSIS — Z89511 Acquired absence of right leg below knee: Secondary | ICD-10-CM | POA: Diagnosis not present

## 2018-01-08 DIAGNOSIS — E1159 Type 2 diabetes mellitus with other circulatory complications: Secondary | ICD-10-CM | POA: Diagnosis not present

## 2018-01-08 DIAGNOSIS — I10 Essential (primary) hypertension: Secondary | ICD-10-CM

## 2018-01-08 DIAGNOSIS — Z0000 Encounter for general adult medical examination without abnormal findings: Secondary | ICD-10-CM | POA: Diagnosis not present

## 2018-01-08 DIAGNOSIS — N3281 Overactive bladder: Secondary | ICD-10-CM

## 2018-01-08 DIAGNOSIS — Z89612 Acquired absence of left leg above knee: Secondary | ICD-10-CM

## 2018-01-08 DIAGNOSIS — C61 Malignant neoplasm of prostate: Secondary | ICD-10-CM

## 2018-01-08 DIAGNOSIS — N184 Chronic kidney disease, stage 4 (severe): Secondary | ICD-10-CM

## 2018-01-08 DIAGNOSIS — E782 Mixed hyperlipidemia: Secondary | ICD-10-CM

## 2018-01-08 DIAGNOSIS — Z23 Encounter for immunization: Secondary | ICD-10-CM

## 2018-01-08 MED ORDER — MIRABEGRON ER 25 MG PO TB24: 25.0000 mg | ORAL_TABLET | Freq: Every day | ORAL | 0 refills | Status: DC

## 2018-01-08 NOTE — Progress Notes (Signed)
Subjective:   Patient presents for Medicare Annual/Subsequent preventive examination.  Patient here for annual wellness exam. DMV form that needs to be completed.  He has bilateral prosthesis.  His vehicle has been modified with hand controls.  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  This was during January 2019  Chronic kidney disease he is still followed by nephrology is typically seen once a year   Followed by Dr. [name]  urology for his prostate cancer.  He is status post radiation.  He has had overactive bladder feels like he cannot hold his urine will start leaking before he can get to the restroom.  Requested something to try to help with this.  With urology is not until next month  Hyperlipidemia he is due for cholesterol check  He is due for eye appointment the setting of his diabetes mellitus which is treated by his endocrinologist  Bilateral amputee he is requesting a new shower wheelchair to help him with mobility to get into the shower today.  His current wheelchair is falling apart.   Review Past Medical/Family/Social: per EMR   Risk Factors  Current exercise habits: None Dietary issues discussed: YES  Cardiac risk factors: Obesity (BMI >= 30 kg/m2). DM, HTN  Depression Screen  (Note: if answer to either of the following is "Yes", a more complete depression screening is indicated)  Over the past two weeks, have you felt down, depressed or hopeless? No Over the past two weeks, have you felt little interest or pleasure in doing things? No Have you lost interest or pleasure in daily life? No Do you often feel hopeless? No Do you cry easily over simple problems? No   Activities of Daily Living  In your present state of health, do you have any difficulty performing the following activities?:  Driving? No  Managing money? No  Feeding yourself? No  Getting  from bed to chair? No  Climbing a flight of stairs? No  Preparing food and eating?: No  Bathing or showering? No  Getting dressed: No  Getting to the toilet? No  Using the toilet:No  Moving around from place to place: No  In the past year have you fallen or had a near fall? yes Are you sexually active? No  Do you have more than one partner? No   Hearing Difficulties: No  Do you often ask people to speak up or repeat themselves? No  Do you experience ringing or noises in your ears? No Do you have difficulty understanding soft or whispered voices? No  Do you feel that you have a problem with memory? No Do you often misplace items? No  Do you feel safe at home? Yes  Cognitive Testing  Alert? Yes Normal Appearance?Yes  Oriented to person? Yes Place? Yes  Time? Yes  Recall of three objects? Yes  Can perform simple calculations? Yes  Displays appropriate judgment?Yes  Can read the correct time from a watch face?Yes   List the Names of Other Physician/Practitioners you currently use:    Indicate any recent Medical Services you may have received from other than [facility] providers in the past year (date may be approximate).   Screening Tests / Date UTD Colonoscopy                     Zostavax  Mammogram  Pneumonia- due for prevnar 13 Tetanus/tdap  PHYSICAL: GEN-  denies fatigue, fever, weight loss,weakness, recent illness HEENT- denies eye drainage, change in vision, nasal discharge, CVS- denies chest pain, palpitations RESP- denies SOB, cough, wheeze ABD- denies N/V, change in stools, abd pain GU- denies dysuria, hematuria, dribbling, incontinence MSK- denies joint pain, muscle aches, injury Neuro- denies headache, dizziness, syncope, seizure activity  PHYSICAL GEN- NAD, alert and oriented x3 HEENT- PERRL, EOMI, non injected sclera, pink conjunctiva, MMM, oropharynx clear Neck- Supple, no bruit CVS- RRR, no murmur RESP-CTAB EXT- bilat amputee with prosthesis bilat MSK-  FROM upper ext, strength 5/5 bilat sensation in tact UE, Lower bilat thigh decreased tone, strength HF 4+/5 bilat, bilat prosthesis Pulses- Radial 2+   Assessment:    Annual wellness medicare exam   Plan:    During the course of the visit the patient was educated and counseled about appropriate screening and preventive services including:  Prevnar 13 given  Screen NEG  for depression.  DM- per endocrinology, fairly well controlled  - foot exam not indicated - eye exam to be scheduled    CKD- followed by nephrology   Prostate cancer- per urology, some OAB symptoms since treatment, given myrbetriq  25mg  samples to try   HTN- controlled  bilat prosthesis/ AKA and BKA- to ensure quality of life and assist with ADL'S bathing, needs shower wheelchair so caregivers can also assist him.   DMV Forms to be completed, he has passed evaluation and vehicle is handicap acccesible. He will have restrictions of night driving which is unchanged.      Diet review for nutrition referral? Yes ____ Not Indicated __x__  Patient Instructions (the written plan) was given to the patient.  Medicare Attestation  I have personally reviewed:  The patient's medical and social history  Their use of alcohol, tobacco or illicit drugs  Their current medications and supplements  The patient's functional ability including ADLs,fall risks, home safety risks, cognitive, and hearing and visual impairment  Diet and physical activities  Evidence for depression or mood disorders  The patient's weight, height, BMI, and visual acuity have been recorded in the chart. I have made referrals, counseling, and provided education to the patient based on review of the above and I have provided the patient with a written personalized care plan for preventive services.

## 2018-01-08 NOTE — Patient Instructions (Addendum)
F/U 4 months  Prevnar 13 Given  Shower wheelchair to be ordered  Try myrbetiq for your bladder once a day

## 2018-01-09 ENCOUNTER — Other Ambulatory Visit: Payer: Self-pay | Admitting: Family Medicine

## 2018-01-09 ENCOUNTER — Encounter: Payer: Self-pay | Admitting: Family Medicine

## 2018-01-09 LAB — CBC WITH DIFFERENTIAL/PLATELET
BASOS PCT: 0.7 %
Basophils Absolute: 32 cells/uL (ref 0–200)
EOS ABS: 99 {cells}/uL (ref 15–500)
EOS PCT: 2.2 %
HCT: 34.2 % — ABNORMAL LOW (ref 38.5–50.0)
HEMOGLOBIN: 11.3 g/dL — AB (ref 13.2–17.1)
Lymphs Abs: 684 cells/uL — ABNORMAL LOW (ref 850–3900)
MCH: 29.7 pg (ref 27.0–33.0)
MCHC: 33 g/dL (ref 32.0–36.0)
MCV: 89.8 fL (ref 80.0–100.0)
MONOS PCT: 8.3 %
MPV: 9.1 fL (ref 7.5–12.5)
NEUTROS ABS: 3312 {cells}/uL (ref 1500–7800)
Neutrophils Relative %: 73.6 %
Platelets: 230 10*3/uL (ref 140–400)
RBC: 3.81 10*6/uL — ABNORMAL LOW (ref 4.20–5.80)
RDW: 13.8 % (ref 11.0–15.0)
Total Lymphocyte: 15.2 %
WBC mixed population: 374 cells/uL (ref 200–950)
WBC: 4.5 10*3/uL (ref 3.8–10.8)

## 2018-01-09 LAB — LIPID PANEL
CHOL/HDL RATIO: 2.9 (calc) (ref <5.0)
CHOLESTEROL: 106 mg/dL (ref <200)
HDL: 36 mg/dL — ABNORMAL LOW (ref >40)
LDL Cholesterol (Calc): 52 mg/dL (calc)
Non-HDL Cholesterol (Calc): 70 mg/dL (calc) (ref <130)
Triglycerides: 93 mg/dL (ref <150)

## 2018-02-12 DIAGNOSIS — C61 Malignant neoplasm of prostate: Secondary | ICD-10-CM | POA: Diagnosis not present

## 2018-02-18 ENCOUNTER — Other Ambulatory Visit: Payer: Self-pay | Admitting: *Deleted

## 2018-02-18 MED ORDER — DULAGLUTIDE 0.75 MG/0.5ML ~~LOC~~ SOAJ: SUBCUTANEOUS | 11 refills | Status: DC

## 2018-02-22 ENCOUNTER — Ambulatory Visit (INDEPENDENT_AMBULATORY_CARE_PROVIDER_SITE_OTHER): Payer: Medicare Other | Admitting: Urology

## 2018-02-22 DIAGNOSIS — C61 Malignant neoplasm of prostate: Secondary | ICD-10-CM | POA: Diagnosis not present

## 2018-02-22 DIAGNOSIS — N3941 Urge incontinence: Secondary | ICD-10-CM | POA: Diagnosis not present

## 2018-02-26 DIAGNOSIS — C61 Malignant neoplasm of prostate: Secondary | ICD-10-CM | POA: Diagnosis not present

## 2018-03-11 ENCOUNTER — Telehealth: Payer: Self-pay

## 2018-03-11 NOTE — Telephone Encounter (Signed)
Yes, he can take 1.5mg  weekly.

## 2018-03-11 NOTE — Telephone Encounter (Signed)
Pt.notified

## 2018-03-11 NOTE — Telephone Encounter (Signed)
Pt is out of Trulicity 0.75mg . He cannot afford a refill at this time. However he does have some samples of trulicity 1.5mg  that he got from his PCP. He is asking if he can take the 1.5mg 

## 2018-03-12 ENCOUNTER — Other Ambulatory Visit: Payer: Self-pay | Admitting: "Endocrinology

## 2018-04-18 DIAGNOSIS — E1159 Type 2 diabetes mellitus with other circulatory complications: Secondary | ICD-10-CM | POA: Diagnosis not present

## 2018-04-19 LAB — HEMOGLOBIN A1C
HEMOGLOBIN A1C: 6.6 %{Hb} — AB (ref <5.7)
MEAN PLASMA GLUCOSE: 143 (calc)
eAG (mmol/L): 7.9 (calc)

## 2018-04-19 LAB — COMPLETE METABOLIC PANEL WITH GFR
AG Ratio: 1.2 (calc) (ref 1.0–2.5)
ALKALINE PHOSPHATASE (APISO): 94 U/L (ref 40–115)
ALT: 15 U/L (ref 9–46)
AST: 14 U/L (ref 10–35)
Albumin: 3.2 g/dL — ABNORMAL LOW (ref 3.6–5.1)
BILIRUBIN TOTAL: 0.4 mg/dL (ref 0.2–1.2)
BUN/Creatinine Ratio: 18 (calc) (ref 6–22)
BUN: 26 mg/dL — AB (ref 7–25)
CHLORIDE: 107 mmol/L (ref 98–110)
CO2: 26 mmol/L (ref 20–32)
Calcium: 8.6 mg/dL (ref 8.6–10.3)
Creat: 1.46 mg/dL — ABNORMAL HIGH (ref 0.70–1.25)
GFR, EST AFRICAN AMERICAN: 58 mL/min/{1.73_m2} — AB (ref >=60)
GFR, Est Non African American: 50 mL/min/{1.73_m2} — ABNORMAL LOW (ref >=60)
GLUCOSE: 188 mg/dL — AB (ref 65–139)
Globulin: 2.6 g/dL (calc) (ref 1.9–3.7)
Potassium: 4 mmol/L (ref 3.5–5.3)
Sodium: 142 mmol/L (ref 135–146)
TOTAL PROTEIN: 5.8 g/dL — AB (ref 6.1–8.1)

## 2018-04-24 ENCOUNTER — Encounter: Payer: Self-pay | Admitting: "Endocrinology

## 2018-04-24 ENCOUNTER — Ambulatory Visit (INDEPENDENT_AMBULATORY_CARE_PROVIDER_SITE_OTHER): Payer: Medicare Other | Admitting: "Endocrinology

## 2018-04-24 VITALS — BP 116/76 | HR 82 | Ht 72.0 in | Wt 245.0 lb

## 2018-04-24 DIAGNOSIS — E782 Mixed hyperlipidemia: Secondary | ICD-10-CM | POA: Diagnosis not present

## 2018-04-24 DIAGNOSIS — E1159 Type 2 diabetes mellitus with other circulatory complications: Secondary | ICD-10-CM | POA: Diagnosis not present

## 2018-04-24 DIAGNOSIS — I10 Essential (primary) hypertension: Secondary | ICD-10-CM

## 2018-04-24 MED ORDER — ATORVASTATIN CALCIUM 40 MG PO TABS: 40.0000 mg | ORAL_TABLET | Freq: Every day | ORAL | 6 refills | Status: DC

## 2018-04-24 NOTE — Patient Instructions (Signed)

## 2018-04-24 NOTE — Progress Notes (Signed)
Subjective:    Patient ID: [name], male    DOB: 1951/03/13, PCP Tom Rossetti, MD   Past Medical History:  Diagnosis Date  . Arthritis   . Cerebrovascular disease    MRI shows Right carotid inferior cavernours narrowing 75% and  50-75% stenosis of Cavernous and supraclinoi right side  . CKD (chronic kidney disease) 06/28/2014   Sees Dr Florene Glen  . Hyperlipidemia   . Hypertension   . Myocardial infarction ([place])    "mild" heart attack  . Necrosis ([place])    #2 nail   . Pneumonia    as a child  . Poor circulation of extremity   . Prostate cancer ([place])   . Sleep apnea    uses cpap, getting a new one  . Type 2 Diabetes mellitus    Type 2   Past Surgical History:  Procedure Laterality Date  . ABOVE KNEE LEG AMPUTATION Left 2004  . AMPUTATION Right 04/28/2015   Procedure: AMPUTATION RAY, RIGHT 5TH TOE;  Surgeon: Marybelle Killings, MD;  Location: [facility];  Service: Orthopedics;  Laterality: Right;  . AMPUTATION Right 05/10/2015   Procedure: Right Below Knee Amputation;  Surgeon: Marybelle Killings, MD;  Location: [facility];  Service: Orthopedics;  Laterality: Right;  . CATARACT EXTRACTION W/PHACO  09/05/2012   Procedure: CATARACT EXTRACTION PHACO AND INTRAOCULAR LENS PLACEMENT (IOC);  Surgeon: Tonny Branch, MD;  Location: [facility];  Service: Ophthalmology;  Laterality: Left;  CDE=5.45  . CATARACT EXTRACTION W/PHACO  10/03/2012   Procedure: CATARACT EXTRACTION PHACO AND INTRAOCULAR LENS PLACEMENT (IOC);  Surgeon: Tonny Branch, MD;  Location: [facility];  Service: Ophthalmology;  Laterality: Right;  CDE: 12.31  . COLONOSCOPY     Social History   Socioeconomic History  . Marital status: Married    Spouse name: Not on file  . Number of children: 2  . Years of education: college  . Highest education level: Not on file  Occupational History  . Occupation: Mining engineer: [employer]  Social Needs  . Financial resource strain: Not on file  . Food insecurity:    Worry: Not on file    Inability: Not on file  . Transportation needs:    Medical: Not on file    Non-medical: Not on file  Tobacco Use  . Smoking status: Never Smoker  . Smokeless tobacco: Never Used  Substance and Sexual Activity  . Alcohol use: No  . Drug use: No  . Sexual activity: Never  Lifestyle  . Physical activity:    Days per week: Not on file    Minutes per session: Not on file  . Stress: Not on file  Relationships  . Social connections:    Talks on phone: Not on file    Gets together: Not on file    Attends religious service: Not on file    Active member of club or organization: Not on file    Attends meetings of clubs or organizations: Not on file    Relationship status: Not on file  Other Topics Concern  . Not on file  Social History Narrative   Patient lives with his wife    Patient right handed   Patient drinks caffine on occ.   Outpatient Encounter Medications as of 04/24/2018  Medication Sig  . insulin degludec (TRESIBA FLEXTOUCH) 100 UNIT/ML SOPN FlexTouch Pen Inject into the skin at bedtime.  . Semaglutide 0.25 or 0.5 MG/DOSE SOPN Inject 0.5 mg  into the skin once a week.  Marland Kitchen acetaminophen (TYLENOL) 325 MG tablet Take 1-2 tablets (325-650 mg total) by mouth every 4 (four) hours as needed for mild pain.  Marland Kitchen atenolol (TENORMIN) 25 MG tablet TAKE 1 TABLET BY MOUTH EVERY DAY  . atorvastatin (LIPITOR) 40 MG tablet Take 1 tablet (40 mg total) by mouth daily.  . B-D UF III MINI PEN NEEDLES 31G X 5 MM MISC USE FOUR TIMES DAILY AS DIRECTED  . Calcium Carb-Cholecalciferol (CALCIUM PLUS VITAMIN D3) 600-500 MG-UNIT CAPS Take 1 capsule by mouth daily.   . clopidogrel (PLAVIX) 75 MG tablet TAKE 1 TABLET BY MOUTH EVERY DAY  . furosemide (LASIX) 40 MG tablet TAKE 1 TABLET BY MOUTH ONCE DAILY  . glucose blood (BAYER CONTOUR TEST) test strip Use as instructed bid. E11.65  . metFORMIN (GLUCOPHAGE) 500 MG tablet TAKE 1 TABLET BY MOUTH TWO TIMES DAILY WITH FOOD  . mirabegron ER (MYRBETRIQ) 25 MG  TB24 tablet Take 1 tablet (25 mg total) by mouth daily.  . [DISCONTINUED] atorvastatin (LIPITOR) 80 MG tablet Take 1 tablet (80 mg total) by mouth daily.  . [DISCONTINUED] Dulaglutide (TRULICITY) 6.54 YT/0.3TW SOPN Inject SQ Q Week  . [DISCONTINUED] TOUJEO SOLOSTAR 300 UNIT/ML SOPN INJECT 20 UNITS INTO SKIN AT BEDTIME   No facility-administered encounter medications on file as of 04/24/2018.    ALLERGIES: Allergies  Allergen Reactions  . Zolpidem Tartrate Other (See Comments)    disorientation    VACCINATION STATUS: Immunization History  Administered Date(s) Administered  . Influenza Split 06/30/2013, 08/30/2013  . Influenza-Unspecified 07/30/2015  . Pneumococcal Conjugate-13 01/08/2018  . Pneumococcal Polysaccharide-23 07/31/2007  . Td 10/30/2002  . Tdap 06/19/2016  . Zoster 06/19/2016    Diabetes  He presents for his follow-up diabetic visit. He has type 2 diabetes mellitus. Onset time: He was diagnosed at approximate age of 68 years. His disease course has been improving. There are no hypoglycemic associated symptoms. Pertinent negatives for hypoglycemia include no confusion, headaches, pallor or seizures. There are no diabetic associated symptoms. Pertinent negatives for diabetes include no chest pain, no fatigue, no polydipsia, no polyphagia, no polyuria and no weakness. There are no hypoglycemic complications. Symptoms are improving. Diabetic complications include nephropathy and PVD. (Status post bilateral lower extremity amputations. He is wheelchair-bound, awaiting for leg prosthetics.) Risk factors for coronary artery disease include diabetes mellitus, dyslipidemia, hypertension, male sex, obesity, sedentary lifestyle and tobacco exposure. Current diabetic treatment includes insulin injections (He could not afford [place], Toujeo, due to a gap in his insurance, currently on the on metformin 500 minute grams by mouth twice a day.). He is compliant with treatment most of the time.  His weight is stable. He is following a diabetic diet. When asked about meal planning, he reported none. He has had a previous visit with a dietitian. He never participates in exercise. His home blood glucose trend is decreasing steadily. His breakfast blood glucose range is generally 110-130 mg/dl. His bedtime blood glucose range is generally 130-140 mg/dl. His overall blood glucose range is 130-140 mg/dl. An ACE inhibitor/angiotensin II receptor blocker is being taken.  Hyperlipidemia  This is a chronic problem. The current episode started more than 1 year ago. The problem is controlled. Pertinent negatives include no chest pain, myalgias or shortness of breath. Current antihyperlipidemic treatment includes statins. Risk factors for coronary artery disease include dyslipidemia, diabetes mellitus, hypertension, male sex, obesity and a sedentary lifestyle.  Hypertension  This is a chronic problem. The current episode started more  than 1 year ago. Pertinent negatives include no chest pain, headaches, neck pain, palpitations or shortness of breath. Risk factors for coronary artery disease include diabetes mellitus, dyslipidemia, male gender, obesity, sedentary lifestyle and smoking/tobacco exposure. Hypertensive end-organ damage includes PVD.    Review of Systems  Constitutional: Negative for fatigue and unexpected weight change.  HENT: Negative for dental problem, mouth sores and trouble swallowing.   Eyes: Negative for visual disturbance.  Respiratory: Negative for cough, choking, chest tightness, shortness of breath and wheezing.   Cardiovascular: Negative for chest pain, palpitations and leg swelling.  Gastrointestinal: Negative for abdominal distention, abdominal pain, constipation, diarrhea, nausea and vomiting.  Endocrine: Negative for polydipsia, polyphagia and polyuria.  Genitourinary: Negative for dysuria, flank pain, hematuria and urgency.  Musculoskeletal: Positive for gait problem.  Negative for back pain, myalgias and neck pain.       He now walks with bilateral leg prostheses and crutches.  Skin: Negative for pallor, rash and wound.  Neurological: Negative for seizures, syncope, weakness, numbness and headaches.  Psychiatric/Behavioral: Negative.  Negative for confusion, dysphoric mood and suicidal ideas.    Objective:    BP 116/76   Pulse 82   Ht 6' (1.829 m)   Wt 245 lb (111.1 kg)   BMI 33.23 kg/m   Wt Readings from Last 3 Encounters:  04/24/18 245 lb (111.1 kg)  01/08/18 245 lb (111.1 kg)  12/25/17 245 lb (111.1 kg)    Physical Exam  Constitutional: He is oriented to person, place, and time. He appears well-developed. He is cooperative. No distress.  HENT:  Head: Normocephalic and atraumatic.  Eyes: EOM are normal.  Neck: Normal range of motion. Neck supple. No tracheal deviation present. No thyromegaly present.  Cardiovascular: Normal rate, S1 normal and S2 normal. Exam reveals no gallop.  No murmur heard. Pulses:      Dorsalis pedis pulses are 1+ on the right side, and 1+ on the left side.       Posterior tibial pulses are 1+ on the right side, and 1+ on the left side.  Pulmonary/Chest: Effort normal. No respiratory distress. He has no wheezes.  Abdominal: He exhibits no distension. There is no tenderness. There is no guarding and no CVA tenderness.  Musculoskeletal:       Right shoulder: He exhibits no swelling and no deformity.  He now walks with bilateral leg prostheses and crutches.  Neurological: He is alert and oriented to person, place, and time. He has normal strength. No cranial nerve deficit or sensory deficit. Gait normal.  Skin: Skin is warm and dry. No rash noted. No cyanosis. Nails show no clubbing.  Psychiatric: He has a normal mood and affect. His speech is normal. Judgment normal. Cognition and memory are normal.    Results for orders placed or performed in visit on 01/08/18  Lipid panel  Result Value Ref Range   Cholesterol  106 <200 mg/dL   HDL 36 (L) >40 mg/dL   Triglycerides 93 <150 mg/dL   LDL Cholesterol (Calc) 52 mg/dL (calc)   Total CHOL/HDL Ratio 2.9 <5.0 (calc)   Non-HDL Cholesterol (Calc) 70 <130 mg/dL (calc)  CBC with Differential/Platelet  Result Value Ref Range   WBC 4.5 3.8 - 10.8 Thousand/uL   RBC 3.81 (L) 4.20 - 5.80 Million/uL   Hemoglobin 11.3 (L) 13.2 - 17.1 g/dL   HCT 34.2 (L) 38.5 - 50.0 %   MCV 89.8 80.0 - 100.0 fL   MCH 29.7 27.0 - 33.0 pg  MCHC 33.0 32.0 - 36.0 g/dL   RDW 13.8 11.0 - 15.0 %   Platelets 230 140 - 400 Thousand/uL   MPV 9.1 7.5 - 12.5 fL   Neutro Abs 3,312 1,500 - 7,800 cells/uL   Lymphs Abs 684 (L) 850 - 3,900 cells/uL   WBC mixed population 374 200 - 950 cells/uL   Eosinophils Absolute 99 15 - 500 cells/uL   Basophils Absolute 32 0 - 200 cells/uL   Neutrophils Relative % 73.6 %   Total Lymphocyte 15.2 %   Monocytes Relative 8.3 %   Eosinophils Relative 2.2 %   Basophils Relative 0.7 %   Complete Blood Count (Most recent): Lab Results  Component Value Date   WBC 4.5 01/08/2018   HGB 11.3 (L) 01/08/2018   HCT 34.2 (L) 01/08/2018   MCV 89.8 01/08/2018   PLT 230 01/08/2018   Chemistry (most recent): Lab Results  Component Value Date   NA 142 04/18/2018   K 4.0 04/18/2018   CL 107 04/18/2018   CO2 26 04/18/2018   BUN 26 (H) 04/18/2018   CREATININE 1.46 (H) 04/18/2018   Diabetic Labs (most recent): Lab Results  Component Value Date   HGBA1C 6.6 (H) 04/18/2018   HGBA1C 6.5 (H) 12/18/2017   HGBA1C 7.6 (H) 09/12/2017   Lipid Panel     Component Value Date/Time   CHOL 106 01/08/2018 1024   CHOL 158 02/23/2015 0931   TRIG 93 01/08/2018 1024   TRIG 98 10/27/2013 1305   HDL 36 (L) 01/08/2018 1024   HDL 55 02/23/2015 0931   HDL 39 (L) 10/27/2013 1305   CHOLHDL 2.9 01/08/2018 1024   VLDL 14 10/31/2016 1137   LDLCALC 52 01/08/2018 1024   LDLCALC 82 10/27/2013 1305    Assessment & Plan:   1. Type 2 diabetes mellitus with other circulatory  complications (HCC)  -His  diabetes is  complicated by extensive peripheral arterial disease with bilateral lower extremity amputations and patient remains at a high risk for more acute and chronic complications of diabetes which include CAD, CVA, CKD, retinopathy, and neuropathy. These are all discussed in detail with the patient.  Patient came with stable glycemic profile and A1c of 6.6%, overall improving from 7.6%.     Recent labs reviewed, showing stage stable  3 renal insufficiency.   - I have re-counseled the patient on diet management and weight loss  by adopting a carbohydrate restricted / protein rich  Diet.  -  Suggestion is made for him to avoid simple carbohydrates  from his diet including Cakes, Sweet Desserts / Pastries, Ice Cream, Soda (diet and regular), Sweet Tea, Candies, Chips, Cookies, Store Bought Juices, Alcohol in Excess of  1-2 drinks a day, Artificial Sweeteners, and "Sugar-free" Products. This will help patient to have stable blood glucose profile and potentially avoid unintended weight gain.   - Patient is advised to stick to a routine mealtimes to eat 3 meals  a day and avoid unnecessary snacks ( to snack only to correct hypoglycemia).   - I have approached patient with the following individualized plan to manage diabetes and patient agrees.  - His insurance provider coverage for [place].  He is advised to continue Tresiba 30 units nightly, continue to hold prandial insulin for now.    -He will continue to monitor blood glucose daily before breakfast and at bedtime.    -His insurance did not provide coverage for Trulicity.  I gave him samples of Ozempic to use 0.5 mg subcutaneously  weekly.    - He has improving stage  2-3 renal insufficiency, not a candidate for full dose metformin.  -He is tolerating low-dose metformin.  I discussed and continued metformin 500 mg p.o. twice daily-after breakfast and after supper.  Precautions with hydration advised.   - Patient  specific target  for A1c; LDL, HDL, Triglycerides, and  Waist Circumference were discussed in detail.  2) BP/HTN: His blood pressure is controlled to target.  He is advised to continue his current blood pressure medications including atenolol 25 mg p.o. daily and as needed Lasix.   3) Lipids/HPL: His recent lipid panel showed controlled LDL at 52.  He will be considered for lower dose of statin.  I lowered his atorvastatin to 40 mg nightly from 80 mg.   4)  Weight/Diet: CDE consult in progress, exercise, and carbohydrates information provided.  5) Chronic Care/Health Maintenance:  -Patient is on Statin medications and encouraged to continue to follow up with Ophthalmology, urology given his recent diagnosis of prostate cancer, Podiatrist at least yearly or according to recommendations, and advised to  stay away from smoking. I have recommended yearly flu vaccine and pneumonia vaccination at least every 5 years; moderate intensity exercise for up to 150 minutes weekly; and  sleep for at least 7 hours a day.  - I advised patient to maintain close follow up with Tom Rossetti, MD for primary care needs.  - Time spent with the patient: 25 min, of which >50% was spent in reviewing his blood glucose logs , discussing his hypo- and hyper-glycemic episodes, reviewing his current and  previous labs and insulin doses and developing a plan to avoid hypo- and hyper-glycemia. Please refer to Patient Instructions for Blood Glucose Monitoring and Insulin/Medications Dosing Guide"  in media tab for additional information. Ozella Almond Manfredo participated in the discussions, expressed understanding, and voiced agreement with the above plans.  All questions were answered to his satisfaction. he is encouraged to contact clinic should he have any questions or concerns prior to his return visit.  Follow up plan: -Return in about 4 months (around 08/24/2018) for follow up with pre-visit labs, meter, and logs.  Glade Lloyd,  MD Phone: [phone]  Fax: [phone]  -  This note was partially dictated with voice recognition software. Similar sounding words can be transcribed inadequately or may not  be corrected upon review.  04/24/2018, 1:45 PM

## 2018-05-13 ENCOUNTER — Ambulatory Visit (INDEPENDENT_AMBULATORY_CARE_PROVIDER_SITE_OTHER): Payer: Medicare Other | Admitting: Family Medicine

## 2018-05-13 ENCOUNTER — Other Ambulatory Visit: Payer: Self-pay

## 2018-05-13 ENCOUNTER — Encounter: Payer: Self-pay | Admitting: Family Medicine

## 2018-05-13 VITALS — BP 120/68 | HR 70 | Temp 98.2°F | Resp 16 | Ht 72.0 in | Wt 245.0 lb

## 2018-05-13 DIAGNOSIS — E782 Mixed hyperlipidemia: Secondary | ICD-10-CM | POA: Diagnosis not present

## 2018-05-13 DIAGNOSIS — Z1211 Encounter for screening for malignant neoplasm of colon: Secondary | ICD-10-CM

## 2018-05-13 DIAGNOSIS — E1159 Type 2 diabetes mellitus with other circulatory complications: Secondary | ICD-10-CM | POA: Diagnosis not present

## 2018-05-13 DIAGNOSIS — N184 Chronic kidney disease, stage 4 (severe): Secondary | ICD-10-CM

## 2018-05-13 DIAGNOSIS — I10 Essential (primary) hypertension: Secondary | ICD-10-CM

## 2018-05-13 DIAGNOSIS — G4733 Obstructive sleep apnea (adult) (pediatric): Secondary | ICD-10-CM | POA: Diagnosis not present

## 2018-05-13 NOTE — Progress Notes (Signed)
   Subjective:    Patient ID: [name], male    DOB: 1951-10-20, 67 y.o.   MRN: [id number]  Patient presents for Follow-up (is not fasting)    Pt here to f/u chronic medical problems   Followed by endocriology, now on ozempic once a week, and Tresiba 30 units, recent A1C 6.6% , continued on MTF 500mg  BID   CKD- creatinine as been stable, last 1.4. Once a year with Dr. Senaida Lange for nephrology    OAB/ in setting of prostate cancer-reviewed radiation oncology note form [facility]    Colonoscopy done in 2011, has occ blood since his radiation treatmnet  For  Due for repeat     OSA- needs new mask and supplies, feels good when he wears CPAP     Review Of Systems:  GEN- denies fatigue, fever, weight loss,weakness, recent illness HEENT- denies eye drainage, change in vision, nasal discharge, CVS- denies chest pain, palpitations RESP- denies SOB, cough, wheeze ABD- denies N/V, change in stools, abd pain GU- denies dysuria, hematuria, dribbling, incontinence MSK- denies joint pain, muscle aches, injury Neuro- denies headache, dizziness, syncope, seizure activity       Objective:    BP 120/68   Pulse 70   Temp 98.2 F (36.8 C) (Oral)   Resp 16   Ht 6' (1.829 m)   Wt 245 lb (111.1 kg)   SpO2 98%   BMI 33.23 kg/m  GEN- NAD, alert and oriented x3 HEENT- PERRL, EOMI, non injected sclera, pink conjunctiva, MMM, oropharynx clear CVS- RRR, no murmur RESP-CTAB ABD-NABS,soft,NT,ND EXT- bilat amputee Pulses- Radial 2+        Assessment & Plan:      Problem List Items Addressed This Visit      Unprioritized   CKD (chronic kidney disease) (Chronic)    Following with nephrology yearly Cr has been stable       DM type 2 causing vascular disease ([place])    Controlled per endocrinology, reviewed last labs and medications       Essential hypertension    Controlled no changes to meds      Mixed hyperlipidemia    Lipids at goal On statin      OSA (obstructive sleep apnea)     Continues to benefit from CPAP Use, supplies ordered        Other Visit Diagnoses    Colon cancer screening    -  Primary      Note: This dictation was prepared with Dragon dictation along with smaller phrase technology. Any transcriptional errors that result from this process are unintentional.

## 2018-05-13 NOTE — Patient Instructions (Addendum)
Referral to GI in [place]  CPAP Mask/ supplies to be ordered- call us with the supplier name  F/U 6 months

## 2018-05-15 ENCOUNTER — Encounter: Payer: Self-pay | Admitting: Family Medicine

## 2018-05-15 NOTE — Assessment & Plan Note (Signed)
Controlled per endocrinology, reviewed last labs and medications

## 2018-05-15 NOTE — Assessment & Plan Note (Signed)
Continues to benefit from CPAP Use, supplies ordered

## 2018-05-15 NOTE — Assessment & Plan Note (Signed)
Lipids at goal On statin

## 2018-05-15 NOTE — Assessment & Plan Note (Signed)
Following with nephrology yearly Cr has been stable

## 2018-05-15 NOTE — Assessment & Plan Note (Signed)
Controlled no changes to meds 

## 2018-05-24 ENCOUNTER — Encounter: Payer: Self-pay | Admitting: Internal Medicine

## 2018-05-29 DIAGNOSIS — C61 Malignant neoplasm of prostate: Secondary | ICD-10-CM | POA: Diagnosis not present

## 2018-06-10 ENCOUNTER — Other Ambulatory Visit: Payer: Self-pay | Admitting: Family Medicine

## 2018-06-12 ENCOUNTER — Other Ambulatory Visit: Payer: Self-pay

## 2018-06-12 MED ORDER — GLUCOSE BLOOD VI STRP: 1.0000 | ORAL_STRIP | Freq: Two times a day (BID) | 2 refills | Status: DC

## 2018-06-18 ENCOUNTER — Other Ambulatory Visit: Payer: Self-pay | Admitting: "Endocrinology

## 2018-06-18 ENCOUNTER — Other Ambulatory Visit: Payer: Self-pay

## 2018-06-18 MED ORDER — GLUCOSE BLOOD VI STRP: ORAL_STRIP | 5 refills | Status: DC

## 2018-06-24 ENCOUNTER — Telehealth: Payer: Self-pay | Admitting: "Endocrinology

## 2018-06-24 NOTE — Telephone Encounter (Signed)
[name] is calling stating that the office has called in the wrong test strips for him he states he is to have Conture Next Test Strips, please advise?

## 2018-06-25 MED ORDER — GLUCOSE BLOOD VI STRP: ORAL_STRIP | 5 refills | Status: DC

## 2018-07-06 ENCOUNTER — Other Ambulatory Visit: Payer: Self-pay | Admitting: "Endocrinology

## 2018-07-14 ENCOUNTER — Other Ambulatory Visit: Payer: Self-pay | Admitting: Family Medicine

## 2018-07-25 DIAGNOSIS — Z6835 Body mass index (BMI) 35.0-35.9, adult: Secondary | ICD-10-CM | POA: Diagnosis not present

## 2018-07-25 DIAGNOSIS — N183 Chronic kidney disease, stage 3 (moderate): Secondary | ICD-10-CM | POA: Diagnosis not present

## 2018-07-25 DIAGNOSIS — I679 Cerebrovascular disease, unspecified: Secondary | ICD-10-CM | POA: Diagnosis not present

## 2018-07-25 DIAGNOSIS — Z89612 Acquired absence of left leg above knee: Secondary | ICD-10-CM | POA: Diagnosis not present

## 2018-07-25 DIAGNOSIS — Z89511 Acquired absence of right leg below knee: Secondary | ICD-10-CM | POA: Diagnosis not present

## 2018-07-25 DIAGNOSIS — I129 Hypertensive chronic kidney disease with stage 1 through stage 4 chronic kidney disease, or unspecified chronic kidney disease: Secondary | ICD-10-CM | POA: Diagnosis not present

## 2018-08-16 DIAGNOSIS — C61 Malignant neoplasm of prostate: Secondary | ICD-10-CM | POA: Diagnosis not present

## 2018-08-27 ENCOUNTER — Ambulatory Visit: Payer: Medicare Other | Admitting: "Endocrinology

## 2018-08-27 DIAGNOSIS — E1159 Type 2 diabetes mellitus with other circulatory complications: Secondary | ICD-10-CM | POA: Diagnosis not present

## 2018-08-28 LAB — COMPLETE METABOLIC PANEL WITH GFR
AG Ratio: 1.2 (calc) (ref 1.0–2.5)
ALBUMIN MSPROF: 3.3 g/dL — AB (ref 3.6–5.1)
ALKALINE PHOSPHATASE (APISO): 92 U/L (ref 40–115)
ALT: 21 U/L (ref 9–46)
AST: 20 U/L (ref 10–35)
BUN / CREAT RATIO: 18 (calc) (ref 6–22)
BUN: 30 mg/dL — AB (ref 7–25)
CALCIUM: 9.1 mg/dL (ref 8.6–10.3)
CO2: 24 mmol/L (ref 20–32)
CREATININE: 1.64 mg/dL — AB (ref 0.70–1.25)
Chloride: 106 mmol/L (ref 98–110)
GFR, EST AFRICAN AMERICAN: 50 mL/min/{1.73_m2} — AB (ref >=60)
GFR, EST NON AFRICAN AMERICAN: 43 mL/min/{1.73_m2} — AB (ref >=60)
GLUCOSE: 139 mg/dL (ref 65–139)
Globulin: 2.7 g/dL (calc) (ref 1.9–3.7)
Potassium: 4.5 mmol/L (ref 3.5–5.3)
Sodium: 142 mmol/L (ref 135–146)
TOTAL PROTEIN: 6 g/dL — AB (ref 6.1–8.1)
Total Bilirubin: 0.3 mg/dL (ref 0.2–1.2)

## 2018-08-28 LAB — HEMOGLOBIN A1C
EAG (MMOL/L): 7.6 (calc)
HEMOGLOBIN A1C: 6.4 %{Hb} — AB (ref <5.7)
MEAN PLASMA GLUCOSE: 137 (calc)

## 2018-08-30 ENCOUNTER — Ambulatory Visit (INDEPENDENT_AMBULATORY_CARE_PROVIDER_SITE_OTHER): Payer: Medicare Other | Admitting: Urology

## 2018-08-30 DIAGNOSIS — N3941 Urge incontinence: Secondary | ICD-10-CM

## 2018-08-30 DIAGNOSIS — R351 Nocturia: Secondary | ICD-10-CM

## 2018-08-30 DIAGNOSIS — C775 Secondary and unspecified malignant neoplasm of intrapelvic lymph nodes: Secondary | ICD-10-CM | POA: Diagnosis not present

## 2018-08-30 DIAGNOSIS — C61 Malignant neoplasm of prostate: Secondary | ICD-10-CM

## 2018-09-02 ENCOUNTER — Ambulatory Visit (INDEPENDENT_AMBULATORY_CARE_PROVIDER_SITE_OTHER): Payer: Medicare Other | Admitting: Gastroenterology

## 2018-09-02 ENCOUNTER — Other Ambulatory Visit: Payer: Self-pay

## 2018-09-02 ENCOUNTER — Encounter: Payer: Self-pay | Admitting: Gastroenterology

## 2018-09-02 DIAGNOSIS — K625 Hemorrhage of anus and rectum: Secondary | ICD-10-CM

## 2018-09-02 DIAGNOSIS — Z86010 Personal history of colonic polyps: Secondary | ICD-10-CM | POA: Diagnosis not present

## 2018-09-02 DIAGNOSIS — Z860101 Personal history of adenomatous and serrated colon polyps: Secondary | ICD-10-CM | POA: Insufficient documentation

## 2018-09-02 MED ORDER — PEG 3350-KCL-NA BICARB-NACL 420 G PO SOLR: 4000.0000 mL | ORAL | 0 refills | Status: DC

## 2018-09-02 NOTE — Progress Notes (Signed)
CC'D TO PCP °

## 2018-09-02 NOTE — Progress Notes (Addendum)
Primary Care Physician:  Alycia Rossetti, MD  Primary Gastroenterologist:  Barney Drain, MD REVIEWED-NO ADDITIONAL RECOMMENDATIONS.  Chief Complaint  Patient presents with  . Colonoscopy    last tcs 7-8 yrs ago  . Blood In Stools    HPI:  [name] is a 67 y.o. male here at the request of Dr. Buelah Manis for further evaluation of blood in the stool.  Patient was diagnosed with prostate cancer last year, he underwent seed implants as well as external beam radiation treatments, completing in December 2018.  Initially with treatment he did have some rectal bleeding pretty regularly.  He states it has tapered off.  Continues to have bright red blood per rectum a couple times per month.  Bowel function otherwise is normal.  Denies any melena.  Occasional heartburn related to certain foods.  No dysphagia, vomiting.  Appetite good.  His last colonoscopy was in 2011, he had a couple of polyps removed, tubular adenomas.  No family history of colon cancer.   Current Outpatient Medications  Medication Sig Dispense Refill  . acetaminophen (TYLENOL) 325 MG tablet Take 1-2 tablets (325-650 mg total) by mouth every 4 (four) hours as needed for mild pain.    Marland Kitchen atenolol (TENORMIN) 25 MG tablet TAKE 1 TABLET BY MOUTH EVERY DAY 90 tablet 1  . atorvastatin (LIPITOR) 40 MG tablet Take 1 tablet (40 mg total) by mouth daily. 30 tablet 6  . B-D UF III MINI PEN NEEDLES 31G X 5 MM MISC USE FOUR TIMES DAILY AS DIRECTED 150 each 2  . Calcium Carb-Cholecalciferol (CALCIUM PLUS VITAMIN D3) 600-500 MG-UNIT CAPS Take 1 capsule by mouth daily.     . clopidogrel (PLAVIX) 75 MG tablet TAKE 1 TABLET BY MOUTH EVERY DAY 90 tablet 1  . CONTOUR TEST test strip TEST TWICE DAILY E11.65 200 each 2  . furosemide (LASIX) 40 MG tablet TAKE 1 TABLET BY MOUTH EVERY DAY 90 tablet 2  . glucose blood (CONTOUR NEXT TEST) test strip Use as instructed bid. E11.65 100 each 5  . glucose blood test strip 1 each by Other route 2 (two) times  daily. Use as instructed bid. E11.65 Contour next 300 each 2  . insulin degludec (TRESIBA FLEXTOUCH) 100 UNIT/ML SOPN FlexTouch Pen Inject 30 Units into the skin at bedtime.     . metFORMIN (GLUCOPHAGE) 500 MG tablet TAKE 1 TABLET BY MOUTH TWO TIMES DAILY WITH FOOD 180 tablet 0  . Semaglutide 0.25 or 0.5 MG/DOSE SOPN Inject 0.5 mg into the skin once a week.     No current facility-administered medications for this visit.     Allergies as of 09/02/2018 - Review Complete 09/02/2018  Allergen Reaction Noted  . Zolpidem tartrate Other (See Comments) 04/11/2011    Past Medical History:  Diagnosis Date  . Arthritis   . Cerebrovascular disease    MRI shows Right carotid inferior cavernours narrowing 75% and  50-75% stenosis of Cavernous and supraclinoi right side  . CKD (chronic kidney disease) 06/28/2014   Sees Dr Florene Glen  . Hyperlipidemia   . Hypertension   . Myocardial infarction ([place])    "mild" heart attack  . Necrosis ([place])    #2 nail   . Pneumonia    as a child  . Poor circulation of extremity   . Prostate cancer ([place])   . Sleep apnea    uses cpap, getting a new one  . Type 2 Diabetes mellitus    Type 2    Past  Surgical History:  Procedure Laterality Date  . ABOVE KNEE LEG AMPUTATION Left 2004   started out below knee and then extended to above knee due to poor healing  . AMPUTATION Right 04/28/2015   Procedure: AMPUTATION RAY, RIGHT 5TH TOE;  Surgeon: Marybelle Killings, MD;  Location: [facility];  Service: Orthopedics;  Laterality: Right;  . AMPUTATION Right 05/10/2015   Procedure: Right Below Knee Amputation;  Surgeon: Marybelle Killings, MD;  Location: [facility];  Service: Orthopedics;  Laterality: Right;  . CATARACT EXTRACTION W/PHACO  09/05/2012   Procedure: CATARACT EXTRACTION PHACO AND INTRAOCULAR LENS PLACEMENT (IOC);  Surgeon: Tonny Branch, MD;  Location: [facility];  Service: Ophthalmology;  Laterality: Left;  CDE=5.45  . CATARACT EXTRACTION W/PHACO  10/03/2012   Procedure: CATARACT  EXTRACTION PHACO AND INTRAOCULAR LENS PLACEMENT (IOC);  Surgeon: Tonny Branch, MD;  Location: [facility];  Service: Ophthalmology;  Laterality: Right;  CDE: 12.31  . COLONOSCOPY  2011   Dr. Deatra Ina: colon polyps, tubular adenoma    Family History  Problem Relation Age of Onset  . Diabetes Mother   . Hypertension Mother   . Cancer Father        lung  . Cancer Sister        breast  . Sickle cell anemia Daughter   . Cancer Maternal Grandfather        prostate  . Colon cancer Neg Hx     Social History   Socioeconomic History  . Marital status: Married    Spouse name: Not on file  . Number of children: 2  . Years of education: college  . Highest education level: Not on file  Occupational History  . Occupation: Mining engineer: [employer]  Social Needs  . Financial resource strain: Not on file  . Food insecurity:    Worry: Not on file    Inability: Not on file  . Transportation needs:    Medical: Not on file    Non-medical: Not on file  Tobacco Use  . Smoking status: Never Smoker  . Smokeless tobacco: Never Used  Substance and Sexual Activity  . Alcohol use: No  . Drug use: No  . Sexual activity: Never  Lifestyle  . Physical activity:    Days per week: Not on file    Minutes per session: Not on file  . Stress: Not on file  Relationships  . Social connections:    Talks on phone: Not on file    Gets together: Not on file    Attends religious service: Not on file    Active member of club or organization: Not on file    Attends meetings of clubs or organizations: Not on file    Relationship status: Not on file  . Intimate partner violence:    Fear of current or ex partner: Not on file    Emotionally abused: Not on file    Physically abused: Not on file    Forced sexual activity: Not on file  Other Topics Concern  . Not on file  Social History Narrative   Patient lives with his wife    Patient right handed   Patient drinks caffine on occ.       ROS:  General: Negative for anorexia, weight loss, fever, chills, fatigue, weakness. Eyes: Negative for vision changes.  ENT: Negative for hoarseness, difficulty swallowing , nasal congestion. CV: Negative for chest pain, angina, palpitations, dyspnea on exertion, peripheral edema.  Respiratory: Negative  for dyspnea at rest, dyspnea on exertion, cough, sputum, wheezing.  GI: See history of present illness. GU:  Negative for dysuria, hematuria, urinary incontinence, urinary frequency, nocturnal urination.  MS: Negative for joint pain, low back pain.  Derm: Negative for rash or itching.  Neuro: Negative for weakness, abnormal sensation, seizure, frequent headaches, memory loss, confusion.  Psych: Negative for anxiety, depression, suicidal ideation, hallucinations.  Endo: Negative for unusual weight change.  Heme: Negative for bruising or bleeding. Allergy: Negative for rash or hives.    Physical Examination:  BP 112/69   Pulse 89   Temp (!) 97.1 F (36.2 C) (Oral)   Ht 5' 11.5" (1.816 m)   Wt 248 lb 12.8 oz (112.9 kg)   BMI 34.22 kg/m    General: Well-nourished, well-developed in no acute distress.  Head: Normocephalic, atraumatic.   Eyes: Conjunctiva pink, no icterus. Mouth: Oropharyngeal mucosa moist and pink , no lesions erythema or exudate. Neck: Supple without thyromegaly, masses, or lymphadenopathy.  Lungs: Clear to auscultation bilaterally.  Heart: Regular rate and rhythm, no murmurs rubs or gallops.  Abdomen: Bowel sounds are normal, nontender, nondistended, no hepatosplenomegaly or masses, no abdominal bruits or    hernia , no rebound or guarding.  Exam limited as patient preferred being examined in the chair as opposed to trying to get on the exam table due to limited mobility. Rectal: Deferred  extremities: Status post bilateral lower extremity amputations Neuro: Alert and oriented x 4 , grossly normal neurologically.  Skin: Warm and dry, no rash or jaundice.    Psych: Alert and cooperative, normal mood and affect.  Labs: Lab Results  Component Value Date   CREATININE 1.64 (H) 08/27/2018   BUN 30 (H) 08/27/2018   NA 142 08/27/2018   K 4.5 08/27/2018   CL 106 08/27/2018   CO2 24 08/27/2018   Lab Results  Component Value Date   ALT 21 08/27/2018   AST 20 08/27/2018   ALKPHOS 74 05/15/2017   BILITOT 0.3 08/27/2018   Lab Results  Component Value Date   WBC 4.5 01/08/2018   HGB 11.3 (L) 01/08/2018   HCT 34.2 (L) 01/08/2018   MCV 89.8 01/08/2018   PLT 230 01/08/2018     Imaging Studies: No results found.

## 2018-09-02 NOTE — Assessment & Plan Note (Signed)
Very pleasant 67 year old gentleman with prostate cancer status post radiation diabetes mellitus, chronic kidney disease, bilateral lower extremity amputations who presents for scheduling colonoscopy.  He has intermittent bright red blood per rectum since radiation last year.  Likely radiation proctitis.  He has a history of colonic adenomas in 2011.  Would recommend colonoscopy at this time for surveillance and diagnostic purposes.  I have discussed the risks, alternatives, benefits with regards to but not limited to the risk of reaction to medication, bleeding, infection, perforation and the patient is agreeable to proceed. Written consent to be obtained.

## 2018-09-02 NOTE — Patient Instructions (Signed)
Colonoscopy as scheduled. See separate instructions.  

## 2018-09-13 ENCOUNTER — Other Ambulatory Visit: Payer: Self-pay | Admitting: "Endocrinology

## 2018-09-23 ENCOUNTER — Encounter: Payer: Self-pay | Admitting: "Endocrinology

## 2018-09-23 ENCOUNTER — Ambulatory Visit (INDEPENDENT_AMBULATORY_CARE_PROVIDER_SITE_OTHER): Payer: Medicare Other | Admitting: "Endocrinology

## 2018-09-23 VITALS — BP 120/72 | HR 69 | Ht 71.5 in | Wt 248.0 lb

## 2018-09-23 DIAGNOSIS — E782 Mixed hyperlipidemia: Secondary | ICD-10-CM | POA: Diagnosis not present

## 2018-09-23 DIAGNOSIS — I10 Essential (primary) hypertension: Secondary | ICD-10-CM | POA: Diagnosis not present

## 2018-09-23 DIAGNOSIS — E1159 Type 2 diabetes mellitus with other circulatory complications: Secondary | ICD-10-CM

## 2018-09-23 NOTE — Progress Notes (Signed)
Endocrinology follow-up note   Subjective:    Patient ID: [name], male    DOB: 03-23-1951, PCP Alycia Rossetti, MD   Past Medical History:  Diagnosis Date  . Arthritis   . Cerebrovascular disease    MRI shows Right carotid inferior cavernours narrowing 75% and  50-75% stenosis of Cavernous and supraclinoi right side  . CKD (chronic kidney disease) 06/28/2014   Sees Dr Florene Glen  . Hyperlipidemia   . Hypertension   . Myocardial infarction ([place])    "mild" heart attack  . Necrosis ([place])    #2 nail   . Pneumonia    as a child  . Poor circulation of extremity   . Prostate cancer ([place])   . Sleep apnea    uses cpap, getting a new one  . Type 2 Diabetes mellitus    Type 2   Past Surgical History:  Procedure Laterality Date  . ABOVE KNEE LEG AMPUTATION Left 2004   started out below knee and then extended to above knee due to poor healing  . AMPUTATION Right 04/28/2015   Procedure: AMPUTATION RAY, RIGHT 5TH TOE;  Surgeon: Marybelle Killings, MD;  Location: [facility];  Service: Orthopedics;  Laterality: Right;  . AMPUTATION Right 05/10/2015   Procedure: Right Below Knee Amputation;  Surgeon: Marybelle Killings, MD;  Location: [facility];  Service: Orthopedics;  Laterality: Right;  . CATARACT EXTRACTION W/PHACO  09/05/2012   Procedure: CATARACT EXTRACTION PHACO AND INTRAOCULAR LENS PLACEMENT (IOC);  Surgeon: Tonny Branch, MD;  Location: [facility];  Service: Ophthalmology;  Laterality: Left;  CDE=5.45  . CATARACT EXTRACTION W/PHACO  10/03/2012   Procedure: CATARACT EXTRACTION PHACO AND INTRAOCULAR LENS PLACEMENT (IOC);  Surgeon: Tonny Branch, MD;  Location: [facility];  Service: Ophthalmology;  Laterality: Right;  CDE: 12.31  . COLONOSCOPY  2011   Dr. Deatra Ina: colon polyps, tubular adenoma   Social History   Socioeconomic History  . Marital status: Married    Spouse name: Not on file  . Number of children: 2  . Years of education: college  . Highest education level: Not on file  Occupational History  .  Occupation: Mining engineer: [employer]  Social Needs  . Financial resource strain: Not on file  . Food insecurity:    Worry: Not on file    Inability: Not on file  . Transportation needs:    Medical: Not on file    Non-medical: Not on file  Tobacco Use  . Smoking status: Never Smoker  . Smokeless tobacco: Never Used  Substance and Sexual Activity  . Alcohol use: No  . Drug use: No  . Sexual activity: Never  Lifestyle  . Physical activity:    Days per week: Not on file    Minutes per session: Not on file  . Stress: Not on file  Relationships  . Social connections:    Talks on phone: Not on file    Gets together: Not on file    Attends religious service: Not on file    Active member of club or organization: Not on file    Attends meetings of clubs or organizations: Not on file    Relationship status: Not on file  Other Topics Concern  . Not on file  Social History Narrative   Patient lives with his wife    Patient right handed   Patient drinks caffine on occ.   Outpatient Encounter Medications as of 09/23/2018  Medication  Sig  . acetaminophen (TYLENOL) 325 MG tablet Take 1-2 tablets (325-650 mg total) by mouth every 4 (four) hours as needed for mild pain.  Marland Kitchen atenolol (TENORMIN) 25 MG tablet TAKE 1 TABLET BY MOUTH EVERY DAY  . atorvastatin (LIPITOR) 40 MG tablet Take 1 tablet (40 mg total) by mouth daily.  . B-D UF III MINI PEN NEEDLES 31G X 5 MM MISC USE FOUR TIMES DAILY AS DIRECTED  . Calcium Carb-Cholecalciferol (CALCIUM PLUS VITAMIN D3) 600-500 MG-UNIT CAPS Take 1 capsule by mouth daily.   . clopidogrel (PLAVIX) 75 MG tablet TAKE 1 TABLET BY MOUTH EVERY DAY  . CONTOUR TEST test strip TEST TWICE DAILY E11.65  . furosemide (LASIX) 40 MG tablet TAKE 1 TABLET BY MOUTH EVERY DAY  . glucose blood (CONTOUR NEXT TEST) test strip Use as instructed bid. E11.65  . glucose blood test strip 1 each by Other route 2 (two) times daily. Use as instructed bid. E11.65  Contour next  . insulin degludec (TRESIBA FLEXTOUCH) 100 UNIT/ML SOPN FlexTouch Pen Inject 30 Units into the skin at bedtime.   . metFORMIN (GLUCOPHAGE) 500 MG tablet TAKE 1 TABLET BY MOUTH TWO TIMES DAILY WITH FOOD  . polyethylene glycol-electrolytes (TRILYTE) 420 g solution Take 4,000 mLs by mouth as directed.  . Semaglutide 0.25 or 0.5 MG/DOSE SOPN Inject 0.5 mg into the skin once a week.   No facility-administered encounter medications on file as of 09/23/2018.    ALLERGIES: Allergies  Allergen Reactions  . Zolpidem Tartrate Other (See Comments)    disorientation    VACCINATION STATUS: Immunization History  Administered Date(s) Administered  . Influenza Split 06/30/2013, 08/30/2013  . Influenza-Unspecified 07/30/2015  . Pneumococcal Conjugate-13 01/08/2018  . Pneumococcal Polysaccharide-23 07/31/2007  . Td 10/30/2002  . Tdap 06/19/2016  . Zoster 06/19/2016    Diabetes  He presents for his follow-up diabetic visit. He has type 2 diabetes mellitus. Onset time: He was diagnosed at approximate age of 55 years. His disease course has been improving. There are no hypoglycemic associated symptoms. Pertinent negatives for hypoglycemia include no confusion, headaches, pallor or seizures. There are no diabetic associated symptoms. Pertinent negatives for diabetes include no chest pain, no fatigue, no polydipsia, no polyphagia, no polyuria and no weakness. There are no hypoglycemic complications. Symptoms are improving. Diabetic complications include nephropathy and PVD. (Status post bilateral lower extremity amputations. He is wheelchair-bound, awaiting for leg prosthetics.) Risk factors for coronary artery disease include diabetes mellitus, dyslipidemia, hypertension, male sex, obesity, sedentary lifestyle and tobacco exposure. Current diabetic treatment includes insulin injections (He could not afford [place], [place], due to a gap in his insurance, currently on the on metformin 500 minute  grams by mouth twice a day.). He is compliant with treatment most of the time. His weight is fluctuating minimally. He is following a diabetic diet. When asked about meal planning, he reported none. He has had a previous visit with a dietitian. He never participates in exercise. His home blood glucose trend is decreasing steadily. His breakfast blood glucose range is generally 130-140 mg/dl. His bedtime blood glucose range is generally 130-140 mg/dl. His overall blood glucose range is 130-140 mg/dl. An ACE inhibitor/angiotensin II receptor blocker is being taken.  Hyperlipidemia  This is a chronic problem. The current episode started more than 1 year ago. The problem is controlled. Pertinent negatives include no chest pain, myalgias or shortness of breath. Current antihyperlipidemic treatment includes statins. Risk factors for coronary artery disease include dyslipidemia, diabetes mellitus, hypertension, male sex,  obesity and a sedentary lifestyle.  Hypertension  This is a chronic problem. The current episode started more than 1 year ago. Pertinent negatives include no chest pain, headaches, neck pain, palpitations or shortness of breath. Risk factors for coronary artery disease include diabetes mellitus, dyslipidemia, male gender, obesity, sedentary lifestyle and smoking/tobacco exposure. Hypertensive end-organ damage includes PVD.    Review of Systems  Constitutional: Negative for fatigue and unexpected weight change.  HENT: Negative for dental problem, mouth sores and trouble swallowing.   Eyes: Negative for visual disturbance.  Respiratory: Negative for cough, choking, chest tightness, shortness of breath and wheezing.   Cardiovascular: Negative for chest pain, palpitations and leg swelling.  Gastrointestinal: Negative for abdominal distention, abdominal pain, constipation, diarrhea, nausea and vomiting.  Endocrine: Negative for polydipsia, polyphagia and polyuria.  Genitourinary: Negative for  dysuria, flank pain, hematuria and urgency.  Musculoskeletal: Positive for gait problem. Negative for back pain, myalgias and neck pain.       He now walks with bilateral leg prostheses and crutches.  Skin: Negative for pallor, rash and wound.  Neurological: Negative for seizures, syncope, weakness, numbness and headaches.  Psychiatric/Behavioral: Negative.  Negative for confusion, dysphoric mood and suicidal ideas.    Objective:    BP 120/72   Pulse 69   Ht 5' 11.5" (1.816 m)   Wt 248 lb (112.5 kg)   BMI 34.11 kg/m   Wt Readings from Last 3 Encounters:  09/23/18 248 lb (112.5 kg)  09/02/18 248 lb 12.8 oz (112.9 kg)  05/13/18 245 lb (111.1 kg)    Physical Exam  Constitutional: He is oriented to person, place, and time. He appears well-developed. He is cooperative. No distress.  HENT:  Head: Normocephalic and atraumatic.  Eyes: EOM are normal.  Neck: Normal range of motion. Neck supple. No tracheal deviation present. No thyromegaly present.  Cardiovascular: Normal rate, S1 normal and S2 normal. Exam reveals no gallop.  No murmur heard. Pulses:      Dorsalis pedis pulses are 1+ on the right side, and 1+ on the left side.       Posterior tibial pulses are 1+ on the right side, and 1+ on the left side.  Pulmonary/Chest: Effort normal. No respiratory distress. He has no wheezes.  Abdominal: He exhibits no distension. There is no tenderness. There is no guarding and no CVA tenderness.  Musculoskeletal:       Right shoulder: He exhibits no swelling and no deformity.  He now walks with bilateral leg prostheses and crutches.  Neurological: He is alert and oriented to person, place, and time. He has normal strength. No cranial nerve deficit or sensory deficit. Gait normal.  Skin: Skin is warm and dry. No rash noted. No cyanosis. Nails show no clubbing.  Psychiatric: He has a normal mood and affect. His speech is normal. Judgment normal. Cognition and memory are normal.    Results for  orders placed or performed in visit on 04/24/18  COMPLETE METABOLIC PANEL WITH GFR  Result Value Ref Range   Glucose, Bld 139 65 - 139 mg/dL   BUN 30 (H) 7 - 25 mg/dL   Creat 1.64 (H) 0.70 - 1.25 mg/dL   GFR, Est Non African American 43 (L) > OR = 60 mL/min/1.7m2   GFR, Est African American 50 (L) > OR = 60 mL/min/1.23m2   BUN/Creatinine Ratio 18 6 - 22 (calc)   Sodium 142 135 - 146 mmol/L   Potassium 4.5 3.5 - 5.3 mmol/L   Chloride  106 98 - 110 mmol/L   CO2 24 20 - 32 mmol/L   Calcium 9.1 8.6 - 10.3 mg/dL   Total Protein 6.0 (L) 6.1 - 8.1 g/dL   Albumin 3.3 (L) 3.6 - 5.1 g/dL   Globulin 2.7 1.9 - 3.7 g/dL (calc)   AG Ratio 1.2 1.0 - 2.5 (calc)   Total Bilirubin 0.3 0.2 - 1.2 mg/dL   Alkaline phosphatase (APISO) 92 40 - 115 U/L   AST 20 10 - 35 U/L   ALT 21 9 - 46 U/L  Hemoglobin A1c  Result Value Ref Range   Hgb A1c MFr Bld 6.4 (H) <5.7 % of total Hgb   Mean Plasma Glucose 137 (calc)   eAG (mmol/L) 7.6 (calc)   Complete Blood Count (Most recent): Lab Results  Component Value Date   WBC 4.5 01/08/2018   HGB 11.3 (L) 01/08/2018   HCT 34.2 (L) 01/08/2018   MCV 89.8 01/08/2018   PLT 230 01/08/2018   Chemistry (most recent): Lab Results  Component Value Date   NA 142 08/27/2018   K 4.5 08/27/2018   CL 106 08/27/2018   CO2 24 08/27/2018   BUN 30 (H) 08/27/2018   CREATININE 1.64 (H) 08/27/2018   Diabetic Labs (most recent): Lab Results  Component Value Date   HGBA1C 6.4 (H) 08/27/2018   HGBA1C 6.6 (H) 04/18/2018   HGBA1C 6.5 (H) 12/18/2017   Lipid Panel     Component Value Date/Time   CHOL 106 01/08/2018 1024   CHOL 158 02/23/2015 0931   TRIG 93 01/08/2018 1024   TRIG 98 10/27/2013 1305   HDL 36 (L) 01/08/2018 1024   HDL 55 02/23/2015 0931   HDL 39 (L) 10/27/2013 1305   CHOLHDL 2.9 01/08/2018 1024   VLDL 14 10/31/2016 1137   LDLCALC 52 01/08/2018 1024   LDLCALC 82 10/27/2013 1305    Assessment & Plan:   1. Type 2 diabetes mellitus with other  circulatory complications (HCC)  -His  diabetes is  complicated by extensive peripheral arterial disease with bilateral lower extremity amputations and patient remains at a high risk for more acute and chronic complications of diabetes which include CAD, CVA, CKD, retinopathy, and neuropathy. These are all discussed in detail with the patient.  Patient came with continued improvement in his glycemic profile and A1c of 6.4%, overall improving from 7.6%.     Recent labs reviewed, showing stage stable  3 renal insufficiency.   - I have re-counseled the patient on diet management and weight loss  by adopting a carbohydrate restricted / protein rich  Diet.  -  Suggestion is made for him to avoid simple carbohydrates  from his diet including Cakes, Sweet Desserts / Pastries, Ice Cream, Soda (diet and regular), Sweet Tea, Candies, Chips, Cookies, Store Bought Juices, Alcohol in Excess of  1-2 drinks a day, Artificial Sweeteners, and "Sugar-free" Products. This will help patient to have stable blood glucose profile and potentially avoid unintended weight gain.  - Patient is advised to stick to a routine mealtimes to eat 3 meals  a day and avoid unnecessary snacks ( to snack only to correct hypoglycemia).   - I have approached patient with the following individualized plan to manage diabetes and patient agrees.  - His insurance did not provide coverage for [place].  He is given samples of Tresiba, and advised to continue 30 units nightly associated with strict monitoring of blood glucose daily before breakfast, and at any other time as needed.    -  His insurance did not provide coverage for Trulicity.  I gave him samples of Ozempic to use 0.5 mg subcutaneously weekly.    - He has stable stage  2-3 renal insufficiency, not a candidate for full dose metformin.  -He is tolerating low-dose metformin.  I discussed and continued metformin 500 mg p.o. twice daily-after breakfast and after supper.  Precautions  with hydration advised.   - Patient specific target  for A1c; LDL, HDL, Triglycerides, and  Waist Circumference were discussed in detail.  2) BP/HTN: His blood pressure is controlled to target.   He is advised to continue his current blood pressure medications including atenolol 25 mg p.o. daily and as needed Lasix.   3) Lipids/HPL: His recent lipid panel showed controlled LDL at 52.  He will be considered for lower dose of statin.  I lowered his atorvastatin to 40 mg nightly from 80 mg.   4)  Weight/Diet: CDE consult in progress, exercise, and carbohydrates information provided.  5) Chronic Care/Health Maintenance:  -Patient is on Statin medications and encouraged to continue to follow up with Ophthalmology, urology given his recent diagnosis of prostate cancer, Podiatrist at least yearly or according to recommendations, and advised to  stay away from smoking. I have recommended yearly flu vaccine and pneumonia vaccination at least every 5 years; moderate intensity exercise for up to 150 minutes weekly; and  sleep for at least 7 hours a day.  - I advised patient to maintain close follow up with Alycia Rossetti, MD for primary care needs.  - Time spent with the patient: 25 min, of which >50% was spent in reviewing his blood glucose logs , discussing his hypo- and hyper-glycemic episodes, reviewing his current and  previous labs and insulin doses and developing a plan to avoid hypo- and hyper-glycemia. Please refer to Patient Instructions for Blood Glucose Monitoring and Insulin/Medications Dosing Guide"  in media tab for additional information. Ozella Almond Jerde participated in the discussions, expressed understanding, and voiced agreement with the above plans.  All questions were answered to his satisfaction. he is encouraged to contact clinic should he have any questions or concerns prior to his return visit.   Follow up plan: -Return in about 4 months (around 01/22/2019) for Meter, and  Logs.  Glade Lloyd, MD Phone: [phone]  Fax: [phone]  -  This note was partially dictated with voice recognition software. Similar sounding words can be transcribed inadequately or may not  be corrected upon review.  09/23/2018, 9:53 AM

## 2018-09-23 NOTE — Patient Instructions (Signed)

## 2018-09-25 DIAGNOSIS — C61 Malignant neoplasm of prostate: Secondary | ICD-10-CM | POA: Diagnosis not present

## 2018-09-25 DIAGNOSIS — E119 Type 2 diabetes mellitus without complications: Secondary | ICD-10-CM | POA: Diagnosis not present

## 2018-10-03 ENCOUNTER — Encounter: Payer: Self-pay | Admitting: General Practice

## 2018-10-28 ENCOUNTER — Other Ambulatory Visit: Payer: Self-pay | Admitting: "Endocrinology

## 2018-10-29 ENCOUNTER — Telehealth: Payer: Self-pay | Admitting: Internal Medicine

## 2018-10-29 NOTE — Telephone Encounter (Signed)
Called spoke with spouse. I discussed instructions with patient spouse in detail. She had his old instructions marked on this with new info.

## 2018-10-29 NOTE — Telephone Encounter (Signed)
Pt's wife called to say that they haven't received the new prep instructions for patient that is scheduled with [facility] on Friday. I told her that I printed a copy out if they wanted to come by today before 5pm to pick them up. Wife is also scheduled colonoscopy and wanted to know if patient could follow her instructions if they were the same. She was going to find her instructions and call back. Please advise and call her at [phone]

## 2018-11-01 ENCOUNTER — Other Ambulatory Visit: Payer: Self-pay

## 2018-11-01 ENCOUNTER — Encounter (HOSPITAL_COMMUNITY)
Admission: RE | Disposition: A | Discharge status: Home / Self Care | Payer: Self-pay | Source: Home / Self Care | Attending: Internal Medicine

## 2018-11-01 ENCOUNTER — Encounter (HOSPITAL_COMMUNITY): Payer: Self-pay | Admitting: *Deleted

## 2018-11-01 ENCOUNTER — Ambulatory Visit (HOSPITAL_COMMUNITY)
Admission: RE | Admit: 2018-11-01 | Discharge: 2018-11-01 | Disposition: A | Discharge status: Home / Self Care | Payer: Medicare Other | Attending: Internal Medicine | Admitting: Internal Medicine

## 2018-11-01 DIAGNOSIS — N189 Chronic kidney disease, unspecified: Secondary | ICD-10-CM | POA: Diagnosis not present

## 2018-11-01 DIAGNOSIS — K625 Hemorrhage of anus and rectum: Secondary | ICD-10-CM

## 2018-11-01 DIAGNOSIS — Z89511 Acquired absence of right leg below knee: Secondary | ICD-10-CM | POA: Diagnosis not present

## 2018-11-01 DIAGNOSIS — D120 Benign neoplasm of cecum: Secondary | ICD-10-CM | POA: Diagnosis not present

## 2018-11-01 DIAGNOSIS — D123 Benign neoplasm of transverse colon: Secondary | ICD-10-CM

## 2018-11-01 DIAGNOSIS — M1990 Unspecified osteoarthritis, unspecified site: Secondary | ICD-10-CM | POA: Diagnosis not present

## 2018-11-01 DIAGNOSIS — K627 Radiation proctitis: Secondary | ICD-10-CM | POA: Diagnosis not present

## 2018-11-01 DIAGNOSIS — I129 Hypertensive chronic kidney disease with stage 1 through stage 4 chronic kidney disease, or unspecified chronic kidney disease: Secondary | ICD-10-CM | POA: Insufficient documentation

## 2018-11-01 DIAGNOSIS — G4730 Sleep apnea, unspecified: Secondary | ICD-10-CM | POA: Diagnosis not present

## 2018-11-01 DIAGNOSIS — Z86010 Personal history of colonic polyps: Secondary | ICD-10-CM

## 2018-11-01 DIAGNOSIS — K921 Melena: Secondary | ICD-10-CM | POA: Diagnosis not present

## 2018-11-01 DIAGNOSIS — Z8546 Personal history of malignant neoplasm of prostate: Secondary | ICD-10-CM | POA: Diagnosis not present

## 2018-11-01 DIAGNOSIS — Z79899 Other long term (current) drug therapy: Secondary | ICD-10-CM | POA: Insufficient documentation

## 2018-11-01 DIAGNOSIS — I252 Old myocardial infarction: Secondary | ICD-10-CM | POA: Diagnosis not present

## 2018-11-01 DIAGNOSIS — Z7984 Long term (current) use of oral hypoglycemic drugs: Secondary | ICD-10-CM | POA: Diagnosis not present

## 2018-11-01 DIAGNOSIS — E785 Hyperlipidemia, unspecified: Secondary | ICD-10-CM | POA: Insufficient documentation

## 2018-11-01 DIAGNOSIS — E1122 Type 2 diabetes mellitus with diabetic chronic kidney disease: Secondary | ICD-10-CM | POA: Insufficient documentation

## 2018-11-01 HISTORY — PX: POLYPECTOMY: SHX5525

## 2018-11-01 HISTORY — PX: COLONOSCOPY: SHX5424

## 2018-11-01 LAB — GLUCOSE, CAPILLARY: Glucose-Capillary: 98 mg/dL (ref 70–99)

## 2018-11-01 SURGERY — COLONOSCOPY
Anesthesia: Moderate Sedation

## 2018-11-01 MED ORDER — MEPERIDINE HCL 50 MG/ML IJ SOLN
INTRAMUSCULAR | Status: AC
  Filled 2018-11-01: qty 1

## 2018-11-01 MED ORDER — STERILE WATER FOR IRRIGATION IR SOLN
Status: DC | PRN
  Administered 2018-11-01: 1.5 mL

## 2018-11-01 MED ORDER — MIDAZOLAM HCL 5 MG/5ML IJ SOLN
INTRAMUSCULAR | Status: AC
  Filled 2018-11-01: qty 10

## 2018-11-01 MED ORDER — SODIUM CHLORIDE 0.9 % IV SOLN
INTRAVENOUS | Status: DC
  Administered 2018-11-01: 1000 mL via INTRAVENOUS

## 2018-11-01 MED ORDER — ONDANSETRON HCL 4 MG/2ML IJ SOLN
INTRAMUSCULAR | Status: AC
  Filled 2018-11-01: qty 2

## 2018-11-01 MED ORDER — MIDAZOLAM HCL 5 MG/5ML IJ SOLN
INTRAMUSCULAR | Status: DC | PRN
  Administered 2018-11-01 (×3): 1 mg via INTRAVENOUS

## 2018-11-01 MED ORDER — MEPERIDINE HCL 100 MG/ML IJ SOLN
INTRAMUSCULAR | Status: DC | PRN
  Administered 2018-11-01: 25 mg

## 2018-11-01 MED ORDER — ONDANSETRON HCL 4 MG/2ML IJ SOLN
INTRAMUSCULAR | Status: DC | PRN
  Administered 2018-11-01: 4 mg via INTRAVENOUS

## 2018-11-01 NOTE — Op Note (Signed)
[facility] Patient Name: [name] Procedure Date: 11/01/2018 7:24 AM MRN: [id number] Date of Birth: 03-12-1951 Attending MD: Norvel Richards , MD CSN: [id number] Age: 68 Admit Type: Outpatient Procedure:                Colonoscopy Indications:              Hematochezia Providers:                Norvel Richards, MD, Gerome Sam, RN,                            Aram Candela Referring MD:              Medicines:                Midazolam 3 mg IV, Meperidine 25 mg IV, Propofol                            per Anesthesia, Ondansetron 4 mg IV Complications:            No immediate complications. Estimated Blood Loss:     Estimated blood loss was minimal. Procedure:                Pre-Anesthesia Assessment:                           - Prior to the procedure, a History and Physical                            was performed, and patient medications and                            allergies were reviewed. The patient's tolerance of                            previous anesthesia was also reviewed. The risks                            and benefits of the procedure and the sedation                            options and risks were discussed with the patient.                            All questions were answered, and informed consent                            was obtained. Prior Anticoagulants: The patient                            last took Plavix (clopidogrel) 1 day prior to the                            procedure. ASA Grade Assessment: III - A patient  with severe systemic disease. After reviewing the                            risks and benefits, the patient was deemed in                            satisfactory condition to undergo the procedure.                           After obtaining informed consent, the colonoscope                            was passed under direct vision. Throughout the                            procedure, the patient's blood  pressure, pulse, and                            oxygen saturations were monitored continuously. The                            [id number]-[id number] ([id number]) scope was introduced through                            the anus and advanced to the the cecum, identified                            by appendiceal orifice and ileocecal valve. The                            colonoscopy was performed without difficulty. The                            patient tolerated the procedure well. The quality                            of the bowel preparation was adequate. The                            ileocecal valve, appendiceal orifice, and rectum                            were photographed. The entire colon was well                            visualized. Findings:      Revealed blood in the rectal vault. Digital rectal exam revealed blood.       Vascular pattern of the rectum abnormal neovascular changes distally and       active oozing. No mass or tumor.      Three sessile polyps were found in the splenic flexure and cecum. The       polyps were 2 to 5 mm in size. These polyps were removed with a cold       snare. Resection and  retrieval were complete. Estimated blood loss was       minimal. APC circular prime used to ablate the abnormal vascular tissue       in the rectum. Multiple applications. Good hemostasis achieved. Impression:               - Three 2 to 5 mm polyps at the splenic flexure and                            in the cecum, removed with a cold snare. Resected                            and retrieved. Cecal polyp not recovered. Radiation                            proctitis with active bleeding. Status post APC                            treatment. Moderate Sedation:      Moderate (conscious) sedation was administered by the endoscopy nurse       and supervised by the endoscopist. The following parameters were       monitored: oxygen saturation, heart rate, blood pressure, respiratory       rate,  EKG, adequacy of pulmonary ventilation, and response to care.       Total physician intraservice time was 31 minutes. Recommendation:           - Patient has a contact number available for                            emergencies. The signs and symptoms of potential                            delayed complications were discussed with the                            patient. Return to normal activities tomorrow.                            Written discharge instructions were provided to the                            patient.                           - Advance diet as tolerated.                           - Continue present medications.                           - Repeat colonoscopy date to be determined after                            pending pathology results are reviewed for  surveillance based on pathology results.                           - Return to GI clinic in 3 months. Procedure Code(s):        --- Professional ---                           [phone], Colonoscopy, flexible; with removal of                            tumor(s), polyp(s), or other lesion(s) by snare                            technique                           99153, Moderate sedation; each additional 15                            minutes intraservice time                           G0500, Moderate sedation services provided by the                            same physician or other qualified health care                            professional performing a gastrointestinal                            endoscopic service that sedation supports,                            requiring the presence of an independent trained                            observer to assist in the monitoring of the                            patient's level of consciousness and physiological                            status; initial 15 minutes of intra-service time;                            patient age 5 years or older  (additional time may                            be reported with [phone], as appropriate) Diagnosis Code(s):        --- Professional ---                           D12.3, Benign neoplasm of transverse colon (hepatic  flexure or splenic flexure)                           D12.0, Benign neoplasm of cecum                           K92.1, Melena (includes Hematochezia) CPT copyright 2018 American Medical Association. All rights reserved. The codes documented in this report are preliminary and upon coder review may  be revised to meet current compliance requirements. Cristopher Estimable. Evora Schechter, MD Norvel Richards, MD 11/01/2018 8:25:50 AM This report has been signed electronically. Number of Addenda: 0

## 2018-11-01 NOTE — H&P (Signed)
@LOGO @   Primary Care Physician:  Alycia Rossetti, MD Primary Gastroenterologist:  Dr. Gala Romney  Pre-Procedure History & Physical: HPI:  [name] is a 68 y.o. male here for further evaluation of rectal bleeding via colonoscopy.  Negative colonoscopy 2011.  History of right radiation/seed implants for prostate cancer.  Past Medical History:  Diagnosis Date  . Arthritis   . Cerebrovascular disease    MRI shows Right carotid inferior cavernours narrowing 75% and  50-75% stenosis of Cavernous and supraclinoi right side  . CKD (chronic kidney disease) 06/28/2014   Sees Dr Florene Glen  . Hyperlipidemia   . Hypertension   . Myocardial infarction ([place])    "mild" heart attack  . Necrosis ([place])    #2 nail   . Pneumonia    as a child  . Poor circulation of extremity   . Prostate cancer ([place]) 2017   Prostate  . Sleep apnea    uses cpap, getting a new one  . Type 2 Diabetes mellitus    Type 2    Past Surgical History:  Procedure Laterality Date  . ABOVE KNEE LEG AMPUTATION Left 2004   started out below knee and then extended to above knee due to poor healing  . AMPUTATION Right 04/28/2015   Procedure: AMPUTATION RAY, RIGHT 5TH TOE;  Surgeon: Marybelle Killings, MD;  Location: [place];  Service: Orthopedics;  Laterality: Right;  . AMPUTATION Right 05/10/2015   Procedure: Right Below Knee Amputation;  Surgeon: Marybelle Killings, MD;  Location: [facility];  Service: Orthopedics;  Laterality: Right;  . CATARACT EXTRACTION W/PHACO  09/05/2012   Procedure: CATARACT EXTRACTION PHACO AND INTRAOCULAR LENS PLACEMENT (IOC);  Surgeon: Tonny Branch, MD;  Location: [facility];  Service: Ophthalmology;  Laterality: Left;  CDE=5.45  . CATARACT EXTRACTION W/PHACO  10/03/2012   Procedure: CATARACT EXTRACTION PHACO AND INTRAOCULAR LENS PLACEMENT (IOC);  Surgeon: Tonny Branch, MD;  Location: [facility];  Service: Ophthalmology;  Laterality: Right;  CDE: 12.31  . COLONOSCOPY  2011   Dr. Deatra Ina: colon polyps, tubular adenoma    Prior to  Admission medications   Medication Sig Start Date End Date Taking? Authorizing Provider  acetaminophen (TYLENOL) 500 MG tablet Take 1,000 mg by mouth daily as needed for moderate pain.   Yes [provider]  atenolol (TENORMIN) 25 MG tablet TAKE 1 TABLET BY MOUTH EVERY DAY 07/15/18  Yes Barney, Modena Nunnery, MD  atorvastatin (LIPITOR) 80 MG tablet Take 80 mg by mouth daily.   Yes [provider]  Calcium Carb-Cholecalciferol (CALCIUM PLUS VITAMIN D3) 600-500 MG-UNIT CAPS Take 2 capsules by mouth daily.    Yes [provider]  clopidogrel (PLAVIX) 75 MG tablet TAKE 1 TABLET BY MOUTH EVERY DAY 06/10/18  Yes [place], Modena Nunnery, MD  furosemide (LASIX) 40 MG tablet TAKE 1 TABLET BY MOUTH EVERY DAY 07/15/18  Yes [place], Modena Nunnery, MD  insulin degludec (TRESIBA FLEXTOUCH) 100 UNIT/ML SOPN FlexTouch Pen Inject 30 Units into the skin at bedtime.  09/18/17  Yes [provider]  metFORMIN (GLUCOPHAGE) 500 MG tablet Take 1 tablet (500 mg total) by mouth 2 (two) times daily. 10/28/18  Yes N[place], Marella Chimes, MD  polyethylene glycol-electrolytes (TRILYTE) 420 g solution Take 4,000 mLs by mouth as directed. 09/02/18  Yes [place], Marga Melnick, MD  Semaglutide 0.25 or 0.5 MG/DOSE SOPN Inject 0.5 mg into the skin every Monday.    Yes [provider]  atorvastatin (LIPITOR) 40 MG tablet Take 1 tablet (40  mg total) by mouth daily. Patient not taking: Reported on 10/18/2018 04/24/18   Cassandria Anger, MD  B-D UF III MINI PEN NEEDLES 31G X 5 MM MISC USE FOUR TIMES DAILY AS DIRECTED 09/16/18   Cassandria Anger, MD  CONTOUR TEST test strip TEST TWICE DAILY E11.65 06/18/18   Cassandria Anger, MD  glucose blood (CONTOUR NEXT TEST) test strip Use as instructed bid. E11.65 06/25/18   Cassandria Anger, MD  glucose blood test strip 1 each by Other route 2 (two) times daily. Use as instructed bid. E11.65 Contour next 06/12/18   Cassandria Anger, MD    Allergies as of  09/02/2018 - Review Complete 09/02/2018  Allergen Reaction Noted  . Zolpidem tartrate Other (See Comments) 04/11/2011    Family History  Problem Relation Age of Onset  . Diabetes Mother   . Hypertension Mother   . Cancer Father        lung  . Cancer Sister        breast  . Sickle cell anemia Daughter   . Cancer Maternal Grandfather        prostate  . Colon cancer Neg Hx     Social History   Socioeconomic History  . Marital status: Married    Spouse name: Not on file  . Number of children: 2  . Years of education: college  . Highest education level: Not on file  Occupational History  . Occupation: Mining engineer: [employer]  Social Needs  . Financial resource strain: Not on file  . Food insecurity:    Worry: Not on file    Inability: Not on file  . Transportation needs:    Medical: Not on file    Non-medical: Not on file  Tobacco Use  . Smoking status: Never Smoker  . Smokeless tobacco: Never Used  Substance and Sexual Activity  . Alcohol use: No  . Drug use: No  . Sexual activity: Never  Lifestyle  . Physical activity:    Days per week: Not on file    Minutes per session: Not on file  . Stress: Not on file  Relationships  . Social connections:    Talks on phone: Not on file    Gets together: Not on file    Attends religious service: Not on file    Active member of club or organization: Not on file    Attends meetings of clubs or organizations: Not on file    Relationship status: Not on file  . Intimate partner violence:    Fear of current or ex partner: Not on file    Emotionally abused: Not on file    Physically abused: Not on file    Forced sexual activity: Not on file  Other Topics Concern  . Not on file  Social History Narrative   Patient lives with his wife    Patient right handed   Patient drinks caffine on occ.    Review of Systems: See HPI, otherwise negative ROS  Physical Exam: BP 109/81   Pulse 80   Temp 97.6 F  (36.4 C) (Oral)   Resp 18   Ht 6' (1.829 m)   Wt 112.9 kg   SpO2 100%   BMI 33.77 kg/m  General:   Alert,  Well-developed, well-nourished, pleasant and cooperative in NAD Neck:  Supple; no masses or thyromegaly. No significant cervical adenopathy. Lungs:  Clear throughout to auscultation.   No wheezes, crackles, or  rhonchi. No acute distress. Heart:  Regular rate and rhythm; no murmurs, clicks, rubs,  or gallops. Abdomen: Non-distended, normal bowel sounds.  Soft and nontender without appreciable mass or hepatosplenomegaly.    Impression/Plan: 68 year old gentleman here for colonoscopy to further evaluate hematochezia.  The risks, benefits, limitations, alternatives and imponderables have been reviewed with the patient. Questions have been answered. All parties are agreeable.      Notice: This dictation was prepared with Dragon dictation along with smaller phrase technology. Any transcriptional errors that result from this process are unintentional and may not be corrected upon review.

## 2018-11-01 NOTE — Discharge Instructions (Signed)
Colon Polyps  Polyps are tissue growths inside the body. Polyps can grow in many places, including the large intestine (colon). A polyp may be a round bump or a mushroom-shaped growth. You could have one polyp or several. Most colon polyps are noncancerous (benign). However, some colon polyps can become cancerous over time. Finding and removing the polyps early can help prevent this. What are the causes? The exact cause of colon polyps is not known. What increases the risk? You are more likely to develop this condition if you:  Have a family history of colon cancer or colon polyps.  Are older than 29 or older than 45 if you are African American.  Have inflammatory bowel disease, such as ulcerative colitis or Crohn's disease.  Have certain hereditary conditions, such as: ? Familial adenomatous polyposis. ? Lynch syndrome. ? Turcot syndrome. ? Peutz-Jeghers syndrome.  Are overweight.  Smoke cigarettes.  Do not get enough exercise.  Drink too much alcohol.  Eat a diet that is high in fat and red meat and low in fiber.  Had childhood cancer that was treated with abdominal radiation. What are the signs or symptoms? Most polyps do not cause symptoms. If you have symptoms, they may include:  Blood coming from your rectum when having a bowel movement.  Blood in your stool. The stool may look dark red or black.  Abdominal pain.  A change in bowel habits, such as constipation or diarrhea. How is this diagnosed? This condition is diagnosed with a colonoscopy. This is a procedure in which a lighted, flexible scope is inserted into the anus and then passed into the colon to examine the area. Polyps are sometimes found when a colonoscopy is done as part of routine cancer screening tests. How is this treated? Treatment for this condition involves removing any polyps that are found. Most polyps can be removed during a colonoscopy. Those polyps will then be tested for cancer. Additional  treatment may be needed depending on the results of testing. Follow these instructions at home: Lifestyle  Maintain a healthy weight, or lose weight if recommended by your health care provider.  Exercise every day or as told by your health care provider.  Do not use any products that contain nicotine or tobacco, such as cigarettes and e-cigarettes. If you need help quitting, ask your health care provider.  If you drink alcohol, limit how much you have: ? 0-1 drink a day for women. ? 0-2 drinks a day for men.  Be aware of how much alcohol is in your drink. In the [place] one drink equals one 12 oz bottle of beer (355 mL), one 5 oz glass of wine (148 mL), or one 1 oz shot of hard liquor (44 mL). Eating and drinking   Eat foods that are high in fiber, such as fruits, vegetables, and whole grains.  Eat foods that are high in calcium and vitamin D, such as milk, cheese, yogurt, eggs, liver, fish, and broccoli.  Limit foods that are high in fat, such as fried foods and desserts.  Limit the amount of red meat and processed meat you eat, such as hot dogs, sausage, bacon, and lunch meats. General instructions  Keep all follow-up visits as told by your health care provider. This is important. ? This includes having regularly scheduled colonoscopies. ? Talk to your health care provider about when you need a colonoscopy. Contact a health care provider if:  You have new or worsening bleeding during a bowel movement.  You  have new or increased blood in your stool.  You have a change in bowel habits.  You lose weight for no known reason. Summary  Polyps are tissue growths inside the body. Polyps can grow in many places, including the colon.  Most colon polyps are noncancerous (benign), but some can become cancerous over time.  This condition is diagnosed with a colonoscopy.  Treatment for this condition involves removing any polyps that are found. Most polyps can be removed during a  colonoscopy. This information is not intended to replace advice given to you by your health care provider. Make sure you discuss any questions you have with your health care provider. Document Released: 07/12/2004 Document Revised: 01/31/2018 Document Reviewed: 01/31/2018 Elsevier Interactive Patient Education  2019 [place].  Colonoscopy Discharge Instructions  Read the instructions outlined below and refer to this sheet in the next few weeks. These discharge instructions provide you with general information on caring for yourself after you leave the hospital. Your doctor may also give you specific instructions. While your treatment has been planned according to the most current medical practices available, unavoidable complications occasionally occur. If you have any problems or questions after discharge, call Dr. Gala Romney at [phone]. ACTIVITY  You may resume your regular activity, but move at a slower pace for the next 24 hours.   Take frequent rest periods for the next 24 hours.   Walking will help get rid of the air and reduce the bloated feeling in your belly (abdomen).   No driving for 24 hours (because of the medicine (anesthesia) used during the test).    Do not sign any important legal documents or operate any machinery for 24 hours (because of the anesthesia used during the test).  NUTRITION  Drink plenty of fluids.   You may resume your normal diet as instructed by your doctor.   Begin with a light meal and progress to your normal diet. Heavy or fried foods are harder to digest and may make you feel sick to your stomach (nauseated).   Avoid alcoholic beverages for 24 hours or as instructed.  MEDICATIONS  You may resume your normal medications unless your doctor tells you otherwise.  WHAT YOU CAN EXPECT TODAY  Some feelings of bloating in the abdomen.   Passage of more gas than usual.   Spotting of blood in your stool or on the toilet paper.  IF YOU HAD POLYPS  REMOVED DURING THE COLONOSCOPY:  No aspirin products for 7 days or as instructed.   No alcohol for 7 days or as instructed.   Eat a soft diet for the next 24 hours.  FINDING OUT THE RESULTS OF YOUR TEST Not all test results are available during your visit. If your test results are not back during the visit, make an appointment with your caregiver to find out the results. Do not assume everything is normal if you have not heard from your caregiver or the medical facility. It is important for you to follow up on all of your test results.  SEEK IMMEDIATE MEDICAL ATTENTION IF:  You have more than a spotting of blood in your stool.   Your belly is swollen (abdominal distention).   You are nauseated or vomiting.   You have a temperature over 101.   You have abdominal pain or discomfort that is severe or gets worse throughout the day.   Colon polyp information provided  Formation in your rectum from radiation caused bleeding.  It was treated today.  You may need an additional treatment.  Further recommendations to follow pending review of pathology report  Office visit with [place] in 3 months

## 2018-11-05 ENCOUNTER — Encounter: Payer: Self-pay | Admitting: Internal Medicine

## 2018-11-07 ENCOUNTER — Other Ambulatory Visit: Payer: Self-pay | Admitting: Family Medicine

## 2018-11-08 ENCOUNTER — Encounter (HOSPITAL_COMMUNITY): Payer: Self-pay | Admitting: Internal Medicine

## 2018-12-06 ENCOUNTER — Other Ambulatory Visit: Payer: Self-pay | Admitting: Family Medicine

## 2019-01-20 ENCOUNTER — Other Ambulatory Visit: Payer: Self-pay | Admitting: Family Medicine

## 2019-01-20 ENCOUNTER — Other Ambulatory Visit: Payer: Self-pay | Admitting: "Endocrinology

## 2019-01-22 DIAGNOSIS — E1159 Type 2 diabetes mellitus with other circulatory complications: Secondary | ICD-10-CM | POA: Diagnosis not present

## 2019-01-23 ENCOUNTER — Ambulatory Visit: Payer: Medicare Other | Admitting: "Endocrinology

## 2019-01-23 LAB — COMPLETE METABOLIC PANEL WITH GFR
AG RATIO: 1.3 (calc) (ref 1.0–2.5)
ALBUMIN MSPROF: 3.4 g/dL — AB (ref 3.6–5.1)
ALT: 37 U/L (ref 9–46)
AST: 27 U/L (ref 10–35)
Alkaline phosphatase (APISO): 89 U/L (ref 35–144)
BUN / CREAT RATIO: 23 (calc) — AB (ref 6–22)
BUN: 32 mg/dL — ABNORMAL HIGH (ref 7–25)
CALCIUM: 9 mg/dL (ref 8.6–10.3)
CO2: 25 mmol/L (ref 20–32)
CREATININE: 1.41 mg/dL — AB (ref 0.70–1.25)
Chloride: 105 mmol/L (ref 98–110)
GFR, EST NON AFRICAN AMERICAN: 52 mL/min/{1.73_m2} — AB (ref >=60)
GFR, Est African American: 60 mL/min/{1.73_m2} (ref >=60)
GLOBULIN: 2.6 g/dL (ref 1.9–3.7)
GLUCOSE: 123 mg/dL (ref 65–139)
Potassium: 4 mmol/L (ref 3.5–5.3)
SODIUM: 142 mmol/L (ref 135–146)
Total Bilirubin: 0.4 mg/dL (ref 0.2–1.2)
Total Protein: 6 g/dL — ABNORMAL LOW (ref 6.1–8.1)

## 2019-01-23 LAB — HEMOGLOBIN A1C
Hgb A1c MFr Bld: 6.4 % of total Hgb — ABNORMAL HIGH (ref <5.7)
Mean Plasma Glucose: 137 (calc)
eAG (mmol/L): 7.6 (calc)

## 2019-01-27 ENCOUNTER — Ambulatory Visit (INDEPENDENT_AMBULATORY_CARE_PROVIDER_SITE_OTHER): Payer: Medicare Other | Admitting: "Endocrinology

## 2019-01-27 ENCOUNTER — Encounter: Payer: Self-pay | Admitting: "Endocrinology

## 2019-01-27 DIAGNOSIS — E782 Mixed hyperlipidemia: Secondary | ICD-10-CM | POA: Diagnosis not present

## 2019-01-27 DIAGNOSIS — E1159 Type 2 diabetes mellitus with other circulatory complications: Secondary | ICD-10-CM

## 2019-01-27 NOTE — Progress Notes (Signed)
Endocrinology Telephone Visit Follow up Note -During COVID -19 Pandemic    Subjective:    Patient ID: [name], male    DOB: 1951-09-04, PCP Alycia Rossetti, MD   Past Medical History:  Diagnosis Date  . Arthritis   . Cerebrovascular disease    MRI shows Right carotid inferior cavernours narrowing 75% and  50-75% stenosis of Cavernous and supraclinoi right side  . CKD (chronic kidney disease) 06/28/2014   Sees Dr Florene Glen  . Hyperlipidemia   . Hypertension   . Myocardial infarction ([place])    "mild" heart attack  . Necrosis ([place])    #2 nail   . Pneumonia    as a child  . Poor circulation of extremity   . Prostate cancer ([place]) 2017   Prostate  . Sleep apnea    uses cpap, getting a new one  . Type 2 Diabetes mellitus    Type 2   Past Surgical History:  Procedure Laterality Date  . ABOVE KNEE LEG AMPUTATION Left 2004   started out below knee and then extended to above knee due to poor healing  . AMPUTATION Right 04/28/2015   Procedure: AMPUTATION RAY, RIGHT 5TH TOE;  Surgeon: Marybelle Killings, MD;  Location: [facility];  Service: Orthopedics;  Laterality: Right;  . AMPUTATION Right 05/10/2015   Procedure: Right Below Knee Amputation;  Surgeon: Marybelle Killings, MD;  Location: [facility];  Service: Orthopedics;  Laterality: Right;  . CATARACT EXTRACTION W/PHACO  09/05/2012   Procedure: CATARACT EXTRACTION PHACO AND INTRAOCULAR LENS PLACEMENT (IOC);  Surgeon: Tonny Branch, MD;  Location: [facility];  Service: Ophthalmology;  Laterality: Left;  CDE=5.45  . CATARACT EXTRACTION W/PHACO  10/03/2012   Procedure: CATARACT EXTRACTION PHACO AND INTRAOCULAR LENS PLACEMENT (IOC);  Surgeon: Tonny Branch, MD;  Location: [facility];  Service: Ophthalmology;  Laterality: Right;  CDE: 12.31  . COLONOSCOPY  2011   Dr. Deatra Ina: colon polyps, tubular adenoma  . COLONOSCOPY N/A 11/01/2018   Procedure: COLONOSCOPY;  Surgeon: Daneil Dolin, MD;  [facility];   Service: Endoscopy;  Laterality: N/A;  8:30am  . POLYPECTOMY  11/01/2018   Procedure: POLYPECTOMY;  Surgeon: Daneil Dolin, MD;  [facility];  Service: Endoscopy;;  cecum,splenic flexure   Social History   Socioeconomic History  . Marital status: Married    Spouse name: Not on file  . Number of children: 2  . Years of education: college  . Highest education level: Not on file  Occupational History  . Occupation: Mining engineer: [employer]  Social Needs  . Financial resource strain: Not on file  . Food insecurity:    Worry: Not on file    Inability: Not on file  . Transportation needs:    Medical: Not on file    Non-medical: Not on file  Tobacco Use  . Smoking status: Never Smoker  . Smokeless tobacco: Never Used  Substance and Sexual Activity  . Alcohol use: No  . Drug use: No  . Sexual activity: Never  Lifestyle  . Physical activity:    Days per week: Not on file    Minutes per session:  Not on file  . Stress: Not on file  Relationships  . Social connections:    Talks on phone: Not on file    Gets together: Not on file    Attends religious service: Not on file    Active member of club or organization: Not on file    Attends meetings of clubs or organizations: Not on file    Relationship status: Not on file  Other Topics Concern  . Not on file  Social History Narrative   Patient lives with his wife    Patient right handed   Patient drinks caffine on occ.   Outpatient Encounter Medications as of 01/27/2019  Medication Sig  . acetaminophen (TYLENOL) 500 MG tablet Take 1,000 mg by mouth daily as needed for moderate pain.  Marland Kitchen atenolol (TENORMIN) 25 MG tablet TAKE 1 TABLET BY MOUTH EVERY DAY  . atorvastatin (LIPITOR) 80 MG tablet TAKE 1 TABLET BY MOUTH EVERY DAY  . B-D UF III MINI PEN NEEDLES 31G X 5 MM MISC USE FOUR TIMES DAILY AS DIRECTED  . Calcium Carb-Cholecalciferol (CALCIUM PLUS VITAMIN D3) 600-500 MG-UNIT CAPS Take 2 capsules by  mouth daily.   . clopidogrel (PLAVIX) 75 MG tablet TAKE 1 TABLET BY MOUTH EVERY DAY  . CONTOUR TEST test strip TEST TWICE DAILY E11.65  . furosemide (LASIX) 40 MG tablet TAKE 1 TABLET BY MOUTH EVERY DAY  . glucose blood (CONTOUR NEXT TEST) test strip Use as instructed bid. E11.65  . glucose blood test strip 1 each by Other route 2 (two) times daily. Use as instructed bid. E11.65 Contour next  . insulin degludec (TRESIBA FLEXTOUCH) 100 UNIT/ML SOPN FlexTouch Pen Inject 30 Units into the skin at bedtime.   . metFORMIN (GLUCOPHAGE) 500 MG tablet TAKE 1 TABLET BY MOUTH TWICE A DAY  . polyethylene glycol-electrolytes (TRILYTE) 420 g solution Take 4,000 mLs by mouth as directed.  . Semaglutide 0.25 or 0.5 MG/DOSE SOPN Inject 0.5 mg into the skin every Monday.    No facility-administered encounter medications on file as of 01/27/2019.    ALLERGIES: Allergies  Allergen Reactions  . Zolpidem Tartrate Other (See Comments)    disorientation    VACCINATION STATUS: Immunization History  Administered Date(s) Administered  . Influenza Split 06/30/2013, 08/30/2013  . Influenza-Unspecified 07/30/2015  . Pneumococcal Conjugate-13 01/08/2018  . Pneumococcal Polysaccharide-23 07/31/2007  . Td 10/30/2002  . Tdap 06/19/2016  . Zoster 06/19/2016    Diabetes  He presents for his follow-up diabetic visit. He has type 2 diabetes mellitus. Onset time: He was diagnosed at approximate age of 10 years. His disease course has been stable. There are no hypoglycemic associated symptoms. Pertinent negatives for hypoglycemia include no confusion, pallor or seizures. There are no diabetic associated symptoms. Pertinent negatives for diabetes include no fatigue, no polydipsia, no polyphagia, no polyuria and no weakness. There are no hypoglycemic complications. Symptoms are stable. Diabetic complications include nephropathy. (Status post bilateral lower extremity amputations. He is wheelchair-bound, awaiting for leg  prosthetics.) Risk factors for coronary artery disease include diabetes mellitus, dyslipidemia, hypertension, male sex, obesity, sedentary lifestyle and tobacco exposure. Current diabetic treatment includes insulin injections (He could not afford By[place], [place], due to a gap in his insurance, currently on the on metformin 500 minute grams by mouth twice a day.). He is compliant with treatment most of the time. He is following a diabetic diet. When asked about meal planning, he reported none. He has had a previous visit with a dietitian. He never participates  in exercise. His home blood glucose trend is decreasing steadily. His breakfast blood glucose range is generally 130-140 mg/dl. His bedtime blood glucose range is generally 130-140 mg/dl. His overall blood glucose range is 130-140 mg/dl. An ACE inhibitor/angiotensin II receptor blocker is being taken.  Hyperlipidemia  This is a chronic problem. The current episode started more than 1 year ago. The problem is controlled. Pertinent negatives include no myalgias. Current antihyperlipidemic treatment includes statins. Risk factors for coronary artery disease include dyslipidemia, diabetes mellitus, hypertension, male sex, obesity and a sedentary lifestyle.      Objective:    There were no vitals taken for this visit.  Wt Readings from Last 3 Encounters:  11/01/18 249 lb (112.9 kg)  09/23/18 248 lb (112.5 kg)  09/02/18 248 lb 12.8 oz (112.9 kg)      Results for orders placed or performed during the hospital encounter of 11/01/18  Glucose, capillary  Result Value Ref Range   Glucose-Capillary 98 70 - 99 mg/dL   Complete Blood Count (Most recent): Lab Results  Component Value Date   WBC 4.5 01/08/2018   HGB 11.3 (L) 01/08/2018   HCT 34.2 (L) 01/08/2018   MCV 89.8 01/08/2018   PLT 230 01/08/2018   Chemistry (most recent): Lab Results  Component Value Date   NA 142 01/22/2019   K 4.0 01/22/2019   CL 105 01/22/2019   CO2 25 01/22/2019    BUN 32 (H) 01/22/2019   CREATININE 1.41 (H) 01/22/2019   Diabetic Labs (most recent): Lab Results  Component Value Date   HGBA1C 6.4 (H) 01/22/2019   HGBA1C 6.4 (H) 08/27/2018   HGBA1C 6.6 (H) 04/18/2018   Lipid Panel     Component Value Date/Time   CHOL 106 01/08/2018 1024   CHOL 158 02/23/2015 0931   TRIG 93 01/08/2018 1024   TRIG 98 10/27/2013 1305   HDL 36 (L) 01/08/2018 1024   HDL 55 02/23/2015 0931   HDL 39 (L) 10/27/2013 1305   CHOLHDL 2.9 01/08/2018 1024   VLDL 14 10/31/2016 1137   LDLCALC 52 01/08/2018 1024   LDLCALC 82 10/27/2013 1305    Assessment & Plan:   1. Type 2 diabetes mellitus with other circulatory complications (HCC)  -His  diabetes is  complicated by extensive peripheral arterial disease with bilateral lower extremity amputations and patient remains at a high risk for more acute and chronic complications of diabetes which include CAD, CVA, CKD, retinopathy, and neuropathy. These are all discussed in detail with the patient.  Patient reports near target glycemic profile, A1c remains stable at 6.4%.     Recent labs reviewed, showing stage stable  3 renal insufficiency.    - Patient is advised to stick to a routine mealtimes to eat 3 meals  a day and avoid unnecessary snacks ( to snack only to correct hypoglycemia).   - I have approached patient with the following individualized plan to manage diabetes and patient agrees.  - He is advised to continue Tresiba 30 units nightly associated with monitoring of blood glucose daily before breakfast and at any other time as needed.   -He will continue to benefit from low-dose metformin, advised to continue metformin 500 mg p.o. twice daily, as well as Ozempic 0.5 mg subcutaneously weekly (samples from clinic).  His insurance did not provide coverage for Ozempic nor Trulicity.   2) Lipids/HPL: His recent lipid panel showed controlled LDL at 52.  He will be considered for lower dose of statin.  I lowered  his  atorvastatin to 40 mg nightly from 80 mg.    - I advised patient to maintain close follow up with Alycia Rossetti, MD for primary care needs. - Patient Care Time Today:  25 min, of which >50% was spent in reviewing his  current and  previous labs/studies, his blood glucose readings, previous treatments, and medications doses and developing a plan for long-term care based on the latest recommendations for standards of care.  [name] participated in the discussions, expressed understanding, and voiced agreement with the above plans.  All questions were answered to his satisfaction. he is encouraged to contact clinic should he have any questions or concerns prior to his return visit.    Follow up plan: -Return in about 4 months (around 05/29/2019) for Follow up with Pre-visit Labs, Meter, and Logs.  Glade Lloyd, MD Phone: [phone]  Fax: [phone]  -  This note was partially dictated with voice recognition software. Similar sounding words can be transcribed inadequately or may not  be corrected upon review.  01/27/2019, 11:35 AM

## 2019-02-03 ENCOUNTER — Other Ambulatory Visit: Payer: Self-pay

## 2019-02-03 ENCOUNTER — Ambulatory Visit (INDEPENDENT_AMBULATORY_CARE_PROVIDER_SITE_OTHER): Payer: Medicare Other | Admitting: Gastroenterology

## 2019-02-03 ENCOUNTER — Encounter: Payer: Self-pay | Admitting: Gastroenterology

## 2019-02-03 DIAGNOSIS — Z86010 Personal history of colonic polyps: Secondary | ICD-10-CM

## 2019-02-03 DIAGNOSIS — K627 Radiation proctitis: Secondary | ICD-10-CM

## 2019-02-03 DIAGNOSIS — Z860101 Personal history of adenomatous and serrated colon polyps: Secondary | ICD-10-CM

## 2019-02-03 NOTE — Patient Instructions (Signed)
1. Due to history of precancerous colon polyps, you will need to have another colonoscopy in January 2025. 2. You are found to have radiation proctitis (inflammation of the rectum related to your prior radiation treatments).  This was the source of your bleeding.  The condition was treated during your colonoscopy however sometimes you can have recurrent symptoms and may need additional treatments.  Please let us know if you have any further problems.

## 2019-02-03 NOTE — Progress Notes (Addendum)
REVIEWED-NO ADDITIONAL RECOMMENDATIONS.   Primary Care Physician:  Alycia Rossetti, MD Primary GI:  Barney Drain, MD   Patient Location: Home  Provider Location: [facility] office  Reason for Visit: Follow-up radiation proctitis  Persons present on the virtual encounter, with roles: Patient, myself (provider), Zara Council, LPN (updating meds and allergies)  Total time (minutes) spent on medical discussion: 7 minutes  Due to COVID-19, visit was conducted using FaceTime method.  Visit was requested by patient.  Virtual Visit via face time  I connected with [name] on 02/03/19 at  9:30 AM EDT by face time and verified that I am speaking with the correct person using two identifiers.   I discussed the limitations, risks, security and privacy concerns of performing an evaluation and management service by telephone/video and the availability of in person appointments. I also discussed with the patient that there may be a patient responsible charge related to this service. The patient expressed understanding and agreed to proceed.   HPI:   [name] is a 68 y.o. male who presents for virtual visit regarding: Rectal bleeding/radiation proctitis.  Patient was seen back in November 2019 for rectal bleeding.  Diagnosed with prostate cancer approximately a year prior, undergoing seed implants as well as external beam radiation treatments, completing in December 2018.   Patient had a colonoscopy on November 01, 2018 (performed by Dr. Gala Romney and Dr. fields absence).  He was found to have radiation proctitis, underwent APC ablation.  He had 3 polyps removed, one was not recovered.  The other 2 were tubular adenomas.  Next colonoscopy in 5 years.  Patient reports that he has been doing very well since his colonoscopy.  No further rectal bleeding.  Bowel function is good.  No abdominal pain or rectal pain.  Appetite is good.  No weight loss.  No upper GI complaints.   Current Outpatient  Medications  Medication Sig Dispense Refill  . acetaminophen (TYLENOL) 500 MG tablet Take 1,000 mg by mouth daily as needed for moderate pain.    Marland Kitchen atenolol (TENORMIN) 25 MG tablet TAKE 1 TABLET BY MOUTH EVERY DAY 90 tablet 1  . atorvastatin (LIPITOR) 80 MG tablet TAKE 1 TABLET BY MOUTH EVERY DAY 90 tablet 3  . B-D UF III MINI PEN NEEDLES 31G X 5 MM MISC USE FOUR TIMES DAILY AS DIRECTED 150 each 2  . Calcium Carb-Cholecalciferol (CALCIUM PLUS VITAMIN D3) 600-500 MG-UNIT CAPS Take 2 capsules by mouth daily.     . clopidogrel (PLAVIX) 75 MG tablet TAKE 1 TABLET BY MOUTH EVERY DAY 90 tablet 1  . CONTOUR TEST test strip TEST TWICE DAILY E11.65 200 each 2  . furosemide (LASIX) 40 MG tablet TAKE 1 TABLET BY MOUTH EVERY DAY 90 tablet 2  . glucose blood (CONTOUR NEXT TEST) test strip Use as instructed bid. E11.65 100 each 5  . glucose blood test strip 1 each by Other route 2 (two) times daily. Use as instructed bid. E11.65 Contour next 300 each 2  . insulin degludec (TRESIBA FLEXTOUCH) 100 UNIT/ML SOPN FlexTouch Pen Inject 30 Units into the skin at bedtime.     . metFORMIN (GLUCOPHAGE) 500 MG tablet TAKE 1 TABLET BY MOUTH TWICE A DAY 180 tablet 0  . Semaglutide 0.25 or 0.5 MG/DOSE SOPN Inject 0.5 mg into the skin every Monday.      No current facility-administered medications for this visit.     Past Medical History:  Diagnosis Date  . Arthritis   .  Cerebrovascular disease    MRI shows Right carotid inferior cavernours narrowing 75% and  50-75% stenosis of Cavernous and supraclinoi right side  . CKD (chronic kidney disease) 06/28/2014   Sees Dr Florene Glen  . Hyperlipidemia   . Hypertension   . Myocardial infarction ([place])    "mild" heart attack  . Necrosis ([place])    #2 nail   . Pneumonia    as a child  . Poor circulation of extremity   . Prostate cancer ([place]) 2017   Prostate  . Sleep apnea    uses cpap, getting a new one  . Type 2 Diabetes mellitus    Type 2    Past Surgical History:   Procedure Laterality Date  . ABOVE KNEE LEG AMPUTATION Left 2004   started out below knee and then extended to above knee due to poor healing  . AMPUTATION Right 04/28/2015   Procedure: AMPUTATION RAY, RIGHT 5TH TOE;  Surgeon: Marybelle Killings, MD;  Location: [facility];  Service: Orthopedics;  Laterality: Right;  . AMPUTATION Right 05/10/2015   Procedure: Right Below Knee Amputation;  Surgeon: Marybelle Killings, MD;  Location: [facility];  Service: Orthopedics;  Laterality: Right;  . CATARACT EXTRACTION W/PHACO  09/05/2012   Procedure: CATARACT EXTRACTION PHACO AND INTRAOCULAR LENS PLACEMENT (IOC);  Surgeon: Tonny Branch, MD;  [facility];  Service: Ophthalmology;  Laterality: Left;  CDE=5.45  . CATARACT EXTRACTION W/PHACO  10/03/2012   Procedure: CATARACT EXTRACTION PHACO AND INTRAOCULAR LENS PLACEMENT (IOC);  Surgeon: Tonny Branch, MD;  [facility];  Service: Ophthalmology;  Laterality: Right;  CDE: 12.31  . COLONOSCOPY  2011   Dr. Deatra Ina: colon polyps, tubular adenoma  . COLONOSCOPY N/A 11/01/2018   Dr. Gala Romney: Blood noted in the rectal vault.  Vascular pattern of the rectum abnormal, neovascular changes distally and actively oozing.  There were 3 2 to 5 mm polyps in the splenic flexure, cecum removed.  Cecal polyp was not recovered.  Radiation proctitis status post APC treatment.  The splenic flexure polyps were tubular adenomas.  Next colonoscopy in 5 years.  Marland Kitchen POLYPECTOMY  11/01/2018   Procedure: POLYPECTOMY;  Surgeon: Daneil Dolin, MD;  [facility];  Service: Endoscopy;;  cecum,splenic flexure    ROS:  General: Negative for anorexia, weight loss, fever, chills, fatigue, weakness. Eyes: Negative for vision changes.  ENT: Negative for hoarseness, difficulty swallowing , nasal congestion. CV: Negative for chest pain, angina, palpitations, dyspnea on exertion, peripheral edema.  Respiratory: Negative for dyspnea at rest, dyspnea on exertion, cough, sputum, wheezing.  GI: See history of  present illness. GU:  Negative for dysuria, hematuria, urinary incontinence, urinary frequency, nocturnal urination.  MS: Negative for joint pain, low back pain.  Derm: Negative for rash or itching.  Neuro: Negative for weakness, abnormal sensation, seizure, frequent headaches, memory loss, confusion.  Psych: Negative for anxiety, depression, suicidal ideation, hallucinations.  Endo: Negative for unusual weight change.  Heme: Negative for bruising or bleeding. Allergy: Negative for rash or hives.   Observations/Objective: Looks well.  No distress.  Otherwise physical exam not available because this was a virtual visit.  Assessment and Plan: Very pleasant 68 year old gentleman with history of radiation proctitis status post APC.  Patient is doing very well clinically.  He also had precancerous colon polyps and will need another colonoscopy in 5 years.  Return to the office as needed in the interim.  Follow Up Instructions:    I discussed the assessment and treatment plan  with the patient. The patient was provided an opportunity to ask questions and all were answered. The patient agreed with the plan and demonstrated an understanding of the instructions. AVS mailed to patient's home address.   The patient was advised to call back or seek an in-person evaluation if the symptoms worsen or if the condition fails to improve as anticipated.  I provided 7 minutes of virtual face-to-face time during this encounter.   Neil Crouch, PA-C

## 2019-02-03 NOTE — Progress Notes (Signed)
cc'ed to pcp °

## 2019-02-24 DIAGNOSIS — C61 Malignant neoplasm of prostate: Secondary | ICD-10-CM | POA: Diagnosis not present

## 2019-03-21 ENCOUNTER — Ambulatory Visit (INDEPENDENT_AMBULATORY_CARE_PROVIDER_SITE_OTHER): Payer: Medicare Other | Admitting: Urology

## 2019-03-21 ENCOUNTER — Other Ambulatory Visit (HOSPITAL_COMMUNITY)
Admission: AD | Admit: 2019-03-21 | Discharge: 2019-03-21 | Disposition: A | Discharge status: Home / Self Care | Payer: Medicare Other | Source: Other Acute Inpatient Hospital | Attending: Urology | Admitting: Urology

## 2019-03-21 DIAGNOSIS — N3941 Urge incontinence: Secondary | ICD-10-CM | POA: Insufficient documentation

## 2019-03-21 DIAGNOSIS — C775 Secondary and unspecified malignant neoplasm of intrapelvic lymph nodes: Secondary | ICD-10-CM

## 2019-03-21 DIAGNOSIS — C61 Malignant neoplasm of prostate: Secondary | ICD-10-CM

## 2019-03-21 LAB — URINALYSIS, COMPLETE (UACMP) WITH MICROSCOPIC
Bilirubin Urine: NEGATIVE
Glucose, UA: NEGATIVE mg/dL
Hgb urine dipstick: NEGATIVE
Ketones, ur: NEGATIVE mg/dL
Nitrite: NEGATIVE
Protein, ur: NEGATIVE mg/dL
Specific Gravity, Urine: 1.009 (ref 1.005–1.030)
pH: 6 (ref 5.0–8.0)

## 2019-03-24 LAB — URINE CULTURE: Culture: >=100000 — AB

## 2019-03-26 DIAGNOSIS — C61 Malignant neoplasm of prostate: Secondary | ICD-10-CM | POA: Diagnosis not present

## 2019-04-22 ENCOUNTER — Other Ambulatory Visit: Payer: Self-pay | Admitting: Family Medicine

## 2019-05-12 ENCOUNTER — Other Ambulatory Visit: Payer: Self-pay | Admitting: "Endocrinology

## 2019-05-20 DIAGNOSIS — E1159 Type 2 diabetes mellitus with other circulatory complications: Secondary | ICD-10-CM | POA: Diagnosis not present

## 2019-05-21 LAB — COMPLETE METABOLIC PANEL WITH GFR
AG Ratio: 1.3 (calc) (ref 1.0–2.5)
ALBUMIN MSPROF: 3.5 g/dL — AB (ref 3.6–5.1)
ALKALINE PHOSPHATASE (APISO): 78 U/L (ref 35–144)
ALT: 21 U/L (ref 9–46)
AST: 19 U/L (ref 10–35)
BUN/Creatinine Ratio: 21 (calc) (ref 6–22)
BUN: 28 mg/dL — ABNORMAL HIGH (ref 7–25)
CO2: 26 mmol/L (ref 20–32)
Calcium: 9.4 mg/dL (ref 8.6–10.3)
Chloride: 105 mmol/L (ref 98–110)
Creat: 1.35 mg/dL — ABNORMAL HIGH (ref 0.70–1.25)
GFR, Est African American: 63 mL/min/{1.73_m2} (ref >=60)
GFR, Est Non African American: 54 mL/min/{1.73_m2} — ABNORMAL LOW (ref >=60)
Globulin: 2.6 g/dL (calc) (ref 1.9–3.7)
Glucose, Bld: 104 mg/dL — ABNORMAL HIGH (ref 65–99)
Potassium: 4.2 mmol/L (ref 3.5–5.3)
Sodium: 143 mmol/L (ref 135–146)
Total Bilirubin: 0.5 mg/dL (ref 0.2–1.2)
Total Protein: 6.1 g/dL (ref 6.1–8.1)

## 2019-05-21 LAB — HEMOGLOBIN A1C
Hgb A1c MFr Bld: 6 % of total Hgb — ABNORMAL HIGH (ref <5.7)
MEAN PLASMA GLUCOSE: 126 (calc)
eAG (mmol/L): 7 (calc)

## 2019-05-29 ENCOUNTER — Ambulatory Visit (INDEPENDENT_AMBULATORY_CARE_PROVIDER_SITE_OTHER): Payer: Medicare Other | Admitting: "Endocrinology

## 2019-05-29 ENCOUNTER — Other Ambulatory Visit: Payer: Self-pay

## 2019-05-29 ENCOUNTER — Encounter: Payer: Self-pay | Admitting: "Endocrinology

## 2019-05-29 VITALS — BP 123/75 | HR 82 | Ht 71.5 in | Wt 241.0 lb

## 2019-05-29 DIAGNOSIS — I10 Essential (primary) hypertension: Secondary | ICD-10-CM

## 2019-05-29 DIAGNOSIS — E782 Mixed hyperlipidemia: Secondary | ICD-10-CM

## 2019-05-29 DIAGNOSIS — E1159 Type 2 diabetes mellitus with other circulatory complications: Secondary | ICD-10-CM | POA: Diagnosis not present

## 2019-05-29 NOTE — Progress Notes (Signed)
05/29/2019   Endocrinology follow-up note   Subjective:    Patient ID: [name], male    DOB: Feb 10, 1951, PCP Alycia Rossetti, MD   Past Medical History:  Diagnosis Date  . Arthritis   . Cerebrovascular disease    MRI shows Right carotid inferior cavernours narrowing 75% and  50-75% stenosis of Cavernous and supraclinoi right side  . CKD (chronic kidney disease) 06/28/2014   Sees Dr Florene Glen  . Hyperlipidemia   . Hypertension   . Myocardial infarction ([place])    "mild" heart attack  . Necrosis ([place])    #2 nail   . Pneumonia    as a child  . Poor circulation of extremity   . Prostate cancer ([place]) 2017   Prostate  . Sleep apnea    uses cpap, getting a new one  . Type 2 Diabetes mellitus    Type 2   Past Surgical History:  Procedure Laterality Date  . ABOVE KNEE LEG AMPUTATION Left 2004   started out below knee and then extended to above knee due to poor healing  . AMPUTATION Right 04/28/2015   Procedure: AMPUTATION RAY, RIGHT 5TH TOE;  Surgeon: Marybelle Killings, MD;  Location: [facility];  Service: Orthopedics;  Laterality: Right;  . AMPUTATION Right 05/10/2015   Procedure: Right Below Knee Amputation;  Surgeon: Marybelle Killings, MD;  Location: [facility];  Service: Orthopedics;  Laterality: Right;  . CATARACT EXTRACTION W/PHACO  09/05/2012   Procedure: CATARACT EXTRACTION PHACO AND INTRAOCULAR LENS PLACEMENT (IOC);  Surgeon: Tonny Branch, MD;  Location: [facility];  Service: Ophthalmology;  Laterality: Left;  CDE=5.45  . CATARACT EXTRACTION W/PHACO  10/03/2012   Procedure: CATARACT EXTRACTION PHACO AND INTRAOCULAR LENS PLACEMENT (IOC);  Surgeon: Tonny Branch, MD;  Location: [facility];  Service: Ophthalmology;  Laterality: Right;  CDE: 12.31  . COLONOSCOPY  2011   Dr. Deatra Ina: colon polyps, tubular adenoma  . COLONOSCOPY N/A 11/01/2018   Dr. Gala Romney: Blood noted in the rectal vault.  Vascular pattern of the rectum abnormal, neovascular changes distally and actively oozing.  There were 3 2 to 5 mm polyps in  the splenic flexure, cecum removed.  Cecal polyp was not recovered.  Radiation proctitis status post [facility] treatment.  The splenic flexure polyps were tubular adenomas.  Next colonoscopy in 5 years.  Marland Kitchen POLYPECTOMY  11/01/2018   Procedure: POLYPECTOMY;  Surgeon: Daneil Dolin, MD;  [facility];  Service: Endoscopy;;  cecum,splenic flexure   Social History   Socioeconomic History  . Marital status: Married    Spouse name: Not on file  . Number of children: 2  . Years of education: college  . Highest education level: Not on file  Occupational History  . Occupation: Mining engineer: [employer]  Social Needs  . Financial resource strain: Not on file  . Food insecurity    Worry: Not on file    Inability: Not on file  . Transportation needs    Medical: Not on file    Non-medical: Not on file  Tobacco Use  . Smoking status: Never Smoker  . Smokeless tobacco: Never Used  Substance and Sexual Activity  . Alcohol use: No  . Drug use: No  . Sexual activity: Never  Lifestyle  . Physical activity    Days per week: Not on file    Minutes per session: Not on file  . Stress: Not on file  Relationships  . Social connections  Talks on phone: Not on file    Gets together: Not on file    Attends religious service: Not on file    Active member of club or organization: Not on file    Attends meetings of clubs or organizations: Not on file    Relationship status: Not on file  Other Topics Concern  . Not on file  Social History Narrative   Patient lives with his wife    Patient right handed   Patient drinks caffine on occ.   Outpatient Encounter Medications as of 05/29/2019  Medication Sig  . mirabegron ER (MYRBETRIQ) 25 MG TB24 tablet Take 25 mg by mouth daily.  Marland Kitchen acetaminophen (TYLENOL) 500 MG tablet Take 1,000 mg by mouth daily as needed for moderate pain.  Marland Kitchen atenolol (TENORMIN) 25 MG tablet TAKE 1 TABLET BY MOUTH EVERY DAY  . atorvastatin (LIPITOR) 80 MG  tablet TAKE 1 TABLET BY MOUTH EVERY DAY  . B-D UF III MINI PEN NEEDLES 31G X 5 MM MISC USE FOUR TIMES DAILY AS DIRECTED  . Calcium Carb-Cholecalciferol (CALCIUM PLUS VITAMIN D3) 600-500 MG-UNIT CAPS Take 2 capsules by mouth daily.   . clopidogrel (PLAVIX) 75 MG tablet TAKE 1 TABLET BY MOUTH EVERY DAY  . CONTOUR TEST test strip TEST TWICE DAILY E11.65  . furosemide (LASIX) 40 MG tablet TAKE 1 TABLET BY MOUTH EVERY DAY  . glucose blood (CONTOUR NEXT TEST) test strip Use as instructed bid. E11.65  . glucose blood test strip 1 each by Other route 2 (two) times daily. Use as instructed bid. E11.65 Contour next  . insulin degludec (TRESIBA FLEXTOUCH) 100 UNIT/ML SOPN FlexTouch Pen Inject 20 Units into the skin at bedtime.  . metFORMIN (GLUCOPHAGE) 500 MG tablet TAKE 1 TABLET BY MOUTH TWICE A DAY  . Semaglutide 0.25 or 0.5 MG/DOSE SOPN Inject 0.5 mg into the skin every Monday.    No facility-administered encounter medications on file as of 05/29/2019.    ALLERGIES: Allergies  Allergen Reactions  . Zolpidem Tartrate Other (See Comments)    disorientation    VACCINATION STATUS: Immunization History  Administered Date(s) Administered  . Influenza Split 06/30/2013, 08/30/2013  . Influenza-Unspecified 07/30/2015  . Pneumococcal Conjugate-13 01/08/2018  . Pneumococcal Polysaccharide-23 07/31/2007  . Td 10/30/2002  . Tdap 06/19/2016  . Zoster 06/19/2016    Diabetes He presents for his follow-up diabetic visit. He has type 2 diabetes mellitus. Onset time: He was diagnosed at approximate age of 13 years. His disease course has been improving. There are no hypoglycemic associated symptoms. Pertinent negatives for hypoglycemia include no confusion, pallor or seizures. There are no diabetic associated symptoms. Pertinent negatives for diabetes include no fatigue, no polydipsia, no polyphagia, no polyuria and no weakness. There are no hypoglycemic complications. Symptoms are improving. Diabetic  complications include nephropathy. (Status post bilateral lower extremity amputations. He is wheelchair-bound, awaiting for leg prosthetics.) Risk factors for coronary artery disease include diabetes mellitus, dyslipidemia, hypertension, male sex, obesity, sedentary lifestyle and tobacco exposure. Current diabetic treatment includes insulin injections (He could not afford By[place], [place]je[place], due to a gap in his insurance, currently on the on metformin 500 minute grams by mouth twice a day.). He is compliant with treatment most of the time. He is following a diabetic diet. When asked about meal planning, he reported none. He has had a previous visit with a dietitian. He never participates in exercise. (He did not bring any logs to review today.  His previsit labs show A1c of 6%.) An  ACE inhibitor/angiotensin II receptor blocker is being taken.  Hyperlipidemia This is a chronic problem. The current episode started more than 1 year ago. The problem is controlled. Pertinent negatives include no myalgias. Current antihyperlipidemic treatment includes statins. Risk factors for coronary artery disease include dyslipidemia, diabetes mellitus, hypertension, male sex, obesity and a sedentary lifestyle.      Objective:    BP 123/75   Pulse 82   Ht 5' 11.5" (1.816 m)   Wt 241 lb (109.3 kg)   BMI 33.14 kg/m   Wt Readings from Last 3 Encounters:  05/29/19 241 lb (109.3 kg)  11/01/18 249 lb (112.9 kg)  09/23/18 248 lb (112.5 kg)      Results for orders placed or performed during the hospital encounter of 03/21/19  Culture, Urine   Specimen: Urine, Clean Catch  Result Value Ref Range   Specimen Description      URINE, CLEAN CATCH Performed at [facility], [address] [place], [place] [address]    Special Requests      NONE Performed at [facility], [address], [place], [place] [address]    Culture >=100,000 COLONIES/mL PROTEUS MIRABILIS (A)    Report Status 03/24/2019 FINAL    Organism ID,  Bacteria PROTEUS MIRABILIS (A)       Susceptibility   Proteus mirabilis - MIC*    AMPICILLIN <=2 SENSITIVE Sensitive     CEFAZOLIN <=4 SENSITIVE Sensitive     CEFTRIAXONE <=1 SENSITIVE Sensitive     CIPROFLOXACIN <=0.25 SENSITIVE Sensitive     GENTAMICIN <=1 SENSITIVE Sensitive     IMIPENEM 2 SENSITIVE Sensitive     NITROFURANTOIN 128 RESISTANT Resistant     TRIMETH/SULFA <=20 SENSITIVE Sensitive     AMPICILLIN/SULBACTAM <=2 SENSITIVE Sensitive     PIP/TAZO <=4 SENSITIVE Sensitive     * >=100,000 COLONIES/mL PROTEUS MIRABILIS  Urinalysis, Complete w Microscopic  Result Value Ref Range   Color, Urine STRAW (A) YELLOW   APPearance CLEAR CLEAR   Specific Gravity, Urine 1.009 1.005 - 1.030   pH 6.0 5.0 - 8.0   Glucose, UA NEGATIVE NEGATIVE mg/dL   Hgb urine dipstick NEGATIVE NEGATIVE   Bilirubin Urine NEGATIVE NEGATIVE   Ketones, ur NEGATIVE NEGATIVE mg/dL   Protein, ur NEGATIVE NEGATIVE mg/dL   Nitrite NEGATIVE NEGATIVE   Leukocytes,Ua LARGE (A) NEGATIVE   RBC / HPF 0-5 0 - 5 RBC/hpf   WBC, UA 21-50 0 - 5 WBC/hpf   Bacteria, UA RARE (A) NONE SEEN   Squamous Epithelial / LPF 0-5 0 - 5   Hyaline Casts, UA PRESENT    Complete Blood Count (Most recent): Lab Results  Component Value Date   WBC 4.5 01/08/2018   HGB 11.3 (L) 01/08/2018   HCT 34.2 (L) 01/08/2018   MCV 89.8 01/08/2018   PLT 230 01/08/2018   Chemistry (most recent): Lab Results  Component Value Date   NA 143 05/20/2019   K 4.2 05/20/2019   CL 105 05/20/2019   CO2 26 05/20/2019   BUN 28 (H) 05/20/2019   CREATININE 1.35 (H) 05/20/2019   Diabetic Labs (most recent): Lab Results  Component Value Date   HGBA1C 6.0 (H) 05/20/2019   HGBA1C 6.4 (H) 01/22/2019   HGBA1C 6.4 (H) 08/27/2018   Lipid Panel     Component Value Date/Time   CHOL 106 01/08/2018 1024   CHOL 158 02/23/2015 0931   TRIG 93 01/08/2018 1024   TRIG 98 10/27/2013 1305   HDL 36 (L) 01/08/2018  1024   HDL 55 02/23/2015 0931   HDL 39 (L)  10/27/2013 1305   CHOLHDL 2.9 01/08/2018 1024   VLDL 14 10/31/2016 1137   LDLCALC 52 01/08/2018 1024   LDLCALC 82 10/27/2013 1305    Assessment & Plan:   1. Type 2 diabetes mellitus with other circulatory complications (HCC)  -His  diabetes is  complicated by extensive peripheral arterial disease with bilateral lower extremity amputations and patient remains at a high risk for more acute and chronic complications of diabetes which include CAD, CVA, CKD, retinopathy, and neuropathy. These are all discussed in detail with the patient.  -He reports blood glucose readings all within target, no major hypoglycemia nor hyperglycemia.  He maintains control of diabetes to target an A1c of 6%.    Recent labs reviewed, showing stage stable  3 renal insufficiency.  - he  admits there is a room for improvement in his diet and drink choices. -  Suggestion is made for him to avoid simple carbohydrates  from his diet including Cakes, Sweet Desserts / Pastries, Ice Cream, Soda (diet and regular), Sweet Tea, Candies, Chips, Cookies, Sweet Pastries,  Store Bought Juices, Alcohol in Excess of  1-2 drinks a day, Artificial Sweeteners, Coffee Creamer, and "Sugar-free" Products. This will help patient to have stable blood glucose profile and potentially avoid unintended weight gain.   - Patient is advised to stick to a routine mealtimes to eat 3 meals  a day and avoid unnecessary snacks ( to snack only to correct hypoglycemia).   - I have approached patient with the following individualized plan to manage diabetes and patient agrees.  - He is advised to lower her Tresiba to 20 units nightly, associated with monitoring of blood glucose daily before breakfast and at any other time as needed.   -He will continue to benefit from low-dose metformin, advised to continue metformin 500 mg p.o. twice daily, as well as Ozempic 0.5 mg subcutaneously weekly (samples from clinic).  His insurance did not provide coverage for  Ozempic nor Trulicity.   2) hypertension: His blood pressure is controlled to target.   -He is advised to continue current medications including atenolol 25 mg p.o. daily, Lasix as needed.   3) Lipids/HPL: His recent lipid panel showed controlled LDL at 52.  He is advised to continue atorvastatin 80 mg nightly.  Side effects and precautions discussed with him.     - I advised patient to maintain close follow up with Alycia Rossetti, MD for primary care needs.  - Patient Care Time Today:  25 min, of which >50% was spent in  counseling and the rest reviewing his  current and  previous labs/studies, previous treatments, his blood glucose readings, and medications' doses and developing a plan for long-term care based on the latest recommendations for standards of care.   Ozella Almond Kosinski participated in the discussions, expressed understanding, and voiced agreement with the above plans.  All questions were answered to his satisfaction. he is encouraged to contact clinic should he have any questions or concerns prior to his return visit.   Follow up plan: -Return in about 6 months (around 11/29/2019) for Follow up with Pre-visit Labs, Meter, and Logs.  Glade Lloyd, MD Phone: [phone]  Fax: [phone]  -  This note was partially dictated with voice recognition software. Similar sounding words can be transcribed inadequately or may not  be corrected upon review.  05/29/2019, 4:17 PM

## 2019-06-15 ENCOUNTER — Other Ambulatory Visit: Payer: Self-pay | Admitting: Family Medicine

## 2019-07-15 DIAGNOSIS — C61 Malignant neoplasm of prostate: Secondary | ICD-10-CM | POA: Diagnosis not present

## 2019-07-15 DIAGNOSIS — I129 Hypertensive chronic kidney disease with stage 1 through stage 4 chronic kidney disease, or unspecified chronic kidney disease: Secondary | ICD-10-CM | POA: Diagnosis not present

## 2019-07-15 DIAGNOSIS — E1122 Type 2 diabetes mellitus with diabetic chronic kidney disease: Secondary | ICD-10-CM | POA: Diagnosis not present

## 2019-07-15 DIAGNOSIS — N183 Chronic kidney disease, stage 3 (moderate): Secondary | ICD-10-CM | POA: Diagnosis not present

## 2019-07-15 DIAGNOSIS — Z89511 Acquired absence of right leg below knee: Secondary | ICD-10-CM | POA: Diagnosis not present

## 2019-07-22 ENCOUNTER — Other Ambulatory Visit: Payer: Self-pay | Admitting: Family Medicine

## 2019-08-13 ENCOUNTER — Other Ambulatory Visit: Payer: Self-pay | Admitting: "Endocrinology

## 2019-08-26 ENCOUNTER — Ambulatory Visit (INDEPENDENT_AMBULATORY_CARE_PROVIDER_SITE_OTHER): Payer: Medicare Other | Admitting: Family Medicine

## 2019-08-26 ENCOUNTER — Encounter: Payer: Self-pay | Admitting: Family Medicine

## 2019-08-26 ENCOUNTER — Other Ambulatory Visit: Payer: Self-pay

## 2019-08-26 VITALS — BP 124/72 | HR 100 | Temp 98.1°F | Resp 14 | Ht 71.5 in | Wt 242.0 lb

## 2019-08-26 DIAGNOSIS — Z23 Encounter for immunization: Secondary | ICD-10-CM | POA: Diagnosis not present

## 2019-08-26 DIAGNOSIS — E782 Mixed hyperlipidemia: Secondary | ICD-10-CM | POA: Diagnosis not present

## 2019-08-26 DIAGNOSIS — E1159 Type 2 diabetes mellitus with other circulatory complications: Secondary | ICD-10-CM | POA: Diagnosis not present

## 2019-08-26 DIAGNOSIS — I10 Essential (primary) hypertension: Secondary | ICD-10-CM | POA: Diagnosis not present

## 2019-08-26 DIAGNOSIS — G4733 Obstructive sleep apnea (adult) (pediatric): Secondary | ICD-10-CM | POA: Diagnosis not present

## 2019-08-26 DIAGNOSIS — N184 Chronic kidney disease, stage 4 (severe): Secondary | ICD-10-CM

## 2019-08-26 DIAGNOSIS — M25532 Pain in left wrist: Secondary | ICD-10-CM | POA: Diagnosis not present

## 2019-08-26 NOTE — Progress Notes (Addendum)
Subjective:    Patient ID: [name], male    DOB: [birth year], 68 y.o.   MRN: [id number]  Patient presents for Follow-up (is not fasting)  Following with endocrinology- last A1C 6.5%   Tresiba  20 units at bedtime , metformin 500mg  BID , trulicity once a week   Has appt in November with eye doctor- [facility] in [place]- no recurrence still under surveillance , now on Myrbetriq from Urology     Followed by Nephrology- now taking plavix every other day     Hyperlipidemia- taking lipitor without difficulty     OSA-he needs new CPAP machine tubing and supplies, he is using CPAP machine every night    HTN- taking lasix 40mg  and atenolol   Complains of left wrist pain that is been going on for the past week.  He points to the joint itself.  Not had any swelling or redness.  He did use rubbing alcohol and took Tylenol and this helps.  Particular injury but he is using his walker  Review Of Systems:  GEN- denies fatigue, fever, weight loss,weakness, recent illness HEENT- denies eye drainage, change in vision, nasal discharge, CVS- denies chest pain, palpitations RESP- denies SOB, cough, wheeze ABD- denies N/V, change in stools, abd pain GU- denies dysuria, hematuria, dribbling, incontinence MSK- + joint pain, muscle aches, injury Neuro- denies headache, dizziness, syncope, seizure activity       Objective:    BP 124/72   Pulse 100   Temp 98.1 F (36.7 C) (Oral)   Resp 14   Ht 5' 11.5" (1.816 m)   Wt 242 lb (109.8 kg)   SpO2 98%   BMI 33.28 kg/m  GEN- NAD, alert and oriented x3 HEENT- PERRL, EOMI, non injected sclera, pink conjunctiva, MMM, oropharynx clear Neck- Supple, no thyromegaly CVS- RRR, no murmur RESP-CTAB ABD-NABS,soft,NT,ND MSK- FROM left wrist, no erythema, mild TTP radial aspect, NT along thumb, neg finkelsteins EXT- bilat LE amputee Pulses- Radial  2+        Assessment & Plan:      Problem List Items Addressed This Visit       Unprioritized   CKD (chronic kidney disease) (Chronic)   DM type 2 causing vascular disease ([place])    Diabetes is controlled he is followed by endocrinology. We will obtain his last nephrology note.      Relevant Orders   Hemoglobin A1c   Essential hypertension - Primary    Blood pressure is controlled no change in medication.      Relevant Orders   CBC with Differential/Platelet   Comprehensive metabolic panel   Mixed hyperlipidemia    Lipid panel.  On statin drug.      Relevant Orders   Lipid panel   OSA (obstructive sleep apnea)    He continues to benefit from CPAP therapy.  Continue with daily treatment. New supplies will be ordered.  Last sleep study was in 2016        Other Visit Diagnoses    Left wrist pain       Acute pain.  Differential diagnosis tendinitis versus arthritis he is using his hand and wrist a lot to help with ambulation.  Continue with topical anti-inflammatories as well as acetaminophen no swelling would not image at this time   Need for 23-polyvalent pneumococcal polysaccharide vaccine       Relevant Orders   Pneumococcal polysaccharide vaccine 23-valent greater than or equal to 2yo subcutaneous/IM (  Completed)      Note: This dictation was prepared with Dragon dictation along with smaller phrase technology. Any transcriptional errors that result from this process are unintentional.

## 2019-08-26 NOTE — Patient Instructions (Signed)
F/U 6 months for Physical  

## 2019-08-26 NOTE — Assessment & Plan Note (Signed)
Blood pressure is controlled no change in medication. 

## 2019-08-26 NOTE — Assessment & Plan Note (Signed)
Lipid panel.  On statin drug.

## 2019-08-26 NOTE — Assessment & Plan Note (Signed)
He continues to benefit from CPAP therapy.  New supplies will be ordered.  Last sleep study was in 2016

## 2019-08-26 NOTE — Assessment & Plan Note (Addendum)
Diabetes is controlled he is followed by endocrinology. We will obtain his last nephrology note.

## 2019-08-27 LAB — CBC WITH DIFFERENTIAL/PLATELET
Absolute Monocytes: 400 cells/uL (ref 200–950)
Basophils Absolute: 20 cells/uL (ref 0–200)
Basophils Relative: 0.4 %
Eosinophils Absolute: 120 cells/uL (ref 15–500)
Eosinophils Relative: 2.4 %
HCT: 32.5 % — ABNORMAL LOW (ref 38.5–50.0)
Hemoglobin: 10.7 g/dL — ABNORMAL LOW (ref 13.2–17.1)
Lymphs Abs: 1235 cells/uL (ref 850–3900)
MCH: 29.4 pg (ref 27.0–33.0)
MCHC: 32.9 g/dL (ref 32.0–36.0)
MCV: 89.3 fL (ref 80.0–100.0)
MPV: 9.4 fL (ref 7.5–12.5)
Monocytes Relative: 8 %
Neutro Abs: 3225 cells/uL (ref 1500–7800)
Neutrophils Relative %: 64.5 %
Platelets: 271 10*3/uL (ref 140–400)
RBC: 3.64 10*6/uL — ABNORMAL LOW (ref 4.20–5.80)
RDW: 15 % (ref 11.0–15.0)
Total Lymphocyte: 24.7 %
WBC: 5 10*3/uL (ref 3.8–10.8)

## 2019-08-27 LAB — LIPID PANEL
Cholesterol: 102 mg/dL (ref <200)
HDL: 35 mg/dL — ABNORMAL LOW (ref >=40)
LDL Cholesterol (Calc): 52 mg/dL (calc)
Non-HDL Cholesterol (Calc): 67 mg/dL (calc) (ref <130)
Total CHOL/HDL Ratio: 2.9 (calc) (ref <5.0)
Triglycerides: 72 mg/dL (ref <150)

## 2019-08-27 LAB — COMPREHENSIVE METABOLIC PANEL
AG Ratio: 1.3 (calc) (ref 1.0–2.5)
ALT: 33 U/L (ref 9–46)
AST: 23 U/L (ref 10–35)
Albumin: 3.3 g/dL — ABNORMAL LOW (ref 3.6–5.1)
Alkaline phosphatase (APISO): 91 U/L (ref 35–144)
BUN/Creatinine Ratio: 23 (calc) — ABNORMAL HIGH (ref 6–22)
BUN: 30 mg/dL — ABNORMAL HIGH (ref 7–25)
CO2: 21 mmol/L (ref 20–32)
Calcium: 9.1 mg/dL (ref 8.6–10.3)
Chloride: 107 mmol/L (ref 98–110)
Creat: 1.28 mg/dL — ABNORMAL HIGH (ref 0.70–1.25)
Globulin: 2.6 g/dL (calc) (ref 1.9–3.7)
Glucose, Bld: 106 mg/dL — ABNORMAL HIGH (ref 65–99)
Potassium: 4.3 mmol/L (ref 3.5–5.3)
Sodium: 143 mmol/L (ref 135–146)
Total Bilirubin: 0.4 mg/dL (ref 0.2–1.2)
Total Protein: 5.9 g/dL — ABNORMAL LOW (ref 6.1–8.1)

## 2019-08-27 LAB — HEMOGLOBIN A1C
Hgb A1c MFr Bld: 6.1 % of total Hgb — ABNORMAL HIGH (ref <5.7)
Mean Plasma Glucose: 128 (calc)
eAG (mmol/L): 7.1 (calc)

## 2019-09-12 ENCOUNTER — Telehealth: Payer: Self-pay | Admitting: *Deleted

## 2019-09-12 NOTE — Telephone Encounter (Signed)
Received call from patient.   Reports that he has not received supplies from [facility] for his CPAP.   Call placed to [facility]. Was advised that no order has been received.   Re-sent order.

## 2019-09-16 DIAGNOSIS — C61 Malignant neoplasm of prostate: Secondary | ICD-10-CM | POA: Diagnosis not present

## 2019-09-19 ENCOUNTER — Ambulatory Visit (INDEPENDENT_AMBULATORY_CARE_PROVIDER_SITE_OTHER): Payer: Medicare Other | Admitting: Urology

## 2019-09-19 DIAGNOSIS — C61 Malignant neoplasm of prostate: Secondary | ICD-10-CM

## 2019-09-23 ENCOUNTER — Encounter: Payer: Self-pay | Admitting: Gastroenterology

## 2019-09-23 ENCOUNTER — Telehealth: Payer: Self-pay | Admitting: Gastroenterology

## 2019-09-23 NOTE — Telephone Encounter (Signed)
TRIAGE FOR FLEX SIG WITH MOD SEDATION, Dx RADIATION PROCTITIS ASAP.

## 2019-09-23 NOTE — Telephone Encounter (Signed)
-----   Message from Daneil Dolin, MD sent at 09/23/2019 12:48 PM EST ----- [facility] patient  Dr. Gearlean Alf me recently.  Saw Mr. Vaughan in the office.  Still having some rectal bleeding.-Apparently mentioned in passing.  Did not sound like a a major problem. Probably needs a follow-up appointment with [facility] sometime between now and in January/February 21.

## 2019-09-23 NOTE — Telephone Encounter (Signed)
Routing to triage nurse.

## 2019-09-29 ENCOUNTER — Encounter: Payer: Self-pay | Admitting: "Endocrinology

## 2019-09-29 DIAGNOSIS — H524 Presbyopia: Secondary | ICD-10-CM | POA: Diagnosis not present

## 2019-09-29 DIAGNOSIS — E083593 Diabetes mellitus due to underlying condition with proliferative diabetic retinopathy without macular edema, bilateral: Secondary | ICD-10-CM | POA: Diagnosis not present

## 2019-09-29 LAB — HM DIABETES EYE EXAM

## 2019-09-30 ENCOUNTER — Other Ambulatory Visit: Payer: Self-pay

## 2019-09-30 ENCOUNTER — Ambulatory Visit (INDEPENDENT_AMBULATORY_CARE_PROVIDER_SITE_OTHER): Payer: Medicare Other | Admitting: *Deleted

## 2019-09-30 DIAGNOSIS — K627 Radiation proctitis: Secondary | ICD-10-CM

## 2019-09-30 NOTE — Telephone Encounter (Signed)
PUT PT ON A CANCELLATION LIST OR ADD AT LUNCH.

## 2019-09-30 NOTE — Patient Instructions (Addendum)
Patient Name:  [name]  Date of Procedure:  10/27/2019  Time to arrive at [place] Stay:  2:15  Please have someone available to drive you home. The hospital will cancel this appointment if you do not have a driver.  You will need to purchase 3 Fleet enemas  10/26/2019- Sunday                       :  Take one Fleet enema that evening prior to your procedure. You cannot have anything to eat or drink after midnight tonight. If you take medications, you may take them with a small amount of water.      12 /28/2020- Monday                    :  Take 2 Fleet enemas the morning of the procedure before going to the hospital. The first you will take 90 minutes before time to be at the hospital. The second you will take one hour before time to be at the hospital.   Please note, on the day of your procedure you MUST be accompanied by an adult who is willing to assume responsibility for you at time of discharge. If you do not have such person with you, your procedure will have to be rescheduled.                                                                                                                      Please leave ALL jewelry at home prior to coming to the hospital for your procedure.   If you have any questions, please call us at [phone].   Sincerely,    Christ Kick, RMA

## 2019-09-30 NOTE — Telephone Encounter (Signed)
Dr. Oneida Alar, would you like do a 7:30 case this Friday for this pt?

## 2019-09-30 NOTE — Progress Notes (Addendum)
Gastroenterology Pre-Procedure Review  Request Date: 09/30/2019 Requesting Physician: Dr. [name], [facility] requested procedure ASAP, radiation proctitis, see telephone note 09/23/2019  PATIENT REVIEW QUESTIONS: The patient responded to the following health history questions as indicated:    1. Diabetes Melitis: yes 2. Joint replacements in the past 12 months: no 3. Major health problems in the past 3 months: no 4. Has an artificial valve or MVP: no 5. Has a defibrillator: no 6. Has been advised in past to take antibiotics in advance of a procedure like teeth cleaning: no 7. Family history of colon cancer: no  8. Alcohol Use: no 9. Illicit drug Use: no 10. History of sleep apnea: yes, CPAP  11. History of coronary artery or other vascular stents placed within the last 12 months: no 12. History of any prior anesthesia complications: no 13. There is no height or weight on file to calculate BMI.ht: 6'0 wt: 241 lbs    MEDICATIONS & ALLERGIES:    Patient reports the following regarding taking any blood thinners:   Plavix? yes Aspirin? no Coumadin? no Brilinta? no Xarelto? no Eliquis? no Pradaxa? no Savaysa? no Effient? no  Patient confirms/reports the following medications:  Current Outpatient Medications  Medication Sig Dispense Refill  . acetaminophen (TYLENOL) 500 MG tablet Take 1,000 mg by mouth as needed for moderate pain.     Marland Kitchen atenolol (TENORMIN) 25 MG tablet TAKE 1 TABLET BY MOUTH EVERY DAY 90 tablet 1  . atorvastatin (LIPITOR) 80 MG tablet TAKE 1 TABLET BY MOUTH EVERY DAY 90 tablet 3  . B-D UF III MINI PEN NEEDLES 31G X 5 MM MISC USE FOUR TIMES DAILY AS DIRECTED 150 each 2  . Calcium Carb-Cholecalciferol (CALCIUM PLUS VITAMIN D3) 600-500 MG-UNIT CAPS Take 2 capsules by mouth daily.     . clopidogrel (PLAVIX) 75 MG tablet TAKE 1 TABLET BY MOUTH EVERY DAY 90 tablet 1  . CONTOUR TEST test strip TEST TWICE DAILY E11.65 200 each 2  . furosemide (LASIX) 40 MG tablet TAKE 1 TABLET BY  MOUTH EVERY DAY (Patient taking differently: Every other day) 90 tablet 0  . glucose blood (CONTOUR NEXT TEST) test strip Use as instructed bid. E11.65 100 each 5  . glucose blood test strip 1 each by Other route 2 (two) times daily. Use as instructed bid. E11.65 Contour next 300 each 2  . insulin degludec (TRESIBA FLEXTOUCH) 100 UNIT/ML SOPN FlexTouch Pen Inject 20 Units into the skin at bedtime.    . metFORMIN (GLUCOPHAGE) 500 MG tablet TAKE 1 TABLET BY MOUTH TWICE A DAY 180 tablet 0  . mirabegron ER (MYRBETRIQ) 25 MG TB24 tablet Take 25 mg by mouth daily.    . Semaglutide 0.25 or 0.5 MG/DOSE SOPN Inject 0.5 mg into the skin every Monday.      No current facility-administered medications for this visit.     Patient confirms/reports the following allergies:  Allergies  Allergen Reactions  . Zolpidem Tartrate Other (See Comments)    disorientation     No orders of the defined types were placed in this encounter.   AUTHORIZATION INFORMATION Primary Insurance: Medicare,  ID #: [id number] Pre-Cert / Auth required: No, not required  Secondary Insurance: [place],  [place] #: [id number] Pre-Cert / Josem Kaufmann required: No, not required  SCHEDULE INFORMATION: Procedure has been scheduled as follows:  Date: 10/27/2019, Time: 3:15 Location: [facility] with Dr. Oneida Alar  This Gastroenterology Pre-Precedure Review Form is being routed to the following provider(s): Neil Crouch, PA

## 2019-10-01 NOTE — Telephone Encounter (Addendum)
Spoke with Threasa Beards and added pt on for 10/27/2019.  Called pt and he is aware that I will mail him out prep instructions.

## 2019-10-01 NOTE — Addendum Note (Signed)
Addended by: Metro Kung on: 10/01/2019 07:38 AM   Modules accepted: Orders, SmartSet

## 2019-10-01 NOTE — Progress Notes (Signed)
[place] for flex sig with mod sedation for radiation proctitis.   Day before procedure: tresiba 1/2 dose night before flex sig. Metformin 1/2 dose evening before and hold metformin am of flex sig

## 2019-10-02 ENCOUNTER — Encounter: Payer: Self-pay | Admitting: *Deleted

## 2019-10-02 NOTE — Progress Notes (Signed)
Mailed letter to pt with diabetes medication adjustments.   

## 2019-10-21 ENCOUNTER — Telehealth: Payer: Self-pay

## 2019-10-21 NOTE — Telephone Encounter (Signed)
Called pt, Flex Sig for 10/27/19 moved up to 9:30am. Advised pt to arrive at 8:30am and NPO after midnight prior to test. LMOVM to inform endo scheduler.

## 2019-10-25 ENCOUNTER — Other Ambulatory Visit: Payer: Self-pay

## 2019-10-25 ENCOUNTER — Other Ambulatory Visit (HOSPITAL_COMMUNITY)
Admission: RE | Admit: 2019-10-25 | Discharge: 2019-10-25 | Disposition: A | Discharge status: Home / Self Care | Payer: Medicare Other | Source: Ambulatory Visit | Attending: Gastroenterology | Admitting: Gastroenterology

## 2019-10-25 DIAGNOSIS — N189 Chronic kidney disease, unspecified: Secondary | ICD-10-CM | POA: Insufficient documentation

## 2019-10-25 DIAGNOSIS — E1122 Type 2 diabetes mellitus with diabetic chronic kidney disease: Secondary | ICD-10-CM | POA: Diagnosis not present

## 2019-10-25 DIAGNOSIS — Z20828 Contact with and (suspected) exposure to other viral communicable diseases: Secondary | ICD-10-CM | POA: Diagnosis not present

## 2019-10-25 DIAGNOSIS — Z01818 Encounter for other preprocedural examination: Secondary | ICD-10-CM | POA: Diagnosis present

## 2019-10-25 DIAGNOSIS — K627 Radiation proctitis: Secondary | ICD-10-CM | POA: Diagnosis not present

## 2019-10-25 DIAGNOSIS — I252 Old myocardial infarction: Secondary | ICD-10-CM | POA: Diagnosis not present

## 2019-10-25 DIAGNOSIS — I129 Hypertensive chronic kidney disease with stage 1 through stage 4 chronic kidney disease, or unspecified chronic kidney disease: Secondary | ICD-10-CM | POA: Insufficient documentation

## 2019-10-25 DIAGNOSIS — G4730 Sleep apnea, unspecified: Secondary | ICD-10-CM | POA: Diagnosis not present

## 2019-10-25 DIAGNOSIS — Z794 Long term (current) use of insulin: Secondary | ICD-10-CM | POA: Insufficient documentation

## 2019-10-25 DIAGNOSIS — Z79899 Other long term (current) drug therapy: Secondary | ICD-10-CM | POA: Diagnosis not present

## 2019-10-25 LAB — SARS CORONAVIRUS 2 (TAT 6-24 HRS): SARS Coronavirus 2: NEGATIVE

## 2019-10-27 ENCOUNTER — Encounter (HOSPITAL_COMMUNITY): Payer: Self-pay | Admitting: Gastroenterology

## 2019-10-27 ENCOUNTER — Other Ambulatory Visit: Payer: Self-pay

## 2019-10-27 ENCOUNTER — Ambulatory Visit (HOSPITAL_COMMUNITY)
Admission: RE | Admit: 2019-10-27 | Discharge: 2019-10-27 | Disposition: A | Discharge status: Home / Self Care | Payer: Medicare Other | Attending: Gastroenterology | Admitting: Gastroenterology

## 2019-10-27 ENCOUNTER — Encounter (HOSPITAL_COMMUNITY)
Admission: RE | Disposition: A | Discharge status: Home / Self Care | Payer: Self-pay | Source: Home / Self Care | Attending: Gastroenterology

## 2019-10-27 DIAGNOSIS — Z89511 Acquired absence of right leg below knee: Secondary | ICD-10-CM | POA: Diagnosis not present

## 2019-10-27 DIAGNOSIS — K921 Melena: Secondary | ICD-10-CM | POA: Diagnosis present

## 2019-10-27 DIAGNOSIS — M1990 Unspecified osteoarthritis, unspecified site: Secondary | ICD-10-CM | POA: Diagnosis not present

## 2019-10-27 DIAGNOSIS — K627 Radiation proctitis: Secondary | ICD-10-CM

## 2019-10-27 DIAGNOSIS — Z8249 Family history of ischemic heart disease and other diseases of the circulatory system: Secondary | ICD-10-CM | POA: Diagnosis not present

## 2019-10-27 DIAGNOSIS — E1122 Type 2 diabetes mellitus with diabetic chronic kidney disease: Secondary | ICD-10-CM | POA: Insufficient documentation

## 2019-10-27 DIAGNOSIS — I252 Old myocardial infarction: Secondary | ICD-10-CM | POA: Insufficient documentation

## 2019-10-27 DIAGNOSIS — G4730 Sleep apnea, unspecified: Secondary | ICD-10-CM | POA: Insufficient documentation

## 2019-10-27 DIAGNOSIS — Z7902 Long term (current) use of antithrombotics/antiplatelets: Secondary | ICD-10-CM | POA: Insufficient documentation

## 2019-10-27 DIAGNOSIS — N189 Chronic kidney disease, unspecified: Secondary | ICD-10-CM | POA: Diagnosis not present

## 2019-10-27 DIAGNOSIS — E785 Hyperlipidemia, unspecified: Secondary | ICD-10-CM | POA: Insufficient documentation

## 2019-10-27 DIAGNOSIS — Z794 Long term (current) use of insulin: Secondary | ICD-10-CM | POA: Diagnosis not present

## 2019-10-27 DIAGNOSIS — Z86010 Personal history of colonic polyps: Secondary | ICD-10-CM | POA: Insufficient documentation

## 2019-10-27 DIAGNOSIS — Y842 Radiological procedure and radiotherapy as the cause of abnormal reaction of the patient, or of later complication, without mention of misadventure at the time of the procedure: Secondary | ICD-10-CM | POA: Insufficient documentation

## 2019-10-27 DIAGNOSIS — I129 Hypertensive chronic kidney disease with stage 1 through stage 4 chronic kidney disease, or unspecified chronic kidney disease: Secondary | ICD-10-CM | POA: Diagnosis not present

## 2019-10-27 DIAGNOSIS — Z79899 Other long term (current) drug therapy: Secondary | ICD-10-CM | POA: Insufficient documentation

## 2019-10-27 DIAGNOSIS — Z833 Family history of diabetes mellitus: Secondary | ICD-10-CM | POA: Diagnosis not present

## 2019-10-27 DIAGNOSIS — C61 Malignant neoplasm of prostate: Secondary | ICD-10-CM | POA: Insufficient documentation

## 2019-10-27 HISTORY — PX: FLEXIBLE SIGMOIDOSCOPY: SHX5431

## 2019-10-27 LAB — GLUCOSE, CAPILLARY: Glucose-Capillary: 124 mg/dL — ABNORMAL HIGH (ref 70–99)

## 2019-10-27 SURGERY — SIGMOIDOSCOPY, FLEXIBLE
Anesthesia: Moderate Sedation

## 2019-10-27 MED ORDER — MIDAZOLAM HCL 5 MG/5ML IJ SOLN
INTRAMUSCULAR | Status: DC | PRN
  Administered 2019-10-27 (×2): 2 mg via INTRAVENOUS

## 2019-10-27 MED ORDER — MIDAZOLAM HCL 5 MG/5ML IJ SOLN
INTRAMUSCULAR | Status: AC
  Filled 2019-10-27: qty 10

## 2019-10-27 MED ORDER — MEPERIDINE HCL 100 MG/ML IJ SOLN
INTRAMUSCULAR | Status: AC
  Filled 2019-10-27: qty 2

## 2019-10-27 MED ORDER — SODIUM CHLORIDE 0.9 % IV SOLN: INTRAVENOUS | Status: DC

## 2019-10-27 MED ORDER — STERILE WATER FOR IRRIGATION IR SOLN
Status: DC | PRN
  Administered 2019-10-27: 1.5 mL

## 2019-10-27 MED ORDER — MEPERIDINE HCL 100 MG/ML IJ SOLN
INTRAMUSCULAR | Status: DC | PRN
  Administered 2019-10-27 (×2): 25 mg via INTRAVENOUS

## 2019-10-27 NOTE — Op Note (Addendum)
[facility] Patient Name: [name] Procedure Date: 10/27/2019 9:39 AM MRN: [id number] Date of Birth: 08-Jan-1951 Attending MD: Barney Drain MD, MD CSN: [id number] Age: 68 Admit Type: Outpatient Procedure:                Flexible Sigmoidoscopy WITH CONTROL BLEEDING Indications:              Hematochezia Providers:                Barney Drain MD, MD, Charlsie Quest. Theda Sers RN, RN,                            Nelma Rothman, Technician Referring MD:             Modena Nunnery. [place] Medicines:                Meperidine 50 mg IV, Midazolam 4 mg IV Complications:            No immediate complications. Estimated Blood Loss:     Estimated blood loss was minimal. Procedure:                Pre-Anesthesia Assessment:                           - Prior to the procedure, a History and Physical                            was performed, and patient medications and                            allergies were reviewed. The patient's tolerance of                            previous anesthesia was also reviewed. The risks                            and benefits of the procedure and the sedation                            options and risks were discussed with the patient.                            All questions were answered, and informed consent                            was obtained. Prior Anticoagulants: The patient has                            taken Plavix (clopidogrel), last dose was 1 day                            prior to procedure. ASA Grade Assessment: II - A                            patient with mild systemic disease. After reviewing  the risks and benefits, the patient was deemed in                            satisfactory condition to undergo the procedure.                            After obtaining informed consent, the scope was                            passed under direct vision. The GIF-H190 ([id number])                            scope was introduced through the anus  and advanced                            to the the sigmoid colon. The flexible                            sigmoidoscopy was somewhat difficult due to                            excessive bleeding. Successful completion of the                            procedure was aided by controlling the bleeding.                            The quality of the bowel preparation was good. The                            patient tolerated the procedure well. Scope In: 9:58:40 AM Scope Out: 10:16:19 AM Total Procedure Duration: 0 hours 17 minutes 39 seconds  Findings:      Red blood was found in the distal rectum. Lavage of the area was       performed using 50 - 200 mL of normal saline, resulting in clearance       with excellent visualization. Coagulation for hemostasis using argon       plasma at 0.5 liters/minute and 20 watts was successful.      The recto-sigmoid colon and sigmoid colon appeared normal. Impression:               - RECTAL BLEEDING DUE TO RADIATION PROCTITIS. Moderate Sedation:      Moderate (conscious) sedation was administered by the endoscopy nurse       and supervised by the endoscopist. The following parameters were       monitored: oxygen saturation, heart rate, blood pressure, and response       to care. Total physician intraservice time was 27 minutes. Recommendation:           - Patient has a contact number available for                            emergencies. The signs and symptoms of potential  delayed complications were discussed with the                            patient. Return to normal activities tomorrow.                            Written discharge instructions were provided to the                            patient.                           - High fiber diet.                           - Continue present medications. RE-START PLAVIX                            TODAY.                           - Return to GI office in 6 months. Procedure  Code(s):        --- Professional ---                           [phone], Sigmoidoscopy, flexible; with control of                            bleeding, any method                           99153, Moderate sedation; each additional 15                            minutes intraservice time                           G0500, Moderate sedation services provided by the                            same physician or other qualified health care                            professional performing a gastrointestinal                            endoscopic service that sedation supports,                            requiring the presence of an independent trained                            observer to assist in the monitoring of the                            patient's level of consciousness and physiological  status; initial 15 minutes of intra-service time;                            patient age 77 years or older (additional time may                            be reported with [phone], as appropriate) Diagnosis Code(s):        --- Professional ---                           K92.1, Melena (includes Hematochezia) CPT copyright 2019 American Medical Association. All rights reserved. The codes documented in this report are preliminary and upon coder review may  be revised to meet current compliance requirements. Barney Drain, MD Barney Drain MD, MD 10/27/2019 10:32:55 AM This report has been signed electronically. Number of Addenda: 0

## 2019-10-27 NOTE — H&P (Signed)
Primary Care Physician:  Alycia Rossetti, MD Primary Gastroenterologist:  Dr. Oneida Alar  Pre-Procedure History & Physical: HPI:  [name] is a 68 y.o. male here for  BRBPR.   Past Medical History:  Diagnosis Date  . Arthritis   . Cerebrovascular disease    MRI shows Right carotid inferior cavernours narrowing 75% and  50-75% stenosis of Cavernous and supraclinoi right side  . CKD (chronic kidney disease) 06/28/2014   Sees Dr Florene Glen  . Hyperlipidemia   . Hypertension   . Myocardial infarction ([place])    "mild" heart attack  . Necrosis ([place])    #2 nail   . Pneumonia    as a child  . Poor circulation of extremity   . Prostate cancer ([place]) 2017   Prostate  . Sleep apnea    uses cpap, getting a new one  . Type 2 Diabetes mellitus    Type 2    Past Surgical History:  Procedure Laterality Date  . ABOVE KNEE LEG AMPUTATION Left 2004   started out below knee and then extended to above knee due to poor healing  . AMPUTATION Right 04/28/2015   Procedure: AMPUTATION RAY, RIGHT 5TH TOE;  Surgeon: Marybelle Killings, MD;  Location: [place];  Service: Orthopedics;  Laterality: Right;  . AMPUTATION Right 05/10/2015   Procedure: Right Below Knee Amputation;  Surgeon: Marybelle Killings, MD;  Location: [place];  Service: Orthopedics;  Laterality: Right;  . CATARACT EXTRACTION W/PHACO  09/05/2012   Procedure: CATARACT EXTRACTION PHACO AND INTRAOCULAR LENS PLACEMENT (IOC);  Surgeon: Tonny Branch, MD;  Location: [facility];  Service: Ophthalmology;  Laterality: Left;  CDE=5.45  . CATARACT EXTRACTION W/PHACO  10/03/2012   Procedure: CATARACT EXTRACTION PHACO AND INTRAOCULAR LENS PLACEMENT (IOC);  Surgeon: Tonny Branch, MD;  Location: [facility];  Service: Ophthalmology;  Laterality: Right;  CDE: 12.31  . COLONOSCOPY  2011   Dr. Deatra Ina: colon polyps, tubular adenoma  . COLONOSCOPY N/A 11/01/2018   Dr. Gala Romney: Blood noted in the rectal vault.  Vascular pattern of the rectum abnormal, neovascular changes distally and actively  oozing.  There were 3 2 to 5 mm polyps in the splenic flexure, cecum removed.  Cecal polyp was not recovered.  Radiation proctitis status post APC treatment.  The splenic flexure polyps were tubular adenomas.  Next colonoscopy in 5 years.  Marland Kitchen POLYPECTOMY  11/01/2018   Procedure: POLYPECTOMY;  Surgeon: Daneil Dolin, MD;  Location[facility];  Service: Endoscopy;;  cecum,splenic flexure    Prior to Admission medications   Medication Sig Start Date End Date Taking? Authorizing Provider  acetaminophen (TYLENOL) 325 MG tablet Take 600 mg by mouth daily as needed for mild pain, moderate pain or headache.    Yes [provider]  atenolol (TENORMIN) 25 MG tablet TAKE 1 TABLET BY MOUTH EVERY DAY Patient taking differently: Take 25 mg by mouth daily.  07/22/19  Yes [place], Modena Nunnery, MD  atorvastatin (LIPITOR) 80 MG tablet TAKE 1 TABLET BY MOUTH EVERY DAY Patient taking differently: Take 80 mg by mouth daily.  11/07/18  Yes [place], Modena Nunnery, MD  Calcium Carb-Cholecalciferol (CALCIUM PLUS VITAMIN D3) 600-500 MG-UNIT CAPS Take 2 capsules by mouth daily.    Yes [provider]  clopidogrel (PLAVIX) 75 MG tablet TAKE 1 TABLET BY MOUTH EVERY DAY Patient taking differently: Take 75 mg by mouth daily.  06/16/19  Yes [place], Modena Nunnery, MD  furosemide (LASIX) 40 MG tablet TAKE 1 TABLET BY  MOUTH EVERY DAY Patient taking differently: Take 40 mg by mouth daily.  04/22/19  Yes [place], Modena Nunnery, MD  insulin degludec (TRESIBA FLEXTOUCH) 100 UNIT/ML SOPN FlexTouch Pen Inject 20 Units into the skin at bedtime. 09/18/17  Yes [provider]  metFORMIN (GLUCOPHAGE) 500 MG tablet TAKE 1 TABLET BY MOUTH TWICE A DAY Patient taking differently: Take 500 mg by mouth 2 (two) times daily with a meal.  08/13/19  Yes Nida, Marella Chimes, MD  mirabegron ER (MYRBETRIQ) 25 MG TB24 tablet Take 25 mg by mouth daily.   Yes [provider]  Semaglutide 0.25 or 0.5 MG/DOSE SOPN Inject 0.5 mg into the  skin every Monday.    Yes [provider]  B-D UF III MINI PEN NEEDLES 31G X 5 MM MISC USE FOUR TIMES DAILY AS DIRECTED 09/16/18   Cassandria Anger, MD  CONTOUR TEST test strip TEST TWICE DAILY E11.65 06/18/18   Cassandria Anger, MD  glucose blood (CONTOUR NEXT TEST) test strip Use as instructed bid. E11.65 06/25/18   Cassandria Anger, MD  glucose blood test strip 1 each by Other route 2 (two) times daily. Use as instructed bid. E11.65 Contour next 06/12/18   Cassandria Anger, MD    Allergies as of 10/01/2019 - Review Complete 09/30/2019  Allergen Reaction Noted  . Zolpidem tartrate Other (See Comments) 04/11/2011    Family History  Problem Relation Age of Onset  . Diabetes Mother   . Hypertension Mother   . Cancer Father        lung  . Cancer Sister        breast  . Sickle cell anemia Daughter   . Cancer Maternal Grandfather        prostate  . Colon cancer Neg Hx     Social History   Socioeconomic History  . Marital status: Married    Spouse name: Not on file  . Number of children: 2  . Years of education: college  . Highest education level: Not on file  Occupational History  . Occupation: Mining engineer: [employer]  Tobacco Use  . Smoking status: Never Smoker  . Smokeless tobacco: Never Used  Substance and Sexual Activity  . Alcohol use: No  . Drug use: No  . Sexual activity: Never  Other Topics Concern  . Not on file  Social History Narrative   Patient lives with his wife    Patient right handed   Patient drinks caffine on occ.   Social Determinants of Health   Financial Resource Strain:   . Difficulty of Paying Living Expenses: Not on file  Food Insecurity:   . Worried About Charity fundraiser in the Last Year: Not on file  . Ran Out of Food in the Last Year: Not on file  Transportation Needs:   . Lack of Transportation (Medical): Not on file  . Lack of Transportation (Non-Medical): Not on file  Physical  Activity:   . Days of Exercise per Week: Not on file  . Minutes of Exercise per Session: Not on file  Stress:   . Feeling of Stress : Not on file  Social Connections:   . Frequency of Communication with Friends and Family: Not on file  . Frequency of Social Gatherings with Friends and Family: Not on file  . Attends Religious Services: Not on file  . Active Member of Clubs or Organizations: Not on file  . Attends Archivist  Meetings: Not on file  . Marital Status: Not on file  Intimate Partner Violence:   . Fear of Current or Ex-Partner: Not on file  . Emotionally Abused: Not on file  . Physically Abused: Not on file  . Sexually Abused: Not on file    Review of Systems: See HPI, otherwise negative ROS   Physical Exam: BP 106/78   Pulse 87   Temp 97.8 F (36.6 C) (Oral)   Resp 16   Ht 6' (1.829 m)   SpO2 100%   BMI 32.82 kg/m  General:   Alert,  pleasant and cooperative in NAD Head:  Normocephalic and atraumatic. Neck:  Supple; Lungs:  Clear throughout to auscultation.    Heart:  Regular rate and rhythm. Abdomen:  Soft, nontender and nondistended. Normal bowel sounds, without guarding, and without rebound.   Neurologic:  Alert and  oriented x4;  grossly normal neurologically.  Impression/Plan:     BRBPR  PLAN: FLEXSIG TODAY.  DISCUSSED PROCEDURE, BENEFITS, & RISKS: < 1% chance of medication reaction, bleeding, perforation, or ASPIRATION.

## 2019-10-27 NOTE — Discharge Instructions (Signed)
You HAVE RADIATION PROCTITIS. IT WAS ACTIVELY BLEEDING TODAY. I CAUTERIZED THE LOCATION AND IT IS NOT BLEEDING ANYMORE.   FOLLOW A HIGH FIBER  DIET. AVOID ITEMS THAT CAUSE BLOATING. SEE INFO BELOW.  FOLLOW UP IN 6 MOS.   PLEASE CALL WITH QUESTIONS OR CONCERNS.    ENDOSCOPY Care After Read the instructions outlined below and refer to this sheet in the next week. These discharge instructions provide you with general information on caring for yourself after you leave the hospital. While your treatment has been planned according to the most current medical practices available, unavoidable complications occasionally occur. If you have any problems or questions after discharge, call DR. Peggye Poon, [phone].  ACTIVITY  You may resume your regular activity, but move at a slower pace for the next 24 hours.   Take frequent rest periods for the next 24 hours.   Walking will help get rid of the air and reduce the bloated feeling in your belly (abdomen).   No driving for 24 hours (because of the medicine (anesthesia) used during the test).   You may shower.   Do not sign any important legal documents or operate any machinery for 24 hours (because of the anesthesia used during the test).    NUTRITION  Drink plenty of fluids.   You may resume your normal diet as instructed by your doctor.   Begin with a light meal and progress to your normal diet. Heavy or fried foods are harder to digest and may make you feel sick to your stomach (nauseated).   Avoid alcoholic beverages for 24 hours or as instructed.    MEDICATIONS  You may resume your normal medications.   WHAT YOU CAN EXPECT TODAY  Some feelings of bloating in the abdomen.   Passage of more gas than usual.   Spotting of blood in your stool or on the toilet paper  .  IF YOU HAD POLYPS REMOVED DURING THE ENDOSCOPY:  Eat a soft diet IF YOU HAVE NAUSEA, BLOATING, ABDOMINAL PAIN, OR VOMITING.    FINDING OUT THE RESULTS  OF YOUR TEST Not all test results are available during your visit. DR. Oneida Alar WILL CALL YOU WITHIN 14 DAYS OF YOUR PROCEDUE WITH YOUR RESULTS. Do not assume everything is normal if you have not heard from DR. Kofi Murrell, CALL HER OFFICE AT [phone].  SEEK IMMEDIATE MEDICAL ATTENTION AND CALL THE OFFICE: [phone] IF:  You have more than a spotting of blood in your stool.   Your belly is swollen (abdominal distention).   You are nauseated or vomiting.   You have a temperature over 101F.    High-Fiber Diet A high-fiber diet changes your normal diet to include more whole grains, legumes, fruits, and vegetables. Changes in the diet involve replacing refined carbohydrates with unrefined foods. The calorie level of the diet is essentially unchanged. The Dietary Reference Intake (recommended amount) for adult males is 38 grams per day. For adult females, it is 25 grams per day. Pregnant and lactating women should consume 28 grams of fiber per day. Fiber is the intact part of a plant that is not broken down during digestion. Functional fiber is fiber that has been isolated from the plant to provide a beneficial effect in the body.  PURPOSE  Increase stool bulk.   Ease and regulate bowel movements.   Lower cholesterol.   REDUCE RISK OF COLON CANCER  INDICATIONS THAT YOU NEED MORE FIBER  Constipation and hemorrhoids.   Uncomplicated diverticulosis (intestine condition) and irritable  bowel syndrome.   Weight management.   As a protective measure against hardening of the arteries (atherosclerosis), diabetes, and cancer.   GUIDELINES FOR INCREASING FIBER IN THE DIET  Start adding fiber to the diet slowly. A gradual increase of about 5 more grams (2 servings of most fruits or vegetables) per day is best. Too rapid an increase in fiber may result in constipation, flatulence, and bloating.   Drink enough water and fluids to keep your urine clear or pale yellow. Water, juice, or  caffeine-free drinks are recommended. Not drinking enough fluid may cause constipation.   Eat a variety of high-fiber foods rather than one type of fiber.   Try to increase your intake of fiber through using high-fiber foods rather than fiber pills or supplements that contain small amounts of fiber.   The goal is to change the types of food eaten. Do not supplement your present diet with high-fiber foods, but replace foods in your present diet.     RADIATION PROCTITIS RADIATION Proctitis is BLEEDING CAUSED BY PRIOR RADIATION TO THE PROSTATE. The rectum is a muscular tube that's connected to the end of your colon. Stool passes through the rectum on its way out of the body. Proctitis can cause rectal pain and the continuous sensation that you need to have a bowel movement. Proctitis symptoms can be short-lived, or they can become chronic.  Proctitis signs and symptoms may include: A frequent or continuous feeling that you need to have a bowel movement  Rectal bleeding  The passing of mucus through your rectum  Rectal pain  Pain on the left side of your abdomen  A feeling of fullness in your rectum  Diarrhea  Pain with bowel movements

## 2019-11-03 ENCOUNTER — Other Ambulatory Visit: Payer: Self-pay

## 2019-11-03 ENCOUNTER — Encounter: Payer: Self-pay | Admitting: Gastroenterology

## 2019-11-03 ENCOUNTER — Ambulatory Visit (INDEPENDENT_AMBULATORY_CARE_PROVIDER_SITE_OTHER): Payer: Medicare Other | Admitting: Gastroenterology

## 2019-11-03 DIAGNOSIS — K625 Hemorrhage of anus and rectum: Secondary | ICD-10-CM | POA: Diagnosis not present

## 2019-11-03 DIAGNOSIS — K627 Radiation proctitis: Secondary | ICD-10-CM

## 2019-11-03 NOTE — Patient Instructions (Addendum)
1. Call in a couple of weeks if you continue to have rectal bleeding.  2. When you go for labs for Dr. Dorris Fetch, please have our labs done at the same time. See enclosed orders.  3. We will see you back in June 2021, call sooner if you need anything!

## 2019-11-03 NOTE — Progress Notes (Signed)
Primary Care Physician:  Alycia Rossetti, MD Primary GI:  Barney Drain, MD   Patient Location: Home  Provider Location: [facility] office  Reason for Visit:  Chief Complaint  Patient presents with  . Rectal Bleeding    several times a week, ranges from bright-dark red, denies straining with BM, denies constipation/diarrhea   Persons present on the virtual encounter, with roles: Patient, myself (provider),Mindy Estudillo CMA (updated meds and allergies)  Total time (minutes) spent on medical discussion: 10 minutes  Due to COVID-19, visit was conducted using [email or web address] method.  Visit was requested by patient.  Virtual Visit via [email or web address]  I connected with [name] on 11/03/19 at 11:30 AM EST by [email or web address] and verified that I am speaking with the correct person using two identifiers.   I discussed the limitations, risks, security and privacy concerns of performing an evaluation and management service by telephone/video and the availability of in person appointments. I also discussed with the patient that there may be a patient responsible charge related to this service. The patient expressed understanding and agreed to proceed.   HPI:   [name] is a 69 y.o. male who presents for virtual visit regarding rectal bleeding/radiation proctitis. He has flex sig with APC treatment of radiation proctitis on 12/28 (actively bleeding during exam). He had APC at time of colonoscopy in 10/2018 as well.  Recently notes rectal bleeding over the past couple of months.  Would have several times per week but not every BM. Generally has one BM daily. No rectal pain. No abdominal pain. No UGI symptoms.   He has h/o anemia. Overall has been fairly stable last 18 months. Last check 07/2019.   Patient states he feels like his rectal bleeding is significantly better since the APC treatment on 12/28. Much less bleeding. Some amount on toilet tissue. Has only been a week.   Current Outpatient Medications    Medication Sig Dispense Refill  . acetaminophen (TYLENOL) 325 MG tablet Take 600 mg by mouth daily as needed for mild pain, moderate pain or headache.     Marland Kitchen atenolol (TENORMIN) 25 MG tablet TAKE 1 TABLET BY MOUTH EVERY DAY (Patient taking differently: Take 25 mg by mouth daily. ) 90 tablet 1  . atorvastatin (LIPITOR) 80 MG tablet TAKE 1 TABLET BY MOUTH EVERY DAY (Patient taking differently: Take 80 mg by mouth daily. ) 90 tablet 3  . B-D UF III MINI PEN NEEDLES 31G X 5 MM MISC USE FOUR TIMES DAILY AS DIRECTED 150 each 2  . Calcium Carb-Cholecalciferol (CALCIUM PLUS VITAMIN D3) 600-500 MG-UNIT CAPS Take 2 capsules by mouth daily.     . clopidogrel (PLAVIX) 75 MG tablet TAKE 1 TABLET BY MOUTH EVERY DAY (Patient taking differently: Take 75 mg by mouth daily. ) 90 tablet 1  . CONTOUR TEST test strip TEST TWICE DAILY E11.65 200 each 2  . furosemide (LASIX) 40 MG tablet TAKE 1 TABLET BY MOUTH EVERY DAY (Patient taking differently: Take 40 mg by mouth daily. ) 90 tablet 0  . glucose blood (CONTOUR NEXT TEST) test strip Use as instructed bid. E11.65 100 each 5  . glucose blood test strip 1 each by Other route 2 (two) times daily. Use as instructed bid. E11.65 Contour next 300 each 2  . insulin degludec (TRESIBA FLEXTOUCH) 100 UNIT/ML SOPN FlexTouch Pen Inject 20 Units into the skin at bedtime.    . metFORMIN (GLUCOPHAGE) 500 MG tablet TAKE 1 TABLET BY MOUTH  TWICE A DAY (Patient taking differently: Take 500 mg by mouth 2 (two) times daily with a meal. ) 180 tablet 0  . mirabegron ER (MYRBETRIQ) 25 MG TB24 tablet Take 25 mg by mouth daily.    . Semaglutide 0.25 or 0.5 MG/DOSE SOPN Inject 0.5 mg into the skin every Monday.      No current facility-administered medications for this visit.    Past Medical History:  Diagnosis Date  . Arthritis   . Cerebrovascular disease    MRI shows Right carotid inferior cavernours narrowing 75% and  50-75% stenosis of Cavernous and supraclinoi right side  . CKD  (chronic kidney disease) 06/28/2014   Sees Dr Florene Glen  . Hyperlipidemia   . Hypertension   . Myocardial infarction ([place])    "mild" heart attack  . Necrosis ([place])    #2 nail   . Pneumonia    as a child  . Poor circulation of extremity   . Prostate cancer ([place]) 2017   Prostate  . Sleep apnea    uses cpap, getting a new one  . Type 2 Diabetes mellitus    Type 2    Past Surgical History:  Procedure Laterality Date  . ABOVE KNEE LEG AMPUTATION Left 2004   started out below knee and then extended to above knee due to poor healing  . AMPUTATION Right 04/28/2015   Procedure: AMPUTATION RAY, RIGHT 5TH TOE;  Surgeon: Marybelle Killings, MD;  Location: [place];  Service: Orthopedics;  Laterality: Right;  . AMPUTATION Right 05/10/2015   Procedure: Right Below Knee Amputation;  Surgeon: Marybelle Killings, MD;  Location: [place];  Service: Orthopedics;  Laterality: Right;  . CATARACT EXTRACTION W/PHACO  09/05/2012   Procedure: CATARACT EXTRACTION PHACO AND INTRAOCULAR LENS PLACEMENT (IOC);  Surgeon: Tonny Branch, MD;  Location: [facility];  Service: Ophthalmology;  Laterality: Left;  CDE=5.45  . CATARACT EXTRACTION W/PHACO  10/03/2012   Procedure: CATARACT EXTRACTION PHACO AND INTRAOCULAR LENS PLACEMENT (IOC);  Surgeon: Tonny Branch, MD;  Location: [facility];  Service: Ophthalmology;  Laterality: Right;  CDE: 12.31  . COLONOSCOPY  2011   Dr. Deatra Ina: colon polyps, tubular adenoma  . COLONOSCOPY N/A 11/01/2018   Dr. Gala Romney: Blood noted in the rectal vault.  Vascular pattern of the rectum abnormal, neovascular changes distally and actively oozing.  There were 3 2 to 5 mm polyps in the splenic flexure, cecum removed.  Cecal polyp was not recovered.  Radiation proctitis status post APC treatment.  The splenic flexure polyps were tubular adenomas.  Next colonoscopy in 5 years.  Marland Kitchen FLEXIBLE SIGMOIDOSCOPY N/A 10/27/2019   Procedure: FLEXIBLE SIGMOIDOSCOPY;  Surgeon: Danie Binder, MD;  [facility];  Service: Endoscopy;   Laterality: N/A;  3:15  . POLYPECTOMY  11/01/2018   Procedure: POLYPECTOMY;  Surgeon: Daneil Dolin, MD;  [facility];  Service: Endoscopy;;  cecum,splenic flexure    Family History  Problem Relation Age of Onset  . Diabetes Mother   . Hypertension Mother   . Cancer Father        lung  . Cancer Sister        breast  . Sickle cell anemia Daughter   . Cancer Maternal Grandfather        prostate  . Colon cancer Neg Hx     Social History   Socioeconomic History  . Marital status: Married    Spouse name: Not on file  . Number of children: 2  .  Years of education: college  . Highest education level: Not on file  Occupational History  . Occupation: Mining engineer: [employer]  Tobacco Use  . Smoking status: Never Smoker  . Smokeless tobacco: Never Used  Substance and Sexual Activity  . Alcohol use: No  . Drug use: No  . Sexual activity: Never  Other Topics Concern  . Not on file  Social History Narrative   Patient lives with his wife    Patient right handed   Patient drinks caffine on occ.   Social Determinants of Health   Financial Resource Strain:   . Difficulty of Paying Living Expenses: Not on file  Food Insecurity:   . Worried About Charity fundraiser in the Last Year: Not on file  . Ran Out of Food in the Last Year: Not on file  Transportation Needs:   . Lack of Transportation (Medical): Not on file  . Lack of Transportation (Non-Medical): Not on file  Physical Activity:   . Days of Exercise per Week: Not on file  . Minutes of Exercise per Session: Not on file  Stress:   . Feeling of Stress : Not on file  Social Connections:   . Frequency of Communication with Friends and Family: Not on file  . Frequency of Social Gatherings with Friends and Family: Not on file  . Attends Religious Services: Not on file  . Active Member of Clubs or Organizations: Not on file  . Attends Archivist Meetings: Not on file  . Marital  Status: Not on file  Intimate Partner Violence:   . Fear of Current or Ex-Partner: Not on file  . Emotionally Abused: Not on file  . Physically Abused: Not on file  . Sexually Abused: Not on file      ROS:  General: Negative for anorexia, weight loss, fever, chills, fatigue, weakness. Eyes: Negative for vision changes.  ENT: Negative for hoarseness, difficulty swallowing , nasal congestion. CV: Negative for chest pain, angina, palpitations, dyspnea on exertion, peripheral edema.  Respiratory: Negative for dyspnea at rest, dyspnea on exertion, cough, sputum, wheezing.  GI: See history of present illness. GU:  Negative for dysuria, hematuria, urinary incontinence, urinary frequency, nocturnal urination.  MS: Negative for joint pain, low back pain.  Derm: Negative for rash or itching.  Neuro: Negative for weakness, abnormal sensation, seizure, frequent headaches, memory loss, confusion.  Psych: Negative for anxiety, depression, suicidal ideation, hallucinations.  Endo: Negative for unusual weight change.  Heme: Negative for bruising or bleeding. Allergy: Negative for rash or hives.   Observations/Objective: Pleasant well nourished male in NAD.  Lab Results  Component Value Date   CREATININE 1.28 (H) 08/26/2019   BUN 30 (H) 08/26/2019   NA 143 08/26/2019   K 4.3 08/26/2019   CL 107 08/26/2019   CO2 21 08/26/2019   Lab Results  Component Value Date   WBC 5.0 08/26/2019   HGB 10.7 (L) 08/26/2019   HCT 32.5 (L) 08/26/2019   MCV 89.3 08/26/2019   PLT 271 08/26/2019   Lab Results  Component Value Date   HGBA1C 6.1 (H) 08/26/2019     Assessment and Plan: 69 y/o male with history of radiation proctitis with rectal bleeding underwent flex sig with APC one week ago. Also had APC in 10/2018. Patient notes significant improvement in rectal bleeding. Suspect continued improvement over next week. If he has persistent rectal bleeding he will let me know and we could try additional  of topical regimen for radiation proctitis.   Update CBC in 12/2019 when he has labs for Dr. Dorris Fetch.   Return to the office in June as planned.    Follow Up Instructions:    I discussed the assessment and treatment plan with the patient. The patient was provided an opportunity to ask questions and all were answered. The patient agreed with the plan and demonstrated an understanding of the instructions. AVS mailed to patient's home address.   The patient was advised to call back or seek an in-person evaluation if the symptoms worsen or if the condition fails to improve as anticipated.  I provided 10 minutes of virtual face-to-face time during this encounter.   Neil Crouch, PA-C

## 2019-11-07 ENCOUNTER — Other Ambulatory Visit: Payer: Self-pay | Admitting: "Endocrinology

## 2019-11-08 ENCOUNTER — Other Ambulatory Visit: Payer: Self-pay | Admitting: Family Medicine

## 2019-11-10 ENCOUNTER — Other Ambulatory Visit: Payer: Self-pay

## 2019-11-10 DIAGNOSIS — C61 Malignant neoplasm of prostate: Secondary | ICD-10-CM

## 2019-11-20 ENCOUNTER — Telehealth: Payer: Self-pay | Admitting: Urology

## 2019-11-20 NOTE — Telephone Encounter (Signed)
Pt wants to see if we have samples of myrbetriq.

## 2019-11-20 NOTE — Telephone Encounter (Signed)
Pt notified of samples being available and will leave at front desk for pt to pick up when convenient 25 mg will be left

## 2019-11-25 ENCOUNTER — Other Ambulatory Visit (HOSPITAL_COMMUNITY): Payer: Medicare Other

## 2019-11-26 DIAGNOSIS — K625 Hemorrhage of anus and rectum: Secondary | ICD-10-CM | POA: Diagnosis not present

## 2019-11-26 DIAGNOSIS — E1159 Type 2 diabetes mellitus with other circulatory complications: Secondary | ICD-10-CM | POA: Diagnosis not present

## 2019-11-27 LAB — COMPLETE METABOLIC PANEL WITH GFR
AG Ratio: 1.2 (calc) (ref 1.0–2.5)
ALT: 34 U/L (ref 9–46)
AST: 27 U/L (ref 10–35)
Albumin: 3.5 g/dL — ABNORMAL LOW (ref 3.6–5.1)
Alkaline phosphatase (APISO): 98 U/L (ref 35–144)
BUN/Creatinine Ratio: 17 (calc) (ref 6–22)
BUN: 22 mg/dL (ref 7–25)
CO2: 22 mmol/L (ref 20–32)
Calcium: 9.4 mg/dL (ref 8.6–10.3)
Chloride: 108 mmol/L (ref 98–110)
Creat: 1.27 mg/dL — ABNORMAL HIGH (ref 0.70–1.25)
GFR, Est African American: 67 mL/min/{1.73_m2} (ref >=60)
GFR, Est Non African American: 58 mL/min/{1.73_m2} — ABNORMAL LOW (ref >=60)
Globulin: 2.9 g/dL (calc) (ref 1.9–3.7)
Glucose, Bld: 106 mg/dL (ref 65–139)
Potassium: 4.4 mmol/L (ref 3.5–5.3)
Sodium: 145 mmol/L (ref 135–146)
Total Bilirubin: 0.3 mg/dL (ref 0.2–1.2)
Total Protein: 6.4 g/dL (ref 6.1–8.1)

## 2019-11-27 LAB — CBC WITH DIFFERENTIAL/PLATELET
Absolute Monocytes: 424 cells/uL (ref 200–950)
Basophils Absolute: 22 cells/uL (ref 0–200)
Basophils Relative: 0.4 %
Eosinophils Absolute: 110 cells/uL (ref 15–500)
Eosinophils Relative: 2 %
HCT: 35.6 % — ABNORMAL LOW (ref 38.5–50.0)
Hemoglobin: 11.5 g/dL — ABNORMAL LOW (ref 13.2–17.1)
Lymphs Abs: 1661 cells/uL (ref 850–3900)
MCH: 29.5 pg (ref 27.0–33.0)
MCHC: 32.3 g/dL (ref 32.0–36.0)
MCV: 91.3 fL (ref 80.0–100.0)
MPV: 9.5 fL (ref 7.5–12.5)
Monocytes Relative: 7.7 %
Neutro Abs: 3284 cells/uL (ref 1500–7800)
Neutrophils Relative %: 59.7 %
Platelets: 283 10*3/uL (ref 140–400)
RBC: 3.9 10*6/uL — ABNORMAL LOW (ref 4.20–5.80)
RDW: 14.4 % (ref 11.0–15.0)
Total Lymphocyte: 30.2 %
WBC: 5.5 10*3/uL (ref 3.8–10.8)

## 2019-11-27 LAB — HEMOGLOBIN A1C
Hgb A1c MFr Bld: 6 % of total Hgb — ABNORMAL HIGH (ref <5.7)
Mean Plasma Glucose: 126 (calc)
eAG (mmol/L): 7 (calc)

## 2019-12-01 ENCOUNTER — Encounter: Payer: Self-pay | Admitting: "Endocrinology

## 2019-12-01 ENCOUNTER — Ambulatory Visit (INDEPENDENT_AMBULATORY_CARE_PROVIDER_SITE_OTHER): Payer: Medicare Other | Admitting: "Endocrinology

## 2019-12-01 DIAGNOSIS — I10 Essential (primary) hypertension: Secondary | ICD-10-CM

## 2019-12-01 DIAGNOSIS — E1159 Type 2 diabetes mellitus with other circulatory complications: Secondary | ICD-10-CM | POA: Diagnosis not present

## 2019-12-01 DIAGNOSIS — E782 Mixed hyperlipidemia: Secondary | ICD-10-CM | POA: Diagnosis not present

## 2019-12-01 NOTE — Progress Notes (Signed)
12/01/2019                                                      Endocrinology Telehealth Visit Follow up Note -During COVID -19 Pandemic  This visit type was conducted due to national recommendations for restrictions regarding the COVID-19 Pandemic  in an effort to limit this patient's exposure and mitigate transmission of the corona virus.  Due to his co-morbid illnesses, [name] is at  moderate to high risk for complications without adequate follow up.  This format is felt to be most appropriate for him at this time.  I connected with this patient on 12/01/2019   by telephone and verified that I am speaking with the correct person using two identifiers. August, 1951-06-15. he has verbally consented to this visit. All issues noted in this document were discussed and addressed. The format was not optimal for physical exam.     Subjective:    Patient ID: [name], male    DOB: Mar 20, 1951, PCP Tom Rossetti, MD   Past Medical History:  Diagnosis Date  . Arthritis   . Cerebrovascular disease    MRI shows Right carotid inferior cavernours narrowing 75% and  50-75% stenosis of Cavernous and supraclinoi right side  . CKD (chronic kidney disease) 06/28/2014   Sees Dr Florene Glen  . Hyperlipidemia   . Hypertension   . Myocardial infarction ([place])    "mild" heart attack  . Necrosis ([place])    #2 nail   . Pneumonia    as a child  . Poor circulation of extremity   . Prostate cancer ([place]) 2017   Prostate  . Sleep apnea    uses cpap, getting a new one  . Type 2 Diabetes mellitus    Type 2   Past Surgical History:  Procedure Laterality Date  . ABOVE KNEE LEG AMPUTATION Left 2004   started out below knee and then extended to above knee due to poor healing  . AMPUTATION Right 04/28/2015   Procedure: AMPUTATION RAY, RIGHT 5TH TOE;  Surgeon: Marybelle Killings, MD;  Location: [facility];  Service: Orthopedics;  Laterality: Right;  . AMPUTATION Right 05/10/2015   Procedure: Right Below Knee  Amputation;  Surgeon: Marybelle Killings, MD;  Location: [facility];  Service: Orthopedics;  Laterality: Right;  . CATARACT EXTRACTION W/PHACO  09/05/2012   Procedure: CATARACT EXTRACTION PHACO AND INTRAOCULAR LENS PLACEMENT (IOC);  Surgeon: Tonny Branch, MD;  Location: [facility];  Service: Ophthalmology;  Laterality: Left;  CDE=5.45  . CATARACT EXTRACTION W/PHACO  10/03/2012   Procedure: CATARACT EXTRACTION PHACO AND INTRAOCULAR LENS PLACEMENT (IOC);  Surgeon: Tonny Branch, MD;  Location: [facility];  Service: Ophthalmology;  Laterality: Right;  CDE: 12.31  . COLONOSCOPY  2011   Dr. Deatra Ina: colon polyps, tubular adenoma  . COLONOSCOPY N/A 11/01/2018   Dr. Gala Romney: Blood noted in the rectal vault.  Vascular pattern of the rectum abnormal, neovascular changes distally and actively oozing.  There were 3 2 to 5 mm polyps in the splenic flexure, cecum removed.  Cecal polyp was not recovered.  Radiation proctitis status post APC treatment.  The splenic flexure polyps were tubular adenomas.  Next colonoscopy in 5 years.  Marland Kitchen FLEXIBLE SIGMOIDOSCOPY N/A 10/27/2019   Procedure: FLEXIBLE SIGMOIDOSCOPY;  Surgeon: Danie Binder, MD;  [facility];  Service: Endoscopy;  Laterality: N/A;  3:15  . POLYPECTOMY  11/01/2018   Procedure: POLYPECTOMY;  Surgeon: Daneil Dolin, MD;  [facility];  Service: Endoscopy;;  cecum,splenic flexure   Social History   Socioeconomic History  . Marital status: Married    Spouse name: Not on file  . Number of children: 2  . Years of education: college  . Highest education level: Not on file  Occupational History  . Occupation: Mining engineer: [employer]  Tobacco Use  . Smoking status: Never Smoker  . Smokeless tobacco: Never Used  Substance and Sexual Activity  . Alcohol use: No  . Drug use: No  . Sexual activity: Never  Other Topics Concern  . Not on file  Social History Narrative   Patient lives with his wife    Patient right handed   Patient  drinks caffine on occ.   Social Determinants of Health   Financial Resource Strain:   . Difficulty of Paying Living Expenses: Not on file  Food Insecurity:   . Worried About Charity fundraiser in the Last Year: Not on file  . Ran Out of Food in the Last Year: Not on file  Transportation Needs:   . Lack of Transportation (Medical): Not on file  . Lack of Transportation (Non-Medical): Not on file  Physical Activity:   . Days of Exercise per Week: Not on file  . Minutes of Exercise per Session: Not on file  Stress:   . Feeling of Stress : Not on file  Social Connections:   . Frequency of Communication with Friends and Family: Not on file  . Frequency of Social Gatherings with Friends and Family: Not on file  . Attends Religious Services: Not on file  . Active Member of Clubs or Organizations: Not on file  . Attends Archivist Meetings: Not on file  . Marital Status: Not on file   Outpatient Encounter Medications as of 12/01/2019  Medication Sig  . acetaminophen (TYLENOL) 325 MG tablet Take 600 mg by mouth daily as needed for mild pain, moderate pain or headache.   Marland Kitchen atenolol (TENORMIN) 25 MG tablet TAKE 1 TABLET BY MOUTH EVERY DAY (Patient taking differently: Take 25 mg by mouth daily. )  . atorvastatin (LIPITOR) 80 MG tablet Take 1 tablet (80 mg total) by mouth daily.  . B-D UF III MINI PEN NEEDLES 31G X 5 MM MISC USE FOUR TIMES DAILY AS DIRECTED  . Calcium Carb-Cholecalciferol (CALCIUM PLUS VITAMIN D3) 600-500 MG-UNIT CAPS Take 2 capsules by mouth daily.   . clopidogrel (PLAVIX) 75 MG tablet TAKE 1 TABLET BY MOUTH EVERY DAY (Patient taking differently: Take 75 mg by mouth daily. )  . CONTOUR TEST test strip TEST TWICE DAILY E11.65  . furosemide (LASIX) 40 MG tablet TAKE 1 TABLET BY MOUTH EVERY DAY (Patient taking differently: Take 40 mg by mouth daily. )  . glucose blood (CONTOUR NEXT TEST) test strip Use as instructed bid. E11.65  . glucose blood test strip 1 each by  Other route 2 (two) times daily. Use as instructed bid. E11.65 Contour next  . insulin degludec (TRESIBA FLEXTOUCH) 100 UNIT/ML SOPN FlexTouch Pen Inject 20 Units into the skin at bedtime.  . metFORMIN (GLUCOPHAGE) 500 MG tablet TAKE 1 TABLET BY MOUTH TWICE A DAY  . mirabegron ER (MYRBETRIQ) 25 MG TB24 tablet Take 25 mg by mouth daily.  . Semaglutide 0.25 or  0.5 MG/DOSE SOPN Inject 0.5 mg into the skin every Monday.    No facility-administered encounter medications on file as of 12/01/2019.   ALLERGIES: Allergies  Allergen Reactions  . Zolpidem Tartrate Other (See Comments)    disorientation    VACCINATION STATUS: Immunization History  Administered Date(s) Administered  . Fluad Quad(high Dose 65+) 07/01/2019  . Influenza Split 06/30/2013, 08/30/2013  . Influenza-Unspecified 07/30/2015, 06/30/2018  . Pneumococcal Conjugate-13 01/08/2018  . Pneumococcal Polysaccharide-23 07/31/2007, 08/26/2019  . Td 10/30/2002  . Tdap 06/19/2016  . Zoster 06/19/2016    Diabetes He presents for his follow-up diabetic visit. He has type 2 diabetes mellitus. Onset time: He was diagnosed at approximate age of 78 years. His disease course has been stable. There are no hypoglycemic associated symptoms. Pertinent negatives for hypoglycemia include no confusion, pallor or seizures. There are no diabetic associated symptoms. Pertinent negatives for diabetes include no fatigue, no polydipsia, no polyphagia, no polyuria and no weakness. There are no hypoglycemic complications. Symptoms are stable. Diabetic complications include nephropathy. (Status post bilateral lower extremity amputations. He is wheelchair-bound, awaiting for leg prosthetics.) Risk factors for coronary artery disease include diabetes mellitus, dyslipidemia, hypertension, male sex, obesity, sedentary lifestyle and tobacco exposure. Current diabetic treatment includes insulin injections (He could not afford Byetta, Toujeo, due to a gap in his  insurance, currently on the on metformin 500 minute grams by mouth twice a day.). He is compliant with treatment most of the time. He is following a diabetic diet. When asked about meal planning, he reported none. He has had a previous visit with a dietitian. He never participates in exercise. His breakfast blood glucose range is generally 110-130 mg/dl. His overall blood glucose range is 110-130 mg/dl. (He reports controlled glycemic profile to near target averaging 125 mg..  No hypoglycemia reported.  He is previsit labs show A1c stable at 6%.   ) An ACE inhibitor/angiotensin II receptor blocker is being taken.  Hyperlipidemia This is a chronic problem. The current episode started more than 1 year ago. The problem is controlled. Pertinent negatives include no myalgias. Current antihyperlipidemic treatment includes statins. Risk factors for coronary artery disease include dyslipidemia, diabetes mellitus, hypertension, male sex, obesity and a sedentary lifestyle.   Review of systems: Limited as above.    Objective:    There were no vitals taken for this visit.  Wt Readings from Last 3 Encounters:  08/26/19 242 lb (109.8 kg)  05/29/19 241 lb (109.3 kg)  11/01/18 249 lb (112.9 kg)      Results for orders placed or performed in visit on 11/03/19  CBC w/Diff/Platelet  Result Value Ref Range   WBC 5.5 3.8 - 10.8 Thousand/uL   RBC 3.90 (L) 4.20 - 5.80 Million/uL   Hemoglobin 11.5 (L) 13.2 - 17.1 g/dL   HCT 35.6 (L) 38.5 - 50.0 %   MCV 91.3 80.0 - 100.0 fL   MCH 29.5 27.0 - 33.0 pg   MCHC 32.3 32.0 - 36.0 g/dL   RDW 14.4 11.0 - 15.0 %   Platelets 283 140 - 400 Thousand/uL   MPV 9.5 7.5 - 12.5 fL   Neutro Abs 3,284 1,500 - 7,800 cells/uL   Lymphs Abs 1,661 850 - 3,900 cells/uL   Absolute Monocytes 424 200 - 950 cells/uL   Eosinophils Absolute 110 15 - 500 cells/uL   Basophils Absolute 22 0 - 200 cells/uL   Neutrophils Relative % 59.7 %   Total Lymphocyte 30.2 %   Monocytes Relative 7.7  %  Eosinophils Relative 2.0 %   Basophils Relative 0.4 %   Complete Blood Count (Most recent): Lab Results  Component Value Date   WBC 5.5 11/26/2019   HGB 11.5 (L) 11/26/2019   HCT 35.6 (L) 11/26/2019   MCV 91.3 11/26/2019   PLT 283 11/26/2019   Chemistry (most recent): Lab Results  Component Value Date   NA 145 11/26/2019   K 4.4 11/26/2019   CL 108 11/26/2019   CO2 22 11/26/2019   BUN 22 11/26/2019   CREATININE 1.27 (H) 11/26/2019   Diabetic Labs (most recent): Lab Results  Component Value Date   HGBA1C 6.0 (H) 11/26/2019   HGBA1C 6.1 (H) 08/26/2019   HGBA1C 6.0 (H) 05/20/2019   Lipid Panel     Component Value Date/Time   CHOL 102 08/26/2019 1236   CHOL 158 02/23/2015 0931   TRIG 72 08/26/2019 1236   TRIG 98 10/27/2013 1305   HDL 35 (L) 08/26/2019 1236   HDL 55 02/23/2015 0931   HDL 39 (L) 10/27/2013 1305   CHOLHDL 2.9 08/26/2019 1236   VLDL 14 10/31/2016 1137   LDLCALC 52 08/26/2019 1236   LDLCALC 82 10/27/2013 1305    Assessment & Plan:   1. Type 2 diabetes mellitus with other circulatory complications (HCC)  -His  diabetes is  complicated by extensive peripheral arterial disease with bilateral lower extremity amputations and patient remains at a high risk for more acute and chronic complications of diabetes which include CAD, CVA, CKD, retinopathy, and neuropathy. These are all discussed in detail with the patient.  -He reports blood glucose readings all within target, no major hypoglycemia nor hyperglycemia.  -He maintains control of diabetes with target A1c of 6%.     Recent labs reviewed, showing stage stable  3 renal insufficiency.   - he  admits there is a room for improvement in his diet and drink choices. -  Suggestion is made for him to avoid simple carbohydrates  from his diet including Cakes, Sweet Desserts / Pastries, Ice Cream, Soda (diet and regular), Sweet Tea, Candies, Chips, Cookies, Sweet Pastries,  Store Bought Juices, Alcohol in  Excess of  1-2 drinks a day, Artificial Sweeteners, Coffee Creamer, and "Sugar-free" Products. This will help patient to have stable blood glucose profile and potentially avoid unintended weight gain.  - Patient is advised to stick to a routine mealtimes to eat 3 meals  a day and avoid unnecessary snacks ( to snack only to correct hypoglycemia).   - I have approached patient with the following individualized plan to manage diabetes and patient agrees.  - He is advised to continue Tresiba 20 units nightly,  associated with monitoring of blood glucose daily before breakfast and at any other time as needed.   -He will continue to benefit from low-dose metformin, advised to continue metformin 500 mg p.o. twice daily, as well as Ozempic 0.5 mg subcutaneously weekly (samples from clinic).  His insurance did not provide coverage for Ozempic nor Trulicity.   2) hypertension: His blood pressure is controlled,he is advised to home monitor blood pressure and report if > 140/90 on 2 separate readings. -He is advised to continue current medications including atenolol 25 mg p.o. daily, Lasix as needed.   3) Lipids/HPL: His recent lipid panel showed controlled LDL at 52.  He is advised to continue atorvastatin 80 mg p.o. nightly.    Side effects and precautions discussed with him.     - I advised patient to maintain close follow up  with Tom Rossetti, MD for primary care needs.  - Time spent on this patient care encounter:  35 min, of which >50% was spent in  counseling and the rest reviewing his  current and  previous labs/studies ( including abstraction from other facilities),  previous treatments, his blood glucose readings, and medications' doses and developing a plan for long-term care based on the latest recommendations for standards of care; and documenting his care.  Ozella Almond Ettinger participated in the discussions, expressed understanding, and voiced agreement with the above plans.  All questions  were answered to his satisfaction. he is encouraged to contact clinic should he have any questions or concerns prior to his return visit.    Follow up plan: -Return in about 4 months (around 03/30/2020), or He will send someone to pick up samples : Tresiba nd Ozempic 2 pens each, for Bring Meter and Logs- A1c in Office.  Glade Lloyd, MD Phone: [phone]  Fax: [phone]  -  This note was partially dictated with voice recognition software. Similar sounding words can be transcribed inadequately or may not  be corrected upon review.  12/01/2019, 12:48 PM

## 2019-12-08 ENCOUNTER — Other Ambulatory Visit: Payer: Self-pay

## 2019-12-08 DIAGNOSIS — R7989 Other specified abnormal findings of blood chemistry: Secondary | ICD-10-CM

## 2019-12-14 ENCOUNTER — Other Ambulatory Visit: Payer: Self-pay | Admitting: Family Medicine

## 2019-12-31 DIAGNOSIS — Z08 Encounter for follow-up examination after completed treatment for malignant neoplasm: Secondary | ICD-10-CM | POA: Diagnosis not present

## 2019-12-31 DIAGNOSIS — C61 Malignant neoplasm of prostate: Secondary | ICD-10-CM | POA: Diagnosis not present

## 2019-12-31 DIAGNOSIS — K625 Hemorrhage of anus and rectum: Secondary | ICD-10-CM | POA: Diagnosis not present

## 2019-12-31 DIAGNOSIS — Z8546 Personal history of malignant neoplasm of prostate: Secondary | ICD-10-CM | POA: Diagnosis not present

## 2019-12-31 DIAGNOSIS — Z923 Personal history of irradiation: Secondary | ICD-10-CM | POA: Diagnosis not present

## 2020-01-01 ENCOUNTER — Other Ambulatory Visit: Payer: Self-pay | Admitting: *Deleted

## 2020-01-01 DIAGNOSIS — R7989 Other specified abnormal findings of blood chemistry: Secondary | ICD-10-CM

## 2020-01-02 ENCOUNTER — Encounter: Payer: Self-pay | Admitting: *Deleted

## 2020-01-07 ENCOUNTER — Telehealth: Payer: Self-pay | Admitting: Gastroenterology

## 2020-01-07 NOTE — Telephone Encounter (Signed)
[phone] PATIENT CALLED WANTING TO KNOW IF WE COULD USE LABS FROM ANOTHER OFFICE, OR COMBINE WHAT WE NEED WITH HIS UPCOMING LABS FOR HIS PCP IN April.  STATED THAT HIS INSURANCE WILL NOT LET HIM HAVE LABS TAKEN BUT SO MANY TIMES IN A MONTH

## 2020-01-07 NOTE — Telephone Encounter (Signed)
Called pt and he said that he received our lab orders through the mail.  He said that he will have to have more labs drawn in April.  Advised pt to let his PCP know he had labs drawn so they know which labs that he had drawn.  Informed pt that his PCP has access to Epic so they can be viewed as well.  Pt voiced understanding.

## 2020-01-14 ENCOUNTER — Other Ambulatory Visit: Payer: Self-pay | Admitting: Family Medicine

## 2020-02-03 ENCOUNTER — Other Ambulatory Visit: Payer: Self-pay | Admitting: "Endocrinology

## 2020-02-24 ENCOUNTER — Other Ambulatory Visit: Payer: Self-pay

## 2020-02-24 ENCOUNTER — Encounter: Payer: Self-pay | Admitting: Family Medicine

## 2020-02-24 ENCOUNTER — Ambulatory Visit (INDEPENDENT_AMBULATORY_CARE_PROVIDER_SITE_OTHER): Payer: Medicare Other | Admitting: Family Medicine

## 2020-02-24 VITALS — BP 140/78 | HR 100 | Temp 98.6°F | Resp 16 | Ht 71.5 in | Wt 249.0 lb

## 2020-02-24 DIAGNOSIS — Z89511 Acquired absence of right leg below knee: Secondary | ICD-10-CM

## 2020-02-24 DIAGNOSIS — E1159 Type 2 diabetes mellitus with other circulatory complications: Secondary | ICD-10-CM

## 2020-02-24 DIAGNOSIS — Z89612 Acquired absence of left leg above knee: Secondary | ICD-10-CM | POA: Diagnosis not present

## 2020-02-24 DIAGNOSIS — G4733 Obstructive sleep apnea (adult) (pediatric): Secondary | ICD-10-CM | POA: Diagnosis not present

## 2020-02-24 DIAGNOSIS — Z0000 Encounter for general adult medical examination without abnormal findings: Secondary | ICD-10-CM

## 2020-02-24 DIAGNOSIS — R2681 Unsteadiness on feet: Secondary | ICD-10-CM | POA: Diagnosis not present

## 2020-02-24 DIAGNOSIS — E11319 Type 2 diabetes mellitus with unspecified diabetic retinopathy without macular edema: Secondary | ICD-10-CM | POA: Insufficient documentation

## 2020-02-24 DIAGNOSIS — E113393 Type 2 diabetes mellitus with moderate nonproliferative diabetic retinopathy without macular edema, bilateral: Secondary | ICD-10-CM

## 2020-02-24 DIAGNOSIS — Z0001 Encounter for general adult medical examination with abnormal findings: Secondary | ICD-10-CM | POA: Diagnosis not present

## 2020-02-24 DIAGNOSIS — N182 Chronic kidney disease, stage 2 (mild): Secondary | ICD-10-CM | POA: Diagnosis not present

## 2020-02-24 DIAGNOSIS — I10 Essential (primary) hypertension: Secondary | ICD-10-CM | POA: Diagnosis not present

## 2020-02-24 LAB — COMPREHENSIVE METABOLIC PANEL
AG Ratio: 1.4 (calc) (ref 1.0–2.5)
ALT: 27 U/L (ref 9–46)
AST: 21 U/L (ref 10–35)
Albumin: 3.5 g/dL — ABNORMAL LOW (ref 3.6–5.1)
Alkaline phosphatase (APISO): 89 U/L (ref 35–144)
BUN/Creatinine Ratio: 21 (calc) (ref 6–22)
BUN: 34 mg/dL — ABNORMAL HIGH (ref 7–25)
CO2: 24 mmol/L (ref 20–32)
Calcium: 9.6 mg/dL (ref 8.6–10.3)
Chloride: 105 mmol/L (ref 98–110)
Creat: 1.61 mg/dL — ABNORMAL HIGH (ref 0.70–1.25)
Globulin: 2.5 g/dL (calc) (ref 1.9–3.7)
Glucose, Bld: 105 mg/dL — ABNORMAL HIGH (ref 65–99)
Potassium: 4.2 mmol/L (ref 3.5–5.3)
Sodium: 142 mmol/L (ref 135–146)
Total Bilirubin: 0.4 mg/dL (ref 0.2–1.2)
Total Protein: 6 g/dL — ABNORMAL LOW (ref 6.1–8.1)

## 2020-02-24 LAB — LIPID PANEL
Cholesterol: 97 mg/dL (ref <200)
HDL: 32 mg/dL — ABNORMAL LOW (ref >=40)
LDL Cholesterol (Calc): 50 mg/dL (calc)
Non-HDL Cholesterol (Calc): 65 mg/dL (calc) (ref <130)
Total CHOL/HDL Ratio: 3 (calc) (ref <5.0)
Triglycerides: 74 mg/dL (ref <150)

## 2020-02-24 LAB — CBC WITH DIFFERENTIAL/PLATELET
Absolute Monocytes: 402 cells/uL (ref 200–950)
Basophils Absolute: 29 cells/uL (ref 0–200)
Basophils Relative: 0.6 %
Eosinophils Absolute: 123 cells/uL (ref 15–500)
Eosinophils Relative: 2.5 %
HCT: 34.7 % — ABNORMAL LOW (ref 38.5–50.0)
Hemoglobin: 11.3 g/dL — ABNORMAL LOW (ref 13.2–17.1)
Lymphs Abs: 1431 cells/uL (ref 850–3900)
MCH: 29.4 pg (ref 27.0–33.0)
MCHC: 32.6 g/dL (ref 32.0–36.0)
MCV: 90.1 fL (ref 80.0–100.0)
MPV: 9.4 fL (ref 7.5–12.5)
Monocytes Relative: 8.2 %
Neutro Abs: 2916 cells/uL (ref 1500–7800)
Neutrophils Relative %: 59.5 %
Platelets: 254 10*3/uL (ref 140–400)
RBC: 3.85 10*6/uL — ABNORMAL LOW (ref 4.20–5.80)
RDW: 15.5 % — ABNORMAL HIGH (ref 11.0–15.0)
Total Lymphocyte: 29.2 %
WBC: 4.9 10*3/uL (ref 3.8–10.8)

## 2020-02-24 NOTE — Progress Notes (Addendum)
Subjective:   Patient presents for Medicare Annual/Subsequent preventive examination.    Chronic kidney disease he is still followed by nephrology is typically seen once a year, last Cr 1.27 in Jan with GFR 67    DM-  Following with endocrinology- last A1C 6.0%  In Jan 2021               Tresiba  20 units at bedtime , metformin 500mg  BID , Ozempic 0.5  once a week ( gets from endocrinology samples)     Prostate cancer ( [facility] May) - no recurrence still under surveillance ,  on Myrbetriq from Urology when he is able to afford     Followed by Nephrology- once a day     Hyperlipidemia- taking lipitor without difficulty     OSA- using CPAP every night without any difficulty     HTN- taking lasix 40mg  Three times a week  and atenolol   He is followed by GI , had radiation proctitis in December and underwent flexible sigmoidoscopy , next F/U in June   Has DMV form to be completed   Review Past Medical/Family/Social: per EMR   Risk Factors  Current exercise habits: tries to do his PT exercises , he feels upper body strength is good  Dietary issues discussed: YES  Cardiac risk factors: Obesity (BMI >= 30 kg/m2). DM, HTN  Depression Screen  (Note: if answer to either of the following is "Yes", a more complete depression screening is indicated)  Over the past two weeks, have you felt down, depressed or hopeless? No Over the past two weeks, have you felt little interest or pleasure in doing things? No Have you lost interest or pleasure in daily life? No Do you often feel hopeless? No Do you cry easily over simple problems? No   Activities of Daily Living  In your present state of health, do you have any difficulty performing the following activities?:  Driving? No  Managing money? No  Feeding yourself? No  Getting from bed to chair? No  Climbing a flight of stairs? No  Preparing food and eating?: No  Bathing or showering? No  Getting dressed: No   Getting to the toilet? No  Using the toilet:No  Moving around from place to place: No  In the past year have you fallen or had a near fall? yes Are you sexually active? No  Do you have more than one partner? No   Hearing Difficulties: No  Do you often ask people to speak up or repeat themselves? No  Do you experience ringing or noises in your ears? No Do you have difficulty understanding soft or whispered voices? No  Do you feel that you have a problem with memory? No Do you often misplace items? No  Do you feel safe at home? Yes  Cognitive Testing  Alert? Yes Normal Appearance?Yes  Oriented to person? Yes Place? Yes  Time? Yes  Recall of three objects? Yes  Can perform simple calculations? Yes  Displays appropriate judgment?Yes  Can read the correct time from a watch face?Yes   List the Names of Other Physician/Practitioners you currently use:  [facility] in [place]    , [place]  Nephrology - Dr. Royce Macadamia at [facility] oncology   Endocrinology  Screening Tests / Date UTD Colonoscopy                     Zostavax  COVID-19  Pneumonia Tetanus/tdap  ROS:  GEN- denies fatigue, fever, weight loss,weakness, recent illness HEENT- denies eye drainage, change in vision, nasal discharge, CVS- denies chest pain, palpitations RESP- denies SOB, cough, wheeze ABD- denies N/V, change in stools, abd pain GU- denies dysuria, hematuria, dribbling, incontinence MSK- denies joint pain, muscle aches, injury Neuro- denies headache, dizziness, syncope, seizure activity  PHYSICAL vitals reviewed , using walker  GEN- NAD, alert and oriented x3 HEENT- PERRL, EOMI, non injected sclera, pink conjunctiva, MMM, oropharynx clear Neck- Supple, no bruit CVS- RRR, no murmur RESP-CTAB EXT- bilat amputee with prosthesis bilat MSK- FROM upper ext, strength 5/5 bilat sensation in tact UE, , bilat LE prosthesis Pulses- Radial 2+   Assessment:     Annual wellness medicare exam   Plan:    During the course of the visit the patient was educated and counseled about appropriate screening and preventive services including:   CAGE/Fall/Depression screen neg   DM- per endocrinology, well controlled  - foot exam not indicated - eye exam  UTD done in November , he has bilat diabetic retinopathy    CKD- followed by nephrology , improved renal function   Prostate cancer- per urology, some OAB symptoms since treatment, continue myrbetriq  25mg   HTN- controlled  bilat prosthesis/ AKA and BKA- forms to be completed for DMV ,    Addendum: pt requires script for stump sleeves in order to provide proper fitting of prosthesis and reduce risk of ulceration/swelling.   Given info on advanced directives     Diet review for nutrition referral? Yes ____ Not Indicated __x__  Patient Instructions (the written plan) was given to the patient.  Medicare Attestation  I have personally reviewed:  The patient's medical and social history  Their use of alcohol, tobacco or illicit drugs  Their current medications and supplements  The patient's functional ability including ADLs,fall risks, home safety risks, cognitive, and hearing and visual impairment  Diet and physical activities  Evidence for depression or mood disorders  The patient's weight, height, BMI, and visual acuity have been recorded in the chart. I have made referrals, counseling, and provided education to the patient based on review of the above and I have provided the patient with a written personalized care plan for preventive services.

## 2020-02-24 NOTE — Patient Instructions (Addendum)
F/U 6 months  We will call with lab results  

## 2020-03-02 ENCOUNTER — Telehealth: Payer: Self-pay | Admitting: Family Medicine

## 2020-03-02 NOTE — Chronic Care Management (AMB) (Signed)
  Chronic Care Management   Note  03/02/2020 Name: ZOLTAN GENEST MRN: [id number] DOB: 10/06/1951  [name] is a 69 y.o. year old male who is a primary care patient of [place], Modena Nunnery, MD. I reached out to [name] by phone today in response to a referral sent by Mr. Trysten Bernard Hayman's PCP, Buelah Manis, Modena Nunnery, MD.   Mr. Pursell was given information about Chronic Care Management services today including:  1. CCM service includes personalized support from designated clinical staff supervised by his physician, including individualized plan of care and coordination with other care providers 2. 24/7 contact phone numbers for assistance for urgent and routine care needs. 3. Service will only be billed when office clinical staff spend 20 minutes or more in a month to coordinate care. 4. Only one practitioner may furnish and bill the service in a calendar month. 5. The patient may stop CCM services at any time (effective at the end of the month) by phone call to the office staff.   Patient agreed to services and verbal consent obtained.   Follow up plan:   [place]

## 2020-03-12 ENCOUNTER — Other Ambulatory Visit: Payer: Self-pay | Admitting: Family Medicine

## 2020-03-12 DIAGNOSIS — E1159 Type 2 diabetes mellitus with other circulatory complications: Secondary | ICD-10-CM

## 2020-03-12 DIAGNOSIS — E113393 Type 2 diabetes mellitus with moderate nonproliferative diabetic retinopathy without macular edema, bilateral: Secondary | ICD-10-CM

## 2020-03-12 DIAGNOSIS — N182 Chronic kidney disease, stage 2 (mild): Secondary | ICD-10-CM

## 2020-03-12 DIAGNOSIS — I10 Essential (primary) hypertension: Secondary | ICD-10-CM

## 2020-03-12 NOTE — Chronic Care Management (AMB) (Signed)
Chronic Care Management Pharmacy  Name: [name]  MRN: [id number] DOB: April 01, 1951  Chief Complaint/ HPI  [name],  69 y.o. , male presents for their Initial CCM visit with the clinical pharmacist via telephone.  PCP : Tom Rossetti, MD  Their chronic conditions include: Type II DM, hypertension, CKD, hyperlipidemia.  Office Visits:  02/24/2020 [facility]) - general physical, renal function had improved,   Consult Visit:  12/31/2019 [name], Oncology) - prostate cancer high risk, followed by urology also, PSA has remained unremarkable, no urinary symptoms  12/01/2019 ([name], endocrinology) - is getting samples for [name] and [name]   Medications: Outpatient Encounter Medications as of 03/15/2020  Medication Sig  . acetaminophen (TYLENOL) 325 MG tablet Take 600 mg by mouth daily as needed for mild pain, moderate pain or headache.   Marland Kitchen atenolol (TENORMIN) 25 MG tablet TAKE 1 TABLET BY MOUTH EVERY DAY  . atorvastatin (LIPITOR) 80 MG tablet Take 1 tablet (80 mg total) by mouth daily.  . B-D UF III MINI PEN NEEDLES 31G X 5 MM MISC USE FOUR TIMES DAILY AS DIRECTED  . Calcium Carb-Cholecalciferol (CALCIUM PLUS VITAMIN D3) 600-500 MG-UNIT CAPS Take 2 capsules by mouth daily.   . clopidogrel (PLAVIX) 75 MG tablet TAKE 1 TABLET BY MOUTH EVERY DAY  . CONTOUR TEST test strip TEST TWICE DAILY E11.65  . furosemide (LASIX) 40 MG tablet TAKE 1 TABLET BY MOUTH EVERY DAY (Patient taking differently: Take 40 mg by mouth. Taking Monday, Wednesday, friday)  . glucose blood (CONTOUR NEXT TEST) test strip Use as instructed bid. E11.65  . glucose blood test strip 1 each by Other route 2 (two) times daily. Use as instructed bid. E11.65 Contour next  . insulin degludec (TRESIBA FLEXTOUCH) 100 UNIT/ML SOPN FlexTouch Pen Inject 20 Units into the skin at bedtime.  . metFORMIN (GLUCOPHAGE) 500 MG tablet TAKE 1 TABLET BY MOUTH TWICE A DAY  . mirabegron ER (MYRBETRIQ) 25 MG TB24 tablet Take 25 mg by  mouth daily.  . Semaglutide 0.25 or 0.5 MG/DOSE SOPN Inject 0.5 mg into the skin every Monday.    No facility-administered encounter medications on file as of 03/15/2020.     Current Diagnosis/Assessment:  Goals Addressed            This Visit's Progress   . Care Plan:       CARE PLAN ENTRY  Current Barriers:  . Chronic Disease Management support, education, and care coordination needs related to Hypertension, Hyperlipidemia, and Diabetes   Hypertension . Pharmacist Clinical Goal(s): o Over the next 180 days, patient will work with PharmD and providers to maintain BP goal <130/80 . Current regimen:  o Atenolol 25 mg daily . Interventions: o Continue current medications o Monitor BP at least two to three times per week . Patient self care activities - Over the next 180 days, patient will: o Check BP a couple times per week, document, and provide at future appointments o Ensure daily salt intake < 2300 mg/day o Call PharmD or PCP if BP consistently above 130/80.  Hyperlipidemia . Pharmacist Clinical Goal(s): o Over the next 180 days, patient will work with PharmD and providers to maintain LDL goal < 100. . Current regimen:  o Atorvastatin 80 mg daily . Interventions: o Continue current medications . Patient self care activities - Over the next 180 days, patient will: o Contact PharmD or PCP with any concerns with refills, or other medication related events.  Diabetes . Pharmacist Clinical Goal(s):  o Over the next 180 days, patient will work with PharmD and providers to maintain A1c goal <7% . Current regimen:  o Metformin 500mg  daily o Trulicity 0.5 mg every Monday o Tresiba 20 units hs . Interventions: o Continue current medications o Look into patient assistance for brand name drugs, currently getting samples from endocrinologist . Patient self care activities - Over the next 180 days, patient will: o Check blood sugar once daily, document, and provide at future  appointments o Contact provider with any episodes of hypoglycemia    Initial goal documentation        Diabetes   Recent Relevant Labs: Lab Results  Component Value Date/Time   HGBA1C 6.0 (H) 11/26/2019 03:20 PM   HGBA1C 6.1 (H) 08/26/2019 12:36 PM   MICROALBUR 0.2 10/31/2016 11:37 AM     Checking BG: Daily  Recent FBG Readings: 106-110  Patient has failed these meds in past: [place], Novolog, Toujeo Patient is currently controlled on the following medications: metformin 500mg  twice daily, Tresiba 20 units hs, Trulicity 0.5 into the skin every Monday  Last diabetic Foot exam:  Lab Results  Component Value Date/Time   HMDIABEYEEXA Retinopathy (A) 09/29/2019 01:54 PM    Last diabetic Eye exam: No results found for: HMDIABFOOTEX   Patient currently controlled based on A1c and FBG in range.  No issues with current medication.  He gets [place] and Trulicity through samples at endocrinologist.  Wanted me to look in to patient assistance for these medications to see if he would qualify for them.  No s/sx of hypo/hyper glycemia and.  Plan  Continue current medications.  Look into patient assistance for two brand name medications to see if he qualifies.  Hypertension   Office blood pressures are  BP Readings from Last 3 Encounters:  02/24/20 140/78  10/27/19 107/79  08/26/19 124/72    Patient has failed these meds in the past: lisinopril 20mg   Patient checks BP at home rarely   Patient is currently controlled on the following medications: atenolol 25mg  daily   Recommended he start to monitor BP at home at least occasionally and record in log.  So dizziness reported and no concerns with current medicine.  Plan  Continue current medications and monitor blood pressure at least two to three times weekly.     Hyperlipidemia   Lipid Panel     Component Value Date/Time   CHOL 97 02/24/2020 0915   CHOL 158 02/23/2015 0931   TRIG 74 02/24/2020 0915   TRIG 98  10/27/2013 1305   HDL 32 (L) 02/24/2020 0915   HDL 55 02/23/2015 0931   HDL 39 (L) 10/27/2013 1305   CHOLHDL 3.0 02/24/2020 0915   VLDL 14 10/31/2016 1137   LDLCALC 50 02/24/2020 0915   LDLCALC 82 10/27/2013 1305   LABVLDL 17 02/23/2015 0931      Patient has failed these meds in past: crestor 20mg   Patient is currently controlled on the following medications: atorvastatin 80mg   No adverse effects noted, patient taking correctly     Plan  Continue current medications   Vaccines   Reviewed and discussed patient's vaccination history.    Immunization History  Administered Date(s) Administered  . Fluad Quad(high Dose 65+) 07/01/2019  . Influenza Split 06/30/2013, 08/30/2013  . Influenza-Unspecified 07/30/2015, 06/30/2018  . Moderna SARS-COVID-2 Vaccination 11/20/2019, 12/17/2019  . Pneumococcal Conjugate-13 01/08/2018  . Pneumococcal Polysaccharide-23 07/31/2007, 08/26/2019  . Td 10/30/2002  . Tdap 06/19/2016  . Zoster 06/19/2016    Plan  Patient up to date on all recommended vaccines.    Medication Management   . Miscellaneous medications: Myrbetriq ER 25mg , plavix 75mg  . OTC's: calcium/vit D 600-500 . Patient currently uses [facility].  Phone #  [phone] . Patient reports using pill box method to organize medications and promote adherence. . Patient denies missed doses of medication    Beverly Milch, PharmD Clinical Pharmacist [place] [phone]

## 2020-03-15 ENCOUNTER — Ambulatory Visit: Payer: Medicare Other | Admitting: Pharmacist

## 2020-03-15 ENCOUNTER — Other Ambulatory Visit: Payer: Self-pay

## 2020-03-15 DIAGNOSIS — E782 Mixed hyperlipidemia: Secondary | ICD-10-CM

## 2020-03-15 DIAGNOSIS — I10 Essential (primary) hypertension: Secondary | ICD-10-CM

## 2020-03-15 DIAGNOSIS — E1159 Type 2 diabetes mellitus with other circulatory complications: Secondary | ICD-10-CM

## 2020-03-15 NOTE — Patient Instructions (Addendum)
Thank you for meeting with me today!  I look forward to working with you to help you meet all of your healthcare goals and answer any questions you may have.  Feel free to contact me anytime!   Visit Information  Goals Addressed            This Visit's Progress   . Care Plan:       CARE PLAN ENTRY  Current Barriers:  . Chronic Disease Management support, education, and care coordination needs related to Hypertension, Hyperlipidemia, and Diabetes   Hypertension . Pharmacist Clinical Goal(s): o Over the next 180 days, patient will work with PharmD and providers to maintain BP goal <130/80 . Current regimen:  o Atenolol 25 mg daily . Interventions: o Continue current medications o Monitor BP at least two to three times per week . Patient self care activities - Over the next 180 days, patient will: o Check BP a couple times per week, document, and provide at future appointments o Ensure daily salt intake < 2300 mg/day o Call PharmD or PCP if BP consistently above 130/80.  Hyperlipidemia . Pharmacist Clinical Goal(s): o Over the next 180 days, patient will work with PharmD and providers to maintain LDL goal < 100. . Current regimen:  o Atorvastatin 80 mg daily . Interventions: o Continue current medications . Patient self care activities - Over the next 180 days, patient will: o Contact PharmD or PCP with any concerns with refills, or other medication related events.  Diabetes . Pharmacist Clinical Goal(s): o Over the next 180 days, patient will work with PharmD and providers to maintain A1c goal <7% . Current regimen:  o Metformin 500mg  daily o Trulicity 0.5 mg every Monday o Tresiba 20 units hs . Interventions: o Continue current medications o Look into patient assistance for brand name drugs, currently getting samples from endocrinologist . Patient self care activities - Over the next 180 days, patient will: o Check blood sugar once daily, document, and provide at  future appointments o Contact provider with any episodes of hypoglycemia    Initial goal documentation        [name] was given information about Chronic Care Management services today including:  1. CCM service includes personalized support from designated clinical staff supervised by his physician, including individualized plan of care and coordination with other care providers 2. 24/7 contact phone numbers for assistance for urgent and routine care needs. 3. Standard insurance, coinsurance, copays and deductibles apply for chronic care management only during months in which we provide at least 20 minutes of these services. Most insurances cover these services at 100%, however patients may be responsible for any copay, coinsurance and/or deductible if applicable. This service may help you avoid the need for more expensive face-to-face services. 4. Only one practitioner may furnish and bill the service in a calendar month. 5. The patient may stop CCM services at any time (effective at the end of the month) by phone call to the office staff.  Patient agreed to services and verbal consent obtained.   The patient verbalized understanding of instructions provided today and agreed to receive a mailed copy of patient instruction and/or educational materials. Telephone follow up appointment with pharmacy team member scheduled for:  09/14/2020 @ 9:00 AM  Beverly Milch, PharmD Clinical Pharmacist [facility] [phone]   Preventing Hypertension Hypertension, commonly called high blood pressure, is when the force of blood pumping through the arteries is too strong. Arteries are blood  vessels that carry blood from the heart throughout the body. Over time, hypertension can damage the arteries and decrease blood flow to important parts of the body, including the brain, heart, and kidneys. Often, hypertension does not cause symptoms until blood pressure is very high. For this  reason, it is important to have your blood pressure checked on a regular basis. Hypertension can often be prevented with diet and lifestyle changes. If you already have hypertension, you can control it with diet and lifestyle changes, as well as medicine. What nutrition changes can be made? Maintain a healthy diet. This includes:  Eating less salt (sodium). Ask your health care provider how much sodium is safe for you to have. The general recommendation is to consume less than 1 tsp (2,300 mg) of sodium a day. ? Do not add salt to your food. ? Choose low-sodium options when grocery shopping and eating out.  Limiting fats in your diet. You can do this by eating low-fat or fat-free dairy products and by eating less red meat.  Eating more fruits, vegetables, and whole grains. Make a goal to eat: ? 1-2 cups of fresh fruits and vegetables each day. ? 3-4 servings of whole grains each day.  Avoiding foods and beverages that have added sugars.  Eating fish that contain healthy fats (omega-3 fatty acids), such as mackerel or salmon. If you need help putting together a healthy eating plan, try the DASH diet. This diet is high in fruits, vegetables, and whole grains. It is low in sodium, red meat, and added sugars. DASH stands for Dietary Approaches to Stop Hypertension. What lifestyle changes can be made?   Lose weight if you are overweight. Losing just 3?5% of your body weight can help prevent or control hypertension. ? For example, if your present weight is 200 lb (91 kg), a loss of 3-5% of your weight means losing 6-10 lb (2.7-4.5 kg). ? Ask your health care provider to help you with a diet and exercise plan to safely lose weight.  Get enough exercise. Do at least 150 minutes of moderate-intensity exercise each week. ? You could do this in short exercise sessions several times a day, or you could do longer exercise sessions a few times a week. For example, you could take a brisk 10-minute walk  or bike ride, 3 times a day, for 5 days a week.  Find ways to reduce stress, such as exercising, meditating, listening to music, or taking a yoga class. If you need help reducing stress, ask your health care provider.  Do not smoke. This includes e-cigarettes. Chemicals in tobacco and nicotine products raise your blood pressure each time you smoke. If you need help quitting, ask your health care provider.  Avoid alcohol. If you drink alcohol, limit alcohol intake to no more than 1 drink a day for nonpregnant women and 2 drinks a day for men. One drink equals 12 oz of beer, 5 oz of wine, or 1 oz of hard liquor. Why are these changes important? Diet and lifestyle changes can help you prevent hypertension, and they may make you feel better overall and improve your quality of life. If you have hypertension, making these changes will help you control it and help prevent major complications, such as:  Hardening and narrowing of arteries that supply blood to: ? Your heart. This can cause a heart attack. ? Your brain. This can cause a stroke. ? Your kidneys. This can cause kidney failure.  Stress on your heart muscle,  which can cause heart failure. What can I do to lower my risk?  Work with your health care provider to make a hypertension prevention plan that works for you. Follow your plan and keep all follow-up visits as told by your health care provider.  Learn how to check your blood pressure at home. Make sure that you know your personal target blood pressure, as told by your health care provider. How is this treated? In addition to diet and lifestyle changes, your health care provider may recommend medicines to help lower your blood pressure. You may need to try a few different medicines to find what works best for you. You also may need to take more than one medicine. Take over-the-counter and prescription medicines only as told by your health care provider. Where to find support Your health  care provider can help you prevent hypertension and help you keep your blood pressure at a healthy level. Your local hospital or your community may also provide support services and prevention programs. The American Heart Association offers an online support network at: CheapBootlegs.com.cy Where to find more information Learn more about hypertension from:  [place], Lung, and Blood Institute: ElectronicHangman.is  Centers for Disease Control and Prevention: https://ingram.com/  American Academy of Family Physicians: http://familydoctor.org/familydoctor/en/diseases-conditions/high-blood-pressure.printerview.all.html Learn more about the DASH diet from:  [place], Lung, and [place]: https://www.reyes.com/ Contact a health care provider if:  You think you are having a reaction to medicines you have taken.  You have recurrent headaches or feel dizzy.  You have swelling in your ankles.  You have trouble with your vision. Summary  Hypertension often does not cause any symptoms until blood pressure is very high. It is important to get your blood pressure checked regularly.  Diet and lifestyle changes are the most important steps in preventing hypertension.  By keeping your blood pressure in a healthy range, you can prevent complications like heart attack, heart failure, stroke, and kidney failure.  Work with your health care provider to make a hypertension prevention plan that works for you. This information is not intended to replace advice given to you by your health care provider. Make sure you discuss any questions you have with your health care provider. Document Revised: 02/07/2019 Document Reviewed: 06/26/2016 Elsevier Patient Education  2020 [employer].

## 2020-03-16 ENCOUNTER — Other Ambulatory Visit: Payer: Self-pay | Admitting: Urology

## 2020-03-16 DIAGNOSIS — R7989 Other specified abnormal findings of blood chemistry: Secondary | ICD-10-CM | POA: Diagnosis not present

## 2020-03-16 DIAGNOSIS — C61 Malignant neoplasm of prostate: Secondary | ICD-10-CM | POA: Diagnosis not present

## 2020-03-16 LAB — CBC WITH DIFFERENTIAL/PLATELET
Absolute Monocytes: 343 cells/uL (ref 200–950)
Basophils Absolute: 21 cells/uL (ref 0–200)
Basophils Relative: 0.4 %
Eosinophils Absolute: 130 cells/uL (ref 15–500)
Eosinophils Relative: 2.5 %
HCT: 35.5 % — ABNORMAL LOW (ref 38.5–50.0)
Hemoglobin: 11.5 g/dL — ABNORMAL LOW (ref 13.2–17.1)
Lymphs Abs: 1342 cells/uL (ref 850–3900)
MCH: 29.3 pg (ref 27.0–33.0)
MCHC: 32.4 g/dL (ref 32.0–36.0)
MCV: 90.3 fL (ref 80.0–100.0)
MPV: 9 fL (ref 7.5–12.5)
Monocytes Relative: 6.6 %
Neutro Abs: 3364 cells/uL (ref 1500–7800)
Neutrophils Relative %: 64.7 %
Platelets: 252 10*3/uL (ref 140–400)
RBC: 3.93 10*6/uL — ABNORMAL LOW (ref 4.20–5.80)
RDW: 15.5 % — ABNORMAL HIGH (ref 11.0–15.0)
Total Lymphocyte: 25.8 %
WBC: 5.2 10*3/uL (ref 3.8–10.8)

## 2020-03-16 LAB — IRON,TIBC AND FERRITIN PANEL
%SAT: 24 % (calc) (ref 20–48)
Ferritin: 126 ng/mL (ref 24–380)
Iron: 56 ug/dL (ref 50–180)
TIBC: 235 mcg/dL (calc) — ABNORMAL LOW (ref 250–425)

## 2020-03-16 LAB — PSA: PSA: <0.1 ng/mL (ref <=4.0)

## 2020-03-16 LAB — TESTOSTERONE: Testosterone: 12 ng/dL — ABNORMAL LOW (ref 250–827)

## 2020-03-19 ENCOUNTER — Ambulatory Visit (INDEPENDENT_AMBULATORY_CARE_PROVIDER_SITE_OTHER): Payer: Medicare Other | Admitting: Urology

## 2020-03-19 ENCOUNTER — Encounter: Payer: Self-pay | Admitting: Urology

## 2020-03-19 ENCOUNTER — Other Ambulatory Visit: Payer: Self-pay

## 2020-03-19 VITALS — BP 120/75 | HR 83 | Temp 96.3°F | Ht 72.0 in | Wt 242.0 lb

## 2020-03-19 DIAGNOSIS — R351 Nocturia: Secondary | ICD-10-CM | POA: Diagnosis not present

## 2020-03-19 DIAGNOSIS — C61 Malignant neoplasm of prostate: Secondary | ICD-10-CM | POA: Diagnosis not present

## 2020-03-19 DIAGNOSIS — N3941 Urge incontinence: Secondary | ICD-10-CM | POA: Diagnosis not present

## 2020-03-19 MED ORDER — LEUPROLIDE ACETATE (6 MONTH) 45 MG ~~LOC~~ KIT
45.0000 mg | PACK | Freq: Once | SUBCUTANEOUS | Status: AC
  Administered 2020-03-19: 45 mg via SUBCUTANEOUS

## 2020-03-19 MED ORDER — MIRABEGRON ER 25 MG PO TB24: 25.0000 mg | ORAL_TABLET | Freq: Every day | ORAL | 0 refills | Status: DC

## 2020-03-19 NOTE — Progress Notes (Signed)
Urological Symptom Review  Patient is experiencing the following symptoms: Frequent urination Get up at night to urinate   Review of Systems  Gastrointestinal (upper)  : Negative for upper GI symptoms  Gastrointestinal (lower) : Negative for lower GI symptoms  Constitutional : Negative for symptoms  Skin: Negative for skin symptoms  Eyes: Negative for eye symptoms  Ear/Nose/Throat : Negative for Ear/Nose/Throat symptoms  Hematologic/Lymphatic: Negative for Hematologic/Lymphatic symptoms  Cardiovascular : Negative for cardiovascular symptoms  Respiratory : Negative for respiratory symptoms  Endocrine: Negative for endocrine symptoms  Musculoskeletal: Negative for musculoskeletal symptoms  Neurological: Negative for neurological symptoms  Psychologic: Negative for psychiatric symptoms  

## 2020-03-19 NOTE — Progress Notes (Signed)
03/19/2020 9:21 AM   [name] 01/25/1951 [id number]  Referring provider: Alycia Rossetti, MD [address] [place],  [place] [address]  Prostate Cancer and urge incontinence  HPI: Mr Friedl is a 69yo here for followup for prostate cancer and urge incontinence. PSA from 5/18 was undetectable. Intermittent hot flashes. NO new bone pain. He is on mirabegron 25mg  which improved his urgency, urge incontinence and nocturia. When he takes mirabegon he has nocturia only 1x.    His records from [facility] are as follows: Mr. Mondor returns today in f/u. He has T3 N1 M0 gleason 9 prostate cancer. A PET scan showed right external iliac node involvment. He was given Firmagon o240mg  on 06/22/17 and had gold markers placed on 07/27/18. He has EXRT that he completed in 12/18. He is due for Lupron today he got his last dose on 03/21/19. His PSA remains <0.1 with a testosterone that remains castrate. He has hot flashes on occasion but not as many. He reports no weight loss or bone pain. He has no other associated signs or symptoms.    PMH: Past Medical History:  Diagnosis Date  . Arthritis   . Cerebrovascular disease    MRI shows Right carotid inferior cavernours narrowing 75% and  50-75% stenosis of Cavernous and supraclinoi right side  . CKD (chronic kidney disease) 06/28/2014   Sees Dr Florene Glen  . Diabetic retinopathy ([place])   . Hyperlipidemia   . Hypertension   . Myocardial infarction ([place])    "mild" heart attack  . Necrosis ([place])    #2 nail   . Pneumonia    as a child  . Poor circulation of extremity   . Prostate cancer ([place]) 2017   Prostate  . Sleep apnea    uses cpap, getting a new one  . Type 2 Diabetes mellitus    Type 2    Surgical History: Past Surgical History:  Procedure Laterality Date  . ABOVE KNEE LEG AMPUTATION Left 2004   started out below knee and then extended to above knee due to poor healing  . AMPUTATION Right 04/28/2015   Procedure: AMPUTATION RAY, RIGHT 5TH TOE;   Surgeon: Marybelle Killings, MD;  Location: [facility];  Service: Orthopedics;  Laterality: Right;  . AMPUTATION Right 05/10/2015   Procedure: Right Below Knee Amputation;  Surgeon: Marybelle Killings, MD;  Location: [facility];  Service: Orthopedics;  Laterality: Right;  . CATARACT EXTRACTION W/PHACO  09/05/2012   Procedure: CATARACT EXTRACTION PHACO AND INTRAOCULAR LENS PLACEMENT (IOC);  Surgeon: Tonny Branch, MD;  Location: [facility];  Service: Ophthalmology;  Laterality: Left;  CDE=5.45  . CATARACT EXTRACTION W/PHACO  10/03/2012   Procedure: CATARACT EXTRACTION PHACO AND INTRAOCULAR LENS PLACEMENT (IOC);  Surgeon: Tonny Branch, MD;  Location: [facility];  Service: Ophthalmology;  Laterality: Right;  CDE: 12.31  . COLONOSCOPY  2011   Dr. Deatra Ina: colon polyps, tubular adenoma  . COLONOSCOPY N/A 11/01/2018   Dr. Gala Romney: Blood noted in the rectal vault.  Vascular pattern of the rectum abnormal, neovascular changes distally and actively oozing.  There were 3 2 to 5 mm polyps in the splenic flexure, cecum removed.  Cecal polyp was not recovered.  Radiation proctitis status post APC treatment.  The splenic flexure polyps were tubular adenomas.  Next colonoscopy in 5 years.  Marland Kitchen FLEXIBLE SIGMOIDOSCOPY N/A 10/27/2019   Procedure: FLEXIBLE SIGMOIDOSCOPY;  Surgeon: Danie Binder, MD;  [facility];  Service: Endoscopy;  Laterality: N/A;  3:15  . POLYPECTOMY  11/01/2018   Procedure: POLYPECTOMY;  Surgeon: Daneil Dolin, MD;  [facility];  Service: Endoscopy;;  cecum,splenic flexure    Home Medications:  Allergies as of 03/19/2020      Reactions   Zolpidem Tartrate Other (See Comments)   disorientation      Medication List       Accurate as of Mar 19, 2020  9:21 AM. If you have any questions, ask your nurse or doctor.        acetaminophen 325 MG tablet Commonly known as: TYLENOL Take 600 mg by mouth daily as needed for mild pain, moderate pain or headache.   atenolol 25 MG tablet Commonly known as:  TENORMIN TAKE 1 TABLET BY MOUTH EVERY DAY   atorvastatin 80 MG tablet Commonly known as: LIPITOR Take 1 tablet (80 mg total) by mouth daily.   B-D UF III MINI PEN NEEDLES 31G X 5 MM Misc Generic drug: Insulin Pen Needle USE FOUR TIMES DAILY AS DIRECTED   Calcium Plus Vitamin D3 600-500 MG-UNIT Caps Generic drug: Calcium Carb-Cholecalciferol Take 2 capsules by mouth daily.   clopidogrel 75 MG tablet Commonly known as: PLAVIX TAKE 1 TABLET BY MOUTH EVERY DAY   furosemide 40 MG tablet Commonly known as: LASIX TAKE 1 TABLET BY MOUTH EVERY DAY What changed:   when to take this  additional instructions   glucose blood test strip 1 each by Other route 2 (two) times daily. Use as instructed bid. E11.65 Contour next   Contour Test test strip Generic drug: glucose blood TEST TWICE DAILY E11.65   glucose blood test strip Commonly known as: Contour Next Test Use as instructed bid. E11.65   metFORMIN 500 MG tablet Commonly known as: GLUCOPHAGE TAKE 1 TABLET BY MOUTH TWICE A DAY   Myrbetriq 25 MG Tb24 tablet Generic drug: mirabegron ER Take 25 mg by mouth daily.   Semaglutide(0.25 or 0.5MG /DOS) 2 MG/1.5ML Sopn Inject 0.5 mg into the skin every Monday.   Tyler Aas FlexTouch 100 UNIT/ML FlexTouch Pen Generic drug: insulin degludec Inject 20 Units into the skin at bedtime.       Allergies:  Allergies  Allergen Reactions  . Zolpidem Tartrate Other (See Comments)    disorientation     Family History: Family History  Problem Relation Age of Onset  . Diabetes Mother   . Hypertension Mother   . Cancer Father        lung  . Cancer Sister        breast  . Sickle cell anemia Daughter   . Cancer Maternal Grandfather        prostate  . Colon cancer Neg Hx     Social History:  reports that he has never smoked. He has never used smokeless tobacco. He reports that he does not drink alcohol or use drugs.  ROS: All other review of systems were reviewed and are  negative except what is noted above in HPI  Physical Exam: BP 120/75   Pulse 83   Temp (!) 96.3 F (35.7 C)   Ht 6' (1.829 m)   Wt 242 lb (109.8 kg)   BMI 32.82 kg/m   Constitutional:  Alert and oriented, No acute distress. HEENT: [place] AT, moist mucus membranes.  Trachea midline, no masses. Cardiovascular: No clubbing, cyanosis, or edema. Respiratory: Normal respiratory effort, no increased work of breathing. GI: Abdomen is soft, nontender, nondistended, no abdominal masses GU: No CVA tenderness.  Lymph: No cervical or  inguinal lymphadenopathy. Skin: No rashes, bruises or suspicious lesions. Neurologic: Grossly intact, no focal deficits, moving all 4 extremities. Psychiatric: Normal mood and affect.  Laboratory Data: Lab Results  Component Value Date   WBC 5.2 03/16/2020   HGB 11.5 (L) 03/16/2020   HCT 35.5 (L) 03/16/2020   MCV 90.3 03/16/2020   PLT 252 03/16/2020    Lab Results  Component Value Date   CREATININE 1.61 (H) 02/24/2020    Lab Results  Component Value Date   PSA <0.1 03/16/2020   PSA 6.8 (H) 01/03/2017    Lab Results  Component Value Date   TESTOSTERONE 12 (L) 03/16/2020    Lab Results  Component Value Date   HGBA1C 6.0 (H) 11/26/2019    Urinalysis    Component Value Date/Time   COLORURINE STRAW (A) 03/21/2019 1500   APPEARANCEUR CLEAR 03/21/2019 1500   LABSPEC 1.009 03/21/2019 1500   PHURINE 6.0 03/21/2019 1500   GLUCOSEU NEGATIVE 03/21/2019 1500   HGBUR NEGATIVE 03/21/2019 1500   BILIRUBINUR NEGATIVE 03/21/2019 1500   BILIRUBINUR neg 10/27/2013 1421   KETONESUR NEGATIVE 03/21/2019 1500   PROTEINUR NEGATIVE 03/21/2019 1500   UROBILINOGEN 0.2 04/12/2015 0224   NITRITE NEGATIVE 03/21/2019 1500   LEUKOCYTESUR LARGE (A) 03/21/2019 1500    Lab Results  Component Value Date   MUCUS neg 10/27/2013   BACTERIA RARE (A) 03/21/2019    Pertinent Imaging:  No results found for this or any previous visit. No results found for this or any  previous visit. No results found for this or any previous visit. No results found for this or any previous visit. Results for orders placed during the hospital encounter of 10/31/13  US Renal   Narrative CLINICAL DATA:  Elevated serum creatinine. Hypertension and diabetes.  EXAM: RENAL/URINARY TRACT ULTRASOUND COMPLETE  COMPARISON:  08/22/2006  FINDINGS: Right Kidney: 10.1 cm. Slightly increased echogenicity and mild renal cortical thinning. No hydronephrosis.  Left Kidney: 10.5 cm. Slightly increased echogenicity with mild renal cortical thinning. No hydronephrosis.  Bladder:  Within normal limits.  Bilateral ureteric jets identified.  IMPRESSION: 1.  No hydronephrosis. 2. Findings of medical renal disease, including increased echogenicity and mildly decreased cortical thickness.   Electronically Signed   By: Abigail Miyamoto M.D.   On: 10/31/2013 15:06    No results found for this or any previous visit. No results found for this or any previous visit. No results found for this or any previous visit.  Assessment & Plan:    1. Prostate cancer ([place]) -Lupron 45mg  daily -RTC 6 months with PSA - leuprolide (6 Month) (ELIGARD) injection 45 mg  2. Urge incontinence -continue mirabegron 25mg  daily  3. Nocturia -continue mirabegron   No follow-ups on file.  Nicolette Bang, MD  [facility] [place]

## 2020-03-19 NOTE — Patient Instructions (Signed)
Prostate Cancer  The prostate is a male gland that helps make semen. Prostate cancer is when abnormal cells grow in this gland. Follow these instructions at home:  Take over-the-counter and prescription medicines only as told by your doctor.  Eat a healthy diet.  Get plenty of sleep.  Ask your doctor for help to find a support group for men with prostate cancer.  Keep all follow-up visits as told by your doctor. This is important.  If you have to go to the hospital, let your cancer doctor (oncologist) know.  Touch, hold, hug, and caress your partner to continue to show sexual feelings. Contact a doctor if:  You have trouble peeing (urinating).  You have blood in your pee (urine).  You have pain in your hips, back, or chest. Get help right away if:  You have weakness in your legs.  You lose feeling (have numbness) in your legs.  You cannot control your pee or your poop (stool).  You have trouble breathing.  You have sudden pain in your chest.  You have chills or a fever. Summary  The prostate is a male gland that helps make semen. Prostate cancer is when abnormal cells grow in this gland.  Ask your doctor for help to find a support group for men with prostate cancer.  Contact a doctor if you have problems peeing or have any new pain that you did not have before. This information is not intended to replace advice given to you by your health care provider. Make sure you discuss any questions you have with your health care provider. Document Revised: 09/28/2017 Document Reviewed: 06/26/2016 Elsevier Patient Education  2020 Elsevier Inc[employer]  

## 2020-03-26 ENCOUNTER — Ambulatory Visit: Payer: Self-pay | Admitting: Pharmacist

## 2020-03-26 NOTE — Chronic Care Management (AMB) (Signed)
  Chronic Care Management   Follow Up Note   03/26/2020 Name: ERCIL CASSIS MRN: [id number] DOB: 1951/09/30  Referred by: Alycia Rossetti, MD Reason for referral : No chief complaint on file.   DEREL MCGLASSON is a 69 y.o. year old male who is a primary care patient of [place], Modena Nunnery, MD. The CCM team was consulted for assistance with chronic disease management and care coordination needs.    Review of patient status, including review of consultants reports, relevant laboratory and other test results, and collaboration with appropriate care team members and the patient's provider was performed as part of comprehensive patient evaluation and provision of chronic care management services.    SDOH (Social Determinants of Health) assessments performed: No See Care Plan activities for detailed interventions related to [facility])     Outpatient Encounter Medications as of 03/26/2020  Medication Sig  . acetaminophen (TYLENOL) 325 MG tablet Take 600 mg by mouth daily as needed for mild pain, moderate pain or headache.   Marland Kitchen atenolol (TENORMIN) 25 MG tablet TAKE 1 TABLET BY MOUTH EVERY DAY  . atorvastatin (LIPITOR) 80 MG tablet Take 1 tablet (80 mg total) by mouth daily.  . B-D UF III MINI PEN NEEDLES 31G X 5 MM MISC USE FOUR TIMES DAILY AS DIRECTED  . Calcium Carb-Cholecalciferol (CALCIUM PLUS VITAMIN D3) 600-500 MG-UNIT CAPS Take 2 capsules by mouth daily.   . clopidogrel (PLAVIX) 75 MG tablet TAKE 1 TABLET BY MOUTH EVERY DAY  . CONTOUR TEST test strip TEST TWICE DAILY E11.65  . furosemide (LASIX) 40 MG tablet TAKE 1 TABLET BY MOUTH EVERY DAY (Patient taking differently: Take 40 mg by mouth. Taking Monday, Wednesday, friday)  . glucose blood (CONTOUR NEXT TEST) test strip Use as instructed bid. E11.65  . glucose blood test strip 1 each by Other route 2 (two) times daily. Use as instructed bid. E11.65 Contour next  . insulin degludec (TRESIBA FLEXTOUCH) 100 UNIT/ML SOPN FlexTouch Pen Inject 20 Units into  the skin at bedtime.  . metFORMIN (GLUCOPHAGE) 500 MG tablet TAKE 1 TABLET BY MOUTH TWICE A DAY  . mirabegron ER (MYRBETRIQ) 25 MG TB24 tablet Take 1 tablet (25 mg total) by mouth daily.  . Semaglutide 0.25 or 0.5 MG/DOSE SOPN Inject 0.5 mg into the skin every Monday.    No facility-administered encounter medications on file as of 03/26/2020.     Objective:  Reviewed PAP for Myrbetriq, Tyler Aas, Ozempic patient currently getting samples.    Plan:   Should be eligible pending income verification for [place] and Ozempic.  Will contact patient for proof of income to send off for approval to each program.   Beverly Milch, PharmD Clinical Pharmacist [place] [phone]

## 2020-03-31 ENCOUNTER — Encounter: Payer: Self-pay | Admitting: "Endocrinology

## 2020-03-31 ENCOUNTER — Ambulatory Visit: Payer: Medicare Other | Admitting: "Endocrinology

## 2020-03-31 ENCOUNTER — Other Ambulatory Visit: Payer: Self-pay

## 2020-03-31 ENCOUNTER — Ambulatory Visit (INDEPENDENT_AMBULATORY_CARE_PROVIDER_SITE_OTHER): Payer: Medicare Other | Admitting: "Endocrinology

## 2020-03-31 VITALS — BP 125/81 | HR 84 | Ht 72.0 in | Wt 242.6 lb

## 2020-03-31 DIAGNOSIS — Z794 Long term (current) use of insulin: Secondary | ICD-10-CM | POA: Diagnosis not present

## 2020-03-31 DIAGNOSIS — E1121 Type 2 diabetes mellitus with diabetic nephropathy: Secondary | ICD-10-CM

## 2020-03-31 DIAGNOSIS — E782 Mixed hyperlipidemia: Secondary | ICD-10-CM | POA: Diagnosis not present

## 2020-03-31 DIAGNOSIS — E1122 Type 2 diabetes mellitus with diabetic chronic kidney disease: Secondary | ICD-10-CM

## 2020-03-31 DIAGNOSIS — E1159 Type 2 diabetes mellitus with other circulatory complications: Secondary | ICD-10-CM | POA: Diagnosis not present

## 2020-03-31 DIAGNOSIS — N1832 Chronic kidney disease, stage 3b: Secondary | ICD-10-CM | POA: Diagnosis not present

## 2020-03-31 DIAGNOSIS — I10 Essential (primary) hypertension: Secondary | ICD-10-CM

## 2020-03-31 NOTE — Patient Instructions (Signed)

## 2020-03-31 NOTE — Progress Notes (Signed)
03/31/2020   Endocrinology follow-up note    Subjective:    Patient ID: [name], male    DOB: 07-16-1951, PCP Alycia Rossetti, MD   Past Medical History:  Diagnosis Date  . Arthritis   . Cerebrovascular disease    MRI shows Right carotid inferior cavernours narrowing 75% and  50-75% stenosis of Cavernous and supraclinoi right side  . CKD (chronic kidney disease) 06/28/2014   Sees Dr Florene Glen  . Diabetic retinopathy ([place])   . Hyperlipidemia   . Hypertension   . Myocardial infarction ([place])    "mild" heart attack  . Necrosis ([place])    #2 nail   . Pneumonia    as a child  . Poor circulation of extremity   . Prostate cancer ([place]) 2017   Prostate  . Sleep apnea    uses cpap, getting a new one  . Type 2 Diabetes mellitus    Type 2   Past Surgical History:  Procedure Laterality Date  . ABOVE KNEE LEG AMPUTATION Left 2004   started out below knee and then extended to above knee due to poor healing  . AMPUTATION Right 04/28/2015   Procedure: AMPUTATION RAY, RIGHT 5TH TOE;  Surgeon: Marybelle Killings, MD;  Location: [place];  Service: Orthopedics;  Laterality: Right;  . AMPUTATION Right 05/10/2015   Procedure: Right Below Knee Amputation;  Surgeon: Marybelle Killings, MD;  Location: [facility];  Service: Orthopedics;  Laterality: Right;  . CATARACT EXTRACTION W/PHACO  09/05/2012   Procedure: CATARACT EXTRACTION PHACO AND INTRAOCULAR LENS PLACEMENT (IOC);  Surgeon: Tonny Branch, MD;  [facility];  Service: Ophthalmology;  Laterality: Left;  CDE=5.45  . CATARACT EXTRACTION W/PHACO  10/03/2012   Procedure: CATARACT EXTRACTION PHACO AND INTRAOCULAR LENS PLACEMENT (IOC);  Surgeon: Tonny Branch, MD;  [facility]: [facility];  Service: Ophthalmology;  Laterality: Right;  CDE: 12.31  . COLONOSCOPY  2011   Dr. Deatra Ina: colon polyps, tubular adenoma  . COLONOSCOPY N/A 11/01/2018   Dr. Gala Romney: Blood noted in the rectal vault.  Vascular pattern of the rectum abnormal, neovascular changes distally and actively oozing.   There were 3 2 to 5 mm polyps in the splenic flexure, cecum removed.  Cecal polyp was not recovered.  Radiation proctitis status post APC treatment.  The splenic flexure polyps were tubular adenomas.  Next colonoscopy in 5 years.  Marland Kitchen FLEXIBLE SIGMOIDOSCOPY N/A 10/27/2019   Procedure: FLEXIBLE SIGMOIDOSCOPY;  Surgeon: Danie Binder, MD;  [facility];  Service: Endoscopy;  Laterality: N/A;  3:15  . POLYPECTOMY  11/01/2018   Procedure: POLYPECTOMY;  Surgeon: Daneil Dolin, MD;  [facility];  Service: Endoscopy;;  cecum,splenic flexure   Social History   Socioeconomic History  . Marital status: Married    Spouse name: Not on file  . Number of children: 2  . Years of education: college  . Highest education level: Not on file  Occupational History  . Occupation: Mining engineer: [employer]  Tobacco Use  . Smoking status: Never Smoker  . Smokeless tobacco: Never Used  Substance and Sexual Activity  . Alcohol use: No  . Drug use: No  . Sexual activity: Never  Other Topics Concern  . Not on file  Social History Narrative   Patient lives with his wife    Patient right handed   Patient drinks caffine on occ.   Social Determinants of Health   Financial Resource Strain:   .  Difficulty of Paying Living Expenses:   Food Insecurity:   . Worried About Charity fundraiser in the Last Year:   . Arboriculturist in the Last Year:   Transportation Needs:   . Film/video editor (Medical):   Marland Kitchen Lack of Transportation (Non-Medical):   Physical Activity:   . Days of Exercise per Week:   . Minutes of Exercise per Session:   Stress:   . Feeling of Stress :   Social Connections:   . Frequency of Communication with Friends and Family:   . Frequency of Social Gatherings with Friends and Family:   . Attends Religious Services:   . Active Member of Clubs or Organizations:   . Attends Archivist Meetings:   Marland Kitchen Marital Status:    Outpatient  Encounter Medications as of 03/31/2020  Medication Sig  . acetaminophen (TYLENOL) 325 MG tablet Take 600 mg by mouth daily as needed for mild pain, moderate pain or headache.   Marland Kitchen atenolol (TENORMIN) 25 MG tablet TAKE 1 TABLET BY MOUTH EVERY DAY  . atorvastatin (LIPITOR) 80 MG tablet Take 1 tablet (80 mg total) by mouth daily.  . B-D UF III MINI PEN NEEDLES 31G X 5 MM MISC USE FOUR TIMES DAILY AS DIRECTED  . Calcium Carb-Cholecalciferol (CALCIUM PLUS VITAMIN D3) 600-500 MG-UNIT CAPS Take 2 capsules by mouth daily.   . clopidogrel (PLAVIX) 75 MG tablet TAKE 1 TABLET BY MOUTH EVERY DAY  . CONTOUR TEST test strip TEST TWICE DAILY E11.65  . furosemide (LASIX) 40 MG tablet TAKE 1 TABLET BY MOUTH EVERY DAY (Patient taking differently: Take 40 mg by mouth. Taking Monday, Wednesday, friday)  . glucose blood (CONTOUR NEXT TEST) test strip Use as instructed bid. E11.65  . glucose blood test strip 1 each by Other route 2 (two) times daily. Use as instructed bid. E11.65 Contour next  . insulin degludec (TRESIBA FLEXTOUCH) 100 UNIT/ML SOPN FlexTouch Pen Inject 20 Units into the skin at bedtime.  . mirabegron ER (MYRBETRIQ) 25 MG TB24 tablet Take 1 tablet (25 mg total) by mouth daily.  . Semaglutide 0.25 or 0.5 MG/DOSE SOPN Inject 0.5 mg into the skin every Monday.   . [DISCONTINUED] metFORMIN (GLUCOPHAGE) 500 MG tablet TAKE 1 TABLET BY MOUTH TWICE A DAY   No facility-administered encounter medications on file as of 03/31/2020.   ALLERGIES: Allergies  Allergen Reactions  . Zolpidem Tartrate Other (See Comments)    disorientation    VACCINATION STATUS: Immunization History  Administered Date(s) Administered  . Fluad Quad(high Dose 65+) 07/01/2019  . Influenza Split 06/30/2013, 08/30/2013  . Influenza-Unspecified 07/30/2015, 06/30/2018  . Moderna SARS-COVID-2 Vaccination 11/20/2019, 12/17/2019  . Pneumococcal Conjugate-13 01/08/2018  . Pneumococcal Polysaccharide-23 07/31/2007, 08/26/2019  . Td  10/30/2002  . Tdap 06/19/2016  . Zoster 06/19/2016    Diabetes He presents for his follow-up diabetic visit. He has type 2 diabetes mellitus. Onset time: He was diagnosed at approximate age of 34 years. His disease course has been stable. There are no hypoglycemic associated symptoms. Pertinent negatives for hypoglycemia include no confusion, pallor or seizures. There are no diabetic associated symptoms. Pertinent negatives for diabetes include no fatigue, no polydipsia, no polyphagia, no polyuria and no weakness. There are no hypoglycemic complications. Symptoms are stable. Diabetic complications include nephropathy. (Status post bilateral lower extremity amputations. He is wheelchair-bound, awaiting for leg prosthetics.) Risk factors for coronary artery disease include diabetes mellitus, dyslipidemia, hypertension, male sex, obesity, sedentary lifestyle and tobacco exposure. Current diabetic  treatment includes insulin injections (He could not afford [place], [place], due to a gap in his insurance, currently on the on metformin 500 minute grams by mouth twice a day.). He is compliant with treatment most of the time. His weight is fluctuating minimally. He is following a diabetic diet. When asked about meal planning, he reported none. He has had a previous visit with a dietitian. He never participates in exercise. His home blood glucose trend is fluctuating minimally. His breakfast blood glucose range is generally 110-130 mg/dl. His overall blood glucose range is 110-130 mg/dl. (He presents with controlled glycemic profile averaging 125.  Point-of-care A1c is 6%, unchanged from his last time A1c.  He denies hypoglycemia.  ) An ACE inhibitor/angiotensin II receptor blocker is being taken.  Hyperlipidemia This is a chronic problem. The current episode started more than 1 year ago. The problem is controlled. Pertinent negatives include no myalgias. Current antihyperlipidemic treatment includes statins. Risk  factors for coronary artery disease include dyslipidemia, diabetes mellitus, hypertension, male sex, obesity and a sedentary lifestyle.    Review of systems  Constitutional: + Minimally fluctuating body weight,  current  Body mass index is 32.9 kg/m. , no fatigue, no subjective hyperthermia, no subjective hypothermia Eyes: no blurry vision, no xerophthalmia ENT: no sore throat, no nodules palpated in throat, no dysphagia/odynophagia, no hoarseness Cardiovascular: no Chest Pain, no Shortness of Breath, no palpitations, no leg swelling Respiratory: no cough, no shortness of breath Gastrointestinal: no Nausea/Vomiting/Diarhhea Musculoskeletal: + Bilateral lower extremity amputations, with prosthetic legs, walks with a walker.    Skin: no rashes, no hyperemia Neurological: no tremors, no numbness, no tingling, no dizziness Psychiatric: no depression, no anxiety     Objective:    BP 125/81   Pulse 84   Ht 6' (1.829 m)   Wt 242 lb 9.6 oz (110 kg)   BMI 32.90 kg/m   Wt Readings from Last 3 Encounters:  03/31/20 242 lb 9.6 oz (110 kg)  03/19/20 242 lb (109.8 kg)  02/24/20 249 lb (112.9 kg)     Physical Exam- Limited  Constitutional:  Body mass index is 32.9 kg/m. , not in acute distress, normal state of mind Eyes:  EOMI, no exophthalmos Neck: Supple Thyroid: No gross goiter Respiratory: Adequate breathing efforts Musculoskeletal: + Bilateral lower extremity amputations, with prosthetic legs, walks with a walker.   Skin:  no rashes, no hyperemia Neurological: no tremor with outstretched hands,    Results for orders placed or performed in visit on 03/16/20  Testosterone  Result Value Ref Range   Testosterone 12 (L) 250 - 827 ng/dL  PSA  Result Value Ref Range   PSA <0.1 < OR = 4.0 ng/mL   Complete Blood Count (Most recent): Lab Results  Component Value Date   WBC 5.2 03/16/2020   HGB 11.5 (L) 03/16/2020   HCT 35.5 (L) 03/16/2020   MCV 90.3 03/16/2020   PLT 252  03/16/2020   Chemistry (most recent): Lab Results  Component Value Date   NA 142 02/24/2020   K 4.2 02/24/2020   CL 105 02/24/2020   CO2 24 02/24/2020   BUN 34 (H) 02/24/2020   CREATININE 1.61 (H) 02/24/2020   Diabetic Labs (most recent): Lab Results  Component Value Date   HGBA1C 6.0 (H) 11/26/2019   HGBA1C 6.1 (H) 08/26/2019   HGBA1C 6.0 (H) 05/20/2019   Lipid Panel     Component Value Date/Time   CHOL 97 02/24/2020 0915   CHOL 158 02/23/2015 0931  TRIG 74 02/24/2020 0915   TRIG 98 10/27/2013 1305   HDL 32 (L) 02/24/2020 0915   HDL 55 02/23/2015 0931   HDL 39 (L) 10/27/2013 1305   CHOLHDL 3.0 02/24/2020 0915   VLDL 14 10/31/2016 1137   LDLCALC 50 02/24/2020 0915   LDLCALC 82 10/27/2013 1305    Assessment & Plan:   1. Type 2 diabetes mellitus with other circulatory complications (HCC)  -His  diabetes is  complicated by extensive peripheral arterial disease with bilateral lower extremity amputations and patient remains at a high risk for more acute and chronic complications of diabetes which include CAD, CVA, CKD, retinopathy, and neuropathy. These are all discussed in detail with the patient.  He presents with controlled glycemic profile averaging 125.  Point-of-care A1c is 6%, unchanged from his last time A1c.  He denies hypoglycemia.     Recent labs reviewed, showing stage stable  3 renal insufficiency.   - he  admits there is a room for improvement in his diet and drink choices. -  Suggestion is made for him to avoid simple carbohydrates  from his diet including Cakes, Sweet Desserts / Pastries, Ice Cream, Soda (diet and regular), Sweet Tea, Candies, Chips, Cookies, Sweet Pastries,  Store Bought Juices, Alcohol in Excess of  1-2 drinks a day, Artificial Sweeteners, Coffee Creamer, and "Sugar-free" Products. This will help patient to have stable blood glucose profile and potentially avoid unintended weight gain.   - Patient is advised to stick to a routine  mealtimes to eat 3 meals  a day and avoid unnecessary snacks ( to snack only to correct hypoglycemia).   - I have approached patient with the following individualized plan to manage diabetes and patient agrees.  -He is advised to continue Tresiba 20 units nightly, samples were given to him from clinic, associated with monitoring of blood glucose at least once daily before breakfast.    -Due to declining renal function, he is advised to discontinue Metformin for now.  He is advised to continue Ozempic 0.5 mg subcutaneously weekly (samples from clinic).   - His insurance did not provide coverage for Ozempic nor Trulicity.   2) hypertension: His blood pressure is controlled to target. -He is advised to continue current medications including atenolol 25 mg p.o. daily, Lasix as needed.   3) Lipids/HPL: His recent lipid panel showed controlled LDL at 52.  He is advised to continue atorvastatin 80 mg p.o. nightly.     Side effects and precautions discussed with him.     - I advised patient to maintain close follow up with Alycia Rossetti, MD for primary care needs.  - Time spent on this patient care encounter:  35 min, of which > 50% was spent in  counseling and the rest reviewing his blood glucose logs , discussing his hypoglycemia and hyperglycemia episodes, reviewing his current and  previous labs / studies  ( including abstraction from other facilities) and medications  doses and developing a  long term treatment plan and documenting his care.   Please refer to Patient Instructions for Blood Glucose Monitoring and Insulin/Medications Dosing Guide"  in media tab for additional information. Please  also refer to " Patient Self Inventory" in the Media  tab for reviewed elements of pertinent patient history.  Ozella Almond Gerdts participated in the discussions, expressed understanding, and voiced agreement with the above plans.  All questions were answered to his satisfaction. he is encouraged to contact  clinic should he have any questions  or concerns prior to his return visit.    Follow up plan: -Return in about 4 months (around 07/31/2020) for NV Office Urine MA, NV A1c in Office, F/U with Pre-visit Labs.  Glade Lloyd, MD Phone: [phone]  Fax: [phone]  -  This note was partially dictated with voice recognition software. Similar sounding words can be transcribed inadequately or may not  be corrected upon review.  03/31/2020, 8:46 AM

## 2020-04-06 ENCOUNTER — Telehealth: Payer: Self-pay | Admitting: *Deleted

## 2020-04-06 NOTE — Telephone Encounter (Signed)
Noted addended.   Order faxed.

## 2020-04-06 NOTE — Telephone Encounter (Signed)
Order written

## 2020-04-06 NOTE — Telephone Encounter (Signed)
Received call from patient.   Requested order to be faxed to [employer] for BLE Stump Sleeves. States that sleeves are used when he is at home without prosthetics to keep stumps from swelling.   Reports that he was also advised that for insurance to cover, addendum will need to be made to last chart note that states why he needs sleeves.   [employer], [place]~ [phone] telephone/ [phone]~ fax- Attn: Fritz Pickerel

## 2020-04-25 NOTE — Progress Notes (Signed)
Referring Provider: Alycia Rossetti, MD Primary Care Physician:  Alycia Rossetti, MD Primary GI Physician: Dr. Oneida Alar (Dr. Gala Romney following for now)  Chief Complaint  Patient presents with  . Rectal Bleeding    occ but getting better; pp f/u  . Anemia    HPI:   [name] is a 69 y.o. male with a history of radiation proctitis, rectal bleeding, and adenomatous colon polyps. He has flex sig with APC treatment of radiation proctitis on 10/27/19 (actively bleeding during exam). He had APC at time of colonoscopy in 10/2018 as well. Next TCS due in 2025. He presents today for follow-up of rectal bleeding/radiation proctitis.   Last seen via virtual visit 11/03/2019.  Patient felt rectal bleeding was significantly better since APC treatment in December 2020. Noted history of anemia that had been stable over the last 18 months.  He was advised to continue to monitor his rectal bleeding and let us know of any worsening symptoms as we could try addition of topical therapy.  Plan to return to the office in June 2021.  Labs in January 2021 with hemoglobin 11.5 (consistent with baseline).  Recommended iron, TIBC, ferritin to rule out IDA, and repeat CBC in 8 weeks.  Overall, suspected anemia of chronic disease.  Labs 03/16/2020: Hemoglobin 11.5, normocytic indices, iron 56, percent saturation 24%, ferritin 126.  Again, findings most consistent with anemia of chronic disease, likely related to kidney disease.  Today: Rectal bleeding continues to get better and better.  Occurring maybe once every two weeks.  Small amount on toilet tissue. No rectal pain. BMs daily. No constipation or diarrhea. No melena. Takes Tylenol as needed. No NSAIDs.   No other obvious bleeding (nosebleeds or hematuria). No abdominal pain, nausea, vomiting, GERD symptoms, or dysphagia.   Past Medical History:  Diagnosis Date  . Arthritis   . Cerebrovascular disease    MRI shows Right carotid inferior cavernours narrowing  75% and  50-75% stenosis of Cavernous and supraclinoi right side  . CKD (chronic kidney disease) 06/28/2014   Sees Dr Florene Glen  . Diabetic retinopathy ([place])   . Hyperlipidemia   . Hypertension   . Myocardial infarction ([place])    "mild" heart attack  . Necrosis ([place])    #2 nail   . Pneumonia    as a child  . Poor circulation of extremity   . Prostate cancer ([place]) 2017   Prostate  . Sleep apnea    uses cpap, getting a new one  . Type 2 Diabetes mellitus    Type 2    Past Surgical History:  Procedure Laterality Date  . ABOVE KNEE LEG AMPUTATION Left 2004   started out below knee and then extended to above knee due to poor healing  . AMPUTATION Right 04/28/2015   Procedure: AMPUTATION RAY, RIGHT 5TH TOE;  Surgeon: Marybelle Killings, MD;  Location: [facility];  Service: Orthopedics;  Laterality: Right;  . AMPUTATION Right 05/10/2015   Procedure: Right Below Knee Amputation;  Surgeon: Marybelle Killings, MD;  Location: [facility];  Service: Orthopedics;  Laterality: Right;  . CATARACT EXTRACTION W/PHACO  09/05/2012   Procedure: CATARACT EXTRACTION PHACO AND INTRAOCULAR LENS PLACEMENT (IOC);  Surgeon: Tonny Branch, MD;  [facility];  Service: Ophthalmology;  Laterality: Left;  CDE=5.45  . CATARACT EXTRACTION W/PHACO  10/03/2012   Procedure: CATARACT EXTRACTION PHACO AND INTRAOCULAR LENS PLACEMENT (IOC);  Surgeon: Tonny Branch, MD;  [facility];  Service: Ophthalmology;  Laterality:  Right;  CDE: 12.31  . COLONOSCOPY  2011   Dr. Deatra Ina: colon polyps, tubular adenoma  . COLONOSCOPY N/A 11/01/2018   Dr. Gala Romney: Blood noted in the rectal vault.  Vascular pattern of the rectum abnormal, neovascular changes distally and actively oozing.  There were 3 2 to 5 mm polyps in the splenic flexure, cecum removed.  Cecal polyp was not recovered.  Radiation proctitis status post APC treatment.  The splenic flexure polyps were tubular adenomas.  Next colonoscopy in 5 years.  Marland Kitchen FLEXIBLE SIGMOIDOSCOPY N/A 10/27/2019   Procedure:  FLEXIBLE SIGMOIDOSCOPY;  Surgeon: Danie Binder, MD; rectal bleeding due to radiation proctitis s/p APC therapy (actively bleeding during exam), rectosigmoid colon and sigmoid colon appeared normal.  . POLYPECTOMY  11/01/2018   Procedure: POLYPECTOMY;  Surgeon: Daneil Dolin, MD;  [facility];  Service: Endoscopy;;  cecum,splenic flexure    Current Outpatient Medications  Medication Sig Dispense Refill  . acetaminophen (TYLENOL) 325 MG tablet Take 600 mg by mouth daily as needed for mild pain, moderate pain or headache.     Marland Kitchen atenolol (TENORMIN) 25 MG tablet TAKE 1 TABLET BY MOUTH EVERY DAY 90 tablet 1  . atorvastatin (LIPITOR) 80 MG tablet Take 1 tablet (80 mg total) by mouth daily. 90 tablet 1  . B-D UF III MINI PEN NEEDLES 31G X 5 MM MISC USE FOUR TIMES DAILY AS DIRECTED 150 each 2  . Calcium Carb-Cholecalciferol (CALCIUM PLUS VITAMIN D3) 600-500 MG-UNIT CAPS Take 2 capsules by mouth daily.     . clopidogrel (PLAVIX) 75 MG tablet TAKE 1 TABLET BY MOUTH EVERY DAY 90 tablet 1  . CONTOUR TEST test strip TEST TWICE DAILY E11.65 200 each 2  . furosemide (LASIX) 40 MG tablet TAKE 1 TABLET BY MOUTH EVERY DAY (Patient taking differently: Take 40 mg by mouth. Taking Monday, Wednesday, friday) 90 tablet 0  . glucose blood (CONTOUR NEXT TEST) test strip Use as instructed bid. E11.65 100 each 5  . glucose blood test strip 1 each by Other route 2 (two) times daily. Use as instructed bid. E11.65 Contour next 300 each 2  . insulin degludec (TRESIBA FLEXTOUCH) 100 UNIT/ML SOPN FlexTouch Pen Inject 20 Units into the skin at bedtime.    . mirabegron ER (MYRBETRIQ) 25 MG TB24 tablet Take 1 tablet (25 mg total) by mouth daily. (Patient taking differently: Take 25 mg by mouth every other day. ) 30 tablet 0  . Semaglutide 0.25 or 0.5 MG/DOSE SOPN Inject 0.5 mg into the skin every Monday.      No current facility-administered medications for this visit.    Allergies as of 04/26/2020 - Review  Complete 04/26/2020  Allergen Reaction Noted  . Zolpidem tartrate Other (See Comments) 04/11/2011    Family History  Problem Relation Age of Onset  . Diabetes Mother   . Hypertension Mother   . Cancer Father        lung  . Cancer Sister        breast  . Sickle cell anemia Daughter   . Cancer Maternal Grandfather        prostate  . Colon cancer Neg Hx     Social History   Socioeconomic History  . Marital status: Married    Spouse name: Not on file  . Number of children: 2  . Years of education: college  . Highest education level: Not on file  Occupational History  . 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 Smoking status: Never Smoker  . Smokeless tobacco: Never Used  Vaping Use  . Vaping Use: Never used  Substance and Sexual Activity  . Alcohol use: No  . Drug use: No  . Sexual activity: Never  Other Topics Concern  . Not on file  Social History Narrative   Patient lives with his wife    Patient right handed   Patient drinks caffine on occ.   Social Determinants of Health   Financial Resource Strain:   . Difficulty of Paying Living Expenses:   Food Insecurity:   . Worried About Charity fundraiser in the Last Year:   . Arboriculturist in the Last Year:   Transportation Needs:   . Film/video editor (Medical):   Marland Kitchen Lack of Transportation (Non-Medical):   Physical Activity:   . Days of Exercise per Week:   . Minutes of Exercise per Session:   Stress:   . Feeling of Stress :   Social Connections:   . Frequency of Communication with Friends and Family:   . Frequency of Social Gatherings with Friends and Family:   . Attends Religious Services:   . Active Member of Clubs or Organizations:   . Attends Archivist Meetings:   Marland Kitchen Marital Status:     Review of Systems: Gen: Denies fever, chills, cold or flulike symptoms, presyncope, syncope. Hot flashes x 1 month when receiving testosterone shot.  CV: Denies  chest pain or palpitations Resp: Denies dyspnea at rest or cough GI: See HPI Derm: Denies rash Heme: Denies bruising or bleeding  Physical Exam: BP 123/72   Pulse 95   Temp 97.6 F (36.4 C) (Oral)   Ht 6' (1.829 m)   Wt 242 lb 6.4 oz (110 kg)   BMI 32.88 kg/m  General:   Alert and oriented. No distress noted. Pleasant and cooperative.  Head:  Normocephalic and atraumatic. Eyes:  Conjuctiva clear without scleral icterus.  Heart:  S1, S2 present without murmurs appreciated. Lungs:  Clear to auscultation bilaterally. No wheezes, rales, or rhonchi. No distress.  Abdomen:  +BS, soft, non-tender and non-distended. No rebound or guarding. No HSM or masses noted. Msk: Lower extremity prosthesis in place bilaterally. Neurologic:  Alert and  oriented x4 Psych: Normal mood and affect.

## 2020-04-26 ENCOUNTER — Ambulatory Visit (INDEPENDENT_AMBULATORY_CARE_PROVIDER_SITE_OTHER): Payer: Medicare Other | Admitting: Gastroenterology

## 2020-04-26 ENCOUNTER — Encounter: Payer: Self-pay | Admitting: Gastroenterology

## 2020-04-26 ENCOUNTER — Other Ambulatory Visit: Payer: Self-pay

## 2020-04-26 VITALS — BP 123/72 | HR 95 | Temp 97.6°F | Ht 72.0 in | Wt 242.4 lb

## 2020-04-26 DIAGNOSIS — K625 Hemorrhage of anus and rectum: Secondary | ICD-10-CM | POA: Diagnosis not present

## 2020-04-26 DIAGNOSIS — K627 Radiation proctitis: Secondary | ICD-10-CM | POA: Diagnosis not present

## 2020-04-26 DIAGNOSIS — D649 Anemia, unspecified: Secondary | ICD-10-CM

## 2020-04-26 NOTE — Progress Notes (Signed)
Cc'ed to pcp °

## 2020-04-26 NOTE — Assessment & Plan Note (Signed)
Addressed under rectal bleeding 

## 2020-04-26 NOTE — Assessment & Plan Note (Addendum)
Chronic history of mild normocytic anemia dating back at least to 2013.  Iron panel within normal limits on 03/16/2020 with hemoglobin stable at 11.5.  History of rectal bleeding in setting of radiation proctitis that has significantly improved with APC therapy in December 2020.  Occasional toilet tissue hematochezia occurring once every couple of weeks. TCS up to date in January 2020 and due for repeat in 2025. No other signicicant lower GI symptoms and no significant upper GI symptoms.  No NSAIDs.  He does have history of CKD and I suspect his anemia is likely secondary to chronic disease.  No indications for additional GI work-up at this time.

## 2020-04-26 NOTE — Patient Instructions (Addendum)
Continue monitoring your rectal bleeding. If symptoms worsen/become more frequent, please let us know.   We will plan to see you back in 6 months. Do not hesitate to call with questions or concerns prior.   Aliene Altes, PA-C [facility]

## 2020-04-26 NOTE — Assessment & Plan Note (Addendum)
Rectal bleeding secondary to radiation proctitis.  APC therapy in January 2020 as well as December 2020.  He has had significant improvement in rectal bleeding since APC therapy.  Now only with low volume toilet tissue hematochezia once every couple of weeks. Overall, patient feels symptoms continue to improve with time.  No other significant upper or lower GI symptoms.  He is due for repeat colonoscopy in 2025.  Advised to continue to monitor rectal bleeding and let us know of any worsening symptoms.  Otherwise, we will plan to follow-up in 6 months.

## 2020-04-30 ENCOUNTER — Other Ambulatory Visit: Payer: Self-pay | Admitting: "Endocrinology

## 2020-05-08 ENCOUNTER — Other Ambulatory Visit: Payer: Self-pay | Admitting: Family Medicine

## 2020-06-11 ENCOUNTER — Other Ambulatory Visit: Payer: Self-pay | Admitting: Family Medicine

## 2020-06-29 DIAGNOSIS — N183 Chronic kidney disease, stage 3 unspecified: Secondary | ICD-10-CM | POA: Diagnosis not present

## 2020-06-29 LAB — BASIC METABOLIC PANEL
BUN: 22 — AB (ref 4–21)
CO2: 22 (ref 13–22)
Chloride: 106 (ref 99–108)
Creatinine: 1.6 — AB (ref 0.6–1.3)
Glucose: 140
Potassium: 4.4 (ref 3.4–5.3)
Sodium: 143 (ref 137–147)

## 2020-06-29 LAB — COMPREHENSIVE METABOLIC PANEL
Albumin: 3.8 (ref 3.5–5.0)
Calcium: 9.2 (ref 8.7–10.7)
GFR calc Af Amer: 50
GFR calc non Af Amer: 44

## 2020-07-06 DIAGNOSIS — C61 Malignant neoplasm of prostate: Secondary | ICD-10-CM | POA: Diagnosis not present

## 2020-07-06 DIAGNOSIS — N1831 Chronic kidney disease, stage 3a: Secondary | ICD-10-CM | POA: Diagnosis not present

## 2020-07-06 DIAGNOSIS — E1122 Type 2 diabetes mellitus with diabetic chronic kidney disease: Secondary | ICD-10-CM | POA: Diagnosis not present

## 2020-07-06 DIAGNOSIS — I129 Hypertensive chronic kidney disease with stage 1 through stage 4 chronic kidney disease, or unspecified chronic kidney disease: Secondary | ICD-10-CM | POA: Diagnosis not present

## 2020-07-06 DIAGNOSIS — Z89511 Acquired absence of right leg below knee: Secondary | ICD-10-CM | POA: Diagnosis not present

## 2020-07-24 ENCOUNTER — Other Ambulatory Visit: Payer: Self-pay | Admitting: Family Medicine

## 2020-07-27 ENCOUNTER — Telehealth: Payer: Self-pay

## 2020-07-27 ENCOUNTER — Other Ambulatory Visit: Payer: Self-pay

## 2020-07-27 DIAGNOSIS — E1159 Type 2 diabetes mellitus with other circulatory complications: Secondary | ICD-10-CM

## 2020-07-27 NOTE — Telephone Encounter (Signed)
UPDATED

## 2020-07-27 NOTE — Telephone Encounter (Signed)
Can you check and see if this patient is up to date on labs under [facility] for appt next week? [phone]

## 2020-08-03 ENCOUNTER — Ambulatory Visit: Payer: Medicare Other | Admitting: Nurse Practitioner

## 2020-08-06 ENCOUNTER — Ambulatory Visit (INDEPENDENT_AMBULATORY_CARE_PROVIDER_SITE_OTHER): Payer: Medicare Other | Admitting: Nurse Practitioner

## 2020-08-06 ENCOUNTER — Encounter: Payer: Self-pay | Admitting: Nurse Practitioner

## 2020-08-06 ENCOUNTER — Other Ambulatory Visit: Payer: Self-pay

## 2020-08-06 VITALS — BP 129/77 | HR 83 | Ht 72.0 in | Wt 251.2 lb

## 2020-08-06 DIAGNOSIS — E782 Mixed hyperlipidemia: Secondary | ICD-10-CM | POA: Diagnosis not present

## 2020-08-06 DIAGNOSIS — I10 Essential (primary) hypertension: Secondary | ICD-10-CM | POA: Diagnosis not present

## 2020-08-06 DIAGNOSIS — E1159 Type 2 diabetes mellitus with other circulatory complications: Secondary | ICD-10-CM

## 2020-08-06 DIAGNOSIS — N1832 Chronic kidney disease, stage 3b: Secondary | ICD-10-CM

## 2020-08-06 DIAGNOSIS — Z794 Long term (current) use of insulin: Secondary | ICD-10-CM | POA: Diagnosis not present

## 2020-08-06 DIAGNOSIS — E559 Vitamin D deficiency, unspecified: Secondary | ICD-10-CM

## 2020-08-06 DIAGNOSIS — E1122 Type 2 diabetes mellitus with diabetic chronic kidney disease: Secondary | ICD-10-CM | POA: Diagnosis not present

## 2020-08-06 LAB — POCT UA - MICROALBUMIN: Microalbumin Ur, POC: 30 mg/L

## 2020-08-06 LAB — POCT GLYCOSYLATED HEMOGLOBIN (HGB A1C): Hemoglobin A1C: 6.7 % — AB (ref 4.0–5.6)

## 2020-08-06 NOTE — Patient Instructions (Signed)

## 2020-08-06 NOTE — Progress Notes (Signed)
08/06/2020   Endocrinology follow-up note    Subjective:    Patient ID: [name], male    DOB: 1951-07-15, PCP Alycia Rossetti, MD   Past Medical History:  Diagnosis Date  . Arthritis   . Cerebrovascular disease    MRI shows Right carotid inferior cavernours narrowing 75% and  50-75% stenosis of Cavernous and supraclinoi right side  . CKD (chronic kidney disease) 06/28/2014   Sees Dr Florene Glen  . Diabetic retinopathy ([place])   . Hyperlipidemia   . Hypertension   . Myocardial infarction ([place])    "mild" heart attack  . Necrosis ([place])    #2 nail   . Pneumonia    as a child  . Poor circulation of extremity   . Prostate cancer ([place]) 2017   Prostate  . Sleep apnea    uses cpap, getting a new one  . Type 2 Diabetes mellitus    Type 2   Past Surgical History:  Procedure Laterality Date  . ABOVE KNEE LEG AMPUTATION Left 2004   started out below knee and then extended to above knee due to poor healing  . AMPUTATION Right 04/28/2015   Procedure: AMPUTATION RAY, RIGHT 5TH TOE;  Surgeon: Marybelle Killings, MD;  Location: [place];  Service: Orthopedics;  Laterality: Right;  . AMPUTATION Right 05/10/2015   Procedure: Right Below Knee Amputation;  Surgeon: Marybelle Killings, MD;  Location: [place];  Service: Orthopedics;  Laterality: Right;  . CATARACT EXTRACTION W/PHACO  09/05/2012   Procedure: CATARACT EXTRACTION PHACO AND INTRAOCULAR LENS PLACEMENT (IOC);  Surgeon: Tonny Branch, MD;  Location: [facility];  Service: Ophthalmology;  Laterality: Left;  CDE=5.45  . CATARACT EXTRACTION W/PHACO  10/03/2012   Procedure: CATARACT EXTRACTION PHACO AND INTRAOCULAR LENS PLACEMENT (IOC);  Surgeon: Tonny Branch, MD;  Location: [facility];  Service: Ophthalmology;  Laterality: Right;  CDE: 12.31  . COLONOSCOPY  2011   Dr. Deatra Ina: colon polyps, tubular adenoma  . COLONOSCOPY N/A 11/01/2018   Dr. Gala Romney: Blood noted in the rectal vault.  Vascular pattern of the rectum abnormal, neovascular changes distally and actively oozing.   There were 3 2 to 5 mm polyps in the splenic flexure, cecum removed.  Cecal polyp was not recovered.  Radiation proctitis status post APC treatment.  The splenic flexure polyps were tubular adenomas.  Next colonoscopy in 5 years.  Marland Kitchen FLEXIBLE SIGMOIDOSCOPY N/A 10/27/2019   Procedure: FLEXIBLE SIGMOIDOSCOPY;  Surgeon: Danie Binder, MD; rectal bleeding due to radiation proctitis s/p APC therapy (actively bleeding during exam), rectosigmoid colon and sigmoid colon appeared normal.  . POLYPECTOMY  11/01/2018   Procedure: POLYPECTOMY;  Surgeon: Daneil Dolin, MD;  [facility];  Service: Endoscopy;;  cecum,splenic flexure   Social History   Socioeconomic History  . Marital status: Married    Spouse name: Not on file  . Number of children: 2  . Years of education: college  . Highest education level: Not on file  Occupational History  . Occupation: Mining engineer: [employer]  Tobacco Use  . Smoking status: Never Smoker  . Smokeless tobacco: Never Used  Vaping Use  . Vaping Use: Never used  Substance and Sexual Activity  . Alcohol use: No  . Drug use: No  . Sexual activity: Never  Other Topics Concern  . Not on file  Social History Narrative   Patient lives with his wife    Patient right handed   Patient drinks  caffine on occ.   Social Determinants of Health   Financial Resource Strain:   . Difficulty of Paying Living Expenses: Not on file  Food Insecurity:   . Worried About Charity fundraiser in the Last Year: Not on file  . Ran Out of Food in the Last Year: Not on file  Transportation Needs:   . Lack of Transportation (Medical): Not on file  . Lack of Transportation (Non-Medical): Not on file  Physical Activity:   . Days of Exercise per Week: Not on file  . Minutes of Exercise per Session: Not on file  Stress:   . Feeling of Stress : Not on file  Social Connections:   . Frequency of Communication with Friends and Family: Not on file  .  Frequency of Social Gatherings with Friends and Family: Not on file  . Attends Religious Services: Not on file  . Active Member of Clubs or Organizations: Not on file  . Attends Archivist Meetings: Not on file  . Marital Status: Not on file   Outpatient Encounter Medications as of 08/06/2020  Medication Sig  . acetaminophen (TYLENOL) 325 MG tablet Take 600 mg by mouth daily as needed for mild pain, moderate pain or headache.   Marland Kitchen atenolol (TENORMIN) 25 MG tablet TAKE 1 TABLET BY MOUTH EVERY DAY  . atorvastatin (LIPITOR) 80 MG tablet TAKE 1 TABLET BY MOUTH EVERY DAY  . B-D UF III MINI PEN NEEDLES 31G X 5 MM MISC USE FOUR TIMES DAILY AS DIRECTED  . Calcium Carb-Cholecalciferol (CALCIUM PLUS VITAMIN D3) 600-500 MG-UNIT CAPS Take 2 capsules by mouth daily.   . clopidogrel (PLAVIX) 75 MG tablet TAKE 1 TABLET BY MOUTH EVERY DAY  . CONTOUR TEST test strip TEST TWICE DAILY E11.65  . furosemide (LASIX) 20 MG tablet Take 20 mg by mouth 3 (three) times a week.  Marland Kitchen glucose blood (CONTOUR NEXT TEST) test strip Use as instructed bid. E11.65  . glucose blood test strip 1 each by Other route 2 (two) times daily. Use as instructed bid. E11.65 Contour next  . insulin degludec (TRESIBA FLEXTOUCH) 100 UNIT/ML SOPN FlexTouch Pen Inject 20 Units into the skin at bedtime.  . mirabegron ER (MYRBETRIQ) 25 MG TB24 tablet Take 1 tablet (25 mg total) by mouth daily. (Patient taking differently: Take 25 mg by mouth every other day. )  . Semaglutide 0.25 or 0.5 MG/DOSE SOPN Inject 0.5 mg into the skin every Monday.   . [DISCONTINUED] furosemide (LASIX) 40 MG tablet TAKE 1 TABLET BY MOUTH EVERY DAY (Patient taking differently: Take 40 mg by mouth. Taking Monday, Wednesday, friday)   No facility-administered encounter medications on file as of 08/06/2020.   ALLERGIES: Allergies  Allergen Reactions  . Zolpidem Tartrate Other (See Comments)    disorientation    VACCINATION STATUS: Immunization History   Administered Date(s) Administered  . Fluad Quad(high Dose 65+) 07/01/2019  . Influenza Split 06/30/2013, 08/30/2013  . Influenza-Unspecified 07/30/2015, 06/30/2018  . Moderna SARS-COVID-2 Vaccination 11/20/2019, 12/17/2019  . Pneumococcal Conjugate-13 01/08/2018  . Pneumococcal Polysaccharide-23 07/31/2007, 08/26/2019  . Td 10/30/2002  . Tdap 06/19/2016  . Zoster 06/19/2016    Diabetes He presents for his follow-up diabetic visit. He has type 2 diabetes mellitus. Onset time: He was diagnosed at approximate age of 57 years. His disease course has been stable. There are no hypoglycemic associated symptoms. Pertinent negatives for hypoglycemia include no confusion, pallor or seizures. There are no diabetic associated symptoms. Pertinent negatives for diabetes include  no fatigue, no polydipsia, no polyphagia, no polyuria and no weakness. There are no hypoglycemic complications. Symptoms are stable. Diabetic complications include heart disease, nephropathy and PVD. (Status post bilateral lower extremity amputations with prosthetics.) Risk factors for coronary artery disease include diabetes mellitus, dyslipidemia, hypertension, male sex, obesity, sedentary lifestyle and tobacco exposure. Current diabetic treatment includes insulin injections. He is compliant with treatment most of the time. His weight is increasing steadily. He is following a diabetic diet. When asked about meal planning, he reported none. He has had a previous visit with a dietitian. He never participates in exercise. His home blood glucose trend is fluctuating minimally. His breakfast blood glucose range is generally 110-130 mg/dl. (Patient presents today with meter, no logs, showing at target fasting glycemic profile.  His POCT A1C today is 6.7%, increasing slightly from last visit of 6%.  He was discontinued from Metformin at last visit due to decline in kidney function.  He denies any episodes of hypoglycemia.) An ACE  inhibitor/angiotensin II receptor blocker is not being taken. He does not see a podiatrist.Eye exam is current.  Hyperlipidemia This is a chronic problem. The current episode started more than 1 year ago. The problem is controlled. Recent lipid tests were reviewed and are normal. Exacerbating diseases include chronic renal disease, diabetes and obesity. Factors aggravating his hyperlipidemia include beta blockers. Pertinent negatives include no myalgias. Current antihyperlipidemic treatment includes statins. The current treatment provides moderate improvement of lipids. There are no compliance problems.  Risk factors for coronary artery disease include dyslipidemia, diabetes mellitus, hypertension, male sex, obesity and a sedentary lifestyle.    Review of systems  Constitutional: + Minimally fluctuating body weight,  current Body mass index is 34.07 kg/m. , no fatigue, no subjective hyperthermia, no subjective hypothermia Eyes: no blurry vision, no xerophthalmia ENT: no sore throat, no nodules palpated in throat, no dysphagia/odynophagia, no hoarseness Cardiovascular: no chest pain, no shortness of breath, no palpitations Respiratory: no cough, no shortness of breath Gastrointestinal: no nausea/vomiting/diarrhea Musculoskeletal: no muscle/joint aches, walks with walker d/t BLE amputations with prosthesis Skin: no rashes, no hyperemia Neurological: no tremors, no numbness, no tingling, no dizziness Psychiatric: no depression, no anxiety    Objective:    BP 129/77 (BP Location: Right Arm, Patient Position: Sitting)   Pulse 83   Ht 6' (1.829 m)   Wt 251 lb 3.2 oz (113.9 kg)   BMI 34.07 kg/m   Wt Readings from Last 3 Encounters:  08/06/20 251 lb 3.2 oz (113.9 kg)  04/26/20 242 lb 6.4 oz (110 kg)  03/31/20 242 lb 9.6 oz (110 kg)    BP Readings from Last 3 Encounters:  08/06/20 129/77  04/26/20 123/72  03/31/20 125/81     Physical Exam- Limited  Constitutional:  Body mass index  is 34.07 kg/m. , not in acute distress, normal state of mind Eyes:  EOMI, no exophthalmos Neck: Supple Cardiovascular: RRR, no murmers, rubs, or gallops, no edema Respiratory: Adequate breathing efforts, no crackles, rales, rhonchi, or wheezing Musculoskeletal: walks with walker d/t BLE amputations with prosthesis Skin:  no rashes, no hyperemia Neurological: no tremor with outstretched hands   Results for orders placed or performed in visit on 08/06/20  HgB A1c  Result Value Ref Range   Hemoglobin A1C 6.7 (A) 4.0 - 5.6 %   HbA1c POC (<> result, manual entry)     HbA1c, POC (prediabetic range)     HbA1c, POC (controlled diabetic range)    POCT UA - Microalbumin  Result Value Ref Range   Microalbumin Ur, POC 30 mg/L   Creatinine, POC     Albumin/Creatinine Ratio, Urine, POC     Complete Blood Count (Most recent): Lab Results  Component Value Date   WBC 5.2 03/16/2020   HGB 11.5 (L) 03/16/2020   HCT 35.5 (L) 03/16/2020   MCV 90.3 03/16/2020   PLT 252 03/16/2020   Chemistry (most recent): Lab Results  Component Value Date   NA 143 06/29/2020   K 4.4 06/29/2020   CL 106 06/29/2020   CO2 22 06/29/2020   BUN 22 (A) 06/29/2020   CREATININE 1.6 (A) 06/29/2020   Diabetic Labs (most recent): Lab Results  Component Value Date   HGBA1C 6.7 (A) 08/06/2020   HGBA1C 6.0 (H) 11/26/2019   HGBA1C 6.1 (H) 08/26/2019   Lipid Panel     Component Value Date/Time   CHOL 97 02/24/2020 0915   CHOL 158 02/23/2015 0931   TRIG 74 02/24/2020 0915   TRIG 98 10/27/2013 1305   HDL 32 (L) 02/24/2020 0915   HDL 55 02/23/2015 0931   HDL 39 (L) 10/27/2013 1305   CHOLHDL 3.0 02/24/2020 0915   VLDL 14 10/31/2016 1137   LDLCALC 50 02/24/2020 0915   LDLCALC 82 10/27/2013 1305    Assessment & Plan:   1. Type 2 diabetes mellitus with other circulatory complications (HCC)  -His  diabetes is  complicated by extensive peripheral arterial disease with bilateral lower extremity amputations and  patient remains at a high risk for more acute and chronic complications of diabetes which include CAD, CVA, CKD, retinopathy, and neuropathy. These are all discussed in detail with the patient.  Patient presents today with meter, no logs, showing at target fasting glycemic profile.  His POCT A1C today is 6.7%, increasing slightly from last visit of 6%.  He was discontinued from Metformin at last visit due to decline in kidney function.  He denies any episodes of hypoglycemia.   -Recent labs reviewed, showing stage stable 3 renal insufficiency.   - Nutritional counseling repeated at each appointment due to patients tendency to fall back in to old habits.  - The patient admits there is a room for improvement in their diet and drink choices. -  Suggestion is made for the patient to avoid simple carbohydrates from their diet including Cakes, Sweet Desserts / Pastries, Ice Cream, Soda (diet and regular), Sweet Tea, Candies, Chips, Cookies, Sweet Pastries,  Store Bought Juices, Alcohol in Excess of  1-2 drinks a day, Artificial Sweeteners, Coffee Creamer, and "Sugar-free" Products. This will help patient to have stable blood glucose profile and potentially avoid unintended weight gain.   - I encouraged the patient to switch to  unprocessed or minimally processed complex starch and increased protein intake (animal or plant source), fruits, and vegetables.   - Patient is advised to stick to a routine mealtimes to eat 3 meals  a day and avoid unnecessary snacks ( to snack only to correct hypoglycemia).  - I have approached patient with the following individualized plan to manage diabetes and patient agrees.  -Given his stable glycemic profile, will continue current medication regimen.  -He is advised to continue Tresiba 20 units SQ nightly (samples provided from clinic) and continue Ozempic 0.5 mg SQ weekly (samples provided from clinic).  -He is encouraged to continue monitoring blood glucose twice  daily, before breakfast and before bed, and call the clinic if he gets readings less than 70 or greater than 200 for 3 tests  in a row.  - His insurance did not provide coverage for Ozempic nor Trulicity.  2) Hypertension: His blood pressure is controlled to target.  He is advised to continue Atenolol 25 mg po daily, and Lasix 20 mg po every other day.  3) Lipids/HPL:  His most recent lipid panel from 02/24/20 shows controlled LDL at 50.  He is advised to continue Lipitor 80 mg po daily at bedtime.  Side effects and precautions discussed with him.   - I advised patient to maintain close follow up with Alycia Rossetti, MD for primary care needs.  - Time spent on this patient care encounter:  30 min, of which > 50% was spent in  counseling and the rest reviewing his blood glucose logs , discussing his hypoglycemia and hyperglycemia episodes, reviewing his current and  previous labs / studies  ( including abstraction from other facilities) and medications  doses and developing a  long term treatment plan and documenting his care.   Please refer to Patient Instructions for Blood Glucose Monitoring and Insulin/Medications Dosing Guide"  in media tab for additional information. Please  also refer to " Patient Self Inventory" in the Media  tab for reviewed elements of pertinent patient history.  Ozella Almond Dimarco participated in the discussions, expressed understanding, and voiced agreement with the above plans.  All questions were answered to his satisfaction. he is encouraged to contact clinic should he have any questions or concerns prior to his return visit.    Follow up plan: -Return in about 6 months (around 02/04/2021) for Diabetes follow up with A1c in office, Previsit labs.  Rayetta Pigg, [facility] [address] [place], [place] [address] Phone: [phone] Fax: [phone]  08/06/2020, 10:10 AM

## 2020-08-27 ENCOUNTER — Encounter: Payer: Self-pay | Admitting: Family Medicine

## 2020-08-27 ENCOUNTER — Ambulatory Visit (INDEPENDENT_AMBULATORY_CARE_PROVIDER_SITE_OTHER): Payer: Medicare Other | Admitting: Family Medicine

## 2020-08-27 ENCOUNTER — Other Ambulatory Visit: Payer: Self-pay

## 2020-08-27 VITALS — BP 120/80 | HR 90 | Temp 98.2°F | Ht 76.0 in | Wt 252.0 lb

## 2020-08-27 DIAGNOSIS — I10 Essential (primary) hypertension: Secondary | ICD-10-CM

## 2020-08-27 DIAGNOSIS — N182 Chronic kidney disease, stage 2 (mild): Secondary | ICD-10-CM

## 2020-08-27 DIAGNOSIS — M25561 Pain in right knee: Secondary | ICD-10-CM | POA: Diagnosis not present

## 2020-08-27 DIAGNOSIS — G8929 Other chronic pain: Secondary | ICD-10-CM | POA: Diagnosis not present

## 2020-08-27 DIAGNOSIS — Z89612 Acquired absence of left leg above knee: Secondary | ICD-10-CM

## 2020-08-27 DIAGNOSIS — E113393 Type 2 diabetes mellitus with moderate nonproliferative diabetic retinopathy without macular edema, bilateral: Secondary | ICD-10-CM | POA: Diagnosis not present

## 2020-08-27 DIAGNOSIS — E669 Obesity, unspecified: Secondary | ICD-10-CM

## 2020-08-27 DIAGNOSIS — E1159 Type 2 diabetes mellitus with other circulatory complications: Secondary | ICD-10-CM | POA: Diagnosis not present

## 2020-08-27 DIAGNOSIS — Z89511 Acquired absence of right leg below knee: Secondary | ICD-10-CM | POA: Diagnosis not present

## 2020-08-27 NOTE — Assessment & Plan Note (Signed)
Follows with nephrology.

## 2020-08-27 NOTE — Assessment & Plan Note (Signed)
Controlled no changes 

## 2020-08-27 NOTE — Patient Instructions (Signed)
F/U 6 months Referral to Orthopedics

## 2020-08-27 NOTE — Progress Notes (Signed)
   Subjective:    Patient ID: [name], male    DOB: [birth year], 69 y.o.   MRN: [id number]  Patient presents for Follow-up (x45mos Pt styated that he has been having more Rt knee pain than normal)  Pt here to f/u chronic medical problems  Medications reviewed  Right knee pain for the past few months. He has a prosthesis his BKA.Even withou prosthesis he has pain in the knee He rarely has phantom limb pain in the right  Dr. Rodell Perna was surgeon   PSA- urology every 6 months, last PSA undetectable , myrbetrqi takes 25mg  every other day   DM type 2- he is off the metformin now. Still on insulin therapy , he is on Tresiba 20 units and Ozempic 0.5mg  weekly   HTN- taking atenolol   CKD- lasix has been weaned to 20mg  three times a week   Flu shot UTD  Reviewed recent labs from endocrinology   Review Of Systems:  GEN- denies fatigue, fever, weight loss,weakness, recent illness HEENT- denies eye drainage, change in vision, nasal discharge, CVS- denies chest pain, palpitations RESP- denies SOB, cough, wheeze ABD- denies N/V, change in stools, abd pain GU- denies dysuria, hematuria, dribbling, incontinence MSK- + joint pain, muscle aches, injury Neuro- denies headache, dizziness, syncope, seizure activity       Objective:    BP 120/80 (BP Location: Right Arm, Patient Position: Sitting, Cuff Size: Large)   Pulse 90   Temp 98.2 F (36.8 C) (Oral)   Ht 6\' 4"  (1.93 m)   Wt 252 lb (114.3 kg)   SpO2 99%   BMI 30.67 kg/m   GEN-NAD, alert and oriented x 3, examined sitting in chair  HEENT- PERRL, EOMI, non injected sclera, pink conjunctiva, CVS- RRR, no murmur RESP-CTAB EXT- bilat amputee with prosthesis bilat Pulses- Radial 2      Assessment & Plan:      Problem List Items Addressed This Visit      Unprioritized   CKD (chronic kidney disease) stage 2, GFR 60-89 ml/min    Follows with nephrology       Class 1 obesity   Diabetic retinopathy ([place])   DM type 2  causing vascular disease ([place])    Per endocrinology Renal function at baseline  On statin drug       Essential hypertension - Primary    Controlled no changes       History of left above knee amputation ([place])   Status post below knee amputation of right lower extremity ([place])    Now with knee pain Concern this will affect mobility wit prosthesis Will refer back to orthopedics for evaluation       Relevant Orders   Ambulatory referral to Orthopedic Surgery    Other Visit Diagnoses    Chronic pain of right knee       Relevant Orders   Ambulatory referral to Orthopedic Surgery      Note: This dictation was prepared with Dragon dictation along with smaller phrase technology. Any transcriptional errors that result from this process are unintentional.

## 2020-08-27 NOTE — Assessment & Plan Note (Signed)
Now with knee pain Concern this will affect mobility wit prosthesis Will refer back to orthopedics for evaluation

## 2020-08-27 NOTE — Assessment & Plan Note (Signed)
Per endocrinology Renal function at baseline  On statin drug

## 2020-08-30 ENCOUNTER — Ambulatory Visit: Payer: Self-pay | Admitting: Pharmacist

## 2020-08-30 ENCOUNTER — Telehealth: Payer: Self-pay

## 2020-08-30 NOTE — Chronic Care Management (AMB) (Signed)
  Chronic Care Management   Follow Up Note   08/30/2020 Name: Lazar Tierce BORGERDING MRN: [id number] DOB: [birth year]  Referred by: Alycia Rossetti, MD Reason for referral : No chief complaint on file.   JHONY ANTRIM is a 69 y.o. year old male who is a primary care patient of [place], Modena Nunnery, MD. The CCM team was consulted for assistance with chronic disease management and care coordination needs.    Review of patient status, including review of consultants reports, relevant laboratory and other test results, and collaboration with appropriate care team members and the patient's provider was performed as part of comprehensive patient evaluation and provision of chronic care management services.    SDOH (Social Determinants of Health) assessments performed: No See Care Plan activities for detailed interventions related to [facility])     Outpatient Encounter Medications as of 08/30/2020  Medication Sig  . acetaminophen (TYLENOL) 325 MG tablet Take 600 mg by mouth daily as needed for mild pain, moderate pain or headache.   Marland Kitchen atenolol (TENORMIN) 25 MG tablet TAKE 1 TABLET BY MOUTH EVERY DAY  . atorvastatin (LIPITOR) 80 MG tablet TAKE 1 TABLET BY MOUTH EVERY DAY  . B-D UF III MINI PEN NEEDLES 31G X 5 MM MISC USE FOUR TIMES DAILY AS DIRECTED  . Calcium Carb-Cholecalciferol (CALCIUM PLUS VITAMIN D3) 600-500 MG-UNIT CAPS Take 2 capsules by mouth daily.   . clopidogrel (PLAVIX) 75 MG tablet TAKE 1 TABLET BY MOUTH EVERY DAY  . CONTOUR TEST test strip TEST TWICE DAILY E11.65  . furosemide (LASIX) 20 MG tablet Take 20 mg by mouth 3 (three) times a week.  Marland Kitchen glucose blood (CONTOUR NEXT TEST) test strip Use as instructed bid. E11.65  . glucose blood test strip 1 each by Other route 2 (two) times daily. Use as instructed bid. E11.65 Contour next  . insulin degludec (TRESIBA FLEXTOUCH) 100 UNIT/ML SOPN FlexTouch Pen Inject 20 Units into the skin at bedtime.  . mirabegron ER (MYRBETRIQ) 25 MG TB24 tablet Take 1 tablet  (25 mg total) by mouth daily. (Patient taking differently: Take 25 mg by mouth every other day. )  . Semaglutide 0.25 or 0.5 MG/DOSE SOPN Inject 0.5 mg into the skin every Monday.    No facility-administered encounter medications on file as of 08/30/2020.     Objective:  PAP application for [employer] for assistance with copays on Tresiba and Ozepmic.  Goals Addressed   None   Reached out to patient regarding proof of income needed for these applications.  Will fill out the remainder of the application for him, pending his documents and PCP signature will fax to program for evaluation.  Plan:   The patient has been provided with contact information for the care management team and has been advised to call with any health related questions or concerns.   Beverly Milch, PharmD Clinical Pharmacist [place] [phone]

## 2020-08-30 NOTE — Telephone Encounter (Signed)
Pt called wanting to know if we had any samples of Myrbetriq. Said Dr. Alyson Ingles said he could have some when they came in.Pt notified that 1 mth of samples up front for him to pick up.

## 2020-08-31 ENCOUNTER — Other Ambulatory Visit: Payer: Self-pay

## 2020-08-31 DIAGNOSIS — C61 Malignant neoplasm of prostate: Secondary | ICD-10-CM

## 2020-09-06 ENCOUNTER — Other Ambulatory Visit: Payer: Medicare Other

## 2020-09-06 ENCOUNTER — Other Ambulatory Visit: Payer: Self-pay

## 2020-09-06 DIAGNOSIS — C61 Malignant neoplasm of prostate: Secondary | ICD-10-CM | POA: Diagnosis not present

## 2020-09-07 LAB — PSA: Prostate Specific Ag, Serum: <0.1 ng/mL (ref 0.0–4.0)

## 2020-09-09 ENCOUNTER — Ambulatory Visit (INDEPENDENT_AMBULATORY_CARE_PROVIDER_SITE_OTHER): Payer: Medicare Other

## 2020-09-09 ENCOUNTER — Encounter: Payer: Self-pay | Admitting: Orthopaedic Surgery

## 2020-09-09 ENCOUNTER — Other Ambulatory Visit: Payer: Self-pay

## 2020-09-09 ENCOUNTER — Ambulatory Visit (INDEPENDENT_AMBULATORY_CARE_PROVIDER_SITE_OTHER): Payer: Medicare Other | Admitting: Orthopaedic Surgery

## 2020-09-09 VITALS — Ht 72.0 in | Wt 248.0 lb

## 2020-09-09 DIAGNOSIS — M25561 Pain in right knee: Secondary | ICD-10-CM | POA: Diagnosis not present

## 2020-09-09 DIAGNOSIS — M659 Synovitis and tenosynovitis, unspecified: Secondary | ICD-10-CM

## 2020-09-09 NOTE — Progress Notes (Signed)
Office Visit Note   Patient: [name]           Date of Birth: 05/13/1951           MRN: [id number] Visit Date: 09/09/2020              Requested by: Alycia Rossetti, MD [address] [place],  [place] [id number] PCP: Alycia Rossetti, MD   Assessment & Plan: Visit Diagnoses:  1. Acute pain of right knee   2. Synovitis of right knee     Plan: Cortisone injection right knee which she tolerated well.  Good relief post injection once he had his prosthesis back on and was ambulating with his walker.  A1c on 08/06/2020 was 6.7.  He will follow-up as needed.  Follow-Up Instructions: No follow-ups on file.   Orders:  Orders Placed This Encounter  Procedures  . XR Knee 1-2 Views Right   No orders of the defined types were placed in this encounter.     Procedures: Large Joint Inj: R knee on 09/10/2020 12:33 PM Indications: pain and joint swelling Details: 22 G 1.5 in needle, anterolateral approach  Arthrogram: No  Medications: 40 mg methylPREDNISolone acetate 40 MG/ML; 0.5 mL lidocaine 1 %; 4 mL bupivacaine 0.25 % Outcome: tolerated well, no immediate complications Procedure, treatment alternatives, risks and benefits explained, specific risks discussed. Consent was given by the patient. Immediately prior to procedure a time out was called to verify the correct patient, procedure, equipment, support staff and site/side marked as required. Patient was prepped and draped in the usual sterile fashion.       Clinical Data: No additional findings.   Subjective: Chief Complaint  Patient presents with  . Right Knee - Pain    HPI 69 year old male long-term patient with type 2 diabetes chronic kidney disease and right AKA on the left and right BKA with prosthesis.  He has both prosthesis on is amatory with a walker.  He has had pain in his knee with some swelling synovitis.  No problems with his right BKA stump.  Pain is along the joint line slightly more medial than  lateral.  Review of Systems all other systems are noncontributory.   Objective: Vital Signs: Ht 6' (1.829 m)   Wt 248 lb (112.5 kg)   BMI 33.63 kg/m   Physical Exam Constitutional:      Appearance: He is well-developed.  HENT:     Head: Normocephalic and atraumatic.  Eyes:     Pupils: Pupils are equal, round, and reactive to light.  Neck:     Thyroid: No thyromegaly.     Trachea: No tracheal deviation.  Cardiovascular:     Rate and Rhythm: Normal rate.  Pulmonary:     Effort: Pulmonary effort is normal.     Breath sounds: No wheezing.  Abdominal:     General: Bowel sounds are normal.     Palpations: Abdomen is soft.  Skin:    General: Skin is warm and dry.     Capillary Refill: Capillary refill takes less than 2 seconds.  Neurological:     Mental Status: He is alert and oriented to person, place, and time.  Psychiatric:        Behavior: Behavior normal.        Thought Content: Thought content normal.        Judgment: Judgment normal.     Ortho Exam well-healed stump.  He has slight prominence over distal anterior  aspect of the tibia without cellulitis or fluid collection.  Medial joint line tenderness more than lateral.  No significant tenderness over the patellar tendon.  Pes bursa region is normal.  He has full extension of his knee good quad strength good hamstring strength and good flexion. Specialty Comments:  No specialty comments available.  Imaging: No results found.   PMFS History: Patient Active Problem List   Diagnosis Date Noted  . Synovitis of right knee 09/10/2020  . Anemia 04/26/2020  . Type 2 diabetes mellitus with stage 3b chronic kidney disease, with long-term current use of insulin ([place]) 03/31/2020  . Nocturia 03/19/2020  . Urge incontinence 03/19/2020  . Diabetic retinopathy ([place]) 02/24/2020  . Radiation proctitis 02/03/2019  . Rectal bleeding 09/02/2018  . Hx of adenomatous colonic polyps 09/02/2018  . Gait instability 07/10/2017  .  Prostate cancer ([place]) 06/08/2017  . Disorder of bursa 05/22/2016  . Status post below knee amputation of right lower extremity ([place]) 05/14/2015  . History of left above knee amputation ([place]) 05/14/2015  . OSA (obstructive sleep apnea) 02/23/2015  . CKD (chronic kidney disease) stage 2, GFR 60-89 ml/min 06/28/2014  . Class 1 obesity 08/17/2010  . DM type 2 causing vascular disease ([place]) 08/11/2010  . Mixed hyperlipidemia 08/11/2010  . Essential hypertension 08/11/2010   Past Medical History:  Diagnosis Date  . Arthritis   . Cerebrovascular disease    MRI shows Right carotid inferior cavernours narrowing 75% and  50-75% stenosis of Cavernous and supraclinoi right side  . CKD (chronic kidney disease) 06/28/2014   Sees Dr Florene Glen  . Diabetic retinopathy ([place])   . Hyperlipidemia   . Hypertension   . Myocardial infarction ([place])    "mild" heart attack  . Necrosis ([place])    #2 nail   . Pneumonia    as a child  . Poor circulation of extremity   . Prostate cancer ([place]) 2017   Prostate  . Sleep apnea    uses cpap, getting a new one  . Type 2 Diabetes mellitus    Type 2    Family History  Problem Relation Age of Onset  . Diabetes Mother   . Hypertension Mother   . Cancer Father        lung  . Cancer Sister        breast  . Sickle cell anemia Daughter   . Cancer Maternal Grandfather        prostate  . Colon cancer Neg Hx     Past Surgical History:  Procedure Laterality Date  . ABOVE KNEE LEG AMPUTATION Left 2004   started out below knee and then extended to above knee due to poor healing  . AMPUTATION Right 04/28/2015   Procedure: AMPUTATION RAY, RIGHT 5TH TOE;  Surgeon: Marybelle Killings, MD;  Location: [facility];  Service: Orthopedics;  Laterality: Right;  . AMPUTATION Right 05/10/2015   Procedure: Right Below Knee Amputation;  Surgeon: Marybelle Killings, MD;  Location: [facility];  Service: Orthopedics;  Laterality: Right;  . CATARACT EXTRACTION W/PHACO  09/05/2012   Procedure: CATARACT  EXTRACTION PHACO AND INTRAOCULAR LENS PLACEMENT (IOC);  Surgeon: Tonny Branch, MD;  Location: [facility];  Service: Ophthalmology;  Laterality: Left;  CDE=5.45  . CATARACT EXTRACTION W/PHACO  10/03/2012   Procedure: CATARACT EXTRACTION PHACO AND INTRAOCULAR LENS PLACEMENT (IOC);  Surgeon: Tonny Branch, MD;  Location: [facility];  Service: Ophthalmology;  Laterality: Right;  CDE: 12.31  . COLONOSCOPY  2011   Dr. Deatra Ina:  colon polyps, tubular adenoma  . COLONOSCOPY N/A 11/01/2018   Dr. Gala Romney: Blood noted in the rectal vault.  Vascular pattern of the rectum abnormal, neovascular changes distally and actively oozing.  There were 3 2 to 5 mm polyps in the splenic flexure, cecum removed.  Cecal polyp was not recovered.  Radiation proctitis status post APC treatment.  The splenic flexure polyps were tubular adenomas.  Next colonoscopy in 5 years.  Marland Kitchen FLEXIBLE SIGMOIDOSCOPY N/A 10/27/2019   Procedure: FLEXIBLE SIGMOIDOSCOPY;  Surgeon: Danie Binder, MD; rectal bleeding due to radiation proctitis s/p APC therapy (actively bleeding during exam), rectosigmoid colon and sigmoid colon appeared normal.  . POLYPECTOMY  11/01/2018   Procedure: POLYPECTOMY;  Surgeon: Daneil Dolin, MD;  [facility];  Service: Endoscopy;;  cecum,splenic flexure   Social History   Occupational History  . Occupation: Mining engineer: [employer]  Tobacco Use  . Smoking status: Never Smoker  . Smokeless tobacco: Never Used  Vaping Use  . Vaping Use: Never used  Substance and Sexual Activity  . Alcohol use: No  . Drug use: No  . Sexual activity: Never

## 2020-09-10 ENCOUNTER — Other Ambulatory Visit: Payer: Medicare Other

## 2020-09-10 DIAGNOSIS — M659 Synovitis and tenosynovitis, unspecified: Secondary | ICD-10-CM

## 2020-09-10 MED ORDER — BUPIVACAINE HCL 0.25 % IJ SOLN
4.0000 mL | INTRAMUSCULAR | Status: AC | PRN
  Administered 2020-09-10: 4 mL via INTRA_ARTICULAR

## 2020-09-10 MED ORDER — METHYLPREDNISOLONE ACETATE 40 MG/ML IJ SUSP
40.0000 mg | INTRAMUSCULAR | Status: AC | PRN
  Administered 2020-09-10: 40 mg via INTRA_ARTICULAR

## 2020-09-10 MED ORDER — LIDOCAINE HCL 1 % IJ SOLN
0.5000 mL | INTRAMUSCULAR | Status: AC | PRN
  Administered 2020-09-10: .5 mL

## 2020-09-14 ENCOUNTER — Ambulatory Visit: Payer: Medicare Other | Admitting: Pharmacist

## 2020-09-14 DIAGNOSIS — E1159 Type 2 diabetes mellitus with other circulatory complications: Secondary | ICD-10-CM

## 2020-09-14 DIAGNOSIS — I10 Essential (primary) hypertension: Secondary | ICD-10-CM

## 2020-09-14 DIAGNOSIS — E782 Mixed hyperlipidemia: Secondary | ICD-10-CM

## 2020-09-14 NOTE — Patient Instructions (Addendum)
Visit Information  Goals Addressed   None     The patient verbalized understanding of instructions, educational materials, and care plan provided today and agreed to receive a mailed copy of patient instructions, educational materials, and care plan.   Telephone follow up appointment with pharmacy team member scheduled for: 6 months  Beverly Milch, PharmD Clinical Pharmacist [facility] [phone]   Diabetes Mellitus and [place] care is an important part of your health, especially when you have diabetes. Diabetes may cause you to have problems because of poor blood flow (circulation) to your feet and legs, which can cause your skin to:  Become thinner and drier.  Break more easily.  Heal more slowly.  Peel and crack. You may also have nerve damage (neuropathy) in your legs and feet, causing decreased feeling in them. This means that you may not notice minor injuries to your feet that could lead to more serious problems. Noticing and addressing any potential problems early is the best way to prevent future foot problems. How to care for your feet Foot hygiene  Wash your feet daily with warm water and mild soap. Do not use hot water. Then, pat your feet and the areas between your toes until they are completely dry. Do not soak your feet as this can dry your skin.  Trim your toenails straight across. Do not dig under them or around the cuticle. File the edges of your nails with an emery board or nail file.  Apply a moisturizing lotion or petroleum jelly to the skin on your feet and to dry, brittle toenails. Use lotion that does not contain alcohol and is unscented. Do not apply lotion between your toes. Shoes and socks  Wear clean socks or stockings every day. Make sure they are not too tight. Do not wear knee-high stockings since they may decrease blood flow to your legs.  Wear shoes that fit properly and have enough cushioning. Always look in your  shoes before you put them on to be sure there are no objects inside.  To break in new shoes, wear them for just a few hours a day. This prevents injuries on your feet. Wounds, scrapes, corns, and calluses  Check your feet daily for blisters, cuts, bruises, sores, and redness. If you cannot see the bottom of your feet, use a mirror or ask someone for help.  Do not cut corns or calluses or try to remove them with medicine.  If you find a minor scrape, cut, or break in the skin on your feet, keep it and the skin around it clean and dry. You may clean these areas with mild soap and water. Do not clean the area with peroxide, alcohol, or iodine.  If you have a wound, scrape, corn, or callus on your foot, look at it several times a day to make sure it is healing and not infected. Check for: ? Redness, swelling, or pain. ? Fluid or blood. ? Warmth. ? Pus or a bad smell. General instructions  Do not cross your legs. This may decrease blood flow to your feet.  Do not use heating pads or hot water bottles on your feet. They may burn your skin. If you have lost feeling in your feet or legs, you may not know this is happening until it is too late.  Protect your feet from hot and cold by wearing shoes, such as at the beach or on hot pavement.  Schedule a complete foot exam  at least once a year (annually) or more often if you have foot problems. If you have foot problems, report any cuts, sores, or bruises to your health care provider immediately. Contact a health care provider if:  You have a medical condition that increases your risk of infection and you have any cuts, sores, or bruises on your feet.  You have an injury that is not healing.  You have redness on your legs or feet.  You feel burning or tingling in your legs or feet.  You have pain or cramps in your legs and feet.  Your legs or feet are numb.  Your feet always feel cold.  You have pain around a toenail. Get help right away  if:  You have a wound, scrape, corn, or callus on your foot and: ? You have pain, swelling, or redness that gets worse. ? You have fluid or blood coming from the wound, scrape, corn, or callus. ? Your wound, scrape, corn, or callus feels warm to the touch. ? You have pus or a bad smell coming from the wound, scrape, corn, or callus. ? You have a fever. ? You have a red line going up your leg. Summary  Check your feet every day for cuts, sores, red spots, swelling, and blisters.  Moisturize feet and legs daily.  Wear shoes that fit properly and have enough cushioning.  If you have foot problems, report any cuts, sores, or bruises to your health care provider immediately.  Schedule a complete foot exam at least once a year (annually) or more often if you have foot problems. This information is not intended to replace advice given to you by your health care provider. Make sure you discuss any questions you have with your health care provider. Document Revised: 07/09/2019 Document Reviewed: 11/17/2016 Elsevier Patient Education  [place].

## 2020-09-14 NOTE — Chronic Care Management (AMB) (Signed)
Chronic Care Management Pharmacy  Name: [name]  MRN: [id number] DOB: 17-May-1951  Chief Complaint/ HPI  [name],  69 y.o. , male presents for their Follow-Up CCM visit with the clinical pharmacist via telephone.  PCP : Alycia Rossetti, MD  Their chronic conditions include: Type II DM, hypertension, CKD, hyperlipidemia.  Office Visits: 08/30/2020 (CCM) - he needs assistance with Tyler Aas and Ozempic  02/24/2020 [facility]) - general physical, renal function had improved,   Consult Visit: 09/09/2020 Lorin Mercy, [facility]) - R knee pain, given cortisone shot in R knee.  12/31/2019 [facility], Oncology) - prostate cancer high risk, followed by urology also, PSA has remained unremarkable, no urinary symptoms  12/01/2019 ([facility], endocrinology) - is getting samples for Tyler Aas and Ozempic   Medications: Outpatient Encounter Medications as of 09/14/2020  Medication Sig  . acetaminophen (TYLENOL) 325 MG tablet Take 600 mg by mouth daily as needed for mild pain, moderate pain or headache.   Marland Kitchen atenolol (TENORMIN) 25 MG tablet TAKE 1 TABLET BY MOUTH EVERY DAY  . atorvastatin (LIPITOR) 80 MG tablet TAKE 1 TABLET BY MOUTH EVERY DAY  . B-D UF III MINI PEN NEEDLES 31G X 5 MM MISC USE FOUR TIMES DAILY AS DIRECTED  . Calcium Carb-Cholecalciferol (CALCIUM PLUS VITAMIN D3) 600-500 MG-UNIT CAPS Take 2 capsules by mouth daily.   . clopidogrel (PLAVIX) 75 MG tablet TAKE 1 TABLET BY MOUTH EVERY DAY  . CONTOUR TEST test strip TEST TWICE DAILY E11.65  . furosemide (LASIX) 20 MG tablet Take 20 mg by mouth 3 (three) times a week.  Marland Kitchen glucose blood (CONTOUR NEXT TEST) test strip Use as instructed bid. E11.65  . glucose blood test strip 1 each by Other route 2 (two) times daily. Use as instructed bid. E11.65 Contour next  . insulin degludec (TRESIBA FLEXTOUCH) 100 UNIT/ML SOPN FlexTouch Pen Inject 20 Units into the skin at bedtime.  . mirabegron ER (MYRBETRIQ) 25 MG TB24 tablet Take 1 tablet (25 mg total) by  mouth daily. (Patient taking differently: Take 25 mg by mouth every other day. )  . Semaglutide 0.25 or 0.5 MG/DOSE SOPN Inject 0.5 mg into the skin every Monday.    No facility-administered encounter medications on file as of 09/14/2020.     Current Diagnosis/Assessment:  Goals Addressed   None     Diabetes   Recent Relevant Labs: Lab Results  Component Value Date/Time   HGBA1C 6.7 (A) 08/06/2020 09:48 AM   HGBA1C 6.0 (H) 11/26/2019 03:20 PM   HGBA1C 6.1 (H) 08/26/2019 12:36 PM   MICROALBUR 30 08/06/2020 09:48 AM   MICROALBUR 0.2 10/31/2016 11:37 AM     Checking BG: 2x per Day  Recent FBG Readings: 115-120 Before bed: 145 average  Patient has failed these meds in past: Byetta, Novolog, Toujeo Patient is currently controlled on the following medications:  Ozempic 0.5mg  SQ every Monday  Tresiba 100u/ml 20 units SQ hs  Last diabetic Foot exam:  Lab Results  Component Value Date/Time   HMDIABEYEEXA Retinopathy (A) 09/29/2019 01:54 PM    Last diabetic Eye exam: No results found for: HMDIABFOOTEX   We discussed:  Endocrinology took him off of metformin, A1c increased from 6.0 to 6.7 in June.  Patient reports fasting and evening blood sugars WNL.  We discussed to continue to work on limiting carbohydrates and sweets to prevent A1c from continuing to rise off of the metformin.  Exercise is hard as patient is a double amputee so diet will be very important.  Patient provided W2 today to apply for patient assistance on Tresiba and Ozempic, he reports compliance with these and adequate supply at this time.  He denies any hypo/hyper glycemia readings or symptoms.  Plan  Continue current medications.   Send application once completed to [employer] for two injectables.  Hypertension   Office blood pressures are  BP Readings from Last 3 Encounters:  08/27/20 120/80  08/06/20 129/77  04/26/20 123/72    Patient has failed these meds in the past: lisinopril  20mg   Patient checks BP at home rarely   Patient is currently controlled on the following medications:  Atenolol 25mg  daily   We discussed:  Medication unchanged  He takes daily with no concerns  Plan  Continue current medications     Hyperlipidemia   Lipid Panel     Component Value Date/Time   CHOL 97 02/24/2020 0915   CHOL 158 02/23/2015 0931   TRIG 74 02/24/2020 0915   TRIG 98 10/27/2013 1305   HDL 32 (L) 02/24/2020 0915   HDL 55 02/23/2015 0931   HDL 39 (L) 10/27/2013 1305   CHOLHDL 3.0 02/24/2020 0915   VLDL 14 10/31/2016 1137   LDLCALC 50 02/24/2020 0915   LDLCALC 82 10/27/2013 1305   LABVLDL 17 02/23/2015 0931      Patient has failed these meds in past: crestor 20mg   Patient is currently controlled on the following medications: atorvastatin 80mg   We discussed:  Denies myalgias  Most recent LDL is WNL  Taking appropriately     Plan  Continue current medications   Vaccines   Reviewed and discussed patient's vaccination history.    Immunization History  Administered Date(s) Administered  . Fluad Quad(high Dose 65+) 07/01/2019  . Influenza Split 06/30/2013, 08/30/2013, 08/23/2020  . Influenza-Unspecified 07/30/2015, 06/30/2018  . Moderna SARS-COVID-2 Vaccination 11/20/2019, 12/17/2019  . Pneumococcal Conjugate-13 01/08/2018  . Pneumococcal Polysaccharide-23 07/31/2007, 08/26/2019  . Td 10/30/2002  . Tdap 06/19/2016  . Zoster 06/19/2016    Plan  Patient up to date on all recommended vaccines.    Medication Management   . Miscellaneous medications: Myrbetriq ER 25mg , plavix 75mg  . OTC's: calcium/vit D 600-500 . Patient currently uses [facility].  Phone #  [phone] . Patient reports using pill box method to organize medications and promote adherence. . Patient denies missed doses of medication   Beverly Milch, PharmD Clinical Pharmacist [facility] [phone]

## 2020-09-16 NOTE — Progress Notes (Signed)
09/17/2020 2:58 PM   [name] 10-05-1951 [id number]  Referring provider: Alycia Rossetti, MD [address] [place],  [place] [address]  Prostate Cancer and urge incontinence  HPI: [name] is a 69yo here for followup for prostate cancer and urge incontinence. PSA from 09/06/20 is undetectable. Intermittent hot flashes. NO new bone pain. He is on mirabegron 25mg  which improved his urgency, urge incontinence and nocturia. When he takes mirabegon he has nocturia only 1x.  His IPSS is 7.  He needs additional samples of the Myrbetriq.   His UA looks infected today but he has had no hematuria or dysuria.  He has no incontinence.     His records from [facility] are as follows: Tom. Bradshaw returns today in f/u. He has T3 N1 M0 gleason 9 prostate cancer. A PET scan showed right external iliac node involvment. He was given Firmagon o240mg  on 06/22/17 and had gold markers placed on 07/27/18. He has EXRT that he completed in 12/18. He is due for Eligard today.   His PSA remains <0.1. He has hot flashes on occasion but not as many. He reports no weight loss or bone pain. He has no other associated signs or symptoms.    PMH: Past Medical History:  Diagnosis Date  . Arthritis   . Cerebrovascular disease    MRI shows Right carotid inferior cavernours narrowing 75% and  50-75% stenosis of Cavernous and supraclinoi right side  . CKD (chronic kidney disease) 06/28/2014   Sees Dr Florene Glen  . Diabetic retinopathy ([place])   . Hyperlipidemia   . Hypertension   . Myocardial infarction ([place])    "mild" heart attack  . Necrosis ([place])    #2 nail   . Pneumonia    as a child  . Poor circulation of extremity   . Prostate cancer ([place]) 2017   Prostate  . Sleep apnea    uses cpap, getting a new one  . Type 2 Diabetes mellitus    Type 2    Surgical History: Past Surgical History:  Procedure Laterality Date  . ABOVE KNEE LEG AMPUTATION Left 2004   started out below knee and then extended to above knee due to  poor healing  . AMPUTATION Right 04/28/2015   Procedure: AMPUTATION RAY, RIGHT 5TH TOE;  Surgeon: Marybelle Killings, MD;  Location: [facility];  Service: Orthopedics;  Laterality: Right;  . AMPUTATION Right 05/10/2015   Procedure: Right Below Knee Amputation;  Surgeon: Marybelle Killings, MD;  Location: [facility];  Service: Orthopedics;  Laterality: Right;  . CATARACT EXTRACTION W/PHACO  09/05/2012   Procedure: CATARACT EXTRACTION PHACO AND INTRAOCULAR LENS PLACEMENT (IOC);  Surgeon: Tonny Branch, MD;  [facility];  Service: Ophthalmology;  Laterality: Left;  CDE=5.45  . CATARACT EXTRACTION W/PHACO  10/03/2012   Procedure: CATARACT EXTRACTION PHACO AND INTRAOCULAR LENS PLACEMENT (IOC);  Surgeon: Tonny Branch, MD;  [facility];  Service: Ophthalmology;  Laterality: Right;  CDE: 12.31  . COLONOSCOPY  2011   Dr. Deatra Ina: colon polyps, tubular adenoma  . COLONOSCOPY N/A 11/01/2018   Dr. Gala Romney: Blood noted in the rectal vault.  Vascular pattern of the rectum abnormal, neovascular changes distally and actively oozing.  There were 3 2 to 5 mm polyps in the splenic flexure, cecum removed.  Cecal polyp was not recovered.  Radiation proctitis status post APC treatment.  The splenic flexure polyps were tubular adenomas.  Next colonoscopy in 5 years.  Marland Kitchen FLEXIBLE SIGMOIDOSCOPY N/A  10/27/2019   Procedure: FLEXIBLE SIGMOIDOSCOPY;  Surgeon: Danie Binder, MD; rectal bleeding due to radiation proctitis s/p APC therapy (actively bleeding during exam), rectosigmoid colon and sigmoid colon appeared normal.  . POLYPECTOMY  11/01/2018   Procedure: POLYPECTOMY;  Surgeon: Daneil Dolin, MD;  [facility];  Service: Endoscopy;;  cecum,splenic flexure    Home Medications:  Allergies as of 09/17/2020      Reactions   Zolpidem Tartrate Other (See Comments)   disorientation      Medication List       Accurate as of September 17, 2020  2:58 PM. If you have any questions, ask your nurse or doctor.        acetaminophen 325  MG tablet Commonly known as: TYLENOL Take 600 mg by mouth daily as needed for mild pain, moderate pain or headache.   atenolol 25 MG tablet Commonly known as: TENORMIN TAKE 1 TABLET BY MOUTH EVERY DAY   atorvastatin 80 MG tablet Commonly known as: LIPITOR TAKE 1 TABLET BY MOUTH EVERY DAY   B-D UF III MINI PEN NEEDLES 31G X 5 MM Misc Generic drug: Insulin Pen Needle USE FOUR TIMES DAILY AS DIRECTED   Calcium Plus Vitamin D3 600-500 MG-UNIT Caps Generic drug: Calcium Carb-Cholecalciferol Take 2 capsules by mouth daily.   clopidogrel 75 MG tablet Commonly known as: PLAVIX TAKE 1 TABLET BY MOUTH EVERY DAY   furosemide 20 MG tablet Commonly known as: LASIX Take 20 mg by mouth 3 (three) times a week.   glucose blood test strip 1 each by Other route 2 (two) times daily. Use as instructed bid. E11.65 Contour next   Contour Test test strip Generic drug: glucose blood TEST TWICE DAILY E11.65   glucose blood test strip Commonly known as: Contour Next Test Use as instructed bid. E11.65   mirabegron ER 25 MG Tb24 tablet Commonly known as: Myrbetriq Take 1 tablet (25 mg total) by mouth daily. What changed: when to take this   Semaglutide(0.25 or 0.5MG /DOS) 2 MG/1.5ML Sopn Inject 0.5 mg into the skin every Monday.   Tyler Aas FlexTouch 100 UNIT/ML FlexTouch Pen Generic drug: insulin degludec Inject 20 Units into the skin at bedtime.       Allergies:  Allergies  Allergen Reactions  . Zolpidem Tartrate Other (See Comments)    disorientation     Family History: Family History  Problem Relation Age of Onset  . Diabetes Mother   . Hypertension Mother   . Cancer Father        lung  . Cancer Sister        breast  . Sickle cell anemia Daughter   . Cancer Maternal Grandfather        prostate  . Colon cancer Neg Hx     Social History:  reports that he has never smoked. He has never used smokeless tobacco. He reports that he does not drink alcohol and does not use  drugs.  ROS: All other review of systems were reviewed and are negative except what is noted above in HPI  Physical Exam: BP 117/70   Pulse 97   Temp 97.8 F (36.6 C)   Ht 6' (1.829 m)   Wt 248 lb (112.5 kg)   BMI 33.63 kg/m     Laboratory Data: Lab Results  Component Value Date   WBC 5.2 03/16/2020   HGB 11.5 (L) 03/16/2020   HCT 35.5 (L) 03/16/2020   MCV 90.3 03/16/2020   PLT 252 03/16/2020  Lab Results  Component Value Date   CREATININE 1.6 (A) 06/29/2020    Lab Results  Component Value Date   PSA <0.1 03/16/2020   PSA 6.8 (H) 01/03/2017    Lab Results  Component Value Date   TESTOSTERONE 12 (L) 03/16/2020    Lab Results  Component Value Date   HGBA1C 6.7 (A) 08/06/2020    Urinalysis    Component Value Date/Time   COLORURINE STRAW (A) 03/21/2019 1500   APPEARANCEUR Clear 09/17/2020 0921   LABSPEC 1.009 03/21/2019 1500   PHURINE 6.0 03/21/2019 1500   GLUCOSEU Negative 09/17/2020 0921   HGBUR NEGATIVE 03/21/2019 1500   BILIRUBINUR Negative 09/17/2020 0921   KETONESUR NEGATIVE 03/21/2019 1500   PROTEINUR Negative 09/17/2020 0921   PROTEINUR NEGATIVE 03/21/2019 1500   UROBILINOGEN 0.2 04/12/2015 0224   NITRITE Negative 09/17/2020 0921   NITRITE NEGATIVE 03/21/2019 1500   LEUKOCYTESUR 2+ (A) 09/17/2020 0921   LEUKOCYTESUR LARGE (A) 03/21/2019 1500    Lab Results  Component Value Date   LABMICR See below: 09/17/2020   WBCUA >30 (A) 09/17/2020   LABEPIT None seen 09/17/2020   MUCUS neg 10/27/2013   BACTERIA Many (A) 09/17/2020    Pertinent Imaging:  No results found for this or any previous visit. No results found for this or any previous visit. No results found for this or any previous visit. No results found for this or any previous visit. Results for orders placed during the hospital encounter of 10/31/13  US Renal   Narrative CLINICAL DATA:  Elevated serum creatinine. Hypertension and diabetes.  EXAM: RENAL/URINARY TRACT  ULTRASOUND COMPLETE  COMPARISON:  08/22/2006  FINDINGS: Right Kidney: 10.1 cm. Slightly increased echogenicity and mild renal cortical thinning. No hydronephrosis.  Left Kidney: 10.5 cm. Slightly increased echogenicity with mild renal cortical thinning. No hydronephrosis.  Bladder:  Within normal limits.  Bilateral ureteric jets identified.  IMPRESSION: 1.  No hydronephrosis. 2. Findings of medical renal disease, including increased echogenicity and mildly decreased cortical thickness.   Electronically Signed   By: Abigail Miyamoto M.D.   On: 10/31/2013 15:06    No results found for this or any previous visit. No results found for this or any previous visit. No results found for this or any previous visit.  Assessment & Plan:    1. Prostate cancer ([place]) -Eligard 45mg  q71mo -RTC 6 months with PSA nand testosterone -  2. Urge incontinence -continue mirabegron 25mg  qod  3. Nocturia -continue mirabegron  4. Possible UTI.   -Urine cultured.    Return in about 6 months (around 03/17/2021) for with labs for Eligard.  Irine Seal, MD  [facility]

## 2020-09-17 ENCOUNTER — Other Ambulatory Visit: Payer: Self-pay

## 2020-09-17 ENCOUNTER — Ambulatory Visit (INDEPENDENT_AMBULATORY_CARE_PROVIDER_SITE_OTHER): Payer: Medicare Other | Admitting: Urology

## 2020-09-17 VITALS — BP 117/70 | HR 97 | Temp 97.8°F | Ht 72.0 in | Wt 248.0 lb

## 2020-09-17 DIAGNOSIS — N3941 Urge incontinence: Secondary | ICD-10-CM

## 2020-09-17 DIAGNOSIS — C61 Malignant neoplasm of prostate: Secondary | ICD-10-CM | POA: Diagnosis not present

## 2020-09-17 DIAGNOSIS — N390 Urinary tract infection, site not specified: Secondary | ICD-10-CM | POA: Diagnosis not present

## 2020-09-17 LAB — URINALYSIS, ROUTINE W REFLEX MICROSCOPIC
Bilirubin, UA: NEGATIVE
Glucose, UA: NEGATIVE
Ketones, UA: NEGATIVE
Nitrite, UA: NEGATIVE
Protein,UA: NEGATIVE
Specific Gravity, UA: 1.02 (ref 1.005–1.030)
Urobilinogen, Ur: 0.2 mg/dL (ref 0.2–1.0)
pH, UA: 6 (ref 5.0–7.5)

## 2020-09-17 LAB — MICROSCOPIC EXAMINATION
Epithelial Cells (non renal): NONE SEEN /hpf (ref 0–10)
Renal Epithel, UA: NONE SEEN /hpf
WBC, UA: >30 /hpf — AB (ref 0–5)

## 2020-09-17 MED ORDER — LEUPROLIDE ACETATE (6 MONTH) 45 MG IM KIT
45.0000 mg | PACK | Freq: Once | INTRAMUSCULAR | Status: AC
  Administered 2020-09-17: 45 mg via INTRAMUSCULAR

## 2020-09-17 NOTE — Progress Notes (Signed)

## 2020-09-21 LAB — URINE CULTURE

## 2020-09-22 ENCOUNTER — Ambulatory Visit: Payer: Medicare Other | Admitting: Urology

## 2020-09-22 ENCOUNTER — Telehealth: Payer: Self-pay

## 2020-09-22 DIAGNOSIS — N390 Urinary tract infection, site not specified: Secondary | ICD-10-CM

## 2020-09-22 MED ORDER — CEPHALEXIN 500 MG PO CAPS: 500.0000 mg | ORAL_CAPSULE | Freq: Three times a day (TID) | ORAL | 0 refills | Status: AC

## 2020-09-22 NOTE — Telephone Encounter (Signed)
-----   Message from Irine Seal, MD sent at 09/22/2020  8:23 AM EST ----- Keflex 500mg  po tid #21.

## 2020-09-22 NOTE — Telephone Encounter (Signed)
ABT sent in and pt made aware.

## 2020-10-25 NOTE — Progress Notes (Signed)
Referring Provider: Alycia Rossetti, MD Primary Care Physician:  Alycia Rossetti, MD Primary GI Physician: Dr. Abbey Chatters  Chief Complaint  Patient presents with  . Rectal Bleeding    Light red once in a while    HPI:   [name] is a 69 y.o. male with a history of radiation proctitis, rectal bleeding, and adenomatous colon polyps. He has flex sig with APC treatment of radiation proctitis on 10/27/19 (actively bleeding during exam). He had APC at time of colonoscopy in 10/2018 as well. Next TCS due in 2025. Also with history of anemia likely secondary to chronic disease. Hemoglobin 11.5 in May 2021, consistent with baseline. Iron panel within normal limits in May 2021.  He presents today for follow-up.  Here last seen in our office 04/26/2020.  Stated rectal bleeding continued to get better and better.  Occurring maybe once every 2 weeks with small amount on toilet tissue.  No rectal pain.  BMs daily without constipation or diarrhea.  No NSAIDs.  No other significant GI symptoms. Advised to continue to monitor rectal bleeding and follow-up in 6 months. Call with any worsening symptoms prior.   Today:   Rectal Bleeding: Continues to improve. Less than once every couple of weeks. States it is so infrequent, he doesn't keep up with it. When it occurs, just a little on toilet tissues. No constipation. No melena. No NSAIDs.   Has follow-up with PCP in January and will have routine labs completed.   No abdominal pain. No GERD, dysphagia, nausea, or vomiting.   Received COVID booster yesterday.   Past Medical History:  Diagnosis Date  . Arthritis   . Cerebrovascular disease    MRI shows Right carotid inferior cavernours narrowing 75% and  50-75% stenosis of Cavernous and supraclinoi right side  . CKD (chronic kidney disease) 06/28/2014   Sees Dr Florene Glen  . Diabetic retinopathy ([place])   . Hyperlipidemia   . Hypertension   . Myocardial infarction ([place])    "mild" heart attack  .  Necrosis ([place])    #2 nail   . Pneumonia    as a child  . Poor circulation of extremity   . Prostate cancer ([place]) 2017   Prostate  . Sleep apnea    uses cpap, getting a new one  . Type 2 Diabetes mellitus    Type 2    Past Surgical History:  Procedure Laterality Date  . ABOVE KNEE LEG AMPUTATION Left 2004   started out below knee and then extended to above knee due to poor healing  . AMPUTATION Right 04/28/2015   Procedure: AMPUTATION RAY, RIGHT 5TH TOE;  Surgeon: Marybelle Killings, MD;  Location: [facility];  Service: Orthopedics;  Laterality: Right;  . AMPUTATION Right 05/10/2015   Procedure: Right Below Knee Amputation;  Surgeon: Marybelle Killings, MD;  Location: [facility];  Service: Orthopedics;  Laterality: Right;  . CATARACT EXTRACTION W/PHACO  09/05/2012   Procedure: CATARACT EXTRACTION PHACO AND INTRAOCULAR LENS PLACEMENT (IOC);  Surgeon: Tonny Branch, MD;  [facility];  Service: Ophthalmology;  Laterality: Left;  CDE=5.45  . CATARACT EXTRACTION W/PHACO  10/03/2012   Procedure: CATARACT EXTRACTION PHACO AND INTRAOCULAR LENS PLACEMENT (IOC);  Surgeon: Tonny Branch, MD;  [facility];  Service: Ophthalmology;  Laterality: Right;  CDE: 12.31  . COLONOSCOPY  2011   Dr. Deatra Ina: colon polyps, tubular adenoma  . COLONOSCOPY N/A 11/01/2018   Dr. Gala Romney: Blood noted in the rectal vault.  Vascular  pattern of the rectum abnormal, neovascular changes distally and actively oozing.  There were 3 2 to 5 mm polyps in the splenic flexure, cecum removed.  Cecal polyp was not recovered.  Radiation proctitis status post APC treatment.  The splenic flexure polyps were tubular adenomas.  Next colonoscopy in 5 years.  Marland Kitchen FLEXIBLE SIGMOIDOSCOPY N/A 10/27/2019   Procedure: FLEXIBLE SIGMOIDOSCOPY;  Surgeon: Danie Binder, MD; rectal bleeding due to radiation proctitis s/p APC therapy (actively bleeding during exam), rectosigmoid colon and sigmoid colon appeared normal.  . POLYPECTOMY  11/01/2018   Procedure: POLYPECTOMY;   Surgeon: Daneil Dolin, MD;  [facility];  Service: Endoscopy;;  cecum,splenic flexure    Current Outpatient Medications  Medication Sig Dispense Refill  . acetaminophen (TYLENOL) 325 MG tablet Take 600 mg by mouth daily as needed for mild pain, moderate pain or headache.     Marland Kitchen atenolol (TENORMIN) 25 MG tablet TAKE 1 TABLET BY MOUTH EVERY DAY 90 tablet 1  . atorvastatin (LIPITOR) 80 MG tablet TAKE 1 TABLET BY MOUTH EVERY DAY 90 tablet 1  . B-D UF III MINI PEN NEEDLES 31G X 5 MM MISC USE FOUR TIMES DAILY AS DIRECTED 150 each 2  . Calcium Carb-Cholecalciferol 600-500 MG-UNIT CAPS Take 2 capsules by mouth daily.     . clopidogrel (PLAVIX) 75 MG tablet TAKE 1 TABLET BY MOUTH EVERY DAY 90 tablet 1  . CONTOUR TEST test strip TEST TWICE DAILY E11.65 200 each 2  . furosemide (LASIX) 20 MG tablet Take 20 mg by mouth 3 (three) times a week.    Marland Kitchen glucose blood (CONTOUR NEXT TEST) test strip Use as instructed bid. E11.65 100 each 5  . glucose blood test strip 1 each by Other route 2 (two) times daily. Use as instructed bid. E11.65 Contour next 300 each 2  . insulin degludec (TRESIBA FLEXTOUCH) 100 UNIT/ML SOPN FlexTouch Pen Inject 20 Units into the skin at bedtime.    . mirabegron ER (MYRBETRIQ) 25 MG TB24 tablet Take 1 tablet (25 mg total) by mouth daily. (Patient taking differently: Take 25 mg by mouth every other day.) 30 tablet 0  . Semaglutide 0.25 or 0.5 MG/DOSE SOPN Inject 0.5 mg into the skin every Monday.      No current facility-administered medications for this visit.    Allergies as of 10/27/2020 - Review Complete 10/27/2020  Allergen Reaction Noted  . Zolpidem tartrate Other (See Comments) 04/11/2011    Family History  Problem Relation Age of Onset  . Diabetes Mother   . Hypertension Mother   . Cancer Father        lung  . Cancer Sister        breast  . Sickle cell anemia Daughter   . Cancer Maternal Grandfather        prostate  . Colon cancer Neg Hx     Social  History   Socioeconomic History  . Marital status: Married    Spouse name: Not on file  . Number of children: 2  . Years of education: college  . Highest education level: Not on file  Occupational History  . Occupation: Mining engineer: [employer]  Tobacco Use  . Smoking status: Never Smoker  . Smokeless tobacco: Never Used  Vaping Use  . Vaping Use: Never used  Substance and Sexual Activity  . Alcohol use: No  . Drug use: No  . Sexual activity: Never  Other Topics Concern  .  Not on file  Social History Narrative   Patient lives with his wife    Patient right handed   Patient drinks caffine on occ.   Social Determinants of Health   Financial Resource Strain: Not on file  Food Insecurity: Not on file  Transportation Needs: Not on file  Physical Activity: Not on file  Stress: Not on file  Social Connections: Not on file    Review of Systems: Gen: Denies fever, chills, cold or flulike symptoms, presyncope, syncope. CV: Denies chest pain or palpitations. Resp: Denies dyspnea or cough. GI: See HPI Heme: See HPI  Physical Exam: BP 98/65   Pulse 94   Temp (!) 97.1 F (36.2 C) (Temporal)   Ht 6' (1.829 m)   Wt 251 lb 9.6 oz (114.1 kg)   BMI 34.12 kg/m  General:   Alert and oriented. No distress noted. Pleasant and cooperative.  Walking with a walker due to bilateral LE prosthesis. Head:  Normocephalic and atraumatic. Eyes:  Conjuctiva clear without scleral icterus. Heart:  S1, S2 present without murmurs appreciated. Lungs:  Clear to auscultation bilaterally. No wheezes, rales, or rhonchi. No distress.  Abdomen:  +BS, soft, non-tender and non-distended. No rebound or guarding. No HSM or masses noted.  Exam somewhat limited due to being examined in chair. Extremities:   Left below the knee amputation right above-knee amputation with bilateral prosthesis in place. Neurologic:  Alert and  oriented x4 Psych: Normal mood and affect.

## 2020-10-27 ENCOUNTER — Ambulatory Visit (INDEPENDENT_AMBULATORY_CARE_PROVIDER_SITE_OTHER): Payer: Medicare Other | Admitting: Gastroenterology

## 2020-10-27 ENCOUNTER — Encounter: Payer: Self-pay | Admitting: Gastroenterology

## 2020-10-27 ENCOUNTER — Other Ambulatory Visit: Payer: Self-pay

## 2020-10-27 VITALS — BP 98/65 | HR 94 | Temp 97.1°F | Ht 72.0 in | Wt 251.6 lb

## 2020-10-27 DIAGNOSIS — K627 Radiation proctitis: Secondary | ICD-10-CM

## 2020-10-27 DIAGNOSIS — K625 Hemorrhage of anus and rectum: Secondary | ICD-10-CM

## 2020-10-27 NOTE — Progress Notes (Signed)
Cc'ed to pcp °

## 2020-10-27 NOTE — Assessment & Plan Note (Signed)
Rectal bleeding secondary to known radiation proctitis. APC therapy in January 2020 and December 2020. He continues to have significant improvement in rectal bleeding.  Reports low volume tissue hematochezia very rarely.  No significant upper or lower GI symptoms.  He is due for repeat colonoscopy in 2025.   Advised he continue to monitor rectal bleeding and let us know of any worsening. Otherwise, we will follow-up in 1 year.

## 2020-10-27 NOTE — Patient Instructions (Signed)
Continue to monitor rectal bleeding and let me know of any worsening.   We will plan to see you back in 1 year. Do not hesitate to call with questions or concerns prior.   It was good to see you today! Have a Happy New Year!   Aliene Altes, PA-C [facility]

## 2020-10-27 NOTE — Assessment & Plan Note (Signed)
Addressed under rectal bleeding 

## 2020-11-02 ENCOUNTER — Other Ambulatory Visit: Payer: Self-pay | Admitting: Family Medicine

## 2020-11-09 ENCOUNTER — Other Ambulatory Visit: Payer: Self-pay | Admitting: Family Medicine

## 2020-11-23 ENCOUNTER — Ambulatory Visit: Payer: Medicare Other | Admitting: Family Medicine

## 2020-11-29 ENCOUNTER — Other Ambulatory Visit: Payer: Self-pay

## 2020-11-29 ENCOUNTER — Ambulatory Visit (INDEPENDENT_AMBULATORY_CARE_PROVIDER_SITE_OTHER): Payer: Medicare Other | Admitting: Family Medicine

## 2020-11-29 ENCOUNTER — Encounter: Payer: Self-pay | Admitting: Family Medicine

## 2020-11-29 VITALS — BP 138/90 | HR 95 | Temp 97.2°F | Resp 18 | Wt 252.0 lb

## 2020-11-29 DIAGNOSIS — R52 Pain, unspecified: Secondary | ICD-10-CM | POA: Diagnosis not present

## 2020-11-29 DIAGNOSIS — I10 Essential (primary) hypertension: Secondary | ICD-10-CM | POA: Diagnosis not present

## 2020-11-29 DIAGNOSIS — Z89612 Acquired absence of left leg above knee: Secondary | ICD-10-CM

## 2020-11-29 DIAGNOSIS — E1159 Type 2 diabetes mellitus with other circulatory complications: Secondary | ICD-10-CM | POA: Diagnosis not present

## 2020-11-29 DIAGNOSIS — N182 Chronic kidney disease, stage 2 (mild): Secondary | ICD-10-CM

## 2020-11-29 DIAGNOSIS — Z89511 Acquired absence of right leg below knee: Secondary | ICD-10-CM | POA: Diagnosis not present

## 2020-11-29 DIAGNOSIS — R2681 Unsteadiness on feet: Secondary | ICD-10-CM | POA: Diagnosis not present

## 2020-11-29 MED ORDER — ACETAMINOPHEN 500 MG PO TABS
1000.0000 mg | ORAL_TABLET | Freq: Once | ORAL | Status: AC
  Administered 2020-11-29: 1000 mg via ORAL
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
## 2020-11-29 NOTE — Patient Instructions (Signed)
Wheelchair prescription sent to pharmacy

## 2020-11-29 NOTE — Assessment & Plan Note (Signed)
Patient has been well controlled he is followed by endocrinology.  Will defer management to them.
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
## 2020-11-29 NOTE — Assessment & Plan Note (Signed)
Followed by nephrology.  He does need a recheck on his CBC today does have history of rectal bleeding proctitis which he was seen by GI for a couple months ago.  They recommend repeating CBC at today's visit.

## 2020-11-30 LAB — BASIC METABOLIC PANEL
BUN/Creatinine Ratio: 15 (calc) (ref 6–22)
BUN: 25 mg/dL (ref 7–25)
CO2: 26 mmol/L (ref 20–32)
Calcium: 9.2 mg/dL (ref 8.6–10.3)
Chloride: 106 mmol/L (ref 98–110)
Creat: 1.7 mg/dL — ABNORMAL HIGH (ref 0.70–1.25)
Glucose, Bld: 186 mg/dL — ABNORMAL HIGH (ref 65–99)
Potassium: 4.5 mmol/L (ref 3.5–5.3)
Sodium: 142 mmol/L (ref 135–146)

## 2020-11-30 LAB — CBC WITH DIFFERENTIAL/PLATELET
Absolute Monocytes: 352 cells/uL (ref 200–950)
Basophils Absolute: 31 cells/uL (ref 0–200)
Basophils Relative: 0.6 %
Eosinophils Absolute: 138 cells/uL (ref 15–500)
Eosinophils Relative: 2.7 %
HCT: 34.9 % — ABNORMAL LOW (ref 38.5–50.0)
Hemoglobin: 11.5 g/dL — ABNORMAL LOW (ref 13.2–17.1)
Lymphs Abs: 1352 cells/uL (ref 850–3900)
MCH: 29.7 pg (ref 27.0–33.0)
MCHC: 33 g/dL (ref 32.0–36.0)
MCV: 90.2 fL (ref 80.0–100.0)
MPV: 9.6 fL (ref 7.5–12.5)
Monocytes Relative: 6.9 %
Neutro Abs: 3228 cells/uL (ref 1500–7800)
Neutrophils Relative %: 63.3 %
Platelets: 203 10*3/uL (ref 140–400)
RBC: 3.87 10*6/uL — ABNORMAL LOW (ref 4.20–5.80)
RDW: 13.9 % (ref 11.0–15.0)
Total Lymphocyte: 26.5 %
WBC: 5.1 10*3/uL (ref 3.8–10.8)

## 2020-12-01 ENCOUNTER — Telehealth: Payer: Self-pay | Admitting: Pharmacist

## 2020-12-01 NOTE — Progress Notes (Addendum)
Chronic Care Management Pharmacy Assistant   Name: [name]  MRN: [id number] DOB: [birth year]  Reason for Encounter: Disease State For DM.  Patient Questions:  1.  Have you seen any other providers since your last visit? Yes.   2.  Any changes in your medicines or health? Yes.  PCP : Alycia Rossetti, MD   Their chronic conditions include: Type II DM, hypertension, CKD, hyperlipidemia.  Office Visits: 11/29/20 Dr. Buelah Manis. Labs drawn. No medication changes.  Consults: 10/27/20 Maurene Capes, Yolanda Manges, PA-C. F/u on rectal bleeding, no changes. No medication changes. 09/22/20 Urology (Telephone) STARTED Cephalexin 500 mg 3 times daily for 7 days 09/17/20 Urology Irine Seal, MD. No medication changes.  Allergies:   Allergies  Allergen Reactions   Zolpidem Tartrate Other (See Comments)    disorientation     Medications: Outpatient Encounter Medications as of 12/01/2020  Medication Sig   acetaminophen (TYLENOL) 325 MG tablet Take 600 mg by mouth daily as needed for mild pain, moderate pain or headache.    atenolol (TENORMIN) 25 MG tablet TAKE 1 TABLET BY MOUTH EVERY DAY   atorvastatin (LIPITOR) 80 MG tablet TAKE 1 TABLET BY MOUTH EVERY DAY   B-D UF III MINI PEN NEEDLES 31G X 5 MM MISC USE FOUR TIMES DAILY AS DIRECTED   Calcium Carb-Cholecalciferol 600-500 MG-UNIT CAPS Take 2 capsules by mouth daily.    clopidogrel (PLAVIX) 75 MG tablet TAKE 1 TABLET BY MOUTH EVERY DAY   CONTOUR TEST test strip TEST TWICE DAILY E11.65   furosemide (LASIX) 20 MG tablet Take 20 mg by mouth 3 (three) times a week.   glucose blood (CONTOUR NEXT TEST) test strip Use as instructed bid. E11.65   glucose blood test strip 1 each by Other route 2 (two) times daily. Use as instructed bid. E11.65 Contour next   insulin degludec (TRESIBA FLEXTOUCH) 100 UNIT/ML SOPN FlexTouch Pen Inject 20 Units into the skin at bedtime.   mirabegron ER (MYRBETRIQ) 25 MG TB24 tablet Take 1 tablet (25 mg total) by mouth  daily. (Patient taking differently: Take 25 mg by mouth every other day.)   Semaglutide 0.25 or 0.5 MG/DOSE SOPN Inject 0.5 mg into the skin every Monday.    No facility-administered encounter medications on file as of 12/01/2020.    Current Diagnosis: Patient Active Problem List   Diagnosis Date Noted   Synovitis of right knee 09/10/2020   Anemia 04/26/2020   Type 2 diabetes mellitus with stage 3b chronic kidney disease, with long-term current use of insulin ([place]) 03/31/2020   Nocturia 03/19/2020   Urge incontinence 03/19/2020   Diabetic retinopathy ([place]) 02/24/2020   Radiation proctitis 02/03/2019   Rectal bleeding 09/02/2018   Hx of adenomatous colonic polyps 09/02/2018   Gait instability 07/10/2017   Prostate cancer ([place]) 06/08/2017   Disorder of bursa 05/22/2016   Status post below knee amputation of right lower extremity ([place]) 05/14/2015   History of left above knee amputation ([place]) 05/14/2015   OSA (obstructive sleep apnea) 02/23/2015   CKD (chronic kidney disease) stage 2, GFR 60-89 ml/min 06/28/2014   Class 1 obesity 08/17/2010   DM type 2 causing vascular disease ([place]) 08/11/2010   Mixed hyperlipidemia 08/11/2010   Essential hypertension 08/11/2010    Goals Addressed   None    Recent Relevant Labs: Lab Results  Component Value Date/Time   HGBA1C 6.7 (A) 08/06/2020 09:48 AM   HGBA1C 6.0 (H) 11/26/2019 03:20 PM   HGBA1C 6.1 (H) 08/26/2019 12:36  PM   MICROALBUR 30 08/06/2020 09:48 AM   MICROALBUR 0.2 10/31/2016 11:37 AM    Kidney Function Lab Results  Component Value Date/Time   CREATININE 1.70 (H) 11/29/2020 01:20 PM   CREATININE 1.6 (A) 06/29/2020 12:00 AM   CREATININE 1.61 (H) 02/24/2020 09:15 AM   GFRNONAA 44 06/29/2020 12:00 AM   GFRNONAA 58 (L) 11/26/2019 03:20 PM   GFRAA 50 06/29/2020 12:00 AM   GFRAA 67 11/26/2019 03:20 PM    Current antihyperglycemic regimen:  Ozempic 0.5mg  SQ every Monday Tresiba 100u/ml 20 units SQ hs  What recent  interventions/DTPs have been made to improve glycemic control:  None.  Have there been any recent hospitalizations or ED visits since last visit with CPP? No.   Patient reports hypoglycemic symptoms, including None   Patient reports hyperglycemic symptoms, including none   How often are you checking your blood sugar? twice daily   What are your blood sugars ranging? Patient stated he doesn't write them down. He stated normally in the morning his readings are around 120-125 and before bed his reading are around 145-155 Fasting: N/A Before meals: N/A After meals: N/A Bedtime: N/A  During the week, how often does your blood glucose drop below 70? Patient stated Never .  Are you checking your feet daily/regularly?  Patient stated he is a double amputee.    Adherence Review: Is the patient currently on a STATIN medication? Yes, Atorvastatin 80 mg.  Is the patient currently on ACE/ARB medication? No.  Does the patient have >5 day gap between last estimated fill dates? No   Follow-Up:  Pharmacist Review   Charlann Lange, RMA Clinical Pharmacist Assistant [phone]  10 minutes spent in review, coordination, and documentation.  Reviewed by: Beverly Milch, PharmD Clinical Pharmacist [facility] [phone]

## 2020-12-09 ENCOUNTER — Telehealth: Payer: Self-pay | Admitting: *Deleted

## 2020-12-09 NOTE — Telephone Encounter (Signed)
Received call from patient.   Reports that he requires new order for standard walker from [facility]. States that he had an accidental fall and bent current walker. Of note, no injuries were obtained from fall.   Order transcribed and awaiting signature.

## 2020-12-10 NOTE — Telephone Encounter (Signed)
Orders written

## 2020-12-17 ENCOUNTER — Telehealth: Payer: Self-pay

## 2020-12-17 NOTE — Telephone Encounter (Signed)
Advise to increase his Tresiba to 30 units ( or by 10 units qhs). Call back if fasting BG < 70 or > 200 x 3 days ina  week.

## 2020-12-17 NOTE — Telephone Encounter (Signed)
Patient called with the following BG readings, 2/18 am 157, 2/17 am 140, pm 171, 2/16 am 202, pm 212, 2/15 am 212. Patient stated no changes with diet or anything, did get a new meter. Please advise.

## 2020-12-17 NOTE — Telephone Encounter (Signed)
Patient notified verbalized understanding

## 2020-12-24 ENCOUNTER — Telehealth: Payer: Self-pay | Admitting: *Deleted

## 2020-12-24 NOTE — Telephone Encounter (Signed)
Received call from patient.   Requested prescription for wheelchair to be sent to [facility].   Re-sent prescription from earlier in month.

## 2020-12-27 ENCOUNTER — Telehealth: Payer: Self-pay

## 2020-12-27 NOTE — Telephone Encounter (Signed)
Made pt aware we did not have samples of either medications but we would contact him if we were to receive them. Pt voiced understanding and stated he is unable to afford Rx at pharmacy at this time. Pt also requested a sample of tresiba which was given to him.

## 2020-12-27 NOTE — Telephone Encounter (Signed)
What can this patient take in place of Ozempic? He is asking for samples but we are out.

## 2020-12-27 NOTE — Telephone Encounter (Signed)
Trulicity 1.5 mg, if we have any.

## 2020-12-29 DIAGNOSIS — C61 Malignant neoplasm of prostate: Secondary | ICD-10-CM | POA: Diagnosis not present

## 2021-01-27 ENCOUNTER — Telehealth: Payer: Self-pay | Admitting: Pharmacist

## 2021-01-27 ENCOUNTER — Ambulatory Visit (INDEPENDENT_AMBULATORY_CARE_PROVIDER_SITE_OTHER): Payer: Medicare Other | Admitting: Pharmacist

## 2021-01-27 DIAGNOSIS — Z794 Long term (current) use of insulin: Secondary | ICD-10-CM | POA: Diagnosis not present

## 2021-01-27 DIAGNOSIS — N1832 Chronic kidney disease, stage 3b: Secondary | ICD-10-CM | POA: Diagnosis not present

## 2021-01-27 DIAGNOSIS — E782 Mixed hyperlipidemia: Secondary | ICD-10-CM

## 2021-01-27 DIAGNOSIS — E1122 Type 2 diabetes mellitus with diabetic chronic kidney disease: Secondary | ICD-10-CM

## 2021-01-27 NOTE — Progress Notes (Addendum)
    Chronic Care Management Pharmacy Assistant   Name: [name]  MRN: [id number] DOB: 07-10-1951  Reason for Encounter: Adherence Review  Verified Adherence Gap Information. Per insurance data patient has met their colorectal cancer screening as well as their wellness bundle and annual wellness has been performed.Their most recent A1C was 6.7 on 08/06/20. The patient met goal by keeping their A1C less than 9. Their most recent blood pressure was 120/80 on  08/27/20. The patient met goal by keeping their blood pressure lower than 140/90.  Follow-Up:Pharmacist Review  Charlann Lange, [place] Pharmacist Assistant [phone]

## 2021-01-27 NOTE — Progress Notes (Signed)
Chronic Care Management Pharmacy Note  01/27/2021 Name:  [name] MRN:  [id number] DOB:  09/10/1951  Subjective: [name] is an 70 y.o. year old male who is a primary patient of [place], Modena Nunnery, MD.  The CCM team was consulted for assistance with disease management and care coordination needs.    Engaged with patient by telephone for follow up visit in response to provider referral for pharmacy case management and/or care coordination services.   Consent to Services:  The patient was given the following information about Chronic Care Management services today, agreed to services, and gave verbal consent: 1. CCM service includes personalized support from designated clinical staff supervised by the primary care provider, including individualized plan of care and coordination with other care providers 2. 24/7 contact phone numbers for assistance for urgent and routine care needs. 3. Service will only be billed when office clinical staff spend 20 minutes or more in a month to coordinate care. 4. Only one practitioner may furnish and bill the service in a calendar month. 5.The patient may stop CCM services at any time (effective at the end of the month) by phone call to the office staff. 6. The patient will be responsible for cost sharing (co-pay) of up to 20% of the service fee (after annual deductible is met). Patient agreed to services and consent obtained.  Patient Care Team: [facility], Modena Nunnery, MD as PCP - General (Family Medicine) Edythe Clarity, [facility] as Pharmacist (Pharmacist) Eloise Harman, DO as Consulting Physician (Internal Medicine) Eloise Harman, DO as Consulting Physician (Gastroenterology)     Hospital visits: None in previous 6 months  Objective:  Lab Results  Component Value Date   CREATININE 1.70 (H) 11/29/2020   BUN 25 11/29/2020   GFRNONAA 44 06/29/2020   GFRAA 50 06/29/2020   NA 142 11/29/2020   K 4.5 11/29/2020   CALCIUM 9.2 11/29/2020   CO2 26  11/29/2020   GLUCOSE 186 (H) 11/29/2020    Lab Results  Component Value Date/Time   HGBA1C 6.7 (A) 08/06/2020 09:48 AM   HGBA1C 6.0 (H) 11/26/2019 03:20 PM   HGBA1C 6.1 (H) 08/26/2019 12:36 PM   MICROALBUR 30 08/06/2020 09:48 AM   MICROALBUR 0.2 10/31/2016 11:37 AM    Last diabetic Eye exam:  Lab Results  Component Value Date/Time   HMDIABEYEEXA Retinopathy (A) 09/29/2019 01:54 PM    Last diabetic Foot exam: No results found for: HMDIABFOOTEX   Lab Results  Component Value Date   CHOL 97 02/24/2020   HDL 32 (L) 02/24/2020   LDLCALC 50 02/24/2020   TRIG 74 02/24/2020   CHOLHDL 3.0 02/24/2020    Hepatic Function Latest Ref Rng & Units 06/29/2020 02/24/2020 11/26/2019  Total Protein 6.1 - 8.1 g/dL - 6.0(L) 6.4  Albumin 3.5 - 5.0 3.8 - -  AST 10 - 35 U/L - 21 27  ALT 9 - 46 U/L - 27 34  Alk Phosphatase 40 - 115 U/L - - -  Total Bilirubin 0.2 - 1.2 mg/dL - 0.4 0.3    Lab Results  Component Value Date/Time   TSH 2.00 10/31/2016 11:37 AM   FREET4 1.2 10/31/2016 11:37 AM    CBC Latest Ref Rng & Units 11/29/2020 03/16/2020 02/24/2020  WBC 3.8 - 10.8 Thousand/uL 5.1 5.2 4.9  Hemoglobin 13.2 - 17.1 g/dL 11.5(L) 11.5(L) 11.3(L)  Hematocrit 38.5 - 50.0 % 34.9(L) 35.5(L) 34.7(L)  Platelets 140 - 400 Thousand/uL 203 252 254    Lab  Results  Component Value Date/Time   VD25OH 37 10/31/2016 11:37 AM   VD25OH 23.9 (L) 06/03/2013 11:01 AM    Clinical ASCVD: No  The ASCVD Risk score Mikey Bussing DC Jr., et al., 2013) failed to calculate for the following reasons:   The valid total cholesterol range is 130 to 320 mg/dL    Depression screen [facility] 2/9 08/27/2020 02/24/2020 05/13/2018  Decreased Interest 0 0 0  Down, Depressed, Hopeless 0 0 0  PHQ - 2 Score 0 0 0  Altered sleeping - 0 -  Tired, decreased energy - 0 -  Change in appetite - 0 -  Feeling bad or failure about yourself  - 0 -  Trouble concentrating - 0 -  Moving slowly or fidgety/restless - 0 -  Suicidal thoughts - 0 -  PHQ-9  Score - 0 -  Difficult doing work/chores - Not difficult at all -  Some recent data might be hidden     Social History   Tobacco Use  Smoking Status Never Smoker  Smokeless Tobacco Never Used   BP Readings from Last 3 Encounters:  11/29/20 138/90  10/27/20 98/65  09/17/20 117/70   Pulse Readings from Last 3 Encounters:  11/29/20 95  10/27/20 94  09/17/20 97   Wt Readings from Last 3 Encounters:  11/29/20 252 lb (114.3 kg)  10/27/20 251 lb 9.6 oz (114.1 kg)  09/17/20 248 lb (112.5 kg)   BMI Readings from Last 3 Encounters:  11/29/20 34.18 kg/m  10/27/20 34.12 kg/m  09/17/20 33.63 kg/m    Assessment/Interventions: Review of patient past medical history, allergies, medications, health status, including review of consultants reports, laboratory and other test data, was performed as part of comprehensive evaluation and provision of chronic care management services.   SDOH:  (Social Determinants of Health) assessments and interventions performed: No  SDOH Screenings   Alcohol Screen: Low Risk   . Last Alcohol Screening Score (AUDIT): 0  Depression (PHQ2-9): Low Risk   . PHQ-2 Score: 0  Financial Resource Strain: Not on file  Food Insecurity: Not on file  Housing: Not on file  Physical Activity: Not on file  Social Connections: Not on file  Stress: Not on file  Tobacco Use: Low Risk   . Smoking Tobacco Use: Never Smoker  . Smokeless Tobacco Use: Never Used  Transportation Needs: Not on file    CCM Care Plan  Allergies  Allergen Reactions  . Zolpidem Tartrate Other (See Comments)    disorientation     Medications Reviewed Today    Reviewed by Edythe Clarity, [employer] [facility] (Pharmacist) on 01/27/21 at 7  Med List Status: <None>  Medication Order Taking? Sig Documenting Provider Last Dose Status Informant  acetaminophen (TYLENOL) 325 MG tablet [id number] Yes Take 600 mg by mouth daily as needed for mild pain, moderate pain or headache.  [provider]  Taking Active Self  atenolol (TENORMIN) 25 MG tablet [id number] Yes TAKE 1 TABLET BY MOUTH EVERY DAY [place], Modena Nunnery, MD Taking Active   atorvastatin (LIPITOR) 80 MG tablet [id number] Yes TAKE 1 TABLET BY MOUTH EVERY DAY [place], Modena Nunnery, MD Taking Active   B-D UF III MINI PEN NEEDLES 31G X 5 MM MISC [id number] Yes USE FOUR TIMES DAILY AS DIRECTED Cassandria Anger, MD Taking Active Self  Calcium Carb-Cholecalciferol 600-500 MG-UNIT CAPS [id number] Yes Take 2 capsules by mouth daily.  [provider] Taking Active Self  clopidogrel (PLAVIX) 75 MG tablet [id number] Yes TAKE 1 TABLET BY MOUTH  EVERY DAY [place], Modena Nunnery, MD Taking Active   CONTOUR TEST test strip [id number] Yes TEST TWICE DAILY E11.65 Cassandria Anger, MD Taking Active Self  furosemide (LASIX) 20 MG tablet [id number] Yes Take 20 mg by mouth 3 (three) times a week. [provider] Taking Active   glucose blood (CONTOUR NEXT TEST) test strip [id number] Yes Use as instructed bid. E11.65 Cassandria Anger, MD Taking Active Self  glucose blood test strip [id number] Yes 1 each by Other route 2 (two) times daily. Use as instructed bid. E11.65 Contour next Cassandria Anger, MD Taking Active Self  insulin degludec (TRESIBA FLEXTOUCH) 100 UNIT/ML SOPN FlexTouch Pen [id number] Yes Inject 20 Units into the skin at bedtime. [provider] Taking Active Self           Med Note Gentry Roch   Wed Oct 15, 2019  9:49 AM)    mirabegron ER (MYRBETRIQ) 25 MG TB24 tablet [id number] Yes Take 1 tablet (25 mg total) by mouth daily.  Patient taking differently: Take 25 mg by mouth every other day.   McKenzie, Candee [place]urb[place], MD Taking Active   Semaglutide 0.25 or 0.5 MG/DOSE [facility] [id number] Yes Inject 0.5 mg into the skin every Monday.  [provider] Taking Active Self          Patient Active Problem List   Diagnosis Date Noted  . Synovitis of right knee 09/10/2020  . Anemia 04/26/2020  . Type 2  diabetes mellitus with stage 3b chronic kidney disease, with long-term current use of insulin ([place]) 03/31/2020  . Nocturia 03/19/2020  . Urge incontinence 03/19/2020  . Diabetic retinopathy ([place]) 02/24/2020  . Radiation proctitis 02/03/2019  . Rectal bleeding 09/02/2018  . Hx of adenomatous colonic polyps 09/02/2018  . Gait instability 07/10/2017  . Prostate cancer ([place]) 06/08/2017  . Disorder of bursa 05/22/2016  . Status post below knee amputation of right lower extremity ([place]) 05/14/2015  . History of left above knee amputation ([place]) 05/14/2015  . OSA (obstructive sleep apnea) 02/23/2015  . CKD (chronic kidney disease) stage 2, GFR 60-89 ml/min 06/28/2014  . Class 1 obesity 08/17/2010  . DM type 2 causing vascular disease ([place]) 08/11/2010  . Mixed hyperlipidemia 08/11/2010  . Essential hypertension 08/11/2010    Immunization History  Administered Date(s) Administered  . Fluad Quad(high Dose 65+) 07/01/2019  . Influenza Split 06/30/2013, 08/30/2013, 08/23/2020  . Influenza-Unspecified 07/30/2015, 06/30/2018  . Moderna Sars-Covid-2 Vaccination 11/20/2019, 12/17/2019  . Pneumococcal Conjugate-13 01/08/2018  . Pneumococcal Polysaccharide-23 07/31/2007, 08/26/2019  . Td 10/30/2002  . Tdap 06/19/2016  . Zoster 06/19/2016    Conditions to be addressed/monitored:  Type II DM, hypertension, CKD, hyperlipidemia.  Care Plan : General Pharmacy (Adult)  Updates made by Edythe Clarity, RPH since 01/27/2021 12:00 AM    Problem: HTN, HLD, DM   Priority: High  Onset Date: 01/27/2021    Long-Range Goal: Patient-Specific Goal   Start Date: 01/27/2021  Expected End Date: 07/29/2021  This Visit's Progress: On track  Priority: High  Note:   Current Barriers:  . Unable to independently afford treatment regimen  Pharmacist Clinical Goal(s):  Marland Kitchen Patient will verbalize ability to afford treatment regimen . achieve control of blood sugar as evidenced by lab work . contact provider  office for questions/concerns as evidenced notation of same in electronic health record through collaboration with PharmD and provider.    Interventions: . 1:1 collaboration with Buelah Manis, Modena Nunnery, MD regarding development and update of comprehensive plan of care  as evidenced by provider attestation and co-signature . Inter-disciplinary care team collaboration (see longitudinal plan of care) . Comprehensive medication review performed; medication list updated in electronic medical record  Hyperlipidemia: (LDL goal < 70) -Controlled -Current treatment: . Atorvastatin 89m -Medications previously tried: Crestor 219m  -Educated on Cholesterol goals;  Benefits of statin for ASCVD risk reduction; -Recommended to continue current medication  Diabetes (A1c goal <7%) -Controlled -Current medications:  Ozempic 0.52m1mQ every Monday  Tresiba 100u/ml 20 units SQ hs -Medications previously tried: [place], Novolog, Toujeo -Current home glucose readings  -Educated on A1c and blood sugar goals; Exercise goal of 150 minutes per week; -Counseled to check feet daily and get yearly eye exams -Recommended to continue current medication Collaborated with [employer] assistance program.  Confirmed patient approved for both medications through 10/29/21.  They will ship to office in 10-14 business days.  Have asked Endo to send in new application since patient will be seeking primary care elsewhere.   Patient Goals/Self-Care Activities . Patient will:  - take medications as prescribed Get new application for PAP from endo.  Follow Up Plan: The care management team will reach out to the patient again over the next 180 days.          Medication Assistance: TreTyler Aasd Ozempicobtained through [employer] medication assistance program.  Enrollment ends 10/29/21  Patient's [facility] pharmacy [facility]:  [id number] [place] [id number]: [phone]: [phone] discussed: Benefits of medication synchronization, packaging and delivery as well as enhanced pharmacist oversight with [employer]. Patient decided to: Continue current medication management strategy  Care Plan and Follow Up Patient Decision:  Patient agrees to Care Plan and Follow-up.  Plan: The care management team will reach out to the patient again over the next 180 days.  ChriBeverly MilcharmD Clinical Pharmacist [phone]

## 2021-01-27 NOTE — Patient Instructions (Addendum)
Visit Information  Goals Addressed   None    There are no care plans to display for this patient.    The patient verbalized understanding of instructions, educational materials, and care plan provided today and agreed to receive a mailed copy of patient instructions, educational materials, and care plan.  Telephone follow up appointment with pharmacy team member scheduled for: 6 months  [name], [facility]  Dyslipidemia Dyslipidemia is an imbalance of waxy, fat-like substances (lipids) in the blood. The body needs lipids in small amounts. Dyslipidemia often involves a high level of cholesterol or triglycerides, which are types of lipids. Common forms of dyslipidemia include:  High levels of LDL cholesterol. LDL is the type of cholesterol that causes fatty deposits (plaques) to build up in the blood vessels that carry blood away from your heart (arteries).  Low levels of HDL cholesterol. HDL cholesterol is the type of cholesterol that protects against heart disease. High levels of HDL remove the LDL buildup from arteries.  High levels of triglycerides. Triglycerides are a fatty substance in the blood that is linked to a buildup of plaques in the arteries. What are the causes? Primary dyslipidemia is caused by changes (mutations) in genes that are passed down through families (inherited). These mutations cause several types of dyslipidemia. Secondary dyslipidemia is caused by lifestyle choices and diseases that lead to dyslipidemia, such as:  Eating a diet that is high in animal fat.  Not getting enough exercise.  Having diabetes, kidney disease, liver disease, or thyroid disease.  Drinking large amounts of alcohol.  Using certain medicines. What increases the risk? You are more likely to develop this condition if you are an older man or if you are a woman who has gone through menopause. Other risk factors include:  Having a family history of dyslipidemia.  Taking certain  medicines, including birth control pills, steroids, some diuretics, and beta-blockers.  Smoking cigarettes.  Eating a high-fat diet.  Having certain medical conditions such as diabetes, polycystic ovary syndrome (PCOS), kidney disease, liver disease, or hypothyroidism.  Not exercising regularly.  Being overweight or obese with too much belly fat. What are the signs or symptoms? In most cases, dyslipidemia does not usually cause any symptoms. In severe cases, very high lipid levels can cause:  Fatty bumps under the skin (xanthomas).  White or gray ring around the black center (pupil) of the eye. Very high triglyceride levels can cause inflammation of the pancreas (pancreatitis). How is this diagnosed? Your health care provider may diagnose dyslipidemia based on a routine blood test (fasting blood test). Because most people do not have symptoms of the condition, this blood testing (lipid profile) is done on adults age 62 and older and is repeated every 5 years. This test checks:  Total cholesterol. This measures the total amount of cholesterol in your blood, including LDL cholesterol, HDL cholesterol, and triglycerides. A healthy number is below 200.  LDL cholesterol. The target number for LDL cholesterol is different for each person, depending on individual risk factors. Ask your health care provider what your LDL cholesterol should be.  HDL cholesterol. An HDL level of 60 or higher is best because it helps to protect against heart disease. A number below 37 for men or below 17 for women increases the risk for heart disease.  Triglycerides. A healthy triglyceride number is below 150. If your lipid profile is abnormal, your health care provider may do other blood tests.   How is this treated? Treatment depends on  the type of dyslipidemia that you have and your other risk factors for heart disease and stroke. Your health care provider will have a target range for your lipid levels based  on this information. For many people, this condition may be treated by lifestyle changes, such as diet and exercise. Your health care provider may recommend that you:  Get regular exercise.  Make changes to your diet.  Quit smoking if you smoke. If diet changes and exercise do not help you reach your goals, your health care provider may also prescribe medicine to lower lipids. The most commonly prescribed type of medicine lowers your LDL cholesterol (statin drug). If you have a high triglyceride level, your provider may prescribe another type of drug (fibrate) or an omega-3 fish oil supplement, or both. Follow these instructions at home: Eating and drinking  Follow instructions from your health care provider or dietitian about eating or drinking restrictions.  Eat a healthy diet as told by your health care provider. This can help you reach and maintain a healthy weight, lower your LDL cholesterol, and raise your HDL cholesterol. This may include: ? Limiting your calories, if you are overweight. ? Eating more fruits, vegetables, whole grains, fish, and lean meats. ? Limiting saturated fat, trans fat, and cholesterol.  If you drink alcohol: ? Limit how much you use. ? Be aware of how much alcohol is in your drink. In the [place] one drink equals one 12 oz bottle of beer (355 mL), one 5 oz glass of wine (148 mL), or one 1 oz glass of hard liquor (44 mL).  Do not drink alcohol if: ? Your health care provider tells you not to drink. ? You are pregnant, may be pregnant, or are planning to become pregnant. Activity  Get regular exercise. Start an exercise and strength training program as told by your health care provider. Ask your health care provider what activities are safe for you. Your health care provider may recommend: ? 30 minutes of aerobic activity 4-6 days a week. Brisk walking is an example of aerobic activity. ? Strength training 2 days a week. General instructions  Do not use  any products that contain nicotine or tobacco, such as cigarettes, e-cigarettes, and chewing tobacco. If you need help quitting, ask your health care provider.  Take over-the-counter and prescription medicines only as told by your health care provider. This includes supplements.  Keep all follow-up visits as told by your health care provider.   Contact a health care provider if:  You are: ? Having trouble sticking to your exercise or diet plan. ? Struggling to quit smoking or control your use of alcohol. Summary  Dyslipidemia often involves a high level of cholesterol or triglycerides, which are types of lipids.  Treatment depends on the type of dyslipidemia that you have and your other risk factors for heart disease and stroke.  For many people, treatment starts with lifestyle changes, such as diet and exercise.  Your health care provider may prescribe medicine to lower lipids. This information is not intended to replace advice given to you by your health care provider. Make sure you discuss any questions you have with your health care provider. Document Revised: 06/10/2018 Document Reviewed: 05/17/2018 Elsevier Patient Education  [place].

## 2021-01-28 NOTE — Telephone Encounter (Signed)
Pt called wanting samples of ozempic and toujeo. Okay to give per joy.

## 2021-01-31 ENCOUNTER — Telehealth: Payer: Self-pay

## 2021-01-31 DIAGNOSIS — E559 Vitamin D deficiency, unspecified: Secondary | ICD-10-CM | POA: Diagnosis not present

## 2021-01-31 DIAGNOSIS — E1159 Type 2 diabetes mellitus with other circulatory complications: Secondary | ICD-10-CM | POA: Diagnosis not present

## 2021-01-31 NOTE — Telephone Encounter (Signed)
Pt came into office asking for samples of Myrbetriq 25 mgs. Pt was given 1 month of samples.

## 2021-02-01 LAB — T4, FREE: Free T4: 1.19 ng/dL (ref 0.82–1.77)

## 2021-02-01 LAB — COMPREHENSIVE METABOLIC PANEL
ALT: 64 IU/L — ABNORMAL HIGH (ref 0–44)
AST: 43 IU/L — ABNORMAL HIGH (ref 0–40)
Albumin/Globulin Ratio: 1.2 (ref 1.2–2.2)
Albumin: 3.4 g/dL — ABNORMAL LOW (ref 3.8–4.8)
Alkaline Phosphatase: 123 IU/L — ABNORMAL HIGH (ref 44–121)
BUN/Creatinine Ratio: 16 (ref 10–24)
BUN: 26 mg/dL (ref 8–27)
Bilirubin Total: 0.4 mg/dL (ref 0.0–1.2)
CO2: 17 mmol/L — ABNORMAL LOW (ref 20–29)
Calcium: 8.9 mg/dL (ref 8.6–10.2)
Chloride: 109 mmol/L — ABNORMAL HIGH (ref 96–106)
Creatinine, Ser: 1.59 mg/dL — ABNORMAL HIGH (ref 0.76–1.27)
Globulin, Total: 2.8 g/dL (ref 1.5–4.5)
Glucose: 103 mg/dL — ABNORMAL HIGH (ref 65–99)
Potassium: 4.3 mmol/L (ref 3.5–5.2)
Sodium: 142 mmol/L (ref 134–144)
Total Protein: 6.2 g/dL (ref 6.0–8.5)
eGFR: 47 mL/min/{1.73_m2} — ABNORMAL LOW (ref >59)

## 2021-02-01 LAB — LIPID PANEL
Chol/HDL Ratio: 2.4 ratio (ref 0.0–5.0)
Cholesterol, Total: 113 mg/dL (ref 100–199)
HDL: 47 mg/dL (ref >39)
LDL Chol Calc (NIH): 51 mg/dL (ref 0–99)
Triglycerides: 72 mg/dL (ref 0–149)
VLDL Cholesterol Cal: 15 mg/dL (ref 5–40)

## 2021-02-01 LAB — VITAMIN D 25 HYDROXY (VIT D DEFICIENCY, FRACTURES): Vit D, 25-Hydroxy: 35.8 ng/mL (ref 30.0–100.0)

## 2021-02-01 LAB — TSH: TSH: 2.04 u[IU]/mL (ref 0.450–4.500)

## 2021-02-04 ENCOUNTER — Encounter: Payer: Self-pay | Admitting: Nurse Practitioner

## 2021-02-04 ENCOUNTER — Ambulatory Visit (INDEPENDENT_AMBULATORY_CARE_PROVIDER_SITE_OTHER): Payer: Medicare Other | Admitting: Nurse Practitioner

## 2021-02-04 VITALS — BP 136/77 | HR 80 | Ht 72.0 in | Wt 253.0 lb

## 2021-02-04 DIAGNOSIS — I10 Essential (primary) hypertension: Secondary | ICD-10-CM

## 2021-02-04 DIAGNOSIS — E559 Vitamin D deficiency, unspecified: Secondary | ICD-10-CM

## 2021-02-04 DIAGNOSIS — E1159 Type 2 diabetes mellitus with other circulatory complications: Secondary | ICD-10-CM

## 2021-02-04 DIAGNOSIS — E782 Mixed hyperlipidemia: Secondary | ICD-10-CM | POA: Diagnosis not present

## 2021-02-04 LAB — POCT GLYCOSYLATED HEMOGLOBIN (HGB A1C): HbA1c, POC (controlled diabetic range): 7 % (ref 0.0–7.0)

## 2021-02-04 NOTE — Patient Instructions (Signed)

## 2021-02-04 NOTE — Progress Notes (Signed)
02/04/2021   Endocrinology follow-up note    Subjective:    Patient ID: [name], male    DOB: 01-31-1951, PCP Alycia Rossetti, MD   Past Medical History:  Diagnosis Date  . Arthritis   . Cerebrovascular disease    MRI shows Right carotid inferior cavernours narrowing 75% and  50-75% stenosis of Cavernous and supraclinoi right side  . CKD (chronic kidney disease) 06/28/2014   Sees Dr Florene Glen  . Diabetic retinopathy ([place])   . Hyperlipidemia   . Hypertension   . Myocardial infarction ([place])    "mild" heart attack  . Necrosis ([place])    #2 nail   . Pneumonia    as a child  . Poor circulation of extremity   . Prostate cancer ([place]) 2017   Prostate  . Sleep apnea    uses cpap, getting a new one  . Type 2 Diabetes mellitus    Type 2   Past Surgical History:  Procedure Laterality Date  . ABOVE KNEE LEG AMPUTATION Left 2004   started out below knee and then extended to above knee due to poor healing  . AMPUTATION Right 04/28/2015   Procedure: AMPUTATION RAY, RIGHT 5TH TOE;  Surgeon: Marybelle Killings, MD;  Location: [facility];  Service: Orthopedics;  Laterality: Right;  . AMPUTATION Right 05/10/2015   Procedure: Right Below Knee Amputation;  Surgeon: Marybelle Killings, MD;  Location: [facility];  Service: Orthopedics;  Laterality: Right;  . CATARACT EXTRACTION W/PHACO  09/05/2012   Procedure: CATARACT EXTRACTION PHACO AND INTRAOCULAR LENS PLACEMENT (IOC);  Surgeon: Tonny Branch, MD;  Location: [facility];  Service: Ophthalmology;  Laterality: Left;  CDE=5.45  . CATARACT EXTRACTION W/PHACO  10/03/2012   Procedure: CATARACT EXTRACTION PHACO AND INTRAOCULAR LENS PLACEMENT (IOC);  Surgeon: Tonny Branch, MD;  Location: [facility];  Service: Ophthalmology;  Laterality: Right;  CDE: 12.31  . COLONOSCOPY  2011   Dr. Deatra Ina: colon polyps, tubular adenoma  . COLONOSCOPY N/A 11/01/2018   Dr. Gala Romney: Blood noted in the rectal vault.  Vascular pattern of the rectum abnormal, neovascular changes distally and actively oozing.   There were 3 2 to 5 mm polyps in the splenic flexure, cecum removed.  Cecal polyp was not recovered.  Radiation proctitis status post APC treatment.  The splenic flexure polyps were tubular adenomas.  Next colonoscopy in 5 years.  Marland Kitchen FLEXIBLE SIGMOIDOSCOPY N/A 10/27/2019   Procedure: FLEXIBLE SIGMOIDOSCOPY;  Surgeon: Danie Binder, MD; rectal bleeding due to radiation proctitis s/p APC therapy (actively bleeding during exam), rectosigmoid colon and sigmoid colon appeared normal.  . POLYPECTOMY  11/01/2018   Procedure: POLYPECTOMY;  Surgeon: Daneil Dolin, MD;  [facility];  Service: Endoscopy;;  cecum,splenic flexure   Social History   Socioeconomic History  . Marital status: Married    Spouse name: Not on file  . Number of children: 2  . Years of education: college  . Highest education level: Not on file  Occupational History  . Occupation: Mining engineer: [employer]  Tobacco Use  . Smoking status: Never Smoker  . Smokeless tobacco: Never Used  Vaping Use  . Vaping Use: Never used  Substance and Sexual Activity  . Alcohol use: No  . Drug use: No  . Sexual activity: Never  Other Topics Concern  . Not on file  Social History Narrative   Patient lives with his wife    Patient right handed   Patient drinks  caffine on occ.   Social Determinants of Health   Financial Resource Strain: Not on file  Food Insecurity: Not on file  Transportation Needs: Not on file  Physical Activity: Not on file  Stress: Not on file  Social Connections: Not on file   Outpatient Encounter Medications as of 02/04/2021  Medication Sig  . acetaminophen (TYLENOL) 325 MG tablet Take 600 mg by mouth daily as needed for mild pain, moderate pain or headache.   Marland Kitchen atenolol (TENORMIN) 25 MG tablet TAKE 1 TABLET BY MOUTH EVERY DAY  . atorvastatin (LIPITOR) 80 MG tablet TAKE 1 TABLET BY MOUTH EVERY DAY  . B-D UF III MINI PEN NEEDLES 31G X 5 MM MISC USE FOUR TIMES DAILY AS DIRECTED   . Calcium Carb-Cholecalciferol 600-500 MG-UNIT CAPS Take 2 capsules by mouth daily.   . clopidogrel (PLAVIX) 75 MG tablet TAKE 1 TABLET BY MOUTH EVERY DAY  . CONTOUR TEST test strip TEST TWICE DAILY E11.65  . furosemide (LASIX) 20 MG tablet Take 20 mg by mouth 3 (three) times a week.  Marland Kitchen glucose blood (CONTOUR NEXT TEST) test strip Use as instructed bid. E11.65  . glucose blood test strip 1 each by Other route 2 (two) times daily. Use as instructed bid. E11.65 Contour next  . insulin degludec (TRESIBA FLEXTOUCH) 100 UNIT/ML SOPN FlexTouch Pen Inject 30 Units into the skin at bedtime.  . mirabegron ER (MYRBETRIQ) 25 MG TB24 tablet Take 1 tablet (25 mg total) by mouth daily. (Patient taking differently: Take 25 mg by mouth every other day.)  . Semaglutide 0.25 or 0.5 MG/DOSE SOPN Inject 0.5 mg into the skin every Monday.    No facility-administered encounter medications on file as of 02/04/2021.   ALLERGIES: Allergies  Allergen Reactions  . Zolpidem Tartrate Other (See Comments)    disorientation    VACCINATION STATUS: Immunization History  Administered Date(s) Administered  . Fluad Quad(high Dose 65+) 07/01/2019  . Influenza Split 06/30/2013, 08/30/2013, 08/23/2020  . Influenza-Unspecified 07/30/2015, 06/30/2018  . Moderna Sars-Covid-2 Vaccination 11/20/2019, 12/17/2019  . Pneumococcal Conjugate-13 01/08/2018  . Pneumococcal Polysaccharide-23 07/31/2007, 08/26/2019  . Td 10/30/2002  . Tdap 06/19/2016  . Zoster 06/19/2016    Diabetes He presents for his follow-up diabetic visit. He has type 2 diabetes mellitus. Onset time: He was diagnosed at approximate age of 71 years. His disease course has been stable. There are no hypoglycemic associated symptoms. Pertinent negatives for hypoglycemia include no confusion, pallor or seizures. There are no diabetic associated symptoms. Pertinent negatives for diabetes include no fatigue, no polydipsia, no polyphagia, no polyuria and no weakness.  There are no hypoglycemic complications. Symptoms are stable. Diabetic complications include heart disease, nephropathy and PVD. (Status post bilateral lower extremity amputations with prosthetics.) Risk factors for coronary artery disease include diabetes mellitus, dyslipidemia, hypertension, male sex, obesity, sedentary lifestyle and tobacco exposure. Current diabetic treatment includes insulin injections. He is compliant with treatment most of the time. His weight is fluctuating minimally. He is following a diabetic diet. When asked about meal planning, he reported none. He has had a previous visit with a dietitian. He participates in exercise daily (5 x per week). His home blood glucose trend is fluctuating minimally. His overall blood glucose range is 140-180 mg/dl. (He presents today with his meter, no logs, showing stable glycemic profile overall.  His POCT A1c today is 7%, slightly up from last visit of 6.7%.  He has been using samples of the [place] (or Toujeo when we are out) and  Ozempic due to cost, but his PCP has set him up for a medication assistance program to help with this.  He denies any episodes of hypoglycemia.  He has also started to work out 5 days a week using a Solicitor in his home.) An ACE inhibitor/angiotensin II receptor blocker is not being taken. He does not see a podiatrist.Eye exam is current.  Hyperlipidemia This is a chronic problem. The current episode started more than 1 year ago. The problem is controlled. Recent lipid tests were reviewed and are normal. Exacerbating diseases include chronic renal disease, diabetes and obesity. Factors aggravating his hyperlipidemia include beta blockers. Pertinent negatives include no myalgias. Current antihyperlipidemic treatment includes statins. The current treatment provides moderate improvement of lipids. There are no compliance problems.  Risk factors for coronary artery disease include dyslipidemia, diabetes  mellitus, hypertension, male sex, obesity and a sedentary lifestyle.  Hypertension This is a chronic problem. The current episode started more than 1 year ago. The problem is unchanged. The problem is controlled. There are no associated agents to hypertension. Risk factors for coronary artery disease include diabetes mellitus, dyslipidemia, family history, male gender and sedentary lifestyle. Past treatments include beta blockers and diuretics. The current treatment provides moderate improvement. There are no compliance problems.  Hypertensive end-organ damage includes kidney disease and PVD. Identifiable causes of hypertension include chronic renal disease.    Review of systems  Constitutional: + Minimally fluctuating body weight,  current There is no height or weight on file to calculate BMI. , no fatigue, no subjective hyperthermia, no subjective hypothermia Eyes: no blurry vision, no xerophthalmia ENT: no sore throat, no nodules palpated in throat, no dysphagia/odynophagia, no hoarseness Cardiovascular: no chest pain, no shortness of breath, no palpitations Respiratory: no cough, no shortness of breath Gastrointestinal: no nausea/vomiting/diarrhea Musculoskeletal: no muscle/joint aches, walks with walker d/t BLE amputations with prosthesis Skin: no rashes, no hyperemia Neurological: no tremors, no numbness, no tingling, no dizziness Psychiatric: no depression, no anxiety    Objective:    BP 136/77 (BP Location: Left Arm, Patient Position: Sitting)   Pulse 80   Ht 6' (1.829 m)   Wt 253 lb (114.8 kg)   BMI 34.31 kg/m   Wt Readings from Last 3 Encounters:  02/04/21 253 lb (114.8 kg)  11/29/20 252 lb (114.3 kg)  10/27/20 251 lb 9.6 oz (114.1 kg)    BP Readings from Last 3 Encounters:  02/04/21 136/77  11/29/20 138/90  10/27/20 98/65    Physical Exam- Limited  Constitutional:  There is no height or weight on file to calculate BMI. , not in acute distress, normal state of  mind Eyes:  EOMI, no exophthalmos Neck: Supple Cardiovascular: RRR, no murmers, rubs, or gallops, no edema Respiratory: Adequate breathing efforts, no crackles, rales, rhonchi, or wheezing Musculoskeletal: walks with walker d/t BLE amputations with prosthesis Skin:  no rashes, no hyperemia Neurological: no tremor with outstretched hands    Results for orders placed or performed in visit on 02/04/21  HgB A1c  Result Value Ref Range   Hemoglobin A1C     HbA1c POC (<> result, manual entry)     HbA1c, POC (prediabetic range)     HbA1c, POC (controlled diabetic range) 7.0 0.0 - 7.0 %   Complete Blood Count (Most recent): Lab Results  Component Value Date   WBC 5.1 11/29/2020   HGB 11.5 (L) 11/29/2020   HCT 34.9 (L) 11/29/2020   MCV 90.2 11/29/2020   PLT 203 11/29/2020  Chemistry (most recent): Lab Results  Component Value Date   NA 142 01/31/2021   K 4.3 01/31/2021   CL 109 (H) 01/31/2021   CO2 17 (L) 01/31/2021   BUN 26 01/31/2021   CREATININE 1.59 (H) 01/31/2021   Diabetic Labs (most recent): Lab Results  Component Value Date   HGBA1C 7.0 02/04/2021   HGBA1C 6.7 (A) 08/06/2020   HGBA1C 6.0 (H) 11/26/2019   Lipid Panel     Component Value Date/Time   CHOL 113 01/31/2021 0937   TRIG 72 01/31/2021 0937   TRIG 98 10/27/2013 1305   HDL 47 01/31/2021 0937   HDL 39 (L) 10/27/2013 1305   CHOLHDL 2.4 01/31/2021 0937   CHOLHDL 3.0 02/24/2020 0915   VLDL 14 10/31/2016 1137   LDLCALC 51 01/31/2021 0937   LDLCALC 50 02/24/2020 0915   LDLCALC 82 10/27/2013 1305    Assessment & Plan:   1) Type 2 diabetes mellitus with other circulatory complications (HCC)  -His diabetes is complicated by extensive peripheral arterial disease with bilateral lower extremity amputations and CKD and he remains at a high risk for more acute and chronic complications of diabetes which include CAD, CVA, CKD, retinopathy, and neuropathy. These are all discussed in detail with the  patient.  He presents today with his meter, no logs, showing stable glycemic profile overall.  His POCT A1c today is 7%, slightly up from last visit of 6.7%.  He has been using samples of the [place] (or Toujeo when we are out) and Ozempic due to cost, but his PCP has set him up for a medication assistance program to help with this.  He denies any episodes of hypoglycemia.  He has also started to work out 5 days a week using a Solicitor in his home.   -Recent labs reviewed, showing stage stable 3 renal insufficiency and slightly elevated liver enzymes- he reports he has not been taking excess amount of NSAIDs lately (only takes Tylenol occasionally).  Will recheck liver enzymes prior to next visit.   - Nutritional counseling repeated at each appointment due to patients tendency to fall back in to old habits.  - The patient admits there is a room for improvement in their diet and drink choices. -  Suggestion is made for the patient to avoid simple carbohydrates from their diet including Cakes, Sweet Desserts / Pastries, Ice Cream, Soda (diet and regular), Sweet Tea, Candies, Chips, Cookies, Sweet Pastries,  Store Bought Juices, Alcohol in Excess of  1-2 drinks a day, Artificial Sweeteners, Coffee Creamer, and "Sugar-free" Products. This will help patient to have stable blood glucose profile and potentially avoid unintended weight gain.   - I encouraged the patient to switch to  unprocessed or minimally processed complex starch and increased protein intake (animal or plant source), fruits, and vegetables.   - Patient is advised to stick to a routine mealtimes to eat 3 meals  a day and avoid unnecessary snacks ( to snack only to correct hypoglycemia).  - I have approached patient with the following individualized plan to manage diabetes and patient agrees.  -Given his stable glycemic profile, will continue current medication regimen.   -He is advised to continue Tresiba 30 units  SQ nightly (samples provided from clinic) and continue Ozempic 0.5 mg SQ weekly (samples provided from clinic).  -He is encouraged to continue monitoring blood glucose twice daily, before breakfast and before bed, and call the clinic if he gets readings less than 70 or greater  than 200 for 3 tests in a row.  - His insurance did not provide coverage for Ozempic nor Trulicity.  2) Hypertension: His blood pressure is controlled to target.  He is advised to continue Atenolol 25 mg po daily, and Lasix 20 mg po every other day.  3) Lipids/HPL:  His most recent lipid panel from 01/31/21 shows controlled LDL at 51.  He is advised to continue Lipitor 80 mg po daily at bedtime.  Side effects and precautions discussed with him.   - I advised patient to maintain close follow up with Alycia Rossetti, MD for primary care needs.    I spent 40 minutes in the care of the patient today including review of labs from [facility], Lipids, Thyroid Function, Hematology (current and previous including abstractions from other facilities); face-to-face time discussing  his blood glucose readings/logs, discussing hypoglycemia and hyperglycemia episodes and symptoms, medications doses, his options of short and long term treatment based on the latest standards of care / guidelines;  discussion about incorporating lifestyle medicine;  and documenting the encounter.    Please refer to Patient Instructions for Blood Glucose Monitoring and Insulin/Medications Dosing Guide"  in media tab for additional information. Please  also refer to " Patient Self Inventory" in the Media  tab for reviewed elements of pertinent patient history.  Ozella Almond Thrall participated in the discussions, expressed understanding, and voiced agreement with the above plans.  All questions were answered to his satisfaction. he is encouraged to contact clinic should he have any questions or concerns prior to his return visit.    Follow up plan: -Return in about 6  months (around 08/06/2021) for Diabetes follow up with A1c in office, Previsit labs, Bring glucometer and logs.  Rayetta Pigg, [facility] [address] [place], [place] [address] Phone: [phone] Fax: [phone]  02/04/2021, 9:57 AM

## 2021-02-10 ENCOUNTER — Telehealth: Payer: Self-pay | Admitting: *Deleted

## 2021-02-10 NOTE — Telephone Encounter (Signed)
Prescription assistance medications received.   Received [name] and Ozempic.   Call placed to patient and patient made aware.   States that he will come to pick up on Monday, 02/14/2021.

## 2021-02-25 ENCOUNTER — Telehealth: Payer: Self-pay

## 2021-02-25 NOTE — Telephone Encounter (Signed)
Patient following up on recent conversation about picking up insulin needles;patient unsure of who he spoke with. Patient was supposed to receive a return call yesterday. Please advise at [phone], or [phone].

## 2021-02-25 NOTE — Telephone Encounter (Signed)
No phone call was given to patient in regards to pen needles.    Advised that he can pick them up at pharmacy, but [facility] does not have any to give out with samples.

## 2021-03-02 DIAGNOSIS — Z23 Encounter for immunization: Secondary | ICD-10-CM | POA: Diagnosis not present

## 2021-03-10 ENCOUNTER — Other Ambulatory Visit: Payer: Self-pay

## 2021-03-10 ENCOUNTER — Other Ambulatory Visit (INDEPENDENT_AMBULATORY_CARE_PROVIDER_SITE_OTHER): Payer: Medicare Other

## 2021-03-10 DIAGNOSIS — C61 Malignant neoplasm of prostate: Secondary | ICD-10-CM | POA: Diagnosis not present

## 2021-03-11 ENCOUNTER — Other Ambulatory Visit: Payer: Medicare Other

## 2021-03-11 LAB — TESTOSTERONE: Testosterone: 19 ng/dL — ABNORMAL LOW (ref 264–916)

## 2021-03-14 ENCOUNTER — Ambulatory Visit (INDEPENDENT_AMBULATORY_CARE_PROVIDER_SITE_OTHER): Payer: Medicare Other | Admitting: Pharmacist

## 2021-03-14 DIAGNOSIS — E1122 Type 2 diabetes mellitus with diabetic chronic kidney disease: Secondary | ICD-10-CM

## 2021-03-14 DIAGNOSIS — E782 Mixed hyperlipidemia: Secondary | ICD-10-CM

## 2021-03-14 DIAGNOSIS — Z794 Long term (current) use of insulin: Secondary | ICD-10-CM

## 2021-03-14 DIAGNOSIS — N1832 Chronic kidney disease, stage 3b: Secondary | ICD-10-CM | POA: Diagnosis not present

## 2021-03-14 DIAGNOSIS — I10 Essential (primary) hypertension: Secondary | ICD-10-CM

## 2021-03-14 NOTE — Patient Instructions (Addendum)
Visit Information  Goals Addressed            This Visit's Progress   . Track and Manage My Blood Pressure-Hypertension       Timeframe:  Long-Range Goal Priority:  High Start Date:   03/14/2021                          Expected End Date:    09/14/21                   Follow Up Date 06/29/21    - check blood pressure 3 times per week - choose a place to take my blood pressure (home, clinic or office, retail store) - write blood pressure results in a log or diary    Why is this important?    You won't feel high blood pressure, but it can still hurt your blood vessels.   High blood pressure can cause heart or kidney problems. It can also cause a stroke.   Making lifestyle changes like losing a little weight or eating less salt will help.   Checking your blood pressure at home and at different times of the day can help to control blood pressure.   If the doctor prescribes medicine remember to take it the way the doctor ordered.   Call the office if you cannot afford the medicine or if there are questions about it.     Notes:       Patient Care Plan: General Pharmacy (Adult)    Problem Identified: HTN, HLD, DM   Priority: High  Onset Date: 01/27/2021    Long-Range Goal: Patient-Specific Goal   Start Date: 01/27/2021  Expected End Date: 07/29/2021  Recent Progress: On track  Priority: High  Note:   Current Barriers:  . Unable to independently afford treatment regimen  Pharmacist Clinical Goal(s):  Marland Kitchen Patient will verbalize ability to afford treatment regimen . maintain control of glucose and BP as evidenced by home monitoring and routing screening  . contact provider office for questions/concerns as evidenced notation of same in electronic health record through collaboration with PharmD and provider.   Interventions: . 1:1 collaboration with No primary care provider on file. regarding development and update of comprehensive plan of care as evidenced by provider  attestation and co-signature . Inter-disciplinary care team collaboration (see longitudinal plan of care) . Comprehensive medication review performed; medication list updated in electronic medical record  Hypertension (BP goal <130/80) -Controlled -Current treatment: . Atenolol 25mg  daily -Medications previously tried:   lisinopril -Current home readings: no home monitoring -Denies hypotensive/hypertensive symptoms -Educated on BP goals and benefits of medications for prevention of heart attack, stroke and kidney damage; Daily salt intake goal < 2300 mg; Importance of home blood pressure monitoring; -Counseled to monitor BP at home a few times per week, document, and provide log at future appointments -Recommended to continue current medication  Hyperlipidemia: (LDL goal < 70) -Controlled -Current treatment: . Atorvastatin 80mg  -Medications previously tried: rosuvastatin  -Current exercise habits: senior at home program -Educated on Cholesterol goals;  Benefits of statin for ASCVD risk reduction; Importance of limiting foods high in cholesterol;  -Reviewed most recent lipid panel, commended him for great results -Recommended to continue current medication  Diabetes (A1c goal <7%) -Controlled -Current medications:  Ozempic 0.5mg  SQ every Monday  Tresiba 100u/ml 20 units SQ hs -Medications previously tried: Byetta, Novolog, Toujeo  -Current home glucose readings . fasting glucose: 116-145 . post  prandial glucose: N/A -Denies hypoglycemic/hyperglycemic symptoms  -Current exercise: senior program -Educated on A1c and blood sugar goals; Prevention and management of hypoglycemic episodes; Benefits of routine self-monitoring of blood sugar; Carbohydrate counting and/or plate method -Counseled to check feet daily and get yearly eye exams -Recommended to continue current medication  -patient now getting both medications through PAP program shipped to office   Patient  Goals/Self-Care Activities . Patient will:  - take medications as prescribed check glucose daily, document, and provide at future appointments check blood pressure as able, document, and provide at future appointments  Follow Up Plan: The patient has been provided with contact information for the care management team and has been advised to call with any health related questions or concerns.   He is switching PCPs so I will no longer be able to follow him once he is established.  Will transfer to CCM team with Dr. Wende Neighbors if he is accepted as a patient there.        The patient verbalized understanding of instructions, educational materials, and care plan provided today and agreed to receive a mailed copy of patient instructions, educational materials, and care plan.  The pharmacy team will reach out to the patient again over the next 180 days.   Edythe Clarity, RPH  Hypertension, Adult High blood pressure (hypertension) is when the force of blood pumping through the arteries is too strong. The arteries are the blood vessels that carry blood from the heart throughout the body. Hypertension forces the heart to work harder to pump blood and may cause arteries to become narrow or stiff. Untreated or uncontrolled hypertension can cause a heart attack, heart failure, a stroke, kidney disease, and other problems. A blood pressure reading consists of a higher number over a lower number. Ideally, your blood pressure should be below 120/80. The first ("top") number is called the systolic pressure. It is a measure of the pressure in your arteries as your heart beats. The second ("bottom") number is called the diastolic pressure. It is a measure of the pressure in your arteries as the heart relaxes. What are the causes? The exact cause of this condition is not known. There are some conditions that result in or are related to high blood pressure. What increases the risk? Some risk factors for high  blood pressure are under your control. The following factors may make you more likely to develop this condition:  Smoking.  Having type 2 diabetes mellitus, high cholesterol, or both.  Not getting enough exercise or physical activity.  Being overweight.  Having too much fat, sugar, calories, or salt (sodium) in your diet.  Drinking too much alcohol. Some risk factors for high blood pressure may be difficult or impossible to change. Some of these factors include:  Having chronic kidney disease.  Having a family history of high blood pressure.  Age. Risk increases with age.  Race. You may be at higher risk if you are African American.  Gender. Men are at higher risk than women before age 23. After age 36, women are at higher risk than men.  Having obstructive sleep apnea.  Stress. What are the signs or symptoms? High blood pressure may not cause symptoms. Very high blood pressure (hypertensive crisis) may cause:  Headache.  Anxiety.  Shortness of breath.  Nosebleed.  Nausea and vomiting.  Vision changes.  Severe chest pain.  Seizures. How is this diagnosed? This condition is diagnosed by measuring your blood pressure while you are seated,  with your arm resting on a flat surface, your legs uncrossed, and your feet flat on the floor. The cuff of the blood pressure monitor will be placed directly against the skin of your upper arm at the level of your heart. It should be measured at least twice using the same arm. Certain conditions can cause a difference in blood pressure between your right and left arms. Certain factors can cause blood pressure readings to be lower or higher than normal for a short period of time:  When your blood pressure is higher when you are in a health care provider's office than when you are at home, this is called white coat hypertension. Most people with this condition do not need medicines.  When your blood pressure is higher at home than  when you are in a health care provider's office, this is called masked hypertension. Most people with this condition may need medicines to control blood pressure. If you have a high blood pressure reading during one visit or you have normal blood pressure with other risk factors, you may be asked to:  Return on a different day to have your blood pressure checked again.  Monitor your blood pressure at home for 1 week or longer. If you are diagnosed with hypertension, you may have other blood or imaging tests to help your health care provider understand your overall risk for other conditions. How is this treated? This condition is treated by making healthy lifestyle changes, such as eating healthy foods, exercising more, and reducing your alcohol intake. Your health care provider may prescribe medicine if lifestyle changes are not enough to get your blood pressure under control, and if:  Your systolic blood pressure is above 130.  Your diastolic blood pressure is above 80. Your personal target blood pressure may vary depending on your medical conditions, your age, and other factors. Follow these instructions at home: Eating and drinking  Eat a diet that is high in fiber and potassium, and low in sodium, added sugar, and fat. An example eating plan is called the DASH (Dietary Approaches to Stop Hypertension) diet. To eat this way: ? Eat plenty of fresh fruits and vegetables. Try to fill one half of your plate at each meal with fruits and vegetables. ? Eat whole grains, such as whole-wheat pasta, brown rice, or whole-grain bread. Fill about one fourth of your plate with whole grains. ? Eat or drink low-fat dairy products, such as skim milk or low-fat yogurt. ? Avoid fatty cuts of meat, processed or cured meats, and poultry with skin. Fill about one fourth of your plate with lean proteins, such as fish, chicken without skin, beans, eggs, or tofu. ? Avoid pre-made and processed foods. These tend to  be higher in sodium, added sugar, and fat.  Reduce your daily sodium intake. Most people with hypertension should eat less than 1,500 mg of sodium a day.  Do not drink alcohol if: ? Your health care provider tells you not to drink. ? You are pregnant, may be pregnant, or are planning to become pregnant.  If you drink alcohol: ? Limit how much you use to:  0-1 drink a day for women.  0-2 drinks a day for men. ? Be aware of how much alcohol is in your drink. In the [place] one drink equals one 12 oz bottle of beer (355 mL), one 5 oz glass of wine (148 mL), or one 1 oz glass of hard liquor (44 mL).   Lifestyle  Work  with your health care provider to maintain a healthy body weight or to lose weight. Ask what an ideal weight is for you.  Get at least 30 minutes of exercise most days of the week. Activities may include walking, swimming, or biking.  Include exercise to strengthen your muscles (resistance exercise), such as Pilates or lifting weights, as part of your weekly exercise routine. Try to do these types of exercises for 30 minutes at least 3 days a week.  Do not use any products that contain nicotine or tobacco, such as cigarettes, e-cigarettes, and chewing tobacco. If you need help quitting, ask your health care provider.  Monitor your blood pressure at home as told by your health care provider.  Keep all follow-up visits as told by your health care provider. This is important.   Medicines  Take over-the-counter and prescription medicines only as told by your health care provider. Follow directions carefully. Blood pressure medicines must be taken as prescribed.  Do not skip doses of blood pressure medicine. Doing this puts you at risk for problems and can make the medicine less effective.  Ask your health care provider about side effects or reactions to medicines that you should watch for. Contact a health care provider if you:  Think you are having a reaction to a medicine  you are taking.  Have headaches that keep coming back (recurring).  Feel dizzy.  Have swelling in your ankles.  Have trouble with your vision. Get help right away if you:  Develop a severe headache or confusion.  Have unusual weakness or numbness.  Feel faint.  Have severe pain in your chest or abdomen.  Vomit repeatedly.  Have trouble breathing. Summary  Hypertension is when the force of blood pumping through your arteries is too strong. If this condition is not controlled, it may put you at risk for serious complications.  Your personal target blood pressure may vary depending on your medical conditions, your age, and other factors. For most people, a normal blood pressure is less than 120/80.  Hypertension is treated with lifestyle changes, medicines, or a combination of both. Lifestyle changes include losing weight, eating a healthy, low-sodium diet, exercising more, and limiting alcohol. This information is not intended to replace advice given to you by your health care provider. Make sure you discuss any questions you have with your health care provider. Document Revised: 06/26/2018 Document Reviewed: 06/26/2018 [employer]vier Patient Education  2021 [employer].

## 2021-03-14 NOTE — Progress Notes (Signed)
Chronic Care Management Pharmacy Note  03/14/2021 Name:  [name] MRN:  [id number] DOB:  11/07/1950  Subjective: [name] is an 70 y.o. year old male who is a primary patient of No primary care provider on file..  The CCM team was consulted for assistance with disease management and care coordination needs.    Engaged with patient by telephone for follow up visit in response to provider referral for pharmacy case management and/or care coordination services.   Consent to Services:  The patient was given the following information about Chronic Care Management services today, agreed to services, and gave verbal consent: 1. CCM service includes personalized support from designated clinical staff supervised by the primary care provider, including individualized plan of care and coordination with other care providers 2. 24/7 contact phone numbers for assistance for urgent and routine care needs. 3. Service will only be billed when office clinical staff spend 20 minutes or more in a month to coordinate care. 4. Only one practitioner may furnish and bill the service in a calendar month. 5.The patient may stop CCM services at any time (effective at the end of the month) by phone call to the office staff. 6. The patient will be responsible for cost sharing (co-pay) of up to 20% of the service fee (after annual deductible is met). Patient agreed to services and consent obtained.  Patient Care Team: Edythe Clarity, [facility] as Pharmacist (Pharmacist) Eloise Harman, DO as Consulting Physician (Internal Medicine) Eloise Harman, DO as Consulting Physician (Gastroenterology)  Recent office visits: None since last CCM visit  Recent consult visits: 02/04/21 [name], Endo) - regular follow up no changes to medications, POCT A1c did go up to 7.0  Hospital visits: None in previous 6 months  Objective:  Lab Results  Component Value Date   CREATININE 1.59 (H) 01/31/2021   BUN 26 01/31/2021    GFRNONAA 44 06/29/2020   GFRAA 50 06/29/2020   NA 142 01/31/2021   K 4.3 01/31/2021   CALCIUM 8.9 01/31/2021   CO2 17 (L) 01/31/2021   GLUCOSE 103 (H) 01/31/2021    Lab Results  Component Value Date/Time   HGBA1C 7.0 02/04/2021 09:23 AM   HGBA1C 6.7 (A) 08/06/2020 09:48 AM   HGBA1C 6.0 (H) 11/26/2019 03:20 PM   HGBA1C 6.1 (H) 08/26/2019 12:36 PM   MICROALBUR 30 08/06/2020 09:48 AM   MICROALBUR 0.2 10/31/2016 11:37 AM    Last diabetic Eye exam:  Lab Results  Component Value Date/Time   HMDIABEYEEXA Retinopathy (A) 09/29/2019 01:54 PM    Last diabetic Foot exam: No results found for: HMDIABFOOTEX   Lab Results  Component Value Date   CHOL 113 01/31/2021   HDL 47 01/31/2021   LDLCALC 51 01/31/2021   TRIG 72 01/31/2021   CHOLHDL 2.4 01/31/2021    Hepatic Function Latest Ref Rng & Units 01/31/2021 06/29/2020 02/24/2020  Total Protein 6.0 - 8.5 g/dL 6.2 - 6.0(L)  Albumin 3.8 - 4.8 g/dL 3.4(L) 3.8 -  AST 0 - 40 IU/L 43(H) - 21  ALT 0 - 44 IU/L 64(H) - 27  Alk Phosphatase 44 - 121 IU/L 123(H) - -  Total Bilirubin 0.0 - 1.2 mg/dL 0.4 - 0.4    Lab Results  Component Value Date/Time   TSH 2.040 01/31/2021 09:37 AM   TSH 2.00 10/31/2016 11:37 AM   FREET4 1.19 01/31/2021 09:37 AM   FREET4 1.2 10/31/2016 11:37 AM    CBC Latest Ref Rng & Units 11/29/2020  03/16/2020 02/24/2020  WBC 3.8 - 10.8 Thousand/uL 5.1 5.2 4.9  Hemoglobin 13.2 - 17.1 g/dL 11.5(L) 11.5(L) 11.3(L)  Hematocrit 38.5 - 50.0 % 34.9(L) 35.5(L) 34.7(L)  Platelets 140 - 400 Thousand/uL 203 252 254    Lab Results  Component Value Date/Time   VD25OH 35.8 01/31/2021 09:37 AM   VD25OH 37 10/31/2016 11:37 AM    Clinical ASCVD: No  The ASCVD Risk score [name] DC Jr., et al., 2013) failed to calculate for the following reasons:   The valid total cholesterol range is 130 to 320 mg/dL    Depression screen [facility] 2/9 08/27/2020 02/24/2020 05/13/2018  Decreased Interest 0 0 0  Down, Depressed, Hopeless 0 0 0  PHQ - 2  Score 0 0 0  Altered sleeping - 0 -  Tired, decreased energy - 0 -  Change in appetite - 0 -  Feeling bad or failure about yourself  - 0 -  Trouble concentrating - 0 -  Moving slowly or fidgety/restless - 0 -  Suicidal thoughts - 0 -  PHQ-9 Score - 0 -  Difficult doing work/chores - Not difficult at all -  Some recent data might be hidden      Social History   Tobacco Use  Smoking Status Never Smoker  Smokeless Tobacco Never Used   BP Readings from Last 3 Encounters:  02/04/21 136/77  11/29/20 138/90  10/27/20 98/65   Pulse Readings from Last 3 Encounters:  02/04/21 80  11/29/20 95  10/27/20 94   Wt Readings from Last 3 Encounters:  02/04/21 253 lb (114.8 kg)  11/29/20 252 lb (114.3 kg)  10/27/20 251 lb 9.6 oz (114.1 kg)   BMI Readings from Last 3 Encounters:  02/04/21 34.31 kg/m  11/29/20 34.18 kg/m  10/27/20 34.12 kg/m    Assessment/Interventions: Review of patient past medical history, allergies, medications, health status, including review of consultants reports, laboratory and other test data, was performed as part of comprehensive evaluation and provision of chronic care management services.   SDOH:  (Social Determinants of Health) assessments and interventions performed: Yes   Financial Resource Strain: Not on file    SDOH Screenings   Alcohol Screen: Not on file  Depression (PHQ2-9): Low Risk   . PHQ-2 Score: 0  Financial Resource Strain: Not on file  Food Insecurity: Not on file  Housing: Not on file  Physical Activity: Not on file  Social Connections: Not on file  Stress: Not on file  Tobacco Use: Low Risk   . Smoking Tobacco Use: Never Smoker  . Smokeless Tobacco Use: Never Used  Transportation Needs: Not on file    CCM Care Plan  Allergies  Allergen Reactions  . Zolpidem Tartrate Other (See Comments)    disorientation     Medications Reviewed Today    Reviewed by Edythe Clarity, [facility] (Pharmacist) on 03/14/21 at 1440  Med  List Status: <None>  Medication Order Taking? Sig Documenting Provider Last Dose Status Informant  acetaminophen (TYLENOL) 325 MG tablet [id number] Yes Take 600 mg by mouth daily as needed for mild pain, moderate pain or headache.  [provider] Taking Active Self  atenolol (TENORMIN) 25 MG tablet [id number] Yes TAKE 1 TABLET BY MOUTH EVERY DAY [place], Modena Nunnery, MD Taking Active   atorvastatin (LIPITOR) 80 MG tablet [id number] Yes TAKE 1 TABLET BY MOUTH EVERY DAY [place], Modena Nunnery, MD Taking Active   B-D UF III MINI PEN NEEDLES 31G X 5 MM MISC [id number] Yes USE FOUR TIMES  DAILY AS DIRECTED Cassandria Anger, MD Taking Active Self  Calcium Carb-Cholecalciferol 600-500 MG-UNIT CAPS [id number] Yes Take 2 capsules by mouth daily.  [provider] Taking Active Self  clopidogrel (PLAVIX) 75 MG tablet [id number] Yes TAKE 1 TABLET BY MOUTH EVERY DAY [place]ine, Modena Nunnery, MD Taking Active   CONTOUR TEST test strip [id number] Yes TEST TWICE DAILY E11.65 Cassandria Anger, MD Taking Active Self  furosemide (LASIX) 20 MG tablet [id number] Yes Take 20 mg by mouth 3 (three) times a week. [provider] Taking Active   glucose blood (CONTOUR NEXT TEST) test strip [id number] Yes Use as instructed bid. E11.65 Cassandria Anger, MD Taking Active Self  glucose blood test strip [id number] Yes 1 each by Other route 2 (two) times daily. Use as instructed bid. E11.65 Contour next Cassandria Anger, MD Taking Active Self  insulin degludec (TRESIBA FLEXTOUCH) 100 UNIT/ML SOPN FlexTouch Pen [id number] Yes Inject 30 Units into the skin at bedtime. [provider] Taking Active Self           Med Note Gentry Roch   Wed Oct 15, 2019  9:49 AM)    mirabegron ER (MYRBETRIQ) 25 MG TB24 tablet [id number] Yes Take 1 tablet (25 mg total) by mouth daily.  Patient taking differently: Take 25 mg by mouth every other day.   McKenzie, Candee Furbish, MD Taking Active   Semaglutide 0.25 or 0.5  MG/DOSE Bonney Aid [id number] Yes Inject 0.5 mg into the skin every Monday.  [provider] Taking Active Self          Patient Active Problem List   Diagnosis Date Noted  . Synovitis of right knee 09/10/2020  . Anemia 04/26/2020  . Type 2 diabetes mellitus with stage 3b chronic kidney disease, with long-term current use of insulin ([place]) 03/31/2020  . Nocturia 03/19/2020  . Urge incontinence 03/19/2020  . Diabetic retinopathy ([place]) 02/24/2020  . Radiation proctitis 02/03/2019  . Rectal bleeding 09/02/2018  . Hx of adenomatous colonic polyps 09/02/2018  . Gait instability 07/10/2017  . Prostate cancer ([place]) 06/08/2017  . Disorder of bursa 05/22/2016  . Status post below knee amputation of right lower extremity ([place]) 05/14/2015  . History of left above knee amputation ([place]) 05/14/2015  . OSA (obstructive sleep apnea) 02/23/2015  . CKD (chronic kidney disease) stage 2, GFR 60-89 ml/min 06/28/2014  . Class 1 obesity 08/17/2010  . DM type 2 causing vascular disease ([place]) 08/11/2010  . Mixed hyperlipidemia 08/11/2010  . Essential hypertension 08/11/2010    Immunization History  Administered Date(s) Administered  . Fluad Quad(high Dose 65+) 07/01/2019  . Influenza Split 06/30/2013, 08/30/2013, 08/23/2020  . Influenza-Unspecified 07/30/2015, 06/30/2018  . Moderna Sars-Covid-2 Vaccination 11/20/2019, 12/17/2019  . Pneumococcal Conjugate-13 01/08/2018  . Pneumococcal Polysaccharide-23 07/31/2007, 08/26/2019  . Td 10/30/2002  . Tdap 06/19/2016  . Zoster 06/19/2016    Conditions to be addressed/monitored:  Hypertension, Hyperlipidemia and Diabetes  Care Plan : General Pharmacy (Adult)  Updates made by Edythe Clarity, RPH since 03/14/2021 12:00 AM    Problem: HTN, HLD, DM   Priority: High  Onset Date: 01/27/2021    Long-Range Goal: Patient-Specific Goal   Start Date: 01/27/2021  Expected End Date: 07/29/2021  Recent Progress: On track  Priority: High  Note:   Current  Barriers:  . Unable to independently afford treatment regimen  Pharmacist Clinical Goal(s):  Marland Kitchen Patient will verbalize ability to afford treatment regimen . maintain control of glucose and BP as evidenced by home monitoring  and routing screening  . contact provider office for questions/concerns as evidenced notation of same in electronic health record through collaboration with PharmD and provider.   Interventions: . 1:1 collaboration with No primary care provider on file. regarding development and update of comprehensive plan of care as evidenced by provider attestation and co-signature . Inter-disciplinary care team collaboration (see longitudinal plan of care) . Comprehensive medication review performed; medication list updated in electronic medical record  Hypertension (BP goal <130/80) -Controlled -Current treatment: . Atenolol 56m daily -Medications previously tried:   lisinopril -Current home readings: no home monitoring -Denies hypotensive/hypertensive symptoms -Educated on BP goals and benefits of medications for prevention of heart attack, stroke and kidney damage; Daily salt intake goal < 2300 mg; Importance of home blood pressure monitoring; -Counseled to monitor BP at home a few times per week, document, and provide log at future appointments -Recommended to continue current medication  Hyperlipidemia: (LDL goal < 70) -Controlled -Current treatment: . Atorvastatin 831m-Medications previously tried: rosuvastatin  -Current exercise habits: senior at home program -Educated on Cholesterol goals;  Benefits of statin for ASCVD risk reduction; Importance of limiting foods high in cholesterol;  -Reviewed most recent lipid panel, commended him for great results -Recommended to continue current medication  Diabetes (A1c goal <7%) -Controlled -Current medications:  Ozempic 0.22m33mQ every Monday  Tresiba 100u/ml 20 units SQ hs -Medications previously tried: [place],  Novolog, Toujeo  -Current home glucose readings . fasting glucose: 116-145 . post prandial glucose: N/A -Denies hypoglycemic/hyperglycemic symptoms  -Current exercise: senior program -Educated on A1c and blood sugar goals; Prevention and management of hypoglycemic episodes; Benefits of routine self-monitoring of blood sugar; Carbohydrate counting and/or plate method -Counseled to check feet daily and get yearly eye exams -Recommended to continue current medication  -patient now getting both medications through PAP program shipped to office   Patient Goals/Self-Care Activities . Patient will:  - take medications as prescribed check glucose daily, document, and provide at future appointments check blood pressure as able, document, and provide at future appointments  Follow Up Plan: The patient has been provided with contact information for the care management team and has been advised to call with any health related questions or concerns.   He is switching PCPs so I will no longer be able to follow him once he is established.  Will transfer to CCM team with Dr. ZacWende Neighbors he is accepted as a patient there.        Medication Assistance: TreTyler Aasd Ozempicobtained through novo nordisk medication assistance program.  Enrollment ends 10/29/21  Patient's preferred pharmacy is:  [id number] [place] [id number]: [phone]: [phone] pill box? Yes Pt endorses 100% compliance  We discussed: Benefits of medication synchronization, packaging and delivery as well as enhanced pharmacist oversight with [employer]. Patient decided to: Continue current medication management strategy  Care Plan and Follow Up Patient Decision:  Patient agrees to Care Plan and Follow-up.  Plan: The patient has been provided with contact information for the care management team and has been advised  to call with any health related questions or concerns.   ChriBeverly MilcharmD Clinical Pharmacist [phone]

## 2021-03-17 ENCOUNTER — Ambulatory Visit: Payer: Medicare Other | Admitting: Urology

## 2021-03-18 ENCOUNTER — Encounter: Payer: Self-pay | Admitting: Urology

## 2021-03-18 ENCOUNTER — Ambulatory Visit (INDEPENDENT_AMBULATORY_CARE_PROVIDER_SITE_OTHER): Payer: Medicare Other | Admitting: Urology

## 2021-03-18 ENCOUNTER — Other Ambulatory Visit: Payer: Self-pay

## 2021-03-18 VITALS — BP 101/63 | HR 96

## 2021-03-18 DIAGNOSIS — C61 Malignant neoplasm of prostate: Secondary | ICD-10-CM | POA: Diagnosis not present

## 2021-03-18 DIAGNOSIS — N3941 Urge incontinence: Secondary | ICD-10-CM | POA: Diagnosis not present

## 2021-03-18 DIAGNOSIS — N390 Urinary tract infection, site not specified: Secondary | ICD-10-CM

## 2021-03-18 MED ORDER — LEUPROLIDE ACETATE (6 MONTH) 45 MG ~~LOC~~ KIT
45.0000 mg | PACK | Freq: Once | SUBCUTANEOUS | Status: AC
  Administered 2021-03-18: 45 mg via SUBCUTANEOUS

## 2021-03-18 NOTE — Progress Notes (Signed)
03/18/2021 11:59 AM   [name] 03/25/1951 [id number]  Referring provider: Alycia Rossetti, MD [address] [place],  [place] [address]  Followup urge incontinence and Prostate Cancer  HPI: Mr Diebel is a 70yo here for followup for prostate cancer and urge incontinence. He has mild LUTS and mild urgency with mirabegron 25mg  daily. Overall he is happy with urination. No recent PSA. Testosterone 19. He is due for eligard today.   PMH: Past Medical History:  Diagnosis Date  . Arthritis   . Cerebrovascular disease    MRI shows Right carotid inferior cavernours narrowing 75% and  50-75% stenosis of Cavernous and supraclinoi right side  . CKD (chronic kidney disease) 06/28/2014   Sees Dr Florene Glen  . Diabetic retinopathy ([place])   . Hyperlipidemia   . Hypertension   . Myocardial infarction ([place])    "mild" heart attack  . Necrosis ([place])    #2 nail   . Pneumonia    as a child  . Poor circulation of extremity   . Prostate cancer ([place]) 2017   Prostate  . Sleep apnea    uses cpap, getting a new one  . Type 2 Diabetes mellitus    Type 2    Surgical History: Past Surgical History:  Procedure Laterality Date  . ABOVE KNEE LEG AMPUTATION Left 2004   started out below knee and then extended to above knee due to poor healing  . AMPUTATION Right 04/28/2015   Procedure: AMPUTATION RAY, RIGHT 5TH TOE;  Surgeon: Marybelle Killings, MD;  Location: [facility];  Service: Orthopedics;  Laterality: Right;  . AMPUTATION Right 05/10/2015   Procedure: Right Below Knee Amputation;  Surgeon: Marybelle Killings, MD;  Location: [facility];  Service: Orthopedics;  Laterality: Right;  . CATARACT EXTRACTION W/PHACO  09/05/2012   Procedure: CATARACT EXTRACTION PHACO AND INTRAOCULAR LENS PLACEMENT (IOC);  Surgeon: Tonny Branch, MD;  Location: [facility];  Service: Ophthalmology;  Laterality: Left;  CDE=5.45  . CATARACT EXTRACTION W/PHACO  10/03/2012   Procedure: CATARACT EXTRACTION PHACO AND INTRAOCULAR LENS PLACEMENT (IOC);   Surgeon: Tonny Branch, MD;  Location: [facility];  Service: Ophthalmology;  Laterality: Right;  CDE: 12.31  . COLONOSCOPY  2011   Dr. Deatra Ina: colon polyps, tubular adenoma  . COLONOSCOPY N/A 11/01/2018   Dr. Gala Romney: Blood noted in the rectal vault.  Vascular pattern of the rectum abnormal, neovascular changes distally and actively oozing.  There were 3 2 to 5 mm polyps in the splenic flexure, cecum removed.  Cecal polyp was not recovered.  Radiation proctitis status post APC treatment.  The splenic flexure polyps were tubular adenomas.  Next colonoscopy in 5 years.  Marland Kitchen FLEXIBLE SIGMOIDOSCOPY N/A 10/27/2019   Procedure: FLEXIBLE SIGMOIDOSCOPY;  Surgeon: Danie Binder, MD; rectal bleeding due to radiation proctitis s/p APC therapy (actively bleeding during exam), rectosigmoid colon and sigmoid colon appeared normal.  . POLYPECTOMY  11/01/2018   Procedure: POLYPECTOMY;  Surgeon: Daneil Dolin, MD;  [facility];  Service: Endoscopy;;  cecum,splenic flexure    Home Medications:  Allergies as of 03/18/2021      Reactions   Zolpidem Tartrate Other (See Comments)   disorientation      Medication List       Accurate as of Mar 18, 2021 11:59 AM. If you have any questions, ask your nurse or doctor.        acetaminophen 325 MG tablet Commonly known as: TYLENOL Take 600 mg by  mouth daily as needed for mild pain, moderate pain or headache.   atenolol 25 MG tablet Commonly known as: TENORMIN TAKE 1 TABLET BY MOUTH EVERY DAY   atorvastatin 80 MG tablet Commonly known as: LIPITOR TAKE 1 TABLET BY MOUTH EVERY DAY   B-D UF III MINI PEN NEEDLES 31G X 5 MM Misc Generic drug: Insulin Pen Needle USE FOUR TIMES DAILY AS DIRECTED   Calcium Carb-Cholecalciferol 600-500 MG-UNIT Caps Take 2 capsules by mouth daily.   clopidogrel 75 MG tablet Commonly known as: PLAVIX TAKE 1 TABLET BY MOUTH EVERY DAY   furosemide 20 MG tablet Commonly known as: LASIX Take 20 mg by mouth 3 (three) times a  week.   glucose blood test strip 1 each by Other route 2 (two) times daily. Use as instructed bid. E11.65 Contour next   Contour Test test strip Generic drug: glucose blood TEST TWICE DAILY E11.65   glucose blood test strip Commonly known as: Contour Next Test Use as instructed bid. E11.65   mirabegron ER 25 MG Tb24 tablet Commonly known as: Myrbetriq Take 1 tablet (25 mg total) by mouth daily. What changed: when to take this   Semaglutide(0.25 or 0.5MG /DOS) 2 MG/1.5ML Sopn Inject 0.5 mg into the skin every Monday.   Tyler Aas FlexTouch 100 UNIT/ML FlexTouch Pen Generic drug: insulin degludec Inject 30 Units into the skin at bedtime.       Allergies:  Allergies  Allergen Reactions  . Zolpidem Tartrate Other (See Comments)    disorientation     Family History: Family History  Problem Relation Age of Onset  . Diabetes Mother   . Hypertension Mother   . Cancer Father        lung  . Cancer Sister        breast  . Sickle cell anemia Daughter   . Cancer Maternal Grandfather        prostate  . Colon cancer Neg Hx     Social History:  reports that he has never smoked. He has never used smokeless tobacco. He reports that he does not drink alcohol and does not use drugs.  ROS: All other review of systems were reviewed and are negative except what is noted above in HPI  Physical Exam: BP 101/63   Pulse 96   Constitutional:  Alert and oriented, No acute distress. HEENT: [place] AT, moist mucus membranes.  Trachea midline, no masses. Cardiovascular: No clubbing, cyanosis, or edema. Respiratory: Normal respiratory effort, no increased work of breathing. GI: Abdomen is soft, nontender, nondistended, no abdominal masses GU: No CVA tenderness.  Lymph: No cervical or inguinal lymphadenopathy. Skin: No rashes, bruises or suspicious lesions. Neurologic: Grossly intact, no focal deficits, moving all 4 extremities. Psychiatric: Normal mood and affect.  Laboratory Data: Lab  Results  Component Value Date   WBC 5.1 11/29/2020   HGB 11.5 (L) 11/29/2020   HCT 34.9 (L) 11/29/2020   MCV 90.2 11/29/2020   PLT 203 11/29/2020    Lab Results  Component Value Date   CREATININE 1.59 (H) 01/31/2021    Lab Results  Component Value Date   PSA <0.1 03/16/2020   PSA 6.8 (H) 01/03/2017    Lab Results  Component Value Date   TESTOSTERONE 19 (L) 03/10/2021    Lab Results  Component Value Date   HGBA1C 7.0 02/04/2021    Urinalysis    Component Value Date/Time   COLORURINE STRAW (A) 03/21/2019 1500   APPEARANCEUR Clear 09/17/2020 0921   LABSPEC 1.009 03/21/2019  1500   PHURINE 6.0 03/21/2019 1500   GLUCOSEU Negative 09/17/2020 0921   HGBUR NEGATIVE 03/21/2019 1500   BILIRUBINUR Negative 09/17/2020 [place] 03/21/2019 1500   PROTEINUR Negative 09/17/2020 0921   PROTEINUR NEGATIVE 03/21/2019 1500   UROBILINOGEN 0.2 04/12/2015 0224   NITRITE Negative 09/17/2020 0921   NITRITE NEGATIVE 03/21/2019 1500   LEUKOCYTESUR 2+ (A) 09/17/2020 0921   LEUKOCYTESUR LARGE (A) 03/21/2019 1500    Lab Results  Component Value Date   LABMICR See below: 09/17/2020   WBCUA >30 (A) 09/17/2020   LABEPIT None seen 09/17/2020   MUCUS neg 10/27/2013   BACTERIA Many (A) 09/17/2020    Pertinent Imaging:  No results found for this or any previous visit.  No results found for this or any previous visit.  No results found for this or any previous visit.  No results found for this or any previous visit.  Results for orders placed during the hospital encounter of 10/31/13  US Renal  Narrative CLINICAL DATA:  Elevated serum creatinine. Hypertension and diabetes.  EXAM: RENAL/URINARY TRACT ULTRASOUND COMPLETE  COMPARISON:  08/22/2006  FINDINGS: Right Kidney: 10.1 cm. Slightly increased echogenicity and mild renal cortical thinning. No hydronephrosis.  Left Kidney: 10.5 cm. Slightly increased echogenicity with mild renal cortical thinning. No  hydronephrosis.  Bladder:  Within normal limits.  Bilateral ureteric jets identified.  IMPRESSION: 1.  No hydronephrosis. 2. Findings of medical renal disease, including increased echogenicity and mildly decreased cortical thickness.   Electronically Signed By: Abigail Miyamoto M.D. On: 10/31/2013 15:06  No results found for this or any previous visit.  No results found for this or any previous visit.  No results found for this or any previous visit.   Assessment & Plan:    1. Urge incontinence Continue mirabegron 25mg  daily - Urinalysis, Routine w reflex microscopic  2. Prostate cancer ([place]) -RTC 6 months with PSA - leuprolide (6 Month) (ELIGARD) injection 45 mg   No follow-ups on file.  Nicolette Bang, MD  [facility] [place]

## 2021-03-18 NOTE — Progress Notes (Signed)

## 2021-03-18 NOTE — Patient Instructions (Signed)
Hormone Suppression Therapy for Prostate Cancer  Hormone suppression therapy is a treatment that can help to slow the growth of cancer cells in the prostate gland. It is also called androgen deprivation therapy (ADT) or androgen suppression therapy. Hormone suppression therapy targets hormones in the body that help cancer cells grow-these hormones are called androgens. Hormone suppression therapy alone will not cure prostate cancer, but it can slow the growth of cancer cells and may shrink tumors over time. Your health care provider can help you find the best treatment that fits your lifestyle. Hormone suppression therapy may be used in the following cases:  When prostate cancer has spread too far to other places in the body and cannot be cured by surgery or radiation.  When a person has health problems that prevent them from having surgery or radiation.  Before radiation to help shrink the size of the cancer and make the radiation treatment more effective.  If the prostate cancer remains or comes back following treatment with surgery or radiation. What are the types of hormone suppression therapy? Orchiectomy Orchiectomy, also called surgical castration, is a surgery to remove one or both testicles. The testicles make the two main androgens-testosterone and dihydrotestosterone. This surgery reduces the levels of testosterone in the blood, leading to decreased androgen production. Medicine therapy Medicine therapy, also called medical or chemical castration, involves taking medicines to keep your body from making or using androgens. Medicines can do this in one of three ways: 1. Reducing androgen production by the testicles: ? Luteinizing hormone-releasing hormone (LHRH) agonist medicines. These medicines are injected or implanted under your skin to lower the amount of androgens that your testicles make. If you take these medicines, you may also be prescribed other medicines to help with side  effects. ? LHRH antagonist medicines. These medicines also work to lower the amount of androgens made in the testicles but they work faster than LHRH agonist medicines and have less severe side effects. They are given as a monthly injection under the skin, and they are used when prostate cancer is in an advanced stage. ? Estrogens (male hormones). These medicines help reduce androgen production by the testicles. Usually, other types of hormone suppression therapy are used first based. If they do not work well or if they stop working, then estrogen may be given. 2. Blocking androgen attachment throughout the body. ? Anti-androgen medicines, also called androgen receptor antagonists, block areas on the body where androgens attach. These are pills that are usually used in combination with other types of hormone suppression therapy, like orchiectomy and other medicines. 3. Blocking androgen production throughout the body. ? Androgen synthesis inhibitor medicines. These medicines help to stop other areas of the body from making androgens. They are taken as pills. They may be used if the prostate cancer is advanced and has not gotten better with surgery or other medicines. A steroid medicine may be given with this type of medicine to help with side effects. What are the risks? Hormone suppression therapy may cause side effects, including:  Hot flashes.  Sexual side effects: ? Decrease or lack of sexual desire. ? Decrease in size of penis or testicles. ? Inability to get an erection (erectile dysfunction, or impotence). ? Breast tenderness or increase in breast size.  Fatigue.  Weight gain.  Thinning of the bones (osteoporosis) or loss of muscle mass.  Depression.  Increased cholesterol levels.  Trouble with thinking or focusing. Hormone suppression therapy may also increase your risk of high blood pressure (  hypertension), stroke, heart attack, or diabetes. What are the benefits? One of the  main benefits of hormone suppression therapy is having additional treatment options. You may have only one type of treatment or two or more types at the same time. Treatments may be combined to:  Help with side effects.  Treat advanced cancer. Contact a health care provider if:  You have pain or side effects that do not get better with treatment.  You have trouble urinating.  You have new side effects that do not go away. Get help right away if:  You have severe chest pain.  You have trouble breathing.  You have an irregular heartbeat.  You have numbness or paralysis in the lower half of your body.  You are confused.  You have trouble talking or understanding speech. These symptoms may be an emergency. Do not wait to see if the symptoms will go away. Get medical help right away. Call your local emergency services (911 in the [place] Do not drive yourself to the hospital. Summary  Hormone suppression therapy is a treatment that can help to slow the growth of cancer cells in the prostate (prostate gland).  Hormone suppression therapy alone will not cure prostate cancer, but it can slow the growth of prostate cancer and may shrink tumors over time.  Treatment to suppress hormones may include surgery or medicines. This information is not intended to replace advice given to you by your health care provider. Make sure you discuss any questions you have with your health care provider. Document Revised: 09/11/2019 Document Reviewed: 09/11/2019 Elsevier Patient Education  2021 Else[employer] Inc.  

## 2021-03-18 NOTE — Patient Instructions (Signed)
Prostate Cancer  The prostate is a male gland that helps make semen. It is located below a man's bladder, in front of the rectum. Prostate cancer is when abnormal cells grow in this gland. What are the causes? The cause of this condition is not known. What increases the risk? You are more likely to develop this condition if:  You are 69 years of age or older.  You are African American.  You have a family history of prostate cancer.  You have a family history of breast cancer. What are the signs or symptoms? Symptoms of this condition include:  A need to pee often.  Peeing that is weak, or pee that stops and starts.  Trouble starting or stopping your pee.  Inability to pee.  Blood in your pee or semen.  Pain in the lower back, lower belly (abdomen), hips, or upper thighs.  Trouble getting an erection.  Trouble emptying all of your pee. How is this treated? Treatment for this condition depends on your age, your health, the kind of treatment you like, and how far the cancer has spread. Treatments include:  Being watched. This is called observation. You will be tested from time to time, but you will not get treated. Tests are to make sure that the cancer is not growing.  Surgery. This may be done to remove the prostate, to remove the testicles, or to freeze or kill cancer cells.  Radiation. This uses a strong beam to kill cancer cells.  Ultrasound energy. This uses strong sound waves to kill cancer cells.  Chemotherapy. This uses medicines that stop cancer cells from increasing. This kills cancer cells and healthy cells.  Targeted therapy. This kills cancer cells only. Healthy cells are not affected.  Hormone treatment. This stops the body from making hormones that help the cancer cells to grow. Follow these instructions at home:  Take over-the-counter and prescription medicines only as told by your doctor.  Eat a healthy diet.  Get plenty of sleep.  Ask your  doctor for help to find a support group for men with prostate cancer.  If you have to go to the hospital, let your cancer doctor (oncologist) know.  Treatment may affect your ability to have sex. Touch, hold, hug, and caress your partner to have intimate moments.  Keep all follow-up visits as told by your doctor. This is important. Contact a doctor if:  You have new or more trouble peeing.  You have new or more blood in your pee.  You have new or more pain in your hips, back, or chest. Get help right away if:  You have weakness in your legs.  You lose feeling in your legs.  You cannot control your pee or your poop (stool).  You have chills or a fever. Summary  The prostate is a male gland that helps make semen.  Prostate cancer is when abnormal cells grow in this gland.  Treatment includes doing surgery, using medicines, using very strong beams, or watching without treatment.  Ask your doctor for help to find a support group for men with prostate cancer.  Contact a doctor if you have problems peeing or have any new pain that you did not have before. This information is not intended to replace advice given to you by your health care provider. Make sure you discuss any questions you have with your health care provider. Document Revised: 09/30/2019 Document Reviewed: 09/30/2019 Elsevier Patient Education  2021 [employer] Inc.  

## 2021-03-18 NOTE — Progress Notes (Unsigned)
Eligard SubQ Injection   Due to Prostate Cancer patient is present today for a Eligard Injection.  Medication: Eligard 6 month Dose: 45 mg  Location: left  Lot: [id number] Exp: 08-2022  Patient tolerated well, no complications were noted  Performed by: [place], LPN

## 2021-03-19 LAB — PSA: Prostate Specific Ag, Serum: <0.1 ng/mL (ref 0.0–4.0)

## 2021-03-29 ENCOUNTER — Encounter: Payer: Self-pay | Admitting: Urology

## 2021-03-30 NOTE — Progress Notes (Signed)
Results sent via my chart 

## 2021-05-03 DIAGNOSIS — Z20822 Contact with and (suspected) exposure to covid-19: Secondary | ICD-10-CM | POA: Diagnosis not present

## 2021-05-04 DIAGNOSIS — I10 Essential (primary) hypertension: Secondary | ICD-10-CM | POA: Diagnosis not present

## 2021-05-04 DIAGNOSIS — Z89511 Acquired absence of right leg below knee: Secondary | ICD-10-CM | POA: Diagnosis not present

## 2021-05-04 DIAGNOSIS — C61 Malignant neoplasm of prostate: Secondary | ICD-10-CM | POA: Diagnosis not present

## 2021-05-04 DIAGNOSIS — Z0189 Encounter for other specified special examinations: Secondary | ICD-10-CM | POA: Diagnosis not present

## 2021-05-04 DIAGNOSIS — E782 Mixed hyperlipidemia: Secondary | ICD-10-CM | POA: Diagnosis not present

## 2021-05-04 DIAGNOSIS — N3281 Overactive bladder: Secondary | ICD-10-CM | POA: Diagnosis not present

## 2021-05-04 DIAGNOSIS — E1165 Type 2 diabetes mellitus with hyperglycemia: Secondary | ICD-10-CM | POA: Diagnosis not present

## 2021-05-04 DIAGNOSIS — I2510 Atherosclerotic heart disease of native coronary artery without angina pectoris: Secondary | ICD-10-CM | POA: Diagnosis not present

## 2021-05-04 DIAGNOSIS — E113291 Type 2 diabetes mellitus with mild nonproliferative diabetic retinopathy without macular edema, right eye: Secondary | ICD-10-CM | POA: Diagnosis not present

## 2021-05-04 DIAGNOSIS — Z89619 Acquired absence of unspecified leg above knee: Secondary | ICD-10-CM | POA: Diagnosis not present

## 2021-05-06 ENCOUNTER — Telehealth: Payer: Self-pay | Admitting: *Deleted

## 2021-05-06 NOTE — Telephone Encounter (Signed)
Received delivery of prescription assistance medications Ozempic and Tresiba.   Call placed to patient. Advised that delivery has been received and he can pick up at any time office is open.

## 2021-05-16 DIAGNOSIS — H35033 Hypertensive retinopathy, bilateral: Secondary | ICD-10-CM | POA: Diagnosis not present

## 2021-07-22 ENCOUNTER — Telehealth: Payer: Self-pay | Admitting: Pharmacist

## 2021-07-22 DIAGNOSIS — N1831 Chronic kidney disease, stage 3a: Secondary | ICD-10-CM | POA: Diagnosis not present

## 2021-07-22 NOTE — Progress Notes (Signed)
Chronic Care Management Pharmacy Assistant   Name: [name]  MRN: [id number] DOB: Dec 18, 1950  Reason for Encounter: Disease State For DM.   Conditions to be addressed/monitored: Hypertension, Hyperlipidemia and Diabetes   Recent office visits:  None since 03/14/21  Recent consult visits:  03/18/21 Urology McKenze, Tilman Neat, MD. For prostate Cancer. Per note: GIVEN Leuprolide Acetate 45 mg Subcutaneous.  Hospital visits:  None since 03/14/21  Medications: Outpatient Encounter Medications as of 07/22/2021  Medication Sig   acetaminophen (TYLENOL) 325 MG tablet Take 600 mg by mouth daily as needed for mild pain, moderate pain or headache.    atenolol (TENORMIN) 25 MG tablet TAKE 1 TABLET BY MOUTH EVERY DAY   atorvastatin (LIPITOR) 80 MG tablet TAKE 1 TABLET BY MOUTH EVERY DAY   B-D UF III MINI PEN NEEDLES 31G X 5 MM MISC USE FOUR TIMES DAILY AS DIRECTED   Calcium Carb-Cholecalciferol 600-500 MG-UNIT CAPS Take 2 capsules by mouth daily.    clopidogrel (PLAVIX) 75 MG tablet TAKE 1 TABLET BY MOUTH EVERY DAY   CONTOUR TEST test strip TEST TWICE DAILY E11.65   furosemide (LASIX) 20 MG tablet Take 20 mg by mouth 3 (three) times a week.   glucose blood (CONTOUR NEXT TEST) test strip Use as instructed bid. E11.65   glucose blood test strip 1 each by Other route 2 (two) times daily. Use as instructed bid. E11.65 Contour next   insulin degludec (TRESIBA FLEXTOUCH) 100 UNIT/ML SOPN FlexTouch Pen Inject 30 Units into the skin at bedtime.   mirabegron ER (MYRBETRIQ) 25 MG TB24 tablet Take 1 tablet (25 mg total) by mouth daily. (Patient taking differently: Take 25 mg by mouth every other day.)   Semaglutide 0.25 or 0.5 MG/DOSE SOPN Inject 0.5 mg into the skin every Monday.    No facility-administered encounter medications on file as of 07/22/2021.   Recent Relevant Labs: Lab Results  Component Value Date/Time   HGBA1C 7.0 02/04/2021 09:23 AM   HGBA1C 6.7 (A) 08/06/2020 09:48 AM   HGBA1C  6.0 (H) 11/26/2019 03:20 PM   HGBA1C 6.1 (H) 08/26/2019 12:36 PM   MICROALBUR 30 08/06/2020 09:48 AM   MICROALBUR 0.2 10/31/2016 11:37 AM    Kidney Function Lab Results  Component Value Date/Time   CREATININE 1.59 (H) 01/31/2021 09:37 AM   CREATININE 1.70 (H) 11/29/2020 01:20 PM   CREATININE 1.6 (A) 06/29/2020 12:00 AM   CREATININE 1.61 (H) 02/24/2020 09:15 AM   GFRNONAA 44 06/29/2020 12:00 AM   GFRNONAA 58 (L) 11/26/2019 03:20 PM   GFRAA 50 06/29/2020 12:00 AM   GFRAA 67 11/26/2019 03:20 PM    Current antihyperglycemic regimen:  Ozempic 0.5mg  SQ every Monday Tresiba 100u/ml 20 units SQ hs  What recent interventions/DTPs have been made to improve glycemic control:  None.   Have there been any recent hospitalizations or ED visits since last visit with CPP? Patients chart does not show any hospitalizations or ER visits.  Adherence Review: Is the patient currently on a STATIN medication? Atorvastatin 80 mg  Is the patient currently on ACE/ARB medication? N/A.   Does the patient have >5 day gap between last estimated fill dates? Per misc rpts, no.    Care Gaps:Patient is due for AWV.   Star Rating Drugs: Atorvastatin 80 mg 05/05/21 90 DS.   Third unsuccessful telephone outreach was attempted today. The patient was referred to the pharmacist for assistance with care management and care coordination.   Follow-Up:Pharmacist Review  Charlann Lange,  [place] Clinical Pharmacist Assistant [phone],

## 2021-07-27 DIAGNOSIS — Z89619 Acquired absence of unspecified leg above knee: Secondary | ICD-10-CM | POA: Diagnosis not present

## 2021-07-27 DIAGNOSIS — N1832 Chronic kidney disease, stage 3b: Secondary | ICD-10-CM | POA: Diagnosis not present

## 2021-07-27 DIAGNOSIS — Z89511 Acquired absence of right leg below knee: Secondary | ICD-10-CM | POA: Diagnosis not present

## 2021-07-27 DIAGNOSIS — C61 Malignant neoplasm of prostate: Secondary | ICD-10-CM | POA: Diagnosis not present

## 2021-07-27 DIAGNOSIS — I129 Hypertensive chronic kidney disease with stage 1 through stage 4 chronic kidney disease, or unspecified chronic kidney disease: Secondary | ICD-10-CM | POA: Diagnosis not present

## 2021-07-27 DIAGNOSIS — E1122 Type 2 diabetes mellitus with diabetic chronic kidney disease: Secondary | ICD-10-CM | POA: Diagnosis not present

## 2021-08-01 DIAGNOSIS — H01002 Unspecified blepharitis right lower eyelid: Secondary | ICD-10-CM | POA: Diagnosis not present

## 2021-08-01 DIAGNOSIS — H01001 Unspecified blepharitis right upper eyelid: Secondary | ICD-10-CM | POA: Diagnosis not present

## 2021-08-01 DIAGNOSIS — H26492 Other secondary cataract, left eye: Secondary | ICD-10-CM | POA: Diagnosis not present

## 2021-08-01 DIAGNOSIS — E113553 Type 2 diabetes mellitus with stable proliferative diabetic retinopathy, bilateral: Secondary | ICD-10-CM | POA: Diagnosis not present

## 2021-08-01 HISTORY — PX: REFRACTIVE SURGERY: SHX103

## 2021-08-05 DIAGNOSIS — E1159 Type 2 diabetes mellitus with other circulatory complications: Secondary | ICD-10-CM | POA: Diagnosis not present

## 2021-08-06 LAB — COMPREHENSIVE METABOLIC PANEL
ALT: 39 IU/L (ref 0–44)
AST: 34 IU/L (ref 0–40)
Albumin/Globulin Ratio: 1.4 (ref 1.2–2.2)
Albumin: 3.5 g/dL — ABNORMAL LOW (ref 3.8–4.8)
Alkaline Phosphatase: 162 IU/L — ABNORMAL HIGH (ref 44–121)
BUN/Creatinine Ratio: 15 (ref 10–24)
BUN: 26 mg/dL (ref 8–27)
Bilirubin Total: 0.3 mg/dL (ref 0.0–1.2)
CO2: 20 mmol/L (ref 20–29)
Calcium: 8.7 mg/dL (ref 8.6–10.2)
Chloride: 108 mmol/L — ABNORMAL HIGH (ref 96–106)
Creatinine, Ser: 1.69 mg/dL — ABNORMAL HIGH (ref 0.76–1.27)
Globulin, Total: 2.5 g/dL (ref 1.5–4.5)
Glucose: 123 mg/dL — ABNORMAL HIGH (ref 70–99)
Potassium: 4.3 mmol/L (ref 3.5–5.2)
Sodium: 143 mmol/L (ref 134–144)
Total Protein: 6 g/dL (ref 6.0–8.5)
eGFR: 43 mL/min/{1.73_m2} — ABNORMAL LOW (ref >59)

## 2021-08-08 ENCOUNTER — Ambulatory Visit: Payer: Medicare Other | Admitting: Nurse Practitioner

## 2021-08-08 DIAGNOSIS — I10 Essential (primary) hypertension: Secondary | ICD-10-CM

## 2021-08-08 DIAGNOSIS — E1159 Type 2 diabetes mellitus with other circulatory complications: Secondary | ICD-10-CM

## 2021-08-08 DIAGNOSIS — E782 Mixed hyperlipidemia: Secondary | ICD-10-CM

## 2021-08-08 DIAGNOSIS — E559 Vitamin D deficiency, unspecified: Secondary | ICD-10-CM

## 2021-08-12 NOTE — Patient Instructions (Signed)

## 2021-08-15 ENCOUNTER — Encounter: Payer: Self-pay | Admitting: Nurse Practitioner

## 2021-08-15 ENCOUNTER — Other Ambulatory Visit: Payer: Self-pay

## 2021-08-15 ENCOUNTER — Ambulatory Visit: Payer: Medicare Other | Admitting: Nurse Practitioner

## 2021-08-15 ENCOUNTER — Ambulatory Visit (INDEPENDENT_AMBULATORY_CARE_PROVIDER_SITE_OTHER): Payer: Medicare Other | Admitting: Nurse Practitioner

## 2021-08-15 VITALS — BP 116/70 | HR 77 | Ht 72.0 in | Wt 256.8 lb

## 2021-08-15 DIAGNOSIS — E1159 Type 2 diabetes mellitus with other circulatory complications: Secondary | ICD-10-CM

## 2021-08-15 DIAGNOSIS — I10 Essential (primary) hypertension: Secondary | ICD-10-CM | POA: Diagnosis not present

## 2021-08-15 DIAGNOSIS — E782 Mixed hyperlipidemia: Secondary | ICD-10-CM | POA: Diagnosis not present

## 2021-08-15 DIAGNOSIS — E559 Vitamin D deficiency, unspecified: Secondary | ICD-10-CM | POA: Diagnosis not present

## 2021-08-15 LAB — POCT GLYCOSYLATED HEMOGLOBIN (HGB A1C): HbA1c, POC (controlled diabetic range): 6.8 % (ref 0.0–7.0)

## 2021-08-15 NOTE — Progress Notes (Signed)
08/15/2021   Endocrinology follow-up note    Subjective:    Patient ID: [name], male    DOB: [birth year], PCP Celene Squibb, MD   Past Medical History:  Diagnosis Date   Arthritis    Cerebrovascular disease    MRI shows Right carotid inferior cavernours narrowing 75% and  50-75% stenosis of Cavernous and supraclinoi right side   CKD (chronic kidney disease) 06/28/2014   Sees Dr Florene Glen   Diabetic retinopathy [facility])    Hyperlipidemia    Hypertension    Myocardial infarction [facility])    "mild" heart attack   Necrosis ([place])    #2 nail    Pneumonia    as a child   Poor circulation of extremity    Prostate cancer ([place]) 2017   Prostate   Sleep apnea    uses cpap, getting a new one   Type 2 Diabetes mellitus    Type 2   Past Surgical History:  Procedure Laterality Date   ABOVE KNEE LEG AMPUTATION Left 2004   started out below knee and then extended to above knee due to poor healing   AMPUTATION Right 04/28/2015   Procedure: AMPUTATION RAY, RIGHT 5TH TOE;  Surgeon: Marybelle Killings, MD;  Location: [place];  Service: Orthopedics;  Laterality: Right;   AMPUTATION Right 05/10/2015   Procedure: Right Below Knee Amputation;  Surgeon: Marybelle Killings, MD;  Location: [place];  Service: Orthopedics;  Laterality: Right;   CATARACT EXTRACTION W/PHACO  09/05/2012   Procedure: CATARACT EXTRACTION PHACO AND INTRAOCULAR LENS PLACEMENT (IOC);  Surgeon: Tonny Branch, MD;  [facility]: [facility];  Service: Ophthalmology;  Laterality: Left;  CDE=5.45   CATARACT EXTRACTION W/PHACO  10/03/2012   Procedure: CATARACT EXTRACTION PHACO AND INTRAOCULAR LENS PLACEMENT (IOC);  Surgeon: Tonny Branch, MD;  Location: [facility];  Service: Ophthalmology;  Laterality: Right;  CDE: 12.31   COLONOSCOPY  2011   Dr. Deatra Ina: colon polyps, tubular adenoma   COLONOSCOPY N/A 11/01/2018   Dr. Gala Romney: Blood noted in the rectal vault.  Vascular pattern of the rectum abnormal, neovascular changes distally and actively oozing.  There were 3 2 to  5 mm polyps in the splenic flexure, cecum removed.  Cecal polyp was not recovered.  Radiation proctitis status post APC treatment.  The splenic flexure polyps were tubular adenomas.  Next colonoscopy in 5 years.   FLEXIBLE SIGMOIDOSCOPY N/A 10/27/2019   Procedure: FLEXIBLE SIGMOIDOSCOPY;  Surgeon: Danie Binder, MD; rectal bleeding due to radiation proctitis s/p APC therapy (actively bleeding during exam), rectosigmoid colon and sigmoid colon appeared normal.   POLYPECTOMY  11/01/2018   Procedure: POLYPECTOMY;  Surgeon: Daneil Dolin, MD;  [facility];  Service: Endoscopy;;  cecum,splenic flexure   REFRACTIVE SURGERY Left 08/01/2021   Cleaned cataract   Social History   Socioeconomic History   Marital status: Married    Spouse name: Not on file   Number of children: 2   Years of education: college   Highest education level: Not on file  Occupational History   Occupation: Mining engineer: [employer]  Tobacco Use   Smoking status: Never   Smokeless tobacco: Never  Vaping Use   Vaping Use: Never used  Substance and Sexual Activity   Alcohol use: No   Drug use: No   Sexual activity: Never  Other Topics Concern   Not on file  Social History Narrative   Patient lives with his wife  Patient right handed   Patient drinks caffine on occ.   Social Determinants of Health   Financial Resource Strain: Not on file  Food Insecurity: Not on file  Transportation Needs: Not on file  Physical Activity: Not on file  Stress: Not on file  Social Connections: Not on file   Outpatient Encounter Medications as of 08/15/2021  Medication Sig   acetaminophen (TYLENOL) 325 MG tablet Take 600 mg by mouth daily as needed for mild pain, moderate pain or headache.    atenolol (TENORMIN) 25 MG tablet TAKE 1 TABLET BY MOUTH EVERY DAY   atorvastatin (LIPITOR) 80 MG tablet TAKE 1 TABLET BY MOUTH EVERY DAY   B-D UF III MINI PEN NEEDLES 31G X 5 MM MISC USE FOUR TIMES  DAILY AS DIRECTED   Calcium Carb-Cholecalciferol 600-500 MG-UNIT CAPS Take 2 capsules by mouth daily.    clopidogrel (PLAVIX) 75 MG tablet TAKE 1 TABLET BY MOUTH EVERY DAY   CONTOUR TEST test strip TEST TWICE DAILY E11.65   furosemide (LASIX) 20 MG tablet Take 20 mg by mouth 3 (three) times a week.   glucose blood (CONTOUR NEXT TEST) test strip Use as instructed bid. E11.65   glucose blood test strip 1 each by Other route 2 (two) times daily. Use as instructed bid. E11.65 Contour next   insulin degludec (TRESIBA FLEXTOUCH) 100 UNIT/ML SOPN FlexTouch Pen Inject 30 Units into the skin at bedtime.   mirabegron ER (MYRBETRIQ) 25 MG TB24 tablet Take 1 tablet (25 mg total) by mouth daily. (Patient taking differently: Take 25 mg by mouth every other day.)   Semaglutide 0.25 or 0.5 MG/DOSE SOPN Inject 0.5 mg into the skin every Monday.    No facility-administered encounter medications on file as of 08/15/2021.   ALLERGIES: Allergies  Allergen Reactions   Zolpidem Tartrate Other (See Comments)    disorientation    VACCINATION STATUS: Immunization History  Administered Date(s) Administered   Fluad Quad(high Dose 65+) 07/01/2019   Influenza Split 06/30/2013, 08/30/2013, 08/23/2020   Influenza-Unspecified 07/30/2015, 06/30/2018   Moderna Sars-Covid-2 Vaccination 11/20/2019, 12/17/2019   Pneumococcal Conjugate-13 01/08/2018   Pneumococcal Polysaccharide-23 07/31/2007, 08/26/2019   Td 10/30/2002   Tdap 06/19/2016   Zoster, Live 06/19/2016    Diabetes He presents for his follow-up diabetic visit. He has type 2 diabetes mellitus. Onset time: He was diagnosed at approximate age of 35 years. His disease course has been stable. There are no hypoglycemic associated symptoms. Pertinent negatives for hypoglycemia include no confusion, pallor or seizures. There are no diabetic associated symptoms. Pertinent negatives for diabetes include no fatigue, no polydipsia, no polyphagia, no polyuria and no  weakness. There are no hypoglycemic complications. Symptoms are stable. Diabetic complications include heart disease, nephropathy and PVD. (Status post bilateral lower extremity amputations with prosthetics.) Risk factors for coronary artery disease include diabetes mellitus, dyslipidemia, hypertension, male sex, obesity, sedentary lifestyle and tobacco exposure. Current diabetic treatment includes insulin injections. He is compliant with treatment most of the time. His weight is fluctuating minimally. He is following a diabetic diet. When asked about meal planning, he reported none. He has had a previous visit with a dietitian. He participates in exercise daily (5 x per week). His home blood glucose trend is fluctuating minimally. (He presents today with his meter, no logs, showing stable glycemic profile overall.  His POCT A1c today is 6.8%, improving from last visit of 7%.  He continues to workout 6 days a week and eats healthy.  He denies any hypoglycemia.)  An ACE inhibitor/angiotensin II receptor blocker is not being taken. He does not see a podiatrist.Eye exam is current.  Hyperlipidemia This is a chronic problem. The current episode started more than 1 year ago. The problem is controlled. Recent lipid tests were reviewed and are normal. Exacerbating diseases include chronic renal disease, diabetes and obesity. Factors aggravating his hyperlipidemia include beta blockers. Pertinent negatives include no myalgias. Current antihyperlipidemic treatment includes statins. The current treatment provides moderate improvement of lipids. There are no compliance problems.  Risk factors for coronary artery disease include dyslipidemia, diabetes mellitus, hypertension, male sex, obesity and a sedentary lifestyle.  Hypertension This is a chronic problem. The current episode started more than 1 year ago. The problem is unchanged. The problem is controlled. There are no associated agents to hypertension. Risk factors for  coronary artery disease include diabetes mellitus, dyslipidemia, family history, male gender and sedentary lifestyle. Past treatments include beta blockers and diuretics. The current treatment provides moderate improvement. There are no compliance problems.  Hypertensive end-organ damage includes kidney disease and PVD. Identifiable causes of hypertension include chronic renal disease.   Review of systems  Constitutional: + Minimally fluctuating body weight,  current Body mass index is 34.83 kg/m. , no fatigue, no subjective hyperthermia, no subjective hypothermia Eyes: no blurry vision, no xerophthalmia ENT: no sore throat, no nodules palpated in throat, no dysphagia/odynophagia, no hoarseness Cardiovascular: no chest pain, no shortness of breath, no palpitations Respiratory: no cough, no shortness of breath Gastrointestinal: no nausea/vomiting/diarrhea Musculoskeletal: no muscle/joint aches, walks with walker d/t BLE amputations with prosthesis Skin: no rashes, no hyperemia Neurological: no tremors, no numbness, no tingling, no dizziness Psychiatric: no depression, no anxiety    Objective:    BP 116/70   Pulse 77   Ht 6' (1.829 m)   Wt 256 lb 12.8 oz (116.5 kg)   BMI 34.83 kg/m   Wt Readings from Last 3 Encounters:  08/15/21 256 lb 12.8 oz (116.5 kg)  02/04/21 253 lb (114.8 kg)  11/29/20 252 lb (114.3 kg)    BP Readings from Last 3 Encounters:  08/15/21 116/70  03/18/21 101/63  02/04/21 136/77    Physical Exam- Limited  Constitutional:  Body mass index is 34.83 kg/m., not in acute distress, normal state of mind Eyes:  EOMI, no exophthalmos Neck: Supple Cardiovascular: RRR, no murmurs, rubs, or gallops, no edema Respiratory: Adequate breathing efforts, no crackles, rales, rhonchi, or wheezing Musculoskeletal: walks with walker d/t BLE amputations with prosthesis Skin:  no rashes, no hyperemia Neurological: no tremor with outstretched hands    Results for orders  placed or performed in visit on 08/15/21  POCT glycosylated hemoglobin (Hb A1C)  Result Value Ref Range   Hemoglobin A1C     HbA1c POC (<> result, manual entry)     HbA1c, POC (prediabetic range)     HbA1c, POC (controlled diabetic range) 6.8 0.0 - 7.0 %   Complete Blood Count (Most recent): Lab Results  Component Value Date   WBC 5.1 11/29/2020   HGB 11.5 (L) 11/29/2020   HCT 34.9 (L) 11/29/2020   MCV 90.2 11/29/2020   PLT 203 11/29/2020   Chemistry (most recent): Lab Results  Component Value Date   NA 143 08/05/2021   K 4.3 08/05/2021   CL 108 (H) 08/05/2021   CO2 20 08/05/2021   BUN 26 08/05/2021   CREATININE 1.69 (H) 08/05/2021   Diabetic Labs (most recent): Lab Results  Component Value Date   HGBA1C 6.8 08/15/2021  HGBA1C 7.0 02/04/2021   HGBA1C 6.7 (A) 08/06/2020   Lipid Panel     Component Value Date/Time   CHOL 113 01/31/2021 0937   TRIG 72 01/31/2021 0937   TRIG 98 10/27/2013 1305   HDL 47 01/31/2021 0937   HDL 39 (L) 10/27/2013 1305   CHOLHDL 2.4 01/31/2021 0937   CHOLHDL 3.0 02/24/2020 0915   VLDL 14 10/31/2016 1137   LDLCALC 51 01/31/2021 0937   LDLCALC 50 02/24/2020 0915   LDLCALC 82 10/27/2013 1305    Assessment & Plan:   1) Type 2 diabetes mellitus with other circulatory complications (HCC)  -His diabetes is complicated by extensive peripheral arterial disease with bilateral lower extremity amputations and CKD and he remains at a high risk for more acute and chronic complications of diabetes which include CAD, CVA, CKD, retinopathy, and neuropathy. These are all discussed in detail with the patient.  He presents today with his meter, no logs, showing stable glycemic profile overall.  His POCT A1c today is 6.8%, improving from last visit of 7%.  He continues to workout 6 days a week and eats healthy.  He denies any hypoglycemia.   -Recent labs reviewed.  - Nutritional counseling repeated at each appointment due to patients tendency to fall  back in to old habits.  - The patient admits there is a room for improvement in their diet and drink choices. -  Suggestion is made for the patient to avoid simple carbohydrates from their diet including Cakes, Sweet Desserts / Pastries, Ice Cream, Soda (diet and regular), Sweet Tea, Candies, Chips, Cookies, Sweet Pastries, Store Bought Juices, Alcohol in Excess of 1-2 drinks a day, Artificial Sweeteners, Coffee Creamer, and "Sugar-free" Products. This will help patient to have stable blood glucose profile and potentially avoid unintended weight gain.   - I encouraged the patient to switch to unprocessed or minimally processed complex starch and increased protein intake (animal or plant source), fruits, and vegetables.   - Patient is advised to stick to a routine mealtimes to eat 3 meals a day and avoid unnecessary snacks (to snack only to correct hypoglycemia).  - I have approached patient with the following individualized plan to manage diabetes and patient agrees.  -Given his stable glycemic profile, will continue current medication regimen.   -He is advised to continue Tresiba 30 units SQ nightly (samples provided from clinic) and continue Ozempic 0.5 mg SQ weekly (samples provided from clinic).  -He is encouraged to continue monitoring blood glucose twice daily, before breakfast and before bed, and call the clinic if he gets readings less than 70 or greater than 200 for 3 tests in a row.  - His insurance did not provide coverage for Ozempic nor Trulicity but he does have help with Patient assistance program.  2) Hypertension: His blood pressure is controlled to target.  He is advised to continue Atenolol 25 mg po daily, and Lasix 20 mg po every other day.  3) Lipids/HPL:  His most recent lipid panel from 01/31/21 shows controlled LDL at 51.  He is advised to continue Lipitor 80 mg po daily at bedtime.  Side effects and precautions discussed with him.   - I advised patient to maintain  close follow up with Celene Squibb, MD for primary care needs.      I spent 30 minutes in the care of the patient today including review of labs from [place], Lipids, Thyroid Function, Hematology (current and previous including abstractions from other facilities); face-to-face time discussing  his blood glucose readings/logs, discussing hypoglycemia and hyperglycemia episodes and symptoms, medications doses, his options of short and long term treatment based on the latest standards of care / guidelines;  discussion about incorporating lifestyle medicine;  and documenting the encounter.    Please refer to Patient Instructions for Blood Glucose Monitoring and Insulin/Medications Dosing Guide"  in media tab for additional information. Please  also refer to " Patient Self Inventory" in the Media  tab for reviewed elements of pertinent patient history.  Ozella Almond Burnett participated in the discussions, expressed understanding, and voiced agreement with the above plans.  All questions were answered to his satisfaction. he is encouraged to contact clinic should he have any questions or concerns prior to his return visit.    Follow up plan: -Return in about 6 months (around 02/13/2022) for Diabetes F/U- A1c and UM in office, No previsit labs, Bring meter and logs.  Rayetta Pigg, [facility] [address] [place], [place] [address] Phone: [phone] Fax: [phone]  08/15/2021, 10:13 AM

## 2021-08-22 DIAGNOSIS — H2512 Age-related nuclear cataract, left eye: Secondary | ICD-10-CM | POA: Diagnosis not present

## 2021-08-23 DIAGNOSIS — H2512 Age-related nuclear cataract, left eye: Secondary | ICD-10-CM | POA: Diagnosis not present

## 2021-09-01 ENCOUNTER — Other Ambulatory Visit: Payer: Medicare Other

## 2021-09-01 ENCOUNTER — Other Ambulatory Visit: Payer: Self-pay

## 2021-09-01 DIAGNOSIS — E1165 Type 2 diabetes mellitus with hyperglycemia: Secondary | ICD-10-CM | POA: Diagnosis not present

## 2021-09-01 DIAGNOSIS — I10 Essential (primary) hypertension: Secondary | ICD-10-CM | POA: Diagnosis not present

## 2021-09-01 DIAGNOSIS — C61 Malignant neoplasm of prostate: Secondary | ICD-10-CM

## 2021-09-01 LAB — COMPREHENSIVE METABOLIC PANEL
Albumin: 3.7 (ref 3.5–5.0)
Calcium: 9.5 (ref 8.7–10.7)
Globulin: 2.6

## 2021-09-01 LAB — CBC AND DIFFERENTIAL
HCT: 37 — AB (ref 41–53)
Hemoglobin: 12.2 — AB (ref 13.5–17.5)
Neutrophils Absolute: 3.4
Platelets: 205 (ref 150–399)
WBC: 6

## 2021-09-01 LAB — HEPATIC FUNCTION PANEL
ALT: 44 — AB (ref 10–40)
AST: 33 (ref 14–40)
Alkaline Phosphatase: 149 — AB (ref 25–125)
Bilirubin, Total: 0.3

## 2021-09-01 LAB — LIPID PANEL
Cholesterol: 128 (ref 0–200)
HDL: 46 (ref 35–70)
LDL Cholesterol: 67
Triglycerides: 74 (ref 40–160)

## 2021-09-01 LAB — BASIC METABOLIC PANEL
BUN: 28 — AB (ref 4–21)
CO2: 23 — AB (ref 13–22)
Chloride: 105 (ref 99–108)
Creatinine: 1.7 — AB (ref <=1.3)
Glucose: 136
Potassium: 4.6 (ref 3.4–5.3)
Sodium: 141 (ref 137–147)

## 2021-09-01 LAB — CBC: RBC: 4.09 (ref 3.87–5.11)

## 2021-09-01 LAB — HEMOGLOBIN A1C: Hemoglobin A1C: 7.5

## 2021-09-02 LAB — PSA: Prostate Specific Ag, Serum: <0.1 ng/mL (ref 0.0–4.0)

## 2021-09-05 DIAGNOSIS — Z89619 Acquired absence of unspecified leg above knee: Secondary | ICD-10-CM | POA: Diagnosis not present

## 2021-09-05 DIAGNOSIS — N3281 Overactive bladder: Secondary | ICD-10-CM | POA: Diagnosis not present

## 2021-09-05 DIAGNOSIS — I10 Essential (primary) hypertension: Secondary | ICD-10-CM | POA: Diagnosis not present

## 2021-09-05 DIAGNOSIS — Z89511 Acquired absence of right leg below knee: Secondary | ICD-10-CM | POA: Diagnosis not present

## 2021-09-05 DIAGNOSIS — E1165 Type 2 diabetes mellitus with hyperglycemia: Secondary | ICD-10-CM | POA: Diagnosis not present

## 2021-09-05 DIAGNOSIS — E782 Mixed hyperlipidemia: Secondary | ICD-10-CM | POA: Diagnosis not present

## 2021-09-05 DIAGNOSIS — D649 Anemia, unspecified: Secondary | ICD-10-CM | POA: Diagnosis not present

## 2021-09-05 DIAGNOSIS — N1831 Chronic kidney disease, stage 3a: Secondary | ICD-10-CM | POA: Diagnosis not present

## 2021-09-05 DIAGNOSIS — Z89612 Acquired absence of left leg above knee: Secondary | ICD-10-CM | POA: Diagnosis not present

## 2021-09-05 DIAGNOSIS — I2510 Atherosclerotic heart disease of native coronary artery without angina pectoris: Secondary | ICD-10-CM | POA: Diagnosis not present

## 2021-09-05 DIAGNOSIS — C61 Malignant neoplasm of prostate: Secondary | ICD-10-CM | POA: Diagnosis not present

## 2021-09-05 DIAGNOSIS — E113291 Type 2 diabetes mellitus with mild nonproliferative diabetic retinopathy without macular edema, right eye: Secondary | ICD-10-CM | POA: Diagnosis not present

## 2021-09-08 ENCOUNTER — Encounter: Payer: Self-pay | Admitting: Urology

## 2021-09-08 ENCOUNTER — Other Ambulatory Visit: Payer: Self-pay

## 2021-09-08 ENCOUNTER — Ambulatory Visit (INDEPENDENT_AMBULATORY_CARE_PROVIDER_SITE_OTHER): Payer: Medicare Other | Admitting: Urology

## 2021-09-08 VITALS — BP 120/71 | HR 92 | Temp 97.6°F

## 2021-09-08 DIAGNOSIS — C61 Malignant neoplasm of prostate: Secondary | ICD-10-CM | POA: Diagnosis not present

## 2021-09-08 DIAGNOSIS — N3941 Urge incontinence: Secondary | ICD-10-CM

## 2021-09-08 LAB — URINALYSIS, ROUTINE W REFLEX MICROSCOPIC
Bilirubin, UA: NEGATIVE
Glucose, UA: NEGATIVE
Ketones, UA: NEGATIVE
Leukocytes,UA: NEGATIVE
Nitrite, UA: NEGATIVE
Protein,UA: NEGATIVE
RBC, UA: NEGATIVE
Specific Gravity, UA: 1.02 (ref 1.005–1.030)
Urobilinogen, Ur: 0.2 mg/dL (ref 0.2–1.0)
pH, UA: 6.5 (ref 5.0–7.5)

## 2021-09-08 MED ORDER — MIRABEGRON ER 25 MG PO TB24
25.0000 mg | ORAL_TABLET | ORAL | 0 refills | Status: DC
Start: 2021-09-08 — End: 2022-12-28

## 2021-09-08 NOTE — Progress Notes (Signed)
09/08/2021 12:47 PM   [name] 1951/05/15 [id number]    Followup urge incontinence and Prostate Cancer  HPI: [name] is a 48ML here for followup for prostate cancer and urge incontinence. He has mild LUTS and mild urgency with mirabegron 25mg  every other day. Overall he is happy with urination. PSA is <0.1 and his last  Testosterone was 19 in 5/22. He is 6 days early for eligard today.  UA is clear today.   He has no weight loss or bone pain and he has rare hot flashes.   IPSS     Row Name 09/08/21 1000         International Prostate Symptom Score   How often have you had the sensation of not emptying your bladder? Not at All     How often have you had to urinate less than every two hours? Not at All     How often have you found you stopped and started again several times when you urinated? Not at All     How often have you found it difficult to postpone urination? Less than 1 in 5 times     How often have you had a weak urinary stream? Not at All     How often have you had to strain to start urination? Not at All     How many times did you typically get up at night to urinate? 1 Time     Total IPSS Score 2       Quality of Life due to urinary symptoms   If you were to spend the rest of your life with your urinary condition just the way it is now how would you feel about that? Pleased               PMH: Past Medical History:  Diagnosis Date   Arthritis    Cerebrovascular disease    MRI shows Right carotid inferior cavernours narrowing 75% and  50-75% stenosis of Cavernous and supraclinoi right side   CKD (chronic kidney disease) 06/28/2014   Sees Dr Florene Glen   Diabetic retinopathy [facility])    Hyperlipidemia    Hypertension    Myocardial infarction [facility])    "mild" heart attack   Necrosis ([place])    #2 nail    Pneumonia    as a child   Poor circulation of extremity    Prostate cancer ([place]) 2017   Prostate   Sleep apnea    uses cpap, getting a new one   Type 2  Diabetes mellitus    Type 2    Surgical History: Past Surgical History:  Procedure Laterality Date   ABOVE KNEE LEG AMPUTATION Left 2004   started out below knee and then extended to above knee due to poor healing   AMPUTATION Right 04/28/2015   Procedure: AMPUTATION RAY, RIGHT 5TH TOE;  Surgeon: Marybelle Killings, MD;  Location: [place];  Service: Orthopedics;  Laterality: Right;   AMPUTATION Right 05/10/2015   Procedure: Right Below Knee Amputation;  Surgeon: Marybelle Killings, MD;  Location: [place];  Service: Orthopedics;  Laterality: Right;   CATARACT EXTRACTION W/PHACO  09/05/2012   Procedure: CATARACT EXTRACTION PHACO AND INTRAOCULAR LENS PLACEMENT (IOC);  Surgeon: Tonny Branch, MD;  [facility];  Service: Ophthalmology;  Laterality: Left;  CDE=5.45   CATARACT EXTRACTION W/PHACO  10/03/2012   Procedure: CATARACT EXTRACTION PHACO AND INTRAOCULAR LENS PLACEMENT (IOC);  Surgeon: Tonny Branch, MD;  Location: [facility];  Service: Ophthalmology;  Laterality: Right;  CDE: 12.31   COLONOSCOPY  2011   Dr. Deatra Ina: colon polyps, tubular adenoma   COLONOSCOPY N/A 11/01/2018   Dr. Gala Romney: Blood noted in the rectal vault.  Vascular pattern of the rectum abnormal, neovascular changes distally and actively oozing.  There were 3 2 to 5 mm polyps in the splenic flexure, cecum removed.  Cecal polyp was not recovered.  Radiation proctitis status post APC treatment.  The splenic flexure polyps were tubular adenomas.  Next colonoscopy in 5 years.   FLEXIBLE SIGMOIDOSCOPY N/A 10/27/2019   Procedure: FLEXIBLE SIGMOIDOSCOPY;  Surgeon: Danie Binder, MD; rectal bleeding due to radiation proctitis s/p APC therapy (actively bleeding during exam), rectosigmoid colon and sigmoid colon appeared normal.   POLYPECTOMY  11/01/2018   Procedure: POLYPECTOMY;  Surgeon: Daneil Dolin, MD;  [facility];  Service: Endoscopy;;  cecum,splenic flexure   REFRACTIVE SURGERY Left 08/01/2021   Cleaned cataract    Home  Medications:  Allergies as of 09/08/2021       Reactions   Zolpidem Tartrate Other (See Comments)   disorientation        Medication List        Accurate as of September 08, 2021 12:47 PM. If you have any questions, ask your nurse or doctor.          acetaminophen 325 MG tablet Commonly known as: TYLENOL Take 600 mg by mouth daily as needed for mild pain, moderate pain or headache.   atenolol 25 MG tablet Commonly known as: TENORMIN TAKE 1 TABLET BY MOUTH EVERY DAY   atorvastatin 80 MG tablet Commonly known as: LIPITOR TAKE 1 TABLET BY MOUTH EVERY DAY   B-D UF III MINI PEN NEEDLES 31G X 5 MM Misc Generic drug: Insulin Pen Needle USE FOUR TIMES DAILY AS DIRECTED   Calcium Carb-Cholecalciferol 600-500 MG-UNIT Caps Take 2 capsules by mouth daily.   clopidogrel 75 MG tablet Commonly known as: PLAVIX TAKE 1 TABLET BY MOUTH EVERY DAY   furosemide 20 MG tablet Commonly known as: LASIX Take 20 mg by mouth 3 (three) times a week.   glucose blood test strip 1 each by Other route 2 (two) times daily. Use as instructed bid. E11.65 Contour next   Contour Test test strip Generic drug: glucose blood TEST TWICE DAILY E11.65   glucose blood test strip Commonly known as: Contour Next Test Use as instructed bid. E11.65   mirabegron ER 25 MG Tb24 tablet Commonly known as: Myrbetriq Take 1 tablet (25 mg total) by mouth every other day.   Semaglutide(0.25 or 0.5MG /DOS) 2 MG/1.5ML Sopn Inject 0.5 mg into the skin every Monday.   Tyler Aas FlexTouch 100 UNIT/ML FlexTouch Pen Generic drug: insulin degludec Inject 30 Units into the skin at bedtime.        Allergies:  Allergies  Allergen Reactions   Zolpidem Tartrate Other (See Comments)    disorientation     Family History: Family History  Problem Relation Age of Onset   Diabetes Mother    Hypertension Mother    Cancer Father        lung   Cancer Sister        breast   Sickle cell anemia Daughter     Cancer Maternal Grandfather        prostate   Colon cancer Neg Hx     Social History:  reports that he has never smoked. He has never used smokeless tobacco. He reports that he  does not drink alcohol and does not use drugs.  ROS: All other review of systems were reviewed and are negative except what is noted above in HPI  Physical Exam: BP 120/71   Pulse 92   Temp 97.6 F (36.4 C)    Laboratory Data: Lab Results  Component Value Date   WBC 6.0 09/01/2021   HGB 12.2 (A) 09/01/2021   HCT 37 (A) 09/01/2021   MCV 90.2 11/29/2020   PLT 205 09/01/2021    Lab Results  Component Value Date   CREATININE 1.7 (A) 09/01/2021    Lab Results  Component Value Date   PSA <0.1 03/16/2020   PSA 6.8 (H) 01/03/2017   Lab Results  Component Value Date   PSA1 <0.1 09/01/2021   PSA1 <0.1 03/18/2021   PSA1 <0.1 09/06/2020    Lab Results  Component Value Date   TESTOSTERONE 19 (L) 03/10/2021    Lab Results  Component Value Date   HGBA1C 7.5 09/01/2021    Urinalysis    Component Value Date/Time   COLORURINE STRAW (A) 03/21/2019 1500   APPEARANCEUR Clear 09/08/2021 0957   LABSPEC 1.009 03/21/2019 1500   PHURINE 6.0 03/21/2019 1500   GLUCOSEU Negative 09/08/2021 0957   HGBUR NEGATIVE 03/21/2019 1500   BILIRUBINUR Negative 09/08/2021 0957   KETONESUR NEGATIVE 03/21/2019 1500   PROTEINUR Negative 09/08/2021 0957   PROTEINUR NEGATIVE 03/21/2019 1500   UROBILINOGEN 0.2 04/12/2015 0224   NITRITE Negative 09/08/2021 0957   NITRITE NEGATIVE 03/21/2019 1500   LEUKOCYTESUR Negative 09/08/2021 0957   LEUKOCYTESUR LARGE (A) 03/21/2019 1500    Lab Results  Component Value Date   LABMICR Comment 09/08/2021   WBCUA >30 (A) 09/17/2020   LABEPIT None seen 09/17/2020   MUCUS neg 10/27/2013   BACTERIA Many (A) 09/17/2020    Pertinent Imaging:  No results found for this or any previous visit.  No results found for this or any previous visit.  No results found for this or any  previous visit.  No results found for this or any previous visit.  Results for orders placed during the hospital encounter of 10/31/13    Assessment & Plan:    1. Urge incontinence Continue mirabegron 25mg  qod - Urinalysis, Routine w reflex microscopic  2. Prostate cancer ([place]) -PSA remains undetecitble -RTC 6 months with PSA and T - leuprolide (6 Month) (ELIGARD) injection 45 mg in 6 days and then 6 months later.    Return in about 6 months (around 03/08/2022) for Eligard in 6+ days and then f/u 6 month later with labs. Irine Seal, MD  [facility] [place] Patient ID: [name], male   DOB: 11-23-1950, 70 y.o.   MRN: [id number]

## 2021-09-08 NOTE — Progress Notes (Signed)
Urological Symptom Review  Patient is experiencing the following symptoms: N/a   Review of Systems  Gastrointestinal (upper)  : Negative for upper GI symptoms  Gastrointestinal (lower) : Negative for lower GI symptoms  Constitutional : Negative for symptoms  Skin: Negative for skin symptoms  Eyes: Negative for eye symptoms  Ear/Nose/Throat : Negative for Ear/Nose/Throat symptoms  Hematologic/Lymphatic: Negative for Hematologic/Lymphatic symptoms  Cardiovascular : Negative for cardiovascular symptoms  Respiratory : Cough  Endocrine: Negative for endocrine symptoms  Musculoskeletal: Negative for musculoskeletal symptoms  Neurological: Negative for neurological symptoms  Psychologic: Negative for psychiatric symptoms

## 2021-09-15 ENCOUNTER — Ambulatory Visit: Payer: Medicare Other

## 2021-09-15 ENCOUNTER — Other Ambulatory Visit: Payer: Self-pay

## 2021-09-15 DIAGNOSIS — C61 Malignant neoplasm of prostate: Secondary | ICD-10-CM

## 2021-09-15 MED ORDER — LEUPROLIDE ACETATE (6 MONTH) 45 MG ~~LOC~~ KIT
45.0000 mg | PACK | Freq: Once | SUBCUTANEOUS | Status: AC
Start: 2021-09-15 — End: 2021-09-15
  Administered 2021-09-15: 11:00:00 45 mg via SUBCUTANEOUS

## 2021-09-15 NOTE — Progress Notes (Signed)
Eligard SubQ Injection   Due to Prostate Cancer patient is present today for a Eligard Injection.  Medication: Eligard 6 month Dose: 45 mg  Location: right   Patient tolerated well, no complications were noted  Performed by: Zakariah Dejarnette LPN  Patient will  keep next scheduled OV

## 2021-10-11 ENCOUNTER — Telehealth: Payer: Self-pay | Admitting: Pharmacist

## 2021-10-11 NOTE — Progress Notes (Addendum)
Chronic Care Management Pharmacy Assistant   Name: YEHUDA PRINTUP  MRN: [id number] DOB: 1951-01-15   Reason for Encounter: Disease State - Diabetes Call     Recent office visits:  None noted.   Recent consult visits:  09/08/21 Irine Seal , MD - Urology - Prostate Cancer - Labs were ordered. mirabegron ER (MYRBETRIQ) 25 MG TB24 tablet Take 1 tablet (25 mg total) by mouth every other day prescribed. Follow up in 6 months.   08/15/21 Whitney Reardon. NP - Endocrinology - Diabetes Type 2 - labs were ordered. No medication changes. Follow up in 4 months.   Hospital visits:  None in previous 6 months  Medications: Outpatient Encounter Medications as of 10/11/2021  Medication Sig   acetaminophen (TYLENOL) 325 MG tablet Take 600 mg by mouth daily as needed for mild pain, moderate pain or headache.    atenolol (TENORMIN) 25 MG tablet TAKE 1 TABLET BY MOUTH EVERY DAY   atorvastatin (LIPITOR) 80 MG tablet TAKE 1 TABLET BY MOUTH EVERY DAY   B-D UF III MINI PEN NEEDLES 31G X 5 MM MISC USE FOUR TIMES DAILY AS DIRECTED   Calcium Carb-Cholecalciferol 600-500 MG-UNIT CAPS Take 2 capsules by mouth daily.    clopidogrel (PLAVIX) 75 MG tablet TAKE 1 TABLET BY MOUTH EVERY DAY   CONTOUR TEST test strip TEST TWICE DAILY E11.65   furosemide (LASIX) 20 MG tablet Take 20 mg by mouth 3 (three) times a week.   glucose blood (CONTOUR NEXT TEST) test strip Use as instructed bid. E11.65   glucose blood test strip 1 each by Other route 2 (two) times daily. Use as instructed bid. E11.65 Contour next   insulin degludec (TRESIBA FLEXTOUCH) 100 UNIT/ML SOPN FlexTouch Pen Inject 30 Units into the skin at bedtime.   mirabegron ER (MYRBETRIQ) 25 MG TB24 tablet Take 1 tablet (25 mg total) by mouth every other day.   Semaglutide 0.25 or 0.5 MG/DOSE SOPN Inject 0.5 mg into the skin every Monday.    No facility-administered encounter medications on file as of 10/11/2021.    Current antihyperglycemic regimen:  Ozempic  0.5mg  SQ every Monday Tresiba 100u/ml 20 units SQ hs   What recent interventions/DTPs have been made to improve glycemic control:  Patient reported   Have there been any recent hospitalizations or ED visits since last visit with [facility]?  Patient has not had any ED visits or hospitalizations since last visit with [facility].   Patient denies hypoglycemic symptoms, including Pale, Sweaty, Shaky, Hungry, Nervous/irritable, and Vision changes   Patient denies hyperglycemic symptoms, including blurry vision, excessive thirst, fatigue, and weakness   How often are you checking your blood sugar?    What are your blood sugars ranging?  Fasting:  Before meals:  After meals:  Bedtime:   During the week, how often does your blood glucose drop below 70?    Are you checking your feet daily/regularly?     Adherence Review: Is the patient currently on a STATIN medication? Yes Is the patient currently on ACE/ARB medication? No Does the patient have >5 day gap between last estimated fill dates?  No (pt denied any gaps in fills)  Ozempic 0.5mg  SQ every Monday - last filled unknown  Tresiba 100u/ml 20 units SQ hs - last filled unknown    Care Gaps  AWV: done 02/24/20 Colonoscopy: done 09/02/19 DM Eye Exam: overdue 09/28/20 DM Foot Exam: unknown  Microalbumin: done 08/06/20 HbgAIC: done 09/01/21 (7.5) DEXA: N/A Mammogram: N/A  Star  Rating Drugs: Atorvastatin 80 mg  - last filled 07/27/21 90 days   Future Appointments  Date Time Provider [employer]  02/13/2022 10:00 AM Brita Romp, NP REA-REA None  03/09/2022 11:00 AM AUR-LAB AUR-AUR None  03/16/2022  9:45 AM Irine Seal, MD AUR-AUR None   Patient informed has transferred to Dr Jenny Reichmann hall office and is no longer a patient at [facility]. Patient panel updated. [place], [facility] Clinical Pharmacist Assistant  [phone]

## 2021-10-25 ENCOUNTER — Encounter: Payer: Self-pay | Admitting: Internal Medicine

## 2021-10-27 ENCOUNTER — Telehealth: Payer: Self-pay | Admitting: Nurse Practitioner

## 2021-10-27 NOTE — Telephone Encounter (Signed)
Yes, toujeo is a good alternative

## 2021-10-27 NOTE — Telephone Encounter (Signed)
Pt is bringing some Novo Nor disk paperwork to be filled out and faxed. He is asking for a sample of Tresbia (which we do not have) can he have toujeo to get him by until he gets his pt assistance

## 2021-10-27 NOTE — Telephone Encounter (Signed)
Pt given a sample of Toujeo.

## 2021-11-18 DIAGNOSIS — Z4781 Encounter for orthopedic aftercare following surgical amputation: Secondary | ICD-10-CM | POA: Diagnosis not present

## 2021-11-18 DIAGNOSIS — Z89612 Acquired absence of left leg above knee: Secondary | ICD-10-CM | POA: Diagnosis not present

## 2021-11-18 DIAGNOSIS — Z89511 Acquired absence of right leg below knee: Secondary | ICD-10-CM | POA: Diagnosis not present

## 2021-12-15 ENCOUNTER — Telehealth: Payer: Self-pay | Admitting: Nurse Practitioner

## 2021-12-15 NOTE — Telephone Encounter (Signed)
Mailed patient's patient assistance forms to him

## 2021-12-21 ENCOUNTER — Encounter: Payer: Self-pay | Admitting: Internal Medicine

## 2021-12-21 ENCOUNTER — Other Ambulatory Visit: Payer: Self-pay

## 2021-12-21 ENCOUNTER — Ambulatory Visit (INDEPENDENT_AMBULATORY_CARE_PROVIDER_SITE_OTHER): Payer: Medicare Other | Admitting: Internal Medicine

## 2021-12-21 VITALS — BP 124/74 | HR 76 | Temp 96.9°F | Ht 71.0 in | Wt 256.0 lb

## 2021-12-21 DIAGNOSIS — K625 Hemorrhage of anus and rectum: Secondary | ICD-10-CM

## 2021-12-21 DIAGNOSIS — Z86010 Personal history of colonic polyps: Secondary | ICD-10-CM | POA: Diagnosis not present

## 2021-12-21 NOTE — Telephone Encounter (Signed)
Pt came by for a Toujeo Sample as his patient assistance has not came in yet.

## 2021-12-21 NOTE — Patient Instructions (Signed)
I am happy to hear that you are doing well.  We will continue to monitor your mild rectal bleeding for now.  If this becomes more frequent or if you have a larger volume of bleeding then let us know we will set you up for a flexible sigmoidoscopy to further evaluate.  We will plan on colonoscopy in 2025 given your history of colon polyps.  Otherwise follow-up in 1 year or sooner if needed.  It was very nice meeting you today.  Dr. Abbey Chatters  At [facility] we value your feedback. You may receive a survey about your visit today. Please share your experience as we strive to create trusting relationships with our patients to provide genuine, compassionate, quality care.  We appreciate your understanding and patience as we review any laboratory studies, imaging, and other diagnostic tests that are ordered as we care for you. Our office policy is 5 business days for review of these results, and any emergent or urgent results are addressed in a timely manner for your best interest. If you do not hear from our office in 1 week, please contact us.   We also encourage the use of MyChart, which contains your medical information for your review as well. If you are not enrolled in this feature, an access code is on this after visit summary for your convenience. Thank you for allowing [place] to be involved in your care.  It was great to see you today!  I hope you have a great rest of your Winter!    [name]. Abbey Chatters, D.O. Gastroenterology and Hepatology [facility]

## 2021-12-21 NOTE — Progress Notes (Signed)
Referring Provider: Celene Squibb, MD Primary Care Physician:  Celene Squibb, MD Primary GI:  Dr. Abbey Chatters  Chief Complaint  Patient presents with   Rectal Bleeding    Occ, averages once/week    HPI:   [name] is a 71 y.o. male who presents to clinic today for follow-up visit.  Last seen December 2021.  Patient has a history of radiation proctitis, rectal bleeding, and adenomatous colon polyps. He has flex sig with APC treatment of radiation proctitis on 10/27/19 (actively bleeding during exam). He had APC at time of colonoscopy in 10/2018 as well. Next TCS due in 2025. Also with history of anemia likely secondary to chronic disease. Hemoglobin 12.2 on 09/01/21, consistent with baseline.   Today, continues to have intermittent mild rectal bleeding with bowel movements.  States happens once every 1 to 2 weeks.  Notes small amount of blood on toilet paper and/or with stool.  No rectal pain or discomfort.  No constipation.  No melena.  No chronic NSAIDs.  Does chronically take Plavix.  Otherwise doing well.  Past Medical History:  Diagnosis Date   Arthritis    Cerebrovascular disease    MRI shows Right carotid inferior cavernours narrowing 75% and  50-75% stenosis of Cavernous and supraclinoi right side   CKD (chronic kidney disease) 06/28/2014   Sees Dr Florene Glen   Diabetic retinopathy [facility])    Hyperlipidemia    Hypertension    Myocardial infarction [facility])    "mild" heart attack   Necrosis ([place])    #2 nail    Pneumonia    as a child   Poor circulation of extremity    Prostate cancer ([place]) 2017   Prostate   Sleep apnea    uses cpap, getting a new one   Type 2 Diabetes mellitus    Type 2    Past Surgical History:  Procedure Laterality Date   ABOVE KNEE LEG AMPUTATION Left 2004   started out below knee and then extended to above knee due to poor healing   AMPUTATION Right 04/28/2015   Procedure: AMPUTATION RAY, RIGHT 5TH TOE;  Surgeon: Marybelle Killings, MD;  Location: [place];   Service: Orthopedics;  Laterality: Right;   AMPUTATION Right 05/10/2015   Procedure: Right Below Knee Amputation;  Surgeon: Marybelle Killings, MD;  Location: [place];  Service: Orthopedics;  Laterality: Right;   CATARACT EXTRACTION W/PHACO  09/05/2012   Procedure: CATARACT EXTRACTION PHACO AND INTRAOCULAR LENS PLACEMENT (IOC);  Surgeon: Tonny Branch, MD;  Location: [facility];  Service: Ophthalmology;  Laterality: Left;  CDE=5.45   CATARACT EXTRACTION W/PHACO  10/03/2012   Procedure: CATARACT EXTRACTION PHACO AND INTRAOCULAR LENS PLACEMENT (IOC);  Surgeon: Tonny Branch, MD;  Location: [facility];  Service: Ophthalmology;  Laterality: Right;  CDE: 12.31   COLONOSCOPY  2011   Dr. Deatra Ina: colon polyps, tubular adenoma   COLONOSCOPY N/A 11/01/2018   Dr. Gala Bradshaw: Blood noted in the rectal vault.  Vascular pattern of the rectum abnormal, neovascular changes distally and actively oozing.  There were 3 2 to 5 mm polyps in the splenic flexure, cecum removed.  Cecal polyp was not recovered.  Radiation proctitis status post APC treatment.  The splenic flexure polyps were tubular adenomas.  Next colonoscopy in 5 years.   FLEXIBLE SIGMOIDOSCOPY N/A 10/27/2019   Procedure: FLEXIBLE SIGMOIDOSCOPY;  Surgeon: Danie Binder, MD; rectal bleeding due to radiation proctitis s/p APC therapy (actively bleeding during exam), rectosigmoid colon and sigmoid colon  appeared normal.   POLYPECTOMY  11/01/2018   Procedure: POLYPECTOMY;  Surgeon: Daneil Dolin, MD;  [facility];  Service: Endoscopy;;  cecum,splenic flexure   REFRACTIVE SURGERY Left 08/01/2021   Cleaned cataract    Current Outpatient Medications  Medication Sig Dispense Refill   acetaminophen (TYLENOL) 325 MG tablet Take 650 mg by mouth daily as needed for mild pain, moderate pain or headache.     atenolol (TENORMIN) 25 MG tablet TAKE 1 TABLET BY MOUTH EVERY DAY 90 tablet 1   atorvastatin (LIPITOR) 80 MG tablet TAKE 1 TABLET BY MOUTH EVERY DAY 90 tablet 1   B-D  UF III MINI PEN NEEDLES 31G X 5 MM MISC USE FOUR TIMES DAILY AS DIRECTED 150 each 2   Calcium Carb-Cholecalciferol 600-500 MG-UNIT CAPS Take 2 capsules by mouth daily.      clopidogrel (PLAVIX) 75 MG tablet TAKE 1 TABLET BY MOUTH EVERY DAY 90 tablet 1   CONTOUR TEST test strip TEST TWICE DAILY E11.65 200 each 2   furosemide (LASIX) 20 MG tablet Take 20 mg by mouth 3 (three) times a week.     glucose blood test strip 1 each by Other route 2 (two) times daily. Use as instructed bid. E11.65 Contour next 300 each 2   insulin degludec (TRESIBA FLEXTOUCH) 100 UNIT/ML SOPN FlexTouch Pen Inject 30 Units into the skin at bedtime.     mirabegron ER (MYRBETRIQ) 25 MG TB24 tablet Take 1 tablet (25 mg total) by mouth every other day. 56 tablet 0   Semaglutide 0.25 or 0.5 MG/DOSE SOPN Inject 0.5 mg into the skin every Monday.      glucose blood (CONTOUR NEXT TEST) test strip Use as instructed bid. E11.65 100 each 5   No current facility-administered medications for this visit.    Allergies as of 12/21/2021 - Review Complete 12/21/2021  Allergen Reaction Noted   Zolpidem tartrate Other (See Comments) 04/11/2011    Family History  Problem Relation Age of Onset   Diabetes Mother    Hypertension Mother    Cancer Father        lung   Cancer Sister        breast   Sickle cell anemia Daughter    Cancer Maternal Grandfather        prostate   Colon cancer Neg Hx     Social History   Socioeconomic History   Marital status: Married    Spouse name: Not on file   Number of children: 2   Years of education: college   Highest education level: Not on file  Occupational History   Occupation: Mining engineer: [employer]  Tobacco Use   Smoking status: Never   Smokeless tobacco: Never  Vaping Use   Vaping Use: Never used  Substance and Sexual Activity   Alcohol use: No   Drug use: No   Sexual activity: Never  Other Topics Concern   Not on file  Social History Narrative    Patient lives with his wife    Patient right handed   Patient drinks caffine on occ.   Social Determinants of Health   Financial Resource Strain: Not on file  Food Insecurity: Not on file  Transportation Needs: Not on file  Physical Activity: Not on file  Stress: Not on file  Social Connections: Not on file    Subjective: Review of Systems  Constitutional:  Negative for chills and fever.  HENT:  Negative for  congestion and hearing loss.   Eyes:  Negative for blurred vision and double vision.  Respiratory:  Negative for cough and shortness of breath.   Cardiovascular:  Negative for chest pain and palpitations.  Gastrointestinal:  Positive for blood in stool. Negative for abdominal pain, constipation, diarrhea, heartburn, melena and vomiting.  Genitourinary:  Negative for dysuria and urgency.  Musculoskeletal:  Negative for joint pain and myalgias.  Skin:  Negative for itching and rash.  Neurological:  Negative for dizziness and headaches.  Psychiatric/Behavioral:  Negative for depression. The patient is not nervous/anxious.     Objective: BP 124/74    Pulse 76    Temp (!) 96.9 F (36.1 C) (Temporal)    Ht 5\' 11"  (1.803 m)    Wt 256 lb (116.1 kg)    BMI 35.70 kg/m  Physical Exam Constitutional:      Appearance: Normal appearance.  HENT:     Head: Normocephalic and atraumatic.  Eyes:     Extraocular Movements: Extraocular movements intact.     Conjunctiva/sclera: Conjunctivae normal.  Cardiovascular:     Rate and Rhythm: Normal rate and regular rhythm.  Pulmonary:     Effort: Pulmonary effort is normal.     Breath sounds: Normal breath sounds.  Abdominal:     General: Bowel sounds are normal.     Palpations: Abdomen is soft.  Musculoskeletal:        General: Normal range of motion.     Cervical back: Normal range of motion and neck supple.  Skin:    General: Skin is warm.  Neurological:     General: No focal deficit present.     Mental Status: He is alert and  oriented to person, place, and time.  Psychiatric:        Mood and Affect: Mood normal.        Behavior: Behavior normal.     Assessment: *Rectal bleeding-mild, intermittent *Adenomatous colon polyps  Plan: Discussed rectal bleeding depth today.  Appears to be at baseline.  Mild once every 1 to 2 weeks.  Hemoglobin stable.  Offered flexible sigmoidoscopy to further evaluate.  Patient would like to continue to monitor for now.  If his bleeding becomes more frequent or he has larger volume, he will call office and we will set him up for flexible sigmoidoscopy, ASA 3, will need to hold Plavix x5 days prior.  Colonoscopy recall 2025 due to adenomatous colon polyps in the past.  Otherwise follow-up in 1 year.  12/21/2021 9:49 AM   Disclaimer: This note was dictated with voice recognition software. Similar sounding words can inadvertently be transcribed and may not be corrected upon review.

## 2021-12-28 DIAGNOSIS — H40053 Ocular hypertension, bilateral: Secondary | ICD-10-CM | POA: Diagnosis not present

## 2021-12-29 DIAGNOSIS — C61 Malignant neoplasm of prostate: Secondary | ICD-10-CM | POA: Diagnosis not present

## 2021-12-29 DIAGNOSIS — Z79818 Long term (current) use of other agents affecting estrogen receptors and estrogen levels: Secondary | ICD-10-CM | POA: Diagnosis not present

## 2022-01-27 ENCOUNTER — Telehealth: Payer: Self-pay | Admitting: Nurse Practitioner

## 2022-01-27 NOTE — Telephone Encounter (Signed)
Patient assistance meds and needles in lab-patient aware ?

## 2022-01-31 NOTE — Telephone Encounter (Signed)
Patient's daughter picked up medication and pen needles ?

## 2022-02-13 ENCOUNTER — Ambulatory Visit (INDEPENDENT_AMBULATORY_CARE_PROVIDER_SITE_OTHER): Payer: Medicare Other | Admitting: Nurse Practitioner

## 2022-02-13 ENCOUNTER — Encounter: Payer: Self-pay | Admitting: Nurse Practitioner

## 2022-02-13 VITALS — BP 122/74 | HR 76 | Ht 71.0 in | Wt 254.6 lb

## 2022-02-13 DIAGNOSIS — E559 Vitamin D deficiency, unspecified: Secondary | ICD-10-CM

## 2022-02-13 DIAGNOSIS — I10 Essential (primary) hypertension: Secondary | ICD-10-CM | POA: Diagnosis not present

## 2022-02-13 DIAGNOSIS — E782 Mixed hyperlipidemia: Secondary | ICD-10-CM

## 2022-02-13 DIAGNOSIS — E1159 Type 2 diabetes mellitus with other circulatory complications: Secondary | ICD-10-CM

## 2022-02-13 LAB — POCT GLYCOSYLATED HEMOGLOBIN (HGB A1C): HbA1c, POC (controlled diabetic range): 6.5 % (ref 0.0–7.0)

## 2022-02-13 NOTE — Patient Instructions (Signed)
Diabetes Mellitus and Foot Care Foot care is an important part of your health, especially when you have diabetes. Diabetes may cause you to have problems because of poor blood flow (circulation) to your feet and legs, which can cause your skin to: Become thinner and drier. Break more easily. Heal more slowly. Peel and crack. You may also have nerve damage (neuropathy) in your legs and feet, causing decreased feeling in them. This means that you may not notice minor injuries to your feet that could lead to more serious problems. Noticing and addressing any potential problems early is the best way to prevent future foot problems. How to care for your feet Foot hygiene  Wash your feet daily with warm water and mild soap. Do not use hot water. Then, pat your feet and the areas between your toes until they are completely dry. Do not soak your feet as this can dry your skin. Trim your toenails straight across. Do not dig under them or around the cuticle. File the edges of your nails with an emery board or nail file. Apply a moisturizing lotion or petroleum jelly to the skin on your feet and to dry, brittle toenails. Use lotion that does not contain alcohol and is unscented. Do not apply lotion between your toes. Shoes and socks Wear clean socks or stockings every day. Make sure they are not too tight. Do not wear knee-high stockings since they may decrease blood flow to your legs. Wear shoes that fit properly and have enough cushioning. Always look in your shoes before you put them on to be sure there are no objects inside. To break in new shoes, wear them for just a few hours a day. This prevents injuries on your feet. Wounds, scrapes, corns, and calluses  Check your feet daily for blisters, cuts, bruises, sores, and redness. If you cannot see the bottom of your feet, use a mirror or ask someone for help. Do not cut corns or calluses or try to remove them with medicine. If you find a minor scrape,  cut, or break in the skin on your feet, keep it and the skin around it clean and dry. You may clean these areas with mild soap and water. Do not clean the area with peroxide, alcohol, or iodine. If you have a wound, scrape, corn, or callus on your foot, look at it several times a day to make sure it is healing and not infected. Check for: Redness, swelling, or pain. Fluid or blood. Warmth. Pus or a bad smell. General tips Do not cross your legs. This may decrease blood flow to your feet. Do not use heating pads or hot water bottles on your feet. They may burn your skin. If you have lost feeling in your feet or legs, you may not know this is happening until it is too late. Protect your feet from hot and cold by wearing shoes, such as at the beach or on hot pavement. Schedule a complete foot exam at least once a year (annually) or more often if you have foot problems. Report any cuts, sores, or bruises to your health care provider immediately. Where to find more information American Diabetes Association: www.diabetes.org Association of Diabetes Care & Education Specialists: www.diabeteseducator.org Contact a health care provider if: You have a medical condition that increases your risk of infection and you have any cuts, sores, or bruises on your feet. You have an injury that is not healing. You have redness on your legs or feet. You   feel burning or tingling in your legs or feet. You have pain or cramps in your legs and feet. Your legs or feet are numb. Your feet always feel cold. You have pain around any toenails. Get help right away if: You have a wound, scrape, corn, or callus on your foot and: You have pain, swelling, or redness that gets worse. You have fluid or blood coming from the wound, scrape, corn, or callus. Your wound, scrape, corn, or callus feels warm to the touch. You have pus or a bad smell coming from the wound, scrape, corn, or callus. You have a fever. You have a red  line going up your leg. Summary Check your feet every day for blisters, cuts, bruises, sores, and redness. Apply a moisturizing lotion or petroleum jelly to the skin on your feet and to dry, brittle toenails. Wear shoes that fit properly and have enough cushioning. If you have foot problems, report any cuts, sores, or bruises to your health care provider immediately. Schedule a complete foot exam at least once a year (annually) or more often if you have foot problems. This information is not intended to replace advice given to you by your health care provider. Make sure you discuss any questions you have with your health care provider. Document Revised: 05/06/2020 Document Reviewed: 05/06/2020 Elsevier Patient Education  2023 [employer]  

## 2022-02-13 NOTE — Progress Notes (Signed)
?02/13/2022 ? ? ?Endocrinology follow-up note ? ? ? ?Subjective:  ? ? Patient ID: [name], male    DOB: 10-Oct-1951, PCP Celene Squibb, MD ? ? ?Past Medical History:  ?Diagnosis Date  ? Arthritis   ? Cerebrovascular disease   ? MRI shows Right carotid inferior cavernours narrowing 75% and  50-75% stenosis of Cavernous and supraclinoi right side  ? CKD (chronic kidney disease) 06/28/2014  ? Sees Dr Florene Glen  ? Diabetic retinopathy ([place])   ? Hyperlipidemia   ? Hypertension   ? Myocardial infarction [facility])   ? "mild" heart attack  ? Necrosis ([place])   ? #2 nail   ? Pneumonia   ? as a child  ? Poor circulation of extremity   ? Prostate cancer ([place]) 2017  ? Prostate  ? Sleep apnea   ? uses cpap, getting a new one  ? Type 2 Diabetes mellitus   ? Type 2  ? ?Past Surgical History:  ?Procedure Laterality Date  ? ABOVE KNEE LEG AMPUTATION Left 2004  ? started out below knee and then extended to above knee due to poor healing  ? AMPUTATION Right 04/28/2015  ? Procedure: AMPUTATION RAY, RIGHT 5TH TOE;  Surgeon: Marybelle Killings, MD;  Location: [place];  Service: Orthopedics;  Laterality: Right;  ? AMPUTATION Right 05/10/2015  ? Procedure: Right Below Knee Amputation;  Surgeon: Marybelle Killings, MD;  Location: [place];  Service: Orthopedics;  Laterality: Right;  ? CATARACT EXTRACTION W/PHACO  09/05/2012  ? Procedure: CATARACT EXTRACTION PHACO AND INTRAOCULAR LENS PLACEMENT (IOC);  Surgeon: Tonny Branch, MD;  [facility];  Service: Ophthalmology;  Laterality: Left;  CDE=5.45  ? CATARACT EXTRACTION W/PHACO  10/03/2012  ? Procedure: CATARACT EXTRACTION PHACO AND INTRAOCULAR LENS PLACEMENT (IOC);  Surgeon: Tonny Branch, MD;  [facility]: [facility];  Service: Ophthalmology;  Laterality: Right;  CDE: 12.31  ? COLONOSCOPY  2011  ? Dr. Deatra Ina: colon polyps, tubular adenoma  ? COLONOSCOPY N/A 11/01/2018  ? Dr. Gala Romney: Blood noted in the rectal vault.  Vascular pattern of the rectum abnormal, neovascular changes distally and actively oozing.  There were 3 2 to 5  mm polyps in the splenic flexure, cecum removed.  Cecal polyp was not recovered.  Radiation proctitis status post APC treatment.  The splenic flexure polyps were tubular adenomas.  Next colonoscopy in 5 years.  ? FLEXIBLE SIGMOIDOSCOPY N/A 10/27/2019  ? Procedure: FLEXIBLE SIGMOIDOSCOPY;  Surgeon: Danie Binder, MD; rectal bleeding due to radiation proctitis s/p APC therapy (actively bleeding during exam), rectosigmoid colon and sigmoid colon appeared normal.  ? POLYPECTOMY  11/01/2018  ? Procedure: POLYPECTOMY;  Surgeon: Daneil Dolin, MD;  [facility];  Service: Endoscopy;;  cecum,splenic flexure  ? REFRACTIVE SURGERY Left 08/01/2021  ? Cleaned cataract  ? ?Social History  ? ?Socioeconomic History  ? Marital status: Married  ?  Spouse name: Not on file  ? Number of children: 2  ? Years of education: college  ? Highest education level: Not on file  ?Occupational History  ? Occupation: Theme park manager  ?  Employer: [employer]  ?Tobacco Use  ? Smoking status: Never  ? Smokeless tobacco: Never  ?Vaping Use  ? Vaping Use: Never used  ?Substance and Sexual Activity  ? Alcohol use: No  ? Drug use: No  ? Sexual activity: Never  ?Other Topics Concern  ? Not on file  ?Social History Narrative  ? Patient lives with his wife   ?  Patient right handed  ? Patient drinks caffine on occ.  ? ?Social Determinants of Health  ? ?Financial Resource Strain: Not on file  ?Food Insecurity: Not on file  ?Transportation Needs: Not on file  ?Physical Activity: Not on file  ?Stress: Not on file  ?Social Connections: Not on file  ? ?Outpatient Encounter Medications as of 02/13/2022  ?Medication Sig  ? acetaminophen (TYLENOL) 325 MG tablet Take 650 mg by mouth daily as needed for mild pain, moderate pain or headache.  ? atenolol (TENORMIN) 25 MG tablet TAKE 1 TABLET BY MOUTH EVERY DAY  ? atorvastatin (LIPITOR) 80 MG tablet TAKE 1 TABLET BY MOUTH EVERY DAY  ? B-D UF III MINI PEN NEEDLES 31G X 5 MM MISC USE FOUR TIMES DAILY  AS DIRECTED  ? Calcium Carb-Cholecalciferol 600-500 MG-UNIT CAPS Take 2 capsules by mouth daily.   ? clopidogrel (PLAVIX) 75 MG tablet TAKE 1 TABLET BY MOUTH EVERY DAY  ? CONTOUR TEST test strip TEST TWICE DAILY E11.65  ? furosemide (LASIX) 20 MG tablet Take 20 mg by mouth 3 (three) times a week.  ? glucose blood test strip 1 each by Other route 2 (two) times daily. Use as instructed bid. E11.65 Contour next  ? insulin degludec (TRESIBA FLEXTOUCH) 100 UNIT/ML SOPN FlexTouch Pen Inject 30 Units into the skin at bedtime.  ? latanoprost (XALATAN) 0.005 % ophthalmic solution 1 drop at bedtime.  ? mirabegron ER (MYRBETRIQ) 25 MG TB24 tablet Take 1 tablet (25 mg total) by mouth every other day.  ? Semaglutide 0.25 or 0.5 MG/DOSE SOPN Inject 0.5 mg into the skin every Monday.   ? ?No facility-administered encounter medications on file as of 02/13/2022.  ? ?ALLERGIES: ?Allergies  ?Allergen Reactions  ? Zolpidem Tartrate Other (See Comments)  ?  disorientation ?  ? ?VACCINATION STATUS: ?Immunization History  ?Administered Date(s) Administered  ? Fluad Quad(high Dose 65+) 07/01/2019  ? Influenza Split 06/30/2013, 08/30/2013, 08/23/2020  ? Influenza-Unspecified 07/30/2015, 06/30/2018  ? Moderna Sars-Covid-2 Vaccination 11/20/2019, 12/17/2019  ? Pneumococcal Conjugate-13 01/08/2018  ? Pneumococcal Polysaccharide-23 07/31/2007, 08/26/2019  ? Td 10/30/2002  ? Tdap 06/19/2016  ? Zoster, Live 06/19/2016  ? ? ?Diabetes ?He presents for his follow-up diabetic visit. He has type 2 diabetes mellitus. Onset time: He was diagnosed at approximate age of 61 years. His disease course has been improving. There are no hypoglycemic associated symptoms. Pertinent negatives for hypoglycemia include no confusion, pallor or seizures. There are no diabetic associated symptoms. Pertinent negatives for diabetes include no fatigue, no polydipsia, no polyphagia, no polyuria and no weakness. There are no hypoglycemic complications. Symptoms are stable.  Diabetic complications include heart disease, nephropathy and PVD. (Status post bilateral lower extremity amputations with prosthetics.) Risk factors for coronary artery disease include diabetes mellitus, dyslipidemia, hypertension, male sex, obesity, sedentary lifestyle and tobacco exposure. Current diabetic treatment includes insulin injections (and Ozempic). He is compliant with treatment most of the time. His weight is fluctuating minimally. He is following a diabetic diet. When asked about meal planning, he reported none. He has had a previous visit with a dietitian. He participates in exercise daily (5 x per week). His home blood glucose trend is fluctuating minimally. His overall blood glucose range is 130-140 mg/dl. (He presents today with his meter, no logs, showing stable, at target fasting glycemic profile.  His POCT A1c today is 6.5%, improving from last visit of 7.5%.  He denies any hypoglycemia.  Analysis of his meter shows 7-day average of 133, 14-day average  of 145, 28-day average of 146.  He continues to work out every day and eat right.) An ACE inhibitor/angiotensin II receptor blocker is not being taken. He does not see a podiatrist.Eye exam is current.  ?Hyperlipidemia ?This is a chronic problem. The current episode started more than 1 year ago. The problem is controlled. Recent lipid tests were reviewed and are normal. Exacerbating diseases include chronic renal disease, diabetes and obesity. Factors aggravating his hyperlipidemia include beta blockers. Pertinent negatives include no myalgias. Current antihyperlipidemic treatment includes statins. The current treatment provides moderate improvement of lipids. There are no compliance problems.  Risk factors for coronary artery disease include dyslipidemia, diabetes mellitus, hypertension, male sex, obesity and a sedentary lifestyle.  ?Hypertension ?This is a chronic problem. The current episode started more than 1 year ago. The problem is  unchanged. The problem is controlled. There are no associated agents to hypertension. Risk factors for coronary artery disease include diabetes mellitus, dyslipidemia, family history, male gender and sedentar

## 2022-02-15 DIAGNOSIS — H40023 Open angle with borderline findings, high risk, bilateral: Secondary | ICD-10-CM | POA: Diagnosis not present

## 2022-02-16 ENCOUNTER — Ambulatory Visit (INDEPENDENT_AMBULATORY_CARE_PROVIDER_SITE_OTHER): Payer: Medicare Other | Admitting: Internal Medicine

## 2022-02-16 ENCOUNTER — Encounter: Payer: Self-pay | Admitting: *Deleted

## 2022-02-16 VITALS — BP 130/68 | HR 83 | Temp 97.7°F | Ht 71.0 in | Wt 253.0 lb

## 2022-02-16 DIAGNOSIS — K625 Hemorrhage of anus and rectum: Secondary | ICD-10-CM | POA: Diagnosis not present

## 2022-02-16 DIAGNOSIS — K627 Radiation proctitis: Secondary | ICD-10-CM

## 2022-02-16 NOTE — Patient Instructions (Addendum)
We will schedule you for flexible sigmoidoscopy to further evaluate your rectal bleeding.  You will need to hold Plavix x5 days prior to this procedure. ? ?It was very nice seeing you again today. ? ?Dr. Abbey Chatters ? ?At [facility] we value your feedback. You may receive a survey about your visit today. Please share your experience as we strive to create trusting relationships with our patients to provide genuine, compassionate, quality care. ? ?We appreciate your understanding and patience as we review any laboratory studies, imaging, and other diagnostic tests that are ordered as we care for you. Our office policy is 5 business days for review of these results, and any emergent or urgent results are addressed in a timely manner for your best interest. If you do not hear from our office in 1 week, please contact us.  ? ?We also encourage the use of MyChart, which contains your medical information for your review as well. If you are not enrolled in this feature, an access code is on this after visit summary for your convenience. Thank you for allowing [place] to be involved in your care. ? ?It was great to see you today!  I hope you have a great rest of your Spring! ? ? ? ?Elon Alas. Abbey Chatters, D.O. ?Gastroenterology and Hepatology ?[facility] ?Patient complains of increased rectal bleeding.  Previously was only happening 1-2 times per worsening worsening ?

## 2022-02-16 NOTE — Progress Notes (Signed)
? ? ?Referring Provider: Celene Squibb, MD ?Primary Care Physician:  Celene Squibb, MD ?Primary GI:  Dr. Abbey Chatters ? ?Chief Complaint  ?Patient presents with  ? Rectal Bleeding  ?  Pt had bleeding day before yesterday. He states it was for 2 days. No bleeding this morning  ? ? ?HPI:   ?[name] is a 71 y.o. male who presents to clinic today for follow-up visit.  Last seen December 2021.  Patient has a history of radiation proctitis, rectal bleeding, and adenomatous colon polyps. He has flex sig with APC treatment of radiation proctitis on 10/27/19 (actively bleeding during exam). He had APC at time of colonoscopy in 10/2018 as well. Next TCS due in 2025. Also with history of anemia likely secondary to chronic disease. Hemoglobin 12.2 on 09/01/21, consistent with baseline.  ? ?Today, patient complains of increased rectal bleeding.  Previously was occurring once every 1 to 2 weeks, now occurring 2-3 times a week.  States he had 2 straight days of rectal bleeding last week.  He is concerned about the increase in volume.  No rectal pain or discomfort.  Does chronically take Plavix. ? ?Past Medical History:  ?Diagnosis Date  ? Arthritis   ? Cerebrovascular disease   ? MRI shows Right carotid inferior cavernours narrowing 75% and  50-75% stenosis of Cavernous and supraclinoi right side  ? CKD (chronic kidney disease) 06/28/2014  ? Sees Dr Florene Glen  ? Diabetic retinopathy ([place])   ? Hyperlipidemia   ? Hypertension   ? Myocardial infarction [facility])   ? "mild" heart attack  ? Necrosis ([place])   ? #2 nail   ? Pneumonia   ? as a child  ? Poor circulation of extremity   ? Prostate cancer ([place]) 2017  ? Prostate  ? Sleep apnea   ? uses cpap, getting a new one  ? Type 2 Diabetes mellitus   ? Type 2  ? ? ?Past Surgical History:  ?Procedure Laterality Date  ? ABOVE KNEE LEG AMPUTATION Left 2004  ? started out below knee and then extended to above knee due to poor healing  ? AMPUTATION Right 04/28/2015  ? Procedure: AMPUTATION RAY, RIGHT 5TH  TOE;  Surgeon: Marybelle Killings, MD;  Location: [place];  Service: Orthopedics;  Laterality: Right;  ? AMPUTATION Right 05/10/2015  ? Procedure: Right Below Knee Amputation;  Surgeon: Marybelle Killings, MD;  Location: [place];  Service: Orthopedics;  Laterality: Right;  ? CATARACT EXTRACTION W/PHACO  09/05/2012  ? Procedure: CATARACT EXTRACTION PHACO AND INTRAOCULAR LENS PLACEMENT (IOC);  Surgeon: Tonny Branch, MD;  [facility];  Service: Ophthalmology;  Laterality: Left;  CDE=5.45  ? CATARACT EXTRACTION W/PHACO  10/03/2012  ? Procedure: CATARACT EXTRACTION PHACO AND INTRAOCULAR LENS PLACEMENT (IOC);  Surgeon: Tonny Branch, MD;  [facility]: [facility];  Service: Ophthalmology;  Laterality: Right;  CDE: 12.31  ? COLONOSCOPY  2011  ? Dr. Deatra Ina: colon polyps, tubular adenoma  ? COLONOSCOPY N/A 11/01/2018  ? Dr. Gala Romney: Blood noted in the rectal vault.  Vascular pattern of the rectum abnormal, neovascular changes distally and actively oozing.  There were 3 2 to 5 mm polyps in the splenic flexure, cecum removed.  Cecal polyp was not recovered.  Radiation proctitis status post APC treatment.  The splenic flexure polyps were tubular adenomas.  Next colonoscopy in 5 years.  ? FLEXIBLE SIGMOIDOSCOPY N/A 10/27/2019  ? Procedure: FLEXIBLE SIGMOIDOSCOPY;  Surgeon: Danie Binder, MD; rectal bleeding due to radiation proctitis  s/p APC therapy (actively bleeding during exam), rectosigmoid colon and sigmoid colon appeared normal.  ? POLYPECTOMY  11/01/2018  ? Procedure: POLYPECTOMY;  Surgeon: Daneil Dolin, MD;  [facility];  Service: Endoscopy;;  cecum,splenic flexure  ? REFRACTIVE SURGERY Left 08/01/2021  ? Cleaned cataract  ? ? ?Current Outpatient Medications  ?Medication Sig Dispense Refill  ? acetaminophen (TYLENOL) 325 MG tablet Take 650 mg by mouth daily as needed for mild pain, moderate pain or headache.    ? atenolol (TENORMIN) 25 MG tablet TAKE 1 TABLET BY MOUTH EVERY DAY 90 tablet 1  ? atorvastatin (LIPITOR) 80 MG tablet  TAKE 1 TABLET BY MOUTH EVERY DAY 90 tablet 1  ? B-D UF III MINI PEN NEEDLES 31G X 5 MM MISC USE FOUR TIMES DAILY AS DIRECTED 150 each 2  ? Calcium Carb-Cholecalciferol 600-500 MG-UNIT CAPS Take 2 capsules by mouth daily.     ? clopidogrel (PLAVIX) 75 MG tablet TAKE 1 TABLET BY MOUTH EVERY DAY 90 tablet 1  ? CONTOUR TEST test strip TEST TWICE DAILY E11.65 200 each 2  ? furosemide (LASIX) 20 MG tablet Take 20 mg by mouth 3 (three) times a week.    ? glucose blood test strip 1 each by Other route 2 (two) times daily. Use as instructed bid. E11.65 Contour next 300 each 2  ? insulin degludec (TRESIBA FLEXTOUCH) 100 UNIT/ML SOPN FlexTouch Pen Inject 30 Units into the skin at bedtime.    ? latanoprost (XALATAN) 0.005 % ophthalmic solution 1 drop at bedtime.    ? mirabegron ER (MYRBETRIQ) 25 MG TB24 tablet Take 1 tablet (25 mg total) by mouth every other day. 56 tablet 0  ? Semaglutide 0.25 or 0.5 MG/DOSE SOPN Inject 0.5 mg into the skin every Monday.     ? ?No current facility-administered medications for this visit.  ? ? ?Allergies as of 02/16/2022 - Review Complete 02/16/2022  ?Allergen Reaction Noted  ? Zolpidem tartrate Other (See Comments) 04/11/2011  ? ? ?Family History  ?Problem Relation Age of Onset  ? Diabetes Mother   ? Hypertension Mother   ? Cancer Father   ?     lung  ? Cancer Sister   ?     breast  ? Sickle cell anemia Daughter   ? Cancer Maternal Grandfather   ?     prostate  ? Colon cancer Neg Hx   ? ? ?Social History  ? ?Socioeconomic History  ? Marital status: Married  ?  Spouse name: Not on file  ? Number of children: 2  ? Years of education: college  ? Highest education level: Not on file  ?Occupational History  ? Occupation: Theme park manager  ?  Employer: [employer]  ?Tobacco Use  ? Smoking status: Never  ? Smokeless tobacco: Never  ?Vaping Use  ? Vaping Use: Never used  ?Substance and Sexual Activity  ? Alcohol use: No  ? Drug use: No  ? Sexual activity: Never  ?Other Topics Concern  ? Not on  file  ?Social History Narrative  ? Patient lives with his wife   ? Patient right handed  ? Patient drinks caffine on occ.  ? ?Social Determinants of Health  ? ?Financial Resource Strain: Not on file  ?Food Insecurity: Not on file  ?Transportation Needs: Not on file  ?Physical Activity: Not on file  ?Stress: Not on file  ?Social Connections: Not on file  ? ? ?Subjective: ?Review of Systems  ?Constitutional:  Negative  for chills and fever.  ?HENT:  Negative for congestion and hearing loss.   ?Eyes:  Negative for blurred vision and double vision.  ?Respiratory:  Negative for cough and shortness of breath.   ?Cardiovascular:  Negative for chest pain and palpitations.  ?Gastrointestinal:  Positive for blood in stool. Negative for abdominal pain, constipation, diarrhea, heartburn, melena and vomiting.  ?Genitourinary:  Negative for dysuria and urgency.  ?Musculoskeletal:  Negative for joint pain and myalgias.  ?Skin:  Negative for itching and rash.  ?Neurological:  Negative for dizziness and headaches.  ?Psychiatric/Behavioral:  Negative for depression. The patient is not nervous/anxious.   ? ? ?Objective: ?BP 130/68   Pulse 83   Temp 97.7 ?F (36.5 ?C)   Ht '5\' 11"'$  (1.803 m)   Wt 253 lb (114.8 kg)   BMI 35.29 kg/m?  ?Physical Exam ?Constitutional:   ?   Appearance: Normal appearance.  ?HENT:  ?   Head: Normocephalic and atraumatic.  ?Eyes:  ?   Extraocular Movements: Extraocular movements intact.  ?   Conjunctiva/sclera: Conjunctivae normal.  ?Cardiovascular:  ?   Rate and Rhythm: Normal rate and regular rhythm.  ?Pulmonary:  ?   Effort: Pulmonary effort is normal.  ?   Breath sounds: Normal breath sounds.  ?Abdominal:  ?   General: Bowel sounds are normal.  ?   Palpations: Abdomen is soft.  ?Musculoskeletal:     ?   General: Normal range of motion.  ?   Cervical back: Normal range of motion and neck supple.  ?Skin: ?   General: Skin is warm.  ?Neurological:  ?   General: No focal deficit present.  ?   Mental Status:  He is alert and oriented to person, place, and time.  ?Psychiatric:     ?   Mood and Affect: Mood normal.     ?   Behavior: Behavior normal.  ? ? ? ?Assessment: ?*Rectal bleeding-worsening ?*Adenomatous colon po

## 2022-02-16 NOTE — H&P (View-Only) (Signed)
? ? ?Referring Provider: Celene Squibb, MD ?Primary Care Physician:  Celene Squibb, MD ?Primary GI:  Dr. Abbey Chatters ? ?Chief Complaint  ?Patient presents with  ? Rectal Bleeding  ?  Pt had bleeding day before yesterday. He states it was for 2 days. No bleeding this morning  ? ? ?HPI:   ?[name] is a 71 y.o. male who presents to clinic today for follow-up visit.  Last seen December 2021.  Patient has a history of radiation proctitis, rectal bleeding, and adenomatous colon polyps. He has flex sig with APC treatment of radiation proctitis on 10/27/19 (actively bleeding during exam). He had APC at time of colonoscopy in 10/2018 as well. Next TCS due in 2025. Also with history of anemia likely secondary to chronic disease. Hemoglobin 12.2 on 09/01/21, consistent with baseline.  ? ?Today, patient complains of increased rectal bleeding.  Previously was occurring once every 1 to 2 weeks, now occurring 2-3 times a week.  States he had 2 straight days of rectal bleeding last week.  He is concerned about the increase in volume.  No rectal pain or discomfort.  Does chronically take Plavix. ? ?Past Medical History:  ?Diagnosis Date  ? Arthritis   ? Cerebrovascular disease   ? MRI shows Right carotid inferior cavernours narrowing 75% and  50-75% stenosis of Cavernous and supraclinoi right side  ? CKD (chronic kidney disease) 06/28/2014  ? Sees Dr Florene Glen  ? Diabetic retinopathy ([place])   ? Hyperlipidemia   ? Hypertension   ? Myocardial infarction [facility])   ? "mild" heart attack  ? Necrosis ([place])   ? #2 nail   ? Pneumonia   ? as a child  ? Poor circulation of extremity   ? Prostate cancer ([place]) 2017  ? Prostate  ? Sleep apnea   ? uses cpap, getting a new one  ? Type 2 Diabetes mellitus   ? Type 2  ? ? ?Past Surgical History:  ?Procedure Laterality Date  ? ABOVE KNEE LEG AMPUTATION Left 2004  ? started out below knee and then extended to above knee due to poor healing  ? AMPUTATION Right 04/28/2015  ? Procedure: AMPUTATION RAY, RIGHT 5TH  TOE;  Surgeon: Marybelle Killings, MD;  Location: [place];  Service: Orthopedics;  Laterality: Right;  ? AMPUTATION Right 05/10/2015  ? Procedure: Right Below Knee Amputation;  Surgeon: Marybelle Killings, MD;  Location: [place];  Service: Orthopedics;  Laterality: Right;  ? CATARACT EXTRACTION W/PHACO  09/05/2012  ? Procedure: CATARACT EXTRACTION PHACO AND INTRAOCULAR LENS PLACEMENT (IOC);  Surgeon: Tonny Branch, MD;  Location: [facility];  Service: Ophthalmology;  Laterality: Left;  CDE=5.45  ? CATARACT EXTRACTION W/PHACO  10/03/2012  ? Procedure: CATARACT EXTRACTION PHACO AND INTRAOCULAR LENS PLACEMENT (IOC);  Surgeon: Tonny Branch, MD;  Location: [facility];  Service: Ophthalmology;  Laterality: Right;  CDE: 12.31  ? COLONOSCOPY  2011  ? Dr. Deatra Ina: colon polyps, tubular adenoma  ? COLONOSCOPY N/A 11/01/2018  ? Dr. Gala Romney: Blood noted in the rectal vault.  Vascular pattern of the rectum abnormal, neovascular changes distally and actively oozing.  There were 3 2 to 5 mm polyps in the splenic flexure, cecum removed.  Cecal polyp was not recovered.  Radiation proctitis status post APC treatment.  The splenic flexure polyps were tubular adenomas.  Next colonoscopy in 5 years.  ? FLEXIBLE SIGMOIDOSCOPY N/A 10/27/2019  ? Procedure: FLEXIBLE SIGMOIDOSCOPY;  Surgeon: Danie Binder, MD; rectal bleeding due to radiation proctitis  s/p APC therapy (actively bleeding during exam), rectosigmoid colon and sigmoid colon appeared normal.  ? POLYPECTOMY  11/01/2018  ? Procedure: POLYPECTOMY;  Surgeon: Daneil Dolin, MD;  [facility];  Service: Endoscopy;;  cecum,splenic flexure  ? REFRACTIVE SURGERY Left 08/01/2021  ? Cleaned cataract  ? ? ?Current Outpatient Medications  ?Medication Sig Dispense Refill  ? acetaminophen (TYLENOL) 325 MG tablet Take 650 mg by mouth daily as needed for mild pain, moderate pain or headache.    ? atenolol (TENORMIN) 25 MG tablet TAKE 1 TABLET BY MOUTH EVERY DAY 90 tablet 1  ? atorvastatin (LIPITOR) 80 MG tablet  TAKE 1 TABLET BY MOUTH EVERY DAY 90 tablet 1  ? B-D UF III MINI PEN NEEDLES 31G X 5 MM MISC USE FOUR TIMES DAILY AS DIRECTED 150 each 2  ? Calcium Carb-Cholecalciferol 600-500 MG-UNIT CAPS Take 2 capsules by mouth daily.     ? clopidogrel (PLAVIX) 75 MG tablet TAKE 1 TABLET BY MOUTH EVERY DAY 90 tablet 1  ? CONTOUR TEST test strip TEST TWICE DAILY E11.65 200 each 2  ? furosemide (LASIX) 20 MG tablet Take 20 mg by mouth 3 (three) times a week.    ? glucose blood test strip 1 each by Other route 2 (two) times daily. Use as instructed bid. E11.65 Contour next 300 each 2  ? insulin degludec (TRESIBA FLEXTOUCH) 100 UNIT/ML SOPN FlexTouch Pen Inject 30 Units into the skin at bedtime.    ? latanoprost (XALATAN) 0.005 % ophthalmic solution 1 drop at bedtime.    ? mirabegron ER (MYRBETRIQ) 25 MG TB24 tablet Take 1 tablet (25 mg total) by mouth every other day. 56 tablet 0  ? Semaglutide 0.25 or 0.5 MG/DOSE SOPN Inject 0.5 mg into the skin every Monday.     ? ?No current facility-administered medications for this visit.  ? ? ?Allergies as of 02/16/2022 - Review Complete 02/16/2022  ?Allergen Reaction Noted  ? Zolpidem tartrate Other (See Comments) 04/11/2011  ? ? ?Family History  ?Problem Relation Age of Onset  ? Diabetes Mother   ? Hypertension Mother   ? Cancer Father   ?     lung  ? Cancer Sister   ?     breast  ? Sickle cell anemia Daughter   ? Cancer Maternal Grandfather   ?     prostate  ? Colon cancer Neg Hx   ? ? ?Social History  ? ?Socioeconomic History  ? Marital status: Married  ?  Spouse name: Not on file  ? Number of children: 2  ? Years of education: college  ? Highest education level: Not on file  ?Occupational History  ? Occupation: Theme park manager  ?  Employer: [employer]  ?Tobacco Use  ? Smoking status: Never  ? Smokeless tobacco: Never  ?Vaping Use  ? Vaping Use: Never used  ?Substance and Sexual Activity  ? Alcohol use: No  ? Drug use: No  ? Sexual activity: Never  ?Other Topics Concern  ? Not on  file  ?Social History Narrative  ? Patient lives with his wife   ? Patient right handed  ? Patient drinks caffine on occ.  ? ?Social Determinants of Health  ? ?Financial Resource Strain: Not on file  ?Food Insecurity: Not on file  ?Transportation Needs: Not on file  ?Physical Activity: Not on file  ?Stress: Not on file  ?Social Connections: Not on file  ? ? ?Subjective: ?Review of Systems  ?Constitutional:  Negative  for chills and fever.  ?HENT:  Negative for congestion and hearing loss.   ?Eyes:  Negative for blurred vision and double vision.  ?Respiratory:  Negative for cough and shortness of breath.   ?Cardiovascular:  Negative for chest pain and palpitations.  ?Gastrointestinal:  Positive for blood in stool. Negative for abdominal pain, constipation, diarrhea, heartburn, melena and vomiting.  ?Genitourinary:  Negative for dysuria and urgency.  ?Musculoskeletal:  Negative for joint pain and myalgias.  ?Skin:  Negative for itching and rash.  ?Neurological:  Negative for dizziness and headaches.  ?Psychiatric/Behavioral:  Negative for depression. The patient is not nervous/anxious.   ? ? ?Objective: ?BP 130/68   Pulse 83   Temp 97.7 ?F (36.5 ?C)   Ht '5\' 11"'$  (1.803 m)   Wt 253 lb (114.8 kg)   BMI 35.29 kg/m?  ?Physical Exam ?Constitutional:   ?   Appearance: Normal appearance.  ?HENT:  ?   Head: Normocephalic and atraumatic.  ?Eyes:  ?   Extraocular Movements: Extraocular movements intact.  ?   Conjunctiva/sclera: Conjunctivae normal.  ?Cardiovascular:  ?   Rate and Rhythm: Normal rate and regular rhythm.  ?Pulmonary:  ?   Effort: Pulmonary effort is normal.  ?   Breath sounds: Normal breath sounds.  ?Abdominal:  ?   General: Bowel sounds are normal.  ?   Palpations: Abdomen is soft.  ?Musculoskeletal:     ?   General: Normal range of motion.  ?   Cervical back: Normal range of motion and neck supple.  ?Skin: ?   General: Skin is warm.  ?Neurological:  ?   General: No focal deficit present.  ?   Mental Status:  He is alert and oriented to person, place, and time.  ?Psychiatric:     ?   Mood and Affect: Mood normal.     ?   Behavior: Behavior normal.  ? ? ? ?Assessment: ?*Rectal bleeding-worsening ?*Adenomatous colon po

## 2022-02-17 ENCOUNTER — Encounter: Payer: Self-pay | Admitting: *Deleted

## 2022-02-20 DIAGNOSIS — Z89619 Acquired absence of unspecified leg above knee: Secondary | ICD-10-CM | POA: Diagnosis not present

## 2022-02-20 DIAGNOSIS — E782 Mixed hyperlipidemia: Secondary | ICD-10-CM | POA: Diagnosis not present

## 2022-02-20 DIAGNOSIS — C61 Malignant neoplasm of prostate: Secondary | ICD-10-CM | POA: Diagnosis not present

## 2022-02-20 DIAGNOSIS — N1831 Chronic kidney disease, stage 3a: Secondary | ICD-10-CM | POA: Diagnosis not present

## 2022-02-20 DIAGNOSIS — E113291 Type 2 diabetes mellitus with mild nonproliferative diabetic retinopathy without macular edema, right eye: Secondary | ICD-10-CM | POA: Diagnosis not present

## 2022-02-20 DIAGNOSIS — H409 Unspecified glaucoma: Secondary | ICD-10-CM | POA: Diagnosis not present

## 2022-02-20 DIAGNOSIS — Z89511 Acquired absence of right leg below knee: Secondary | ICD-10-CM | POA: Diagnosis not present

## 2022-02-20 DIAGNOSIS — K625 Hemorrhage of anus and rectum: Secondary | ICD-10-CM | POA: Diagnosis not present

## 2022-02-20 DIAGNOSIS — E1165 Type 2 diabetes mellitus with hyperglycemia: Secondary | ICD-10-CM | POA: Diagnosis not present

## 2022-02-20 DIAGNOSIS — I10 Essential (primary) hypertension: Secondary | ICD-10-CM | POA: Diagnosis not present

## 2022-02-21 ENCOUNTER — Telehealth: Payer: Self-pay

## 2022-02-21 NOTE — Telephone Encounter (Signed)
Faxed documentation for [employer] Patient Assistance Program to [phone] for his NovoFine 32G pen needles, Ozempic 0.'5mg'$ , and Tresiba 100U/mL Flexpen. Received a confirmation that this went through. ?

## 2022-03-03 DIAGNOSIS — Z20822 Contact with and (suspected) exposure to covid-19: Secondary | ICD-10-CM | POA: Diagnosis not present

## 2022-03-08 NOTE — Patient Instructions (Signed)
? ? ? ? ? ? [name] ? 03/08/2022  ?  ? '@PREFPERIOPPHARMACY'$ @ ? ? Your procedure is scheduled on  03/13/2022. ? ? Report to [facility] at  1145  A.M. ? ? Call this number if you have problems the morning of surgery: ? [phone] ? ? Remember: ? Follow the diet and prep instructions given to you by the office. ? ? ?  Take 15 units of your night time insulin the night before your procedure. ? ? ?  DO NOT take any medications for diabetes the morning of your procedure. ?  ? ? Take these medicines the morning of surgery with A SIP OF WATER  ? ?                                         Atenolol. ?  ? Do not wear jewelry, make-up or nail polish. ? Do not wear lotions, powders, or perfumes, or deodorant. ? Do not shave 48 hours prior to surgery.  Men may shave face and neck. ? Do not bring valuables to the hospital. ? [facility] is not responsible for any belongings or valuables. ? ?Contacts, dentures or bridgework may not be worn into surgery.  Leave your suitcase in the car.  After surgery it may be brought to your room. ? ?For patients admitted to the hospital, discharge time will be determined by your treatment team. ? ?Patients discharged the day of surgery will not be allowed to drive home and must have someone with them for 24 hours.  ? ? ?Special instructions:   DO NOT smoke tobacco or vape for 24 hours before your procedure. ? ?Please read over the following fact sheets that you were given. ?Anesthesia Post-op Instructions and Care and Recovery After Surgery ?  ? ? ? Flexible Sigmoidoscopy, Care After ?This sheet gives you information about how to care for yourself after your procedure. Your health care provider may also give you more specific instructions. If you have problems or questions, contact your health care provider. ?What can I expect after the procedure? ?After the procedure, it is common to have: ?Cramping or pain in your abdomen. ?Bloating. ?A small amount of blood with your bowel movements. This  may happen if a sample of tissue was removed for testing (biopsy). ?Follow these instructions at home: ?Eating and drinking ? ?Drink enough fluid to keep your urine pale yellow. ?Follow instructions from your health care provider about eating or drinking restrictions. ?Resume your normal diet as instructed by your health care provider. Avoid heavy or fried foods that are hard to digest. ?Activity ? ?If you were given a medicine to help you relax (sedative) during the procedure, it can affect you for several hours. Do not drive or operate machinery until your health care provider says that it is safe. ?Rest as told by your health care provider. ?Return to your normal activities as told by your health care provider. Ask your health care provider what activities are safe for you. ?General instructions ?Take over-the-counter and prescription medicines only as told by your health care provider. ?Try walking around when you have cramps or feel bloated. ?Keep all follow-up visits as told by your health care provider. This is important. ?Contact a health care provider if: ?You have pain or cramping in your abdomen that gets worse or is not helped with medicine. ?You have a small  amount of bleeding from your rectum that continues after 24 hours. ?You have nausea or vomiting. ?You feel weak or dizzy. ?You develop a fever. ?Get help right away if: ?You pass large blood clots or see a large amount of blood in the toilet after having a bowel movement. ?You have severe pain in your abdomen. ?You have nausea or vomiting for more than 24 hours after the procedure. ?Summary ?After the procedure, you may have cramping or pain in your abdomen or you may have bloating. If you had a sample of tissue removed (biopsy), you may have a small amount of blood with your bowel movements. ?Resume your normal diet as instructed by your health care provider. Avoid heavy or fried foods that are hard to digest. ?Try walking around when you have  cramps or feel bloated. ?Get help right away if you pass large blood clots or see a large amount of blood in the toilet after having a bowel movement. ?This information is not intended to replace advice given to you by your health care provider. Make sure you discuss any questions you have with your health care provider. ?Document Revised: 10/13/2019 Document Reviewed: 10/13/2019 ?Elsevier Patient Education ? [place]. ?Monitored Anesthesia Care, Care After ?This sheet gives you information about how to care for yourself after your procedure. Your health care provider may also give you more specific instructions. If you have problems or questions, contact your health care provider. ?What can I expect after the procedure? ?After the procedure, it is common to have: ?Tiredness. ?Forgetfulness about what happened after the procedure. ?Impaired judgment for important decisions. ?Nausea or vomiting. ?Some difficulty with balance. ?Follow these instructions at home: ?For the time period you were told by your health care provider: ? ?  ? ?Rest as needed. ?Do not participate in activities where you could fall or become injured. ?Do not drive or use machinery. ?Do not drink alcohol. ?Do not take sleeping pills or medicines that cause drowsiness. ?Do not make important decisions or sign legal documents. ?Do not take care of children on your own. ?Eating and drinking ?Follow the diet that is recommended by your health care provider. ?Drink enough fluid to keep your urine pale yellow. ?If you vomit: ?Drink water, juice, or soup when you can drink without vomiting. ?Make sure you have little or no nausea before eating solid foods. ?General instructions ?Have a responsible adult stay with you for the time you are told. It is important to have someone help care for you until you are awake and alert. ?Take over-the-counter and prescription medicines only as told by your health care provider. ?If you have sleep apnea,  surgery and certain medicines can increase your risk for breathing problems. Follow instructions from your health care provider about wearing your sleep device: ?Anytime you are sleeping, including during daytime naps. ?While taking prescription pain medicines, sleeping medicines, or medicines that make you drowsy. ?Avoid smoking. ?Keep all follow-up visits as told by your health care provider. This is important. ?Contact a health care provider if: ?You keep feeling nauseous or you keep vomiting. ?You feel light-headed. ?You are still sleepy or having trouble with balance after 24 hours. ?You develop a rash. ?You have a fever. ?You have redness or swelling around the IV site. ?Get help right away if: ?You have trouble breathing. ?You have new-onset confusion at home. ?Summary ?For several hours after your procedure, you may feel tired. You may also be forgetful and have poor judgment. ?Have a  responsible adult stay with you for the time you are told. It is important to have someone help care for you until you are awake and alert. ?Rest as told. Do not drive or operate machinery. Do not drink alcohol or take sleeping pills. ?Get help right away if you have trouble breathing, or if you suddenly become confused. ?This information is not intended to replace advice given to you by your health care provider. Make sure you discuss any questions you have with your health care provider. ?Document Revised: 09/20/2021 Document Reviewed: 09/18/2019 ?Elsevier Patient Education ? [place]. ? ?

## 2022-03-09 ENCOUNTER — Other Ambulatory Visit: Payer: Medicare Other

## 2022-03-09 ENCOUNTER — Encounter (HOSPITAL_COMMUNITY)
Admission: RE | Admit: 2022-03-09 | Discharge: 2022-03-09 | Disposition: A | Discharge status: Home / Self Care | Payer: Medicare Other | Source: Ambulatory Visit | Attending: Internal Medicine | Admitting: Internal Medicine

## 2022-03-09 VITALS — BP 122/63 | HR 80 | Temp 97.7°F | Resp 18 | Ht 71.0 in | Wt 253.0 lb

## 2022-03-09 DIAGNOSIS — E1159 Type 2 diabetes mellitus with other circulatory complications: Secondary | ICD-10-CM | POA: Insufficient documentation

## 2022-03-09 DIAGNOSIS — N182 Chronic kidney disease, stage 2 (mild): Secondary | ICD-10-CM | POA: Insufficient documentation

## 2022-03-09 DIAGNOSIS — D649 Anemia, unspecified: Secondary | ICD-10-CM | POA: Insufficient documentation

## 2022-03-09 DIAGNOSIS — E1122 Type 2 diabetes mellitus with diabetic chronic kidney disease: Secondary | ICD-10-CM | POA: Diagnosis not present

## 2022-03-09 DIAGNOSIS — I10 Essential (primary) hypertension: Secondary | ICD-10-CM | POA: Diagnosis not present

## 2022-03-09 DIAGNOSIS — Z01818 Encounter for other preprocedural examination: Secondary | ICD-10-CM | POA: Insufficient documentation

## 2022-03-09 DIAGNOSIS — Z794 Long term (current) use of insulin: Secondary | ICD-10-CM | POA: Insufficient documentation

## 2022-03-09 DIAGNOSIS — N1832 Chronic kidney disease, stage 3b: Secondary | ICD-10-CM | POA: Diagnosis not present

## 2022-03-09 DIAGNOSIS — C61 Malignant neoplasm of prostate: Secondary | ICD-10-CM | POA: Diagnosis not present

## 2022-03-09 LAB — CBC WITH DIFFERENTIAL/PLATELET
Abs Immature Granulocytes: 0.01 10*3/uL (ref 0.00–0.07)
Basophils Absolute: 0.1 10*3/uL (ref 0.0–0.1)
Basophils Relative: 1 %
Eosinophils Absolute: 0.2 10*3/uL (ref 0.0–0.5)
Eosinophils Relative: 3 %
HCT: 33.1 % — ABNORMAL LOW (ref 39.0–52.0)
Hemoglobin: 11 g/dL — ABNORMAL LOW (ref 13.0–17.0)
Immature Granulocytes: 0 %
Lymphocytes Relative: 24 %
Lymphs Abs: 1.4 10*3/uL (ref 0.7–4.0)
MCH: 30.1 pg (ref 26.0–34.0)
MCHC: 33.2 g/dL (ref 30.0–36.0)
MCV: 90.7 fL (ref 80.0–100.0)
Monocytes Absolute: 0.5 10*3/uL (ref 0.1–1.0)
Monocytes Relative: 9 %
Neutro Abs: 3.7 10*3/uL (ref 1.7–7.7)
Neutrophils Relative %: 63 %
Platelets: 234 10*3/uL (ref 150–400)
RBC: 3.65 MIL/uL — ABNORMAL LOW (ref 4.22–5.81)
RDW: 14.8 % (ref 11.5–15.5)
WBC: 5.9 10*3/uL (ref 4.0–10.5)
nRBC: 0 % (ref 0.0–0.2)

## 2022-03-09 LAB — BASIC METABOLIC PANEL
Anion gap: 9 (ref 5–15)
BUN: 31 mg/dL — ABNORMAL HIGH (ref 8–23)
CO2: 20 mmol/L — ABNORMAL LOW (ref 22–32)
Calcium: 8.6 mg/dL — ABNORMAL LOW (ref 8.9–10.3)
Chloride: 112 mmol/L — ABNORMAL HIGH (ref 98–111)
Creatinine, Ser: 1.7 mg/dL — ABNORMAL HIGH (ref 0.61–1.24)
GFR, Estimated: 43 mL/min — ABNORMAL LOW (ref >60)
Glucose, Bld: 120 mg/dL — ABNORMAL HIGH (ref 70–99)
Potassium: 4.1 mmol/L (ref 3.5–5.1)
Sodium: 141 mmol/L (ref 135–145)

## 2022-03-10 LAB — PSA: Prostate Specific Ag, Serum: 0.2 ng/mL (ref 0.0–4.0)

## 2022-03-10 LAB — TESTOSTERONE: Testosterone: 10 ng/dL — ABNORMAL LOW (ref 264–916)

## 2022-03-13 ENCOUNTER — Encounter (HOSPITAL_COMMUNITY): Payer: Self-pay

## 2022-03-13 ENCOUNTER — Ambulatory Visit (HOSPITAL_COMMUNITY)
Admission: RE | Admit: 2022-03-13 | Discharge: 2022-03-13 | Disposition: A | Discharge status: Home / Self Care | Payer: Medicare Other | Source: Ambulatory Visit | Attending: Internal Medicine | Admitting: Internal Medicine

## 2022-03-13 ENCOUNTER — Ambulatory Visit (HOSPITAL_BASED_OUTPATIENT_CLINIC_OR_DEPARTMENT_OTHER): Payer: Medicare Other | Admitting: Anesthesiology

## 2022-03-13 ENCOUNTER — Other Ambulatory Visit: Payer: Self-pay

## 2022-03-13 ENCOUNTER — Ambulatory Visit (HOSPITAL_COMMUNITY): Payer: Medicare Other | Admitting: Anesthesiology

## 2022-03-13 ENCOUNTER — Encounter (HOSPITAL_COMMUNITY)
Admission: RE | Disposition: A | Discharge status: Home / Self Care | Payer: Self-pay | Source: Ambulatory Visit | Attending: Internal Medicine

## 2022-03-13 DIAGNOSIS — M1990 Unspecified osteoarthritis, unspecified site: Secondary | ICD-10-CM | POA: Insufficient documentation

## 2022-03-13 DIAGNOSIS — Z7984 Long term (current) use of oral hypoglycemic drugs: Secondary | ICD-10-CM | POA: Insufficient documentation

## 2022-03-13 DIAGNOSIS — K5521 Angiodysplasia of colon with hemorrhage: Secondary | ICD-10-CM

## 2022-03-13 DIAGNOSIS — N189 Chronic kidney disease, unspecified: Secondary | ICD-10-CM | POA: Insufficient documentation

## 2022-03-13 DIAGNOSIS — D649 Anemia, unspecified: Secondary | ICD-10-CM | POA: Insufficient documentation

## 2022-03-13 DIAGNOSIS — Z8719 Personal history of other diseases of the digestive system: Secondary | ICD-10-CM | POA: Insufficient documentation

## 2022-03-13 DIAGNOSIS — E1151 Type 2 diabetes mellitus with diabetic peripheral angiopathy without gangrene: Secondary | ICD-10-CM | POA: Diagnosis not present

## 2022-03-13 DIAGNOSIS — K625 Hemorrhage of anus and rectum: Secondary | ICD-10-CM | POA: Diagnosis not present

## 2022-03-13 DIAGNOSIS — I129 Hypertensive chronic kidney disease with stage 1 through stage 4 chronic kidney disease, or unspecified chronic kidney disease: Secondary | ICD-10-CM | POA: Insufficient documentation

## 2022-03-13 DIAGNOSIS — E1122 Type 2 diabetes mellitus with diabetic chronic kidney disease: Secondary | ICD-10-CM | POA: Insufficient documentation

## 2022-03-13 DIAGNOSIS — G4730 Sleep apnea, unspecified: Secondary | ICD-10-CM | POA: Diagnosis not present

## 2022-03-13 DIAGNOSIS — Z794 Long term (current) use of insulin: Secondary | ICD-10-CM

## 2022-03-13 DIAGNOSIS — D759 Disease of blood and blood-forming organs, unspecified: Secondary | ICD-10-CM | POA: Diagnosis not present

## 2022-03-13 DIAGNOSIS — E11319 Type 2 diabetes mellitus with unspecified diabetic retinopathy without macular edema: Secondary | ICD-10-CM

## 2022-03-13 DIAGNOSIS — Z89511 Acquired absence of right leg below knee: Secondary | ICD-10-CM | POA: Diagnosis not present

## 2022-03-13 DIAGNOSIS — I10 Essential (primary) hypertension: Secondary | ICD-10-CM | POA: Diagnosis not present

## 2022-03-13 DIAGNOSIS — I252 Old myocardial infarction: Secondary | ICD-10-CM

## 2022-03-13 DIAGNOSIS — Z89612 Acquired absence of left leg above knee: Secondary | ICD-10-CM | POA: Insufficient documentation

## 2022-03-13 DIAGNOSIS — Z7902 Long term (current) use of antithrombotics/antiplatelets: Secondary | ICD-10-CM | POA: Diagnosis not present

## 2022-03-13 DIAGNOSIS — Z923 Personal history of irradiation: Secondary | ICD-10-CM | POA: Insufficient documentation

## 2022-03-13 DIAGNOSIS — Z86010 Personal history of colonic polyps: Secondary | ICD-10-CM | POA: Insufficient documentation

## 2022-03-13 DIAGNOSIS — E1159 Type 2 diabetes mellitus with other circulatory complications: Secondary | ICD-10-CM

## 2022-03-13 HISTORY — PX: FLEXIBLE SIGMOIDOSCOPY: SHX5431

## 2022-03-13 HISTORY — PX: HOT HEMOSTASIS: SHX5433

## 2022-03-13 LAB — GLUCOSE, CAPILLARY: Glucose-Capillary: 113 mg/dL — ABNORMAL HIGH (ref 70–99)

## 2022-03-13 SURGERY — SIGMOIDOSCOPY, FLEXIBLE
Anesthesia: General

## 2022-03-13 MED ORDER — LACTATED RINGERS IV SOLN: INTRAVENOUS | Status: DC

## 2022-03-13 MED ORDER — LIDOCAINE HCL (CARDIAC) PF 100 MG/5ML IV SOSY
PREFILLED_SYRINGE | INTRAVENOUS | Status: DC | PRN
  Administered 2022-03-13: 50 mg via INTRAVENOUS

## 2022-03-13 MED ORDER — PROPOFOL 10 MG/ML IV BOLUS
INTRAVENOUS | Status: DC | PRN
  Administered 2022-03-13: 100 mg via INTRAVENOUS
  Administered 2022-03-13 (×2): 30 mg via INTRAVENOUS

## 2022-03-13 NOTE — Transfer of Care (Signed)
Immediate Anesthesia Transfer of Care Note ? ?Patient: [name] ? ?Procedure(s) Performed: FLEXIBLE SIGMOIDOSCOPY ?HOT HEMOSTASIS (ARGON PLASMA COAGULATION/BICAP) ? ?Patient Location: Short Stay ? ?Anesthesia Type:General ? ?Level of Consciousness: awake ? ?Airway & Oxygen Therapy: Patient Spontanous Breathing ? ?Post-op Assessment: Report given to RN and Post -op Vital signs reviewed and stable ? ?Post vital signs: Reviewed and stable ? ?Last Vitals:  ?Vitals Value Taken Time  ?BP    ?Temp    ?Pulse    ?Resp    ?SpO2    ? ? ?Last Pain:  ?Vitals:  ? 03/13/22 1209  ?TempSrc:   ?PainSc: 0-No pain  ?   ? ?  ? ?Complications: No notable events documented. ?

## 2022-03-13 NOTE — Addendum Note (Signed)
Addendum  created 03/13/22 1305 by Karna Dupes, CRNA  ? Charge Capture section accepted  ?  ?

## 2022-03-13 NOTE — Interval H&P Note (Signed)
History and Physical Interval Note: ? ?03/13/2022 ?11:21 AM ? ?[name]  has presented today for surgery, with the diagnosis of rb.  The various methods of treatment have been discussed with the patient and family. After consideration of risks, benefits and other options for treatment, the patient has consented to  Procedure(s) with comments: ?FLEXIBLE SIGMOIDOSCOPY (N/A) - 1:45pm as a surgical intervention.  The patient's history has been reviewed, patient examined, no change in status, stable for surgery.  I have reviewed the patient's chart and labs.  Questions were answered to the patient's satisfaction.   ? ? ?Eloise Harman ? ? ?

## 2022-03-13 NOTE — Anesthesia Preprocedure Evaluation (Signed)
Anesthesia Evaluation  ?Patient identified by MRN, date of birth, ID band ?Patient awake ? ? ? ?Reviewed: ?Allergy & Precautions, NPO status , Patient's Chart, lab work & pertinent test results ? ?Airway ?Mallampati: II ? ?TM Distance: >3 FB ?Neck ROM: Full ? ? ? Dental ? ?(+) Dental Advisory Given, Edentulous Upper, Missing ?  ?Pulmonary ?sleep apnea and Continuous Positive Airway Pressure Ventilation , pneumonia,  ?  ?Pulmonary exam normal ?breath sounds clear to auscultation ? ? ? ? ? ? Cardiovascular ?Exercise Tolerance: Good ?hypertension, Pt. on home beta blockers and Pt. on medications ?+ Past MI and + Peripheral Vascular Disease  ?Normal cardiovascular exam ?Rhythm:Regular Rate:Normal ? ? ?  ?Neuro/Psych ?negative neurological ROS ? negative psych ROS  ? GI/Hepatic ?negative GI ROS, Neg liver ROS,   ?Endo/Other  ?diabetes, Well Controlled, Type 2, Insulin Dependent, Oral Hypoglycemic Agents ? Renal/GU ?Renal InsufficiencyRenal disease  ? ?Prostate cancer ? ?  ?Musculoskeletal ? ?(+) Arthritis , Osteoarthritis,   ? Abdominal ?  ?Peds ?negative pediatric ROS ?(+)  Hematology ? ?(+) Blood dyscrasia, anemia ,   ?Anesthesia Other Findings ?Left  AKA, right BKA ? Reproductive/Obstetrics ?negative OB ROS ? ?  ? ? ? ? ? ? ? ? ? ? ? ? ? ?  ?  ? ? ? ? ? ? ?Anesthesia Physical ?Anesthesia Plan ? ?ASA: 3 ? ?Anesthesia Plan: General  ? ?Post-op Pain Management: Minimal or no pain anticipated  ? ?Induction: Intravenous ? ?PONV Risk Score and Plan: Propofol infusion ? ?Airway Management Planned: Nasal Cannula and Natural Airway ? ?Additional Equipment:  ? ?Intra-op Plan:  ? ?Post-operative Plan:  ? ?Informed Consent: I have reviewed the patients History and Physical, chart, labs and discussed the procedure including the risks, benefits and alternatives for the proposed anesthesia with the patient or authorized representative who has indicated his/her understanding and acceptance.   ? ? ? ?Dental advisory given ? ?Plan Discussed with: CRNA and Surgeon ? ?Anesthesia Plan Comments:   ? ? ? ? ? ? ?Anesthesia Quick Evaluation ? ?

## 2022-03-13 NOTE — Discharge Instructions (Addendum)
?  Flexible sigmoidoscopy ?Discharge Instructions ? ?Read the instructions outlined below and refer to this sheet in the next few weeks. These discharge instructions provide you with general information on caring for yourself after you leave the hospital. Your doctor may also give you specific instructions. While your treatment has been planned according to the most current medical practices available, unavoidable complications occasionally occur.  ? ?ACTIVITY ?You may resume your regular activity, but move at a slower pace for the next 24 hours.  ?Take frequent rest periods for the next 24 hours.  ?Walking will help get rid of the air and reduce the bloated feeling in your belly (abdomen).  ?No driving for 24 hours (because of the medicine (anesthesia) used during the test).   ?Do not sign any important legal documents or operate any machinery for 24 hours (because of the anesthesia used during the test).  ?NUTRITION ?Drink plenty of fluids.  ?You may resume your normal diet as instructed by your doctor.  ?Begin with a light meal and progress to your normal diet. Heavy or fried foods are harder to digest and may make you feel sick to your stomach (nauseated).  ?Avoid alcoholic beverages for 24 hours or as instructed.  ?MEDICATIONS ?You may resume your normal medications unless your doctor tells you otherwise.  ?WHAT YOU CAN EXPECT TODAY ?Some feelings of bloating in the abdomen.  ?Passage of more gas than usual.  ?Spotting of blood in your stool or on the toilet paper.  ?IF YOU HAD POLYPS REMOVED DURING THE COLONOSCOPY: ?No aspirin products for 7 days or as instructed.  ?No alcohol for 7 days or as instructed.  ?Eat a soft diet for the next 24 hours.  ?FINDING OUT THE RESULTS OF YOUR TEST ?Not all test results are available during your visit. If your test results are not back during the visit, make an appointment with your caregiver to find out the results. Do not assume everything is normal if you have not heard  from your caregiver or the medical facility. It is important for you to follow up on all of your test results.  ?SEEK IMMEDIATE MEDICAL ATTENTION IF: ?You have more than a spotting of blood in your stool.  ?Your belly is swollen (abdominal distention).  ?You are nauseated or vomiting.  ?You have a temperature over 101.  ?You have abdominal pain or discomfort that is severe or gets worse throughout the day.  ? ?You had multiple actively bleeding AVMs in your sigmoid colon and rectum.  I treated all of these lesions with coagulation.  Bleeding has stopped.  Unfortunately, these lesions are tricky and could bleed again in the future.  We will continue to monitor for now.  Hold Plavix x2 more days.  Follow-up with Dr. Abbey Chatters in 3 to 4 months. ? ?I hope you have a great rest of your week! ? ?Elon Alas. Abbey Chatters, D.O. ?Gastroenterology and Hepatology ?[facility] ? ?

## 2022-03-13 NOTE — Op Note (Signed)
[facility] ?Patient Name: [name] ?Procedure Date: 03/13/2022 12:02 PM ?MRN: [id number] ?Date of Birth: 05-08-1951 ?Attending MD: Elon Alas. Abbey Chatters , DO ?CSN: [id number] ?Age: 71 ?Admit Type: Outpatient ?Procedure:                Flexible Sigmoidoscopy ?Indications:              Rectal hemorrhage ?Providers:                Elon Alas. Abbey Chatters, DO, Janeece Riggers, RN, Caprice Kluver ?Referring MD:              ?Medicines:                See the Anesthesia note for documentation of the  ?                          administered medications ?Complications:            No immediate complications. ?Estimated Blood Loss:     Estimated blood loss was minimal. ?Procedure:                Pre-Anesthesia Assessment: ?                          - The anesthesia plan was to use monitored  ?                          anesthesia care (MAC). ?                          After obtaining informed consent, the scope was  ?                          passed under direct vision. The PCF-HQ190L  ?                          ([id number]) scope was introduced through the anus and  ?                          advanced to the the descending colon. The flexible  ?                          sigmoidoscopy was accomplished without difficulty.  ?                          The patient tolerated the procedure well. The  ?                          quality of the bowel preparation was good. ?Scope In: 12:12:07 PM ?Scope Out: 12:20:38 PM ?Total Procedure Duration: 0 hours 8 minutes 31 seconds  ?Findings: ?     The perianal and digital rectal examinations were normal. ?     A single angiodysplastic lesion with bleeding was found in the sigmoid  ?     colon. Coagulation for hemostasis using argon plasma was successful. ?     Multiple angiodysplastic lesions with bleeding were found in the rectum  ?     just proximal to dentate line.. Coagulation for hemostasis using argon  ?  plasma was successful. ?Impression:               - A single bleeding colonic angiodysplastic  lesion.  ?                          Treated with argon plasma coagulation (APC). ?                          - Multiple bleeding colonic angiodysplastic  ?                          lesions. Treated with argon plasma coagulation  ?                          (APC). ?                          - No specimens collected. ?Moderate Sedation: ?     Per Anesthesia Care ?Recommendation:           - Discharge patient to home. ?                          - Patient has a contact number available for  ?                          emergencies. The signs and symptoms of potential  ?                          delayed complications were discussed with the  ?                          patient. Return to normal activities tomorrow.  ?                          Written discharge instructions were provided to the  ?                          patient. ?                          - Resume previous diet. ?                          - Hold Plavix x2 more days ?                          - Return to GI clinic in 4 months. ?Procedure Code(s):        --- Professional --- ?                          [phone], Sigmoidoscopy, flexible; with control of  ?                          bleeding, any method ?Diagnosis Code(s):        --- Professional --- ?  K55.21, Angiodysplasia of colon with hemorrhage ?                          K62.5, Hemorrhage of anus and rectum ?CPT copyright 2019 American Medical Association. All rights reserved. ?The codes documented in this report are preliminary and upon coder review may  ?be revised to meet current compliance requirements. ?Elon Alas. Abbey Chatters, DO ?Elon Alas. Crystal, DO ?03/13/2022 12:26:14 PM ?This report has been signed electronically. ?Number of Addenda: 0 ?

## 2022-03-13 NOTE — Anesthesia Postprocedure Evaluation (Signed)
Anesthesia Post Note ? ?Patient: [name] ? ?Procedure(s) Performed: FLEXIBLE SIGMOIDOSCOPY ?HOT HEMOSTASIS (ARGON PLASMA COAGULATION/BICAP) ? ?Patient location during evaluation: Phase II ?Anesthesia Type: General ?Level of consciousness: awake and alert and oriented ?Pain management: pain level controlled ?Vital Signs Assessment: post-procedure vital signs reviewed and stable ?Respiratory status: spontaneous breathing, nonlabored ventilation and respiratory function stable ?Cardiovascular status: blood pressure returned to baseline and stable ?Postop Assessment: no apparent nausea or vomiting ?Anesthetic complications: no ? ? ?No notable events documented. ? ? ?Last Vitals:  ?Vitals:  ? 03/13/22 1116 03/13/22 1223  ?BP: 115/73 96/60  ?Pulse: 64 82  ?Resp: 16 (!) 25  ?Temp: 36.7 ?C 36.7 ?C  ?SpO2: 98% 94%  ?  ?Last Pain:  ?Vitals:  ? 03/13/22 1209  ?TempSrc:   ?PainSc: 0-No pain  ? ? ?  ?  ?  ?  ?  ?  ? ?Luisa Louk C Nancey Kreitz ? ? ? ? ?

## 2022-03-16 ENCOUNTER — Ambulatory Visit (INDEPENDENT_AMBULATORY_CARE_PROVIDER_SITE_OTHER): Payer: Medicare Other | Admitting: Urology

## 2022-03-16 ENCOUNTER — Encounter: Payer: Self-pay | Admitting: Urology

## 2022-03-16 VITALS — BP 120/73 | HR 85 | Ht 71.5 in | Wt 251.0 lb

## 2022-03-16 DIAGNOSIS — Z8546 Personal history of malignant neoplasm of prostate: Secondary | ICD-10-CM | POA: Diagnosis not present

## 2022-03-16 DIAGNOSIS — R351 Nocturia: Secondary | ICD-10-CM

## 2022-03-16 DIAGNOSIS — R3129 Other microscopic hematuria: Secondary | ICD-10-CM | POA: Diagnosis not present

## 2022-03-16 DIAGNOSIS — R9721 Rising PSA following treatment for malignant neoplasm of prostate: Secondary | ICD-10-CM | POA: Diagnosis not present

## 2022-03-16 DIAGNOSIS — N3941 Urge incontinence: Secondary | ICD-10-CM

## 2022-03-16 DIAGNOSIS — R972 Elevated prostate specific antigen [PSA]: Secondary | ICD-10-CM | POA: Diagnosis not present

## 2022-03-16 DIAGNOSIS — C61 Malignant neoplasm of prostate: Secondary | ICD-10-CM

## 2022-03-16 LAB — URINALYSIS, ROUTINE W REFLEX MICROSCOPIC
Bilirubin, UA: NEGATIVE
Glucose, UA: NEGATIVE
Ketones, UA: NEGATIVE
Leukocytes,UA: NEGATIVE
Nitrite, UA: NEGATIVE
Protein,UA: NEGATIVE
Specific Gravity, UA: 1.015 (ref 1.005–1.030)
Urobilinogen, Ur: 0.2 mg/dL (ref 0.2–1.0)
pH, UA: 5.5 (ref 5.0–7.5)

## 2022-03-16 LAB — MICROSCOPIC EXAMINATION
Epithelial Cells (non renal): NONE SEEN /hpf (ref 0–10)
Renal Epithel, UA: NONE SEEN /hpf
WBC, UA: NONE SEEN /hpf (ref 0–5)

## 2022-03-16 MED ORDER — LEUPROLIDE ACETATE (6 MONTH) 45 MG ~~LOC~~ KIT
45.0000 mg | PACK | Freq: Once | SUBCUTANEOUS | Status: AC
  Administered 2022-03-16: 45 mg via SUBCUTANEOUS

## 2022-03-16 MED ORDER — LEUPROLIDE ACETATE (6 MONTH) 45 MG ~~LOC~~ KIT: 45.0000 mg | PACK | Freq: Once | SUBCUTANEOUS | Status: DC

## 2022-03-16 NOTE — Progress Notes (Signed)
Eligard SubQ Injection   Due to Prostate Cancer patient is present today for a Eligard Injection.  Medication: Eligard 6 month Dose: 45 mg  Location: left  Lot: [id number] Exp: 07/01/2023  Patient tolerated well, no complications were noted  Performed by: Levi Aland, CMA   Patient provided with a month's supply of Myrbetriq '25mg'$  samples.

## 2022-03-16 NOTE — Progress Notes (Signed)
Subjective:  1. Prostate cancer ([place])   2. Rising PSA following treatment for malignant neoplasm of prostate   3. Urge incontinence   4. Nocturia   5. Microhematuria     [name] is a 71yo here for followup for a history of T3 N1 M0 Gleason 9 prostate cancer with right external iliac nodal disease at diagnosis in 2018 and urge incontinence. He had radiation in 2018.  He has mild LUTS and mild urgency with mirabegron '25mg'$  every other day.  He has nocturia x 1.  He has had no hematuria but his UA today has 3-10 RBC's. PSA is 0.2  and his Testosterone is 10 in 5/22. He is due for eligard today.  UA is clear today.   He has no weight loss or bone pain and he has rare hot flashes.  He had endoscopy on Monday for rectal bleeding from radiation cystitis and had some fulguration.  His Cr is 1.7 with a GFR of 43.   He has not had any abdominal imaging since 2018.       ROS:  ROS:  A complete review of systems was performed.  All systems are negative except for pertinent findings as noted.   Review of Systems  All other systems reviewed and are negative.  Allergies  Allergen Reactions   Zolpidem Tartrate Other (See Comments)    disorientation     Outpatient Encounter Medications as of 03/16/2022  Medication Sig   acetaminophen (TYLENOL) 325 MG tablet Take 650 mg by mouth daily as needed for mild pain, moderate pain or headache.   atenolol (TENORMIN) 25 MG tablet TAKE 1 TABLET BY MOUTH EVERY DAY   atorvastatin (LIPITOR) 80 MG tablet TAKE 1 TABLET BY MOUTH EVERY DAY   B-D UF III MINI PEN NEEDLES 31G X 5 MM MISC USE FOUR TIMES DAILY AS DIRECTED   Calcium Carb-Cholecalciferol 600-500 MG-UNIT CAPS Take 2 capsules by mouth daily.    clopidogrel (PLAVIX) 75 MG tablet TAKE 1 TABLET BY MOUTH EVERY DAY   CONTOUR TEST test strip TEST TWICE DAILY E11.65   furosemide (LASIX) 20 MG tablet Take 20 mg by mouth 3 (three) times a week.   glucose blood test strip 1 each by Other route 2 (two) times daily.  Use as instructed bid. E11.65 Contour next   insulin degludec (TRESIBA FLEXTOUCH) 100 UNIT/ML SOPN FlexTouch Pen Inject 30 Units into the skin at bedtime.   latanoprost (XALATAN) 0.005 % ophthalmic solution Place 1 drop into both eyes at bedtime.   mirabegron ER (MYRBETRIQ) 25 MG TB24 tablet Take 1 tablet (25 mg total) by mouth every other day.   Semaglutide 0.25 or 0.5 MG/DOSE SOPN Inject 0.5 mg into the skin every Monday.    [EXPIRED] leuprolide (6 Month) (ELIGARD) injection 45 mg    [DISCONTINUED] leuprolide (6 Month) (ELIGARD) injection 45 mg    No facility-administered encounter medications on file as of 03/16/2022.    Past Medical History:  Diagnosis Date   Arthritis    Cerebrovascular disease    MRI shows Right carotid inferior cavernours narrowing 75% and  50-75% stenosis of Cavernous and supraclinoi right side   CKD (chronic kidney disease) 06/28/2014   Sees Dr Florene Glen   Diabetic retinopathy [facility])    Hyperlipidemia    Hypertension    Myocardial infarction [facility])    "mild" heart attack   Necrosis ([place])    #2 nail    Pneumonia    as a child   Poor circulation  of extremity    Prostate cancer ([place]) 2017   Prostate   Sleep apnea    uses cpap, getting a new one   Type 2 Diabetes mellitus    Type 2    Past Surgical History:  Procedure Laterality Date   ABOVE KNEE LEG AMPUTATION Left 2004   started out below knee and then extended to above knee due to poor healing   AMPUTATION Right 04/28/2015   Procedure: AMPUTATION RAY, RIGHT 5TH TOE;  Surgeon: Marybelle Killings, MD;  Location: [place];  Service: Orthopedics;  Laterality: Right;   AMPUTATION Right 05/10/2015   Procedure: Right Below Knee Amputation;  Surgeon: Marybelle Killings, MD;  Location: [place];  Service: Orthopedics;  Laterality: Right;   CATARACT EXTRACTION W/PHACO  09/05/2012   Procedure: CATARACT EXTRACTION PHACO AND INTRAOCULAR LENS PLACEMENT (IOC);  Surgeon: Tonny Branch, MD;  Location: [facility];  Service: Ophthalmology;   Laterality: Left;  CDE=5.45   CATARACT EXTRACTION W/PHACO  10/03/2012   Procedure: CATARACT EXTRACTION PHACO AND INTRAOCULAR LENS PLACEMENT (IOC);  Surgeon: Tonny Branch, MD;  Location: [facility];  Service: Ophthalmology;  Laterality: Right;  CDE: 12.31   COLONOSCOPY  2011   Dr. Deatra Ina: colon polyps, tubular adenoma   COLONOSCOPY N/A 11/01/2018   Dr. Gala Romney: Blood noted in the rectal vault.  Vascular pattern of the rectum abnormal, neovascular changes distally and actively oozing.  There were 3 2 to 5 mm polyps in the splenic flexure, cecum removed.  Cecal polyp was not recovered.  Radiation proctitis status post APC treatment.  The splenic flexure polyps were tubular adenomas.  Next colonoscopy in 5 years.   FLEXIBLE SIGMOIDOSCOPY N/A 10/27/2019   Procedure: FLEXIBLE SIGMOIDOSCOPY;  Surgeon: Danie Binder, MD; rectal bleeding due to radiation proctitis s/p APC therapy (actively bleeding during exam), rectosigmoid colon and sigmoid colon appeared normal.   POLYPECTOMY  11/01/2018   Procedure: POLYPECTOMY;  Surgeon: Daneil Dolin, MD;  [facility];  Service: Endoscopy;;  cecum,splenic flexure   REFRACTIVE SURGERY Left 08/01/2021   Cleaned cataract    Social History   Socioeconomic History   Marital status: Married    Spouse name: Not on file   Number of children: 2   Years of education: college   Highest education level: Not on file  Occupational History   Occupation: Mining engineer: [employer]  Tobacco Use   Smoking status: Never   Smokeless tobacco: Never  Vaping Use   Vaping Use: Never used  Substance and Sexual Activity   Alcohol use: No   Drug use: No   Sexual activity: Never  Other Topics Concern   Not on file  Social History Narrative   Patient lives with his wife    Patient right handed   Patient drinks caffine on occ.   Social Determinants of Health   Financial Resource Strain: Not on file  Food Insecurity: Not on file  Transportation  Needs: Not on file  Physical Activity: Not on file  Stress: Not on file  Social Connections: Not on file  Intimate Partner Violence: Not on file    Family History  Problem Relation Age of Onset   Diabetes Mother    Hypertension Mother    Cancer Father        lung   Cancer Sister        breast   Sickle cell anemia Daughter    Cancer Maternal Grandfather  prostate   Colon cancer Neg Hx        Objective: Vitals:   03/16/22 0938  BP: 120/73  Pulse: 85     Physical Exam  Lab Results:  PSA PSA  Date Value Ref Range Status  03/16/2020 <0.1 < OR = 4.0 ng/mL Final    Comment:    The total PSA value from this assay system is  standardized against the WHO standard. The test  result will be approximately 20% lower when compared  to the equimolar-standardized total PSA (Beckman  Coulter). Comparison of serial PSA results should be  interpreted with this fact in mind. . This test was performed using the Siemens  chemiluminescent method. Values obtained from  different assay methods cannot be used interchangeably. PSA levels, regardless of value, should not be interpreted as absolute evidence of the presence or absence of disease.   01/03/2017 6.8 (H) <=4.0 ng/mL Final    Comment:      The total PSA value from this assay system is standardized against the WHO standard. The test result will be approximately 20% lower when compared to the equimolar-standardized total PSA (Beckman Coulter). Comparison of serial PSA results should be interpreted with this fact in mind.   This test was performed using the Siemens chemiluminescent method. Values obtained from different assay methods cannot be used interchangeably. PSA levels, regardless of value, should not be interpreted as absolute evidence of the presence or absence of disease.      Testosterone  Date Value Ref Range Status  03/09/2022 10 (L) 264 - 916 ng/dL Final    Comment:    Adult male reference  interval is based on a population of healthy nonobese males (BMI <30) between 41 and 10 years old. [place], [place] [phone]. PMID: [id number].   03/10/2021 19 (L) 264 - 916 ng/dL Final    Comment:    Adult male reference interval is based on a population of healthy nonobese males (BMI <30) between 34 and 68 years old. [place], [place] [phone]. PMID: [id number].   03/16/2020 12 (L) 250 - 827 ng/dL Final    Comment:    In hypogonadal males, Testosterone, Total, LC/MS/MS, is the recommended assay due to the diminished accuracy of immunoassay at levels below 250 ng/dL. This test code [phone]) must be collected in a red-top tube with no gel.     Lab Results  Component Value Date   PSA1 0.2 03/09/2022   PSA1 <0.1 09/01/2021   PSA1 <0.1 03/18/2021      Studies/Results: No results found.   Assessment & Plan: Prostate cancer.  His PSA is minimally increased from the prior level and with his high grade disease, recurrence is a definite possibility. The testosterone remains castrate.  I will get another PSA in 3 months and then in 6 months to better determine the PSADT.   Eligard today. Urge incontinence.  He continues to do well on Myrbetriq '25mg'$  and additional samples were given. Microhematuria.   He has 3-10 RBC's and with the recent finding of radiation proctitis with bleeding, I will get a CT and have him return for cystoscopy.    Meds ordered this encounter  Medications   DISCONTD: leuprolide (6 Month) (ELIGARD) injection 45 mg   leuprolide (6 Month) (ELIGARD) injection 45 mg     Orders Placed This Encounter  Procedures   Microscopic Examination   CT HEMATURIA WORKUP    Standing Status:   Future    Standing Expiration Date:  03/17/2023    Order Specific Question:   Reason for Exam (SYMPTOM  OR DIAGNOSIS REQUIRED)    Answer:   microhematuria    Order Specific Question:   Preferred imaging location?    Answer:   [facility]    Order  Specific Question:   Radiology Contrast Protocol - do NOT remove file path    Answer:   [email or web address]   Urinalysis, Routine w reflex microscopic   PSA    Standing Status:   Future    Standing Expiration Date:   09/16/2022   PSA    Standing Status:   Future    Standing Expiration Date:   03/17/2023   Testosterone    Standing Status:   Future    Standing Expiration Date:   03/17/2023    UA has 3-10 RBC's.   Return for Next available f/u for cystoscopy and then 6 months for next Eligard. , Next available.   CC: Celene Squibb, MD      Irine Seal 03/16/2022 Patient ID: [name], male   DOB: November 05, 1950, 71 y.o.   MRN: [id number]

## 2022-03-20 ENCOUNTER — Encounter (HOSPITAL_COMMUNITY): Payer: Self-pay | Admitting: Internal Medicine

## 2022-03-23 ENCOUNTER — Other Ambulatory Visit: Payer: Medicare Other | Admitting: Urology

## 2022-03-23 NOTE — Progress Notes (Unsigned)
Subjective:  No diagnosis found.   [name] is a 71yo here for followup for a history of T3 N1 M0 Gleason 9 prostate cancer with right external iliac nodal disease at diagnosis in 2018 and urge incontinence. He had radiation in 2018.  He has mild LUTS and mild urgency with mirabegron '25mg'$  every other day.  He has nocturia x 1.  He has had no hematuria but his UA today has 3-10 RBC's. PSA is 0.2  and his Testosterone is 10 in 5/22. He is due for eligard today.  UA is clear today.   He has no weight loss or bone pain and he has rare hot flashes.  He had endoscopy on Monday for rectal bleeding from radiation cystitis and had some fulguration.  His Cr is 1.7 with a GFR of 43.   He has not had any abdominal imaging since 2018.       ROS:  ROS:  A complete review of systems was performed.  All systems are negative except for pertinent findings as noted.   Review of Systems  All other systems reviewed and are negative.  Allergies  Allergen Reactions   Zolpidem Tartrate Other (See Comments)    disorientation     Outpatient Encounter Medications as of 03/23/2022  Medication Sig   acetaminophen (TYLENOL) 325 MG tablet Take 650 mg by mouth daily as needed for mild pain, moderate pain or headache.   atenolol (TENORMIN) 25 MG tablet TAKE 1 TABLET BY MOUTH EVERY DAY   atorvastatin (LIPITOR) 80 MG tablet TAKE 1 TABLET BY MOUTH EVERY DAY   B-D UF III MINI PEN NEEDLES 31G X 5 MM MISC USE FOUR TIMES DAILY AS DIRECTED   Calcium Carb-Cholecalciferol 600-500 MG-UNIT CAPS Take 2 capsules by mouth daily.    clopidogrel (PLAVIX) 75 MG tablet TAKE 1 TABLET BY MOUTH EVERY DAY   CONTOUR TEST test strip TEST TWICE DAILY E11.65   furosemide (LASIX) 20 MG tablet Take 20 mg by mouth 3 (three) times a week.   glucose blood test strip 1 each by Other route 2 (two) times daily. Use as instructed bid. E11.65 Contour next   insulin degludec (TRESIBA FLEXTOUCH) 100 UNIT/ML SOPN FlexTouch Pen Inject 30 Units into the  skin at bedtime.   latanoprost (XALATAN) 0.005 % ophthalmic solution Place 1 drop into both eyes at bedtime.   mirabegron ER (MYRBETRIQ) 25 MG TB24 tablet Take 1 tablet (25 mg total) by mouth every other day.   Semaglutide 0.25 or 0.5 MG/DOSE SOPN Inject 0.5 mg into the skin every Monday.    No facility-administered encounter medications on file as of 03/23/2022.    Past Medical History:  Diagnosis Date   Arthritis    Cerebrovascular disease    MRI shows Right carotid inferior cavernours narrowing 75% and  50-75% stenosis of Cavernous and supraclinoi right side   CKD (chronic kidney disease) 06/28/2014   Sees Dr Florene Glen   Diabetic retinopathy [facility])    Hyperlipidemia    Hypertension    Myocardial infarction [facility])    "mild" heart attack   Necrosis ([place])    #2 nail    Pneumonia    as a child   Poor circulation of extremity    Prostate cancer ([place]) 2017   Prostate   Sleep apnea    uses cpap, getting a new one   Type 2 Diabetes mellitus    Type 2    Past Surgical History:  Procedure Laterality Date   ABOVE KNEE  LEG AMPUTATION Left 2004   started out below knee and then extended to above knee due to poor healing   AMPUTATION Right 04/28/2015   Procedure: AMPUTATION RAY, RIGHT 5TH TOE;  Surgeon: Marybelle Killings, MD;  Location: [place];  Service: Orthopedics;  Laterality: Right;   AMPUTATION Right 05/10/2015   Procedure: Right Below Knee Amputation;  Surgeon: Marybelle Killings, MD;  Location: [place];  Service: Orthopedics;  Laterality: Right;   CATARACT EXTRACTION W/PHACO  09/05/2012   Procedure: CATARACT EXTRACTION PHACO AND INTRAOCULAR LENS PLACEMENT (IOC);  Surgeon: Tonny Branch, MD;  [facility];  Service: Ophthalmology;  Laterality: Left;  CDE=5.45   CATARACT EXTRACTION W/PHACO  10/03/2012   Procedure: CATARACT EXTRACTION PHACO AND INTRAOCULAR LENS PLACEMENT (IOC);  Surgeon: Tonny Branch, MD;  [facility];  Service: Ophthalmology;  Laterality: Right;  CDE: 12.31   COLONOSCOPY  2011    Dr. Deatra Ina: colon polyps, tubular adenoma   COLONOSCOPY N/A 11/01/2018   Dr. Gala Romney: Blood noted in the rectal vault.  Vascular pattern of the rectum abnormal, neovascular changes distally and actively oozing.  There were 3 2 to 5 mm polyps in the splenic flexure, cecum removed.  Cecal polyp was not recovered.  Radiation proctitis status post APC treatment.  The splenic flexure polyps were tubular adenomas.  Next colonoscopy in 5 years.   FLEXIBLE SIGMOIDOSCOPY N/A 10/27/2019   Procedure: FLEXIBLE SIGMOIDOSCOPY;  Surgeon: Danie Binder, MD; rectal bleeding due to radiation proctitis s/p APC therapy (actively bleeding during exam), rectosigmoid colon and sigmoid colon appeared normal.   FLEXIBLE SIGMOIDOSCOPY N/A 03/13/2022   Procedure: FLEXIBLE SIGMOIDOSCOPY;  Surgeon: Eloise Harman, DO;  [facility];  Service: Endoscopy;  Laterality: N/A;  1:45pm   HOT HEMOSTASIS  03/13/2022   Procedure: HOT HEMOSTASIS (ARGON PLASMA COAGULATION/BICAP);  Surgeon: Eloise Harman, DO;  [facility];  Service: Endoscopy;;   POLYPECTOMY  11/01/2018   Procedure: POLYPECTOMY;  Surgeon: Daneil Dolin, MD;  [facility];  Service: Endoscopy;;  cecum,splenic flexure   REFRACTIVE SURGERY Left 08/01/2021   Cleaned cataract    Social History   Socioeconomic History   Marital status: Married    Spouse name: Not on file   Number of children: 2   Years of education: college   Highest education level: Not on file  Occupational History   Occupation: Mining engineer: [employer]  Tobacco Use   Smoking status: Never   Smokeless tobacco: Never  Vaping Use   Vaping Use: Never used  Substance and Sexual Activity   Alcohol use: No   Drug use: No   Sexual activity: Never  Other Topics Concern   Not on file  Social History Narrative   Patient lives with his wife    Patient right handed   Patient drinks caffine on occ.   Social Determinants of Health    Financial Resource Strain: Not on file  Food Insecurity: Not on file  Transportation Needs: Not on file  Physical Activity: Not on file  Stress: Not on file  Social Connections: Not on file  Intimate Partner Violence: Not on file    Family History  Problem Relation Age of Onset   Diabetes Mother    Hypertension Mother    Cancer Father        lung   Cancer Sister        breast   Sickle cell anemia Daughter  Cancer Maternal Grandfather        prostate   Colon cancer Neg Hx        Objective: There were no vitals filed for this visit.    Physical Exam  Lab Results:  PSA PSA  Date Value Ref Range Status  03/16/2020 <0.1 < OR = 4.0 ng/mL Final    Comment:    The total PSA value from this assay system is  standardized against the WHO standard. The test  result will be approximately 20% lower when compared  to the equimolar-standardized total PSA (Beckman  Coulter). Comparison of serial PSA results should be  interpreted with this fact in mind. . This test was performed using the Siemens  chemiluminescent method. Values obtained from  different assay methods cannot be used interchangeably. PSA levels, regardless of value, should not be interpreted as absolute evidence of the presence or absence of disease.   01/03/2017 6.8 (H) <=4.0 ng/mL Final    Comment:      The total PSA value from this assay system is standardized against the WHO standard. The test result will be approximately 20% lower when compared to the equimolar-standardized total PSA (Beckman Coulter). Comparison of serial PSA results should be interpreted with this fact in mind.   This test was performed using the Siemens chemiluminescent method. Values obtained from different assay methods cannot be used interchangeably. PSA levels, regardless of value, should not be interpreted as absolute evidence of the presence or absence of disease.      Testosterone  Date Value Ref Range Status   03/09/2022 10 (L) 264 - 916 ng/dL Final    Comment:    Adult male reference interval is based on a population of healthy nonobese males (BMI <30) between 84 and 35 years old. [place], [place] [phone]. PMID: [id number].   03/10/2021 19 (L) 264 - 916 ng/dL Final    Comment:    Adult male reference interval is based on a population of healthy nonobese males (BMI <30) between 38 and 77 years old. [place], [place] [phone]. PMID: [id number].   03/16/2020 12 (L) 250 - 827 ng/dL Final    Comment:    In hypogonadal males, Testosterone, Total, LC/MS/MS, is the recommended assay due to the diminished accuracy of immunoassay at levels below 250 ng/dL. This test code [phone]) must be collected in a red-top tube with no gel.     Lab Results  Component Value Date   PSA1 0.2 03/09/2022   PSA1 <0.1 09/01/2021   PSA1 <0.1 03/18/2021      Studies/Results: No results found.   Assessment & Plan: Prostate cancer.  His PSA is minimally increased from the prior level and with his high grade disease, recurrence is a definite possibility. The testosterone remains castrate.  I will get another PSA in 3 months and then in 6 months to better determine the PSADT.   Eligard today. Urge incontinence.  He continues to do well on Myrbetriq '25mg'$  and additional samples were given. Microhematuria.   He has 3-10 RBC's and with the recent finding of radiation proctitis with bleeding, I will get a CT and have him return for cystoscopy.    No orders of the defined types were placed in this encounter.    No orders of the defined types were placed in this encounter.   UA has 3-10 RBC's.   No follow-ups on file.   CC: Celene Squibb, MD      Irine Seal 03/23/2022 Patient ID:  [name], male   DOB: April 03, 1951, 71 y.o.   MRN: [id number]

## 2022-03-25 ENCOUNTER — Ambulatory Visit (HOSPITAL_BASED_OUTPATIENT_CLINIC_OR_DEPARTMENT_OTHER)
Admission: RE | Admit: 2022-03-25 | Discharge: 2022-03-25 | Disposition: A | Discharge status: Home / Self Care | Payer: Medicare Other | Source: Ambulatory Visit | Attending: Urology | Admitting: Urology

## 2022-03-25 DIAGNOSIS — I700 Atherosclerosis of aorta: Secondary | ICD-10-CM | POA: Diagnosis not present

## 2022-03-25 DIAGNOSIS — R3129 Other microscopic hematuria: Secondary | ICD-10-CM | POA: Insufficient documentation

## 2022-03-25 DIAGNOSIS — K8020 Calculus of gallbladder without cholecystitis without obstruction: Secondary | ICD-10-CM | POA: Diagnosis not present

## 2022-03-25 DIAGNOSIS — N3289 Other specified disorders of bladder: Secondary | ICD-10-CM | POA: Diagnosis not present

## 2022-03-25 LAB — POCT I-STAT CREATININE: Creatinine, Ser: 1.8 mg/dL — ABNORMAL HIGH (ref 0.61–1.24)

## 2022-03-25 MED ORDER — IOHEXOL 300 MG/ML  SOLN
100.0000 mL | Freq: Once | INTRAMUSCULAR | Status: AC | PRN
  Administered 2022-03-25: 80 mL via INTRAVENOUS

## 2022-03-30 ENCOUNTER — Encounter: Payer: Self-pay | Admitting: Urology

## 2022-03-30 ENCOUNTER — Ambulatory Visit (INDEPENDENT_AMBULATORY_CARE_PROVIDER_SITE_OTHER): Payer: Medicare Other | Admitting: Urology

## 2022-03-30 VITALS — BP 94/59 | HR 82

## 2022-03-30 DIAGNOSIS — R351 Nocturia: Secondary | ICD-10-CM | POA: Diagnosis not present

## 2022-03-30 DIAGNOSIS — R3129 Other microscopic hematuria: Secondary | ICD-10-CM

## 2022-03-30 DIAGNOSIS — C61 Malignant neoplasm of prostate: Secondary | ICD-10-CM | POA: Diagnosis not present

## 2022-03-30 DIAGNOSIS — N3941 Urge incontinence: Secondary | ICD-10-CM

## 2022-03-30 DIAGNOSIS — N3040 Irradiation cystitis without hematuria: Secondary | ICD-10-CM | POA: Diagnosis not present

## 2022-03-30 DIAGNOSIS — R9721 Rising PSA following treatment for malignant neoplasm of prostate: Secondary | ICD-10-CM

## 2022-03-30 LAB — URINALYSIS, ROUTINE W REFLEX MICROSCOPIC
Bilirubin, UA: NEGATIVE
Glucose, UA: NEGATIVE
Ketones, UA: NEGATIVE
Leukocytes,UA: NEGATIVE
Nitrite, UA: NEGATIVE
Protein,UA: NEGATIVE
RBC, UA: NEGATIVE
Specific Gravity, UA: 1.015 (ref 1.005–1.030)
Urobilinogen, Ur: 0.2 mg/dL (ref 0.2–1.0)
pH, UA: 6 (ref 5.0–7.5)

## 2022-03-30 MED ORDER — CIPROFLOXACIN HCL 500 MG PO TABS
500.0000 mg | ORAL_TABLET | Freq: Once | ORAL | Status: AC
  Administered 2022-03-30: 500 mg via ORAL

## 2022-03-30 NOTE — Progress Notes (Unsigned)
Subjective:  1. Microhematuria   2. Prostate cancer ([place])   3. Rising PSA following treatment for malignant neoplasm of prostate   4. Urge incontinence   5. Nocturia   6. Radiation cystitis     03/30/22: [name] returns today in f/u for cystoscopy.  The CT hematuria study showed mild bladder wall thickening.    03/16/22: [name] is a 71yo here for followup for a history of T3 N1 M0 Gleason 9 prostate cancer with right external iliac nodal disease at diagnosis in 2018 and urge incontinence. He had radiation in 2018.  He has mild LUTS and mild urgency with mirabegron '25mg'$  every other day.  He has nocturia x 1.  He has had no hematuria but his UA today has 3-10 RBC's. PSA is 0.2  and his Testosterone is 10 in 5/22. He is due for eligard today.  UA is clear today.   He has no weight loss or bone pain and he has rare hot flashes.  He had endoscopy on Monday for rectal bleeding from radiation cystitis and had some fulguration.  His Cr is 1.7 with a GFR of 43.   He has not had any abdominal imaging since 2018.       ROS:  ROS:  A complete review of systems was performed.  All systems are negative except for pertinent findings as noted.   Review of Systems  All other systems reviewed and are negative.  Allergies  Allergen Reactions   Zolpidem Tartrate Other (See Comments)    disorientation     Outpatient Encounter Medications as of 03/30/2022  Medication Sig   acetaminophen (TYLENOL) 325 MG tablet Take 650 mg by mouth daily as needed for mild pain, moderate pain or headache.   atenolol (TENORMIN) 25 MG tablet TAKE 1 TABLET BY MOUTH EVERY DAY   atorvastatin (LIPITOR) 80 MG tablet TAKE 1 TABLET BY MOUTH EVERY DAY   B-D UF III MINI PEN NEEDLES 31G X 5 MM MISC USE FOUR TIMES DAILY AS DIRECTED   Calcium Carb-Cholecalciferol 600-500 MG-UNIT CAPS Take 2 capsules by mouth daily.    clopidogrel (PLAVIX) 75 MG tablet TAKE 1 TABLET BY MOUTH EVERY DAY   CONTOUR TEST test strip TEST TWICE DAILY E11.65    furosemide (LASIX) 20 MG tablet Take 20 mg by mouth 3 (three) times a week.   glucose blood test strip 1 each by Other route 2 (two) times daily. Use as instructed bid. E11.65 Contour next   insulin degludec (TRESIBA FLEXTOUCH) 100 UNIT/ML SOPN FlexTouch Pen Inject 30 Units into the skin at bedtime.   latanoprost (XALATAN) 0.005 % ophthalmic solution Place 1 drop into both eyes at bedtime.   mirabegron ER (MYRBETRIQ) 25 MG TB24 tablet Take 1 tablet (25 mg total) by mouth every other day.   Semaglutide 0.25 or 0.5 MG/DOSE SOPN Inject 0.5 mg into the skin every Monday.    [EXPIRED] ciprofloxacin (CIPRO) tablet 500 mg    No facility-administered encounter medications on file as of 03/30/2022.    Past Medical History:  Diagnosis Date   Arthritis    Cerebrovascular disease    MRI shows Right carotid inferior cavernours narrowing 75% and  50-75% stenosis of Cavernous and supraclinoi right side   CKD (chronic kidney disease) 06/28/2014   Sees Dr Florene Glen   Diabetic retinopathy [facility])    Hyperlipidemia    Hypertension    Myocardial infarction [facility])    "mild" heart attack   Necrosis ([place])    #2 nail  Pneumonia    as a child   Poor circulation of extremity    Prostate cancer ([place]) 2017   Prostate   Sleep apnea    uses cpap, getting a new one   Type 2 Diabetes mellitus    Type 2    Past Surgical History:  Procedure Laterality Date   ABOVE KNEE LEG AMPUTATION Left 2004   started out below knee and then extended to above knee due to poor healing   AMPUTATION Right 04/28/2015   Procedure: AMPUTATION RAY, RIGHT 5TH TOE;  Surgeon: Marybelle Killings, MD;  Location: [place];  Service: Orthopedics;  Laterality: Right;   AMPUTATION Right 05/10/2015   Procedure: Right Below Knee Amputation;  Surgeon: Marybelle Killings, MD;  Location: [place];  Service: Orthopedics;  Laterality: Right;   CATARACT EXTRACTION W/PHACO  09/05/2012   Procedure: CATARACT EXTRACTION PHACO AND INTRAOCULAR LENS PLACEMENT (IOC);   Surgeon: Tonny Branch, MD;  [facility];  Service: Ophthalmology;  Laterality: Left;  CDE=5.45   CATARACT EXTRACTION W/PHACO  10/03/2012   Procedure: CATARACT EXTRACTION PHACO AND INTRAOCULAR LENS PLACEMENT (IOC);  Surgeon: Tonny Branch, MD;  [facility];  Service: Ophthalmology;  Laterality: Right;  CDE: 12.31   COLONOSCOPY  2011   Dr. Deatra Ina: colon polyps, tubular adenoma   COLONOSCOPY N/A 11/01/2018   Dr. Gala Romney: Blood noted in the rectal vault.  Vascular pattern of the rectum abnormal, neovascular changes distally and actively oozing.  There were 3 2 to 5 mm polyps in the splenic flexure, cecum removed.  Cecal polyp was not recovered.  Radiation proctitis status post APC treatment.  The splenic flexure polyps were tubular adenomas.  Next colonoscopy in 5 years.   FLEXIBLE SIGMOIDOSCOPY N/A 10/27/2019   Procedure: FLEXIBLE SIGMOIDOSCOPY;  Surgeon: Danie Binder, MD; rectal bleeding due to radiation proctitis s/p APC therapy (actively bleeding during exam), rectosigmoid colon and sigmoid colon appeared normal.   FLEXIBLE SIGMOIDOSCOPY N/A 03/13/2022   Procedure: FLEXIBLE SIGMOIDOSCOPY;  Surgeon: Eloise Harman, DO;  [facility];  Service: Endoscopy;  Laterality: N/A;  1:45pm   HOT HEMOSTASIS  03/13/2022   Procedure: HOT HEMOSTASIS (ARGON PLASMA COAGULATION/BICAP);  Surgeon: Eloise Harman, DO;  [facility];  Service: Endoscopy;;   POLYPECTOMY  11/01/2018   Procedure: POLYPECTOMY;  Surgeon: Daneil Dolin, MD;  [facility];  Service: Endoscopy;;  cecum,splenic flexure   REFRACTIVE SURGERY Left 08/01/2021   Cleaned cataract    Social History   Socioeconomic History   Marital status: Married    Spouse name: Not on file   Number of children: 2   Years of education: college   Highest education level: Not on file  Occupational History   Occupation: Mining engineer: [employer]  Tobacco Use   Smoking status: Never   Smokeless  tobacco: Never  Vaping Use   Vaping Use: Never used  Substance and Sexual Activity   Alcohol use: No   Drug use: No   Sexual activity: Never  Other Topics Concern   Not on file  Social History Narrative   Patient lives with his wife    Patient right handed   Patient drinks caffine on occ.   Social Determinants of Health   Financial Resource Strain: Not on file  Food Insecurity: Not on file  Transportation Needs: Not on file  Physical Activity: Not on file  Stress: Not on file  Social Connections: Not on file  Intimate Partner Violence: Not on file    Family History  Problem Relation Age of Onset   Diabetes Mother    Hypertension Mother    Cancer Father        lung   Cancer Sister        breast   Sickle cell anemia Daughter    Cancer Maternal Grandfather        prostate   Colon cancer Neg Hx        Objective: Vitals:   03/30/22 1458  BP: (!) 94/59  Pulse: 82      Physical Exam  Lab Results:  PSA PSA  Date Value Ref Range Status  03/16/2020 <0.1 < OR = 4.0 ng/mL Final    Comment:    The total PSA value from this assay system is  standardized against the WHO standard. The test  result will be approximately 20% lower when compared  to the equimolar-standardized total PSA (Beckman  Coulter). Comparison of serial PSA results should be  interpreted with this fact in mind. . This test was performed using the Siemens  chemiluminescent method. Values obtained from  different assay methods cannot be used interchangeably. PSA levels, regardless of value, should not be interpreted as absolute evidence of the presence or absence of disease.   01/03/2017 6.8 (H) <=4.0 ng/mL Final    Comment:      The total PSA value from this assay system is standardized against the WHO standard. The test result will be approximately 20% lower when compared to the equimolar-standardized total PSA (Beckman Coulter). Comparison of serial PSA results should be interpreted  with this fact in mind.   This test was performed using the Siemens chemiluminescent method. Values obtained from different assay methods cannot be used interchangeably. PSA levels, regardless of value, should not be interpreted as absolute evidence of the presence or absence of disease.      Testosterone  Date Value Ref Range Status  03/09/2022 10 (L) 264 - 916 ng/dL Final    Comment:    Adult male reference interval is based on a population of healthy nonobese males (BMI <30) between 59 and 77 years old. [place], [place] [phone]. PMID: [id number].   03/10/2021 19 (L) 264 - 916 ng/dL Final    Comment:    Adult male reference interval is based on a population of healthy nonobese males (BMI <30) between 43 and 6 years old. [place], [place] [phone]. PMID: [id number].   03/16/2020 12 (L) 250 - 827 ng/dL Final    Comment:    In hypogonadal males, Testosterone, Total, LC/MS/MS, is the recommended assay due to the diminished accuracy of immunoassay at levels below 250 ng/dL. This test code [phone]) must be collected in a red-top tube with no gel.     Lab Results  Component Value Date   PSA1 0.2 03/09/2022   PSA1 <0.1 09/01/2021   PSA1 <0.1 03/18/2021      Studies/Results: No results found.  CT HEMATURIA WORKUP  Result Date: 03/28/2022 CLINICAL DATA:  Microscopic hematuria. EXAM: CT ABDOMEN AND PELVIS WITHOUT AND WITH CONTRAST TECHNIQUE: Multidetector CT imaging of the abdomen and pelvis was performed following the standard protocol before and following the bolus administration of intravenous contrast. RADIATION DOSE REDUCTION: This exam was performed according to the departmental dose-optimization program which includes automated exposure control, adjustment of the mA and/or kV according to patient size and/or use of iterative reconstruction technique. CONTRAST:  44m OMNIPAQUE IOHEXOL 300 MG/ML  SOLN COMPARISON:  None Available. FINDINGS: Lower  Chest: No acute findings. Chronic interstitial lung disease noted in both bases. Hepatobiliary: No hepatic masses identified. Tiny cyst noted in the left hepatic lobe. A sub-cm calcified gallstone is seen, however there is no evidence of cholecystitis or biliary ductal dilatation. Pancreas:  No mass or inflammatory changes. Spleen: Within normal limits in size and appearance. Adrenals/Urinary Tract: No adrenal masses identified. No evidence of urolithiasis or hydronephrosis. No complex cystic or solid renal masses identified. No masses seen involving the collecting systems, ureters, or bladder. Mild diffuse bladder wall thickening is seen which may be due to chronic bladder outlet obstruction or post radiation changes given history of prostate carcinoma. Stomach/Bowel: No evidence of obstruction, inflammatory process or abnormal fluid collections. Normal appendix visualized. Vascular/Lymphatic: No pathologically enlarged lymph nodes. No acute vascular findings. Aortic atherosclerotic calcification incidentally noted. Reproductive:  Very small prostate versus prior prostatectomy. Other:  None. Musculoskeletal:  No suspicious bone lesions identified. IMPRESSION: No radiographic evidence of urinary tract neoplasm, urolithiasis, or hydronephrosis. Mild diffuse bladder wall thickening, which may be due to chronic bladder outlet obstruction or post radiation changes given history of prostate carcinoma. Cholelithiasis. No radiographic evidence of cholecystitis. Aortic Atherosclerosis (ICD10-I70.0). Electronically Signed   By: Marlaine Hind M.D.   On: 03/28/2022 08:24    Procedure: cystoscopy  He was prepped with betadine and 2% lidocaine jelly and given Cipro '500mg'$ .  The urethra is normal except for some radiation changes of the membranous urethra, external sphincter and prostate.  He has mild lateral lobe hyperplasia without significant obstruction.  There is mild trabeculation with some neovascularity at the bladder  neck and posterior bladder wall.  No tumors are noted.   The UO's are normal.     Assessment & Plan: Prostate cancer.  His PSA was minimally increased from the prior level at his visit in May and with his high grade disease, recurrence is a definite possibility. The testosterone remains castrate.  He is scheduled for another PSA in 3 months and then in 6 months to better determine the PSADT.   Eligard in 6 months. Urge incontinence.  He continues to do well on Myrbetriq '25mg'$  and additional samples were given. Microhematuria.   UA is clear, CT and cysto show only radiation cystitis.   Meds ordered this encounter  Medications   ciprofloxacin (CIPRO) tablet 500 mg     Orders Placed This Encounter  Procedures   Urinalysis, Routine w reflex microscopic    UA has 3-10 RBC's.   Return for He should have a PSA already in for 3 months and then again with an OV in 6 months for Lupron. .   CC: Celene Squibb, MD      Irine Seal 03/31/2022 Patient ID: [name], male   DOB: [birth year], 71 y.o.   MRN: [id number]

## 2022-04-25 DIAGNOSIS — H40051 Ocular hypertension, right eye: Secondary | ICD-10-CM | POA: Diagnosis not present

## 2022-04-27 ENCOUNTER — Telehealth: Payer: Self-pay | Admitting: Nurse Practitioner

## 2022-04-27 NOTE — Telephone Encounter (Signed)
Pt's family member picked up his medication

## 2022-04-27 NOTE — Telephone Encounter (Signed)
Informed patient his patient assistance, tresiba, ozempic and pen needles are ready for pick up.

## 2022-05-03 DIAGNOSIS — E1165 Type 2 diabetes mellitus with hyperglycemia: Secondary | ICD-10-CM | POA: Diagnosis not present

## 2022-06-13 ENCOUNTER — Encounter: Payer: Self-pay | Admitting: *Deleted

## 2022-06-22 DIAGNOSIS — Z79818 Long term (current) use of other agents affecting estrogen receptors and estrogen levels: Secondary | ICD-10-CM | POA: Diagnosis not present

## 2022-06-22 DIAGNOSIS — C61 Malignant neoplasm of prostate: Secondary | ICD-10-CM | POA: Diagnosis not present

## 2022-07-05 ENCOUNTER — Other Ambulatory Visit: Payer: Medicare Other

## 2022-07-05 DIAGNOSIS — C61 Malignant neoplasm of prostate: Secondary | ICD-10-CM

## 2022-07-06 ENCOUNTER — Other Ambulatory Visit: Payer: Self-pay | Admitting: Urology

## 2022-07-06 DIAGNOSIS — C61 Malignant neoplasm of prostate: Secondary | ICD-10-CM

## 2022-07-06 DIAGNOSIS — R9721 Rising PSA following treatment for malignant neoplasm of prostate: Secondary | ICD-10-CM

## 2022-07-06 LAB — PSA: Prostate Specific Ag, Serum: 0.6 ng/mL (ref 0.0–4.0)

## 2022-07-06 LAB — TESTOSTERONE: Testosterone: 5 ng/dL — ABNORMAL LOW (ref 264–916)

## 2022-07-07 ENCOUNTER — Telehealth: Payer: Self-pay

## 2022-07-07 NOTE — Telephone Encounter (Signed)
-----   Message from Irine Seal, MD sent at 07/06/2022  3:24 PM EDT ----- His PSA is rising but is still fairly low but I have placed an order for a PSMA PET to be done in late October/early November prior to this next f/u visit.     ----- Message ----- From: Sherrilyn Rist, CMA Sent: 07/06/2022   3:09 PM EDT To: Irine Seal, MD  Please Review

## 2022-07-07 NOTE — Telephone Encounter (Signed)
Tried calling patient with no answer. 

## 2022-07-07 NOTE — Telephone Encounter (Signed)
Made patient aware that his PSA is rising but still fairly low. Made patient aware that an order for PSMA PET  to be done in late October/November and someone will contact the patient with that time and date. Patient voice understanding.

## 2022-08-10 ENCOUNTER — Telehealth: Payer: Self-pay | Admitting: "Endocrinology

## 2022-08-10 DIAGNOSIS — H40053 Ocular hypertension, bilateral: Secondary | ICD-10-CM | POA: Diagnosis not present

## 2022-08-10 LAB — HM DIABETES EYE EXAM

## 2022-08-10 NOTE — Telephone Encounter (Signed)
Pt's daughter Konrad Felix picked up

## 2022-08-10 NOTE — Telephone Encounter (Signed)
Patient made awre he has Ozempic, Pen needles & Tyler Aas ready for pick up.

## 2022-08-15 ENCOUNTER — Ambulatory Visit (INDEPENDENT_AMBULATORY_CARE_PROVIDER_SITE_OTHER): Payer: Medicare Other | Admitting: Nurse Practitioner

## 2022-08-15 ENCOUNTER — Encounter: Payer: Self-pay | Admitting: Nurse Practitioner

## 2022-08-15 VITALS — BP 127/68 | HR 90 | Ht 71.5 in | Wt 254.8 lb

## 2022-08-15 DIAGNOSIS — I10 Essential (primary) hypertension: Secondary | ICD-10-CM

## 2022-08-15 DIAGNOSIS — E559 Vitamin D deficiency, unspecified: Secondary | ICD-10-CM

## 2022-08-15 DIAGNOSIS — E782 Mixed hyperlipidemia: Secondary | ICD-10-CM

## 2022-08-15 DIAGNOSIS — E1159 Type 2 diabetes mellitus with other circulatory complications: Secondary | ICD-10-CM

## 2022-08-15 LAB — POCT GLYCOSYLATED HEMOGLOBIN (HGB A1C): Hemoglobin A1C: 6.5 % — AB (ref 4.0–5.6)

## 2022-08-15 MED ORDER — SEMAGLUTIDE (1 MG/DOSE) 4 MG/3ML ~~LOC~~ SOPN: 1.0000 mg | PEN_INJECTOR | SUBCUTANEOUS | 3 refills | Status: AC

## 2022-08-15 NOTE — Progress Notes (Signed)
08/15/2022   Endocrinology follow-up note    Subjective:    Patient ID: [name], male    DOB: 1951-03-19, PCP Celene Squibb, MD   Past Medical History:  Diagnosis Date   Arthritis    Cerebrovascular disease    MRI shows Right carotid inferior cavernours narrowing 75% and  50-75% stenosis of Cavernous and supraclinoi right side   CKD (chronic kidney disease) 06/28/2014   Sees Dr Florene Glen   Diabetic retinopathy [facility])    Hyperlipidemia    Hypertension    Myocardial infarction [facility])    "mild" heart attack   Necrosis ([place])    #2 nail    Pneumonia    as a child   Poor circulation of extremity    Prostate cancer ([place]) 2017   Prostate   Sleep apnea    uses cpap, getting a new one   Type 2 Diabetes mellitus    Type 2   Past Surgical History:  Procedure Laterality Date   ABOVE KNEE LEG AMPUTATION Left 2004   started out below knee and then extended to above knee due to poor healing   AMPUTATION Right 04/28/2015   Procedure: AMPUTATION RAY, RIGHT 5TH TOE;  Surgeon: Marybelle Killings, MD;  Location: [place];  Service: Orthopedics;  Laterality: Right;   AMPUTATION Right 05/10/2015   Procedure: Right Below Knee Amputation;  Surgeon: Marybelle Killings, MD;  Location: [place];  Service: Orthopedics;  Laterality: Right;   CATARACT EXTRACTION W/PHACO  09/05/2012   Procedure: CATARACT EXTRACTION PHACO AND INTRAOCULAR LENS PLACEMENT (IOC);  Surgeon: Tonny Branch, MD;  Location: [facility];  Service: Ophthalmology;  Laterality: Left;  CDE=5.45   CATARACT EXTRACTION W/PHACO  10/03/2012   Procedure: CATARACT EXTRACTION PHACO AND INTRAOCULAR LENS PLACEMENT (IOC);  Surgeon: Tonny Branch, MD;  Location: [facility];  Service: Ophthalmology;  Laterality: Right;  CDE: 12.31   COLONOSCOPY  2011   Dr. Deatra Ina: colon polyps, tubular adenoma   COLONOSCOPY N/A 11/01/2018   Dr. Gala Romney: Blood noted in the rectal vault.  Vascular pattern of the rectum abnormal, neovascular changes distally and actively oozing.  There were 3 2 to  5 mm polyps in the splenic flexure, cecum removed.  Cecal polyp was not recovered.  Radiation proctitis status post APC treatment.  The splenic flexure polyps were tubular adenomas.  Next colonoscopy in 5 years.   FLEXIBLE SIGMOIDOSCOPY N/A 10/27/2019   Procedure: FLEXIBLE SIGMOIDOSCOPY;  Surgeon: Danie Binder, MD; rectal bleeding due to radiation proctitis s/p APC therapy (actively bleeding during exam), rectosigmoid colon and sigmoid colon appeared normal.   FLEXIBLE SIGMOIDOSCOPY N/A 03/13/2022   Procedure: FLEXIBLE SIGMOIDOSCOPY;  Surgeon: Eloise Harman, DO;  [facility];  Service: Endoscopy;  Laterality: N/A;  1:45pm   HOT HEMOSTASIS  03/13/2022   Procedure: HOT HEMOSTASIS (ARGON PLASMA COAGULATION/BICAP);  Surgeon: Eloise Harman, DO;  [facility];  Service: Endoscopy;;   POLYPECTOMY  11/01/2018   Procedure: POLYPECTOMY;  Surgeon: Daneil Dolin, MD;  [facility];  Service: Endoscopy;;  cecum,splenic flexure   REFRACTIVE SURGERY Left 08/01/2021   Cleaned cataract   Social History   Socioeconomic History   Marital status: Married    Spouse name: Not on file   Number of children: 2   Years of education: college   Highest education level: Not on file  Occupational History   Occupation: Theme park manager    Employer: [employer]  Tobacco Use   Smoking  status: Never   Smokeless tobacco: Never  Vaping Use   Vaping Use: Never used  Substance and Sexual Activity   Alcohol use: No   Drug use: No   Sexual activity: Never  Other Topics Concern   Not on file  Social History Narrative   Patient lives with his wife    Patient right handed   Patient drinks caffine on occ.   Social Determinants of Health   Financial Resource Strain: Not on file  Food Insecurity: Not on file  Transportation Needs: Not on file  Physical Activity: Not on file  Stress: Not on file  Social Connections: Not on file   Outpatient Encounter Medications as  of 08/15/2022  Medication Sig   acetaminophen (TYLENOL) 325 MG tablet Take 650 mg by mouth daily as needed for mild pain, moderate pain or headache.   atenolol (TENORMIN) 25 MG tablet TAKE 1 TABLET BY MOUTH EVERY DAY   atorvastatin (LIPITOR) 80 MG tablet TAKE 1 TABLET BY MOUTH EVERY DAY   B-D UF III MINI PEN NEEDLES 31G X 5 MM MISC USE FOUR TIMES DAILY AS DIRECTED   Calcium Carb-Cholecalciferol 600-500 MG-UNIT CAPS Take 2 capsules by mouth daily.    clopidogrel (PLAVIX) 75 MG tablet TAKE 1 TABLET BY MOUTH EVERY DAY   CONTOUR TEST test strip TEST TWICE DAILY E11.65   furosemide (LASIX) 20 MG tablet Take 20 mg by mouth 3 (three) times a week.   glucose blood test strip 1 each by Other route 2 (two) times daily. Use as instructed bid. E11.65 Contour next   insulin degludec (TRESIBA FLEXTOUCH) 100 UNIT/ML SOPN FlexTouch Pen Inject 30 Units into the skin at bedtime.   latanoprost (XALATAN) 0.005 % ophthalmic solution Place 1 drop into both eyes at bedtime.   mirabegron ER (MYRBETRIQ) 25 MG TB24 tablet Take 1 tablet (25 mg total) by mouth every other day.   Semaglutide, 1 MG/DOSE, 4 MG/3ML SOPN Inject 1 mg as directed once a week.   [DISCONTINUED] Semaglutide 0.25 or 0.5 MG/DOSE SOPN Inject 0.5 mg into the skin every Monday.    No facility-administered encounter medications on file as of 08/15/2022.   ALLERGIES: Allergies  Allergen Reactions   Zolpidem Tartrate Other (See Comments)    disorientation    VACCINATION STATUS: Immunization History  Administered Date(s) Administered   Fluad Quad(high Dose 65+) 07/01/2019   Influenza Split 06/30/2013, 08/30/2013, 08/23/2020   Influenza-Unspecified 07/30/2015, 06/30/2018   Moderna Sars-Covid-2 Vaccination 11/20/2019, 12/17/2019   Pneumococcal Conjugate-13 01/08/2018   Pneumococcal Polysaccharide-23 07/31/2007, 08/26/2019   Td 10/30/2002   Tdap 06/19/2016   Zoster, Live 06/19/2016    Diabetes He presents for his follow-up diabetic visit. He  has type 2 diabetes mellitus. Onset time: He was diagnosed at approximate age of 7 years. His disease course has been stable. There are no hypoglycemic associated symptoms. Pertinent negatives for hypoglycemia include no confusion, pallor or seizures. There are no diabetic associated symptoms. Pertinent negatives for diabetes include no fatigue, no polydipsia, no polyphagia, no polyuria and no weakness. There are no hypoglycemic complications. Symptoms are stable. Diabetic complications include heart disease, nephropathy and PVD. (Status post bilateral lower extremity amputations with prosthetics.) Risk factors for coronary artery disease include diabetes mellitus, dyslipidemia, hypertension, male sex, obesity, sedentary lifestyle and tobacco exposure. Current diabetic treatment includes insulin injections (and Ozempic). He is compliant with treatment most of the time. His weight is fluctuating minimally. He is following a diabetic diet. When asked about meal planning, he reported  none. He has had a previous visit with a dietitian. He participates in exercise daily (5 x per week). (He presents today with his meter, no logs, showing stable, at target fasting glycemic profile.  His POCT A1c today is 6.5%, unchanged from previous visit.  He denies any hypoglycemia.  He has tolerated the 0.5 mg Ozempic well.) An ACE inhibitor/angiotensin II receptor blocker is not being taken. He does not see a podiatrist.Eye exam is current.  Hyperlipidemia This is a chronic problem. The current episode started more than 1 year ago. The problem is controlled. Recent lipid tests were reviewed and are normal. Exacerbating diseases include chronic renal disease, diabetes and obesity. Factors aggravating his hyperlipidemia include beta blockers. Pertinent negatives include no myalgias. Current antihyperlipidemic treatment includes statins. The current treatment provides moderate improvement of lipids. There are no compliance problems.   Risk factors for coronary artery disease include dyslipidemia, diabetes mellitus, hypertension, male sex, obesity and a sedentary lifestyle.  Hypertension This is a chronic problem. The current episode started more than 1 year ago. The problem is unchanged. The problem is controlled. There are no associated agents to hypertension. Risk factors for coronary artery disease include diabetes mellitus, dyslipidemia, family history, male gender and sedentary lifestyle. Past treatments include beta blockers and diuretics. The current treatment provides moderate improvement. There are no compliance problems.  Hypertensive end-organ damage includes kidney disease and PVD. Identifiable causes of hypertension include chronic renal disease.    Review of systems  Constitutional: + Minimally fluctuating body weight,  current Body mass index is 35.04 kg/m. , no fatigue, no subjective hyperthermia, no subjective hypothermia Eyes: no blurry vision, no xerophthalmia ENT: no sore throat, no nodules palpated in throat, no dysphagia/odynophagia, no hoarseness Cardiovascular: no chest pain, no shortness of breath, no palpitations Respiratory: no cough, no shortness of breath Gastrointestinal: no nausea/vomiting/diarrhea Musculoskeletal: no muscle/joint aches, walks with walker d/t BLE amputations with prosthesis Skin: no rashes, no hyperemia Neurological: no tremors, no numbness, no tingling, no dizziness Psychiatric: no depression, no anxiety    Objective:    BP 127/68 (BP Location: Right Arm, Patient Position: Sitting, Cuff Size: Large)   Pulse 90   Ht 5' 11.5" (1.816 m)   Wt 254 lb 12.8 oz (115.6 kg)   BMI 35.04 kg/m   Wt Readings from Last 3 Encounters:  08/15/22 254 lb 12.8 oz (115.6 kg)  03/16/22 251 lb (113.9 kg)  03/09/22 253 lb (114.8 kg)    BP Readings from Last 3 Encounters:  08/15/22 127/68  03/30/22 (!) 94/59  03/16/22 120/73    Physical Exam- Limited  Constitutional:  Body mass  index is 35.04 kg/m., not in acute distress, normal state of mind Eyes:  EOMI, no exophthalmos Neck: Supple Cardiovascular: RRR, no murmurs, rubs, or gallops, no edema Respiratory: Adequate breathing efforts, no crackles, rales, rhonchi, or wheezing Musculoskeletal: walks with walker d/t BLE amputations with prosthesis Skin:  no rashes, no hyperemia Neurological: no tremor with outstretched hands    Results for orders placed or performed in visit on 08/15/22  HgB A1c  Result Value Ref Range   Hemoglobin A1C 6.5 (A) 4.0 - 5.6 %   HbA1c POC (<> result, manual entry)     HbA1c, POC (prediabetic range)     HbA1c, POC (controlled diabetic range)     Complete Blood Count (Most recent): Lab Results  Component Value Date   WBC 5.9 03/09/2022   HGB 11.0 (L) 03/09/2022   HCT 33.1 (L) 03/09/2022  MCV 90.7 03/09/2022   PLT 234 03/09/2022   Chemistry (most recent): Lab Results  Component Value Date   NA 141 03/09/2022   K 4.1 03/09/2022   CL 112 (H) 03/09/2022   CO2 20 (L) 03/09/2022   BUN 31 (H) 03/09/2022   CREATININE 1.80 (H) 03/25/2022   Diabetic Labs (most recent): Lab Results  Component Value Date   HGBA1C 6.5 (A) 08/15/2022   HGBA1C 6.5 02/13/2022   HGBA1C 7.5 09/01/2021   MICROALBUR 30 08/06/2020   MICROALBUR 0.2 10/31/2016   Lipid Panel     Component Value Date/Time   CHOL 128 09/01/2021 0000   CHOL 113 01/31/2021 0937   TRIG 74 09/01/2021 0000   TRIG 98 10/27/2013 1305   HDL 46 09/01/2021 0000   HDL 47 01/31/2021 0937   HDL 39 (L) 10/27/2013 1305   CHOLHDL 2.4 01/31/2021 0937   CHOLHDL 3.0 02/24/2020 0915   VLDL 14 10/31/2016 1137   LDLCALC 67 09/01/2021 0000   LDLCALC 51 01/31/2021 0937   LDLCALC 50 02/24/2020 0915   LDLCALC 82 10/27/2013 1305    Assessment & Plan:   1) Type 2 diabetes mellitus with other circulatory complications (HCC)  -His diabetes is complicated by extensive peripheral arterial disease with bilateral lower extremity  amputations and CKD and he remains at a high risk for more acute and chronic complications of diabetes which include CAD, CVA, CKD, retinopathy, and neuropathy. These are all discussed in detail with the patient.  He presents today with his meter, no logs, showing stable, at target fasting glycemic profile.  His POCT A1c today is 6.5%, unchanged from previous visit.  He denies any hypoglycemia.  He has tolerated the 0.5 mg Ozempic well.   -Recent labs reviewed.  - Nutritional counseling repeated at each appointment due to patients tendency to fall back in to old habits.  - The patient admits there is a room for improvement in their diet and drink choices. -  Suggestion is made for the patient to avoid simple carbohydrates from their diet including Cakes, Sweet Desserts / Pastries, Ice Cream, Soda (diet and regular), Sweet Tea, Candies, Chips, Cookies, Sweet Pastries, Store Bought Juices, Alcohol in Excess of 1-2 drinks a day, Artificial Sweeteners, Coffee Creamer, and "Sugar-free" Products. This will help patient to have stable blood glucose profile and potentially avoid unintended weight gain.   - I encouraged the patient to switch to unprocessed or minimally processed complex starch and increased protein intake (animal or plant source), fruits, and vegetables.   - Patient is advised to stick to a routine mealtimes to eat 3 meals a day and avoid unnecessary snacks (to snack only to correct hypoglycemia).  - I have approached patient with the following individualized plan to manage diabetes and patient agrees.  -Given his stable glycemic profile, will continue current medication regimen.   -He is advised to continue Tresiba 30 units SQ nightly (sample provided) and increase Ozempic to 1 mg SQ weekly which may give [place] the opportunity to decrease insulin on subsequent visits.    -He is encouraged to continue monitoring blood glucose twice daily, before breakfast and before bed, and call the clinic  if he gets readings less than 70 or greater than 200 for 3 tests in a row.  - His insurance did not provide coverage for Ozempic nor Trulicity but he does have help with Patient assistance program.  2) Hypertension: His blood pressure is controlled to target.  He is advised to continue Atenolol  25 mg po daily, and Lasix 20 mg po every other day.  3) Lipids/HPL:  His most recent lipid panel from 01/31/21 shows controlled LDL at 51.  He is advised to continue Lipitor 80 mg po daily at bedtime.  Side effects and precautions discussed with him.   - I advised patient to maintain close follow up with Celene Squibb, MD for primary care needs.      I spent 48 minutes in the care of the patient today including review of labs from [place], Lipids, Thyroid Function, Hematology (current and previous including abstractions from other facilities); face-to-face time discussing  his blood glucose readings/logs, discussing hypoglycemia and hyperglycemia episodes and symptoms, medications doses, his options of short and long term treatment based on the latest standards of care / guidelines;  discussion about incorporating lifestyle medicine;  and documenting the encounter. Risk reduction counseling performed per USPSTF guidelines to reduce obesity and cardiovascular risk factors.     Please refer to Patient Instructions for Blood Glucose Monitoring and Insulin/Medications Dosing Guide"  in media tab for additional information. Please  also refer to " Patient Self Inventory" in the Media  tab for reviewed elements of pertinent patient history.  Ozella Almond Moline participated in the discussions, expressed understanding, and voiced agreement with the above plans.  All questions were answered to his satisfaction. he is encouraged to contact clinic should he have any questions or concerns prior to his return visit.    Follow up plan: -Return in about 6 months (around 02/14/2023) for Diabetes F/U with A1c in office, No previsit  labs, Bring meter and logs.  Rayetta Pigg, [facility] [address] [place], [place] [address] Phone: [phone] Fax: [phone]  08/15/2022, 12:21 PM

## 2022-08-20 IMAGING — CT CT ABD-PEL WO/W CM
3 of 12 series · 12 of 46 positions shown, 18 images · IV contrast (agent unspecified)
Comparison: None Available.

CLINICAL DATA: Microscopic hematuria.

EXAM:
CT ABDOMEN AND PELVIS WITHOUT AND WITH CONTRAST
TECHNIQUE: Multidetector CT imaging of the abdomen and pelvis was performed
following the standard protocol before and following the bolus
administration of intravenous contrast.

[Series 2: pre supine · axial · non-contrast · 0.90mm/px · z∈[-235,+165]mm · 7 of 108 slices shown, 12 images]
[im 14/108  soft-tissue]
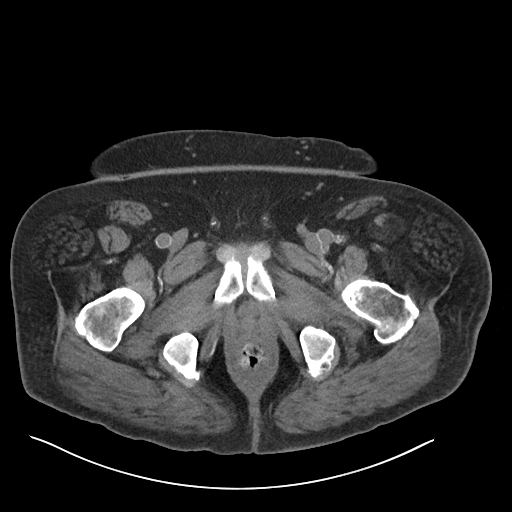
[im 14/108  bone]
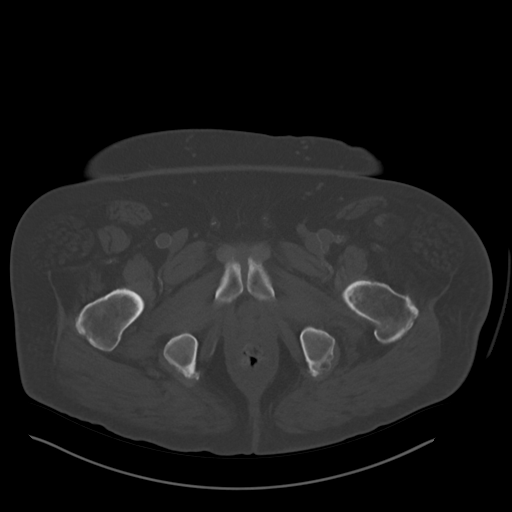
[im 27/108  soft-tissue]
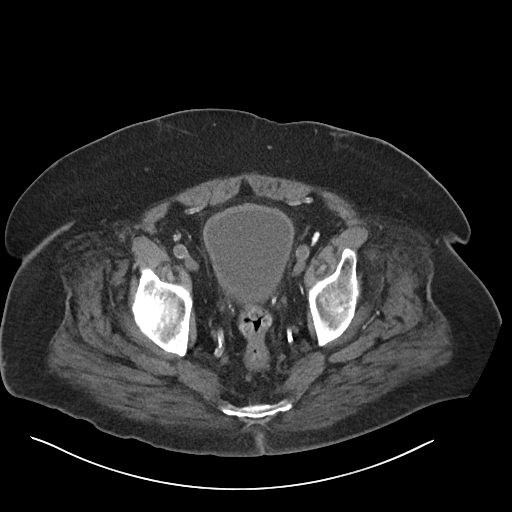
[im 41/108  soft-tissue]
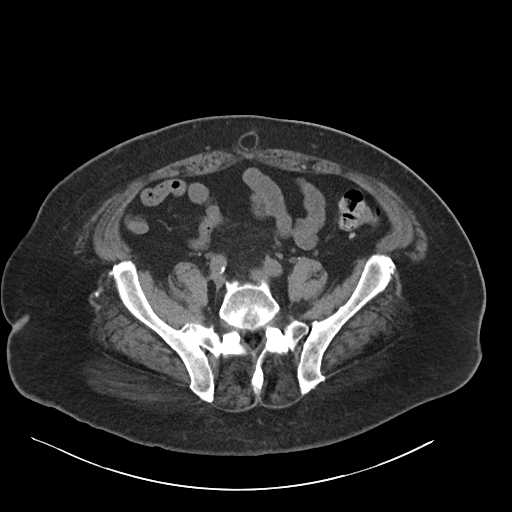
[im 54/108  soft-tissue]
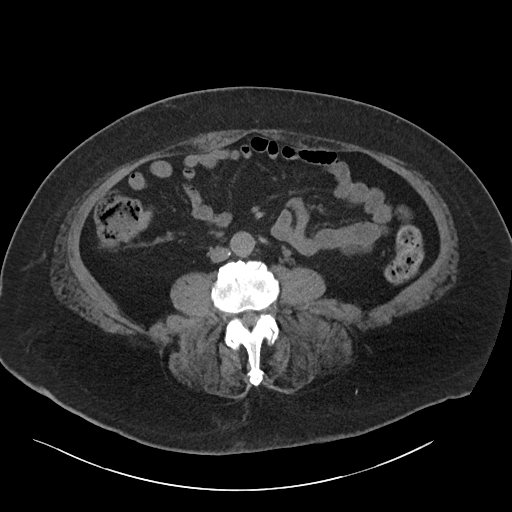
[im 54/108  lung]
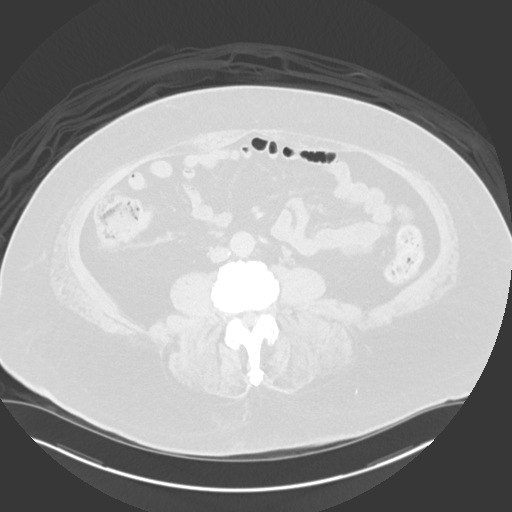
[im 67/108  soft-tissue]
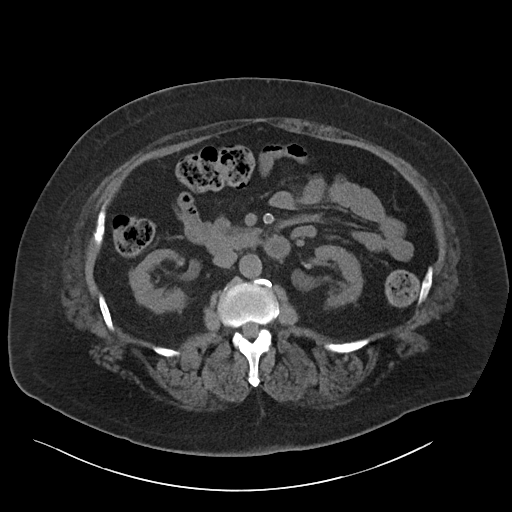
[im 67/108  lung]
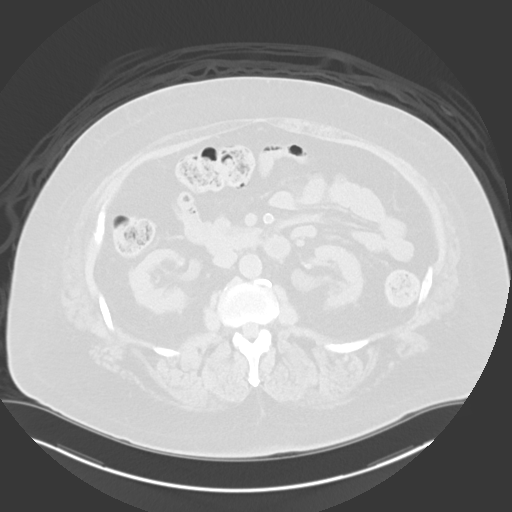
[im 81/108  soft-tissue]
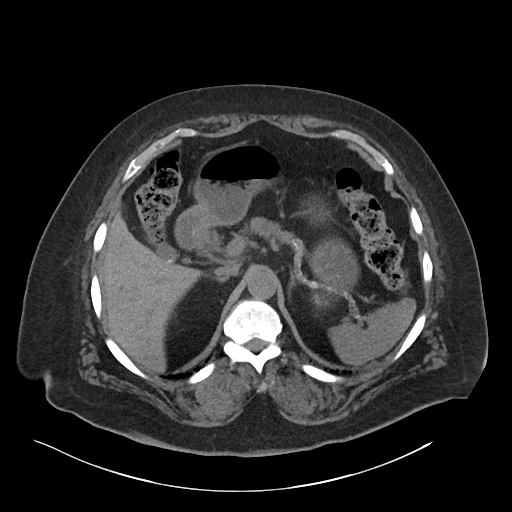
[im 81/108  lung]
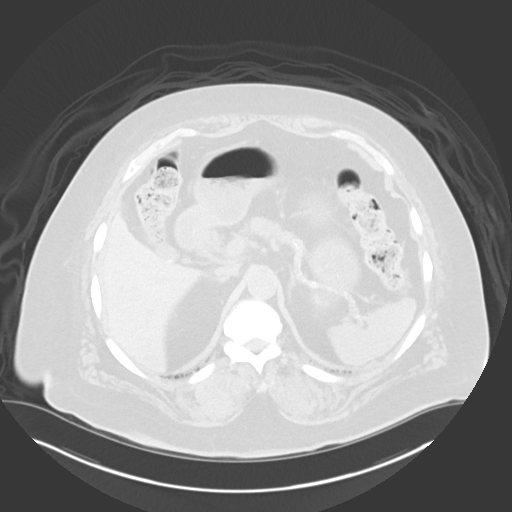
[im 94/108  soft-tissue]
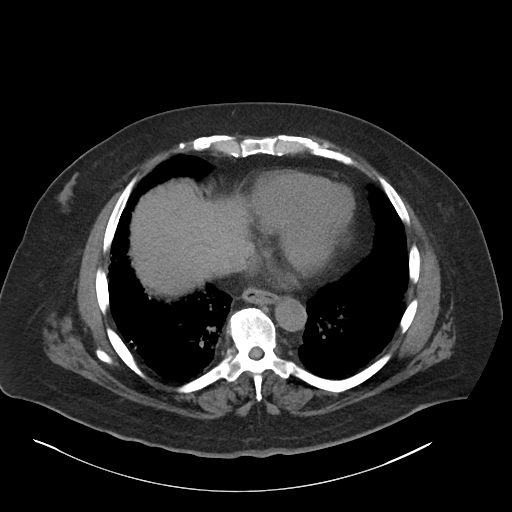
[im 94/108  lung]
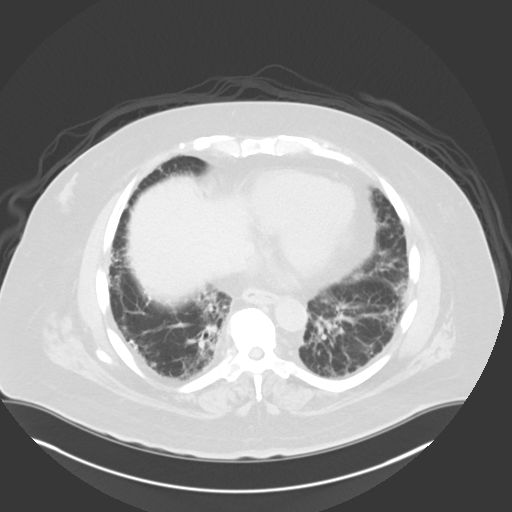

[Series 5: coronal pre · coronal · non-contrast · 0.89mm/px · 2 of 111 slices shown, 3 images]
[im 37/111  soft-tissue]
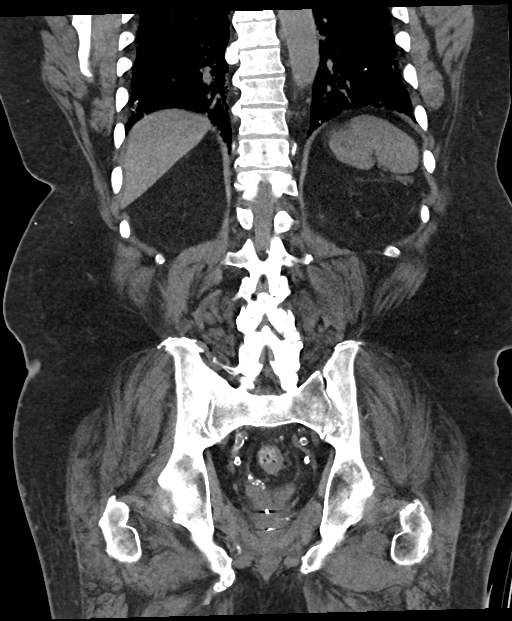
[im 37/111  bone]
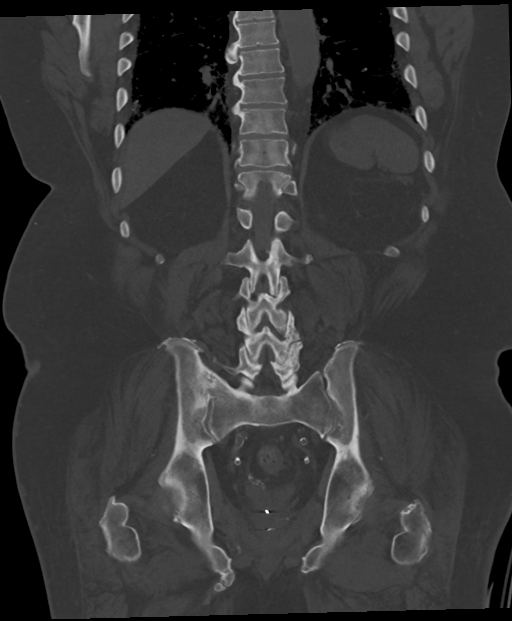
[im 74/111  soft-tissue]
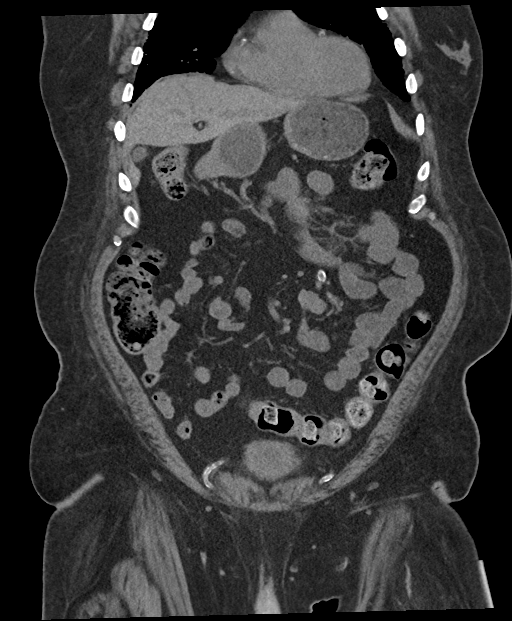

[Series 7: post supine · axial · 0.94mm/px · z∈[-225,-75]mm · 3 of 108 slices shown]
[im 16/108  soft-tissue]
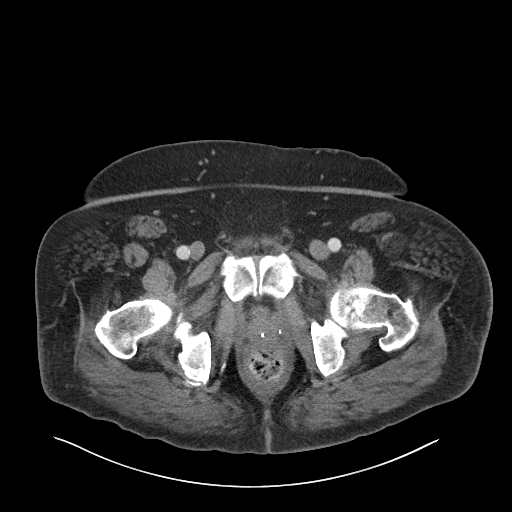
[im 31/108  soft-tissue]
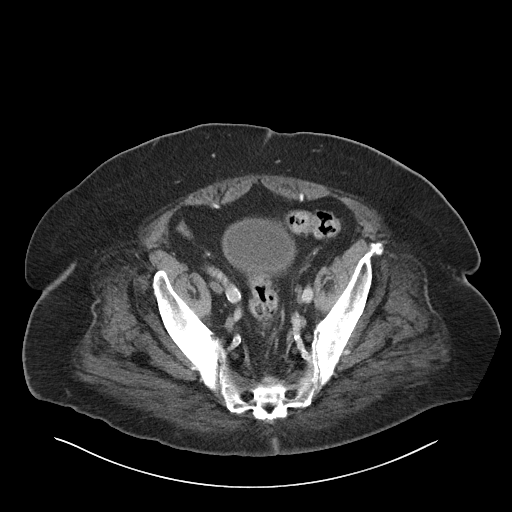
[im 46/108  soft-tissue]
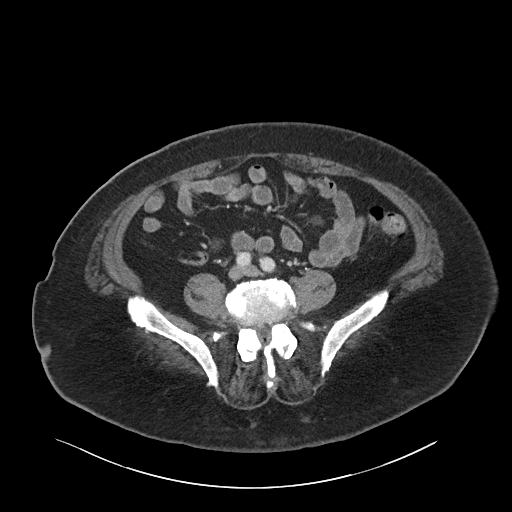

[12 of 46 positions shown; findings below may reference images not displayed]

RADIATION DOSE REDUCTION: This exam was performed according to the
departmental dose-optimization program which includes automated
exposure control, adjustment of the mA and/or kV according to
patient size and/or use of iterative reconstruction technique.

CONTRAST:  80mL OMNIPAQUE IOHEXOL 300 MG/ML  SOLN
FINDINGS: Lower Chest: No acute findings. Chronic interstitial lung disease
noted in both bases.

Hepatobiliary: No hepatic masses identified. Tiny cyst noted in the
left hepatic lobe. A sub-cm calcified gallstone is seen, however
there is no evidence of cholecystitis or biliary ductal dilatation.

Pancreas:  No mass or inflammatory changes.

Spleen: Within normal limits in size and appearance.

Adrenals/Urinary Tract: No adrenal masses identified. No evidence of
urolithiasis or hydronephrosis. No complex cystic or solid renal
masses identified. No masses seen involving the collecting systems,
ureters, or bladder. Mild diffuse bladder wall thickening is seen
which may be due to chronic bladder outlet obstruction or post
radiation changes given history of prostate carcinoma.

Stomach/Bowel: No evidence of obstruction, inflammatory process or
abnormal fluid collections. Normal appendix visualized.

Vascular/Lymphatic: No pathologically enlarged lymph nodes. No acute
vascular findings. Aortic atherosclerotic calcification incidentally
noted.

Reproductive:  Very small prostate versus prior prostatectomy.

Other:  None.

Musculoskeletal:  No suspicious bone lesions identified.
IMPRESSION: No radiographic evidence of urinary tract neoplasm, urolithiasis, or
hydronephrosis.

Mild diffuse bladder wall thickening, which may be due to chronic
bladder outlet obstruction or post radiation changes given history
of prostate carcinoma.

Cholelithiasis. No radiographic evidence of cholecystitis.

Aortic Atherosclerosis ([id number]-WW7.7).

## 2022-08-22 DIAGNOSIS — Z23 Encounter for immunization: Secondary | ICD-10-CM | POA: Diagnosis not present

## 2022-08-23 ENCOUNTER — Encounter: Payer: Self-pay | Admitting: Internal Medicine

## 2022-08-23 ENCOUNTER — Ambulatory Visit (INDEPENDENT_AMBULATORY_CARE_PROVIDER_SITE_OTHER): Payer: Medicare Other | Admitting: Internal Medicine

## 2022-08-23 VITALS — BP 110/74 | HR 108 | Temp 98.6°F | Ht 71.5 in | Wt 253.5 lb

## 2022-08-23 DIAGNOSIS — Z86010 Personal history of colonic polyps: Secondary | ICD-10-CM

## 2022-08-23 DIAGNOSIS — K5900 Constipation, unspecified: Secondary | ICD-10-CM

## 2022-08-23 DIAGNOSIS — K625 Hemorrhage of anus and rectum: Secondary | ICD-10-CM | POA: Diagnosis not present

## 2022-08-23 NOTE — Progress Notes (Signed)
Referring Provider: Celene Squibb, MD Primary Care Physician:  Celene Squibb, MD Primary GI:  Dr. Abbey Chatters  Chief Complaint  Patient presents with   Rectal Bleeding    Blood in stool, constipation, has BM about every other day.     HPI:   [name] is a 71 y.o. male who presents to clinic today for follow-up visit.  Patient has a history of radiation proctitis, rectal bleeding, and adenomatous colon polyps. He had flex sig with APC treatment of radiation proctitis on 10/27/19 (actively bleeding during exam). He had APC at time of colonoscopy in 10/2018 as well. Next TCS due in 2025. Also with history of anemia likely secondary to chronic disease. Hemoglobin 11 on 03/09/2022, consistent with baseline.   Underwent flexible sigmoidoscopy 03/13/2022 due to worsening rectal bleeding.  Numerous rectal AVMs actively oozing, 1 sigmoid AVM, all treated with APC.  Today, states he is doing much better.  Has not had profuse rectal bleeding like prior since before his latest flex sig.  Does note occasional episodes of small amount of rectal bleeding related to large stool balls.  States he has bowel movements every 1 to 2 days.  Past Medical History:  Diagnosis Date   Arthritis    Cerebrovascular disease    MRI shows Right carotid inferior cavernours narrowing 75% and  50-75% stenosis of Cavernous and supraclinoi right side   CKD (chronic kidney disease) 06/28/2014   Sees Dr Florene Glen   Diabetic retinopathy [facility])    Hyperlipidemia    Hypertension    Myocardial infarction [facility])    "mild" heart attack   Necrosis ([place])    #2 nail    Pneumonia    as a child   Poor circulation of extremity    Prostate cancer ([place]) 2017   Prostate   Sleep apnea    uses cpap, getting a new one   Type 2 Diabetes mellitus    Type 2    Past Surgical History:  Procedure Laterality Date   ABOVE KNEE LEG AMPUTATION Left 2004   started out below knee and then extended to above knee due to poor healing   AMPUTATION  Right 04/28/2015   Procedure: AMPUTATION RAY, RIGHT 5TH TOE;  Surgeon: Marybelle Killings, MD;  Location: [place];  Service: Orthopedics;  Laterality: Right;   AMPUTATION Right 05/10/2015   Procedure: Right Below Knee Amputation;  Surgeon: Marybelle Killings, MD;  Location: [place];  Service: Orthopedics;  Laterality: Right;   CATARACT EXTRACTION W/PHACO  09/05/2012   Procedure: CATARACT EXTRACTION PHACO AND INTRAOCULAR LENS PLACEMENT (IOC);  Surgeon: Tonny Branch, MD;  [facility];  Service: Ophthalmology;  Laterality: Left;  CDE=5.45   CATARACT EXTRACTION W/PHACO  10/03/2012   Procedure: CATARACT EXTRACTION PHACO AND INTRAOCULAR LENS PLACEMENT (IOC);  Surgeon: Tonny Branch, MD;  [facility];  Service: Ophthalmology;  Laterality: Right;  CDE: 12.31   COLONOSCOPY  2011   Dr. Deatra Ina: colon polyps, tubular adenoma   COLONOSCOPY N/A 11/01/2018   Dr. Gala Romney: Blood noted in the rectal vault.  Vascular pattern of the rectum abnormal, neovascular changes distally and actively oozing.  There were 3 2 to 5 mm polyps in the splenic flexure, cecum removed.  Cecal polyp was not recovered.  Radiation proctitis status post APC treatment.  The splenic flexure polyps were tubular adenomas.  Next colonoscopy in 5 years.   FLEXIBLE SIGMOIDOSCOPY N/A 10/27/2019   Procedure: FLEXIBLE SIGMOIDOSCOPY;  Surgeon: Danie Binder, MD;  rectal bleeding due to radiation proctitis s/p APC therapy (actively bleeding during exam), rectosigmoid colon and sigmoid colon appeared normal.   FLEXIBLE SIGMOIDOSCOPY N/A 03/13/2022   Procedure: FLEXIBLE SIGMOIDOSCOPY;  Surgeon: Eloise Harman, DO;  [facility];  Service: Endoscopy;  Laterality: N/A;  1:45pm   HOT HEMOSTASIS  03/13/2022   Procedure: HOT HEMOSTASIS (ARGON PLASMA COAGULATION/BICAP);  Surgeon: Eloise Harman, DO;  [facility];  Service: Endoscopy;;   POLYPECTOMY  11/01/2018   Procedure: POLYPECTOMY;  Surgeon: Daneil Dolin, MD;  [facility];   Service: Endoscopy;;  cecum,splenic flexure   REFRACTIVE SURGERY Left 08/01/2021   Cleaned cataract    Current Outpatient Medications  Medication Sig Dispense Refill   acetaminophen (TYLENOL) 325 MG tablet Take 650 mg by mouth daily as needed for mild pain, moderate pain or headache.     atenolol (TENORMIN) 25 MG tablet TAKE 1 TABLET BY MOUTH EVERY DAY 90 tablet 1   atorvastatin (LIPITOR) 80 MG tablet TAKE 1 TABLET BY MOUTH EVERY DAY 90 tablet 1   B-D UF III MINI PEN NEEDLES 31G X 5 MM MISC USE FOUR TIMES DAILY AS DIRECTED 150 each 2   Calcium Carb-Cholecalciferol 600-500 MG-UNIT CAPS Take 2 capsules by mouth daily.      clopidogrel (PLAVIX) 75 MG tablet TAKE 1 TABLET BY MOUTH EVERY DAY 90 tablet 1   CONTOUR TEST test strip TEST TWICE DAILY E11.65 200 each 2   furosemide (LASIX) 20 MG tablet Take 20 mg by mouth 3 (three) times a week.     glucose blood test strip 1 each by Other route 2 (two) times daily. Use as instructed bid. E11.65 Contour next 300 each 2   insulin degludec (TRESIBA FLEXTOUCH) 100 UNIT/ML SOPN FlexTouch Pen Inject 30 Units into the skin at bedtime.     latanoprost (XALATAN) 0.005 % ophthalmic solution Place 1 drop into both eyes at bedtime.     mirabegron ER (MYRBETRIQ) 25 MG TB24 tablet Take 1 tablet (25 mg total) by mouth every other day. 56 tablet 0   Semaglutide, 1 MG/DOSE, 4 MG/3ML SOPN Inject 1 mg as directed once a week. 6 mL 3   No current facility-administered medications for this visit.    Allergies as of 08/23/2022 - Review Complete 08/23/2022  Allergen Reaction Noted   Zolpidem tartrate Other (See Comments) 04/11/2011    Family History  Problem Relation Age of Onset   Diabetes Mother    Hypertension Mother    Cancer Father        lung   Cancer Sister        breast   Sickle cell anemia Daughter    Cancer Maternal Grandfather        prostate   Colon cancer Neg Hx     Social History   Socioeconomic History   Marital status: Married    Spouse  name: Not on file   Number of children: 2   Years of education: college   Highest education level: Not on file  Occupational History   Occupation: Mining engineer: [employer]  Tobacco Use   Smoking status: Never    Passive exposure: Never   Smokeless tobacco: Never  Vaping Use   Vaping Use: Never used  Substance and Sexual Activity   Alcohol use: No   Drug use: No   Sexual activity: Never  Other Topics Concern   Not on file  Social History Narrative  Patient lives with his wife    Patient right handed   Patient drinks caffine on occ.   Social Determinants of Health   Financial Resource Strain: Not on file  Food Insecurity: Not on file  Transportation Needs: Not on file  Physical Activity: Not on file  Stress: Not on file  Social Connections: Not on file    Subjective: Review of Systems  Constitutional:  Negative for chills and fever.  HENT:  Negative for congestion and hearing loss.   Eyes:  Negative for blurred vision and double vision.  Respiratory:  Negative for cough and shortness of breath.   Cardiovascular:  Negative for chest pain and palpitations.  Gastrointestinal:  Positive for blood in stool. Negative for abdominal pain, constipation, diarrhea, heartburn, melena and vomiting.  Genitourinary:  Negative for dysuria and urgency.  Musculoskeletal:  Negative for joint pain and myalgias.  Skin:  Negative for itching and rash.  Neurological:  Negative for dizziness and headaches.  Psychiatric/Behavioral:  Negative for depression. The patient is not nervous/anxious.      Objective: BP 110/74 (BP Location: Left Arm, Patient Position: Sitting, Cuff Size: Large)   Pulse (!) 108   Temp 98.6 F (37 C) (Oral)   Ht 5' 11.5" (1.816 m)   Wt 253 lb 8 oz (115 kg)   BMI 34.86 kg/m  Physical Exam Constitutional:      Appearance: Normal appearance.  HENT:     Head: Normocephalic and atraumatic.  Eyes:     Extraocular Movements: Extraocular  movements intact.     Conjunctiva/sclera: Conjunctivae normal.  Cardiovascular:     Rate and Rhythm: Normal rate and regular rhythm.  Pulmonary:     Effort: Pulmonary effort is normal.     Breath sounds: Normal breath sounds.  Abdominal:     General: Bowel sounds are normal.     Palpations: Abdomen is soft.  Musculoskeletal:        General: Normal range of motion.     Cervical back: Normal range of motion and neck supple.  Skin:    General: Skin is warm.  Neurological:     General: No focal deficit present.     Mental Status: He is alert and oriented to person, place, and time.  Psychiatric:        Mood and Affect: Mood normal.        Behavior: Behavior normal.      Assessment: *Rectal bleeding-much improved *Rectal AVMs *Adenomatous colon polyps *Constipation  Plan: Patient's rectal bleeding vastly improved.  Does note occasional small amount of rectal bleeding related to hard stools.  Counseled to start taking MiraLAX 1-2 capfuls daily and see how he does.  Colonoscopy recall 2025 due to adenomatous colon polyps in the past.  Follow-up with GI in 4 months.  08/23/2022 3:39 PM   Disclaimer: This note was dictated with voice recognition software. Similar sounding words can inadvertently be transcribed and may not be corrected upon review.

## 2022-08-23 NOTE — Patient Instructions (Signed)
To soften your bowel movements, would recommend taking over-the-counter MiraLAX (polyethylene glycol) 1 capful every morning.  If this is not adequate you can increase to 2 capfuls daily.  If this is too much you can take 1 capful every 2 to 3 days.  We will plan on repeat colonoscopy 2025 given your history of polyps.  Follow-up with GI in 4 months.  It was very nice seeing you again today.  Dr. Abbey Chatters

## 2022-08-24 DIAGNOSIS — N3281 Overactive bladder: Secondary | ICD-10-CM | POA: Diagnosis not present

## 2022-08-24 DIAGNOSIS — C61 Malignant neoplasm of prostate: Secondary | ICD-10-CM | POA: Diagnosis not present

## 2022-08-24 DIAGNOSIS — I2510 Atherosclerotic heart disease of native coronary artery without angina pectoris: Secondary | ICD-10-CM | POA: Diagnosis not present

## 2022-08-24 DIAGNOSIS — K625 Hemorrhage of anus and rectum: Secondary | ICD-10-CM | POA: Diagnosis not present

## 2022-08-24 DIAGNOSIS — E782 Mixed hyperlipidemia: Secondary | ICD-10-CM | POA: Diagnosis not present

## 2022-08-24 DIAGNOSIS — Z89511 Acquired absence of right leg below knee: Secondary | ICD-10-CM | POA: Diagnosis not present

## 2022-08-24 DIAGNOSIS — I10 Essential (primary) hypertension: Secondary | ICD-10-CM | POA: Diagnosis not present

## 2022-08-24 DIAGNOSIS — E1122 Type 2 diabetes mellitus with diabetic chronic kidney disease: Secondary | ICD-10-CM | POA: Diagnosis not present

## 2022-08-24 DIAGNOSIS — Z89619 Acquired absence of unspecified leg above knee: Secondary | ICD-10-CM | POA: Diagnosis not present

## 2022-08-24 DIAGNOSIS — H409 Unspecified glaucoma: Secondary | ICD-10-CM | POA: Diagnosis not present

## 2022-08-24 DIAGNOSIS — N1831 Chronic kidney disease, stage 3a: Secondary | ICD-10-CM | POA: Diagnosis not present

## 2022-08-24 DIAGNOSIS — E113291 Type 2 diabetes mellitus with mild nonproliferative diabetic retinopathy without macular edema, right eye: Secondary | ICD-10-CM | POA: Diagnosis not present

## 2022-09-19 ENCOUNTER — Other Ambulatory Visit: Payer: Medicare Other

## 2022-09-19 ENCOUNTER — Other Ambulatory Visit: Payer: Self-pay

## 2022-09-19 DIAGNOSIS — C61 Malignant neoplasm of prostate: Secondary | ICD-10-CM

## 2022-09-20 LAB — PSA: Prostate Specific Ag, Serum: 0.9 ng/mL (ref 0.0–4.0)

## 2022-09-27 DIAGNOSIS — M542 Cervicalgia: Secondary | ICD-10-CM | POA: Diagnosis not present

## 2022-09-27 DIAGNOSIS — Z79899 Other long term (current) drug therapy: Secondary | ICD-10-CM | POA: Diagnosis not present

## 2022-09-27 DIAGNOSIS — Z7984 Long term (current) use of oral hypoglycemic drugs: Secondary | ICD-10-CM | POA: Diagnosis not present

## 2022-09-27 DIAGNOSIS — S161XXA Strain of muscle, fascia and tendon at neck level, initial encounter: Secondary | ICD-10-CM | POA: Diagnosis not present

## 2022-09-27 DIAGNOSIS — R0689 Other abnormalities of breathing: Secondary | ICD-10-CM | POA: Diagnosis not present

## 2022-09-27 DIAGNOSIS — K219 Gastro-esophageal reflux disease without esophagitis: Secondary | ICD-10-CM | POA: Diagnosis not present

## 2022-09-27 DIAGNOSIS — I959 Hypotension, unspecified: Secondary | ICD-10-CM | POA: Diagnosis not present

## 2022-09-27 DIAGNOSIS — E1122 Type 2 diabetes mellitus with diabetic chronic kidney disease: Secondary | ICD-10-CM | POA: Diagnosis not present

## 2022-09-27 DIAGNOSIS — I129 Hypertensive chronic kidney disease with stage 1 through stage 4 chronic kidney disease, or unspecified chronic kidney disease: Secondary | ICD-10-CM | POA: Diagnosis not present

## 2022-09-27 DIAGNOSIS — Z041 Encounter for examination and observation following transport accident: Secondary | ICD-10-CM | POA: Diagnosis not present

## 2022-09-27 DIAGNOSIS — R918 Other nonspecific abnormal finding of lung field: Secondary | ICD-10-CM | POA: Diagnosis not present

## 2022-09-27 DIAGNOSIS — E785 Hyperlipidemia, unspecified: Secondary | ICD-10-CM | POA: Diagnosis not present

## 2022-09-27 DIAGNOSIS — M50322 Other cervical disc degeneration at C5-C6 level: Secondary | ICD-10-CM | POA: Diagnosis not present

## 2022-09-27 DIAGNOSIS — M4312 Spondylolisthesis, cervical region: Secondary | ICD-10-CM | POA: Diagnosis not present

## 2022-09-27 DIAGNOSIS — N182 Chronic kidney disease, stage 2 (mild): Secondary | ICD-10-CM | POA: Diagnosis not present

## 2022-09-28 ENCOUNTER — Encounter: Payer: Self-pay | Admitting: Urology

## 2022-09-28 ENCOUNTER — Ambulatory Visit (INDEPENDENT_AMBULATORY_CARE_PROVIDER_SITE_OTHER): Payer: Medicare Other | Admitting: Urology

## 2022-09-28 VITALS — BP 115/70 | HR 81 | Ht 71.5 in | Wt 253.5 lb

## 2022-09-28 DIAGNOSIS — C61 Malignant neoplasm of prostate: Secondary | ICD-10-CM | POA: Diagnosis not present

## 2022-09-28 DIAGNOSIS — N3040 Irradiation cystitis without hematuria: Secondary | ICD-10-CM

## 2022-09-28 DIAGNOSIS — R3129 Other microscopic hematuria: Secondary | ICD-10-CM | POA: Diagnosis not present

## 2022-09-28 DIAGNOSIS — N3941 Urge incontinence: Secondary | ICD-10-CM

## 2022-09-28 DIAGNOSIS — R9721 Rising PSA following treatment for malignant neoplasm of prostate: Secondary | ICD-10-CM | POA: Diagnosis not present

## 2022-09-28 MED ORDER — LEUPROLIDE ACETATE (6 MONTH) 45 MG ~~LOC~~ KIT
45.0000 mg | PACK | Freq: Once | SUBCUTANEOUS | Status: AC
  Administered 2022-09-28: 45 mg via SUBCUTANEOUS

## 2022-09-28 NOTE — Progress Notes (Signed)
Subjective:  1. Prostate cancer ([place])   2. Rising PSA following treatment for malignant neoplasm of prostate   3. Radiation cystitis   4. Urge incontinence   5. Microhematuria     09/28/22: Elston returns today in f/u for his history of prostate cancer.  His PSA has been rising over the past year from <0.1 on 09/01/21 to 0.2 on 03/09/22, 0.6 on 07/05/22 and 0.9 on 09/19/22.  His last testosterone level was 5 on 07/05/22.  He has been on Eligard with the last dose on 03/16/22.  He had no evidence of mets on his CT in May.  He remains on Myrbetriq '25mg'$  qd for OAB.  He has CKD3b. He has had no weight loss.  He has had no bone pain.  He is voiding ok with no incontinence but some persistent urgency.  He had hematuria 4-5 months ago but none since.   03/30/22: Gaven returns today in f/u for cystoscopy.  The CT hematuria study showed mild bladder wall thickening.    03/16/22: Mr Lefkowitz is a 71yo here for followup for a history of T3 N1 M0 Gleason 9 prostate cancer with right external iliac nodal disease at diagnosis in 2018 and urge incontinence. He had radiation in 2018.  He has mild LUTS and mild urgency with mirabegron '25mg'$  every other day.  He has nocturia x 1.  He has had no hematuria but his UA today has 3-10 RBC's. PSA is 0.2  and his Testosterone is 10 in 5/22. He is due for eligard today.  UA is clear today.   He has no weight loss or bone pain and he has rare hot flashes.  He had endoscopy on Monday for rectal bleeding from radiation cystitis and had some fulguration.  His Cr is 1.7 with a GFR of 43.   He has not had any abdominal imaging since 2018.       ROS:  ROS:  A complete review of systems was performed.  All systems are negative except for pertinent findings as noted.   Review of Systems  All other systems reviewed and are negative.   Allergies  Allergen Reactions   Zolpidem Tartrate Other (See Comments)    disorientation     Outpatient Encounter Medications as of 09/28/2022   Medication Sig   acetaminophen (TYLENOL) 325 MG tablet Take 650 mg by mouth daily as needed for mild pain, moderate pain or headache.   atenolol (TENORMIN) 25 MG tablet TAKE 1 TABLET BY MOUTH EVERY DAY   atorvastatin (LIPITOR) 80 MG tablet TAKE 1 TABLET BY MOUTH EVERY DAY   B-D UF III MINI PEN NEEDLES 31G X 5 MM MISC USE FOUR TIMES DAILY AS DIRECTED   Calcium Carb-Cholecalciferol 600-500 MG-UNIT CAPS Take 2 capsules by mouth daily.    clopidogrel (PLAVIX) 75 MG tablet TAKE 1 TABLET BY MOUTH EVERY DAY   CONTOUR TEST test strip TEST TWICE DAILY E11.65   furosemide (LASIX) 20 MG tablet Take 20 mg by mouth 3 (three) times a week.   glucose blood test strip 1 each by Other route 2 (two) times daily. Use as instructed bid. E11.65 Contour next   insulin degludec (TRESIBA FLEXTOUCH) 100 UNIT/ML SOPN FlexTouch Pen Inject 30 Units into the skin at bedtime.   latanoprost (XALATAN) 0.005 % ophthalmic solution Place 1 drop into both eyes at bedtime.   mirabegron ER (MYRBETRIQ) 25 MG TB24 tablet Take 1 tablet (25 mg total) by mouth every other day.   Semaglutide, 1  MG/DOSE, 4 MG/3ML SOPN Inject 1 mg as directed once a week.   Facility-Administered Encounter Medications as of 09/28/2022  Medication   leuprolide (6 Month) (ELIGARD) injection 45 mg    Past Medical History:  Diagnosis Date   Arthritis    Cerebrovascular disease    MRI shows Right carotid inferior cavernours narrowing 75% and  50-75% stenosis of Cavernous and supraclinoi right side   CKD (chronic kidney disease) 06/28/2014   Sees Dr Florene Glen   Diabetic retinopathy [facility])    Hyperlipidemia    Hypertension    Myocardial infarction [facility])    "mild" heart attack   Necrosis ([place])    #2 nail    Pneumonia    as a child   Poor circulation of extremity    Prostate cancer ([place]) 2017   Prostate   Sleep apnea    uses cpap, getting a new one   Type 2 Diabetes mellitus    Type 2    Past Surgical History:  Procedure Laterality Date   ABOVE  KNEE LEG AMPUTATION Left 2004   started out below knee and then extended to above knee due to poor healing   AMPUTATION Right 04/28/2015   Procedure: AMPUTATION RAY, RIGHT 5TH TOE;  Surgeon: Marybelle Killings, MD;  Location: [place];  Service: Orthopedics;  Laterality: Right;   AMPUTATION Right 05/10/2015   Procedure: Right Below Knee Amputation;  Surgeon: Marybelle Killings, MD;  Location: [place];  Service: Orthopedics;  Laterality: Right;   CATARACT EXTRACTION W/PHACO  09/05/2012   Procedure: CATARACT EXTRACTION PHACO AND INTRAOCULAR LENS PLACEMENT (IOC);  Surgeon: Tonny Branch, MD;  [facility];  Service: Ophthalmology;  Laterality: Left;  CDE=5.45   CATARACT EXTRACTION W/PHACO  10/03/2012   Procedure: CATARACT EXTRACTION PHACO AND INTRAOCULAR LENS PLACEMENT (IOC);  Surgeon: Tonny Branch, MD;  [facility];  Service: Ophthalmology;  Laterality: Right;  CDE: 12.31   COLONOSCOPY  2011   Dr. Deatra Ina: colon polyps, tubular adenoma   COLONOSCOPY N/A 11/01/2018   Dr. Gala Romney: Blood noted in the rectal vault.  Vascular pattern of the rectum abnormal, neovascular changes distally and actively oozing.  There were 3 2 to 5 mm polyps in the splenic flexure, cecum removed.  Cecal polyp was not recovered.  Radiation proctitis status post APC treatment.  The splenic flexure polyps were tubular adenomas.  Next colonoscopy in 5 years.   FLEXIBLE SIGMOIDOSCOPY N/A 10/27/2019   Procedure: FLEXIBLE SIGMOIDOSCOPY;  Surgeon: Danie Binder, MD; rectal bleeding due to radiation proctitis s/p APC therapy (actively bleeding during exam), rectosigmoid colon and sigmoid colon appeared normal.   FLEXIBLE SIGMOIDOSCOPY N/A 03/13/2022   Procedure: FLEXIBLE SIGMOIDOSCOPY;  Surgeon: Eloise Harman, DO;  [facility];  Service: Endoscopy;  Laterality: N/A;  1:45pm   HOT HEMOSTASIS  03/13/2022   Procedure: HOT HEMOSTASIS (ARGON PLASMA COAGULATION/BICAP);  Surgeon: Eloise Harman, DO;  [facility];  Service:  Endoscopy;;   POLYPECTOMY  11/01/2018   Procedure: POLYPECTOMY;  Surgeon: Daneil Dolin, MD;  [facility];  Service: Endoscopy;;  cecum,splenic flexure   REFRACTIVE SURGERY Left 08/01/2021   Cleaned cataract    Social History   Socioeconomic History   Marital status: Married    Spouse name: Not on file   Number of children: 2   Years of education: college   Highest education level: Not on file  Occupational History   Occupation: Theme park manager    Employer: [employer]  Tobacco Use   Smoking status: Never    Passive exposure: Never   Smokeless tobacco: Never  Vaping Use   Vaping Use: Never used  Substance and Sexual Activity   Alcohol use: No   Drug use: No   Sexual activity: Never  Other Topics Concern   Not on file  Social History Narrative   Patient lives with his wife    Patient right handed   Patient drinks caffine on occ.   Social Determinants of Health   Financial Resource Strain: Not on file  Food Insecurity: Not on file  Transportation Needs: Not on file  Physical Activity: Not on file  Stress: Not on file  Social Connections: Not on file  Intimate Partner Violence: Not on file    Family History  Problem Relation Age of Onset   Diabetes Mother    Hypertension Mother    Cancer Father        lung   Cancer Sister        breast   Sickle cell anemia Daughter    Cancer Maternal Grandfather        prostate   Colon cancer Neg Hx        Objective: Vitals:   09/28/22 1014  BP: 115/70  Pulse: 81      Physical Exam Vitals reviewed.  Constitutional:      Appearance: Normal appearance.  Neurological:     Mental Status: He is alert.     Lab Results:  PSA PSA  Date Value Ref Range Status  03/16/2020 <0.1 < OR = 4.0 ng/mL Final    Comment:    The total PSA value from this assay system is  standardized against the WHO standard. The test  result will be approximately 20% lower when compared  to the  equimolar-standardized total PSA (Beckman  Coulter). Comparison of serial PSA results should be  interpreted with this fact in mind. . This test was performed using the Siemens  chemiluminescent method. Values obtained from  different assay methods cannot be used interchangeably. PSA levels, regardless of value, should not be interpreted as absolute evidence of the presence or absence of disease.   01/03/2017 6.8 (H) <=4.0 ng/mL Final    Comment:      The total PSA value from this assay system is standardized against the WHO standard. The test result will be approximately 20% lower when compared to the equimolar-standardized total PSA (Beckman Coulter). Comparison of serial PSA results should be interpreted with this fact in mind.   This test was performed using the Siemens chemiluminescent method. Values obtained from different assay methods cannot be used interchangeably. PSA levels, regardless of value, should not be interpreted as absolute evidence of the presence or absence of disease.      Testosterone  Date Value Ref Range Status  07/05/2022 5 (L) 264 - 916 ng/dL Final    Comment:    Adult male reference interval is based on a population of healthy nonobese males (BMI <30) between 3 and 20 years old. [place], [place] [phone]. PMID: [id number].   03/09/2022 10 (L) 264 - 916 ng/dL Final    Comment:    Adult male reference interval is based on a population of healthy nonobese males (BMI <30) between 1 and 40 years old. [place], [place] [phone]. PMID: [id number].   03/10/2021 19 (L) 264 - 916 ng/dL Final    Comment:    Adult male reference interval is based on a population of healthy  nonobese males (BMI <30) between 42 and 42 years old. [place], [place] [phone]. PMID: [id number].    Lab Results  Component Value Date   PSA1 0.9 09/19/2022   PSA1 0.6 07/05/2022   PSA1 0.2 03/09/2022      Studies/Results: No  results found.  No results found.     Assessment & Plan: Prostate cancer.  His PSA continues to increase and he didn't have the PSMA PET I had ordered in October.  Urge incontinence.  He continues to do well on Myrbetriq '25mg'$  and additional samples were given. Microhematuria.   He had radiation cystitis on cystoscopy and last had hematuria a few months ago.   Meds ordered this encounter  Medications   leuprolide (6 Month) (ELIGARD) injection 45 mg     Orders Placed This Encounter  Procedures   Urinalysis, Routine w reflex microscopic   PSA    Standing Status:   Future    Standing Expiration Date:   03/29/2023   Testosterone    Standing Status:   Future    Standing Expiration Date:   03/29/2023       Return in about 3 months (around 12/28/2022) for Needs PSMA PET I ordered in October and f/u in 3 months with labs. .   CC: Celene Squibb, MD      Irine Seal 09/28/2022 Patient ID: [name], male   DOB: 28-Dec-1950, 71 y.o.   MRN: [id number]

## 2022-09-28 NOTE — Progress Notes (Signed)
Eligard SubQ Injection   Due to Prostate Cancer patient is present today for a Eligard Injection.  Medication: Eligard 6 month Dose: 45 mg  Location: right  Lot: [id number]  Exp: [id number]  Patient tolerated well, no complications were noted  Performed by: Gibson Ramp RN

## 2022-10-10 DIAGNOSIS — M542 Cervicalgia: Secondary | ICD-10-CM | POA: Diagnosis not present

## 2022-10-10 DIAGNOSIS — M25511 Pain in right shoulder: Secondary | ICD-10-CM | POA: Diagnosis not present

## 2022-10-10 DIAGNOSIS — M25512 Pain in left shoulder: Secondary | ICD-10-CM | POA: Diagnosis not present

## 2022-10-17 DIAGNOSIS — H26491 Other secondary cataract, right eye: Secondary | ICD-10-CM | POA: Diagnosis not present

## 2022-10-31 DIAGNOSIS — N1832 Chronic kidney disease, stage 3b: Secondary | ICD-10-CM | POA: Diagnosis not present

## 2022-10-31 DIAGNOSIS — N183 Chronic kidney disease, stage 3 unspecified: Secondary | ICD-10-CM | POA: Diagnosis not present

## 2022-11-07 DIAGNOSIS — C61 Malignant neoplasm of prostate: Secondary | ICD-10-CM | POA: Diagnosis not present

## 2022-11-07 DIAGNOSIS — E1122 Type 2 diabetes mellitus with diabetic chronic kidney disease: Secondary | ICD-10-CM | POA: Diagnosis not present

## 2022-11-07 DIAGNOSIS — Z89511 Acquired absence of right leg below knee: Secondary | ICD-10-CM | POA: Diagnosis not present

## 2022-11-07 DIAGNOSIS — N1831 Chronic kidney disease, stage 3a: Secondary | ICD-10-CM | POA: Diagnosis not present

## 2022-11-07 DIAGNOSIS — E559 Vitamin D deficiency, unspecified: Secondary | ICD-10-CM | POA: Diagnosis not present

## 2022-11-07 DIAGNOSIS — I129 Hypertensive chronic kidney disease with stage 1 through stage 4 chronic kidney disease, or unspecified chronic kidney disease: Secondary | ICD-10-CM | POA: Diagnosis not present

## 2022-12-14 ENCOUNTER — Encounter (HOSPITAL_COMMUNITY)
Admission: RE | Admit: 2022-12-14 | Discharge: 2022-12-14 | Disposition: A | Discharge status: Home / Self Care | Payer: Medicare Other | Source: Ambulatory Visit | Attending: Urology | Admitting: Urology

## 2022-12-14 DIAGNOSIS — C61 Malignant neoplasm of prostate: Secondary | ICD-10-CM | POA: Diagnosis not present

## 2022-12-14 DIAGNOSIS — R9721 Rising PSA following treatment for malignant neoplasm of prostate: Secondary | ICD-10-CM | POA: Insufficient documentation

## 2022-12-14 DIAGNOSIS — I2510 Atherosclerotic heart disease of native coronary artery without angina pectoris: Secondary | ICD-10-CM | POA: Diagnosis not present

## 2022-12-14 DIAGNOSIS — K8020 Calculus of gallbladder without cholecystitis without obstruction: Secondary | ICD-10-CM | POA: Diagnosis not present

## 2022-12-14 DIAGNOSIS — K5730 Diverticulosis of large intestine without perforation or abscess without bleeding: Secondary | ICD-10-CM | POA: Diagnosis not present

## 2022-12-14 MED ORDER — PIFLIFOLASTAT F 18 (PYLARIFY) INJECTION
9.0000 | Freq: Once | INTRAVENOUS | Status: AC
  Administered 2022-12-14: 8.39 via INTRAVENOUS

## 2022-12-20 ENCOUNTER — Telehealth: Payer: Self-pay

## 2022-12-20 NOTE — Telephone Encounter (Signed)
Patient aware of MD response and will f/u as scheduled.

## 2022-12-20 NOTE — Telephone Encounter (Signed)
-----   Message from Irine Seal, MD sent at 12/18/2022 10:12 PM EST ----- He only has uptake of the tracer in the prostate and no evidence of spread beyond the prostate.  He should f/u as planned to discuss the results and management options.  ----- Message ----- From: Audie Box, CMA Sent: 12/18/2022  12:40 PM EST To: Irine Seal, MD  F/u 02/29 please review.

## 2022-12-26 NOTE — Progress Notes (Unsigned)
GI Office Note    Referring Provider: Celene Squibb, MD Primary Care Physician:  Celene Squibb, MD  Primary Gastroenterologist: Elon Alas. Abbey Chatters, DO   Chief Complaint   No chief complaint on file.   History of Present Illness   [name] is a 72 y.o. male presenting today for follow-up.  Last seen in October 2023.   Patient has a history of radiation proctitis, rectal bleeding, and adenomatous colon polyps. He had flex sig with APC treatment of radiation proctitis on 10/27/19 (actively bleeding during exam). He had APC at time of colonoscopy in 10/2018 as well. Next TCS due in 2025. Also with history of anemia likely secondary to chronic disease. Hemoglobin 11 on 03/09/2022, consistent with baseline.    Underwent flexible sigmoidoscopy 03/13/2022 due to worsening rectal bleeding.  Numerous rectal AVMs actively oozing, 1 sigmoid AVM, all treated with APC.     Medications   Current Outpatient Medications  Medication Sig Dispense Refill   acetaminophen (TYLENOL) 325 MG tablet Take 650 mg by mouth daily as needed for mild pain, moderate pain or headache.     atenolol (TENORMIN) 25 MG tablet TAKE 1 TABLET BY MOUTH EVERY DAY 90 tablet 1   atorvastatin (LIPITOR) 80 MG tablet TAKE 1 TABLET BY MOUTH EVERY DAY 90 tablet 1   B-D UF III MINI PEN NEEDLES 31G X 5 MM MISC USE FOUR TIMES DAILY AS DIRECTED 150 each 2   Calcium Carb-Cholecalciferol 600-500 MG-UNIT CAPS Take 2 capsules by mouth daily.      clopidogrel (PLAVIX) 75 MG tablet TAKE 1 TABLET BY MOUTH EVERY DAY 90 tablet 1   CONTOUR TEST test strip TEST TWICE DAILY E11.65 200 each 2   furosemide (LASIX) 20 MG tablet Take 20 mg by mouth 3 (three) times a week.     glucose blood test strip 1 each by Other route 2 (two) times daily. Use as instructed bid. E11.65 Contour next 300 each 2   insulin degludec (TRESIBA FLEXTOUCH) 100 UNIT/ML SOPN FlexTouch Pen Inject 30 Units into the skin at bedtime.     latanoprost (XALATAN) 0.005 %  ophthalmic solution Place 1 drop into both eyes at bedtime.     mirabegron ER (MYRBETRIQ) 25 MG TB24 tablet Take 1 tablet (25 mg total) by mouth every other day. 56 tablet 0   Semaglutide, 1 MG/DOSE, 4 MG/3ML SOPN Inject 1 mg as directed once a week. 6 mL 3   No current facility-administered medications for this visit.    Allergies   Allergies as of 12/27/2022 - Review Complete 09/28/2022  Allergen Reaction Noted   Zolpidem tartrate Other (See Comments) 04/11/2011     Past Medical History   Past Medical History:  Diagnosis Date   Arthritis    Cerebrovascular disease    MRI shows Right carotid inferior cavernours narrowing 75% and  50-75% stenosis of Cavernous and supraclinoi right side   CKD (chronic kidney disease) 06/28/2014   Sees Dr Florene Glen   Diabetic retinopathy [facility])    Hyperlipidemia    Hypertension    Myocardial infarction [facility])    "mild" heart attack   Necrosis ([place])    #2 nail    Pneumonia    as a child   Poor circulation of extremity    Prostate cancer ([place]) 2017   Prostate   Sleep apnea    uses cpap, getting a new one   Type 2 Diabetes mellitus    Type 2  Past Surgical History   Past Surgical History:  Procedure Laterality Date   ABOVE KNEE LEG AMPUTATION Left 2004   started out below knee and then extended to above knee due to poor healing   AMPUTATION Right 04/28/2015   Procedure: AMPUTATION RAY, RIGHT 5TH TOE;  Surgeon: Marybelle Killings, MD;  Location: [place];  Service: Orthopedics;  Laterality: Right;   AMPUTATION Right 05/10/2015   Procedure: Right Below Knee Amputation;  Surgeon: Marybelle Killings, MD;  Location: [place];  Service: Orthopedics;  Laterality: Right;   CATARACT EXTRACTION W/PHACO  09/05/2012   Procedure: CATARACT EXTRACTION PHACO AND INTRAOCULAR LENS PLACEMENT (IOC);  Surgeon: Tonny Branch, MD;  Location: [facility];  Service: Ophthalmology;  Laterality: Left;  CDE=5.45   CATARACT EXTRACTION W/PHACO  10/03/2012   Procedure: CATARACT EXTRACTION PHACO  AND INTRAOCULAR LENS PLACEMENT (IOC);  Surgeon: Tonny Branch, MD;  Location: [facility];  Service: Ophthalmology;  Laterality: Right;  CDE: 12.31   COLONOSCOPY  2011   Dr. Deatra Ina: colon polyps, tubular adenoma   COLONOSCOPY N/A 11/01/2018   Dr. Gala Romney: Blood noted in the rectal vault.  Vascular pattern of the rectum abnormal, neovascular changes distally and actively oozing.  There were 3 2 to 5 mm polyps in the splenic flexure, cecum removed.  Cecal polyp was not recovered.  Radiation proctitis status post APC treatment.  The splenic flexure polyps were tubular adenomas.  Next colonoscopy in 5 years.   FLEXIBLE SIGMOIDOSCOPY N/A 10/27/2019   Procedure: FLEXIBLE SIGMOIDOSCOPY;  Surgeon: Danie Binder, MD; rectal bleeding due to radiation proctitis s/p APC therapy (actively bleeding during exam), rectosigmoid colon and sigmoid colon appeared normal.   FLEXIBLE SIGMOIDOSCOPY N/A 03/13/2022   Procedure: FLEXIBLE SIGMOIDOSCOPY;  Surgeon: Eloise Harman, DO;  [facility];  Service: Endoscopy;  Laterality: N/A;  1:45pm   HOT HEMOSTASIS  03/13/2022   Procedure: HOT HEMOSTASIS (ARGON PLASMA COAGULATION/BICAP);  Surgeon: Eloise Harman, DO;  [facility];  Service: Endoscopy;;   POLYPECTOMY  11/01/2018   Procedure: POLYPECTOMY;  Surgeon: Daneil Dolin, MD;  [facility];  Service: Endoscopy;;  cecum,splenic flexure   REFRACTIVE SURGERY Left 08/01/2021   Cleaned cataract    Past Family History   Family History  Problem Relation Age of Onset   Diabetes Mother    Hypertension Mother    Cancer Father        lung   Cancer Sister        breast   Sickle cell anemia Daughter    Cancer Maternal Grandfather        prostate   Colon cancer Neg Hx     Past Social History   Social History   Socioeconomic History   Marital status: Married    Spouse name: Not on file   Number of children: 2   Years of education: college   Highest education level: Not on file   Occupational History   Occupation: Mining engineer: [employer]  Tobacco Use   Smoking status: Never    Passive exposure: Never   Smokeless tobacco: Never  Vaping Use   Vaping Use: Never used  Substance and Sexual Activity   Alcohol use: No   Drug use: No   Sexual activity: Never  Other Topics Concern   Not on file  Social History Narrative   Patient lives with his wife    Patient right handed   Patient drinks caffine on occ.  Social Determinants of Health   Financial Resource Strain: Not on file  Food Insecurity: Not on file  Transportation Needs: Not on file  Physical Activity: Not on file  Stress: Not on file  Social Connections: Not on file  Intimate Partner Violence: Not on file    Review of Systems   General: Negative for anorexia, weight loss, fever, chills, fatigue, weakness. ENT: Negative for hoarseness, difficulty swallowing , nasal congestion. CV: Negative for chest pain, angina, palpitations, dyspnea on exertion, peripheral edema.  Respiratory: Negative for dyspnea at rest, dyspnea on exertion, cough, sputum, wheezing.  GI: See history of present illness. GU:  Negative for dysuria, hematuria, urinary incontinence, urinary frequency, nocturnal urination.  Endo: Negative for unusual weight change.     Physical Exam   There were no vitals taken for this visit.   General: Well-nourished, well-developed in no acute distress.  Eyes: No icterus. Mouth: Oropharyngeal mucosa moist and pink , no lesions erythema or exudate. Lungs: Clear to auscultation bilaterally.  Heart: Regular rate and rhythm, no murmurs rubs or gallops.  Abdomen: Bowel sounds are normal, nontender, nondistended, no hepatosplenomegaly or masses,  no abdominal bruits or hernia , no rebound or guarding.  Rectal: ***  Extremities: No lower extremity edema. No clubbing or deformities. Neuro: Alert and oriented x 4   Skin: Warm and dry, no jaundice.   Psych: Alert and  cooperative, normal mood and affect.  Labs   Lab Results  Component Value Date   HGBA1C 6.5 (A) 08/15/2022   Lab Results  Component Value Date   WBC 5.9 03/09/2022   HGB 11.0 (L) 03/09/2022   HCT 33.1 (L) 03/09/2022   MCV 90.7 03/09/2022   PLT 234 03/09/2022   Lab Results  Component Value Date   ALT 44 (A) 09/01/2021   AST 33 09/01/2021   ALKPHOS 149 (A) 09/01/2021   BILITOT 0.3 08/05/2021   Lab Results  Component Value Date   CREATININE 1.80 (H) 03/25/2022   BUN 31 (H) 03/09/2022   NA 141 03/09/2022   K 4.1 03/09/2022   CL 112 (H) 03/09/2022   CO2 20 (L) 03/09/2022   Labs from January 2024: Creatinine 1.5, BUN 29, albumin 3.4, hemoglobin 12.9,  Imaging Studies   NM PET (PSMA) SKULL TO MID THIGH  Result Date: 12/15/2022 CLINICAL DATA:  Prostate carcinoma with biochemical recurrence. Postradiation. EXAM: NUCLEAR MEDICINE PET SKULL BASE TO THIGH TECHNIQUE: 8.4 mCi F18 Piflufolastat (Pylarify) was injected intravenously. Full-ring PET imaging was performed from the skull base to thigh after the radiotracer. CT data was obtained and used for attenuation correction and anatomic localization. COMPARISON:  PET-CT June 08, 2017 and CT abdomen pelvis Mar 25, 2022. FINDINGS: NECK No radiotracer activity in neck lymph nodes. Incidental CT finding: None. CHEST No radiotracer accumulation within mediastinal or hilar lymph nodes. No suspicious pulmonary nodules on the CT scan. Incidental CT finding: Coronary artery calcifications. Aortic calcifications. Bilateral reticular opacities with clustered micronodularity. Gynecomastia. ABDOMEN/PELVIS Prostate: Marked radiotracer uptake within the prostate gland with a max SUV of 17.8. Lymph nodes: No definite abnormal radiotracer accumulation within pelvic or abdominal nodes. Liver: No evidence of liver metastasis. Incidental CT finding: Colonic diverticulosis. Symmetric bladder wall thickening. Cholelithiasis without findings of acute  cholecystitis. SKELETON No focal activity to suggest skeletal metastasis. Left AKA and right BKA. Diffuse muscular fatty atrophy most prominent in the paraspinal muscles, gluteal muscles and muscles of the bilateral thighs. IMPRESSION: 1. Marked radiotracer uptake within the prostate gland compatible with residual/recurrent  prostate cancer. 2. No convincing evidence of radiotracer avid nodal or distant metastatic disease. 3. Bilateral reticular opacities with clustered micronodularity, likely infectious or inflammatory. Suggest further evaluation with nonemergent ILD protocol CT with inspiratory and expiratory views. 4. Cholelithiasis without findings of acute cholecystitis. 5. Colonic diverticulosis without findings of acute diverticulitis. 6. Diffuse muscular fatty atrophy most prominent in the paraspinal muscles, gluteal muscles and muscles of the bilateral thighs. 7.  Aortic Atherosclerosis (ICD10-I70.0). Electronically Signed   By: Dahlia Bailiff M.D.   On: 12/15/2022 15:46    Assessment       PLAN   ***   Laureen Ochs. Bobby Rumpf, [place], [facility]

## 2022-12-27 ENCOUNTER — Encounter: Payer: Self-pay | Admitting: Gastroenterology

## 2022-12-27 ENCOUNTER — Ambulatory Visit (INDEPENDENT_AMBULATORY_CARE_PROVIDER_SITE_OTHER): Payer: Medicare Other | Admitting: Gastroenterology

## 2022-12-27 VITALS — BP 116/74 | HR 96 | Temp 97.7°F | Ht 71.5 in | Wt 254.6 lb

## 2022-12-27 DIAGNOSIS — K627 Radiation proctitis: Secondary | ICD-10-CM | POA: Diagnosis not present

## 2022-12-27 DIAGNOSIS — Q2730 Arteriovenous malformation, site unspecified: Secondary | ICD-10-CM

## 2022-12-27 NOTE — Patient Instructions (Signed)
Return to the office in six months or call sooner if needed.   It was a pleasure seeing you today!

## 2022-12-28 ENCOUNTER — Ambulatory Visit: Payer: Medicare Other | Admitting: Urology

## 2022-12-28 ENCOUNTER — Encounter: Payer: Self-pay | Admitting: Urology

## 2022-12-28 ENCOUNTER — Ambulatory Visit (INDEPENDENT_AMBULATORY_CARE_PROVIDER_SITE_OTHER): Payer: Medicare Other | Admitting: Urology

## 2022-12-28 VITALS — BP 121/65 | HR 91 | Ht 71.0 in | Wt 254.0 lb

## 2022-12-28 DIAGNOSIS — N3941 Urge incontinence: Secondary | ICD-10-CM

## 2022-12-28 DIAGNOSIS — Z192 Hormone resistant malignancy status: Secondary | ICD-10-CM

## 2022-12-28 DIAGNOSIS — R351 Nocturia: Secondary | ICD-10-CM | POA: Diagnosis not present

## 2022-12-28 DIAGNOSIS — C61 Malignant neoplasm of prostate: Secondary | ICD-10-CM | POA: Diagnosis not present

## 2022-12-28 DIAGNOSIS — N390 Urinary tract infection, site not specified: Secondary | ICD-10-CM

## 2022-12-28 DIAGNOSIS — R350 Frequency of micturition: Secondary | ICD-10-CM | POA: Diagnosis not present

## 2022-12-28 DIAGNOSIS — R9721 Rising PSA following treatment for malignant neoplasm of prostate: Secondary | ICD-10-CM

## 2022-12-28 DIAGNOSIS — N3040 Irradiation cystitis without hematuria: Secondary | ICD-10-CM

## 2022-12-28 LAB — URINALYSIS, ROUTINE W REFLEX MICROSCOPIC
Bilirubin, UA: NEGATIVE
Glucose, UA: NEGATIVE
Ketones, UA: NEGATIVE
Nitrite, UA: POSITIVE — AB
Protein,UA: NEGATIVE
Specific Gravity, UA: 1.015 (ref 1.005–1.030)
Urobilinogen, Ur: 0.2 mg/dL (ref 0.2–1.0)
pH, UA: 5.5 (ref 5.0–7.5)

## 2022-12-28 LAB — MICROSCOPIC EXAMINATION: WBC, UA: >30 /hpf — AB (ref 0–5)

## 2022-12-28 MED ORDER — GEMTESA 75 MG PO TABS: 75.0000 mg | ORAL_TABLET | Freq: Every day | ORAL | 0 refills | Status: DC

## 2022-12-28 NOTE — Progress Notes (Signed)
Subjective:  1. Rising PSA following treatment for malignant neoplasm of prostate   2. 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 Urge incontinence   4. Nocturia   5. Radiation cystitis   6. Urinary tract infection without hematuria, site unspecified     12/28/22: [name] returns today in f/u for the history below.   His PSA has been rising on Eligard but he didn't get a PSA prior to this visit but had a PSMA PET on 12/14/22 that showed only prostate uptake and no distant disease.  He has been on Eligard with the last dose on 09/28/22.  His IPSS is 9 with some increased frequency and near UUI on Myrbetriq '25mg'$ .   He has no bone pain or weight loss.  He has no GI complaints.   HIs UA today is concerning for infection.   09/28/22: [name] returns today in f/u for his history of prostate cancer.  His PSA has been rising over the past year from <0.1 on 09/01/21 to 0.2 on 03/09/22, 0.6 on 07/05/22 and 0.9 on 09/19/22.  His last testosterone level was 5 on 07/05/22.  He has been on Eligard with the last dose on 03/16/22.  He had no evidence of mets on his CT in May.  He remains on Myrbetriq '25mg'$  qd for OAB.  He has CKD3b. He has had no weight loss.  He has had no bone pain.  He is voiding ok with no incontinence but some persistent urgency.  He had hematuria 4-5 months ago but none since.   03/30/22: [name] returns today in f/u for cystoscopy.  The CT hematuria study showed mild bladder wall thickening.    03/16/22: [name] is a 72yo here for followup for a history of T3 N1 M0 Gleason 9 prostate cancer with right external iliac nodal disease at diagnosis in 2018 and urge incontinence. He had radiation in 2018.  He has mild LUTS and mild urgency with mirabegron '25mg'$  every other day.  He has nocturia x 1.  He has had no hematuria but his UA today has 3-10 RBC's. PSA is 0.2  and his Testosterone is 10 in 5/22. He is due for eligard today.  UA is clear today.   He has no weight loss or  bone pain and he has rare hot flashes.  He had endoscopy on Monday for rectal bleeding from radiation cystitis and had some fulguration.  His Cr is 1.7 with a GFR of 43.   He has not had any abdominal imaging since 2018.       ROS:  ROS:  A complete review of systems was performed.  All systems are negative except for pertinent findings as noted.   Review of Systems  Genitourinary:  Positive for frequency and urgency.  All other systems reviewed and are negative.   Allergies  Allergen Reactions   Zolpidem Tartrate Other (See Comments)    disorientation     Outpatient Encounter Medications as of 12/28/2022  Medication Sig   acetaminophen (TYLENOL) 325 MG tablet Take 650 mg by mouth daily as needed for mild pain, moderate pain or headache.   atenolol (TENORMIN) 25 MG tablet TAKE 1 TABLET BY MOUTH EVERY DAY   atorvastatin (LIPITOR) 80 MG tablet TAKE 1 TABLET BY MOUTH EVERY DAY   B-D UF III MINI PEN NEEDLES 31G X 5 MM MISC USE FOUR TIMES DAILY AS DIRECTED   Calcium Carb-Cholecalciferol 600-500 MG-UNIT CAPS Take 2 capsules by mouth daily.  clopidogrel (PLAVIX) 75 MG tablet TAKE 1 TABLET BY MOUTH EVERY DAY   CONTOUR TEST test strip TEST TWICE DAILY E11.65   furosemide (LASIX) 20 MG tablet Take 20 mg by mouth 3 (three) times a week.   glucose blood test strip 1 each by Other route 2 (two) times daily. Use as instructed bid. E11.65 Contour next   insulin degludec (TRESIBA FLEXTOUCH) 100 UNIT/ML SOPN FlexTouch Pen Inject 30 Units into the skin at bedtime.   latanoprost (XALATAN) 0.005 % ophthalmic solution Place 1 drop into both eyes at bedtime.   Semaglutide, 1 MG/DOSE, 4 MG/3ML SOPN Inject 1 mg as directed once a week.   Vibegron (GEMTESA) 75 MG TABS Take 1 tablet (75 mg total) by mouth daily.   [DISCONTINUED] mirabegron ER (MYRBETRIQ) 25 MG TB24 tablet Take 1 tablet (25 mg total) by mouth every other day.   No facility-administered encounter medications on file as of 12/28/2022.     Past Medical History:  Diagnosis Date   Arthritis    Cerebrovascular disease    MRI shows Right carotid inferior cavernours narrowing 75% and  50-75% stenosis of Cavernous and supraclinoi right side   CKD (chronic kidney disease) 06/28/2014   Sees Dr Florene Glen   Diabetic retinopathy [facility])    Hyperlipidemia    Hypertension    Myocardial infarction [facility])    "mild" heart attack   Necrosis ([place])    #2 nail    Pneumonia    as a child   Poor circulation of extremity    Prostate cancer ([place]) 2017   Prostate   Sleep apnea    uses cpap, getting a new one   Type 2 Diabetes mellitus    Type 2    Past Surgical History:  Procedure Laterality Date   ABOVE KNEE LEG AMPUTATION Left 2004   started out below knee and then extended to above knee due to poor healing   AMPUTATION Right 04/28/2015   Procedure: AMPUTATION RAY, RIGHT 5TH TOE;  Surgeon: Marybelle Killings, MD;  Location: [place];  Service: Orthopedics;  Laterality: Right;   AMPUTATION Right 05/10/2015   Procedure: Right Below Knee Amputation;  Surgeon: Marybelle Killings, MD;  Location: [place];  Service: Orthopedics;  Laterality: Right;   CATARACT EXTRACTION W/PHACO  09/05/2012   Procedure: CATARACT EXTRACTION PHACO AND INTRAOCULAR LENS PLACEMENT (IOC);  Surgeon: Tonny Branch, MD;  Location: [facility];  Service: Ophthalmology;  Laterality: Left;  CDE=5.45   CATARACT EXTRACTION W/PHACO  10/03/2012   Procedure: CATARACT EXTRACTION PHACO AND INTRAOCULAR LENS PLACEMENT (IOC);  Surgeon: Tonny Branch, MD;  Location: [facility];  Service: Ophthalmology;  Laterality: Right;  CDE: 12.31   COLONOSCOPY  2011   Dr. Deatra Ina: colon polyps, tubular adenoma   COLONOSCOPY N/A 11/01/2018   Dr. Gala Romney: Blood noted in the rectal vault.  Vascular pattern of the rectum abnormal, neovascular changes distally and actively oozing.  There were 3 2 to 5 mm polyps in the splenic flexure, cecum removed.  Cecal polyp was not recovered.  Radiation proctitis status post APC treatment.  The  splenic flexure polyps were tubular adenomas.  Next colonoscopy in 5 years.   FLEXIBLE SIGMOIDOSCOPY N/A 10/27/2019   Procedure: FLEXIBLE SIGMOIDOSCOPY;  Surgeon: Danie Binder, MD; rectal bleeding due to radiation proctitis s/p APC therapy (actively bleeding during exam), rectosigmoid colon and sigmoid colon appeared normal.   FLEXIBLE SIGMOIDOSCOPY N/A 03/13/2022   Procedure: FLEXIBLE SIGMOIDOSCOPY;  Surgeon: Eloise Harman, DO;  [facility];  Service: Endoscopy;  Laterality: N/A;  1:45pm   HOT HEMOSTASIS  03/13/2022   Procedure: HOT HEMOSTASIS (ARGON PLASMA COAGULATION/BICAP);  Surgeon: Eloise Harman, DO;  [facility];  Service: Endoscopy;;   POLYPECTOMY  11/01/2018   Procedure: POLYPECTOMY;  Surgeon: Daneil Dolin, MD;  [facility];  Service: Endoscopy;;  cecum,splenic flexure   REFRACTIVE SURGERY Left 08/01/2021   Cleaned cataract    Social History   Socioeconomic History   Marital status: Married    Spouse name: Not on file   Number of children: 2   Years of education: college   Highest education level: Not on file  Occupational History   Occupation: Mining engineer: [employer]  Tobacco Use   Smoking status: Never    Passive exposure: Never   Smokeless tobacco: Never  Vaping Use   Vaping Use: Never used  Substance and Sexual Activity   Alcohol use: No   Drug use: No   Sexual activity: Not Currently  Other Topics Concern   Not on file  Social History Narrative   Patient lives with his wife    Patient right handed   Patient drinks caffine on occ.   Social Determinants of Health   Financial Resource Strain: Not on file  Food Insecurity: Not on file  Transportation Needs: Not on file  Physical Activity: Not on file  Stress: Not on file  Social Connections: Not on file  Intimate Partner Violence: Not on file    Family History  Problem Relation Age of Onset   Diabetes Mother    Hypertension Mother     Cancer Father        lung   Cancer Sister        breast   Sickle cell anemia Daughter    Cancer Maternal Grandfather        prostate   Colon cancer Neg Hx        Objective: Vitals:   12/28/22 1118  BP: 121/65  Pulse: 91      Physical Exam Vitals reviewed.  Constitutional:      Appearance: Normal appearance.  Neurological:     Mental Status: He is alert.     Lab Results:  PSA PSA  Date Value Ref Range Status  03/16/2020 <0.1 < OR = 4.0 ng/mL Final    Comment:    The total PSA value from this assay system is  standardized against the WHO standard. The test  result will be approximately 20% lower when compared  to the equimolar-standardized total PSA (Beckman  Coulter). Comparison of serial PSA results should be  interpreted with this fact in mind. . This test was performed using the Siemens  chemiluminescent method. Values obtained from  different assay methods cannot be used interchangeably. PSA levels, regardless of value, should not be interpreted as absolute evidence of the presence or absence of disease.   01/03/2017 6.8 (H) <=4.0 ng/mL Final    Comment:      The total PSA value from this assay system is standardized against the WHO standard. The test result will be approximately 20% lower when compared to the equimolar-standardized total PSA (Beckman Coulter). Comparison of serial PSA results should be interpreted with this fact in mind.   This test was performed using the Siemens chemiluminescent method. Values obtained from different assay methods cannot be used interchangeably. PSA levels, regardless of value, should not be interpreted as absolute evidence of the presence or  absence of disease.      Testosterone  Date Value Ref Range Status  12/28/2022 10 (L) 264 - 916 ng/dL Final    Comment:    Adult male reference interval is based on a population of healthy nonobese males (BMI <30) between 70 and 54 years old. [place], [place] [phone]. PMID: [id number].   07/05/2022 5 (L) 264 - 916 ng/dL Final    Comment:    Adult male reference interval is based on a population of healthy nonobese males (BMI <30) between 53 and 38 years old. [place], [place] [phone]. PMID: [id number].   03/09/2022 10 (L) 264 - 916 ng/dL Final    Comment:    Adult male reference interval is based on a population of healthy nonobese males (BMI <30) between 62 and 69 years old. [place], [place] [phone]. PMID: [id number].    Lab Results  Component Value Date   PSA1 1.8 12/28/2022   PSA1 0.9 09/19/2022   PSA1 0.6 07/05/2022   UA reviewed.  Results for orders placed or performed in visit on 12/28/22 (from the past 24 hour(s))  Urinalysis, Routine w reflex microscopic     Status: Abnormal   Collection Time: 12/28/22 11:26 AM  Result Value Ref Range   Specific Gravity, UA 1.015 1.005 - 1.030   pH, UA 5.5 5.0 - 7.5   Color, UA Yellow Yellow   Appearance Ur Cloudy (A) Clear   Leukocytes,UA 1+ (A) Negative   Protein,UA Negative Negative/Trace   Glucose, UA Negative Negative   Ketones, UA Negative Negative   RBC, UA Trace (A) Negative   Bilirubin, UA Negative Negative   Urobilinogen, Ur 0.2 0.2 - 1.0 mg/dL   Nitrite, UA Positive (A) Negative   Microscopic Examination See below:    Narrative   Performed at:  [facility] [address], [place], [place]  [id number] Lab Director: Mina Marble MT, Phone:  [phone]  Microscopic Examination     Status: Abnormal   Collection Time: 12/28/22 11:26 AM   Urine  Result Value Ref Range   WBC, UA >30 (A) 0 - 5 /hpf   RBC, Urine 0-2 0 - 2 /hpf   Epithelial Cells (non renal) 0-10 0 - 10 /hpf   Crystals Present (A) N/A   Crystal Type Amorphous Sediment N/A   Bacteria, UA Many (A) None seen/Few   Narrative   Performed at:  [facility] [address], [place], [place]  [id number] Lab Director: Mina Marble MT, Phone:   [phone]  PSA     Status: None   Collection Time: 12/28/22 11:41 AM  Result Value Ref Range   Prostate Specific Ag, Serum 1.8 0.0 - 4.0 ng/mL   Narrative   Performed at:  [address], [place], [place]  [id number] Lab Director: Rush Farmer MD, Phone:  [phone]  Testosterone     Status: Abnormal   Collection Time: 12/28/22 11:41 AM  Result Value Ref Range   Testosterone 10 (L) 264 - 916 ng/dL   Narrative   Performed at:  [facility] [address], [place], [place]  [id number] Lab Director: Rush Farmer MD, Phone:  [phone]     Studies/Results: No results found.  NM PET (PSMA) SKULL TO MID THIGH  Result Date: 12/15/2022 CLINICAL DATA:  Prostate carcinoma with biochemical recurrence. Postradiation. EXAM: NUCLEAR MEDICINE PET SKULL BASE TO THIGH TECHNIQUE: 8.4 mCi F18 Piflufolastat (Pylarify) was injected intravenously. Full-ring PET imaging was  performed from the skull base to thigh after the radiotracer. CT data was obtained and used for attenuation correction and anatomic localization. COMPARISON:  PET-CT June 08, 2017 and CT abdomen pelvis Mar 25, 2022. FINDINGS: NECK No radiotracer activity in neck lymph nodes. Incidental CT finding: None. CHEST No radiotracer accumulation within mediastinal or hilar lymph nodes. No suspicious pulmonary nodules on the CT scan. Incidental CT finding: Coronary artery calcifications. Aortic calcifications. Bilateral reticular opacities with clustered micronodularity. Gynecomastia. ABDOMEN/PELVIS Prostate: Marked radiotracer uptake within the prostate gland with a max SUV of 17.8. Lymph nodes: No definite abnormal radiotracer accumulation within pelvic or abdominal nodes. Liver: No evidence of liver metastasis. Incidental CT finding: Colonic diverticulosis. Symmetric bladder wall thickening. Cholelithiasis without findings of acute cholecystitis. SKELETON No focal activity to suggest skeletal metastasis. Left  AKA and right BKA. Diffuse muscular fatty atrophy most prominent in the paraspinal muscles, gluteal muscles and muscles of the bilateral thighs. IMPRESSION: 1. Marked radiotracer uptake within the prostate gland compatible with residual/recurrent prostate cancer. 2. No convincing evidence of radiotracer avid nodal or distant metastatic disease. 3. Bilateral reticular opacities with clustered micronodularity, likely infectious or inflammatory. Suggest further evaluation with nonemergent ILD protocol CT with inspiratory and expiratory views. 4. Cholelithiasis without findings of acute cholecystitis. 5. Colonic diverticulosis without findings of acute diverticulitis. 6. Diffuse muscular fatty atrophy most prominent in the paraspinal muscles, gluteal muscles and muscles of the bilateral thighs. 7.  Aortic Atherosclerosis (ICD10-I70.0). Electronically Signed   By: Dahlia Bailiff M.D.   On: 12/15/2022 15:46       Assessment & Plan: Castrate Resistant Prostate cancer.   He has no mets on PSMA PET but he does have locally recurrent disease on Eligard.   I am going to have him seen by Dr. Delton Coombes for consideration of an ARB or Abi/pred.  He has a f/u appointment with [facility] in March and will discuss whether he could have additional RT to the prostate.  (PSA is back from 2/29 and it has doubled to 1.8.) Urge incontinence.  He he has some increased OAB symptoms and I will switch him to Gemtesa samples since we can't increase the Myrbetriq with his CKD. UTI.  His UA has >30 WBC and many bacteria and that might be contributing to his symptoms.  I will get a culture and treat accordingly.   Meds ordered this encounter  Medications   Vibegron (GEMTESA) 75 MG TABS    Sig: Take 1 tablet (75 mg total) by mouth daily.    Dispense:  42 tablet    Refill:  0     Orders Placed This Encounter  Procedures   Urine Culture   Microscopic Examination   Urinalysis, Routine w reflex microscopic   PSA    Testosterone   PSA    Standing Status:   Future    Standing Expiration Date:   06/28/2023   Ambulatory referral to Hematology / Oncology    Referral Priority:   Routine    Referral Type:   Consultation    Referral Reason:   Specialty Services Required    Requested Specialty:   Oncology    Number of Visits Requested:   1       Return in about 3 months (around 03/28/2023) for with PSA for Eligard.   CC: Tom Squibb, MD      Irine Seal 12/29/2022 Patient ID: [name], male   DOB: 03/17/1951, 72 y.o.   MRN: [id number]

## 2022-12-29 ENCOUNTER — Telehealth: Payer: Self-pay

## 2022-12-29 LAB — TESTOSTERONE: Testosterone: 10 ng/dL — ABNORMAL LOW (ref 264–916)

## 2022-12-29 LAB — PSA: Prostate Specific Ag, Serum: 1.8 ng/mL (ref 0.0–4.0)

## 2022-12-29 NOTE — Telephone Encounter (Signed)
-----   Message from Irine Seal, MD sent at 12/29/2022 10:04 AM EST ----- His PSA is up further so we need to get him seen by Dr. Delton Coombes as ordered.   ----- Message ----- From: Audie Box, CMA Sent: 12/29/2022   8:52 AM EST To: Irine Seal, MD  Please review.

## 2022-12-29 NOTE — Telephone Encounter (Signed)
Patient aware of MD response and is already scheduled with Dr. Delton Coombes.

## 2023-01-03 ENCOUNTER — Telehealth: Payer: Self-pay

## 2023-01-03 ENCOUNTER — Other Ambulatory Visit: Payer: Self-pay

## 2023-01-03 DIAGNOSIS — C61 Malignant neoplasm of prostate: Secondary | ICD-10-CM | POA: Diagnosis not present

## 2023-01-03 LAB — URINE CULTURE

## 2023-01-03 MED ORDER — CEPHALEXIN 500 MG PO CAPS: 500.0000 mg | ORAL_CAPSULE | Freq: Three times a day (TID) | ORAL | 0 refills | Status: DC

## 2023-01-03 NOTE — Telephone Encounter (Signed)
-----   Message from Irine Seal, MD sent at 01/03/2023  8:46 AM EST ----- He needs keflex '500mg'$  po tid #21 for his UTI ----- Message ----- From: Audie Box, CMA Sent: 01/03/2023   7:48 AM EST To: Irine Seal, MD  Please review, no treatment started.

## 2023-01-03 NOTE — Telephone Encounter (Signed)
Patient's wife aware of MD response to urine culture.  She is aware of abx at pharmacy and will relay information to her husband.  Instructed her to have him call back with any questions.

## 2023-01-04 ENCOUNTER — Telehealth: Payer: Self-pay | Admitting: Pharmacist

## 2023-01-04 ENCOUNTER — Other Ambulatory Visit: Payer: Self-pay

## 2023-01-04 ENCOUNTER — Inpatient Hospital Stay: Payer: Medicare Other | Attending: Hematology | Admitting: Hematology

## 2023-01-04 ENCOUNTER — Other Ambulatory Visit (HOSPITAL_COMMUNITY): Payer: Self-pay

## 2023-01-04 VITALS — BP 110/73 | HR 67 | Temp 98.3°F | Resp 18 | Ht 71.0 in | Wt 250.9 lb

## 2023-01-04 DIAGNOSIS — Z923 Personal history of irradiation: Secondary | ICD-10-CM | POA: Diagnosis not present

## 2023-01-04 DIAGNOSIS — Z79899 Other long term (current) drug therapy: Secondary | ICD-10-CM | POA: Insufficient documentation

## 2023-01-04 DIAGNOSIS — Z794 Long term (current) use of insulin: Secondary | ICD-10-CM | POA: Diagnosis not present

## 2023-01-04 DIAGNOSIS — I129 Hypertensive chronic kidney disease with stage 1 through stage 4 chronic kidney disease, or unspecified chronic kidney disease: Secondary | ICD-10-CM | POA: Insufficient documentation

## 2023-01-04 DIAGNOSIS — C61 Malignant neoplasm of prostate: Secondary | ICD-10-CM | POA: Diagnosis not present

## 2023-01-04 DIAGNOSIS — Z7902 Long term (current) use of antithrombotics/antiplatelets: Secondary | ICD-10-CM | POA: Diagnosis not present

## 2023-01-04 DIAGNOSIS — E1122 Type 2 diabetes mellitus with diabetic chronic kidney disease: Secondary | ICD-10-CM | POA: Diagnosis not present

## 2023-01-04 DIAGNOSIS — N189 Chronic kidney disease, unspecified: Secondary | ICD-10-CM | POA: Insufficient documentation

## 2023-01-04 MED ORDER — NUBEQA 300 MG PO TABS
600.0000 mg | ORAL_TABLET | Freq: Two times a day (BID) | ORAL | 1 refills | Status: DC
  Filled 2023-01-04: qty 120, 30d supply, fill #0

## 2023-01-04 MED ORDER — PROCHLORPERAZINE MALEATE 10 MG PO TABS: 10.0000 mg | ORAL_TABLET | Freq: Four times a day (QID) | ORAL | 1 refills | Status: AC | PRN

## 2023-01-04 NOTE — Telephone Encounter (Signed)
Oral Oncology Pharmacist Encounter  Received new prescription for Nubeqa (darolutamide) for the treatment of non-metastatic castration resistant prostate in conjunction with ADT, planned duration until disease progression or unacceptable drug toxicity.  CMP/CBC ordered but no recent labs currently in EPIC. Patient scheduled for a lab check on 01/25/23. Prescription dose and frequency assessed.   Current medication list in Epic reviewed, one DDIs with darolutamide identified: Atorvastatin: Darolutamide may increase the serum concentration of atorvastatin. Monitor patients for increased toxicities and effects of atorvastatin. Could consider reducing dose of atorvastatin to '40mg'$  daily  Evaluated chart and no patient barriers to medication adherence identified.   Prescription has been e-scribed to the [facility] for benefits analysis and approval.  Oral Oncology Clinic will continue to follow for insurance authorization, copayment issues, initial counseling and start date.   Darl Pikes, PharmD, BCPS, BCOP, CPP Hematology/Oncology Clinical Pharmacist Practitioner [place] [phone]  01/04/2023 9:31 AM

## 2023-01-04 NOTE — Progress Notes (Signed)
[facility] [address], [place] [address]   Clinic Day:  01/04/2023  Referring physician: Irine Seal, MD  Patient Care Team: Celene Squibb, MD as PCP - General (Internal Medicine) Edythe Clarity, [facility] as Pharmacist (Pharmacist) Eloise Harman, DO as Consulting Physician (Internal Medicine) Eloise Harman, DO as Consulting Physician (Gastroenterology) Derek Jack, MD as Medical Oncologist (Medical Oncology) Brien Mates, RN as Oncology Nurse Navigator (Medical Oncology)   ASSESSMENT & PLAN:   Assessment:  1.  Castration refractory prostate cancer, T3 N1 M0: - 04/27/2017: Prostatic adenocarcinoma Gleason 4+5=9 involving both lobes, PSA 9.8 - Status post long-term ADT plus definitive EBRT from 08/27/2017 through 10/26/2017 - History of radiation-induced cystitis and proctitis - Patient on Eligard every 6 months since 03/19/2020 - PSA <0.1 (09/01/2021), 0.2 (03/09/2022), 0.6 (07/05/2022), 0.9 (09/19/2022), 1.8 (2/29/2022) - PSMA PET (12/14/2022): Marked uptake in the prostate gland compatible with residual/recurrent prostate cancer.  No evidence of nodal or distant metastatic disease.  No focal bone mets.  2.  Social/family history: - Lives at home with his wife.  Independent of ADLs and IADLs.  He had right BKA and left AKA from diabetes and has prosthesis.  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  He is a non-smoker. - Father had lung cancer.  Maternal grand father had prostate cancer.  Sister had breast cancer.  Plan:  1.  M0 Castrate resistant prostate cancer: - We have reviewed his PET scan and his PSA levels. - We talked the options of starting him on androgen receptor blockers and Abi+pred. - Because of his diabetes, I have recommended darolutamide 600 mg twice daily. - We discussed side effects in detail. - I will see him back in 2 weeks after start of darolutamide with labs. - I have recommended germline  mutation testing. - I have also recommended blood based NGS testing.  2.  Bone health: - Will discuss about obtaining a DEXA scan at next visit.   Orders Placed This Encounter  Procedures   CBC with Differential    Standing Status:   Standing    Number of Occurrences:   10    Standing Expiration Date:   01/04/2024   Comprehensive metabolic panel    Standing Status:   Standing    Number of Occurrences:   10    Standing Expiration Date:   01/04/2024   Magnesium    Standing Status:   Standing    Number of Occurrences:   10    Standing Expiration Date:   01/04/2024   PSA    Standing Status:   Future    Standing Expiration Date:   01/04/2024    Order Specific Question:   Release to patient    Answer:   Immediate [1]    Order Specific Question:   Remote health to draw?    Answer:   No   Ambulatory referral to Genetics    Referral Priority:   Routine    Referral Type:   Consultation    Referral Reason:   Specialty Services Required    Number of Visits Requested:   1      I,Alexis Herring,acting as a scribe for Derek Jack, MD.,have documented all relevant documentation on the behalf of Derek Jack, MD,as directed by  Derek Jack, MD while in the presence of Derek Jack, MD.   I, Derek Jack MD, have reviewed the above documentation for accuracy and  completeness, and I agree with the above.   Derek Jack, MD   3/7/20245:14 PM  CHIEF COMPLAINT/PURPOSE OF CONSULT:   Diagnosis: prostate cancer   Cancer Staging  Prostate cancer [facility]) Staging form: Prostate, AJCC 8th Edition - Clinical: Stage IVA (cT1c, cN1, cM0, PSA: 9.8, Grade Group: 5) - Signed by Tyler Pita, MD on 06/09/2017    Prior Therapy: radiation therapy 10/29-12/28/2018  Current Therapy:  leurpolide injections and darolutamide   HISTORY OF PRESENT ILLNESS:   Oncology History   No history exists.      Neldon is a 72 y.o. male presenting to clinic today for  evaluation of prostate cancer and consideration of ARI at the request of Dr. Tana Conch.   Patient was seen by Dr. Sharyn Dross for rad onc follow up on 01/03/23. Note from OV reviewed. Patient has a Hx of radiation-induced cystitis and proctitis. Dr. Lynnette Caffey did not want to proceed with additional RT given these negative effects. He felt that he may be a candidate for local therapy, such as cryotherapy.  Patient completed radiation therapy in 09/2017  Today, he states that he is doing well overall. His appetite level is at 100%. His energy level is at 85%. He denies any new onset pains.   PAST MEDICAL HISTORY:   Past Medical History: Past Medical History:  Diagnosis Date   Arthritis    Cerebrovascular disease    MRI shows Right carotid inferior cavernours narrowing 75% and  50-75% stenosis of Cavernous and supraclinoi right side   CKD (chronic kidney disease) 06/28/2014   Sees Dr Florene Glen   Diabetic retinopathy [facility])    Hyperlipidemia    Hypertension    Myocardial infarction [facility])    "mild" heart attack   Necrosis ([place])    #2 nail    Pneumonia    as a child   Poor circulation of extremity    Prostate cancer ([place]) 2017   Prostate   Sleep apnea    uses cpap, getting a new one   Type 2 Diabetes mellitus    Type 2    Surgical History: Past Surgical History:  Procedure Laterality Date   ABOVE KNEE LEG AMPUTATION Left 2004   started out below knee and then extended to above knee due to poor healing   AMPUTATION Right 04/28/2015   Procedure: AMPUTATION RAY, RIGHT 5TH TOE;  Surgeon: Marybelle Killings, MD;  Location: [facility];  Service: Orthopedics;  Laterality: Right;   AMPUTATION Right 05/10/2015   Procedure: Right Below Knee Amputation;  Surgeon: Marybelle Killings, MD;  Location: [facility];  Service: Orthopedics;  Laterality: Right;   CATARACT EXTRACTION W/PHACO  09/05/2012   Procedure: CATARACT EXTRACTION PHACO AND INTRAOCULAR LENS PLACEMENT (IOC);  Surgeon: Tonny Branch, MD;  Location: [facility];   Service: Ophthalmology;  Laterality: Left;  CDE=5.45   CATARACT EXTRACTION W/PHACO  10/03/2012   Procedure: CATARACT EXTRACTION PHACO AND INTRAOCULAR LENS PLACEMENT (IOC);  Surgeon: Tonny Branch, MD;  Location: [facility];  Service: Ophthalmology;  Laterality: Right;  CDE: 12.31   COLONOSCOPY  2011   Dr. Deatra Ina: colon polyps, tubular adenoma   COLONOSCOPY N/A 11/01/2018   Dr. Gala Romney: Blood noted in the rectal vault.  Vascular pattern of the rectum abnormal, neovascular changes distally and actively oozing.  There were 3 2 to 5 mm polyps in the splenic flexure, cecum removed.  Cecal polyp was not recovered.  Radiation proctitis status post APC treatment.  The splenic flexure polyps were tubular adenomas.  Next  colonoscopy in 5 years.   FLEXIBLE SIGMOIDOSCOPY N/A 10/27/2019   Procedure: FLEXIBLE SIGMOIDOSCOPY;  Surgeon: Danie Binder, MD; rectal bleeding due to radiation proctitis s/p APC therapy (actively bleeding during exam), rectosigmoid colon and sigmoid colon appeared normal.   FLEXIBLE SIGMOIDOSCOPY N/A 03/13/2022   Procedure: FLEXIBLE SIGMOIDOSCOPY;  Surgeon: Eloise Harman, DO;  [facility];  Service: Endoscopy;  Laterality: N/A;  1:45pm   HOT HEMOSTASIS  03/13/2022   Procedure: HOT HEMOSTASIS (ARGON PLASMA COAGULATION/BICAP);  Surgeon: Eloise Harman, DO;  [facility];  Service: Endoscopy;;   POLYPECTOMY  11/01/2018   Procedure: POLYPECTOMY;  Surgeon: Daneil Dolin, MD;  [facility];  Service: Endoscopy;;  cecum,splenic flexure   REFRACTIVE SURGERY Left 08/01/2021   Cleaned cataract    Social History: Social History   Socioeconomic History   Marital status: Married    Spouse name: Not on file   Number of children: 2   Years of education: college   Highest education level: Not on file  Occupational History   Occupation: Mining engineer: [employer]  Tobacco Use   Smoking status: Never    Passive exposure: Never   Smokeless  tobacco: Never  Vaping Use   Vaping Use: Never used  Substance and Sexual Activity   Alcohol use: No   Drug use: No   Sexual activity: Not Currently  Other Topics Concern   Not on file  Social History Narrative   Patient lives with his wife    Patient right handed   Patient drinks caffine on occ.   Social Determinants of Health   Financial Resource Strain: Not on file  Food Insecurity: No Food Insecurity (01/04/2023)   Hunger Vital Sign    Worried About Running Out of Food in the Last Year: Never true    Ran Out of Food in the Last Year: Never true  Transportation Needs: No Transportation Needs (01/04/2023)   PRAPARE - Hydrologist (Medical): No    Lack of Transportation (Non-Medical): No  Physical Activity: Not on file  Stress: Not on file  Social Connections: Not on file  Intimate Partner Violence: Not At Risk (01/04/2023)   Humiliation, Afraid, Rape, and Kick questionnaire    Fear of Current or Ex-Partner: No    Emotionally Abused: No    Physically Abused: No    Sexually Abused: No    Family History: Family History  Problem Relation Age of Onset   Diabetes Mother    Hypertension Mother    Cancer Father        lung   Cancer Sister        breast   Sickle cell anemia Daughter    Cancer Maternal Grandfather        prostate   Colon cancer Neg Hx     Current Medications:  Current Outpatient Medications:    acetaminophen (TYLENOL) 325 MG tablet, Take 650 mg by mouth daily as needed for mild pain, moderate pain or headache., Disp: , Rfl:    atenolol (TENORMIN) 25 MG tablet, TAKE 1 TABLET BY MOUTH EVERY DAY, Disp: 90 tablet, Rfl: 1   atorvastatin (LIPITOR) 80 MG tablet, TAKE 1 TABLET BY MOUTH EVERY DAY, Disp: 90 tablet, Rfl: 1   B-D UF III MINI PEN NEEDLES 31G X 5 MM MISC, USE FOUR TIMES DAILY AS DIRECTED, Disp: 150 each, Rfl: 2   Calcium Carb-Cholecalciferol 600-500 MG-UNIT CAPS, Take 2  capsules by mouth daily. , Disp: , Rfl:    cephALEXin  (KEFLEX) 500 MG capsule, Take 1 capsule (500 mg total) by mouth 3 (three) times daily., Disp: 21 capsule, Rfl: 0   clopidogrel (PLAVIX) 75 MG tablet, TAKE 1 TABLET BY MOUTH EVERY DAY, Disp: 90 tablet, Rfl: 1   CONTOUR TEST test strip, TEST TWICE DAILY E11.65, Disp: 200 each, Rfl: 2   darolutamide (NUBEQA) 300 MG tablet, Take 2 tablets (600 mg total) by mouth 2 (two) times daily with a meal., Disp: 120 tablet, Rfl: 1   furosemide (LASIX) 20 MG tablet, Take 20 mg by mouth 3 (three) times a week., Disp: , Rfl:    glucose blood test strip, 1 each by Other route 2 (two) times daily. Use as instructed bid. E11.65 Contour next, Disp: 300 each, Rfl: 2   insulin degludec (TRESIBA FLEXTOUCH) 100 UNIT/ML SOPN FlexTouch Pen, Inject 30 Units into the skin at bedtime., Disp: , Rfl:    latanoprost (XALATAN) 0.005 % ophthalmic solution, Place 1 drop into both eyes at bedtime., Disp: , Rfl:    prochlorperazine (COMPAZINE) 10 MG tablet, Take 1 tablet (10 mg total) by mouth every 6 (six) hours as needed for nausea or vomiting., Disp: 30 tablet, Rfl: 1   Semaglutide, 1 MG/DOSE, 4 MG/3ML SOPN, Inject 1 mg as directed once a week., Disp: 6 mL, Rfl: 3   Vibegron (GEMTESA) 75 MG TABS, Take 1 tablet (75 mg total) by mouth daily., Disp: 42 tablet, Rfl: 0   Allergies: Allergies  Allergen Reactions   Zolpidem Tartrate Other (See Comments)    disorientation     REVIEW OF SYSTEMS:   Review of Systems  Constitutional:  Negative for chills, fatigue and fever.  HENT:   Negative for lump/mass, mouth sores, nosebleeds, sore throat and trouble swallowing.   Eyes:  Negative for eye problems.  Respiratory:  Positive for cough. Negative for shortness of breath.   Cardiovascular:  Negative for chest pain, leg swelling and palpitations.  Gastrointestinal:  Negative for abdominal pain, constipation, diarrhea, nausea and vomiting.  Genitourinary:  Negative for bladder incontinence, difficulty urinating, dysuria, frequency,  hematuria and nocturia.   Musculoskeletal:  Negative for arthralgias, back pain, flank pain, myalgias and neck pain.  Skin:  Negative for itching and rash.  Neurological:  Negative for dizziness, headaches and numbness.  Hematological:  Does not bruise/bleed easily.  Psychiatric/Behavioral:  Negative for depression, sleep disturbance and suicidal ideas. The patient is not nervous/anxious.   All other systems reviewed and are negative.    VITALS:   Blood pressure 110/73, pulse 67, temperature 98.3 F (36.8 C), temperature source Oral, resp. rate 18, height '5\' 11"'$  (1.803 m), weight 250 lb 14.1 oz (113.8 kg), SpO2 99 %.  Wt Readings from Last 3 Encounters:  01/04/23 250 lb 14.1 oz (113.8 kg)  12/28/22 254 lb (115.2 kg)  12/27/22 254 lb 9.6 oz (115.5 kg)    Body mass index is 34.99 kg/m.  Performance status (ECOG): 1 - Symptomatic but completely ambulatory  PHYSICAL EXAM:   Physical Exam Vitals and nursing note reviewed. Exam conducted with a chaperone present.  Constitutional:      Appearance: Normal appearance.  Cardiovascular:     Rate and Rhythm: Normal rate and regular rhythm.     Pulses: Normal pulses.     Heart sounds: Normal heart sounds.  Pulmonary:     Effort: Pulmonary effort is normal.     Breath sounds: Normal breath sounds.  Abdominal:  Palpations: Abdomen is soft. There is no hepatomegaly, splenomegaly or mass.     Tenderness: There is no abdominal tenderness.  Musculoskeletal:     Right lower leg: No edema.     Left lower leg: No edema.  Lymphadenopathy:     Cervical: No cervical adenopathy.     Right cervical: No superficial, deep or posterior cervical adenopathy.    Left cervical: No superficial, deep or posterior cervical adenopathy.     Upper Body:     Right upper body: No supraclavicular or axillary adenopathy.     Left upper body: No supraclavicular or axillary adenopathy.  Neurological:     General: No focal deficit present.     Mental Status:  He is alert and oriented to person, place, and time.  Psychiatric:        Mood and Affect: Mood normal.        Behavior: Behavior normal.     LABS:      Latest Ref Rng & Units 03/09/2022    1:04 PM 09/01/2021   12:00 AM 11/29/2020    1:20 PM  CBC  WBC 4.0 - 10.5 K/uL 5.9  6.0     5.1   Hemoglobin 13.0 - 17.0 g/dL 11.0  12.2     11.5   Hematocrit 39.0 - 52.0 % 33.1  37     34.9   Platelets 150 - 400 K/uL 234  205     203      This result is from an external source.      Latest Ref Rng & Units 03/25/2022    2:25 PM 03/09/2022    1:04 PM 09/01/2021   12:00 AM  CMP  Glucose 70 - 99 mg/dL  120    BUN 8 - 23 mg/dL  31  28      Creatinine 0.61 - 1.24 mg/dL 1.80  1.70  1.7      Sodium 135 - 145 mmol/L  141  141      Potassium 3.5 - 5.1 mmol/L  4.1  4.6      Chloride 98 - 111 mmol/L  112  105      CO2 22 - 32 mmol/L  20  23      Calcium 8.9 - 10.3 mg/dL  8.6  9.5      Alkaline Phos 25 - 125   149      AST 14 - 40   33      ALT 10 - 40   44         This result is from an external source.     No results found for: "CEA1", "CEA" / No results found for: "CEA1", "CEA" Lab Results  Component Value Date   PSA1 1.8 12/28/2022   No results found for: "[id number]" No results found for: "CAN125"  No results found for: "TOTALPROTELP", "ALBUMINELP", "A1GS", "A2GS", "BETS", "BETA2SER", "GAMS", "MSPIKE", "SPEI" Lab Results  Component Value Date   TIBC 235 (L) 03/16/2020   FERRITIN 126 03/16/2020   IRONPCTSAT 24 03/16/2020   No results found for: "LDH"   STUDIES:   NM PET (PSMA) SKULL TO MID THIGH  Result Date: 12/15/2022 CLINICAL DATA:  Prostate carcinoma with biochemical recurrence. Postradiation. EXAM: NUCLEAR MEDICINE PET SKULL BASE TO THIGH TECHNIQUE: 8.4 mCi F18 Piflufolastat (Pylarify) was injected intravenously. Full-ring PET imaging was performed from the skull base to thigh after the radiotracer. CT data was obtained and used for attenuation correction and anatomic  localization. COMPARISON:  PET-CT June 08, 2017 and CT abdomen pelvis Mar 25, 2022. FINDINGS: NECK No radiotracer activity in neck lymph nodes. Incidental CT finding: None. CHEST No radiotracer accumulation within mediastinal or hilar lymph nodes. No suspicious pulmonary nodules on the CT scan. Incidental CT finding: Coronary artery calcifications. Aortic calcifications. Bilateral reticular opacities with clustered micronodularity. Gynecomastia. ABDOMEN/PELVIS Prostate: Marked radiotracer uptake within the prostate gland with a max SUV of 17.8. Lymph nodes: No definite abnormal radiotracer accumulation within pelvic or abdominal nodes. Liver: No evidence of liver metastasis. Incidental CT finding: Colonic diverticulosis. Symmetric bladder wall thickening. Cholelithiasis without findings of acute cholecystitis. SKELETON No focal activity to suggest skeletal metastasis. Left AKA and right BKA. Diffuse muscular fatty atrophy most prominent in the paraspinal muscles, gluteal muscles and muscles of the bilateral thighs. IMPRESSION: 1. Marked radiotracer uptake within the prostate gland compatible with residual/recurrent prostate cancer. 2. No convincing evidence of radiotracer avid nodal or distant metastatic disease. 3. Bilateral reticular opacities with clustered micronodularity, likely infectious or inflammatory. Suggest further evaluation with nonemergent ILD protocol CT with inspiratory and expiratory views. 4. Cholelithiasis without findings of acute cholecystitis. 5. Colonic diverticulosis without findings of acute diverticulitis. 6. Diffuse muscular fatty atrophy most prominent in the paraspinal muscles, gluteal muscles and muscles of the bilateral thighs. 7.  Aortic Atherosclerosis (ICD10-I70.0). Electronically Signed   By: Dahlia Bailiff M.D.   On: 12/15/2022 15:46

## 2023-01-04 NOTE — Patient Instructions (Addendum)
[facility]  Discharge Instructions  You were seen and examined today by Dr. Delton Coombes. Dr. Delton Coombes is a medical oncologist, meaning that he specializes in the treatment of cancer diagnoses. Dr. Delton Coombes discussed your past medical history, family history of cancers, and the events that led to you being here today.  You were referred to Dr. Delton Coombes for ongoing management of your prostate cancer. Your PSA has doubled in a matter of 3 months which is concerning. The PET scan does not show any spread of the cancer beyond the prostate.  What this means is that the Lupron injection you are getting every 6 months is not working by itself anymore. Dr. Delton Coombes has recommended additional treatment in the form of a pill.  Dr. Delton Coombes has recommended starting Nubeqa, which is taken as '600mg'$  twice daily. It works with the [place] to help control your prostate cancer.  The most common side effects of Nubeqa are hot flashes and fatigue. Rarely, you could experience nausea, diarrhea or constipation, skin rashes, joint aches - these do not commonly occur.  Follow-up as scheduled.  Thank you for choosing [facility] to provide your oncology and hematology care.   To afford each patient quality time with our provider, please arrive at least 15 minutes before your scheduled appointment time. You may need to reschedule your appointment if you arrive late (10 or more minutes). Arriving late affects you and other patients whose appointments are after yours.  Also, if you miss three or more appointments without notifying the office, you may be dismissed from the clinic at the provider's discretion.    Again, thank you for choosing [facility].  Our hope is that these requests will decrease the amount of time that you wait before being seen by our physicians.   If you have a lab appointment with the [facility] please come in thru the  Main Entrance and check in at the main information desk.           _____________________________________________________________  Should you have questions after your visit to [facility], please contact our office at [phone] and follow the prompts.  Our office hours are 8:00 a.m. to 4:30 p.m. Monday - Thursday and 8:00 a.m. to 2:30 p.m. Friday.  Please note that voicemails left after 4:00 p.m. may not be returned until the following business day.  We are closed weekends and all major holidays.  You do have access to a nurse 24-7, just call the main number to the clinic [phone] and do not press any options, hold on the line and a nurse will answer the phone.    For prescription refill requests, have your pharmacy contact our office and allow 72 hours.    Masks are optional in the cancer centers. If you would like for your care team to wear a mask while they are taking care of you, please let them know. You may have one support person who is at least 71 years old accompany you for your appointments.

## 2023-01-05 ENCOUNTER — Other Ambulatory Visit (HOSPITAL_COMMUNITY): Payer: Self-pay

## 2023-01-05 ENCOUNTER — Telehealth: Payer: Self-pay

## 2023-01-05 NOTE — Telephone Encounter (Signed)
Oral Oncology Patient Advocate Encounter  Reached out and spoke with patient regarding PAP paperwork, explained that I would send it to their preferred email via DocuSign.   Confirmed email address: [email or web address].    Patient expressed understanding and consent.  Will follow up once paperwork has been signed and returned.   [name], [facility] Patient [place]  [phone] (phone) [phone] (fax) 01/05/2023 3:22 PM

## 2023-01-05 NOTE — Telephone Encounter (Signed)
Oral Oncology Patient Advocate Encounter   Began application for assistance for Nubeqa through [employer] [place] [employer].   Application will be submitted upon completion of necessary supporting documentation.   [employer]'s phone number [phone].   I will continue to check the status until final determination.    [name], [facility] Patient [place]  [phone] (phone) [phone] (fax) 01/05/2023 3:22 PM

## 2023-01-05 NOTE — Telephone Encounter (Signed)
Oral Oncology Patient Advocate Encounter  Prior Authorization for [name] has been approved.    PA# [id number]  Effective dates: 12/22/22 through 10/29/2098 (until further notice)  Patients co-pay is $3,330.94.    [name], [facility] Patient [place]  [phone] (phone) [phone] (fax) 01/05/2023 9:21 AM

## 2023-01-05 NOTE — Telephone Encounter (Signed)
Oral Oncology Patient Advocate Encounter  New authorization   Received notification that prior authorization for Nubeqa is required.   PA submitted on 01/05/23  Key [id number]  Status is pending     [name], [name]  [phone] (phone) [phone] (fax) 01/05/2023 8:04 AM

## 2023-01-08 NOTE — Telephone Encounter (Signed)
Received back patient signatures. I will submit for processing once I receive back MD signatures.    [name], [facility] Patient [place]  [phone] (phone) [phone] (fax) 01/08/2023 8:08 AM

## 2023-01-09 NOTE — Telephone Encounter (Signed)
Oral Oncology Patient Advocate Encounter   Submitted application for assistance for Nubeqa to [employer] [place] [employer].   Application submitted via e-fax to [phone]   [employer]'s phone number [phone].   I will continue to check the status until final determination.    [name], [facility] Patient [place]  [phone] (phone) [phone] (fax) 01/09/2023 12:23 PM

## 2023-01-12 DIAGNOSIS — E782 Mixed hyperlipidemia: Secondary | ICD-10-CM | POA: Diagnosis not present

## 2023-01-12 DIAGNOSIS — E1122 Type 2 diabetes mellitus with diabetic chronic kidney disease: Secondary | ICD-10-CM | POA: Diagnosis not present

## 2023-01-15 NOTE — Telephone Encounter (Signed)
Patient knows to call me at [phone] with any questions, concerns, or issues regarding receiving Nubeqa. Patient has already set delivery of medication for Tuesday 01/16/23.   [name], [facility] Patient [place]  [phone] (phone) [phone] (fax) 01/15/2023 10:26 AM

## 2023-01-15 NOTE — Telephone Encounter (Signed)
Oral Oncology Patient Advocate Encounter   Received notification that the application for assistance for Nubeqa through [employer] [place] [employer] has been approved.   [employer]'s phone number [phone].   Effective dates: 01/15/23 through 10/30/23  I have spoken to the patient.   [name], [facility] Patient [place]  [phone] (phone) [phone] (fax) 01/15/2023 9:59 AM

## 2023-01-15 NOTE — Telephone Encounter (Signed)
Patient approved for mfg assistance and has delivery set for Tuesday 01/16/23.    [name], [facility] Patient [place]  [phone] (phone) [phone] (fax) 01/15/2023 10:27 AM

## 2023-01-16 NOTE — Telephone Encounter (Signed)
Oral Chemotherapy Pharmacist Encounter  Mr. Ishler is expecting his Nubeqa to be delivered today 01/16/23.  Patient Education I spoke with patient for overview of new oral chemotherapy medication:Nubeqa (darolutamide) for the treatment of non-metastatic castration resistant prostate in conjunction with ADT, planned duration until disease progression or unacceptable drug toxicity   Counseled patient on administration, dosing, side effects, monitoring, drug-food interactions, safe handling, storage, and disposal. Patient will take 2 tablets (600 mg total) by mouth 2 (two) times daily with a meal.  Side effects include but not limited to: decreased wbc and changes in liver function.    Reviewed with patient importance of keeping a medication schedule and plan for any missed doses.  After discussion with patient no patient barriers to medication adherence identified.   Mr. Roerig voiced understanding and appreciation. All questions answered. Medication handout provided.  Provided patient with Oral [facility] phone number. Patient knows to call the office with questions or concerns. Oral Chemotherapy Navigation Clinic will continue to follow.  Darl Pikes, PharmD, BCPS, BCOP, CPP Hematology/Oncology Clinical Pharmacist Practitioner [place] [phone]  01/16/2023 10:54 AM

## 2023-01-18 DIAGNOSIS — N3281 Overactive bladder: Secondary | ICD-10-CM | POA: Diagnosis not present

## 2023-01-18 DIAGNOSIS — E1122 Type 2 diabetes mellitus with diabetic chronic kidney disease: Secondary | ICD-10-CM | POA: Diagnosis not present

## 2023-01-18 DIAGNOSIS — N1831 Chronic kidney disease, stage 3a: Secondary | ICD-10-CM | POA: Diagnosis not present

## 2023-01-18 DIAGNOSIS — C61 Malignant neoplasm of prostate: Secondary | ICD-10-CM | POA: Diagnosis not present

## 2023-01-18 DIAGNOSIS — H409 Unspecified glaucoma: Secondary | ICD-10-CM | POA: Diagnosis not present

## 2023-01-18 DIAGNOSIS — E782 Mixed hyperlipidemia: Secondary | ICD-10-CM | POA: Diagnosis not present

## 2023-01-18 DIAGNOSIS — E113291 Type 2 diabetes mellitus with mild nonproliferative diabetic retinopathy without macular edema, right eye: Secondary | ICD-10-CM | POA: Diagnosis not present

## 2023-01-18 DIAGNOSIS — I2510 Atherosclerotic heart disease of native coronary artery without angina pectoris: Secondary | ICD-10-CM | POA: Diagnosis not present

## 2023-01-18 DIAGNOSIS — I129 Hypertensive chronic kidney disease with stage 1 through stage 4 chronic kidney disease, or unspecified chronic kidney disease: Secondary | ICD-10-CM | POA: Diagnosis not present

## 2023-01-18 DIAGNOSIS — D631 Anemia in chronic kidney disease: Secondary | ICD-10-CM | POA: Diagnosis not present

## 2023-01-18 DIAGNOSIS — G4733 Obstructive sleep apnea (adult) (pediatric): Secondary | ICD-10-CM | POA: Diagnosis not present

## 2023-01-18 DIAGNOSIS — K625 Hemorrhage of anus and rectum: Secondary | ICD-10-CM | POA: Diagnosis not present

## 2023-01-19 ENCOUNTER — Telehealth: Payer: Self-pay

## 2023-01-19 NOTE — Telephone Encounter (Signed)
Return call to patient. Patient states he was following up on PET scan and get a copy of his records for his cancer insurance. Made patient aware that he can access his PET scan record on his my chart and his oncologist will also have those medical records. Patient voiced understanding

## 2023-01-25 ENCOUNTER — Inpatient Hospital Stay (HOSPITAL_BASED_OUTPATIENT_CLINIC_OR_DEPARTMENT_OTHER): Payer: Medicare Other | Admitting: Hematology

## 2023-01-25 ENCOUNTER — Inpatient Hospital Stay: Payer: Medicare Other

## 2023-01-25 VITALS — BP 110/74 | HR 70 | Temp 98.3°F | Resp 16

## 2023-01-25 DIAGNOSIS — Z09 Encounter for follow-up examination after completed treatment for conditions other than malignant neoplasm: Secondary | ICD-10-CM | POA: Diagnosis not present

## 2023-01-25 DIAGNOSIS — C61 Malignant neoplasm of prostate: Secondary | ICD-10-CM

## 2023-01-25 DIAGNOSIS — M810 Age-related osteoporosis without current pathological fracture: Secondary | ICD-10-CM

## 2023-01-25 DIAGNOSIS — N189 Chronic kidney disease, unspecified: Secondary | ICD-10-CM | POA: Diagnosis not present

## 2023-01-25 DIAGNOSIS — Z7902 Long term (current) use of antithrombotics/antiplatelets: Secondary | ICD-10-CM | POA: Diagnosis not present

## 2023-01-25 DIAGNOSIS — Z923 Personal history of irradiation: Secondary | ICD-10-CM | POA: Diagnosis not present

## 2023-01-25 DIAGNOSIS — I129 Hypertensive chronic kidney disease with stage 1 through stage 4 chronic kidney disease, or unspecified chronic kidney disease: Secondary | ICD-10-CM | POA: Diagnosis not present

## 2023-01-25 DIAGNOSIS — E1122 Type 2 diabetes mellitus with diabetic chronic kidney disease: Secondary | ICD-10-CM | POA: Diagnosis not present

## 2023-01-25 LAB — CBC WITH DIFFERENTIAL/PLATELET
Abs Immature Granulocytes: 0.02 10*3/uL (ref 0.00–0.07)
Basophils Absolute: 0 10*3/uL (ref 0.0–0.1)
Basophils Relative: 1 %
Eosinophils Absolute: 0.2 10*3/uL (ref 0.0–0.5)
Eosinophils Relative: 3 %
HCT: 34.6 % — ABNORMAL LOW (ref 39.0–52.0)
Hemoglobin: 11.4 g/dL — ABNORMAL LOW (ref 13.0–17.0)
Immature Granulocytes: 0 %
Lymphocytes Relative: 20 %
Lymphs Abs: 1.2 10*3/uL (ref 0.7–4.0)
MCH: 30 pg (ref 26.0–34.0)
MCHC: 32.9 g/dL (ref 30.0–36.0)
MCV: 91.1 fL (ref 80.0–100.0)
Monocytes Absolute: 0.5 10*3/uL (ref 0.1–1.0)
Monocytes Relative: 8 %
Neutro Abs: 4 10*3/uL (ref 1.7–7.7)
Neutrophils Relative %: 68 %
Platelets: 218 10*3/uL (ref 150–400)
RBC: 3.8 MIL/uL — ABNORMAL LOW (ref 4.22–5.81)
RDW: 14.4 % (ref 11.5–15.5)
WBC: 5.9 10*3/uL (ref 4.0–10.5)
nRBC: 0 % (ref 0.0–0.2)

## 2023-01-25 LAB — COMPREHENSIVE METABOLIC PANEL
ALT: 33 U/L (ref 0–44)
AST: 31 U/L (ref 15–41)
Albumin: 2.8 g/dL — ABNORMAL LOW (ref 3.5–5.0)
Alkaline Phosphatase: 109 U/L (ref 38–126)
Anion gap: 10 (ref 5–15)
BUN: 30 mg/dL — ABNORMAL HIGH (ref 8–23)
CO2: 21 mmol/L — ABNORMAL LOW (ref 22–32)
Calcium: 8.4 mg/dL — ABNORMAL LOW (ref 8.9–10.3)
Chloride: 104 mmol/L (ref 98–111)
Creatinine, Ser: 1.91 mg/dL — ABNORMAL HIGH (ref 0.61–1.24)
GFR, Estimated: 37 mL/min — ABNORMAL LOW (ref >60)
Glucose, Bld: 144 mg/dL — ABNORMAL HIGH (ref 70–99)
Potassium: 4.2 mmol/L (ref 3.5–5.1)
Sodium: 135 mmol/L (ref 135–145)
Total Bilirubin: 1.1 mg/dL (ref 0.3–1.2)
Total Protein: 6.5 g/dL (ref 6.5–8.1)

## 2023-01-25 LAB — MAGNESIUM: Magnesium: 2 mg/dL (ref 1.7–2.4)

## 2023-01-25 NOTE — Progress Notes (Signed)
[facility] [address],  [address]   Clinic Day:  01/25/2023  Referring physician: Celene Squibb, MD  Patient Care Team: Celene Squibb, MD as PCP - General (Internal Medicine) Edythe Clarity, [facility] as Pharmacist (Pharmacist) Eloise Harman, DO as Consulting Physician (Internal Medicine) Eloise Harman, DO as Consulting Physician (Gastroenterology) Derek Jack, MD as Medical Oncologist (Medical Oncology) Brien Mates, RN as Oncology Nurse Navigator (Medical Oncology)   ASSESSMENT & PLAN:   Assessment:  1.  Castration refractory prostate cancer, T3 N1 M0: - 04/27/2017: Prostatic adenocarcinoma Gleason 4+5=9 involving both lobes, PSA 9.8 - Status post long-term ADT plus definitive EBRT from 08/27/2017 through 10/26/2017 - History of radiation-induced cystitis and proctitis - Patient on Eligard every 6 months since 03/19/2020 - PSA <0.1 (09/01/2021), 0.2 (03/09/2022), 0.6 (07/05/2022), 0.9 (09/19/2022), 1.8 (2/29/2022) - PSMA PET (12/14/2022): Marked uptake in the prostate gland compatible with residual/recurrent prostate cancer.  No evidence of nodal or distant metastatic disease.  No focal bone mets. - Darolutamide 600 mg twice daily started on 01/16/2023.  2.  Social/family history: - Lives at home with his wife.  Independent of ADLs and IADLs.  He had right BKA and left AKA from diabetes and has prosthesis.  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  He is a non-smoker. - Father had lung cancer.  Maternal grand father had prostate cancer.  Sister had breast cancer.  Plan:  1.  M0 Castrate resistant prostate cancer: - Darolutamide was started on 01/16/2023. - He developed cramps on the sides of his abdomen since he started darolutamide.  However cramps have improved since he started taking darolutamide with food. - Reviewed labs today which showed normal LFTs.  Albumin is low at 2.8.  Creatinine is 1.91 and more or  less stable. - Recommend continuing darolutamide 600 mg twice daily at this time. - Will follow-up on genetics and blood based NGS testing results. - RTC 2 weeks for follow-up.  2.  Bone health: - We will order DEXA scan prior to next visit.  3.  Normocytic anemia: - Combination anemia from functional iron deficiency and CKD. - Hemoglobin is 11.4 today.  Will check ferritin and iron panel at next visit.   No orders of the defined types were placed in this encounter.     I,Alexis Herring,acting as a Education administrator for [employer], MD.,have documented all relevant documentation on the behalf of Derek Jack, MD,as directed by  Derek Jack, MD while in the presence of Derek Jack, MD.  I, Derek Jack MD, have reviewed the above documentation for accuracy and completeness, and I agree with the above.   Derek Jack, MD   3/28/202412:36 PM  CHIEF COMPLAINT/PURPOSE OF CONSULT:   Diagnosis: prostate cancer   Cancer Staging  Prostate cancer [facility]) Staging form: Prostate, AJCC 8th Edition - Clinical: Stage IVA (cT1c, cN1, cM0, PSA: 9.8, Grade Group: 5) - Signed by Tyler Pita, MD on 06/09/2017    Prior Therapy: radiation therapy 10/29-12/28/2018  Current Therapy:  Leurpolide injections and Darolutamide  HISTORY OF PRESENT ILLNESS:   Oncology History   No history exists.     [name] is a 72 y.o. male presenting to clinic today for follow up of prostate cancer. He was last seen by me on 01/04/23 for consult.  Today, he states that he is doing well overall. He was having bilateral loin cramping pain but this resolved once he started  taking the Darolutamide with meals rather than without food as he was doing previously.  His appetite level is at 100%. His energy level is at 70%. He denies any fevers, hot flashes, night sweats, or N/V/D.  PAST MEDICAL HISTORY:   Past Medical History: Past Medical History:  Diagnosis Date   Arthritis     Cerebrovascular disease    MRI shows Right carotid inferior cavernours narrowing 75% and  50-75% stenosis of Cavernous and supraclinoi right side   CKD (chronic kidney disease) 06/28/2014   Sees Dr Florene Glen   Diabetic retinopathy [facility])    Hyperlipidemia    Hypertension    Myocardial infarction [facility])    "mild" heart attack   Necrosis ([place])    #2 nail    Pneumonia    as a child   Poor circulation of extremity    Prostate cancer ([place]) 2017   Prostate   Sleep apnea    uses cpap, getting a new one   Type 2 Diabetes mellitus    Type 2    Surgical History: Past Surgical History:  Procedure Laterality Date   ABOVE KNEE LEG AMPUTATION Left 2004   started out below knee and then extended to above knee due to poor healing   AMPUTATION Right 04/28/2015   Procedure: AMPUTATION RAY, RIGHT 5TH TOE;  Surgeon: Marybelle Killings, MD;  Location: [place];  Service: Orthopedics;  Laterality: Right;   AMPUTATION Right 05/10/2015   Procedure: Right Below Knee Amputation;  Surgeon: Marybelle Killings, MD;  Location: [place];  Service: Orthopedics;  Laterality: Right;   CATARACT EXTRACTION W/PHACO  09/05/2012   Procedure: CATARACT EXTRACTION PHACO AND INTRAOCULAR LENS PLACEMENT (IOC);  Surgeon: Tonny Branch, MD;  Location: [facility];  Service: Ophthalmology;  Laterality: Left;  CDE=5.45   CATARACT EXTRACTION W/PHACO  10/03/2012   Procedure: CATARACT EXTRACTION PHACO AND INTRAOCULAR LENS PLACEMENT (IOC);  Surgeon: Tonny Branch, MD;  Location: [facility];  Service: Ophthalmology;  Laterality: Right;  CDE: 12.31   COLONOSCOPY  2011   Dr. Deatra Ina: colon polyps, tubular adenoma   COLONOSCOPY N/A 11/01/2018   Dr. Gala Romney: Blood noted in the rectal vault.  Vascular pattern of the rectum abnormal, neovascular changes distally and actively oozing.  There were 3 2 to 5 mm polyps in the splenic flexure, cecum removed.  Cecal polyp was not recovered.  Radiation proctitis status post APC treatment.  The splenic flexure polyps were tubular  adenomas.  Next colonoscopy in 5 years.   FLEXIBLE SIGMOIDOSCOPY N/A 10/27/2019   Procedure: FLEXIBLE SIGMOIDOSCOPY;  Surgeon: Danie Binder, MD; rectal bleeding due to radiation proctitis s/p APC therapy (actively bleeding during exam), rectosigmoid colon and sigmoid colon appeared normal.   FLEXIBLE SIGMOIDOSCOPY N/A 03/13/2022   Procedure: FLEXIBLE SIGMOIDOSCOPY;  Surgeon: Eloise Harman, DO;  [facility];  Service: Endoscopy;  Laterality: N/A;  1:45pm   HOT HEMOSTASIS  03/13/2022   Procedure: HOT HEMOSTASIS (ARGON PLASMA COAGULATION/BICAP);  Surgeon: Eloise Harman, DO;  [facility];  Service: Endoscopy;;   POLYPECTOMY  11/01/2018   Procedure: POLYPECTOMY;  Surgeon: Daneil Dolin, MD;  [facility];  Service: Endoscopy;;  cecum,splenic flexure   REFRACTIVE SURGERY Left 08/01/2021   Cleaned cataract    Social History: Social History   Socioeconomic History   Marital status: Married    Spouse name: Not on file   Number of children: 2   Years of education: college   Highest education level: Not on  file  Occupational History   Occupation: Mining engineer: [employer]  Tobacco Use   Smoking status: Never    Passive exposure: Never   Smokeless tobacco: Never  Vaping Use   Vaping Use: Never used  Substance and Sexual Activity   Alcohol use: No   Drug use: No   Sexual activity: Not Currently  Other Topics Concern   Not on file  Social History Narrative   Patient lives with his wife    Patient right handed   Patient drinks caffine on occ.   Social Determinants of Health   Financial Resource Strain: Not on file  Food Insecurity: No Food Insecurity (01/04/2023)   Hunger Vital Sign    Worried About Running Out of Food in the Last Year: Never true    Ran Out of Food in the Last Year: Never true  Transportation Needs: No Transportation Needs (01/04/2023)   PRAPARE - Hydrologist (Medical): No     Lack of Transportation (Non-Medical): No  Physical Activity: Not on file  Stress: Not on file  Social Connections: Not on file  Intimate Partner Violence: Not At Risk (01/04/2023)   Humiliation, Afraid, Rape, and Kick questionnaire    Fear of Current or Ex-Partner: No    Emotionally Abused: No    Physically Abused: No    Sexually Abused: No    Family History: Family History  Problem Relation Age of Onset   Diabetes Mother    Hypertension Mother    Cancer Father        lung   Cancer Sister        breast   Sickle cell anemia Daughter    Cancer Maternal Grandfather        prostate   Colon cancer Neg Hx     Current Medications:  Current Outpatient Medications:    acetaminophen (TYLENOL) 325 MG tablet, Take 650 mg by mouth daily as needed for mild pain, moderate pain or headache., Disp: , Rfl:    atenolol (TENORMIN) 25 MG tablet, TAKE 1 TABLET BY MOUTH EVERY DAY, Disp: 90 tablet, Rfl: 1   atorvastatin (LIPITOR) 80 MG tablet, TAKE 1 TABLET BY MOUTH EVERY DAY, Disp: 90 tablet, Rfl: 1   B-D UF III MINI PEN NEEDLES 31G X 5 MM MISC, USE FOUR TIMES DAILY AS DIRECTED, Disp: 150 each, Rfl: 2   Calcium Carb-Cholecalciferol 600-500 MG-UNIT CAPS, Take 2 capsules by mouth daily. , Disp: , Rfl:    cephALEXin (KEFLEX) 500 MG capsule, Take 1 capsule (500 mg total) by mouth 3 (three) times daily., Disp: 21 capsule, Rfl: 0   clopidogrel (PLAVIX) 75 MG tablet, TAKE 1 TABLET BY MOUTH EVERY DAY, Disp: 90 tablet, Rfl: 1   CONTOUR TEST test strip, TEST TWICE DAILY E11.65, Disp: 200 each, Rfl: 2   darolutamide (NUBEQA) 300 MG tablet, Take 2 tablets (600 mg total) by mouth 2 (two) times daily with a meal., Disp: 120 tablet, Rfl: 1   furosemide (LASIX) 20 MG tablet, Take 20 mg by mouth 3 (three) times a week., Disp: , Rfl:    glucose blood test strip, 1 each by Other route 2 (two) times daily. Use as instructed bid. E11.65 Contour next, Disp: 300 each, Rfl: 2   insulin degludec (TRESIBA FLEXTOUCH) 100  UNIT/ML SOPN FlexTouch Pen, Inject 30 Units into the skin at bedtime., Disp: , Rfl:    latanoprost (XALATAN) 0.005 % ophthalmic solution, Place 1 drop into  both eyes at bedtime., Disp: , Rfl:    prochlorperazine (COMPAZINE) 10 MG tablet, Take 1 tablet (10 mg total) by mouth every 6 (six) hours as needed for nausea or vomiting., Disp: 30 tablet, Rfl: 1   Semaglutide, 1 MG/DOSE, 4 MG/3ML SOPN, Inject 1 mg as directed once a week., Disp: 6 mL, Rfl: 3   Vibegron (GEMTESA) 75 MG TABS, Take 1 tablet (75 mg total) by mouth daily., Disp: 42 tablet, Rfl: 0   Allergies: Allergies  Allergen Reactions   Zolpidem Tartrate Other (See Comments)    disorientation     REVIEW OF SYSTEMS:   Review of Systems  Constitutional:  Positive for fatigue. Negative for chills and fever.  HENT:   Negative for lump/mass, mouth sores, nosebleeds, sore throat and trouble swallowing.   Eyes:  Negative for eye problems.  Respiratory:  Positive for cough. Negative for shortness of breath.   Cardiovascular:  Negative for chest pain, leg swelling and palpitations.  Gastrointestinal:  Negative for abdominal pain, constipation, diarrhea, nausea and vomiting.  Genitourinary:  Negative for bladder incontinence, difficulty urinating, dysuria, frequency, hematuria and nocturia.   Musculoskeletal:  Positive for myalgias (cramps). Negative for arthralgias, back pain, flank pain and neck pain.  Skin:  Negative for itching and rash.  Neurological:  Negative for dizziness, headaches and numbness.  Hematological:  Does not bruise/bleed easily.  Psychiatric/Behavioral:  Positive for sleep disturbance. Negative for depression and suicidal ideas. The patient is not nervous/anxious.   All other systems reviewed and are negative.    VITALS:   Blood pressure 110/74, pulse 70, temperature 98.3 F (36.8 C), temperature source Oral, resp. rate 16, SpO2 100 %.  Wt Readings from Last 3 Encounters:  01/04/23 250 lb 14.1 oz (113.8 kg)   12/28/22 254 lb (115.2 kg)  12/27/22 254 lb 9.6 oz (115.5 kg)    There is no height or weight on file to calculate BMI.  Performance status (ECOG): 1 - Symptomatic but completely ambulatory  PHYSICAL EXAM:   Physical Exam Vitals and nursing note reviewed. Exam conducted with a chaperone present.  Constitutional:      Appearance: Normal appearance.  Cardiovascular:     Rate and Rhythm: Normal rate and regular rhythm.     Pulses: Normal pulses.     Heart sounds: Normal heart sounds.  Pulmonary:     Effort: Pulmonary effort is normal.     Breath sounds: Normal breath sounds.  Abdominal:     Palpations: Abdomen is soft. There is no hepatomegaly, splenomegaly or mass.     Tenderness: There is no abdominal tenderness.  Musculoskeletal:     Right lower leg: No edema.     Left lower leg: No edema.  Lymphadenopathy:     Cervical: No cervical adenopathy.     Right cervical: No superficial, deep or posterior cervical adenopathy.    Left cervical: No superficial, deep or posterior cervical adenopathy.     Upper Body:     Right upper body: No supraclavicular or axillary adenopathy.     Left upper body: No supraclavicular or axillary adenopathy.  Neurological:     General: No focal deficit present.     Mental Status: He is alert and oriented to person, place, and time.  Psychiatric:        Mood and Affect: Mood normal.        Behavior: Behavior normal.     LABS:      Latest Ref Rng & Units 01/25/2023  10:07 AM 03/09/2022    1:04 PM 09/01/2021   12:00 AM  CBC  WBC 4.0 - 10.5 K/uL 5.9  5.9  6.0      Hemoglobin 13.0 - 17.0 g/dL 11.4  11.0  12.2      Hematocrit 39.0 - 52.0 % 34.6  33.1  37      Platelets 150 - 400 K/uL 218  234  205         This result is from an external source.      Latest Ref Rng & Units 01/25/2023   10:07 AM 03/25/2022    2:25 PM 03/09/2022    1:04 PM  CMP  Glucose 70 - 99 mg/dL 144   120   BUN 8 - 23 mg/dL 30   31   Creatinine 0.61 - 1.24 mg/dL 1.91   1.80  1.70   Sodium 135 - 145 mmol/L 135   141   Potassium 3.5 - 5.1 mmol/L 4.2   4.1   Chloride 98 - 111 mmol/L 104   112   CO2 22 - 32 mmol/L 21   20   Calcium 8.9 - 10.3 mg/dL 8.4   8.6   Total Protein 6.5 - 8.1 g/dL 6.5     Total Bilirubin 0.3 - 1.2 mg/dL 1.1     Alkaline Phos 38 - 126 U/L 109     AST 15 - 41 U/L 31     ALT 0 - 44 U/L 33        No results found for: "CEA1", "CEA" / No results found for: "CEA1", "CEA" Lab Results  Component Value Date   PSA1 1.8 12/28/2022   No results found for: "[id number]" No results found for: "CAN125"  No results found for: "TOTALPROTELP", "ALBUMINELP", "A1GS", "A2GS", "BETS", "BETA2SER", "GAMS", "MSPIKE", "SPEI" Lab Results  Component Value Date   TIBC 235 (L) 03/16/2020   FERRITIN 126 03/16/2020   IRONPCTSAT 24 03/16/2020   No results found for: "LDH"   STUDIES:   No results found.

## 2023-01-25 NOTE — Patient Instructions (Addendum)
[place] at [facility] Discharge Instructions   You were seen and examined today by Dr. Delton Coombes.  He reviewed the results of your lab work which are normal/stable.   Continue Nubeqa as prescribed.   We will obtain a bone density test to see if you would benefit from bone strengthening agents since the treatment for the prostate cancer can weaken the bone.   We will see you back in 2 weeks. We will repeat your lab work at that time.   Return as scheduled.    Thank you for choosing [place] at [facility] to provide your oncology and hematology care.  To afford each patient quality time with our provider, please arrive at least 15 minutes before your scheduled appointment time.   If you have a lab appointment with the [facility] please come in thru the Main Entrance and check in at the main information desk.  You need to re-schedule your appointment should you arrive 10 or more minutes late.  We strive to give you quality time with our providers, and arriving late affects you and other patients whose appointments are after yours.  Also, if you no show three or more times for appointments you may be dismissed from the clinic at the providers discretion.     Again, thank you for choosing [facility].  Our hope is that these requests will decrease the amount of time that you wait before being seen by our physicians.       _____________________________________________________________  Should you have questions after your visit to [facility], please contact our office at [phone] and follow the prompts.  Our office hours are 8:00 a.m. and 4:30 p.m. Monday - Friday.  Please note that voicemails left after 4:00 p.m. may not be returned until the following business day.  We are closed weekends and major holidays.  You do have access to a nurse 24-7, just call the main number to the clinic [phone] and do not  press any options, hold on the line and a nurse will answer the phone.    For prescription refill requests, have your pharmacy contact our office and allow 72 hours.    Due to Covid, you will need to wear a mask upon entering the hospital. If you do not have a mask, a mask will be given to you at the Main Entrance upon arrival. For doctor visits, patients may have 1 support person age 13 or older with them. For treatment visits, patients can not have anyone with them due to social distancing guidelines and our immunocompromised population.

## 2023-02-07 ENCOUNTER — Other Ambulatory Visit: Payer: Self-pay

## 2023-02-07 DIAGNOSIS — C61 Malignant neoplasm of prostate: Secondary | ICD-10-CM

## 2023-02-07 NOTE — Progress Notes (Signed)
[facility] [address] [place], [place] [address]    Clinic Day:  02/08/2023  Referring physician: Benita StabileHall, John Z, MD  Patient Care Team: Benita StabileHall, John Z, MD as PCP - General (Internal Medicine) Erroll Lunaavis, Christian L, [facility] LPRPH as Pharmacist (Pharmacist) Lanelle Balarver, Charles K, DO as Consulting Physician (Internal Medicine) Lanelle Balarver, Charles K, DO as Consulting Physician (Gastroenterology) Doreatha MassedKatragadda, Rynell Ciotti, MD as Medical Oncologist (Medical Oncology) Therese SarahEdwards, Morgan P, RN as Oncology Nurse Navigator (Medical Oncology)   ASSESSMENT & PLAN:   Assessment: 1.  Castration refractory prostate cancer, T3 N1 M0: - 04/27/2017: Prostatic adenocarcinoma Gleason 4+5=9 involving both lobes, PSA 9.8 - Status post long-term ADT plus definitive EBRT from 08/27/2017 through 10/26/2017 - History of radiation-induced cystitis and proctitis - Patient on Eligard every 6 months since 03/19/2020 - PSA <0.1 (09/01/2021), 0.2 (03/09/2022), 0.6 (07/05/2022), 0.9 (09/19/2022), 1.8 (2/29/2022) - PSMA PET (12/14/2022): Marked uptake in the prostate gland compatible with residual/recurrent prostate cancer.  No evidence of nodal or distant metastatic disease.  No focal bone mets. - Darolutamide 600 mg twice daily started on 01/16/2023.   2.  Social/family history: - Lives at home with his wife.  Independent of ADLs and IADLs.  He had right BKA and left AKA from diabetes and has prosthesis.  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  He is a non-smoker. - Father had lung cancer.  Maternal grand father had prostate cancer.  Sister had breast cancer.   Plan: 1.  M0 Castrate resistant prostate cancer: - Darolutamide was started on 01/16/2023. - He developed cramps on the sides of his chest wall and abdomen when he initially took it which improved after he started taking it with food.  He has these cramps when he goes to bed. - Labs today: Normal LFTs with albumin 2.9.  Creatinine is 2.24, slightly  elevated from 1.91.  I have recommended that he increase his water intake.  CBC was grossly normal. - We have sent genetic testing today. - [facility] blood based NGS testing: Did not show any targetable mutations.  We discussed the report in detail. - Continue darolutamide 600 mg twice daily.  Will follow-up on genetic test results. - RTC 2 weeks for follow-up with labs.   2.  Bone health: - DEXA scan was done at [facility] on 06/22/2022.  We will obtain reports of the scan.   3.  Normocytic anemia: - Combination anemia from functional iron deficiency and CKD. - Hemoglobin is 11.3.  Will check ferritin and iron panel at next visit.  Orders Placed This Encounter  Procedures   CBC with Differential    Standing Status:   Future    Standing Expiration Date:   02/08/2024   Comprehensive metabolic panel    Standing Status:   Future    Standing Expiration Date:   02/08/2024   Magnesium    Standing Status:   Future    Standing Expiration Date:   02/08/2024   Ferritin    Standing Status:   Future    Standing Expiration Date:   02/08/2024   Iron and TIBC ([facility] ONLY)    Standing Status:   Future    Standing Expiration Date:   02/08/2024      I,Katie Daubenspeck,acting as a scribe for Doreatha MassedSreedhar Paeton Latouche, MD.,have documented all relevant documentation on the behalf of Doreatha MassedSreedhar Tavius Turgeon, MD,as directed by  Doreatha MassedSreedhar Devyne Hauger, MD while in the presence of Doreatha MassedSreedhar Almira Phetteplace, MD.   Dillon BjorkI, Abdelaziz Westenberger  Ellin Saba MD, have reviewed the above documentation for accuracy and completeness, and I agree with the above.   Doreatha Massed, MD   4/11/20244:38 PM  CHIEF COMPLAINT:   Diagnosis: prostate cancer    Cancer Staging  Prostate cancer Staging form: Prostate, AJCC 8th Edition - Clinical: Stage IVA (cT1c, cN1, cM0, PSA: 9.8, Grade Group: 5) - Signed by Margaretmary Dys, MD on 06/09/2017    Prior Therapy: radiation therapy 10/29-12/28/2018   Current Therapy:   Leurpolide injections and Darolutamide    HISTORY OF PRESENT ILLNESS:   Oncology History   No history exists.     INTERVAL HISTORY:   Gerrit is a 72 y.o. male presenting to clinic today for follow up of prostate cancer. He was last seen by me on 01/25/23.  Today, he states that he is doing well overall. His appetite level is at 100%. His energy level is at 70%.  PAST MEDICAL HISTORY:   Past Medical History: Past Medical History:  Diagnosis Date   Arthritis    Cerebrovascular disease    MRI shows Right carotid inferior cavernours narrowing 75% and  50-75% stenosis of Cavernous and supraclinoi right side   CKD (chronic kidney disease) 06/28/2014   Sees Dr Lowell Guitar   Diabetic retinopathy    Hyperlipidemia    Hypertension    Myocardial infarction    "mild" heart attack   Necrosis    #2 nail    Pneumonia    as a child   Poor circulation of extremity    Prostate cancer 2017   Prostate   Sleep apnea    uses cpap, getting a new one   Type 2 Diabetes mellitus    Type 2    Surgical History: Past Surgical History:  Procedure Laterality Date   ABOVE KNEE LEG AMPUTATION Left 2004   started out below knee and then extended to above knee due to poor healing   AMPUTATION Right 04/28/2015   Procedure: AMPUTATION RAY, RIGHT 5TH TOE;  Surgeon: Eldred Manges, MD;  Location: [facility] OR;  Service: Orthopedics;  Laterality: Right;   AMPUTATION Right 05/10/2015   Procedure: Right Below Knee Amputation;  Surgeon: Eldred Manges, MD;  Location: [facility] OR;  Service: Orthopedics;  Laterality: Right;   CATARACT EXTRACTION W/PHACO  09/05/2012   Procedure: CATARACT EXTRACTION PHACO AND INTRAOCULAR LENS PLACEMENT (IOC);  Surgeon: Gemma Payor, MD;  Location: [facility] ORS;  Service: Ophthalmology;  Laterality: Left;  CDE=5.45   CATARACT EXTRACTION W/PHACO  10/03/2012   Procedure: CATARACT EXTRACTION PHACO AND INTRAOCULAR LENS PLACEMENT (IOC);  Surgeon: Gemma Payor, MD;  Location: [facility] ORS;  Service: Ophthalmology;   Laterality: Right;  CDE: 12.31   COLONOSCOPY  2011   Dr. Arlyce Dice: colon polyps, tubular adenoma   COLONOSCOPY N/A 11/01/2018   Dr. Jena Gauss: Blood noted in the rectal vault.  Vascular pattern of the rectum abnormal, neovascular changes distally and actively oozing.  There were 3 2 to 5 mm polyps in the splenic flexure, cecum removed.  Cecal polyp was not recovered.  Radiation proctitis status post APC treatment.  The splenic flexure polyps were tubular adenomas.  Next colonoscopy in 5 years.   FLEXIBLE SIGMOIDOSCOPY N/A 10/27/2019   Procedure: FLEXIBLE SIGMOIDOSCOPY;  Surgeon: West Bali, MD; rectal bleeding due to radiation proctitis s/p APC therapy (actively bleeding during exam), rectosigmoid colon and sigmoid colon appeared normal.   FLEXIBLE SIGMOIDOSCOPY N/A 03/13/2022   Procedure: FLEXIBLE SIGMOIDOSCOPY;  Surgeon: Lanelle Bal, DO;  Location: [facility] END[facility] SUITE;  Service: Endoscopy;  Laterality: N/A;  1:45pm   HOT HEMOSTASIS  03/13/2022   Procedure: HOT HEMOSTASIS (ARGON PLASMA COAGULATION/BICAP);  Surgeon: Lanelle Bal, DO;  Location: [facility];  Service: Endoscopy;;   POLYPECTOMY  11/01/2018   Procedure: POLYPECTOMY;  Surgeon: Corbin Ade, MD;  Location: [facility];  Service: Endoscopy;;  cecum,splenic flexure   REFRACTIVE SURGERY Left 08/01/2021   Cleaned cataract    Social History: Social History   Socioeconomic History   Marital status: Married    Spouse name: Not on file   Number of children: 2   Years of education: college   Highest education level: Not on file  Occupational History   Occupation: Engineer, materials: [employer]  Tobacco Use   Smoking status: Never    Passive exposure: Never   Smokeless tobacco: Never  Vaping Use   Vaping Use: Never used  Substance and Sexual Activity   Alcohol use: No   Drug use: No   Sexual activity: Not Currently  Other Topics Concern   Not on file  Social History Narrative   Patient lives with  his wife    Patient right handed   Patient drinks caffine on occ.   Social Determinants of Health   Financial Resource Strain: Not on file  Food Insecurity: No Food Insecurity (01/04/2023)   Hunger Vital Sign    Worried About Running Out of Food in the Last Year: Never true    Ran Out of Food in the Last Year: Never true  Transportation Needs: No Transportation Needs (01/04/2023)   PRAPARE - Administrator, Civil Service (Medical): No    Lack of Transportation (Non-Medical): No  Physical Activity: Not on file  Stress: Not on file  Social Connections: Not on file  Intimate Partner Violence: Not At Risk (01/04/2023)   Humiliation, Afraid, Rape, and Kick questionnaire    Fear of Current or Ex-Partner: No    Emotionally Abused: No    Physically Abused: No    Sexually Abused: No    Family History: Family History  Problem Relation Age of Onset   Diabetes Mother    Hypertension Mother    Lung cancer Father 61   Breast cancer Sister 89   Prostate cancer Maternal Grandfather    Sickle cell anemia Daughter    Prostate cancer Cousin    Prostate cancer Cousin    Colon cancer Neg Hx     Current Medications:  Current Outpatient Medications:    acetaminophen (TYLENOL) 325 MG tablet, Take 650 mg by mouth daily as needed for mild pain, moderate pain or headache., Disp: , Rfl:    atenolol (TENORMIN) 25 MG tablet, TAKE 1 TABLET BY MOUTH EVERY DAY, Disp: 90 tablet, Rfl: 1   atorvastatin (LIPITOR) 80 MG tablet, TAKE 1 TABLET BY MOUTH EVERY DAY, Disp: 90 tablet, Rfl: 1   B-D UF III MINI PEN NEEDLES 31G X 5 MM MISC, USE FOUR TIMES DAILY AS DIRECTED, Disp: 150 each, Rfl: 2   Calcium Carb-Cholecalciferol 600-500 MG-UNIT CAPS, Take 2 capsules by mouth daily. , Disp: , Rfl:    clopidogrel (PLAVIX) 75 MG tablet, TAKE 1 TABLET BY MOUTH EVERY DAY, Disp: 90 tablet, Rfl: 1   CONTOUR TEST test strip, TEST TWICE DAILY E11.65, Disp: 200 each, Rfl: 2   darolutamide (NUBEQA) 300 MG tablet, Take 2  tablets (600 mg total) by mouth 2 (two) times daily with a meal., Disp: 120 tablet, Rfl:  1   furosemide (LASIX) 20 MG tablet, Take 20 mg by mouth 3 (three) times a week., Disp: , Rfl:    insulin degludec (TRESIBA FLEXTOUCH) 100 UNIT/ML SOPN FlexTouch Pen, Inject 30 Units into the skin at bedtime., Disp: , Rfl:    latanoprost (XALATAN) 0.005 % ophthalmic solution, Place 1 drop into both eyes at bedtime., Disp: , Rfl:    prochlorperazine (COMPAZINE) 10 MG tablet, Take 1 tablet (10 mg total) by mouth every 6 (six) hours as needed for nausea or vomiting., Disp: 30 tablet, Rfl: 1   Semaglutide, 1 MG/DOSE, 4 MG/3ML SOPN, Inject 1 mg as directed once a week., Disp: 6 mL, Rfl: 3   Vibegron (GEMTESA) 75 MG TABS, Take 1 tablet (75 mg total) by mouth daily., Disp: 42 tablet, Rfl: 0   Allergies: Allergies  Allergen Reactions   Zolpidem Tartrate Other (See Comments)    disorientation     REVIEW OF SYSTEMS:   Review of Systems  Constitutional:  Negative for chills, fatigue and fever.  HENT:   Negative for lump/mass, mouth sores, nosebleeds, sore throat and trouble swallowing.   Eyes:  Negative for eye problems.  Respiratory:  Negative for cough and shortness of breath.   Cardiovascular:  Negative for chest pain, leg swelling and palpitations.  Gastrointestinal:  Negative for abdominal pain, constipation, diarrhea, nausea and vomiting.  Genitourinary:  Negative for bladder incontinence, difficulty urinating, dysuria, frequency, hematuria and nocturia.   Musculoskeletal:  Negative for arthralgias, back pain, flank pain, myalgias and neck pain.  Skin:  Negative for itching and rash.  Neurological:  Negative for dizziness, headaches and numbness.  Hematological:  Does not bruise/bleed easily.  Psychiatric/Behavioral:  Positive for sleep disturbance. Negative for depression and suicidal ideas. The patient is not nervous/anxious.   All other systems reviewed and are negative.    VITALS:   Blood  pressure 112/71, pulse 65, temperature 97.8 F (36.6 C), temperature source Oral, resp. rate 16, weight 250 lb 7.1 oz (113.6 kg), SpO2 100 %.  Wt Readings from Last 3 Encounters:  02/08/23 250 lb 7.1 oz (113.6 kg)  01/04/23 250 lb 14.1 oz (113.8 kg)  12/28/22 254 lb (115.2 kg)    Body mass index is 34.93 kg/m.  Performance status (ECOG): 1 - Symptomatic but completely ambulatory  PHYSICAL EXAM:   Physical Exam Vitals and nursing note reviewed. Exam conducted with a chaperone present.  Constitutional:      Appearance: Normal appearance.  Cardiovascular:     Rate and Rhythm: Normal rate and regular rhythm.     Pulses: Normal pulses.     Heart sounds: Normal heart sounds.  Pulmonary:     Effort: Pulmonary effort is normal.     Breath sounds: Normal breath sounds.  Abdominal:     Palpations: Abdomen is soft. There is no hepatomegaly, splenomegaly or mass.     Tenderness: There is no abdominal tenderness.  Musculoskeletal:     Right lower leg: No edema.     Left lower leg: No edema.  Lymphadenopathy:     Cervical: No cervical adenopathy.     Right cervical: No superficial, deep or posterior cervical adenopathy.    Left cervical: No superficial, deep or posterior cervical adenopathy.     Upper Body:     Right upper body: No supraclavicular or axillary adenopathy.     Left upper body: No supraclavicular or axillary adenopathy.  Neurological:     General: No focal deficit present.     Mental  Status: He is alert and oriented to person, place, and time.  Psychiatric:        Mood and Affect: Mood normal.        Behavior: Behavior normal.     LABS:      Latest Ref Rng & Units 02/08/2023    9:45 AM 01/25/2023   10:07 AM 03/09/2022    1:04 PM  CBC  WBC 4.0 - 10.5 K/uL 4.9  5.9  5.9   Hemoglobin 13.0 - 17.0 g/dL 13.0  86.5  78.4   Hematocrit 39.0 - 52.0 % 34.4  34.6  33.1   Platelets 150 - 400 K/uL 238  218  234       Latest Ref Rng & Units 02/08/2023    9:45 AM 01/25/2023    10:07 AM 03/25/2022    2:25 PM  CMP  Glucose 70 - 99 mg/dL 696  295    BUN 8 - 23 mg/dL 35  30    Creatinine 2.84 - 1.24 mg/dL 1.32  4.40  1.02   Sodium 135 - 145 mmol/L 138  135    Potassium 3.5 - 5.1 mmol/L 4.5  4.2    Chloride 98 - 111 mmol/L 105  104    CO2 22 - 32 mmol/L 25  21    Calcium 8.9 - 10.3 mg/dL 8.7  8.4    Total Protein 6.5 - 8.1 g/dL 6.6  6.5    Total Bilirubin 0.3 - 1.2 mg/dL 0.8  1.1    Alkaline Phos 38 - 126 U/L 110  109    AST 15 - 41 U/L 32  31    ALT 0 - 44 U/L 35  33       No results found for: "CEA1", "CEA" / No results found for: "CEA1", "CEA" Lab Results  Component Value Date   PSA1 1.8 12/28/2022   No results found for: "VOZ366" No results found for: "CAN125"  No results found for: "TOTALPROTELP", "ALBUMINELP", "A1GS", "A2GS", "BETS", "BETA2SER", "GAMS", "MSPIKE", "SPEI" Lab Results  Component Value Date   TIBC 235 (L) 03/16/2020   FERRITIN 126 03/16/2020   IRONPCTSAT 24 03/16/2020   No results found for: "LDH"   STUDIES:   No results found.

## 2023-02-08 ENCOUNTER — Encounter: Payer: Self-pay | Admitting: Licensed Clinical Social Worker

## 2023-02-08 ENCOUNTER — Inpatient Hospital Stay: Payer: Medicare Other

## 2023-02-08 ENCOUNTER — Inpatient Hospital Stay (HOSPITAL_BASED_OUTPATIENT_CLINIC_OR_DEPARTMENT_OTHER): Payer: Medicare Other | Admitting: Hematology

## 2023-02-08 ENCOUNTER — Inpatient Hospital Stay: Payer: Medicare Other | Attending: Hematology | Admitting: Licensed Clinical Social Worker

## 2023-02-08 VITALS — BP 112/71 | HR 65 | Temp 97.8°F | Resp 16 | Wt 250.4 lb

## 2023-02-08 DIAGNOSIS — M810 Age-related osteoporosis without current pathological fracture: Secondary | ICD-10-CM

## 2023-02-08 DIAGNOSIS — Z09 Encounter for follow-up examination after completed treatment for conditions other than malignant neoplasm: Secondary | ICD-10-CM

## 2023-02-08 DIAGNOSIS — C61 Malignant neoplasm of prostate: Secondary | ICD-10-CM

## 2023-02-08 DIAGNOSIS — Z803 Family history of malignant neoplasm of breast: Secondary | ICD-10-CM | POA: Diagnosis not present

## 2023-02-08 DIAGNOSIS — Z923 Personal history of irradiation: Secondary | ICD-10-CM | POA: Insufficient documentation

## 2023-02-08 DIAGNOSIS — D649 Anemia, unspecified: Secondary | ICD-10-CM | POA: Diagnosis not present

## 2023-02-08 DIAGNOSIS — E1122 Type 2 diabetes mellitus with diabetic chronic kidney disease: Secondary | ICD-10-CM | POA: Diagnosis not present

## 2023-02-08 DIAGNOSIS — Z8042 Family history of malignant neoplasm of prostate: Secondary | ICD-10-CM

## 2023-02-08 DIAGNOSIS — Z79899 Other long term (current) drug therapy: Secondary | ICD-10-CM | POA: Diagnosis not present

## 2023-02-08 DIAGNOSIS — N189 Chronic kidney disease, unspecified: Secondary | ICD-10-CM | POA: Diagnosis not present

## 2023-02-08 DIAGNOSIS — I129 Hypertensive chronic kidney disease with stage 1 through stage 4 chronic kidney disease, or unspecified chronic kidney disease: Secondary | ICD-10-CM | POA: Insufficient documentation

## 2023-02-08 LAB — COMPREHENSIVE METABOLIC PANEL
ALT: 35 U/L (ref 0–44)
AST: 32 U/L (ref 15–41)
Albumin: 2.9 g/dL — ABNORMAL LOW (ref 3.5–5.0)
Alkaline Phosphatase: 110 U/L (ref 38–126)
Anion gap: 8 (ref 5–15)
BUN: 35 mg/dL — ABNORMAL HIGH (ref 8–23)
CO2: 25 mmol/L (ref 22–32)
Calcium: 8.7 mg/dL — ABNORMAL LOW (ref 8.9–10.3)
Chloride: 105 mmol/L (ref 98–111)
Creatinine, Ser: 2.24 mg/dL — ABNORMAL HIGH (ref 0.61–1.24)
GFR, Estimated: 31 mL/min — ABNORMAL LOW (ref >60)
Glucose, Bld: 149 mg/dL — ABNORMAL HIGH (ref 70–99)
Potassium: 4.5 mmol/L (ref 3.5–5.1)
Sodium: 138 mmol/L (ref 135–145)
Total Bilirubin: 0.8 mg/dL (ref 0.3–1.2)
Total Protein: 6.6 g/dL (ref 6.5–8.1)

## 2023-02-08 LAB — CBC WITH DIFFERENTIAL/PLATELET
Abs Immature Granulocytes: 0.02 10*3/uL (ref 0.00–0.07)
Basophils Absolute: 0 10*3/uL (ref 0.0–0.1)
Basophils Relative: 1 %
Eosinophils Absolute: 0.2 10*3/uL (ref 0.0–0.5)
Eosinophils Relative: 3 %
HCT: 34.4 % — ABNORMAL LOW (ref 39.0–52.0)
Hemoglobin: 11.3 g/dL — ABNORMAL LOW (ref 13.0–17.0)
Immature Granulocytes: 0 %
Lymphocytes Relative: 29 %
Lymphs Abs: 1.4 10*3/uL (ref 0.7–4.0)
MCH: 30.1 pg (ref 26.0–34.0)
MCHC: 32.8 g/dL (ref 30.0–36.0)
MCV: 91.7 fL (ref 80.0–100.0)
Monocytes Absolute: 0.4 10*3/uL (ref 0.1–1.0)
Monocytes Relative: 8 %
Neutro Abs: 2.9 10*3/uL (ref 1.7–7.7)
Neutrophils Relative %: 59 %
Platelets: 238 10*3/uL (ref 150–400)
RBC: 3.75 MIL/uL — ABNORMAL LOW (ref 4.22–5.81)
RDW: 14.7 % (ref 11.5–15.5)
WBC: 4.9 10*3/uL (ref 4.0–10.5)
nRBC: 0 % (ref 0.0–0.2)

## 2023-02-08 LAB — PSA: Prostatic Specific Antigen: 0.74 ng/mL (ref 0.00–4.00)

## 2023-02-08 LAB — GENETIC SCREENING ORDER

## 2023-02-08 LAB — MAGNESIUM: Magnesium: 2 mg/dL (ref 1.7–2.4)

## 2023-02-08 NOTE — Patient Instructions (Addendum)
[facility] at [facility] Discharge Instructions   You were seen and examined today by Dr. Ellin Saba.  He reviewed the results of your lab work which are normal/stable.   Continue Nubeqa as prescribed.   Be sure to drink plenty of fluids daily (about 80 ounces). Your kidney function has gone up a little bit from the last time we checked. Increasing your fluid intake will help with this.   Return as scheduled.    Thank you for choosing [facility] at [facility] to provide your oncology and hematology care.  To afford each patient quality time with our provider, please arrive at least 15 minutes before your scheduled appointment time.   If you have a lab appointment with the Cancer Center please come in thru the Main Entrance and check in at the main information desk.  You need to re-schedule your appointment should you arrive 10 or more minutes late.  We strive to give you quality time with our providers, and arriving late affects you and other patients whose appointments are after yours.  Also, if you no show three or more times for appointments you may be dismissed from the clinic at the providers discretion.     Again, thank you for choosing [facility].  Our hope is that these requests will decrease the amount of time that you wait before being seen by our physicians.       _____________________________________________________________  Should you have questions after your visit to [facility], please contact our office at [phone] and follow the prompts.  Our office hours are 8:00 a.m. and 4:30 p.m. Monday - Friday.  Please note that voicemails left after 4:00 p.m. may not be returned until the following business day.  We are closed weekends and major holidays.  You do have access to a nurse 24-7, just call the main number to the clinic [phone] and do not press any options, hold on the line and a nurse will  answer the phone.    For prescription refill requests, have your pharmacy contact our office and allow 72 hours.    Due to Covid, you will need to wear a mask upon entering the hospital. If you do not have a mask, a mask will be given to you at the Main Entrance upon arrival. For doctor visits, patients may have 1 support person age 52 or older with them. For treatment visits, patients can not have anyone with them due to social distancing guidelines and our immunocompromised population.

## 2023-02-08 NOTE — Progress Notes (Signed)
REFERRING PROVIDER: Doreatha MassedKatragadda, Sreedhar, MD [address] [place] [id number]  PRIMARY PROVIDER:  Benita StabileHall, John Z, MD  PRIMARY REASON FOR VISIT:  1. Prostate cancer   2. Family history of breast cancer   3. Family history of prostate cancer    I connected with [name] on 02/08/2023 at 9:30 AM EDT by MyChart video conference and verified that I am speaking with the correct person using two identifiers.    Patient [facility]: [facility] Provider location: [facility]  HISTORY OF PRESENT ILLNESS:   [name], a 72 y.o. male, was seen for a [place] cancer genetics consultation at the request of Dr. Ellin SabaKatragadda due to a personal and family history of cancer.  [name] presents to clinic today to discuss the possibility of a hereditary predisposition to cancer, genetic testing, and to further clarify his future cancer risks, as well as potential cancer risks for family members.    CANCER HISTORY:  In 2018, at the age of 72, [name] was diagnosed with prostate cancer, Gleason 4+5=9. This was treated with radiation and ADT.   Past Medical History:  Diagnosis Date   Arthritis    Cerebrovascular disease    MRI shows Right carotid inferior cavernours narrowing 75% and  50-75% stenosis of Cavernous and supraclinoi right side   CKD (chronic kidney disease) 06/28/2014   Sees Dr Lowell GuitarPowell   Diabetic retinopathy    Hyperlipidemia    Hypertension    Myocardial infarction    "mild" heart attack   Necrosis    #2 nail    Pneumonia    as a child   Poor circulation of extremity    Prostate cancer 2017   Prostate   Sleep apnea    uses cpap, getting a new one   Type 2 Diabetes mellitus    Type 2    Past Surgical History:  Procedure Laterality Date   ABOVE KNEE LEG AMPUTATION Left 2004   started out below knee and then extended to above knee due to poor healing   AMPUTATION Right 04/28/2015   Procedure: AMPUTATION RAY, RIGHT 5TH TOE;  Surgeon: Eldred MangesMark C Yates, MD;   Location: [facility] OR;  Service: Orthopedics;  Laterality: Right;   AMPUTATION Right 05/10/2015   Procedure: Right Below Knee Amputation;  Surgeon: Eldred MangesMark C Yates, MD;  [facility];  Service: Orthopedics;  Laterality: Right;   CATARACT EXTRACTION W/PHACO  09/05/2012   Procedure: CATARACT EXTRACTION PHACO AND INTRAOCULAR LENS PLACEMENT (IOC);  Surgeon: Gemma PayorKerry Hunt, MD;  Location: [facility] ORS;  Service: Ophthalmology;  Laterality: Left;  CDE=5.45   CATARACT EXTRACTION W/PHACO  10/03/2012   Procedure: CATARACT EXTRACTION PHACO AND INTRAOCULAR LENS PLACEMENT (IOC);  Surgeon: Gemma PayorKerry Hunt, MD;  Location: [facility];  Service: Ophthalmology;  Laterality: Right;  CDE: 12.31   COLONOSCOPY  2011   Dr. Arlyce DiceKaplan: colon polyps, tubular adenoma   COLONOSCOPY N/A 11/01/2018   Dr. Jena Gaussourk: Blood noted in the rectal vault.  Vascular pattern of the rectum abnormal, neovascular changes distally and actively oozing.  There were 3 2 to 5 mm polyps in the splenic flexure, cecum removed.  Cecal polyp was not recovered.  Radiation proctitis status post APC treatment.  The splenic flexure polyps were tubular adenomas.  Next colonoscopy in 5 years.   FLEXIBLE SIGMOIDOSCOPY N/A 10/27/2019   Procedure: FLEXIBLE SIGMOIDOSCOPY;  Surgeon: West BaliFields, Sandi L, MD; rectal bleeding due to radiation proctitis s/p APC therapy (actively bleeding during exam), rectosigmoid colon  and sigmoid colon appeared normal.   FLEXIBLE SIGMOIDOSCOPY N/A 03/13/2022   Procedure: FLEXIBLE SIGMOIDOSCOPY;  Surgeon: Lanelle Bal, DO;  Location: [facility];  Service: Endoscopy;  Laterality: N/A;  1:45pm   HOT HEMOSTASIS  03/13/2022   Procedure: HOT HEMOSTASIS (ARGON PLASMA COAGULATION/BICAP);  Surgeon: Lanelle Bal, DO;  Location: [facility];  Service: Endoscopy;;   POLYPECTOMY  11/01/2018   Procedure: POLYPECTOMY;  Surgeon: Corbin Ade, MD;  Location: [facility];  Service: Endoscopy;;  cecum,splenic flexure   REFRACTIVE SURGERY Left 08/01/2021   Cleaned  cataract    FAMILY HISTORY:  We obtained a detailed, 4-generation family history.  Significant diagnoses are listed below: Family History  Problem Relation Age of Onset   Diabetes Mother    Hypertension Mother    Lung cancer Father 23   Breast cancer Sister 80   Prostate cancer Maternal Grandfather    Sickle cell anemia Daughter    Prostate cancer Cousin    Prostate cancer Cousin    Colon cancer Neg Hx    [name] had 2 daughters and 1 son. One daughter passed of sickle cell and his son was stillborn. His living daughter is 6 with no cancer history. Patient has 3 sisters. One sister had breast cancer at 63 and is living at 60.  [name] mother passed at 65. Patient has 2 maternal cousins and maternal grandfather that had prostate cancer.   [name] father was adopted. He had lung cancer and passed at 62.  [name] is unaware of previous family history of genetic testing for hereditary cancer risks. There is no reported Ashkenazi Jewish ancestry. There is no known consanguinity.    GENETIC COUNSELING ASSESSMENT: [name] is a 72 y.o. male with a personal and family history of prostate cancer which is somewhat suggestive of a hereditary cancer syndrome and predisposition to cancer. We, therefore, discussed and recommended the following at today's visit.   DISCUSSION: We discussed that approximately 10% of prostate cancer is hereditary. Most cases of hereditary prostate cancer are associated with BRCA1/BRCA2 genes, although there are other genes associated with hereditary cancer as well. Cancers and risks are gene specific. We discussed that testing is beneficial for several reasons including knowing about cancer risks, identifying potential screening and risk-reduction options that may be appropriate, and to understand if other family members could be at risk for cancer and allow them to undergo genetic testing.   We reviewed the characteristics, features and inheritance patterns of  hereditary cancer syndromes. We also discussed genetic testing, including the appropriate family members to test, the process of testing, insurance coverage and turn-around-time for results. We discussed the implications of a negative, positive and/or variant of uncertain significant result. We recommended [name] pursue genetic testing for the [facility] Multi-Cancer+RNA gene panel.   Based on [name]'s personal and family history of cancer, he meets medical criteria for genetic testing. Despite that he meets criteria, he may still have an out of pocket cost. We discussed that if his out of pocket cost for testing is over $100, the laboratory will call and confirm whether he wants to proceed with testing.  If the out of pocket cost of testing is less than $100 he will be billed by the genetic testing laboratory.  al genetics clinic in anticipation of new discoveries regarding hereditary cancer genetic testing.    PLAN: After considering the risks, benefits, and limitations, [name] provided informed consent to pursue genetic testing  and the blood sample was sent to [facility] for analysis of the Multi-Cancer+RNA panel. Results should be available within approximately 2-3 weeks' time, at which point they will be disclosed by telephone to [name], as will any additional recommendations warranted by these results. [name] will receive a summary of his genetic counseling visit and a copy of his results once available. This information will also be available in Epic.   [name] questions were answered to his satisfaction today. Our contact information was provided should additional questions or concerns arise. Thank you for the referral and allowing [place] to share in the care of your patient.   Lacy Duverney, MS, [facility] Genetic Counselor [email or web address] Phone: [phone]  The patient was seen for a total of 15 minutes in virtual genetic counseling. Patient's wife [name] was also  present.  Dr. Orlie Dakin was available for discussion regarding this case.   _______________________________________________________________________ For Office Staff:  Number of people involved in session: 2 Was an Intern/ student involved with case: no

## 2023-02-08 NOTE — Progress Notes (Signed)
Patient is taking Nubeqa as prescribed. He has not missed any doses and reports no side effects at this time.   

## 2023-02-12 ENCOUNTER — Other Ambulatory Visit: Payer: Self-pay | Admitting: *Deleted

## 2023-02-14 ENCOUNTER — Ambulatory Visit (INDEPENDENT_AMBULATORY_CARE_PROVIDER_SITE_OTHER): Payer: Medicare Other | Admitting: Nurse Practitioner

## 2023-02-14 ENCOUNTER — Encounter: Payer: Self-pay | Admitting: Nurse Practitioner

## 2023-02-14 VITALS — BP 112/70 | HR 83 | Ht 71.0 in | Wt 252.0 lb

## 2023-02-14 DIAGNOSIS — E1159 Type 2 diabetes mellitus with other circulatory complications: Secondary | ICD-10-CM

## 2023-02-14 DIAGNOSIS — E559 Vitamin D deficiency, unspecified: Secondary | ICD-10-CM

## 2023-02-14 DIAGNOSIS — E782 Mixed hyperlipidemia: Secondary | ICD-10-CM | POA: Diagnosis not present

## 2023-02-14 DIAGNOSIS — I10 Essential (primary) hypertension: Secondary | ICD-10-CM

## 2023-02-14 NOTE — Progress Notes (Signed)
02/14/2023   Endocrinology follow-up note    Subjective:    Patient ID: [name], male    DOB: Apr 16, 1951, PCP Benita Stabile, MD   Past Medical History:  Diagnosis Date   Arthritis    Cerebrovascular disease    MRI shows Right carotid inferior cavernours narrowing 75% and  50-75% stenosis of Cavernous and supraclinoi right side   CKD (chronic kidney disease) 06/28/2014   Sees Dr Lowell Guitar   Diabetic retinopathy    Hyperlipidemia    Hypertension    Myocardial infarction    "mild" heart attack   Necrosis    #2 nail    Pneumonia    as a child   Poor circulation of extremity    Prostate cancer 2017   Prostate   Sleep apnea    uses cpap, getting a new one   Type 2 Diabetes mellitus    Type 2   Past Surgical History:  Procedure Laterality Date   ABOVE KNEE LEG AMPUTATION Left 2004   started out below knee and then extended to above knee due to poor healing   AMPUTATION Right 04/28/2015   Procedure: AMPUTATION RAY, RIGHT 5TH TOE;  Surgeon: Eldred Manges, MD;  Location: [facility] OR;  Service: Orthopedics;  Laterality: Right;   AMPUTATION Right 05/10/2015   Procedure: Right Below Knee Amputation;  Surgeon: Eldred Manges, MD;  Location: [facility] OR;  Service: Orthopedics;  Laterality: Right;   CATARACT EXTRACTION W/PHACO  09/05/2012   Procedure: CATARACT EXTRACTION PHACO AND INTRAOCULAR LENS PLACEMENT (IOC);  Surgeon: Gemma Payor, MD;  Location: [facility] ORS;  Service: Ophthalmology;  Laterality: Left;  CDE=5.45   CATARACT EXTRACTION W/PHACO  10/03/2012   Procedure: CATARACT EXTRACTION PHACO AND INTRAOCULAR LENS PLACEMENT (IOC);  Surgeon: Gemma Payor, MD;  Location: [facility];  Service: Ophthalmology;  Laterality: Right;  CDE: 12.31   COLONOSCOPY  2011   Dr. Arlyce Dice: colon polyps, tubular adenoma   COLONOSCOPY N/A 11/01/2018   Dr. Jena Gauss: Blood noted in the rectal vault.  Vascular pattern of the rectum abnormal, neovascular changes distally and actively oozing.  There were 3 2 to 5 mm polyps in the  splenic flexure, cecum removed.  Cecal polyp was not recovered.  Radiation proctitis status post APC treatment.  The splenic flexure polyps were tubular adenomas.  Next colonoscopy in 5 years.   FLEXIBLE SIGMOIDOSCOPY N/A 10/27/2019   Procedure: FLEXIBLE SIGMOIDOSCOPY;  Surgeon: West Bali, MD; rectal bleeding due to radiation proctitis s/p APC therapy (actively bleeding during exam), rectosigmoid colon and sigmoid colon appeared normal.   FLEXIBLE SIGMOIDOSCOPY N/A 03/13/2022   Procedure: FLEXIBLE SIGMOIDOSCOPY;  Surgeon: Lanelle Bal, DO;  Location: [facility];  Service: Endoscopy;  Laterality: N/A;  1:45pm   HOT HEMOSTASIS  03/13/2022   Procedure: HOT HEMOSTASIS (ARGON PLASMA COAGULATION/BICAP);  Surgeon: Lanelle Bal, DO;  Location: [facility];  Service: Endoscopy;;   POLYPECTOMY  11/01/2018   Procedure: POLYPECTOMY;  Surgeon: Corbin Ade, MD;  Location: [facility];  Service: Endoscopy;;  cecum,splenic flexure   REFRACTIVE SURGERY Left 08/01/2021   Cleaned cataract   Social History   Socioeconomic History   Marital status: Married    Spouse name: Not on file   Number of children: 2   Years of education: college   Highest education level: Not on file  Occupational History   Occupation: Education officer, environmental    Employer: [employer]  Tobacco Use   Smoking status: Never  Passive exposure: Never   Smokeless tobacco: Never  Vaping Use   Vaping Use: Never used  Substance and Sexual Activity   Alcohol use: No   Drug use: No   Sexual activity: Not Currently  Other Topics Concern   Not on file  Social History Narrative   Patient lives with his wife    Patient right handed   Patient drinks caffine on occ.   Social Determinants of Health   Financial Resource Strain: Not on file  Food Insecurity: No Food Insecurity (01/04/2023)   Hunger Vital Sign    Worried About Running Out of Food in the Last Year: Never true    Ran Out of Food in the Last Year: Never  true  Transportation Needs: No Transportation Needs (01/04/2023)   PRAPARE - Administrator, Civil Service (Medical): No    Lack of Transportation (Non-Medical): No  Physical Activity: Not on file  Stress: Not on file  Social Connections: Not on file   Outpatient Encounter Medications as of 02/14/2023  Medication Sig   acetaminophen (TYLENOL) 325 MG tablet Take 650 mg by mouth daily as needed for mild pain, moderate pain or headache.   atenolol (TENORMIN) 25 MG tablet TAKE 1 TABLET BY MOUTH EVERY DAY   atorvastatin (LIPITOR) 80 MG tablet TAKE 1 TABLET BY MOUTH EVERY DAY   B-D UF III MINI PEN NEEDLES 31G X 5 MM MISC USE FOUR TIMES DAILY AS DIRECTED   Calcium Carb-Cholecalciferol 600-500 MG-UNIT CAPS Take 2 capsules by mouth daily.    clopidogrel (PLAVIX) 75 MG tablet TAKE 1 TABLET BY MOUTH EVERY DAY   CONTOUR TEST test strip TEST TWICE DAILY E11.65   darolutamide (NUBEQA) 300 MG tablet Take 2 tablets (600 mg total) by mouth 2 (two) times daily with a meal.   furosemide (LASIX) 20 MG tablet Take 20 mg by mouth 3 (three) times a week.   insulin degludec (TRESIBA FLEXTOUCH) 100 UNIT/ML SOPN FlexTouch Pen Inject 30 Units into the skin at bedtime.   latanoprost (XALATAN) 0.005 % ophthalmic solution Place 1 drop into both eyes at bedtime.   prochlorperazine (COMPAZINE) 10 MG tablet Take 1 tablet (10 mg total) by mouth every 6 (six) hours as needed for nausea or vomiting.   Semaglutide, 1 MG/DOSE, 4 MG/3ML SOPN Inject 1 mg as directed once a week.   Vibegron (GEMTESA) 75 MG TABS Take 1 tablet (75 mg total) by mouth daily.   No facility-administered encounter medications on file as of 02/14/2023.   ALLERGIES: Allergies  Allergen Reactions   Zolpidem Tartrate Other (See Comments)    disorientation    VACCINATION STATUS: Immunization History  Administered Date(s) Administered   Fluad Quad(high Dose 65+) 07/01/2019   Influenza Split 06/30/2013, 08/30/2013, 08/23/2020    Influenza-Unspecified 07/30/2015, 06/30/2018   Moderna Sars-Covid-2 Vaccination 11/20/2019, 12/17/2019   Pneumococcal Conjugate-13 01/08/2018   Pneumococcal Polysaccharide-23 07/31/2007, 08/26/2019   Td 10/30/2002   Tdap 06/19/2016   Zoster, Live 06/19/2016    Diabetes He presents for his follow-up diabetic visit. He has type 2 diabetes mellitus. Onset time: He was diagnosed at approximate age of 41 years. His disease course has been improving. There are no hypoglycemic associated symptoms. Pertinent negatives for hypoglycemia include no confusion, pallor or seizures. There are no diabetic associated symptoms. Pertinent negatives for diabetes include no fatigue, no polydipsia, no polyphagia, no polyuria and no weakness. There are no hypoglycemic complications. Symptoms are stable. Diabetic complications include heart disease, nephropathy and PVD. (Status  post bilateral lower extremity amputations with prosthetics.) Risk factors for coronary artery disease include diabetes mellitus, dyslipidemia, hypertension, male sex, obesity, sedentary lifestyle and tobacco exposure. Current diabetic treatment includes insulin injections (and Ozempic). He is compliant with treatment most of the time. His weight is fluctuating minimally. He is following a diabetic diet. When asked about meal planning, he reported none. He has had a previous visit with a dietitian. He participates in exercise daily (5 x per week). His home blood glucose trend is decreasing steadily. His overall blood glucose range is 140-180 mg/dl. (He presents today with his meter, no logs, showing stable, at target fasting glycemic profile.  His most recent A1c, checked by PCP was 7%, increasing slightly from last visit.  He denies any hypoglycemia.  He has tolerated the 0.5 mg Ozempic well.  He notes his prostate cancer medications have been changed since last visit and he did notice a spike in glucose right after the change but looks like it is  self-correcting now.  Analysis of his meter shows 7-day average of 131, 14-day average of 140, 30-day average of 142.) An ACE inhibitor/angiotensin II receptor blocker is not being taken. He does not see a podiatrist.Eye exam is current.  Hyperlipidemia This is a chronic problem. The current episode started more than 1 year ago. The problem is controlled. Recent lipid tests were reviewed and are normal. Exacerbating diseases include chronic renal disease, diabetes and obesity. Factors aggravating his hyperlipidemia include beta blockers. Pertinent negatives include no myalgias. Current antihyperlipidemic treatment includes statins. The current treatment provides moderate improvement of lipids. There are no compliance problems.  Risk factors for coronary artery disease include dyslipidemia, diabetes mellitus, hypertension, male sex, obesity and a sedentary lifestyle.  Hypertension This is a chronic problem. The current episode started more than 1 year ago. The problem is unchanged. The problem is controlled. There are no associated agents to hypertension. Risk factors for coronary artery disease include diabetes mellitus, dyslipidemia, family history, male gender and sedentary lifestyle. Past treatments include beta blockers and diuretics. The current treatment provides moderate improvement. There are no compliance problems.  Hypertensive end-organ damage includes kidney disease and PVD. Identifiable causes of hypertension include chronic renal disease.    Review of systems  Constitutional: + Minimally fluctuating body weight,  current Body mass index is 35.15 kg/m. , no fatigue, no subjective hyperthermia, no subjective hypothermia Eyes: no blurry vision, no xerophthalmia ENT: no sore throat, no nodules palpated in throat, no dysphagia/odynophagia, no hoarseness Cardiovascular: no chest pain, no shortness of breath, no palpitations Respiratory: no cough, no shortness of breath Gastrointestinal: no  nausea/vomiting/diarrhea Musculoskeletal: no muscle/joint aches, walks with walker d/t BLE amputations with prosthesis Skin: no rashes, no hyperemia Neurological: no tremors, no numbness, no tingling, no dizziness Psychiatric: no depression, no anxiety    Objective:    BP 112/70 (BP Location: Right Arm, Patient Position: Sitting, Cuff Size: Large)   Pulse 83   Ht 5\' 11"  (1.803 m)   Wt 252 lb (114.3 kg)   BMI 35.15 kg/m   Wt Readings from Last 3 Encounters:  02/14/23 252 lb (114.3 kg)  02/08/23 250 lb 7.1 oz (113.6 kg)  01/04/23 250 lb 14.1 oz (113.8 kg)    BP Readings from Last 3 Encounters:  02/14/23 112/70  02/08/23 112/71  01/25/23 110/74    Physical Exam- Limited  Constitutional:  Body mass index is 35.15 kg/m., not in acute distress, normal state of mind Eyes:  EOMI, no exophthalmos Musculoskeletal: walks  with walker d/t BLE amputations with prosthesis Skin:  no rashes, no hyperemia Neurological: no tremor with outstretched hands    Results for orders placed or performed in visit on 02/08/23  Genetic Screening Order  Result Value Ref Range   Genetic Screening Order Specimen collected. Sent to reference laboratory.   Magnesium  Result Value Ref Range   Magnesium 2.0 1.7 - 2.4 mg/dL  Comprehensive metabolic panel  Result Value Ref Range   Sodium 138 135 - 145 mmol/L   Potassium 4.5 3.5 - 5.1 mmol/L   Chloride 105 98 - 111 mmol/L   CO2 25 22 - 32 mmol/L   Glucose, Bld 149 (H) 70 - 99 mg/dL   BUN 35 (H) 8 - 23 mg/dL   Creatinine, Ser 1.61 (H) 0.61 - 1.24 mg/dL   Calcium 8.7 (L) 8.9 - 10.3 mg/dL   Total Protein 6.6 6.5 - 8.1 g/dL   Albumin 2.9 (L) 3.5 - 5.0 g/dL   AST 32 15 - 41 U/L   ALT 35 0 - 44 U/L   Alkaline Phosphatase 110 38 - 126 U/L   Total Bilirubin 0.8 0.3 - 1.2 mg/dL   GFR, Estimated 31 (L) >60 mL/min   Anion gap 8 5 - 15  CBC with Differential  Result Value Ref Range   WBC 4.9 4.0 - 10.5 K/uL   RBC 3.75 (L) 4.22 - 5.81 MIL/uL    Hemoglobin 11.3 (L) 13.0 - 17.0 g/dL   HCT 09.6 (L) 04.5 - 40.9 %   MCV 91.7 80.0 - 100.0 fL   MCH 30.1 26.0 - 34.0 pg   MCHC 32.8 30.0 - 36.0 g/dL   RDW 81.1 91.4 - 78.2 %   Platelets 238 150 - 400 K/uL   nRBC 0.0 0.0 - 0.2 %   Neutrophils Relative % 59 %   Neutro Abs 2.9 1.7 - 7.7 K/uL   Lymphocytes Relative 29 %   Lymphs Abs 1.4 0.7 - 4.0 K/uL   Monocytes Relative 8 %   Monocytes Absolute 0.4 0.1 - 1.0 K/uL   Eosinophils Relative 3 %   Eosinophils Absolute 0.2 0.0 - 0.5 K/uL   Basophils Relative 1 %   Basophils Absolute 0.0 0.0 - 0.1 K/uL   Immature Granulocytes 0 %   Abs Immature Granulocytes 0.02 0.00 - 0.07 K/uL  PSA  Result Value Ref Range   Prostatic Specific Antigen 0.74 0.00 - 4.00 ng/mL   Complete Blood Count (Most recent): Lab Results  Component Value Date   WBC 4.9 02/08/2023   HGB 11.3 (L) 02/08/2023   HCT 34.4 (L) 02/08/2023   MCV 91.7 02/08/2023   PLT 238 02/08/2023   Chemistry (most recent): Lab Results  Component Value Date   NA 138 02/08/2023   K 4.5 02/08/2023   CL 105 02/08/2023   CO2 25 02/08/2023   BUN 35 (H) 02/08/2023   CREATININE 2.24 (H) 02/08/2023   Diabetic Labs (most recent): Lab Results  Component Value Date   HGBA1C 6.5 (A) 08/15/2022   HGBA1C 6.5 02/13/2022   HGBA1C 7.5 09/01/2021   MICROALBUR 30 08/06/2020   MICROALBUR 0.2 10/31/2016   Lipid Panel     Component Value Date/Time   CHOL 128 09/01/2021 0000   CHOL 113 01/31/2021 0937   TRIG 74 09/01/2021 0000   TRIG 98 10/27/2013 1305   HDL 46 09/01/2021 0000   HDL 47 01/31/2021 0937   HDL 39 (L) 10/27/2013 1305   CHOLHDL 2.4 01/31/2021 9562  CHOLHDL 3.0 02/24/2020 0915   VLDL 14 10/31/2016 1137   LDLCALC 67 09/01/2021 0000   LDLCALC 51 01/31/2021 0937   LDLCALC 50 02/24/2020 0915   LDLCALC 82 10/27/2013 1305    Assessment & Plan:   1) Type 2 diabetes mellitus with other circulatory complications (HCC)  -His diabetes is complicated by extensive peripheral  arterial disease with bilateral lower extremity amputations and CKD and he remains at a high risk for more acute and chronic complications of diabetes which include CAD, CVA, CKD, retinopathy, and neuropathy. These are all discussed in detail with the patient.  He presents today with his meter, no logs, showing stable, at target fasting glycemic profile.  His most recent A1c, checked by PCP was 7%, increasing slightly from last visit.  He denies any hypoglycemia.  He has tolerated the 0.5 mg Ozempic well.  He notes his prostate cancer medications have been changed since last visit and he did notice a spike in glucose right after the change but looks like it is self-correcting now.  Analysis of his meter shows 7-day average of 131, 14-day average of 140, 30-day average of 142.   -Recent labs reviewed.  - Nutritional counseling repeated at each appointment due to patients tendency to fall back in to old habits.  - The patient admits there is a room for improvement in their diet and drink choices. -  Suggestion is made for the patient to avoid simple carbohydrates from their diet including Cakes, Sweet Desserts / Pastries, Ice Cream, Soda (diet and regular), Sweet Tea, Candies, Chips, Cookies, Sweet Pastries, Store Bought Juices, Alcohol in Excess of 1-2 drinks a day, Artificial Sweeteners, Coffee Creamer, and "Sugar-free" Products. This will help patient to have stable blood glucose profile and potentially avoid unintended weight gain.   - I encouraged the patient to switch to unprocessed or minimally processed complex starch and increased protein intake (animal or plant source), fruits, and vegetables.   - Patient is advised to stick to a routine mealtimes to eat 3 meals a day and avoid unnecessary snacks (to snack only to correct hypoglycemia).  - I have approached patient with the following individualized plan to manage diabetes and patient agrees.  -Given his stable glycemic profile, will continue  current medication regimen.   -He is advised to continue Tresiba 30 units SQ nightly (sample provided) and continue Ozempic 0.5 mg SQ weekly.  He did bring out renewal PAP form for [place] to fill out today.    -He is encouraged to continue monitoring blood glucose twice daily, before breakfast and before bed, and call the clinic if he gets readings less than 70 or greater than 200 for 3 tests in a row.  - His insurance did not provide coverage for Ozempic nor Trulicity but he does have help with Patient assistance program.  2) Hypertension: His blood pressure is controlled to target.  He is advised to continue Atenolol 25 mg po daily, and Lasix 20 mg po every other day.  3) Lipids/HPL:  His most recent lipid panel from 01/12/23 shows controlled LDL at 50.  He is advised to continue Lipitor 80 mg po daily at bedtime.  Side effects and precautions discussed with him.   - I advised patient to maintain close follow up with Benita Stabile, MD for primary care needs.      I spent  31  minutes in the care of the patient today including review of labs from CMP, Lipids, Thyroid Function, Hematology (current  and previous including abstractions from other facilities); face-to-face time discussing  his blood glucose readings/logs, discussing hypoglycemia and hyperglycemia episodes and symptoms, medications doses, his options of short and long term treatment based on the latest standards of care / guidelines;  discussion about incorporating lifestyle medicine;  and documenting the encounter. Risk reduction counseling performed per USPSTF guidelines to reduce obesity and cardiovascular risk factors.     Please refer to Patient Instructions for Blood Glucose Monitoring and Insulin/Medications Dosing Guide"  in media tab for additional information. Please  also refer to " Patient Self Inventory" in the Media  tab for reviewed elements of pertinent patient history.  Floydene Flock Elliff participated in the discussions,  expressed understanding, and voiced agreement with the above plans.  All questions were answered to his satisfaction. he is encouraged to contact clinic should he have any questions or concerns prior to his return visit.    Follow up plan: -Return in about 4 months (around 06/16/2023) for Diabetes F/U with A1c in office, No previsit labs, Bring meter and logs.  Ronny Bacon, [facility] [address] [place], [place] [address] Phone: [phone] Fax: [phone]  02/14/2023, 9:40 AM

## 2023-02-15 ENCOUNTER — Telehealth: Payer: Self-pay | Admitting: Nurse Practitioner

## 2023-02-15 DIAGNOSIS — H40023 Open angle with borderline findings, high risk, bilateral: Secondary | ICD-10-CM | POA: Diagnosis not present

## 2023-02-15 NOTE — Telephone Encounter (Signed)
Left Vm that his paperwork is ready for pick up

## 2023-02-19 ENCOUNTER — Telehealth: Payer: Self-pay | Admitting: Licensed Clinical Social Worker

## 2023-02-19 ENCOUNTER — Ambulatory Visit: Payer: Self-pay | Admitting: Licensed Clinical Social Worker

## 2023-02-19 ENCOUNTER — Encounter: Payer: Self-pay | Admitting: Licensed Clinical Social Worker

## 2023-02-19 DIAGNOSIS — Z1379 Encounter for other screening for genetic and chromosomal anomalies: Secondary | ICD-10-CM | POA: Insufficient documentation

## 2023-02-19 NOTE — Telephone Encounter (Signed)
I contacted Mr. Ruby to discuss his genetic testing results. No pathogenic variants were identified in the 70 genes analyzed. Detailed clinic note to follow.   The test report has been scanned into EPIC and is located under the Molecular Pathology section of the Results Review tab.  A portion of the result report is included below for reference.      Lacy Duverney, MS, [facility] Genetic Counselor [email or web address] Phone: [phone]

## 2023-02-19 NOTE — Progress Notes (Signed)
HPI:   [name] was previously seen in the [facility] due to a personal and family history of cancer and concerns regarding a hereditary predisposition to cancer. Please refer to our prior cancer genetics clinic note for more information regarding our discussion, assessment and recommendations, at the time. [name] recent genetic test results were disclosed to him, as were recommendations warranted by these results. These results and recommendations are discussed in more detail below.  CANCER HISTORY:  Oncology History   No history exists.    FAMILY HISTORY:  We obtained a detailed, 4-generation family history.  Significant diagnoses are listed below: Family History  Problem Relation Age of Onset   Diabetes Mother    Hypertension Mother    Lung cancer Father 71   Breast cancer Sister 71   Prostate cancer Maternal Grandfather    Sickle cell anemia Daughter    Prostate cancer Cousin    Prostate cancer Cousin    Colon cancer Neg Hx     [name] had 2 daughters and 1 son. One daughter passed of sickle cell and his son was stillborn. His living daughter is 41 with no cancer history. Patient has 3 sisters. One sister had breast cancer at 26 and is living at 57.   [name] mother passed at 51. Patient has 2 maternal cousins and maternal grandfather that had prostate cancer.    [name] father was adopted. He had lung cancer and passed at 25.   [name] is unaware of previous family history of genetic testing for hereditary cancer risks. There is no reported Ashkenazi Jewish ancestry. There is no known consanguinity.     GENETIC TEST RESULTS:  The [facility] Multi-Cancer+RNA Panel found no pathogenic mutations.   The Multi-Cancer + RNA Panel offered by [facility] includes sequencing and/or deletion/duplication analysis of the following 70 genes:  AIP*, ALK, APC*, ATM*, AXIN2*, BAP1*, BARD1*, BLM*, BMPR1A*, BRCA1*, BRCA2*, BRIP1*, CDC73*, CDH1*, CDK4, CDKN1B*,  CDKN2A, CHEK2*, CTNNA1*, DICER1*, EPCAM, EGFR, FH*, FLCN*, GREM1, HOXB13, KIT, LZTR1, MAX*, MBD4, MEN1*, MET, MITF, MLH1*, MSH2*, MSH3*, MSH6*, MUTYH*, NF1*, NF2*, NTHL1*, PALB2*, PDGFRA, PMS2*, POLD1*, POLE*, POT1*, PRKAR1A*, PTCH1*, PTEN*, RAD51C*, RAD51D*, RB1*, RET, SDHA*, SDHAF2*, SDHB*, SDHC*, SDHD*, SMAD4*, SMARCA4*, SMARCB1*, SMARCE1*, STK11*, SUFU*, TMEM127*, TP53*, TSC1*, TSC2*, VHL*. RNA analysis is performed for * genes.   The test report has been scanned into EPIC and is located under the Molecular Pathology section of the Results Review tab.  A portion of the result report is included below for reference. Genetic testing reported out on 02/19/2023.      Genetic testing identified a variant of uncertain significance (VUS) in the MSH3 gene called [id number].  At this time, it is unknown if this variant is associated with an increased risk for cancer or if it is benign, but most uncertain variants are reclassified to benign. It should not be used to make medical management decisions. With time, we suspect the laboratory will determine the significance of this variant, if any. If the laboratory reclassifies this variant, we will attempt to contact [name] to discuss it further.   Even though a pathogenic variant was not identified, possible explanations for the cancer in the family may include: There may be no hereditary risk for cancer in the family. The cancers in [name] and/or his family may be sporadic/familial or due to other genetic and environmental factors. There may be a gene mutation in one of these genes that current testing methods cannot  detect but that chance is small. There could be another gene that has not yet been discovered, or that we have not yet tested, that is responsible for the cancer diagnoses in the family.  It is also possible there is a hereditary cause for the cancer in the family that [name] did not inherit.  Therefore, it is important to remain in touch  with cancer genetics in the future so that we can continue to offer [name] the most up to date genetic testing.   ADDITIONAL GENETIC TESTING:  We discussed with [name] that his genetic testing was fairly extensive.  If there are additional relevant genes identified to increase cancer risk that can be analyzed in the future, we would be happy to discuss and coordinate this testing at that time.    CANCER SCREENING RECOMMENDATIONS:  [name] test result is considered negative (normal).  This means that we have not identified a hereditary cause for his personal and family history of cancer at this time.   An individual's cancer risk and medical management are not determined by genetic test results alone. Overall cancer risk assessment incorporates additional factors, including personal medical history, family history, and any available genetic information that may result in a personalized plan for cancer prevention and surveillance. Therefore, it is recommended he continue to follow the cancer management and screening guidelines provided by his oncology and primary healthcare provider.  RECOMMENDATIONS FOR FAMILY MEMBERS:   Since he did not inherit a identifiable mutation in a cancer predisposition gene included on this panel, his children could not have inherited a known mutation from him in one of these genes. Individuals in this family might be at some increased risk of developing cancer, over the general population risk, due to the family history of cancer.  Individuals in the family should notify their providers of the family history of cancer. We recommend women in this family have a yearly mammogram beginning at age 22, or 43 years younger than the earliest onset of cancer, an annual clinical breast exam, and perform monthly breast self-exams.  Family members should have colonoscopies by at age 26, or earlier, as recommended by their providers. Other members of the family may still carry a  pathogenic variant in one of these genes that [name] did not inherit. Based on the family history, we recommend his sister, who was diagnosed with breast at age 71, have genetic counseling and testing. [name] will let us know if we can be of any assistance in coordinating genetic counseling and/or testing for this family member.   We do not recommend familial testing for the MSH3 variant of uncertain significance (VUS).  FOLLOW-UP:  Lastly, we discussed with [name] that cancer genetics is a rapidly advancing field and it is possible that new genetic tests will be appropriate for him and/or his family members in the future. We encouraged him to remain in contact with cancer genetics on an annual basis so we can update his personal and family histories and let him know of advances in cancer genetics that may benefit this family.   Our contact number was provided. [name] questions were answered to his satisfaction, and he knows he is welcome to call us at anytime with additional questions or concerns.    Lacy Duverney, MS, [facility] Genetic Counselor [email or web address] Phone: [phone]\

## 2023-02-21 NOTE — Progress Notes (Signed)
[name] [address] [place] [address]    Clinic Day:  02/22/2023  Referring physician: Benita Stabile, MD  Patient Care Team: Tom Stabile, MD as PCP - General (Internal Medicine) [name], [facility] as Pharmacist (Pharmacist) Tom Bal, DO as Consulting Physician (Internal Medicine) Tom Bal, DO as Consulting Physician (Gastroenterology) Tom Massed, MD as Medical Oncologist (Medical Oncology) Tom Sarah, RN as Oncology Nurse Navigator (Medical Oncology)   ASSESSMENT & PLAN:   Assessment: 1.  Castration refractory prostate cancer, T3 N1 M0: - 04/27/2017: Prostatic adenocarcinoma Gleason 4+5=9 involving both lobes, PSA 9.8 - Status post long-term ADT plus definitive EBRT from 08/27/2017 through 10/26/2017 - History of radiation-induced cystitis and proctitis - Patient on Eligard every 6 months since 03/19/2020 - PSA <0.1 (09/01/2021), 0.2 (03/09/2022), 0.6 (07/05/2022), 0.9 (09/19/2022), 1.8 (2/29/2022) - PSMA PET (12/14/2022): Marked uptake in the prostate gland compatible with residual/recurrent prostate cancer.  No evidence of nodal or distant metastatic disease.  No focal bone mets. - Darolutamide 600 mg twice daily started on 01/16/2023.   2.  Social/family history: - Lives at home with his wife.  Independent of ADLs and IADLs.  He had right BKA and left AKA from diabetes and has prosthesis.  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  He is a non-smoker. - Father had lung cancer.  Maternal grand father had prostate cancer.  Sister had breast cancer.    Plan: 1.  M0 Castrate resistant prostate cancer: - Darolutamide was started on 01/16/2023.  Last Eligard 45 mg on 09/28/2022. - He developed cramps on sides of chest wall and abdomen when he initially took it but they improved.  He still has cramps 2 times per week.  He will take 2 Tylenol tablets as needed if the cramps occur. - Labs today: LFTs are  normal.  Creatinine is 1.73 and better.  CBC was grossly normal.  PSA 0.74 on 02/08/2023. - Continue darolutamide 600 mg twice daily.  RTC 4 weeks for follow-up.  I will plan to repeat a PSA at that time.   2.  Bone health: - DEXA scan was done at [facility] on 06/22/2022. - We will obtain reports of the scan.   3.  Normocytic anemia: - Combination anemia from functional iron deficiency and CKD. - Hemoglobin is 11.6.  We will follow-up on the ferritin and iron panel from today.  No orders of the defined types were placed in this encounter.     I,[name],acting as a Neurosurgeon for Tom Massed, MD.,have documented all relevant documentation on the behalf of Tom Massed, MD,as directed by  Tom Massed, MD while in the presence of Tom Massed, MD.   I, Tom Massed MD, have reviewed the above documentation for accuracy and completeness, and I agree with the above.   Tom Massed, MD   4/25/20246:45 PM  CHIEF COMPLAINT:   Diagnosis: prostate cancer    Cancer Staging  Prostate cancer [facility]) Staging form: Prostate, AJCC 8th Edition - Clinical: Stage IVA (cT1c, cN1, cM0, PSA: 9.8, Grade Group: 5) - Signed by Tom Dys, MD on 06/09/2017    Prior Therapy: radiation therapy 10/29-12/28/2018   Current Therapy:  Leurpolide injections and Darolutamide    HISTORY OF PRESENT ILLNESS:   Oncology History   No history exists.     INTERVAL HISTORY:   [name] is a 72 y.o. male presenting to clinic today for follow up  of prostate cancer. He was last seen by me on 02/08/23.  He underwent genetic testing at his last visit. Results were negative.  Today, he states that he is doing well overall. His appetite level is at 75%. His energy level is at 70%.  PAST MEDICAL HISTORY:   Past Medical History: Past Medical History:  Diagnosis Date   Arthritis    Cerebrovascular disease    MRI shows Right carotid inferior cavernours  narrowing 75% and  50-75% stenosis of Cavernous and supraclinoi right side   CKD (chronic kidney disease) 06/28/2014   Sees Dr [name]   Diabetic retinopathy [facility])    Hyperlipidemia    Hypertension    Myocardial infarction [facility])    "mild" heart attack   Necrosis (HCC)    #2 nail    Pneumonia    as a child   Poor circulation of extremity    Prostate cancer (HCC) 2017   Prostate   Sleep apnea    uses cpap, getting a new one   Type 2 Diabetes mellitus    Type 2    Surgical History: Past Surgical History:  Procedure Laterality Date   ABOVE KNEE LEG AMPUTATION Left 2004   started out below knee and then extended to above knee due to poor healing   AMPUTATION Right 04/28/2015   Procedure: AMPUTATION RAY, RIGHT 5TH TOE;  Surgeon: Tom Manges, MD;  Location: [facility] OR;  Service: Orthopedics;  Laterality: Right;   AMPUTATION Right 05/10/2015   Procedure: Right Below Knee Amputation;  Surgeon: Tom Manges, MD;  Location: [facility] OR;  Service: Orthopedics;  Laterality: Right;   CATARACT EXTRACTION W/PHACO  09/05/2012   Procedure: CATARACT EXTRACTION PHACO AND INTRAOCULAR LENS PLACEMENT (IOC);  Surgeon: Tom Payor, MD;  Location: [facility] ORS;  Service: Ophthalmology;  Laterality: Left;  CDE=5.45   CATARACT EXTRACTION W/PHACO  10/03/2012   Procedure: CATARACT EXTRACTION PHACO AND INTRAOCULAR LENS PLACEMENT (IOC);  Surgeon: Tom Payor, MD;  Location: [facility] ORS;  Service: Ophthalmology;  Laterality: Right;  CDE: 12.31   COLONOSCOPY  2011   Dr. Arlyce Bradshaw: colon polyps, tubular adenoma   COLONOSCOPY N/A 11/01/2018   Dr. Jena Bradshaw: Blood noted in the rectal vault.  Vascular pattern of the rectum abnormal, neovascular changes distally and actively oozing.  There were 3 2 to 5 mm polyps in the splenic flexure, cecum removed.  Cecal polyp was not recovered.  Radiation proctitis status post APC treatment.  The splenic flexure polyps were tubular adenomas.  Next colonoscopy in 5 years.   FLEXIBLE SIGMOIDOSCOPY N/A 10/27/2019    Procedure: FLEXIBLE SIGMOIDOSCOPY;  Surgeon: Tom Bali, MD; rectal bleeding due to radiation proctitis s/p APC therapy (actively bleeding during exam), rectosigmoid colon and sigmoid colon appeared normal.   FLEXIBLE SIGMOIDOSCOPY N/A 03/13/2022   Procedure: FLEXIBLE SIGMOIDOSCOPY;  Surgeon: Tom Bal, DO;  Location[facility];  Service: Endoscopy;  Laterality: N/A;  1:45pm   HOT HEMOSTASIS  03/13/2022   Procedure: HOT HEMOSTASIS (ARGON PLASMA COAGULATION/BICAP);  Surgeon: Tom Bal, DO;  Location: [facility];  Service: Endoscopy;;   POLYPECTOMY  11/01/2018   Procedure: POLYPECTOMY;  Surgeon: Corbin Ade, MD;  Location: [facility];  Service: Endoscopy;;  cecum,splenic flexure   REFRACTIVE SURGERY Left 08/01/2021   Cleaned cataract    Social History: Social History   Socioeconomic History   Marital status: Married    Spouse name: Not on file   Number of children: 2   Years of education:  college   Highest education level: Not on file  Occupational History   Occupation: Engineer, materials: [employer]  Tobacco Use   Smoking status: Never    Passive exposure: Never   Smokeless tobacco: Never  Vaping Use   Vaping Use: Never used  Substance and Sexual Activity   Alcohol use: No   Drug use: No   Sexual activity: Not Currently  Other Topics Concern   Not on file  Social History Narrative   Patient lives with his wife    Patient right handed   Patient drinks caffine on occ.   Social Determinants of Health   Financial Resource Strain: Not on file  Food Insecurity: No Food Insecurity (01/04/2023)   Hunger Vital Sign    Worried About Running Out of Food in the Last Year: Never true    Ran Out of Food in the Last Year: Never true  Transportation Needs: No Transportation Needs (01/04/2023)   PRAPARE - Administrator, Civil Service (Medical): No    Lack of Transportation (Non-Medical): No  Physical Activity: Not on file   Stress: Not on file  Social Connections: Not on file  Intimate Partner Violence: Not At Risk (01/04/2023)   Humiliation, Afraid, Rape, and Kick questionnaire    Fear of Current or Ex-Partner: No    Emotionally Abused: No    Physically Abused: No    Sexually Abused: No    Family History: Family History  Problem Relation Age of Onset   Diabetes Mother    Hypertension Mother    Lung cancer Father 46   Breast cancer Sister 93   Prostate cancer Maternal Grandfather    Sickle cell anemia Daughter    Prostate cancer Cousin    Prostate cancer Cousin    Colon cancer Neg Hx     Current Medications:  Current Outpatient Medications:    acetaminophen (TYLENOL) 325 MG tablet, Take 650 mg by mouth daily as needed for mild pain, moderate pain or headache., Disp: , Rfl:    atenolol (TENORMIN) 25 MG tablet, TAKE 1 TABLET BY MOUTH EVERY DAY, Disp: 90 tablet, Rfl: 1   atorvastatin (LIPITOR) 80 MG tablet, TAKE 1 TABLET BY MOUTH EVERY DAY, Disp: 90 tablet, Rfl: 1   B-D UF III MINI PEN NEEDLES 31G X 5 MM MISC, USE FOUR TIMES DAILY AS DIRECTED, Disp: 150 each, Rfl: 2   Calcium Carb-Cholecalciferol 600-500 MG-UNIT CAPS, Take 2 capsules by mouth daily. , Disp: , Rfl:    clopidogrel (PLAVIX) 75 MG tablet, TAKE 1 TABLET BY MOUTH EVERY DAY, Disp: 90 tablet, Rfl: 1   CONTOUR TEST test strip, TEST TWICE DAILY E11.65, Disp: 200 each, Rfl: 2   darolutamide (NUBEQA) 300 MG tablet, Take 2 tablets (600 mg total) by mouth 2 (two) times daily with a meal., Disp: 120 tablet, Rfl: 1   furosemide (LASIX) 20 MG tablet, Take 20 mg by mouth 3 (three) times a week., Disp: , Rfl:    insulin degludec (TRESIBA FLEXTOUCH) 100 UNIT/ML SOPN FlexTouch Pen, Inject 30 Units into the skin at bedtime., Disp: , Rfl:    latanoprost (XALATAN) 0.005 % ophthalmic solution, Place 1 drop into both eyes at bedtime., Disp: , Rfl:    prochlorperazine (COMPAZINE) 10 MG tablet, Take 1 tablet (10 mg total) by mouth every 6 (six) hours as needed  for nausea or vomiting., Disp: 30 tablet, Rfl: 1   Semaglutide, 1 MG/DOSE, 4 MG/3ML SOPN, Inject 1 mg  as directed once a week., Disp: 6 mL, Rfl: 3   Vibegron (GEMTESA) 75 MG TABS, Take 1 tablet (75 mg total) by mouth daily., Disp: 42 tablet, Rfl: 0   Allergies: Allergies  Allergen Reactions   Zolpidem Tartrate Other (See Comments)    disorientation     REVIEW OF SYSTEMS:   Review of Systems  Constitutional:  Negative for chills, fatigue and fever.  HENT:   Negative for lump/mass, mouth sores, nosebleeds, sore throat and trouble swallowing.   Eyes:  Negative for eye problems.  Respiratory:  Negative for cough and shortness of breath.   Cardiovascular:  Negative for chest pain, leg swelling and palpitations.  Gastrointestinal:  Negative for abdominal pain, constipation, diarrhea, nausea and vomiting.  Genitourinary:  Negative for bladder incontinence, difficulty urinating, dysuria, frequency, hematuria and nocturia.   Musculoskeletal:  Negative for arthralgias, back pain, flank pain, myalgias and neck pain.  Skin:  Negative for itching and rash.  Neurological:  Negative for dizziness, headaches and numbness.  Hematological:  Does not bruise/bleed easily.  Psychiatric/Behavioral:  Negative for depression, sleep disturbance and suicidal ideas. The patient is not nervous/anxious.   All other systems reviewed and are negative.    VITALS:   Blood pressure 109/76, pulse 67, temperature 98.2 F (36.8 C), temperature source Oral, resp. rate 18, weight 251 lb (113.9 kg), SpO2 100 %.  Wt Readings from Last 3 Encounters:  02/22/23 251 lb (113.9 kg)  02/14/23 252 lb (114.3 kg)  02/08/23 250 lb 7.1 oz (113.6 kg)    Body mass index is 35.01 kg/m.  Performance status (ECOG): 1 - Symptomatic but completely ambulatory  PHYSICAL EXAM:   Physical Exam Vitals and nursing note reviewed. Exam conducted with a chaperone present.  Constitutional:      Appearance: Normal appearance.   Cardiovascular:     Rate and Rhythm: Normal rate and regular rhythm.     Pulses: Normal pulses.     Heart sounds: Normal heart sounds.  Pulmonary:     Effort: Pulmonary effort is normal.     Breath sounds: Normal breath sounds.  Abdominal:     Palpations: Abdomen is soft. There is no hepatomegaly, splenomegaly or mass.     Tenderness: There is no abdominal tenderness.  Musculoskeletal:     Right lower leg: No edema.     Left lower leg: No edema.  Lymphadenopathy:     Cervical: No cervical adenopathy.     Right cervical: No superficial, deep or posterior cervical adenopathy.    Left cervical: No superficial, deep or posterior cervical adenopathy.     Upper Body:     Right upper body: No supraclavicular or axillary adenopathy.     Left upper body: No supraclavicular or axillary adenopathy.  Neurological:     General: No focal deficit present.     Mental Status: He is alert and oriented to person, place, and time.  Psychiatric:        Mood and Affect: Mood normal.        Behavior: Behavior normal.     LABS:      Latest Ref Rng & Units 02/22/2023   10:07 AM 02/08/2023    9:45 AM 01/25/2023   10:07 AM  CBC  WBC 4.0 - 10.5 K/uL 4.1  4.9  5.9   Hemoglobin 13.0 - 17.0 g/dL 95.6  21.3  08.6   Hematocrit 39.0 - 52.0 % 34.3  34.4  34.6   Platelets 150 - 400 K/uL 252  238  218       Latest Ref Rng & Units 02/22/2023   10:07 AM 02/08/2023    9:45 AM 01/25/2023   10:07 AM  CMP  Glucose 70 - 99 mg/dL 161  096  045   BUN 8 - 23 mg/dL 26  35  30   Creatinine 0.61 - 1.24 mg/dL 4.09  8.11  9.14   Sodium 135 - 145 mmol/L 137  138  135   Potassium 3.5 - 5.1 mmol/L 3.9  4.5  4.2   Chloride 98 - 111 mmol/L 104  105  104   CO2 22 - 32 mmol/L Calcium 8.9 - 10.3 mg/dL 8.9  8.7  8.4   Total Protein 6.5 - 8.1 g/dL 6.7  6.6  6.5   Total Bilirubin 0.3 - 1.2 mg/dL 0.7  0.8  1.1   Alkaline Phos 38 - 126 U/L 90  110  109   AST 15 - 41 U/L 29  32  31   ALT 0 - 44 U/L 24  35  33       No results found for: "CEA1", "CEA" / No results found for: "CEA1", "CEA" Lab Results  Component Value Date   PSA1 1.8 12/28/2022   No results found for: "NWG956" No results found for: "CAN125"  No results found for: "TOTALPROTELP", "ALBUMINELP", "A1GS", "A2GS", "BETS", "BETA2SER", "GAMS", "MSPIKE", "SPEI" Lab Results  Component Value Date   TIBC 256 02/22/2023   TIBC 235 (L) 03/16/2020   FERRITIN 63 02/22/2023   FERRITIN 126 03/16/2020   IRONPCTSAT 27 02/22/2023   IRONPCTSAT 24 03/16/2020   No results found for: "LDH"   STUDIES:   No results found.

## 2023-02-22 ENCOUNTER — Inpatient Hospital Stay: Payer: Medicare Other

## 2023-02-22 ENCOUNTER — Inpatient Hospital Stay (HOSPITAL_BASED_OUTPATIENT_CLINIC_OR_DEPARTMENT_OTHER): Payer: Medicare Other | Admitting: Hematology

## 2023-02-22 VITALS — BP 109/76 | HR 67 | Temp 98.2°F | Resp 18 | Wt 251.0 lb

## 2023-02-22 DIAGNOSIS — N189 Chronic kidney disease, unspecified: Secondary | ICD-10-CM | POA: Diagnosis not present

## 2023-02-22 DIAGNOSIS — D649 Anemia, unspecified: Secondary | ICD-10-CM | POA: Diagnosis not present

## 2023-02-22 DIAGNOSIS — Z923 Personal history of irradiation: Secondary | ICD-10-CM | POA: Diagnosis not present

## 2023-02-22 DIAGNOSIS — E1122 Type 2 diabetes mellitus with diabetic chronic kidney disease: Secondary | ICD-10-CM | POA: Diagnosis not present

## 2023-02-22 DIAGNOSIS — C61 Malignant neoplasm of prostate: Secondary | ICD-10-CM | POA: Diagnosis not present

## 2023-02-22 DIAGNOSIS — I129 Hypertensive chronic kidney disease with stage 1 through stage 4 chronic kidney disease, or unspecified chronic kidney disease: Secondary | ICD-10-CM | POA: Diagnosis not present

## 2023-02-22 LAB — COMPREHENSIVE METABOLIC PANEL
ALT: 24 U/L (ref 0–44)
AST: 29 U/L (ref 15–41)
Albumin: 2.9 g/dL — ABNORMAL LOW (ref 3.5–5.0)
Alkaline Phosphatase: 90 U/L (ref 38–126)
Anion gap: 9 (ref 5–15)
BUN: 26 mg/dL — ABNORMAL HIGH (ref 8–23)
CO2: 24 mmol/L (ref 22–32)
Calcium: 8.9 mg/dL (ref 8.9–10.3)
Chloride: 104 mmol/L (ref 98–111)
Creatinine, Ser: 1.73 mg/dL — ABNORMAL HIGH (ref 0.61–1.24)
GFR, Estimated: 42 mL/min — ABNORMAL LOW (ref >60)
Glucose, Bld: 119 mg/dL — ABNORMAL HIGH (ref 70–99)
Potassium: 3.9 mmol/L (ref 3.5–5.1)
Sodium: 137 mmol/L (ref 135–145)
Total Bilirubin: 0.7 mg/dL (ref 0.3–1.2)
Total Protein: 6.7 g/dL (ref 6.5–8.1)

## 2023-02-22 LAB — CBC WITH DIFFERENTIAL/PLATELET
Abs Immature Granulocytes: 0.03 10*3/uL (ref 0.00–0.07)
Basophils Absolute: 0 10*3/uL (ref 0.0–0.1)
Basophils Relative: 1 %
Eosinophils Absolute: 0.2 10*3/uL (ref 0.0–0.5)
Eosinophils Relative: 4 %
HCT: 34.3 % — ABNORMAL LOW (ref 39.0–52.0)
Hemoglobin: 11.6 g/dL — ABNORMAL LOW (ref 13.0–17.0)
Immature Granulocytes: 1 %
Lymphocytes Relative: 32 %
Lymphs Abs: 1.3 10*3/uL (ref 0.7–4.0)
MCH: 30.6 pg (ref 26.0–34.0)
MCHC: 33.8 g/dL (ref 30.0–36.0)
MCV: 90.5 fL (ref 80.0–100.0)
Monocytes Absolute: 0.4 10*3/uL (ref 0.1–1.0)
Monocytes Relative: 10 %
Neutro Abs: 2.2 10*3/uL (ref 1.7–7.7)
Neutrophils Relative %: 52 %
Platelets: 252 10*3/uL (ref 150–400)
RBC: 3.79 MIL/uL — ABNORMAL LOW (ref 4.22–5.81)
RDW: 15 % (ref 11.5–15.5)
WBC: 4.1 10*3/uL (ref 4.0–10.5)
nRBC: 0 % (ref 0.0–0.2)

## 2023-02-22 LAB — FERRITIN: Ferritin: 63 ng/mL (ref 24–336)

## 2023-02-22 LAB — IRON AND TIBC
Iron: 70 ug/dL (ref 45–182)
Saturation Ratios: 27 % (ref 17.9–39.5)
TIBC: 256 ug/dL (ref 250–450)
UIBC: 186 ug/dL

## 2023-02-22 LAB — MAGNESIUM: Magnesium: 1.9 mg/dL (ref 1.7–2.4)

## 2023-02-22 NOTE — Progress Notes (Signed)
Patient is taking Nubeqa as prescribed. He has not missed any doses and reports no side effects at this time.   

## 2023-02-22 NOTE — Patient Instructions (Addendum)
[facility] Discharge Instructions   You were seen and examined today by Dr. Ellin Saba.  He reviewed the results of your lab work which are normal/stable.   Continue Nubeqa as prescribed.   We will see you back in 1 months. We will repeat lab work at that time.    Thank you for choosing  Cancer Center at [facility] to provide your oncology and hematology care.  To afford each patient quality time with our provider, please arrive at least 15 minutes before your scheduled appointment time.   If you have a lab appointment with the Cancer Center please come in thru the Main Entrance and check in at the main information desk.  You need to re-schedule your appointment should you arrive 10 or more minutes late.  We strive to give you quality time with our providers, and arriving late affects you and other patients whose appointments are after yours.  Also, if you no show three or more times for appointments you may be dismissed from the clinic at the providers discretion.     Again, thank you for choosing [facility].  Our hope is that these requests will decrease the amount of time that you wait before being seen by our physicians.       _____________________________________________________________  Should you have questions after your visit to [facility], please contact our office at [phone] and follow the prompts.  Our office hours are 8:00 a.m. and 4:30 p.m. Monday - Friday.  Please note that voicemails left after 4:00 p.m. may not be returned until the following business day.  We are closed weekends and major holidays.  You do have access to a nurse 24-7, just call the main number to the clinic [phone] and do not press any options, hold on the line and a nurse will answer the phone.    For prescription refill requests, have your pharmacy contact our office and allow 72 hours.    Due to Covid, you  will need to wear a mask upon entering the hospital. If you do not have a mask, a mask will be given to you at the Main Entrance upon arrival. For doctor visits, patients may have 1 support person age 65 or older with them. For treatment visits, patients can not have anyone with them due to social distancing guidelines and our immunocompromised population.

## 2023-03-08 ENCOUNTER — Inpatient Hospital Stay: Payer: Medicare Other

## 2023-03-08 ENCOUNTER — Inpatient Hospital Stay: Payer: Medicare Other | Admitting: Hematology

## 2023-03-14 ENCOUNTER — Telehealth: Payer: Self-pay

## 2023-03-14 NOTE — Telephone Encounter (Signed)
Patient states he is having blood work done at [facility] tomorrow. He states they will draw his PSA labs at that time.  MD follow up on 06/06.  Will cancel 05/30 lab apt once MD confirms.

## 2023-03-21 ENCOUNTER — Inpatient Hospital Stay: Payer: Medicare Other | Attending: Hematology

## 2023-03-21 DIAGNOSIS — Z801 Family history of malignant neoplasm of trachea, bronchus and lung: Secondary | ICD-10-CM | POA: Diagnosis not present

## 2023-03-21 DIAGNOSIS — D649 Anemia, unspecified: Secondary | ICD-10-CM | POA: Diagnosis not present

## 2023-03-21 DIAGNOSIS — N189 Chronic kidney disease, unspecified: Secondary | ICD-10-CM | POA: Diagnosis not present

## 2023-03-21 DIAGNOSIS — Z79899 Other long term (current) drug therapy: Secondary | ICD-10-CM | POA: Insufficient documentation

## 2023-03-21 DIAGNOSIS — Z803 Family history of malignant neoplasm of breast: Secondary | ICD-10-CM | POA: Insufficient documentation

## 2023-03-21 DIAGNOSIS — Z7902 Long term (current) use of antithrombotics/antiplatelets: Secondary | ICD-10-CM | POA: Insufficient documentation

## 2023-03-21 DIAGNOSIS — C61 Malignant neoplasm of prostate: Secondary | ICD-10-CM | POA: Insufficient documentation

## 2023-03-21 DIAGNOSIS — E1122 Type 2 diabetes mellitus with diabetic chronic kidney disease: Secondary | ICD-10-CM | POA: Diagnosis not present

## 2023-03-21 DIAGNOSIS — Z794 Long term (current) use of insulin: Secondary | ICD-10-CM | POA: Insufficient documentation

## 2023-03-21 DIAGNOSIS — I129 Hypertensive chronic kidney disease with stage 1 through stage 4 chronic kidney disease, or unspecified chronic kidney disease: Secondary | ICD-10-CM | POA: Diagnosis not present

## 2023-03-21 DIAGNOSIS — Z8042 Family history of malignant neoplasm of prostate: Secondary | ICD-10-CM | POA: Insufficient documentation

## 2023-03-21 LAB — CBC WITH DIFFERENTIAL/PLATELET
Abs Immature Granulocytes: 0.01 10*3/uL (ref 0.00–0.07)
Basophils Absolute: 0 10*3/uL (ref 0.0–0.1)
Basophils Relative: 1 %
Eosinophils Absolute: 0.1 10*3/uL (ref 0.0–0.5)
Eosinophils Relative: 2 %
HCT: 34.1 % — ABNORMAL LOW (ref 39.0–52.0)
Hemoglobin: 11.2 g/dL — ABNORMAL LOW (ref 13.0–17.0)
Immature Granulocytes: 0 %
Lymphocytes Relative: 24 %
Lymphs Abs: 1.3 10*3/uL (ref 0.7–4.0)
MCH: 30.4 pg (ref 26.0–34.0)
MCHC: 32.8 g/dL (ref 30.0–36.0)
MCV: 92.7 fL (ref 80.0–100.0)
Monocytes Absolute: 0.5 10*3/uL (ref 0.1–1.0)
Monocytes Relative: 8 %
Neutro Abs: 3.4 10*3/uL (ref 1.7–7.7)
Neutrophils Relative %: 65 %
Platelets: 205 10*3/uL (ref 150–400)
RBC: 3.68 MIL/uL — ABNORMAL LOW (ref 4.22–5.81)
RDW: 15 % (ref 11.5–15.5)
WBC: 5.3 10*3/uL (ref 4.0–10.5)
nRBC: 0 % (ref 0.0–0.2)

## 2023-03-21 LAB — COMPREHENSIVE METABOLIC PANEL
ALT: 24 U/L (ref 0–44)
AST: 28 U/L (ref 15–41)
Albumin: 2.9 g/dL — ABNORMAL LOW (ref 3.5–5.0)
Alkaline Phosphatase: 97 U/L (ref 38–126)
Anion gap: 10 (ref 5–15)
BUN: 31 mg/dL — ABNORMAL HIGH (ref 8–23)
CO2: 23 mmol/L (ref 22–32)
Calcium: 8.6 mg/dL — ABNORMAL LOW (ref 8.9–10.3)
Chloride: 105 mmol/L (ref 98–111)
Creatinine, Ser: 1.67 mg/dL — ABNORMAL HIGH (ref 0.61–1.24)
GFR, Estimated: 44 mL/min — ABNORMAL LOW (ref >60)
Glucose, Bld: 119 mg/dL — ABNORMAL HIGH (ref 70–99)
Potassium: 4 mmol/L (ref 3.5–5.1)
Sodium: 138 mmol/L (ref 135–145)
Total Bilirubin: 0.9 mg/dL (ref 0.3–1.2)
Total Protein: 6.6 g/dL (ref 6.5–8.1)

## 2023-03-21 LAB — MAGNESIUM: Magnesium: 1.9 mg/dL (ref 1.7–2.4)

## 2023-03-21 NOTE — Progress Notes (Signed)
[facility] [address] [place] [address]    Clinic Day:  03/22/2023  Referring physician: Benita Stabile, MD  Patient Care Team: Benita Stabile, MD as PCP - General (Internal Medicine) Erroll Luna, [facility] as Pharmacist (Pharmacist) Lanelle Bal, DO as Consulting Physician (Internal Medicine) Lanelle Bal, DO as Consulting Physician (Gastroenterology) Doreatha Massed, MD as Medical Oncologist (Medical Oncology) Therese Sarah, RN as Oncology Nurse Navigator (Medical Oncology)   ASSESSMENT & PLAN:   Assessment: 1.  Castration refractory prostate cancer, T3 N1 M0: - 04/27/2017: Prostatic adenocarcinoma Gleason 4+5=9 involving both lobes, PSA 9.8 - Status post long-term ADT plus definitive EBRT from 08/27/2017 through 10/26/2017 - History of radiation-induced cystitis and proctitis - Patient on Eligard every 6 months since 03/19/2020 - PSA <0.1 (09/01/2021), 0.2 (03/09/2022), 0.6 (07/05/2022), 0.9 (09/19/2022), 1.8 (2/29/2022) - PSMA PET (12/14/2022): Marked uptake in the prostate gland compatible with residual/recurrent prostate cancer.  No evidence of nodal or distant metastatic disease.  No focal bone mets. - Darolutamide 600 mg twice daily started on 01/16/2023. - Invitae germline: MSH 3 heterozygous VUS.  No pathogenic variants detected.   2.  Social/family history: - Lives at home with his wife.  Independent of ADLs and IADLs.  He had right BKA and left AKA from diabetes and has prosthesis.  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  He is a non-smoker. - Father had lung cancer.  Maternal grand father had prostate cancer.  Sister had breast cancer.    Plan: 1.  M0 Castrate resistant prostate cancer: - Darolutamide was started around 01/16/2023.  Last Eligard 45 mg on 09/28/2022. - Cramps on the sides of the chest wall and abdomen are improving. - We reviewed labs from 03/19/2023: Normal LFTs.  Creatinine 1.67 and stable.   CBC grossly normal with anemia. - PSA-0.74 (02/08/2023), 1.8 (12/28/2022). - Continue darolutamide 600 mg twice daily.  RTC 2 months with repeat CBC, CMP, PSA.   2.  Bone health: - We have obtained and reviewed DEXA scan from [facility] done on 06/22/2022.  T-score -0.4, considered normal. - Continue calcium and vitamin D supplements.   3.  Normocytic anemia: - Combination anemia from functional iron deficiency and CKD. - Hemoglobin 11.2.  Ferritin is 63 and percent saturation 27.  He complains of feeling tired. - Recommend Monoferric 1 g IV x 1. - We discussed side effects including rare chance of anaphylactic reactions. - Will plan on repeating ferritin and iron panel in 2 months.    Orders Placed This Encounter  Procedures   CBC with Differential    Standing Status:   Future    Standing Expiration Date:   03/21/2024   Comprehensive metabolic panel    Standing Status:   Future    Standing Expiration Date:   03/21/2024   PSA    Standing Status:   Future    Standing Expiration Date:   03/21/2024   Iron and TIBC ([facility] ONLY)    Standing Status:   Future    Standing Expiration Date:   03/21/2024   Ferritin    Standing Status:   Future    Standing Expiration Date:   03/21/2024      I,Katie Daubenspeck,acting as a scribe for Doreatha Massed, MD.,have documented all relevant documentation on the behalf of Doreatha Massed, MD,as directed by  Doreatha Massed, MD while in the presence of Doreatha Massed, MD.   I,  Doreatha Massed MD, have reviewed the above documentation for accuracy and completeness, and I agree with the above.   Doreatha Massed, MD   5/23/20245:19 PM  CHIEF COMPLAINT:   Diagnosis: prostate cancer    Cancer Staging  Prostate cancer [facility]) Staging form: Prostate, AJCC 8th Edition - Clinical: Stage IVA (cT1c, cN1, cM0, PSA: 9.8, Grade Group: 5) - Signed by Margaretmary Dys, MD on 06/09/2017    Prior Therapy:  radiation therapy 10/29-12/28/2018   Current Therapy:  Leurpolide injections and Darolutamide    HISTORY OF PRESENT ILLNESS:   Oncology History   No history exists.     INTERVAL HISTORY:   [name] is a 72 y.o. male presenting to clinic today for follow up of prostate cancer. He was last seen by me on 02/22/23.  Today, he states that he is doing well overall. His appetite level is at 100%. His energy level is at 50%.  PAST MEDICAL HISTORY:   Past Medical History: Past Medical History:  Diagnosis Date   Arthritis    Cerebrovascular disease    MRI shows Right carotid inferior cavernours narrowing 75% and  50-75% stenosis of Cavernous and supraclinoi right side   CKD (chronic kidney disease) 06/28/2014   Sees Dr Lowell Guitar   Diabetic retinopathy [facility])    Hyperlipidemia    Hypertension    Myocardial infarction [facility])    "mild" heart attack   Necrosis (HCC)    #2 nail    Pneumonia    as a child   Poor circulation of extremity    Prostate cancer (HCC) 2017   Prostate   Sleep apnea    uses cpap, getting a new one   Type 2 Diabetes mellitus    Type 2    Surgical History: Past Surgical History:  Procedure Laterality Date   ABOVE KNEE LEG AMPUTATION Left 2004   started out below knee and then extended to above knee due to poor healing   AMPUTATION Right 04/28/2015   Procedure: AMPUTATION RAY, RIGHT 5TH TOE;  Surgeon: Eldred Manges, MD;  Location: [facility] OR;  Service: Orthopedics;  Laterality: Right;   AMPUTATION Right 05/10/2015   Procedure: Right Below Knee Amputation;  Surgeon: Eldred Manges, MD;  [facility] OR;  Service: Orthopedics;  Laterality: Right;   CATARACT EXTRACTION W/PHACO  09/05/2012   Procedure: CATARACT EXTRACTION PHACO AND INTRAOCULAR LENS PLACEMENT (IOC);  Surgeon: Gemma Payor, MD;  Location: [facility] ORS;  Service: Ophthalmology;  Laterality: Left;  CDE=5.45   CATARACT EXTRACTION W/PHACO  10/03/2012   Procedure: CATARACT EXTRACTION PHACO AND INTRAOCULAR LENS PLACEMENT  (IOC);  Surgeon: Gemma Payor, MD;  Location: [facility] ORS;  Service: Ophthalmology;  Laterality: Right;  CDE: 12.31   COLONOSCOPY  2011   Dr. Arlyce Dice: colon polyps, tubular adenoma   COLONOSCOPY N/A 11/01/2018   Dr. Jena Gauss: Blood noted in the rectal vault.  Vascular pattern of the rectum abnormal, neovascular changes distally and actively oozing.  There were 3 2 to 5 mm polyps in the splenic flexure, cecum removed.  Cecal polyp was not recovered.  Radiation proctitis status post APC treatment.  The splenic flexure polyps were tubular adenomas.  Next colonoscopy in 5 years.   FLEXIBLE SIGMOIDOSCOPY N/A 10/27/2019   Procedure: FLEXIBLE SIGMOIDOSCOPY;  Surgeon: West Bali, MD; rectal bleeding due to radiation proctitis s/p APC therapy (actively bleeding during exam), rectosigmoid colon and sigmoid colon appeared normal.   FLEXIBLE SIGMOIDOSCOPY N/A 03/13/2022   Procedure: FLEXIBLE SIGMOIDOSCOPY;  Surgeon: Lanelle Bal, DO;  Location: [facility]ITE;  Service: Endoscopy;  Laterality: N/A;  1:45pm   HOT HEMOSTASIS  03/13/2022   Procedure: HOT HEMOSTASIS (ARGON PLASMA COAGULATION/BICAP);  Surgeon: Lanelle Bal, DO;  Location: [facility]ITE;  Service: Endoscopy;;   POLYPECTOMY  11/01/2018   Procedure: POLYPECTOMY;  Surgeon: Corbin Ade, MD;  Location: [facility];  Service: Endoscopy;;  cecum,splenic flexure   REFRACTIVE SURGERY Left 08/01/2021   Cleaned cataract    Social History: Social History   Socioeconomic History   Marital status: Married    Spouse name: Not on file   Number of children: 2   Years of education: college   Highest education level: Not on file  Occupational History   Occupation: Engineer, materials: [employer]  Tobacco Use   Smoking status: Never    Passive exposure: Never   Smokeless tobacco: Never  Vaping Use   Vaping Use: Never used  Substance and Sexual Activity   Alcohol use: No   Drug use: No   Sexual activity: Not Currently  Other  Topics Concern   Not on file  Social History Narrative   Patient lives with his wife    Patient right handed   Patient drinks caffine on occ.   Social Determinants of Health   Financial Resource Strain: Not on file  Food Insecurity: No Food Insecurity (01/04/2023)   Hunger Vital Sign    Worried About Running Out of Food in the Last Year: Never true    Ran Out of Food in the Last Year: Never true  Transportation Needs: No Transportation Needs (01/04/2023)   PRAPARE - Administrator, Civil Service (Medical): No    Lack of Transportation (Non-Medical): No  Physical Activity: Not on file  Stress: Not on file  Social Connections: Not on file  Intimate Partner Violence: Not At Risk (01/04/2023)   Humiliation, Afraid, Rape, and Kick questionnaire    Fear of Current or Ex-Partner: No    Emotionally Abused: No    Physically Abused: No    Sexually Abused: No    Family History: Family History  Problem Relation Age of Onset   Diabetes Mother    Hypertension Mother    Lung cancer Father 43   Breast cancer Sister 66   Prostate cancer Maternal Grandfather    Sickle cell anemia Daughter    Prostate cancer Cousin    Prostate cancer Cousin    Colon cancer Neg Hx     Current Medications:  Current Outpatient Medications:    acetaminophen (TYLENOL) 325 MG tablet, Take 650 mg by mouth daily as needed for mild pain, moderate pain or headache., Disp: , Rfl:    atenolol (TENORMIN) 25 MG tablet, TAKE 1 TABLET BY MOUTH EVERY DAY, Disp: 90 tablet, Rfl: 1   atorvastatin (LIPITOR) 80 MG tablet, TAKE 1 TABLET BY MOUTH EVERY DAY, Disp: 90 tablet, Rfl: 1   B-D UF III MINI PEN NEEDLES 31G X 5 MM MISC, USE FOUR TIMES DAILY AS DIRECTED, Disp: 150 each, Rfl: 2   Calcium Carb-Cholecalciferol 600-500 MG-UNIT CAPS, Take 2 capsules by mouth daily. , Disp: , Rfl:    clopidogrel (PLAVIX) 75 MG tablet, TAKE 1 TABLET BY MOUTH EVERY DAY, Disp: 90 tablet, Rfl: 1   CONTOUR TEST test strip, TEST TWICE DAILY  E11.65, Disp: 200 each, Rfl: 2   darolutamide (NUBEQA) 300 MG tablet, Take 2 tablets (600 mg total) by mouth 2 (two) times daily with a  meal., Disp: 120 tablet, Rfl: 1   furosemide (LASIX) 20 MG tablet, Take 20 mg by mouth 3 (three) times a week., Disp: , Rfl:    insulin degludec (TRESIBA FLEXTOUCH) 100 UNIT/ML SOPN FlexTouch Pen, Inject 30 Units into the skin at bedtime., Disp: , Rfl:    latanoprost (XALATAN) 0.005 % ophthalmic solution, Place 1 drop into both eyes at bedtime., Disp: , Rfl:    prochlorperazine (COMPAZINE) 10 MG tablet, Take 1 tablet (10 mg total) by mouth every 6 (six) hours as needed for nausea or vomiting., Disp: 30 tablet, Rfl: 1   Semaglutide, 1 MG/DOSE, 4 MG/3ML SOPN, Inject 1 mg as directed once a week., Disp: 6 mL, Rfl: 3   Vibegron (GEMTESA) 75 MG TABS, Take 1 tablet (75 mg total) by mouth daily., Disp: 42 tablet, Rfl: 0   Allergies: Allergies  Allergen Reactions   Zolpidem Tartrate Other (See Comments)    disorientation     REVIEW OF SYSTEMS:   Review of Systems  Constitutional:  Negative for chills, fatigue and fever.  HENT:   Negative for lump/mass, mouth sores, nosebleeds, sore throat and trouble swallowing.   Eyes:  Negative for eye problems.  Respiratory:  Negative for cough and shortness of breath.   Cardiovascular:  Negative for chest pain, leg swelling and palpitations.  Gastrointestinal:  Negative for abdominal pain, constipation, diarrhea, nausea and vomiting.  Genitourinary:  Negative for bladder incontinence, difficulty urinating, dysuria, frequency, hematuria and nocturia.   Musculoskeletal:  Negative for arthralgias, back pain, flank pain, myalgias and neck pain.  Skin:  Negative for itching and rash.  Neurological:  Positive for numbness. Negative for dizziness and headaches.  Hematological:  Does not bruise/bleed easily.  Psychiatric/Behavioral:  Negative for depression, sleep disturbance and suicidal ideas. The patient is not nervous/anxious.    All other systems reviewed and are negative.    VITALS:   Blood pressure 113/75, pulse 60, temperature 97.9 F (36.6 C), temperature source Oral, resp. rate 18, SpO2 98 %.  Wt Readings from Last 3 Encounters:  02/22/23 251 lb (113.9 kg)  02/14/23 252 lb (114.3 kg)  02/08/23 250 lb 7.1 oz (113.6 kg)    There is no height or weight on file to calculate BMI.  Performance status (ECOG): 1 - Symptomatic but completely ambulatory  PHYSICAL EXAM:   Physical Exam Vitals and nursing note reviewed. Exam conducted with a chaperone present.  Constitutional:      Appearance: Normal appearance.  Cardiovascular:     Rate and Rhythm: Normal rate and regular rhythm.     Pulses: Normal pulses.     Heart sounds: Normal heart sounds.  Pulmonary:     Effort: Pulmonary effort is normal.     Breath sounds: Normal breath sounds.  Abdominal:     Palpations: Abdomen is soft. There is no hepatomegaly, splenomegaly or mass.     Tenderness: There is no abdominal tenderness.  Lymphadenopathy:     Cervical: No cervical adenopathy.     Right cervical: No superficial, deep or posterior cervical adenopathy.    Left cervical: No superficial, deep or posterior cervical adenopathy.     Upper Body:     Right upper body: No supraclavicular or axillary adenopathy.     Left upper body: No supraclavicular or axillary adenopathy.  Neurological:     General: No focal deficit present.     Mental Status: He is alert and oriented to person, place, and time.  Psychiatric:  Mood and Affect: Mood normal.        Behavior: Behavior normal.     LABS:      Latest Ref Rng & Units 03/21/2023    9:09 AM 02/22/2023   10:07 AM 02/08/2023    9:45 AM  CBC  WBC 4.0 - 10.5 K/uL 5.3  4.1  4.9   Hemoglobin 13.0 - 17.0 g/dL 16.1  09.6  04.5   Hematocrit 39.0 - 52.0 % 34.1  34.3  34.4   Platelets 150 - 400 K/uL 205  252  238       Latest Ref Rng & Units 03/21/2023    9:09 AM 02/22/2023   10:07 AM 02/08/2023    9:45  AM  CMP  Glucose 70 - 99 mg/dL 409  811  914   BUN 8 - 23 mg/dL 31  26  35   Creatinine 0.61 - 1.24 mg/dL 7.82  9.56  2.13   Sodium 135 - 145 mmol/L 138  137  138   Potassium 3.5 - 5.1 mmol/L 4.0  3.9  4.5   Chloride 98 - 111 mmol/L 105  104  105   CO2 22 - 32 mmol/L 23  24  25    Calcium 8.9 - 10.3 mg/dL 8.6  8.9  8.7   Total Protein 6.5 - 8.1 g/dL 6.6  6.7  6.6   Total Bilirubin 0.3 - 1.2 mg/dL 0.9  0.7  0.8   Alkaline Phos 38 - 126 U/L 97  90  110   AST 15 - 41 U/L 28  29  32   ALT 0 - 44 U/L 24  24  35      No results found for: "CEA1", "CEA" / No results found for: "CEA1", "CEA" Lab Results  Component Value Date   PSA1 1.8 12/28/2022   No results found for: "YQM578" No results found for: "CAN125"  No results found for: "TOTALPROTELP", "ALBUMINELP", "A1GS", "A2GS", "BETS", "BETA2SER", "GAMS", "MSPIKE", "SPEI" Lab Results  Component Value Date   TIBC 256 02/22/2023   TIBC 235 (L) 03/16/2020   FERRITIN 63 02/22/2023   FERRITIN 126 03/16/2020   IRONPCTSAT 27 02/22/2023   IRONPCTSAT 24 03/16/2020   No results found for: "LDH"   STUDIES:   No results found.

## 2023-03-22 ENCOUNTER — Inpatient Hospital Stay (HOSPITAL_BASED_OUTPATIENT_CLINIC_OR_DEPARTMENT_OTHER): Payer: Medicare Other | Admitting: Hematology

## 2023-03-22 ENCOUNTER — Inpatient Hospital Stay: Payer: Medicare Other

## 2023-03-22 VITALS — BP 113/75 | HR 60 | Temp 97.9°F | Resp 18

## 2023-03-22 DIAGNOSIS — D649 Anemia, unspecified: Secondary | ICD-10-CM | POA: Diagnosis not present

## 2023-03-22 DIAGNOSIS — C61 Malignant neoplasm of prostate: Secondary | ICD-10-CM | POA: Diagnosis not present

## 2023-03-22 DIAGNOSIS — D509 Iron deficiency anemia, unspecified: Secondary | ICD-10-CM | POA: Insufficient documentation

## 2023-03-22 DIAGNOSIS — E1122 Type 2 diabetes mellitus with diabetic chronic kidney disease: Secondary | ICD-10-CM | POA: Diagnosis not present

## 2023-03-22 DIAGNOSIS — I129 Hypertensive chronic kidney disease with stage 1 through stage 4 chronic kidney disease, or unspecified chronic kidney disease: Secondary | ICD-10-CM | POA: Diagnosis not present

## 2023-03-22 DIAGNOSIS — Z7902 Long term (current) use of antithrombotics/antiplatelets: Secondary | ICD-10-CM | POA: Diagnosis not present

## 2023-03-22 DIAGNOSIS — N189 Chronic kidney disease, unspecified: Secondary | ICD-10-CM | POA: Diagnosis not present

## 2023-03-22 NOTE — Patient Instructions (Addendum)
[facility] Discharge Instructions   You were seen and examined today by Dr. Ellin Saba.  He reviewed the results of your lab work which are normal/stable.   We will arrange for you to have an iron infusion.   Continue Nubeqa as prescribed.   Follow up with Dr. Annabell Howells as scheduled for Eligard injection.   Return as scheduled.    Thank you for choosing [facility] at [facility] to provide your oncology and hematology care.  To afford each patient quality time with our provider, please arrive at least 15 minutes before your scheduled appointment time.   If you have a lab appointment with the Cancer Center please come in thru the Main Entrance and check in at the main information desk.  You need to re-schedule your appointment should you arrive 10 or more minutes late.  We strive to give you quality time with our providers, and arriving late affects you and other patients whose appointments are after yours.  Also, if you no show three or more times for appointments you may be dismissed from the clinic at the providers discretion.     Again, thank you for choosing [facility].  Our hope is that these requests will decrease the amount of time that you wait before being seen by our physicians.       _____________________________________________________________  Should you have questions after your visit to [facility], please contact our office at [phone] and follow the prompts.  Our office hours are 8:00 a.m. and 4:30 p.m. Monday - Friday.  Please note that voicemails left after 4:00 p.m. may not be returned until the following business day.  We are closed weekends and major holidays.  You do have access to a nurse 24-7, just call the main number to the clinic [phone] and do not press any options, hold on the line and a nurse will answer the phone.    For prescription refill requests, have your  pharmacy contact our office and allow 72 hours.    Due to Covid, you will need to wear a mask upon entering the hospital. If you do not have a mask, a mask will be given to you at the Main Entrance upon arrival. For doctor visits, patients may have 1 support person age 63 or older with them. For treatment visits, patients can not have anyone with them due to social distancing guidelines and our immunocompromised population.

## 2023-03-29 ENCOUNTER — Other Ambulatory Visit: Payer: Medicare Other

## 2023-03-29 DIAGNOSIS — C61 Malignant neoplasm of prostate: Secondary | ICD-10-CM | POA: Diagnosis not present

## 2023-04-02 ENCOUNTER — Inpatient Hospital Stay: Payer: Medicare Other | Attending: Hematology

## 2023-04-02 VITALS — BP 124/80 | HR 59 | Temp 96.6°F | Resp 18

## 2023-04-02 DIAGNOSIS — D649 Anemia, unspecified: Secondary | ICD-10-CM | POA: Diagnosis not present

## 2023-04-02 DIAGNOSIS — Z79899 Other long term (current) drug therapy: Secondary | ICD-10-CM | POA: Insufficient documentation

## 2023-04-02 DIAGNOSIS — D509 Iron deficiency anemia, unspecified: Secondary | ICD-10-CM

## 2023-04-02 DIAGNOSIS — C61 Malignant neoplasm of prostate: Secondary | ICD-10-CM | POA: Diagnosis not present

## 2023-04-02 DIAGNOSIS — N182 Chronic kidney disease, stage 2 (mild): Secondary | ICD-10-CM

## 2023-04-02 MED ORDER — SODIUM CHLORIDE 0.9 % IV SOLN
1000.0000 mg | Freq: Once | INTRAVENOUS | Status: AC
  Administered 2023-04-02: 1000 mg via INTRAVENOUS
  Filled 2023-04-02: qty 10

## 2023-04-02 MED ORDER — SODIUM CHLORIDE 0.9 % IV SOLN: Freq: Once | INTRAVENOUS | Status: AC

## 2023-04-02 NOTE — Progress Notes (Signed)
Patient tolerated iron infusion with no complaints voiced.  Peripheral IV site clean and dry with good blood return noted before and after infusion.  Band aid applied.  VSS with discharge and left in satisfactory condition with no s/s of distress noted.   

## 2023-04-02 NOTE — Patient Instructions (Signed)
[facility]  Discharge Instructions: Thank you for choosing [facility] to provide your oncology and hematology care.  If you have a lab appointment with the Cancer Center - please note that after April 8th, 2024, all labs will be drawn in the cancer center.  You do not have to check in or register with the main entrance as you have in the past but will complete your check-in in the cancer center.  Wear comfortable clothing and clothing appropriate for easy access to any Portacath or PICC line.   We strive to give you quality time with your provider. You may need to reschedule your appointment if you arrive late (15 or more minutes).  Arriving late affects you and other patients whose appointments are after yours.  Also, if you miss three or more appointments without notifying the office, you may be dismissed from the clinic at the provider's discretion.      For prescription refill requests, have your pharmacy contact our office and allow 72 hours for refills to be completed.    Today you received the following Monoferric.   Iron Sucrose Injection What is this medication? IRON SUCROSE (EYE ern SOO krose) treats low levels of iron (iron deficiency anemia) in people with kidney disease. Iron is a mineral that plays an important role in making red blood cells, which carry oxygen from your lungs to the rest of your body. This medicine may be used for other purposes; ask your health care provider or pharmacist if you have questions. COMMON BRAND NAME(S): Venofer What should I tell my care team before I take this medication? They need to know if you have any of these conditions: Anemia not caused by low iron levels Heart disease High levels of iron in the blood Kidney disease Liver disease An unusual or allergic reaction to iron, other medications, foods, dyes, or preservatives Pregnant or trying to get pregnant Breastfeeding How should I use this  medication? This medication is for infusion into a vein. It is given in a hospital or clinic setting. Talk to your care team about the use of this medication in children. While this medication may be prescribed for children as young as 2 years for selected conditions, precautions do apply. Overdosage: If you think you have taken too much of this medicine contact a poison control center or emergency room at once. NOTE: This medicine is only for you. Do not share this medicine with others. What if I miss a dose? Keep appointments for follow-up doses. It is important not to miss your dose. Call your care team if you are unable to keep an appointment. What may interact with this medication? Do not take this medication with any of the following: Deferoxamine Dimercaprol Other iron products This medication may also interact with the following: Chloramphenicol Deferasirox This list may not describe all possible interactions. Give your health care provider a list of all the medicines, herbs, non-prescription drugs, or dietary supplements you use. Also tell them if you smoke, drink alcohol, or use illegal drugs. Some items may interact with your medicine. What should I watch for while using this medication? Visit your care team regularly. Tell your care team if your symptoms do not start to get better or if they get worse. You may need blood work done while you are taking this medication. You may need to follow a special diet. Talk to your care team. Foods that contain iron include: whole grains/cereals, dried fruits, beans,  or peas, leafy green vegetables, and organ meats (liver, kidney). What side effects may I notice from receiving this medication? Side effects that you should report to your care team as soon as possible: Allergic reactions--skin rash, itching, hives, swelling of the face, lips, tongue, or throat Low blood pressure--dizziness, feeling faint or lightheaded, blurry vision Shortness of  breath Side effects that usually do not require medical attention (report to your care team if they continue or are bothersome): Flushing Headache Joint pain Muscle pain Nausea Pain, redness, or irritation at injection site This list may not describe all possible side effects. Call your doctor for medical advice about side effects. You may report side effects to FDA at 1-800-FDA-1088. Where should I keep my medication? This medication is given in a hospital or clinic and will not be stored at home. NOTE: This sheet is a summary. It may not cover all possible information. If you have questions about this medicine, talk to your doctor, pharmacist, or health care provider.  2024 Elsevier/Gold Standard (2022-04-26 00:00:00)       To help prevent nausea and vomiting after your treatment, we encourage you to take your nausea medication as directed.  BELOW ARE SYMPTOMS THAT SHOULD BE REPORTED IMMEDIATELY: *FEVER GREATER THAN 100.4 F (38 C) OR HIGHER *CHILLS OR SWEATING *NAUSEA AND VOMITING THAT IS NOT CONTROLLED WITH YOUR NAUSEA MEDICATION *UNUSUAL SHORTNESS OF BREATH *UNUSUAL BRUISING OR BLEEDING *URINARY PROBLEMS (pain or burning when urinating, or frequent urination) *BOWEL PROBLEMS (unusual diarrhea, constipation, pain near the anus) TENDERNESS IN MOUTH AND THROAT WITH OR WITHOUT PRESENCE OF ULCERS (sore throat, sores in mouth, or a toothache) UNUSUAL RASH, SWELLING OR PAIN  UNUSUAL VAGINAL DISCHARGE OR ITCHING   Items with * indicate a potential emergency and should be followed up as soon as possible or go to the Emergency Department if any problems should occur.  Please show the CHEMOTHERAPY ALERT CARD or IMMUNOTHERAPY ALERT CARD at check-in to the Emergency Department and triage nurse.  Should you have questions after your visit or need to cancel or reschedule your appointment, please contact [facility] [phone]  and follow the prompts.  Office hours  are 8:00 a.m. to 4:30 p.m. Monday - Friday. Please note that voicemails left after 4:00 p.m. may not be returned until the following business day.  We are closed weekends and major holidays. You have access to a nurse at all times for urgent questions. Please call the main number to the clinic [phone] and follow the prompts.  For any non-urgent questions, you may also contact your provider using MyChart. We now offer e-Visits for anyone 69 and older to request care online for non-urgent symptoms. For details visit [email or web address].   Also download the MyChart app! Go to the app store, search "MyChart", open the app, select [place], and log in with your MyChart username and password.

## 2023-04-05 ENCOUNTER — Ambulatory Visit (INDEPENDENT_AMBULATORY_CARE_PROVIDER_SITE_OTHER): Payer: Medicare Other | Admitting: Urology

## 2023-04-05 VITALS — BP 115/68 | HR 94

## 2023-04-05 DIAGNOSIS — Z87440 Personal history of urinary (tract) infections: Secondary | ICD-10-CM

## 2023-04-05 DIAGNOSIS — R351 Nocturia: Secondary | ICD-10-CM

## 2023-04-05 DIAGNOSIS — N3941 Urge incontinence: Secondary | ICD-10-CM

## 2023-04-05 DIAGNOSIS — Z192 Hormone resistant malignancy status: Secondary | ICD-10-CM

## 2023-04-05 DIAGNOSIS — R9721 Rising PSA following treatment for malignant neoplasm of prostate: Secondary | ICD-10-CM | POA: Diagnosis not present

## 2023-04-05 DIAGNOSIS — C61 Malignant neoplasm of prostate: Secondary | ICD-10-CM | POA: Diagnosis not present

## 2023-04-05 LAB — URINALYSIS, ROUTINE W REFLEX MICROSCOPIC
Bilirubin, UA: NEGATIVE
Glucose, UA: NEGATIVE
Ketones, UA: NEGATIVE
Leukocytes,UA: NEGATIVE
Nitrite, UA: NEGATIVE
Protein,UA: NEGATIVE
RBC, UA: NEGATIVE
Specific Gravity, UA: 1.01 (ref 1.005–1.030)
Urobilinogen, Ur: 0.2 mg/dL (ref 0.2–1.0)
pH, UA: 6 (ref 5.0–7.5)

## 2023-04-05 MED ORDER — GEMTESA 75 MG PO TABS: 75.0000 mg | ORAL_TABLET | Freq: Every day | ORAL | 11 refills | Status: AC

## 2023-04-05 MED ORDER — GEMTESA 75 MG PO TABS
75.0000 mg | ORAL_TABLET | Freq: Every day | ORAL | 0 refills | Status: DC
Start: 2023-04-05 — End: 2023-06-06

## 2023-04-05 MED ORDER — LEUPROLIDE ACETATE (6 MONTH) 45 MG ~~LOC~~ KIT
45.0000 mg | PACK | Freq: Once | SUBCUTANEOUS | Status: AC
Start: 2023-04-05 — End: 2023-04-05
  Administered 2023-04-05: 45 mg via SUBCUTANEOUS

## 2023-04-05 NOTE — Progress Notes (Signed)
Eligard SubQ Injection   Due to Prostate Cancer patient is present today for a Eligard Injection.  Medication: Eligard 6 month Dose: 45 mg  Location: left  Lot: [id number] Exp: 03/29/2024  Patient tolerated well, no complications were noted  Performed by: Kennyth Lose, CMA

## 2023-04-05 NOTE — Progress Notes (Signed)
Subjective:  1. Rising PSA following treatment for malignant neoplasm of prostate   2. 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 Urge incontinence   4. Personal history of urinary infection   5. Prostate cancer (HCC)   6. Nocturia     04/05/23: [name] returns today in f/u for the history below.  His PSA was up to 1.8 in 2/24 but it is now back to 0.74 after starting darolutamide on 01/16/23.   He is doing well on the med.  He had some fatigue but is better after getting an iron infusion for anemia.   He has no weight loss.  He has no bone pain but he does have some back pain.  His IPSS is 4.  He has no GI complaints.    His labs on 03/21/23 showed a Hgb of 11.2 and a Cr of 1.67 which were stable.  LFT's were normal.  He did better with the Gemtesa than the Mybetriq for the UUI.  His UA is clear.   12/28/22: [name] returns today in f/u for the history below.   His PSA has been rising on Eligard but he didn't get a PSA prior to this visit but had a PSMA PET on 12/14/22 that showed only prostate uptake and no distant disease.  He has been on Eligard with the last dose on 09/28/22.  His IPSS is 9 with some increased frequency and near UUI on Myrbetriq 25mg .   He has no bone pain or weight loss.  He has no GI complaints.   HIs UA today is concerning for infection.   09/28/22: [name] returns today in f/u for his history of prostate cancer.  His PSA has been rising over the past year from <0.1 on 09/01/21 to 0.2 on 03/09/22, 0.6 on 07/05/22 and 0.9 on 09/19/22.  His last testosterone level was 5 on 07/05/22.  He has been on Eligard with the last dose on 03/16/22.  He had no evidence of mets on his CT in May.  He remains on Myrbetriq 25mg  qd for OAB.  He has CKD3b. He has had no weight loss.  He has had no bone pain.  He is voiding ok with no incontinence but some persistent urgency.  He had hematuria 4-5 months ago but none since.   03/30/22: [name] returns today in f/u for cystoscopy.   The CT hematuria study showed mild bladder wall thickening.    03/16/22: [name] is a 72yo here for followup for a history of T3 N1 M0 Gleason 9 prostate cancer with right external iliac nodal disease at diagnosis in 2018 and urge incontinence. He had radiation in 2018.  He has mild LUTS and mild urgency with mirabegron 25mg  every other day.  He has nocturia x 1.  He has had no hematuria but his UA today has 3-10 RBC's. PSA is 0.2  and his Testosterone is 10 in 5/22. He is due for eligard today.  UA is clear today.   He has no weight loss or bone pain and he has rare hot flashes.  He had endoscopy on Monday for rectal bleeding from radiation cystitis and had some fulguration.  His Cr is 1.7 with a GFR of 43.   He has not had any abdominal imaging since 2018.     IPSS     Row Name 04/05/23 1000         International Prostate Symptom Score   How often have you had the sensation  of not emptying your bladder? Not at All     How often have you had to urinate less than every two hours? Less than 1 in 5 times     How often have you found you stopped and started again several times when you urinated? Less than 1 in 5 times     How often have you found it difficult to postpone urination? Less than 1 in 5 times     How often have you had a weak urinary stream? Not at All     How often have you had to strain to start urination? Not at All     How many times did you typically get up at night to urinate? 1 Time     Total IPSS Score 4       Quality of Life due to urinary symptoms   If you were to spend the rest of your life with your urinary condition just the way it is now how would you feel about that? Pleased                  ROS:  ROS:  A complete review of systems was performed.  All systems are negative except for pertinent findings as noted.   Review of Systems  Constitutional:  Positive for malaise/fatigue.  Genitourinary:  Positive for frequency and urgency.  Musculoskeletal:   Positive for back pain and joint pain.  All other systems reviewed and are negative.   Allergies  Allergen Reactions   Zolpidem Tartrate Other (See Comments)    disorientation     Outpatient Encounter Medications as of 04/05/2023  Medication Sig   Vibegron (GEMTESA) 75 MG TABS Take 1 tablet (75 mg total) by mouth daily.   acetaminophen (TYLENOL) 325 MG tablet Take 650 mg by mouth daily as needed for mild pain, moderate pain or headache.   atenolol (TENORMIN) 25 MG tablet TAKE 1 TABLET BY MOUTH EVERY DAY   atorvastatin (LIPITOR) 80 MG tablet TAKE 1 TABLET BY MOUTH EVERY DAY   B-D UF III MINI PEN NEEDLES 31G X 5 MM MISC USE FOUR TIMES DAILY AS DIRECTED   Calcium Carb-Cholecalciferol 600-500 MG-UNIT CAPS Take 2 capsules by mouth daily.    clopidogrel (PLAVIX) 75 MG tablet TAKE 1 TABLET BY MOUTH EVERY DAY   CONTOUR TEST test strip TEST TWICE DAILY E11.65   darolutamide (NUBEQA) 300 MG tablet Take 2 tablets (600 mg total) by mouth 2 (two) times daily with a meal.   furosemide (LASIX) 20 MG tablet Take 20 mg by mouth 3 (three) times a week.   insulin degludec (TRESIBA FLEXTOUCH) 100 UNIT/ML SOPN FlexTouch Pen Inject 30 Units into the skin at bedtime.   latanoprost (XALATAN) 0.005 % ophthalmic solution Place 1 drop into both eyes at bedtime.   prochlorperazine (COMPAZINE) 10 MG tablet Take 1 tablet (10 mg total) by mouth every 6 (six) hours as needed for nausea or vomiting.   Semaglutide, 1 MG/DOSE, 4 MG/3ML SOPN Inject 1 mg as directed once a week.   Vibegron (GEMTESA) 75 MG TABS Take 1 tablet (75 mg total) by mouth daily.   [DISCONTINUED] Vibegron (GEMTESA) 75 MG TABS Take 1 tablet (75 mg total) by mouth daily.   [EXPIRED] leuprolide (6 Month) (ELIGARD) injection 45 mg    No facility-administered encounter medications on file as of 04/05/2023.    Past Medical History:  Diagnosis Date   Arthritis    Cerebrovascular disease    MRI shows Right carotid  inferior cavernours narrowing 75% and   50-75% stenosis of Cavernous and supraclinoi right side   CKD (chronic kidney disease) 06/28/2014   Sees Dr Lowell Guitar   Diabetic retinopathy [facility])    Hyperlipidemia    Hypertension    Myocardial infarction [facility])    "mild" heart attack   Necrosis (HCC)    #2 nail    Pneumonia    as a child   Poor circulation of extremity    Prostate cancer (HCC) 2017   Prostate   Sleep apnea    uses cpap, getting a new one   Type 2 Diabetes mellitus    Type 2    Past Surgical History:  Procedure Laterality Date   ABOVE KNEE LEG AMPUTATION Left 2004   started out below knee and then extended to above knee due to poor healing   AMPUTATION Right 04/28/2015   Procedure: AMPUTATION RAY, RIGHT 5TH TOE;  Surgeon: Eldred Manges, MD;  Location: [facility] OR;  Service: Orthopedics;  Laterality: Right;   AMPUTATION Right 05/10/2015   Procedure: Right Below Knee Amputation;  Surgeon: Eldred Manges, MD;  Location: [facility] OR;  Service: Orthopedics;  Laterality: Right;   CATARACT EXTRACTION W/PHACO  09/05/2012   Procedure: CATARACT EXTRACTION PHACO AND INTRAOCULAR LENS PLACEMENT (IOC);  Surgeon: Gemma Payor, MD;  Location: [facility] ORS;  Service: Ophthalmology;  Laterality: Left;  CDE=5.45   CATARACT EXTRACTION W/PHACO  10/03/2012   Procedure: CATARACT EXTRACTION PHACO AND INTRAOCULAR LENS PLACEMENT (IOC);  Surgeon: Gemma Payor, MD;  Location: [facility] ORS;  Service: Ophthalmology;  Laterality: Right;  CDE: 12.31   COLONOSCOPY  2011   Dr. Arlyce Dice: colon polyps, tubular adenoma   COLONOSCOPY N/A 11/01/2018   Dr. Jena Gauss: Blood noted in the rectal vault.  Vascular pattern of the rectum abnormal, neovascular changes distally and actively oozing.  There were 3 2 to 5 mm polyps in the splenic flexure, cecum removed.  Cecal polyp was not recovered.  Radiation proctitis status post APC treatment.  The splenic flexure polyps were tubular adenomas.  Next colonoscopy in 5 years.   FLEXIBLE SIGMOIDOSCOPY N/A 10/27/2019   Procedure: FLEXIBLE SIGMOIDOSCOPY;   Surgeon: West Bali, MD; rectal bleeding due to radiation proctitis s/p APC therapy (actively bleeding during exam), rectosigmoid colon and sigmoid colon appeared normal.   FLEXIBLE SIGMOIDOSCOPY N/A 03/13/2022   Procedure: FLEXIBLE SIGMOIDOSCOPY;  Surgeon: Lanelle Bal, DO;  Location: [facility];  Service: Endoscopy;  Laterality: N/A;  1:45pm   HOT HEMOSTASIS  03/13/2022   Procedure: HOT HEMOSTASIS (ARGON PLASMA COAGULATION/BICAP);  Surgeon: Lanelle Bal, DO;  Location: [facility];  Service: Endoscopy;;   POLYPECTOMY  11/01/2018   Procedure: POLYPECTOMY;  Surgeon: Corbin Ade, MD;  Location: [facility];  Service: Endoscopy;;  cecum,splenic flexure   REFRACTIVE SURGERY Left 08/01/2021   Cleaned cataract    Social History   Socioeconomic History   Marital status: Married    Spouse name: Not on file   Number of children: 2   Years of education: college   Highest education level: Not on file  Occupational History   Occupation: Engineer, materials: [employer]  Tobacco Use   Smoking status: Never    Passive exposure: Never   Smokeless tobacco: Never  Vaping Use   Vaping Use: Never used  Substance and Sexual Activity   Alcohol use: No   Drug use: No   Sexual activity: Not Currently  Other Topics Concern  Not on file  Social History Narrative   Patient lives with his wife    Patient right handed   Patient drinks caffine on occ.   Social Determinants of Health   Financial Resource Strain: Not on file  Food Insecurity: No Food Insecurity (01/04/2023)   Hunger Vital Sign    Worried About Running Out of Food in the Last Year: Never true    Ran Out of Food in the Last Year: Never true  Transportation Needs: No Transportation Needs (01/04/2023)   PRAPARE - Administrator, Civil Service (Medical): No    Lack of Transportation (Non-Medical): No  Physical Activity: Not on file  Stress: Not on file  Social Connections: Not on file   Intimate Partner Violence: Not At Risk (01/04/2023)   Humiliation, Afraid, Rape, and Kick questionnaire    Fear of Current or Ex-Partner: No    Emotionally Abused: No    Physically Abused: No    Sexually Abused: No    Family History  Problem Relation Age of Onset   Diabetes Mother    Hypertension Mother    Lung cancer Father 99   Breast cancer Sister 76   Prostate cancer Maternal Grandfather    Sickle cell anemia Daughter    Prostate cancer Cousin    Prostate cancer Cousin    Colon cancer Neg Hx        Objective: Vitals:   04/05/23 0940  BP: 115/68  Pulse: 94      Physical Exam Vitals reviewed.  Constitutional:      Appearance: Normal appearance.  Neurological:     Mental Status: He is alert.     Lab Results:  PSA PSA  Date Value Ref Range Status  03/16/2020 <0.1 < OR = 4.0 ng/mL Final    Comment:    The total PSA value from this assay system is  standardized against the WHO standard. The test  result will be approximately 20% lower when compared  to the equimolar-standardized total PSA (Beckman  Coulter). Comparison of serial PSA results should be  interpreted with this fact in mind. . This test was performed using the Siemens  chemiluminescent method. Values obtained from  different assay methods cannot be used interchangeably. PSA levels, regardless of value, should not be interpreted as absolute evidence of the presence or absence of disease.   01/03/2017 6.8 (H) <=4.0 ng/mL Final    Comment:      The total PSA value from this assay system is standardized against the WHO standard. The test result will be approximately 20% lower when compared to the equimolar-standardized total PSA (Beckman Coulter). Comparison of serial PSA results should be interpreted with this fact in mind.   This test was performed using the Siemens chemiluminescent method. Values obtained from different assay methods cannot be used interchangeably. PSA levels,  regardless of value, should not be interpreted as absolute evidence of the presence or absence of disease.      Testosterone  Date Value Ref Range Status  12/28/2022 10 (L) 264 - 916 ng/dL Final    Comment:    Adult male reference interval is based on a population of healthy nonobese males (BMI <30) between 21 and 23 years old. Travison, et.al. JCEM [phone]. PMID: 91478295.   07/05/2022 5 (L) 264 - 916 ng/dL Final    Comment:    Adult male reference interval is based on a population of healthy nonobese males (BMI <30) between 61 and 4 years old.  Travison, et.al. JCEM [id number]. PMID: 96295284.   03/09/2022 10 (L) 264 - 916 ng/dL Final    Comment:    Adult male reference interval is based on a population of healthy nonobese males (BMI <30) between 100 and 27 years old. Travison, et.al. JCEM [id number]. PMID: 44034742.    Lab Results  Component Value Date   PSA1 1.8 12/28/2022   PSA1 0.9 09/19/2022   PSA1 0.6 07/05/2022  PSA on 02/08/23 was 0.74 UA reviewed and clear.   No results found for this or any previous visit (from the past 24 hour(s)).    Studies/Results: No results found.  No results found. Records from Dr. Ellin Saba reviewed.     Assessment & Plan: Castrate Resistant Prostate cancer.   He has no mets on PSMA PET but he does have locally recurrent disease on Eligard.  His PSA is falling on the Darolutamide.  He was not felt to be a candidate for additional radiation to the prostate.  Urge incontinence.  He he has some increased OAB symptoms and I will switch him to Gemtesa samples since we can't increase the Myrbetriq with his CKD.  We will work on getting a PA for [employer].  Hx of UTI.  UA is clear.  Meds ordered this encounter  Medications   leuprolide (6 Month) (ELIGARD) injection 45 mg   Vibegron (GEMTESA) 75 MG TABS    Sig: Take 1 tablet (75 mg total) by mouth daily.    Dispense:  30 tablet    Refill:  11   Vibegron  (GEMTESA) 75 MG TABS    Sig: Take 1 tablet (75 mg total) by mouth daily.    Dispense:  42 tablet    Refill:  0     Orders Placed This Encounter  Procedures   Urinalysis, Routine w reflex microscopic       Return in about 6 months (around 10/05/2023) for for Eligard..   CC: Benita Stabile, MD  and Dr. Doreatha Massed.     [name] 04/06/2023 Patient ID: Dorena Cookey, male   DOB: [birth year], 72 y.o.   MRN: [id number]

## 2023-04-06 ENCOUNTER — Encounter: Payer: Self-pay | Admitting: Urology

## 2023-04-25 DIAGNOSIS — E1122 Type 2 diabetes mellitus with diabetic chronic kidney disease: Secondary | ICD-10-CM | POA: Diagnosis not present

## 2023-04-25 DIAGNOSIS — E782 Mixed hyperlipidemia: Secondary | ICD-10-CM | POA: Diagnosis not present

## 2023-04-27 ENCOUNTER — Telehealth: Payer: Self-pay

## 2023-04-27 NOTE — Telephone Encounter (Addendum)
Tier exception done today- key [id number]- alternatives below

## 2023-04-27 NOTE — Telephone Encounter (Signed)
-----   Message from Bjorn Pippin, MD sent at 04/05/2023 10:00 AM EDT ----- He is going to need a PA for the [facility] that I sent.  Hopefully we could find a formulary exemption or assistance program.  He is doing better on Gemtesa than Myrbetriq and I don't want to use an anticholinergic.

## 2023-05-01 DIAGNOSIS — E11319 Type 2 diabetes mellitus with unspecified diabetic retinopathy without macular edema: Secondary | ICD-10-CM | POA: Diagnosis not present

## 2023-05-01 DIAGNOSIS — C61 Malignant neoplasm of prostate: Secondary | ICD-10-CM | POA: Diagnosis not present

## 2023-05-01 DIAGNOSIS — E1122 Type 2 diabetes mellitus with diabetic chronic kidney disease: Secondary | ICD-10-CM | POA: Diagnosis not present

## 2023-05-01 DIAGNOSIS — N1831 Chronic kidney disease, stage 3a: Secondary | ICD-10-CM | POA: Diagnosis not present

## 2023-05-01 DIAGNOSIS — K625 Hemorrhage of anus and rectum: Secondary | ICD-10-CM | POA: Diagnosis not present

## 2023-05-01 DIAGNOSIS — H409 Unspecified glaucoma: Secondary | ICD-10-CM | POA: Diagnosis not present

## 2023-05-01 DIAGNOSIS — Z89612 Acquired absence of left leg above knee: Secondary | ICD-10-CM | POA: Diagnosis not present

## 2023-05-01 DIAGNOSIS — E782 Mixed hyperlipidemia: Secondary | ICD-10-CM | POA: Diagnosis not present

## 2023-05-01 DIAGNOSIS — I129 Hypertensive chronic kidney disease with stage 1 through stage 4 chronic kidney disease, or unspecified chronic kidney disease: Secondary | ICD-10-CM | POA: Diagnosis not present

## 2023-05-01 DIAGNOSIS — E113291 Type 2 diabetes mellitus with mild nonproliferative diabetic retinopathy without macular edema, right eye: Secondary | ICD-10-CM | POA: Diagnosis not present

## 2023-05-01 DIAGNOSIS — Z89511 Acquired absence of right leg below knee: Secondary | ICD-10-CM | POA: Diagnosis not present

## 2023-05-01 DIAGNOSIS — I2510 Atherosclerotic heart disease of native coronary artery without angina pectoris: Secondary | ICD-10-CM | POA: Diagnosis not present

## 2023-05-15 ENCOUNTER — Inpatient Hospital Stay: Payer: Medicare Other | Attending: Hematology

## 2023-05-15 DIAGNOSIS — D509 Iron deficiency anemia, unspecified: Secondary | ICD-10-CM

## 2023-05-15 DIAGNOSIS — D649 Anemia, unspecified: Secondary | ICD-10-CM | POA: Insufficient documentation

## 2023-05-15 DIAGNOSIS — C61 Malignant neoplasm of prostate: Secondary | ICD-10-CM | POA: Insufficient documentation

## 2023-05-15 LAB — CBC WITH DIFFERENTIAL/PLATELET
Abs Immature Granulocytes: 0.02 10*3/uL (ref 0.00–0.07)
Basophils Absolute: 0 10*3/uL (ref 0.0–0.1)
Basophils Relative: 0 %
Eosinophils Absolute: 0.1 10*3/uL (ref 0.0–0.5)
Eosinophils Relative: 2 %
HCT: 34.6 % — ABNORMAL LOW (ref 39.0–52.0)
Hemoglobin: 11.5 g/dL — ABNORMAL LOW (ref 13.0–17.0)
Immature Granulocytes: 0 %
Lymphocytes Relative: 23 %
Lymphs Abs: 1.2 10*3/uL (ref 0.7–4.0)
MCH: 31.3 pg (ref 26.0–34.0)
MCHC: 33.2 g/dL (ref 30.0–36.0)
MCV: 94.3 fL (ref 80.0–100.0)
Monocytes Absolute: 0.4 10*3/uL (ref 0.1–1.0)
Monocytes Relative: 8 %
Neutro Abs: 3.5 10*3/uL (ref 1.7–7.7)
Neutrophils Relative %: 67 %
Platelets: 228 10*3/uL (ref 150–400)
RBC: 3.67 MIL/uL — ABNORMAL LOW (ref 4.22–5.81)
RDW: 15 % (ref 11.5–15.5)
WBC: 5.3 10*3/uL (ref 4.0–10.5)
nRBC: 0 % (ref 0.0–0.2)

## 2023-05-15 LAB — COMPREHENSIVE METABOLIC PANEL
ALT: 38 U/L (ref 0–44)
AST: 28 U/L (ref 15–41)
Albumin: 2.9 g/dL — ABNORMAL LOW (ref 3.5–5.0)
Alkaline Phosphatase: 109 U/L (ref 38–126)
Anion gap: 7 (ref 5–15)
BUN: 30 mg/dL — ABNORMAL HIGH (ref 8–23)
CO2: 25 mmol/L (ref 22–32)
Calcium: 8.8 mg/dL — ABNORMAL LOW (ref 8.9–10.3)
Chloride: 110 mmol/L (ref 98–111)
Creatinine, Ser: 1.76 mg/dL — ABNORMAL HIGH (ref 0.61–1.24)
GFR, Estimated: 41 mL/min — ABNORMAL LOW (ref >60)
Glucose, Bld: 168 mg/dL — ABNORMAL HIGH (ref 70–99)
Potassium: 4 mmol/L (ref 3.5–5.1)
Sodium: 142 mmol/L (ref 135–145)
Total Bilirubin: 0.6 mg/dL (ref 0.3–1.2)
Total Protein: 6.4 g/dL — ABNORMAL LOW (ref 6.5–8.1)

## 2023-05-15 LAB — IRON AND TIBC
Iron: 80 ug/dL (ref 45–182)
Saturation Ratios: 37 % (ref 17.9–39.5)
TIBC: 218 ug/dL — ABNORMAL LOW (ref 250–450)
UIBC: 138 ug/dL

## 2023-05-15 LAB — PSA: Prostatic Specific Antigen: 0.01 ng/mL (ref 0.00–4.00)

## 2023-05-15 LAB — FERRITIN: Ferritin: 475 ng/mL — ABNORMAL HIGH (ref 24–336)

## 2023-05-21 NOTE — Progress Notes (Signed)
[facility] [address], [place] [address]    Clinic Day:  05/22/2023  Referring physician: Benita Stabile, MD  Patient Care Team: Benita Stabile, MD as PCP - General (Internal Medicine) Erroll Luna, [facility] (Inactive) as Pharmacist (Pharmacist) Lanelle Bal, DO as Consulting Physician (Internal Medicine) Lanelle Bal, DO as Consulting Physician (Gastroenterology) Doreatha Massed, MD as Medical Oncologist (Medical Oncology) Therese Sarah, RN as Oncology Nurse Navigator (Medical Oncology)   ASSESSMENT & PLAN:   Assessment: 1.  Castration refractory prostate cancer, T3 N1 M0: - 04/27/2017: Prostatic adenocarcinoma Gleason 4+5=9 involving both lobes, PSA 9.8 - Status post long-term ADT plus definitive EBRT from 08/27/2017 through 10/26/2017 - History of radiation-induced cystitis and proctitis - Patient on Eligard every 6 months since 03/19/2020 - PSA <0.1 (09/01/2021), 0.2 (03/09/2022), 0.6 (07/05/2022), 0.9 (09/19/2022), 1.8 (2/29/2022) - PSMA PET (12/14/2022): Marked uptake in the prostate gland compatible with residual/recurrent prostate cancer.  No evidence of nodal or distant metastatic disease.  No focal bone mets. - Darolutamide 600 mg twice daily started on 01/16/2023. - Invitae germline: MSH 3 heterozygous VUS.  No pathogenic variants detected. - CARIS ASSURE (03/21/2023): No pathogenic tumor derived somatic variants detected.  TMB indeterminate.  MSI indeterminate.  Most likely low tumor circulating cells.   2.  Social/family history: - Lives at home with his wife.  Independent of ADLs and IADLs.  He had right BKA and left AKA from diabetes and has prosthesis.  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  He is a non-smoker. - Father had lung cancer.  Maternal grand father had prostate cancer.  Sister had breast cancer.    Plan: 1.  M0 Castrate resistant prostate cancer: - He is tolerating darolutamide 600 mg twice  daily very well. - Received Eligard at Dr. Belva Crome office on 04/05/2023. - Reviewed labs today: Creatinine stable at 1.76.  Albumin 2.9.  Rest of LFTs normal.  CBC grossly normal.  PSA 0.01, improved from 0.74 previously. - Continue developed a mild 600 mg twice daily until progression or intolerance. - Discussed caris blood NGS results which did not show any abnormality.  However this is due to low circulating tumor cells and could be false negative.  Will consider repeating if there is any worsening metastatic disease. - RTC 3 months for follow-up with repeat labs.   2.  Bone health: - DEXA scan at [facility] on 06/22/2022: T-score -0.4, normal. - Continue calcium and vitamin D supplements. - Will consider repeating DEXA scan in the future.   3.  Normocytic anemia: - Combination anemia from functional iron deficiency and CKD. - Monoferric 1 g on 04/02/2023. - Ferritin 475, percent saturation 37.  Hemoglobin 11.5. - Does not require any parenteral iron therapy at this time.    Orders Placed This Encounter  Procedures   CBC with Differential    Standing Status:   Future    Standing Expiration Date:   05/21/2024   Comprehensive metabolic panel    Standing Status:   Future    Standing Expiration Date:   05/21/2024   PSA    Standing Status:   Future    Standing Expiration Date:   05/21/2024   Iron and TIBC ([facility] ONLY)    Standing Status:   Future    Standing Expiration Date:   05/21/2024   Ferritin    Standing Status:   Future    Standing Expiration Date:  05/21/2024     I,Helena R Teague,acting as a scribe for Doreatha Massed, MD.,have documented all relevant documentation on the behalf of Doreatha Massed, MD,as directed by  Doreatha Massed, MD while in the presence of Doreatha Massed, MD.  I, Doreatha Massed MD, have reviewed the above documentation for accuracy and completeness, and I agree with the above.   Doreatha Massed, MD    7/23/20244:58 PM  CHIEF COMPLAINT:   Diagnosis: prostate cancer    Cancer Staging  Prostate cancer [facility]) Staging form: Prostate, AJCC 8th Edition - Clinical: Stage IVA (cT1c, cN1, cM0, PSA: 9.8, Grade Group: 5) - Signed by Margaretmary Dys, MD on 06/09/2017    Prior Therapy: radiation therapy 10/29-12/28/2018   Current Therapy:  Leurpolide injections and Darolutamide    HISTORY OF PRESENT ILLNESS:   Oncology History   No history exists.     INTERVAL HISTORY:   [name] is a 72 y.o. male presenting to clinic today for follow up of prostate cancer. He was last seen by me on 03/22/23.  Today, he states that he is doing well overall. His appetite level is at 100%. His energy level is at 80%.  PAST MEDICAL HISTORY:   Past Medical History: Past Medical History:  Diagnosis Date   Arthritis    Cerebrovascular disease    MRI shows Right carotid inferior cavernours narrowing 75% and  50-75% stenosis of Cavernous and supraclinoi right side   CKD (chronic kidney disease) 06/28/2014   Sees Dr Lowell Guitar   Diabetic retinopathy [facility])    Hyperlipidemia    Hypertension    Myocardial infarction [facility])    "mild" heart attack   Necrosis (HCC)    #2 nail    Pneumonia    as a child   Poor circulation of extremity    Prostate cancer (HCC) 2017   Prostate   Sleep apnea    uses cpap, getting a new one   Type 2 Diabetes mellitus    Type 2    Surgical History: Past Surgical History:  Procedure Laterality Date   ABOVE KNEE LEG AMPUTATION Left 2004   started out below knee and then extended to above knee due to poor healing   AMPUTATION Right 04/28/2015   Procedure: AMPUTATION RAY, RIGHT 5TH TOE;  Surgeon: Eldred Manges, MD;  Location: [facility] OR;  Service: Orthopedics;  Laterality: Right;   AMPUTATION Right 05/10/2015   Procedure: Right Below Knee Amputation;  Surgeon: Eldred Manges, MD;  Location: [facility] OR;  Service: Orthopedics;  Laterality: Right;   CATARACT EXTRACTION W/PHACO  09/05/2012    Procedure: CATARACT EXTRACTION PHACO AND INTRAOCULAR LENS PLACEMENT (IOC);  Surgeon: Gemma Payor, MD;  Location: [facility] ORS;  Service: Ophthalmology;  Laterality: Left;  CDE=5.45   CATARACT EXTRACTION W/PHACO  10/03/2012   Procedure: CATARACT EXTRACTION PHACO AND INTRAOCULAR LENS PLACEMENT (IOC);  Surgeon: Gemma Payor, MD;  Location: [facility] ORS;  Service: Ophthalmology;  Laterality: Right;  CDE: 12.31   COLONOSCOPY  2011   Dr. Arlyce Dice: colon polyps, tubular adenoma   COLONOSCOPY N/A 11/01/2018   Dr. Jena Gauss: Blood noted in the rectal vault.  Vascular pattern of the rectum abnormal, neovascular changes distally and actively oozing.  There were 3 2 to 5 mm polyps in the splenic flexure, cecum removed.  Cecal polyp was not recovered.  Radiation proctitis status post APC treatment.  The splenic flexure polyps were tubular adenomas.  Next colonoscopy in 5 years.   FLEXIBLE SIGMOIDOSCOPY N/A 10/27/2019   Procedure: FLEXIBLE SIGMOIDOSCOPY;  Surgeon: West Bali, MD; rectal bleeding due to radiation proctitis s/p APC therapy (actively bleeding during exam), rectosigmoid colon and sigmoid colon appeared normal.   FLEXIBLE SIGMOIDOSCOPY N/A 03/13/2022   Procedure: FLEXIBLE SIGMOIDOSCOPY;  Surgeon: Lanelle Bal, DO;  Location: [facility] ENDO SUITE;  Service: Endoscopy;  Laterality: N/A;  1:45pm   HOT HEMOSTASIS  03/13/2022   Procedure: HOT HEMOSTASIS (ARGON PLASMA COAGULATION/BICAP);  Surgeon: Lanelle Bal, DO;  Location: [facility] END[facility] SUITE;  Service: Endoscopy;;   POLYPECTOMY  11/01/2018   Procedure: POLYPECTOMY;  Surgeon: Corbin Ade, MD;  Location: [facility] END[facility] SUITE;  Service: Endoscopy;;  cecum,splenic flexure   REFRACTIVE SURGERY Left 08/01/2021   Cleaned cataract    Social History: Social History   Socioeconomic History   Marital status: Married    Spouse name: Not on file   Number of children: 2   Years of education: college   Highest education level: Not on file  Occupational History   Occupation:  Engineer, materials: [employer]  Tobacco Use   Smoking status: Never    Passive exposure: Never   Smokeless tobacco: Never  Vaping Use   Vaping status: Never Used  Substance and Sexual Activity   Alcohol use: No   Drug use: No   Sexual activity: Not Currently  Other Topics Concern   Not on file  Social History Narrative   Patient lives with his wife    Patient right handed   Patient drinks caffine on occ.   Social Determinants of Health   Financial Resource Strain: Not on file  Food Insecurity: No Food Insecurity (01/04/2023)   Hunger Vital Sign    Worried About Running Out of Food in the Last Year: Never true    Ran Out of Food in the Last Year: Never true  Transportation Needs: No Transportation Needs (01/04/2023)   PRAPARE - Administrator, Civil Service (Medical): No    Lack of Transportation (Non-Medical): No  Physical Activity: Not on file  Stress: Not on file  Social Connections: Not on file  Intimate Partner Violence: Not At Risk (01/04/2023)   Humiliation, Afraid, Rape, and Kick questionnaire    Fear of Current or Ex-Partner: No    Emotionally Abused: No    Physically Abused: No    Sexually Abused: No    Family History: Family History  Problem Relation Age of Onset   Diabetes Mother    Hypertension Mother    Lung cancer Father 9   Breast cancer Sister 58   Prostate cancer Maternal Grandfather    Sickle cell anemia Daughter    Prostate cancer Cousin    Prostate cancer Cousin    Colon cancer Neg Hx     Current Medications:  Current Outpatient Medications:    acetaminophen (TYLENOL) 325 MG tablet, Take 650 mg by mouth daily as needed for mild pain, moderate pain or headache., Disp: , Rfl:    atenolol (TENORMIN) 25 MG tablet, TAKE 1 TABLET BY MOUTH EVERY DAY, Disp: 90 tablet, Rfl: 1   atorvastatin (LIPITOR) 80 MG tablet, TAKE 1 TABLET BY MOUTH EVERY DAY, Disp: 90 tablet, Rfl: 1   B-D UF III MINI PEN NEEDLES 31G X 5 MM MISC, USE  FOUR TIMES DAILY AS DIRECTED, Disp: 150 each, Rfl: 2   Calcium Carb-Cholecalciferol 600-500 MG-UNIT CAPS, Take 2 capsules by mouth daily. , Disp: , Rfl:    clopidogrel (PLAVIX) 75 MG tablet, TAKE 1 TABLET BY MOUTH  EVERY DAY, Disp: 90 tablet, Rfl: 1   CONTOUR TEST test strip, TEST TWICE DAILY E11.65, Disp: 200 each, Rfl: 2   darolutamide (NUBEQA) 300 MG tablet, Take 2 tablets (600 mg total) by mouth 2 (two) times daily with a meal., Disp: 120 tablet, Rfl: 1   furosemide (LASIX) 20 MG tablet, Take 20 mg by mouth 3 (three) times a week., Disp: , Rfl:    insulin degludec (TRESIBA FLEXTOUCH) 100 UNIT/ML SOPN FlexTouch Pen, Inject 30 Units into the skin at bedtime., Disp: , Rfl:    latanoprost (XALATAN) 0.005 % ophthalmic solution, Place 1 drop into both eyes at bedtime., Disp: , Rfl:    prochlorperazine (COMPAZINE) 10 MG tablet, Take 1 tablet (10 mg total) by mouth every 6 (six) hours as needed for nausea or vomiting., Disp: 30 tablet, Rfl: 1   Semaglutide, 1 MG/DOSE, 4 MG/3ML SOPN, Inject 1 mg as directed once a week., Disp: 6 mL, Rfl: 3   Vibegron (GEMTESA) 75 MG TABS, Take 1 tablet (75 mg total) by mouth daily., Disp: 30 tablet, Rfl: 11   Vibegron (GEMTESA) 75 MG TABS, Take 1 tablet (75 mg total) by mouth daily., Disp: 42 tablet, Rfl: 0   Allergies: Allergies  Allergen Reactions   Zolpidem Tartrate Other (See Comments)    disorientation     REVIEW OF SYSTEMS:   Review of Systems  Constitutional:  Negative for chills, fatigue and fever.  HENT:   Negative for lump/mass, mouth sores, nosebleeds, sore throat and trouble swallowing.   Eyes:  Negative for eye problems.  Respiratory:  Negative for cough and shortness of breath.   Cardiovascular:  Negative for chest pain, leg swelling and palpitations.  Gastrointestinal:  Negative for abdominal pain, constipation, diarrhea, nausea and vomiting.  Genitourinary:  Negative for bladder incontinence, difficulty urinating, dysuria, frequency, hematuria  and nocturia.   Musculoskeletal:  Negative for arthralgias, back pain, flank pain, myalgias and neck pain.  Skin:  Negative for itching and rash.  Neurological:  Positive for numbness. Negative for dizziness and headaches.  Hematological:  Does not bruise/bleed easily.  Psychiatric/Behavioral:  Negative for depression, sleep disturbance and suicidal ideas. The patient is not nervous/anxious.   All other systems reviewed and are negative.    VITALS:   Blood pressure 116/67, pulse 65, temperature 98.8 F (37.1 C), temperature source Oral, resp. rate 18, weight 248 lb 11.2 oz (112.8 kg), SpO2 100%.  Wt Readings from Last 3 Encounters:  05/22/23 248 lb 11.2 oz (112.8 kg)  02/22/23 251 lb (113.9 kg)  02/14/23 252 lb (114.3 kg)    Body mass index is 34.69 kg/m.  Performance status (ECOG): 1 - Symptomatic but completely ambulatory  PHYSICAL EXAM:   Physical Exam Vitals and nursing note reviewed. Exam conducted with a chaperone present.  Constitutional:      Appearance: Normal appearance.  Cardiovascular:     Rate and Rhythm: Normal rate and regular rhythm.     Pulses: Normal pulses.     Heart sounds: Normal heart sounds.  Pulmonary:     Effort: Pulmonary effort is normal.     Breath sounds: Normal breath sounds.  Abdominal:     Palpations: Abdomen is soft. There is no hepatomegaly, splenomegaly or mass.     Tenderness: There is no abdominal tenderness.  Musculoskeletal:     Right lower leg: No edema.     Left lower leg: No edema.  Lymphadenopathy:     Cervical: No cervical adenopathy.     Right  cervical: No superficial, deep or posterior cervical adenopathy.    Left cervical: No superficial, deep or posterior cervical adenopathy.     Upper Body:     Right upper body: No supraclavicular or axillary adenopathy.     Left upper body: No supraclavicular or axillary adenopathy.  Neurological:     General: No focal deficit present.     Mental Status: He is alert and oriented to  person, place, and time.  Psychiatric:        Mood and Affect: Mood normal.        Behavior: Behavior normal.     LABS:      Latest Ref Rng & Units 05/15/2023   10:07 AM 03/21/2023    9:09 AM 02/22/2023   10:07 AM  CBC  WBC 4.0 - 10.5 K/uL 5.3  5.3  4.1   Hemoglobin 13.0 - 17.0 g/dL 40.9  81.1  91.4   Hematocrit 39.0 - 52.0 % 34.6  34.1  34.3   Platelets 150 - 400 K/uL 228  205  252       Latest Ref Rng & Units 05/15/2023   10:07 AM 03/21/2023    9:09 AM 02/22/2023   10:07 AM  CMP  Glucose 70 - 99 mg/dL 782  956  213   BUN 8 - 23 mg/dL 30  31  26    Creatinine 0.61 - 1.24 mg/dL 0.86  5.78  4.69   Sodium 135 - 145 mmol/L 142  138  137   Potassium 3.5 - 5.1 mmol/L 4.0  4.0  3.9   Chloride 98 - 111 mmol/L 110  105  104   CO2 22 - 32 mmol/L 25  23  24    Calcium 8.9 - 10.3 mg/dL 8.8  8.6  8.9   Total Protein 6.5 - 8.1 g/dL 6.4  6.6  6.7   Total Bilirubin 0.3 - 1.2 mg/dL 0.6  0.9  0.7   Alkaline Phos 38 - 126 U/L 109  97  90   AST 15 - 41 U/L 28  28  29    ALT 0 - 44 U/L 38  24  24      No results found for: "CEA1", "CEA" / No results found for: "CEA1", "CEA" Lab Results  Component Value Date   PSA1 1.8 12/28/2022   No results found for: "GEX528" No results found for: "CAN125"  No results found for: "TOTALPROTELP", "ALBUMINELP", "A1GS", "A2GS", "BETS", "BETA2SER", "GAMS", "MSPIKE", "SPEI" Lab Results  Component Value Date   TIBC 218 (L) 05/15/2023   TIBC 256 02/22/2023   TIBC 235 (L) 03/16/2020   FERRITIN 475 (H) 05/15/2023   FERRITIN 63 02/22/2023   FERRITIN 126 03/16/2020   IRONPCTSAT 37 05/15/2023   IRONPCTSAT 27 02/22/2023   IRONPCTSAT 24 03/16/2020   No results found for: "LDH"   STUDIES:   No results found.

## 2023-05-22 ENCOUNTER — Inpatient Hospital Stay (HOSPITAL_BASED_OUTPATIENT_CLINIC_OR_DEPARTMENT_OTHER): Payer: Medicare Other | Admitting: Hematology

## 2023-05-22 VITALS — BP 116/67 | HR 65 | Temp 98.8°F | Resp 18 | Wt 248.7 lb

## 2023-05-22 DIAGNOSIS — D649 Anemia, unspecified: Secondary | ICD-10-CM | POA: Diagnosis not present

## 2023-05-22 DIAGNOSIS — C61 Malignant neoplasm of prostate: Secondary | ICD-10-CM | POA: Diagnosis not present

## 2023-05-22 DIAGNOSIS — D509 Iron deficiency anemia, unspecified: Secondary | ICD-10-CM

## 2023-05-22 NOTE — Patient Instructions (Addendum)
[facility] at [facility] Discharge Instructions   You were seen and examined today by Dr. Ellin Saba.  He reviewed the results of your lab work. Your PSA is excellent at 0.01. All other results are normal/stable.   Continue Nubeqa as prescribed.   We will see you back in 3 months. We will repeat lab work prior to your next visit.  Return as scheduled.    Thank you for choosing [facility] at [facility] to provide your oncology and hematology care.  To afford each patient quality time with our provider, please arrive at least 15 minutes before your scheduled appointment time.   If you have a lab appointment with the Cancer Center please come in thru the Main Entrance and check in at the main information desk.  You need to re-schedule your appointment should you arrive 10 or more minutes late.  We strive to give you quality time with our providers, and arriving late affects you and other patients whose appointments are after yours.  Also, if you no show three or more times for appointments you may be dismissed from the clinic at the providers discretion.     Again, thank you for choosing [facility].  Our hope is that these requests will decrease the amount of time that you wait before being seen by our physicians.       _____________________________________________________________  Should you have questions after your visit to [facility], please contact our office at [phone] and follow the prompts.  Our office hours are 8:00 a.m. and 4:30 p.m. Monday - Friday.  Please note that voicemails left after 4:00 p.m. may not be returned until the following business day.  We are closed weekends and major holidays.  You do have access to a nurse 24-7, just call the main number to the clinic [phone] and do not press any options, hold on the line and a nurse will answer the phone.    For prescription refill requests,  have your pharmacy contact our office and allow 72 hours.    Due to Covid, you will need to wear a mask upon entering the hospital. If you do not have a mask, a mask will be given to you at the Main Entrance upon arrival. For doctor visits, patients may have 1 support person age 72 or older with them. For treatment visits, patients can not have anyone with them due to social distancing guidelines and our immunocompromised population.

## 2023-06-04 ENCOUNTER — Encounter: Payer: Self-pay | Admitting: Radiation Oncology

## 2023-06-06 ENCOUNTER — Encounter: Payer: Self-pay | Admitting: Internal Medicine

## 2023-06-06 ENCOUNTER — Ambulatory Visit (INDEPENDENT_AMBULATORY_CARE_PROVIDER_SITE_OTHER): Payer: Medicare Other | Admitting: Internal Medicine

## 2023-06-06 VITALS — BP 102/58 | HR 106 | Temp 97.8°F | Ht 71.0 in | Wt 248.6 lb

## 2023-06-06 DIAGNOSIS — Q2730 Arteriovenous malformation, site unspecified: Secondary | ICD-10-CM

## 2023-06-06 DIAGNOSIS — K627 Radiation proctitis: Secondary | ICD-10-CM | POA: Diagnosis not present

## 2023-06-06 DIAGNOSIS — K5900 Constipation, unspecified: Secondary | ICD-10-CM

## 2023-06-06 DIAGNOSIS — Z86010 Personal history of colonic polyps: Secondary | ICD-10-CM | POA: Diagnosis not present

## 2023-06-06 NOTE — Patient Instructions (Signed)
I am happy to hear that you are doing so well.  Follow-up in 6 months.  We will plan on colonoscopy at that time.  It is always a pleasure seeing you.  Dr. Marletta Lor

## 2023-06-06 NOTE — Progress Notes (Signed)
Referring Provider: Benita Stabile, MD Primary Care Physician:  Benita Stabile, MD Primary GI:  Dr. Marletta Lor  Chief Complaint  Patient presents with   Follow-up    Patient here today for a follow up visit on Constipation. Patient says he is no longer having issues with the constipation. He says he was at one point taking a stool softener,but has since stopped. Patient denies any other current gi issues.    HPI:   [name] is a 72 y.o. male who presents to clinic today for follow-up visit.  Patient has a history of radiation proctitis, rectal bleeding, and adenomatous colon polyps. He had flex sig with APC treatment of radiation proctitis on 10/27/19 (actively bleeding during exam). He had APC at time of colonoscopy in 10/2018 as well. Next TCS due in 2025. Also with history of anemia likely secondary to chronic disease.  Underwent flexible sigmoidoscopy 03/13/2022 due to worsening rectal bleeding.  Numerous rectal AVMs actively oozing, 1 sigmoid AVM, all treated with APC.  Recent blood work 05/15/2023 with hemoglobin 11.5 consistent with his baseline.  Today, states he is doing well.  No complaints.  Past Medical History:  Diagnosis Date   Arthritis    Cerebrovascular disease    MRI shows Right carotid inferior cavernours narrowing 75% and  50-75% stenosis of Cavernous and supraclinoi right side   CKD (chronic kidney disease) 06/28/2014   Sees Dr Lowell Guitar   Diabetic retinopathy [facility])    Hyperlipidemia    Hypertension    Myocardial infarction [facility])    "mild" heart attack   Necrosis (HCC)    #2 nail    Pneumonia    as a child   Poor circulation of extremity    Prostate cancer (HCC) 2017   Prostate   Sleep apnea    uses cpap, getting a new one   Type 2 Diabetes mellitus    Type 2    Past Surgical History:  Procedure Laterality Date   ABOVE KNEE LEG AMPUTATION Left 2004   started out below knee and then extended to above knee due to poor healing   AMPUTATION Right 04/28/2015    Procedure: AMPUTATION RAY, RIGHT 5TH TOE;  Surgeon: Eldred Manges, MD;  Location: [facility] OR;  Service: Orthopedics;  Laterality: Right;   AMPUTATION Right 05/10/2015   Procedure: Right Below Knee Amputation;  Surgeon: Eldred Manges, MD;  Location: [facility] OR;  Service: Orthopedics;  Laterality: Right;   CATARACT EXTRACTION W/PHACO  09/05/2012   Procedure: CATARACT EXTRACTION PHACO AND INTRAOCULAR LENS PLACEMENT (IOC);  Surgeon: Gemma Payor, MD;  Location: [facility] ORS;  Service: Ophthalmology;  Laterality: Left;  CDE=5.45   CATARACT EXTRACTION W/PHACO  10/03/2012   Procedure: CATARACT EXTRACTION PHACO AND INTRAOCULAR LENS PLACEMENT (IOC);  Surgeon: Gemma Payor, MD;  Location: [facility] ORS;  Service: Ophthalmology;  Laterality: Right;  CDE: 12.31   COLONOSCOPY  2011   Dr. Arlyce Dice: colon polyps, tubular adenoma   COLONOSCOPY N/A 11/01/2018   Dr. Jena Gauss: Blood noted in the rectal vault.  Vascular pattern of the rectum abnormal, neovascular changes distally and actively oozing.  There were 3 2 to 5 mm polyps in the splenic flexure, cecum removed.  Cecal polyp was not recovered.  Radiation proctitis status post APC treatment.  The splenic flexure polyps were tubular adenomas.  Next colonoscopy in 5 years.   FLEXIBLE SIGMOIDOSCOPY N/A 10/27/2019   Procedure: FLEXIBLE SIGMOIDOSCOPY;  Surgeon: West Bali, MD; rectal bleeding due to radiation proctitis  s/p APC therapy (actively bleeding during exam), rectosigmoid colon and sigmoid colon appeared normal.   FLEXIBLE SIGMOIDOSCOPY N/A 03/13/2022   Procedure: FLEXIBLE SIGMOIDOSCOPY;  Surgeon: Lanelle Bal, DO;  Location: [facility];  Service: Endoscopy;  Laterality: N/A;  1:45pm   HOT HEMOSTASIS  03/13/2022   Procedure: HOT HEMOSTASIS (ARGON PLASMA COAGULATION/BICAP);  Surgeon: Lanelle Bal, DO;  Location: [facility];  Service: Endoscopy;;   POLYPECTOMY  11/01/2018   Procedure: POLYPECTOMY;  Surgeon: Corbin Ade, MD;  Location: [facility];  Service:  Endoscopy;;  cecum,splenic flexure   REFRACTIVE SURGERY Left 08/01/2021   Cleaned cataract    Current Outpatient Medications  Medication Sig Dispense Refill   acetaminophen (TYLENOL) 325 MG tablet Take 650 mg by mouth daily as needed for mild pain, moderate pain or headache.     atenolol (TENORMIN) 25 MG tablet TAKE 1 TABLET BY MOUTH EVERY DAY 90 tablet 1   atorvastatin (LIPITOR) 80 MG tablet TAKE 1 TABLET BY MOUTH EVERY DAY 90 tablet 1   B-D UF III MINI PEN NEEDLES 31G X 5 MM MISC USE FOUR TIMES DAILY AS DIRECTED 150 each 2   Calcium Carb-Cholecalciferol 600-500 MG-UNIT CAPS Take 2 capsules by mouth daily.      clopidogrel (PLAVIX) 75 MG tablet TAKE 1 TABLET BY MOUTH EVERY DAY 90 tablet 1   CONTOUR TEST test strip TEST TWICE DAILY E11.65 200 each 2   darolutamide (NUBEQA) 300 MG tablet Take 2 tablets (600 mg total) by mouth 2 (two) times daily with a meal. 120 tablet 1   furosemide (LASIX) 20 MG tablet Take 20 mg by mouth 3 (three) times a week.     insulin degludec (TRESIBA FLEXTOUCH) 100 UNIT/ML SOPN FlexTouch Pen Inject 30 Units into the skin at bedtime.     latanoprost (XALATAN) 0.005 % ophthalmic solution Place 1 drop into both eyes at bedtime.     prochlorperazine (COMPAZINE) 10 MG tablet Take 1 tablet (10 mg total) by mouth every 6 (six) hours as needed for nausea or vomiting. 30 tablet 1   Semaglutide, 1 MG/DOSE, 4 MG/3ML SOPN Inject 1 mg as directed once a week. 6 mL 3   Vibegron (GEMTESA) 75 MG TABS Take 1 tablet (75 mg total) by mouth daily. 30 tablet 11   No current facility-administered medications for this visit.    Allergies as of 06/06/2023 - Review Complete 06/06/2023  Allergen Reaction Noted   Zolpidem tartrate Other (See Comments) 04/11/2011    Family History  Problem Relation Age of Onset   Diabetes Mother    Hypertension Mother    Lung cancer Father 18   Breast cancer Sister 55   Prostate cancer Maternal Grandfather    Sickle cell anemia Daughter     Prostate cancer Cousin    Prostate cancer Cousin    Colon cancer Neg Hx     Social History   Socioeconomic History   Marital status: Married    Spouse name: Not on file   Number of children: 2   Years of education: college   Highest education level: Not on file  Occupational History   Occupation: Engineer, materials: [employer]  Tobacco Use   Smoking status: Never    Passive exposure: Never   Smokeless tobacco: Never  Vaping Use   Vaping status: Never Used  Substance and Sexual Activity   Alcohol use: No   Drug use: No   Sexual activity: Not Currently  Other Topics Concern   Not on file  Social History Narrative   Patient lives with his wife    Patient right handed   Patient drinks caffine on occ.   Social Determinants of Health   Financial Resource Strain: Not on file  Food Insecurity: No Food Insecurity (01/04/2023)   Hunger Vital Sign    Worried About Running Out of Food in the Last Year: Never true    Ran Out of Food in the Last Year: Never true  Transportation Needs: No Transportation Needs (01/04/2023)   PRAPARE - Administrator, Civil Service (Medical): No    Lack of Transportation (Non-Medical): No  Physical Activity: Not on file  Stress: Not on file  Social Connections: Not on file    Subjective: Review of Systems  Constitutional:  Negative for chills and fever.  HENT:  Negative for congestion and hearing loss.   Eyes:  Negative for blurred vision and double vision.  Respiratory:  Negative for cough and shortness of breath.   Cardiovascular:  Negative for chest pain and palpitations.  Gastrointestinal:  Positive for blood in stool. Negative for abdominal pain, constipation, diarrhea, heartburn, melena and vomiting.  Genitourinary:  Negative for dysuria and urgency.  Musculoskeletal:  Negative for joint pain and myalgias.  Skin:  Negative for itching and rash.  Neurological:  Negative for dizziness and headaches.   Psychiatric/Behavioral:  Negative for depression. The patient is not nervous/anxious.      Objective: BP (!) 102/58 (BP Location: Left Arm, Patient Position: Sitting, Cuff Size: Large)   Pulse (!) 106   Temp 97.8 F (36.6 C) (Temporal)   Ht 5\' 11"  (1.803 m)   Wt 248 lb 9.6 oz (112.8 kg)   BMI 34.67 kg/m  Physical Exam Constitutional:      Appearance: Normal appearance.  HENT:     Head: Normocephalic and atraumatic.  Eyes:     Extraocular Movements: Extraocular movements intact.     Conjunctiva/sclera: Conjunctivae normal.  Cardiovascular:     Rate and Rhythm: Normal rate and regular rhythm.  Pulmonary:     Effort: Pulmonary effort is normal.     Breath sounds: Normal breath sounds.  Abdominal:     General: Bowel sounds are normal.     Palpations: Abdomen is soft.  Musculoskeletal:        General: Normal range of motion.     Cervical back: Normal range of motion and neck supple.  Skin:    General: Skin is warm.  Neurological:     General: No focal deficit present.     Mental Status: He is alert and oriented to person, place, and time.  Psychiatric:        Mood and Affect: Mood normal.        Behavior: Behavior normal.      Assessment: *Rectal bleeding-much improved *Rectal AVMs *Adenomatous colon polyps *Constipation  Plan: Patient's rectal bleeding vastly improved.  Denies any further bleeding.  Hemoglobin stable.  Continue to monitor.  Constipation improved, continue stool softeners as needed.  Colonoscopy recall 2025 due to adenomatous colon polyps in the past.  Follow-up with GI in 6 months.  06/06/2023 10:08 AM   Disclaimer: This note was dictated with voice recognition software. Similar sounding words can inadvertently be transcribed and may not be corrected upon review.

## 2023-06-18 ENCOUNTER — Encounter: Payer: Self-pay | Admitting: Nurse Practitioner

## 2023-06-18 ENCOUNTER — Ambulatory Visit (INDEPENDENT_AMBULATORY_CARE_PROVIDER_SITE_OTHER): Payer: Medicare Other | Admitting: Nurse Practitioner

## 2023-06-18 VITALS — BP 124/64 | HR 67 | Ht 71.0 in | Wt 250.8 lb

## 2023-06-18 DIAGNOSIS — I10 Essential (primary) hypertension: Secondary | ICD-10-CM

## 2023-06-18 DIAGNOSIS — Z7985 Long-term (current) use of injectable non-insulin antidiabetic drugs: Secondary | ICD-10-CM | POA: Diagnosis not present

## 2023-06-18 DIAGNOSIS — E559 Vitamin D deficiency, unspecified: Secondary | ICD-10-CM | POA: Diagnosis not present

## 2023-06-18 DIAGNOSIS — Z794 Long term (current) use of insulin: Secondary | ICD-10-CM | POA: Diagnosis not present

## 2023-06-18 DIAGNOSIS — E1159 Type 2 diabetes mellitus with other circulatory complications: Secondary | ICD-10-CM

## 2023-06-18 DIAGNOSIS — E782 Mixed hyperlipidemia: Secondary | ICD-10-CM

## 2023-06-18 NOTE — Progress Notes (Signed)
06/18/2023   Endocrinology follow-up note    Subjective:    Patient ID: [name], male    DOB: 1951/10/15, PCP Benita Stabile, MD   Past Medical History:  Diagnosis Date   Arthritis    Cerebrovascular disease    MRI shows Right carotid inferior cavernours narrowing 75% and  50-75% stenosis of Cavernous and supraclinoi right side   CKD (chronic kidney disease) 06/28/2014   Sees Dr Lowell Guitar   Diabetic retinopathy [facility])    Hyperlipidemia    Hypertension    Myocardial infarction [facility])    "mild" heart attack   Necrosis (HCC)    #2 nail    Pneumonia    as a child   Poor circulation of extremity    Prostate cancer (HCC) 2017   Prostate   Sleep apnea    uses cpap, getting a new one   Type 2 Diabetes mellitus    Type 2   Past Surgical History:  Procedure Laterality Date   ABOVE KNEE LEG AMPUTATION Left 2004   started out below knee and then extended to above knee due to poor healing   AMPUTATION Right 04/28/2015   Procedure: AMPUTATION RAY, RIGHT 5TH TOE;  Surgeon: Eldred Manges, MD;  Location: [facility] OR;  Service: Orthopedics;  Laterality: Right;   AMPUTATION Right 05/10/2015   Procedure: Right Below Knee Amputation;  Surgeon: Eldred Manges, MD;  Location: [facility] OR;  Service: Orthopedics;  Laterality: Right;   CATARACT EXTRACTION W/PHACO  09/05/2012   Procedure: CATARACT EXTRACTION PHACO AND INTRAOCULAR LENS PLACEMENT (IOC);  Surgeon: Gemma Payor, MD;  Location: [facility] ORS;  Service: Ophthalmology;  Laterality: Left;  CDE=5.45   CATARACT EXTRACTION W/PHACO  10/03/2012   Procedure: CATARACT EXTRACTION PHACO AND INTRAOCULAR LENS PLACEMENT (IOC);  Surgeon: Gemma Payor, MD;  Location: [facility] ORS;  Service: Ophthalmology;  Laterality: Right;  CDE: 12.31   COLONOSCOPY  2011   Dr. Arlyce Dice: colon polyps, tubular adenoma   COLONOSCOPY N/A 11/01/2018   Dr. Jena Gauss: Blood noted in the rectal vault.  Vascular pattern of the rectum abnormal, neovascular changes distally and actively oozing.  There were 3 2 to 5  mm polyps in the splenic flexure, cecum removed.  Cecal polyp was not recovered.  Radiation proctitis status post APC treatment.  The splenic flexure polyps were tubular adenomas.  Next colonoscopy in 5 years.   FLEXIBLE SIGMOIDOSCOPY N/A 10/27/2019   Procedure: FLEXIBLE SIGMOIDOSCOPY;  Surgeon: West Bali, MD; rectal bleeding due to radiation proctitis s/p APC therapy (actively bleeding during exam), rectosigmoid colon and sigmoid colon appeared normal.   FLEXIBLE SIGMOIDOSCOPY N/A 03/13/2022   Procedure: FLEXIBLE SIGMOIDOSCOPY;  Surgeon: Lanelle Bal, DO;  Location: [facility];  Service: Endoscopy;  Laterality: N/A;  1:45pm   HOT HEMOSTASIS  03/13/2022   Procedure: HOT HEMOSTASIS (ARGON PLASMA COAGULATION/BICAP);  Surgeon: Lanelle Bal, DO;  Location: [facility];  Service: Endoscopy;;   POLYPECTOMY  11/01/2018   Procedure: POLYPECTOMY;  Surgeon: Corbin Ade, MD;  Location: [facility];  Service: Endoscopy;;  cecum,splenic flexure   REFRACTIVE SURGERY Left 08/01/2021   Cleaned cataract   Social History   Socioeconomic History   Marital status: Married    Spouse name: Not on file   Number of children: 2   Years of education: college   Highest education level: Not on file  Occupational History   Occupation: Education officer, environmental    Employer: [employer]  Tobacco Use   Smoking  status: Never    Passive exposure: Never   Smokeless tobacco: Never  Vaping Use   Vaping status: Never Used  Substance and Sexual Activity   Alcohol use: No   Drug use: No   Sexual activity: Not Currently  Other Topics Concern   Not on file  Social History Narrative   Patient lives with his wife    Patient right handed   Patient drinks caffine on occ.   Social Determinants of Health   Financial Resource Strain: Not on file  Food Insecurity: No Food Insecurity (01/04/2023)   Hunger Vital Sign    Worried About Running Out of Food in the Last Year: Never true    Ran Out of Food in  the Last Year: Never true  Transportation Needs: No Transportation Needs (01/04/2023)   PRAPARE - Administrator, Civil Service (Medical): No    Lack of Transportation (Non-Medical): No  Physical Activity: Not on file  Stress: Not on file  Social Connections: Not on file   Outpatient Encounter Medications as of 06/18/2023  Medication Sig   acetaminophen (TYLENOL) 325 MG tablet Take 650 mg by mouth daily as needed for mild pain, moderate pain or headache.   atenolol (TENORMIN) 25 MG tablet TAKE 1 TABLET BY MOUTH EVERY DAY   atorvastatin (LIPITOR) 80 MG tablet TAKE 1 TABLET BY MOUTH EVERY DAY   B-D UF III MINI PEN NEEDLES 31G X 5 MM MISC USE FOUR TIMES DAILY AS DIRECTED   Calcium Carb-Cholecalciferol 600-500 MG-UNIT CAPS Take 2 capsules by mouth daily.    clopidogrel (PLAVIX) 75 MG tablet TAKE 1 TABLET BY MOUTH EVERY DAY   CONTOUR TEST test strip TEST TWICE DAILY E11.65   darolutamide (NUBEQA) 300 MG tablet Take 2 tablets (600 mg total) by mouth 2 (two) times daily with a meal.   furosemide (LASIX) 20 MG tablet Take 20 mg by mouth 3 (three) times a week.   insulin degludec (TRESIBA FLEXTOUCH) 100 UNIT/ML SOPN FlexTouch Pen Inject 20 Units into the skin at bedtime.   latanoprost (XALATAN) 0.005 % ophthalmic solution Place 1 drop into both eyes at bedtime.   prochlorperazine (COMPAZINE) 10 MG tablet Take 1 tablet (10 mg total) by mouth every 6 (six) hours as needed for nausea or vomiting.   Semaglutide, 1 MG/DOSE, 4 MG/3ML SOPN Inject 1 mg as directed once a week.   Vibegron (GEMTESA) 75 MG TABS Take 1 tablet (75 mg total) by mouth daily.   No facility-administered encounter medications on file as of 06/18/2023.   ALLERGIES: Allergies  Allergen Reactions   Zolpidem Tartrate Other (See Comments)    disorientation    VACCINATION STATUS: Immunization History  Administered Date(s) Administered   Fluad Quad(high Dose 65+) 07/01/2019   Influenza Split 06/30/2013, 08/30/2013,  08/23/2020   Influenza-Unspecified 07/30/2015, 06/30/2018   Moderna Sars-Covid-2 Vaccination 11/20/2019, 12/17/2019   Pneumococcal Conjugate-13 01/08/2018   Pneumococcal Polysaccharide-23 07/31/2007, 08/26/2019   Td 10/30/2002   Tdap 06/19/2016   Zoster, Live 06/19/2016    Diabetes He presents for his follow-up diabetic visit. He has type 2 diabetes mellitus. Onset time: He was diagnosed at approximate age of 41 years. His disease course has been stable. There are no hypoglycemic associated symptoms. Pertinent negatives for hypoglycemia include no confusion, pallor or seizures. There are no diabetic associated symptoms. Pertinent negatives for diabetes include no fatigue, no polydipsia, no polyphagia, no polyuria and no weakness. There are no hypoglycemic complications. Symptoms are stable. Diabetic complications include heart  disease, nephropathy and PVD. (Status post bilateral lower extremity amputations with prosthetics.) Risk factors for coronary artery disease include diabetes mellitus, dyslipidemia, hypertension, male sex, obesity, sedentary lifestyle and tobacco exposure. Current diabetic treatment includes insulin injections (and Ozempic). He is compliant with treatment most of the time. His weight is fluctuating minimally. He is following a diabetic diet. When asked about meal planning, he reported none. He has had a previous visit with a dietitian. He participates in exercise daily (5 x per week). His home blood glucose trend is fluctuating minimally. His overall blood glucose range is 140-180 mg/dl. (He presents today with his meter, no logs, showing stable, at target fasting glycemic profile.  His most recent A1c, checked by PCP was 6.6%, essentially unchanged from previous visit.  He denies any hypoglycemia.  Analysis of his meter shows 7-day average of 105, 14-day average of 145, 30-day average of 144.) An ACE inhibitor/angiotensin II receptor blocker is not being taken. He does not see a  podiatrist.Eye exam is current.  Hyperlipidemia This is a chronic problem. The current episode started more than 1 year ago. The problem is controlled. Recent lipid tests were reviewed and are normal. Exacerbating diseases include chronic renal disease, diabetes and obesity. Factors aggravating his hyperlipidemia include beta blockers. Pertinent negatives include no myalgias. Current antihyperlipidemic treatment includes statins. The current treatment provides moderate improvement of lipids. There are no compliance problems.  Risk factors for coronary artery disease include dyslipidemia, diabetes mellitus, hypertension, male sex, obesity and a sedentary lifestyle.  Hypertension This is a chronic problem. The current episode started more than 1 year ago. The problem is unchanged. The problem is controlled. There are no associated agents to hypertension. Risk factors for coronary artery disease include diabetes mellitus, dyslipidemia, family history, male gender and sedentary lifestyle. Past treatments include beta blockers and diuretics. The current treatment provides moderate improvement. There are no compliance problems.  Hypertensive end-organ damage includes kidney disease and PVD. Identifiable causes of hypertension include chronic renal disease.    Review of systems  Constitutional: + Minimally fluctuating body weight,  current Body mass index is 34.98 kg/m. , no fatigue, no subjective hyperthermia, no subjective hypothermia Eyes: no blurry vision, no xerophthalmia ENT: no sore throat, no nodules palpated in throat, no dysphagia/odynophagia, no hoarseness Cardiovascular: no chest pain, no shortness of breath, no palpitations Respiratory: no cough, no shortness of breath Gastrointestinal: no nausea/vomiting/diarrhea Musculoskeletal: no muscle/joint aches, walks with walker d/t BLE amputations with prosthesis Skin: no rashes, no hyperemia Neurological: no tremors, no numbness, no tingling, no  dizziness Psychiatric: no depression, no anxiety    Objective:    BP 124/64 (BP Location: Left Arm, Patient Position: Sitting, Cuff Size: Large)   Pulse 67   Ht 5\' 11"  (1.803 m)   Wt 250 lb 12.8 oz (113.8 kg)   BMI 34.98 kg/m   Wt Readings from Last 3 Encounters:  06/18/23 250 lb 12.8 oz (113.8 kg)  06/06/23 248 lb 9.6 oz (112.8 kg)  05/22/23 248 lb 11.2 oz (112.8 kg)    BP Readings from Last 3 Encounters:  06/18/23 124/64  06/06/23 (!) 102/58  05/22/23 116/67    Physical Exam- Limited  Constitutional:  Body mass index is 34.98 kg/m., not in acute distress, normal state of mind Eyes:  EOMI, no exophthalmos Musculoskeletal: walks with walker d/t BLE amputations with prosthesis Skin:  no rashes, no hyperemia Neurological: no tremor with outstretched hands    Results for orders placed or performed in visit on  05/15/23  Ferritin  Result Value Ref Range   Ferritin 475 (H) 24 - 336 ng/mL  Iron and TIBC ([facility] ONLY)  Result Value Ref Range   Iron 80 45 - 182 ug/dL   TIBC 220 (L) 254 - 270 ug/dL   Saturation Ratios 37 17.9 - 39.5 %   UIBC 138 ug/dL  PSA  Result Value Ref Range   Prostatic Specific Antigen 0.01 0.00 - 4.00 ng/mL  Comprehensive metabolic panel  Result Value Ref Range   Sodium 142 135 - 145 mmol/L   Potassium 4.0 3.5 - 5.1 mmol/L   Chloride 110 98 - 111 mmol/L   CO2 25 22 - 32 mmol/L   Glucose, Bld 168 (H) 70 - 99 mg/dL   BUN 30 (H) 8 - 23 mg/dL   Creatinine, Ser 6.23 (H) 0.61 - 1.24 mg/dL   Calcium 8.8 (L) 8.9 - 10.3 mg/dL   Total Protein 6.4 (L) 6.5 - 8.1 g/dL   Albumin 2.9 (L) 3.5 - 5.0 g/dL   AST 28 15 - 41 U/L   ALT 38 0 - 44 U/L   Alkaline Phosphatase 109 38 - 126 U/L   Total Bilirubin 0.6 0.3 - 1.2 mg/dL   GFR, Estimated 41 (L) >60 mL/min   Anion gap 7 5 - 15  CBC with Differential  Result Value Ref Range   WBC 5.3 4.0 - 10.5 K/uL   RBC 3.67 (L) 4.22 - 5.81 MIL/uL   Hemoglobin 11.5 (L) 13.0 - 17.0 g/dL   HCT  76.2 (L) 83.1 - 52.0 %   MCV 94.3 80.0 - 100.0 fL   MCH 31.3 26.0 - 34.0 pg   MCHC 33.2 30.0 - 36.0 g/dL   RDW 51.7 61.6 - 07.3 %   Platelets 228 150 - 400 K/uL   nRBC 0.0 0.0 - 0.2 %   Neutrophils Relative % 67 %   Neutro Abs 3.5 1.7 - 7.7 K/uL   Lymphocytes Relative 23 %   Lymphs Abs 1.2 0.7 - 4.0 K/uL   Monocytes Relative 8 %   Monocytes Absolute 0.4 0.1 - 1.0 K/uL   Eosinophils Relative 2 %   Eosinophils Absolute 0.1 0.0 - 0.5 K/uL   Basophils Relative 0 %   Basophils Absolute 0.0 0.0 - 0.1 K/uL   Immature Granulocytes 0 %   Abs Immature Granulocytes 0.02 0.00 - 0.07 K/uL   Complete Blood Count (Most recent): Lab Results  Component Value Date   WBC 5.3 05/15/2023   HGB 11.5 (L) 05/15/2023   HCT 34.6 (L) 05/15/2023   MCV 94.3 05/15/2023   PLT 228 05/15/2023   Chemistry (most recent): Lab Results  Component Value Date   NA 142 05/15/2023   K 4.0 05/15/2023   CL 110 05/15/2023   CO2 25 05/15/2023   BUN 30 (H) 05/15/2023   CREATININE 1.76 (H) 05/15/2023   Diabetic Labs (most recent): Lab Results  Component Value Date   HGBA1C 6.5 (A) 08/15/2022   HGBA1C 6.5 02/13/2022   HGBA1C 7.5 09/01/2021   MICROALBUR 30 08/06/2020   MICROALBUR 0.2 10/31/2016   Lipid Panel     Component Value Date/Time   CHOL 128 09/01/2021 0000   CHOL 113 01/31/2021 0937   TRIG 74 09/01/2021 0000   TRIG 98 10/27/2013 1305   HDL 46 09/01/2021 0000   HDL 47 01/31/2021 0937   HDL 39 (L) 10/27/2013 1305   CHOLHDL 2.4 01/31/2021 0937   CHOLHDL 3.0 02/24/2020 0915   VLDL  14 10/31/2016 1137   LDLCALC 67 09/01/2021 0000   LDLCALC 51 01/31/2021 0937   LDLCALC 50 02/24/2020 0915   LDLCALC 82 10/27/2013 1305    Assessment & Plan:   1) Type 2 diabetes mellitus with other circulatory complications (HCC)  -His diabetes is complicated by extensive peripheral arterial disease with bilateral lower extremity amputations and CKD and he remains at a high risk for more acute and chronic  complications of diabetes which include CAD, CVA, CKD, retinopathy, and neuropathy. These are all discussed in detail with the patient.  He presents today with his meter, no logs, showing stable, at target fasting glycemic profile.  His most recent A1c, checked by PCP was 6.6%, essentially unchanged from previous visit.  He denies any hypoglycemia.  Analysis of his meter shows 7-day average of 105, 14-day average of 145, 30-day average of 144.   -Recent labs reviewed.  - Nutritional counseling repeated at each appointment due to patients tendency to fall back in to old habits.  - The patient admits there is a room for improvement in their diet and drink choices. -  Suggestion is made for the patient to avoid simple carbohydrates from their diet including Cakes, Sweet Desserts / Pastries, Ice Cream, Soda (diet and regular), Sweet Tea, Candies, Chips, Cookies, Sweet Pastries, Store Bought Juices, Alcohol in Excess of 1-2 drinks a day, Artificial Sweeteners, Coffee Creamer, and "Sugar-free" Products. This will help patient to have stable blood glucose profile and potentially avoid unintended weight gain.   - I encouraged the patient to switch to unprocessed or minimally processed complex starch and increased protein intake (animal or plant source), fruits, and vegetables.   - Patient is advised to stick to a routine mealtimes to eat 3 meals a day and avoid unnecessary snacks (to snack only to correct hypoglycemia).  - I have approached patient with the following individualized plan to manage diabetes and patient agrees.  -Given his stable glycemic profile, will continue current medication regimen.   -He is advised to increase his Ozempic to 1 mg SQ weekly (dosage increase form sent to PAP) and once he has started that dose, he will lower his [place] to 20 units SQ nightly.  Ultimate goal is to maximize Ozempic and get off basal insulin in the future.  -He is encouraged to continue monitoring blood  glucose twice daily, before breakfast and before bed, and call the clinic if he gets readings less than 70 or greater than 200 for 3 tests in a row.  - His insurance did not provide coverage for Ozempic nor Trulicity but he does have help with Patient assistance program.  2) Hypertension: His blood pressure is controlled to target.  He is advised to continue Atenolol 25 mg po daily, and Lasix 20 mg po every other day.  3) Lipids/HPL:  His most recent lipid panel from 01/12/23 shows controlled LDL at 50.  He is advised to continue Lipitor 80 mg po daily at bedtime.  Side effects and precautions discussed with him.   - I advised patient to maintain close follow up with Benita Stabile, MD for primary care needs.      I spent  46  minutes in the care of the patient today including review of labs from CMP, Lipids, Thyroid Function, Hematology (current and previous including abstractions from other facilities); face-to-face time discussing  his blood glucose readings/logs, discussing hypoglycemia and hyperglycemia episodes and symptoms, medications doses, his options of short and long term treatment based  on the latest standards of care / guidelines;  discussion about incorporating lifestyle medicine;  and documenting the encounter. Risk reduction counseling performed per USPSTF guidelines to reduce obesity and cardiovascular risk factors.     Please refer to Patient Instructions for Blood Glucose Monitoring and Insulin/Medications Dosing Guide"  in media tab for additional information. Please  also refer to " Patient Self Inventory" in the Media  tab for reviewed elements of pertinent patient history.  Floydene Flock Pugsley participated in the discussions, expressed understanding, and voiced agreement with the above plans.  All questions were answered to his satisfaction. he is encouraged to contact clinic should he have any questions or concerns prior to his return visit.    Follow up plan: -Return in about  4 months (around 10/18/2023) for Diabetes F/U with A1c in office, No previsit labs, Bring meter and logs.  Ronny Bacon, [facility] [address] [place], [place] [address] Phone: [phone] Fax: [phone]  06/18/2023, 10:17 AM

## 2023-07-05 DIAGNOSIS — C61 Malignant neoplasm of prostate: Secondary | ICD-10-CM | POA: Diagnosis not present

## 2023-07-12 ENCOUNTER — Telehealth: Payer: Self-pay | Admitting: Urology

## 2023-07-12 NOTE — Telephone Encounter (Signed)
Patient called and left a voicemail message that he is out of his medication and would like samples of [place]

## 2023-07-12 NOTE — Telephone Encounter (Signed)
Patient is made aware Gemtesa samples upfront . Voiced understanding.

## 2023-07-17 ENCOUNTER — Other Ambulatory Visit: Payer: Self-pay

## 2023-07-25 ENCOUNTER — Telehealth: Payer: Self-pay | Admitting: Nurse Practitioner

## 2023-07-25 NOTE — Telephone Encounter (Signed)
Pt's daughter picked up pt's medication

## 2023-08-07 ENCOUNTER — Telehealth: Payer: Self-pay

## 2023-08-07 NOTE — Telephone Encounter (Signed)
Calling to stop by and pick up samples of  Vibegron (GEMTESA) 75 MG TABS .  Please advise.

## 2023-08-08 NOTE — Telephone Encounter (Signed)
Coming by at 10 am to pick up gemtesa samples.

## 2023-08-15 DIAGNOSIS — H40023 Open angle with borderline findings, high risk, bilateral: Secondary | ICD-10-CM | POA: Diagnosis not present

## 2023-08-17 ENCOUNTER — Telehealth: Payer: Self-pay | Admitting: Nurse Practitioner

## 2023-08-17 NOTE — Telephone Encounter (Signed)
Called pt and made him aware that his pt assistance was here for pickup

## 2023-08-21 ENCOUNTER — Inpatient Hospital Stay: Payer: Medicare Other | Attending: Hematology

## 2023-08-21 DIAGNOSIS — Z79899 Other long term (current) drug therapy: Secondary | ICD-10-CM | POA: Insufficient documentation

## 2023-08-21 DIAGNOSIS — D649 Anemia, unspecified: Secondary | ICD-10-CM | POA: Diagnosis not present

## 2023-08-21 DIAGNOSIS — C61 Malignant neoplasm of prostate: Secondary | ICD-10-CM | POA: Insufficient documentation

## 2023-08-21 DIAGNOSIS — D509 Iron deficiency anemia, unspecified: Secondary | ICD-10-CM

## 2023-08-21 LAB — CBC WITH DIFFERENTIAL/PLATELET
Abs Immature Granulocytes: 0.02 10*3/uL (ref 0.00–0.07)
Basophils Absolute: 0 10*3/uL (ref 0.0–0.1)
Basophils Relative: 0 %
Eosinophils Absolute: 0.1 10*3/uL (ref 0.0–0.5)
Eosinophils Relative: 2 %
HCT: 35.6 % — ABNORMAL LOW (ref 39.0–52.0)
Hemoglobin: 11.8 g/dL — ABNORMAL LOW (ref 13.0–17.0)
Immature Granulocytes: 0 %
Lymphocytes Relative: 30 %
Lymphs Abs: 1.5 10*3/uL (ref 0.7–4.0)
MCH: 31.9 pg (ref 26.0–34.0)
MCHC: 33.1 g/dL (ref 30.0–36.0)
MCV: 96.2 fL (ref 80.0–100.0)
Monocytes Absolute: 0.4 10*3/uL (ref 0.1–1.0)
Monocytes Relative: 8 %
Neutro Abs: 2.9 10*3/uL (ref 1.7–7.7)
Neutrophils Relative %: 60 %
Platelets: 224 10*3/uL (ref 150–400)
RBC: 3.7 MIL/uL — ABNORMAL LOW (ref 4.22–5.81)
RDW: 13.8 % (ref 11.5–15.5)
WBC: 4.9 10*3/uL (ref 4.0–10.5)
nRBC: 0 % (ref 0.0–0.2)

## 2023-08-21 LAB — COMPREHENSIVE METABOLIC PANEL
ALT: 35 U/L (ref 0–44)
AST: 33 U/L (ref 15–41)
Albumin: 2.9 g/dL — ABNORMAL LOW (ref 3.5–5.0)
Alkaline Phosphatase: 94 U/L (ref 38–126)
Anion gap: 8 (ref 5–15)
BUN: 26 mg/dL — ABNORMAL HIGH (ref 8–23)
CO2: 25 mmol/L (ref 22–32)
Calcium: 8.7 mg/dL — ABNORMAL LOW (ref 8.9–10.3)
Chloride: 104 mmol/L (ref 98–111)
Creatinine, Ser: 1.68 mg/dL — ABNORMAL HIGH (ref 0.61–1.24)
GFR, Estimated: 43 mL/min — ABNORMAL LOW (ref >60)
Glucose, Bld: 114 mg/dL — ABNORMAL HIGH (ref 70–99)
Potassium: 4.3 mmol/L (ref 3.5–5.1)
Sodium: 137 mmol/L (ref 135–145)
Total Bilirubin: 0.7 mg/dL (ref 0.3–1.2)
Total Protein: 6.5 g/dL (ref 6.5–8.1)

## 2023-08-21 LAB — IRON AND TIBC
Iron: 80 ug/dL (ref 45–182)
Saturation Ratios: 36 % (ref 17.9–39.5)
TIBC: 221 ug/dL — ABNORMAL LOW (ref 250–450)
UIBC: 141 ug/dL

## 2023-08-21 LAB — PSA: Prostatic Specific Antigen: <0.01 ng/mL (ref 0.00–4.00)

## 2023-08-21 LAB — FERRITIN: Ferritin: 402 ng/mL — ABNORMAL HIGH (ref 24–336)

## 2023-08-21 NOTE — Telephone Encounter (Signed)
Picked up.

## 2023-08-28 ENCOUNTER — Inpatient Hospital Stay (HOSPITAL_BASED_OUTPATIENT_CLINIC_OR_DEPARTMENT_OTHER): Payer: Medicare Other | Admitting: Hematology

## 2023-08-28 VITALS — BP 115/77 | HR 87 | Temp 98.0°F | Resp 16

## 2023-08-28 DIAGNOSIS — C61 Malignant neoplasm of prostate: Secondary | ICD-10-CM

## 2023-08-28 DIAGNOSIS — Z79899 Other long term (current) drug therapy: Secondary | ICD-10-CM | POA: Diagnosis not present

## 2023-08-28 DIAGNOSIS — D509 Iron deficiency anemia, unspecified: Secondary | ICD-10-CM | POA: Diagnosis not present

## 2023-08-28 DIAGNOSIS — D649 Anemia, unspecified: Secondary | ICD-10-CM | POA: Diagnosis not present

## 2023-08-28 NOTE — Telephone Encounter (Signed)
Patient called wants to know if you have any samples of Gemtesa that he can pick up ?

## 2023-08-28 NOTE — Telephone Encounter (Signed)
Rx at pharmacy with refills.

## 2023-08-28 NOTE — Progress Notes (Signed)
[facility] [address] [place] [address]    Clinic Day:  08/28/23   Referring physician: Benita Stabile, MD  Patient Care Team: Benita Stabile, MD as PCP - General (Internal Medicine) Erroll Luna, [facility] (Inactive) as Pharmacist (Pharmacist) Lanelle Bal, DO as Consulting Physician (Internal Medicine) Lanelle Bal, DO as Consulting Physician (Gastroenterology) Doreatha Massed, MD as Medical Oncologist (Medical Oncology) Therese Sarah, RN as Oncology Nurse Navigator (Medical Oncology)   ASSESSMENT & PLAN:   Assessment: 1.  Castration refractory prostate cancer, T3 N1 M0: - 04/27/2017: Prostatic adenocarcinoma Gleason 4+5=9 involving both lobes, PSA 9.8 - Status post long-term ADT plus definitive EBRT from 08/27/2017 through 10/26/2017 - History of radiation-induced cystitis and proctitis - Patient on Eligard every 6 months since 03/19/2020 - PSA <0.1 (09/01/2021), 0.2 (03/09/2022), 0.6 (07/05/2022), 0.9 (09/19/2022), 1.8 (2/29/2022) - PSMA PET (12/14/2022): Marked uptake in the prostate gland compatible with residual/recurrent prostate cancer.  No evidence of nodal or distant metastatic disease.  No focal bone mets. - Darolutamide 600 mg twice daily started on 01/16/2023. - Invitae germline: MSH 3 heterozygous VUS.  No pathogenic variants detected. - CARIS ASSURE (03/21/2023): No pathogenic tumor derived somatic variants detected.  TMB indeterminate.  MSI indeterminate.  Most likely low tumor circulating cells.   2.  Social/family history: - Lives at home with his wife.  Independent of ADLs and IADLs.  He had right BKA and left AKA from diabetes and has prosthesis.  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  He is a non-smoker. - Father had lung cancer.  Maternal grand father had prostate cancer.  Sister had breast cancer.    Plan: 1.  M0 Castrate resistant prostate cancer: - He is tolerating darolutamide very well. -  Reviewed labs today: Normal LFTs.  Creatinine 1.68.  CBC grossly normal.  PSA is less than 0.01. - Continue darolutamide 600 mg twice daily.  RTC 3 months with repeat PSA and other labs.   2.  Bone health: - DEXA scan at [facility] on 06/22/2022 with T-score -0.4, normal. - Continue calcium and vitamin D supplements.  Will plan on repeating DEXA scan in 2025.   3.  Normocytic anemia: - Combination anemia from CKD and functional iron deficiency.  Monoferric on 04/02/2023. - Ferritin is 402 and hemoglobin 11.8.  Percent saturation is 36.  He does not require parenteral iron therapy.    Orders Placed This Encounter  Procedures   CBC with Differential    Standing Status:   Future    Standing Expiration Date:   08/27/2024   Comprehensive metabolic panel    Standing Status:   Future    Standing Expiration Date:   08/27/2024   PSA    Standing Status:   Future    Standing Expiration Date:   08/27/2024   Iron and TIBC ([facility] ONLY)    Standing Status:   Future    Standing Expiration Date:   08/27/2024   Ferritin    Standing Status:   Future    Standing Expiration Date:   08/27/2024     Mikeal Hawthorne R Teague,acting as a scribe for Doreatha Massed, MD.,have documented all relevant documentation on the behalf of Doreatha Massed, MD,as directed by  Doreatha Massed, MD while in the presence of Doreatha Massed, MD.  I, Doreatha Massed MD, have reviewed the above documentation for accuracy and completeness, and I agree with the above.  Doreatha Massed, MD   10/29/20246:38 PM  CHIEF COMPLAINT:   Diagnosis: prostate cancer    Cancer Staging  Prostate cancer [facility]) Staging form: Prostate, AJCC 8th Edition - Clinical: Stage IVA (cT1c, cN1, cM0, PSA: 9.8, Grade Group: 5) - Signed by Margaretmary Dys, MD on 06/09/2017    Prior Therapy: radiation therapy 10/29-12/28/2018   Current Therapy:  Leurpolide injections and Darolutamide    HISTORY OF  PRESENT ILLNESS:   Oncology History   No history exists.     INTERVAL HISTORY:   [name] is a 72 y.o. male presenting to clinic today for follow up of prostate cancer. He was last seen by me on 05/22/23.  Today, he states that he is doing well overall. His appetite level is at 100%. His energy level is at 75%. He is accompanied by his wife.   He is taking nubeqa as prescribed and denies any side effects from treatment including, nausea, vomiting,  joint pain, headaches, or skin rashes. He reports a normal appetite and is active.   PAST MEDICAL HISTORY:   Past Medical History: Past Medical History:  Diagnosis Date   Arthritis    Cerebrovascular disease    MRI shows Right carotid inferior cavernours narrowing 75% and  50-75% stenosis of Cavernous and supraclinoi right side   CKD (chronic kidney disease) 06/28/2014   Sees Dr Lowell Guitar   Diabetic retinopathy [facility])    Hyperlipidemia    Hypertension    Myocardial infarction [facility])    "mild" heart attack   Necrosis (HCC)    #2 nail    Pneumonia    as a child   Poor circulation of extremity    Prostate cancer (HCC) 2017   Prostate   Sleep apnea    uses cpap, getting a new one   Type 2 Diabetes mellitus    Type 2    Surgical History: Past Surgical History:  Procedure Laterality Date   ABOVE KNEE LEG AMPUTATION Left 2004   started out below knee and then extended to above knee due to poor healing   AMPUTATION Right 04/28/2015   Procedure: AMPUTATION RAY, RIGHT 5TH TOE;  Surgeon: Eldred Manges, MD;  Location: [facility] OR;  Service: Orthopedics;  Laterality: Right;   AMPUTATION Right 05/10/2015   Procedure: Right Below Knee Amputation;  Surgeon: Eldred Manges, MD;  [facility] OR;  Service: Orthopedics;  Laterality: Right;   CATARACT EXTRACTION W/PHACO  09/05/2012   Procedure: CATARACT EXTRACTION PHACO AND INTRAOCULAR LENS PLACEMENT (IOC);  Surgeon: Gemma Payor, MD;  Location: [facility] ORS;  Service: Ophthalmology;  Laterality: Left;  CDE=5.45    CATARACT EXTRACTION W/PHACO  10/03/2012   Procedure: CATARACT EXTRACTION PHACO AND INTRAOCULAR LENS PLACEMENT (IOC);  Surgeon: Gemma Payor, MD;  Location: [facility] ORS;  Service: Ophthalmology;  Laterality: Right;  CDE: 12.31   COLONOSCOPY  2011   Dr. Arlyce Dice: colon polyps, tubular adenoma   COLONOSCOPY N/A 11/01/2018   Dr. Jena Gauss: Blood noted in the rectal vault.  Vascular pattern of the rectum abnormal, neovascular changes distally and actively oozing.  There were 3 2 to 5 mm polyps in the splenic flexure, cecum removed.  Cecal polyp was not recovered.  Radiation proctitis status post APC treatment.  The splenic flexure polyps were tubular adenomas.  Next colonoscopy in 5 years.   FLEXIBLE SIGMOIDOSCOPY N/A 10/27/2019   Procedure: FLEXIBLE SIGMOIDOSCOPY;  Surgeon: West Bali, MD; rectal bleeding due to radiation proctitis s/p APC therapy (actively bleeding during exam), rectosigmoid colon and sigmoid  colon appeared normal.   FLEXIBLE SIGMOIDOSCOPY N/A 03/13/2022   Procedure: FLEXIBLE SIGMOIDOSCOPY;  Surgeon: Lanelle Bal, DO;  Location: [facility] ENDO SUITE;  Service: Endoscopy;  Laterality: N/A;  1:45pm   HOT HEMOSTASIS  03/13/2022   Procedure: HOT HEMOSTASIS (ARGON PLASMA COAGULATION/BICAP);  Surgeon: Lanelle Bal, DO;  Location: [facility] END[facility] SUITE;  Service: Endoscopy;;   POLYPECTOMY  11/01/2018   Procedure: POLYPECTOMY;  Surgeon: Corbin Ade, MD;  Location: [facility] END[facility] SU[facility];  Service: Endoscopy;;  cecum,splenic flexure   REFRACTIVE SURGERY Left 08/01/2021   Cleaned cataract    Social History: Social History   Socioeconomic History   Marital status: Married    Spouse name: Not on file   Number of children: 2   Years of education: college   Highest education level: Not on file  Occupational History   Occupation: Engineer, materials: [employer]  Tobacco Use   Smoking status: Never    Passive exposure: Never   Smokeless tobacco: Never  Vaping Use   Vaping status: Never  Used  Substance and Sexual Activity   Alcohol use: No   Drug use: No   Sexual activity: Not Currently  Other Topics Concern   Not on file  Social History Narrative   Patient lives with his wife    Patient right handed   Patient drinks caffine on occ.   Social Determinants of Health   Financial Resource Strain: Low Risk  (07/05/2023)   Received from [facility]   Overall Financial Resource Strain (CARDIA)    Difficulty of Paying Living Expenses: Not hard at all  Food Insecurity: No Food Insecurity (07/05/2023)   Received from [facility]   Hunger Vital Sign    Worried About Running Out of Food in the Last Year: Never true    Ran Out of Food in the Last Year: Never true  Transportation Needs: No Transportation Needs (07/05/2023)   Received from [facility] - Transportation    Lack of Transportation (Medical): No    Lack of Transportation (Non-Medical): No  Physical Activity: Sufficiently Active (07/05/2023)   Received from [facility]   Exercise Vital Sign    Days of Exercise per Week: 6 days    Minutes of Exercise per Session: 30 min  Stress: No Stress Concern Present (07/05/2023)   Received from [facility] - Occupational Stress Questionnaire    Feeling of Stress : Not at all  Social Connections: Socially Integrated (07/05/2023)   Received from [facility]   Social Connection and Isolation Panel [NHANES]    Frequency of Communication with Friends and Family: More than three times a week    Frequency of Social Gatherings with Friends and Family: More than three times a week    Attends Religious Services: More than 4 times per year    Active Member of [employer] or Organizations: Yes    Attends Engineer, structural: More than 4 times per year    Marital Status: Married  Catering manager Violence: Not At Risk (07/05/2023)   Received from [facility]   Humiliation, Afraid, Rape, and Kick questionnaire     Fear of Current or Ex-Partner: No    Emotionally Abused: No    Physically Abused: No    Sexually Abused: No    Family History: Family History  Problem Relation Age of Onset   Diabetes Mother  Hypertension Mother    Lung cancer Father 64   Breast cancer Sister 22   Prostate cancer Maternal Grandfather    Sickle cell anemia Daughter    Prostate cancer Cousin    Prostate cancer Cousin    Colon cancer Neg Hx     Current Medications:  Current Outpatient Medications:    acetaminophen (TYLENOL) 325 MG tablet, Take 650 mg by mouth daily as needed for mild pain, moderate pain or headache., Disp: , Rfl:    atenolol (TENORMIN) 25 MG tablet, TAKE 1 TABLET BY MOUTH EVERY DAY, Disp: 90 tablet, Rfl: 1   atorvastatin (LIPITOR) 80 MG tablet, TAKE 1 TABLET BY MOUTH EVERY DAY, Disp: 90 tablet, Rfl: 1   B-D UF III MINI PEN NEEDLES 31G X 5 MM MISC, USE FOUR TIMES DAILY AS DIRECTED, Disp: 150 each, Rfl: 2   Calcium Carb-Cholecalciferol 600-500 MG-UNIT CAPS, Take 2 capsules by mouth daily. , Disp: , Rfl:    clopidogrel (PLAVIX) 75 MG tablet, TAKE 1 TABLET BY MOUTH EVERY DAY, Disp: 90 tablet, Rfl: 1   CONTOUR TEST test strip, TEST TWICE DAILY E11.65, Disp: 200 each, Rfl: 2   darolutamide (NUBEQA) 300 MG tablet, Take 2 tablets (600 mg total) by mouth 2 (two) times daily with a meal., Disp: 120 tablet, Rfl: 1   furosemide (LASIX) 20 MG tablet, Take 20 mg by mouth 3 (three) times a week., Disp: , Rfl:    insulin degludec (TRESIBA FLEXTOUCH) 100 UNIT/ML SOPN FlexTouch Pen, Inject 20 Units into the skin at bedtime., Disp: , Rfl:    latanoprost (XALATAN) 0.005 % ophthalmic solution, Place 1 drop into both eyes at bedtime., Disp: , Rfl:    prochlorperazine (COMPAZINE) 10 MG tablet, Take 1 tablet (10 mg total) by mouth every 6 (six) hours as needed for nausea or vomiting., Disp: 30 tablet, Rfl: 1   Semaglutide, 1 MG/DOSE, 4 MG/3ML SOPN, Inject 1 mg as directed once a week., Disp: 6 mL, Rfl: 3   Vibegron  (GEMTESA) 75 MG TABS, Take 1 tablet (75 mg total) by mouth daily., Disp: 30 tablet, Rfl: 11   Allergies: Allergies  Allergen Reactions   Zolpidem Tartrate Other (See Comments)    disorientation     REVIEW OF SYSTEMS:   Review of Systems  Constitutional:  Negative for chills, fatigue and fever.  HENT:   Negative for lump/mass, mouth sores, nosebleeds, sore throat and trouble swallowing.   Eyes:  Negative for eye problems.  Respiratory:  Negative for cough and shortness of breath.   Cardiovascular:  Negative for chest pain, leg swelling and palpitations.  Gastrointestinal:  Negative for abdominal pain, constipation, diarrhea, nausea and vomiting.  Genitourinary:  Negative for bladder incontinence, difficulty urinating, dysuria, frequency, hematuria and nocturia.   Musculoskeletal:  Negative for arthralgias, back pain, flank pain, myalgias and neck pain.  Skin:  Negative for itching and rash.  Neurological:  Positive for numbness (in hands). Negative for dizziness and headaches.  Hematological:  Does not bruise/bleed easily.  Psychiatric/Behavioral:  Negative for depression, sleep disturbance and suicidal ideas. The patient is not nervous/anxious.   All other systems reviewed and are negative.    VITALS:   Blood pressure 115/77, pulse 87, temperature 98 F (36.7 C), temperature source Oral, resp. rate 16, SpO2 100%.  Wt Readings from Last 3 Encounters:  06/18/23 250 lb 12.8 oz (113.8 kg)  06/06/23 248 lb 9.6 oz (112.8 kg)  05/22/23 248 lb 11.2 oz (112.8 kg)    There is  no height or weight on file to calculate BMI.  Performance status (ECOG): 1 - Symptomatic but completely ambulatory  PHYSICAL EXAM:   Physical Exam Vitals and nursing note reviewed. Exam conducted with a chaperone present.  Constitutional:      Appearance: Normal appearance.  Cardiovascular:     Rate and Rhythm: Normal rate and regular rhythm.     Pulses: Normal pulses.     Heart sounds: Normal heart  sounds.  Pulmonary:     Effort: Pulmonary effort is normal.     Breath sounds: Normal breath sounds.  Abdominal:     Palpations: Abdomen is soft. There is no hepatomegaly, splenomegaly or mass.     Tenderness: There is no abdominal tenderness.  Musculoskeletal:     Right lower leg: No edema.     Left lower leg: No edema.  Lymphadenopathy:     Cervical: No cervical adenopathy.     Right cervical: No superficial, deep or posterior cervical adenopathy.    Left cervical: No superficial, deep or posterior cervical adenopathy.     Upper Body:     Right upper body: No supraclavicular or axillary adenopathy.     Left upper body: No supraclavicular or axillary adenopathy.  Neurological:     General: No focal deficit present.     Mental Status: He is alert and oriented to person, place, and time.  Psychiatric:        Mood and Affect: Mood normal.        Behavior: Behavior normal.     LABS:      Latest Ref Rng & Units 08/21/2023   10:09 AM 05/15/2023   10:07 AM 03/21/2023    9:09 AM  CBC  WBC 4.0 - 10.5 K/uL 4.9  5.3  5.3   Hemoglobin 13.0 - 17.0 g/dL 61.6  07.3  71.0   Hematocrit 39.0 - 52.0 % 35.6  34.6  34.1   Platelets 150 - 400 K/uL 224  228  205       Latest Ref Rng & Units 08/21/2023   10:09 AM 05/15/2023   10:07 AM 03/21/2023    9:09 AM  CMP  Glucose 70 - 99 mg/dL 626  948  546   BUN 8 - 23 mg/dL 26  30  31    Creatinine 0.61 - 1.24 mg/dL 2.70  3.50  0.93   Sodium 135 - 145 mmol/L 137  142  138   Potassium 3.5 - 5.1 mmol/L 4.3  4.0  4.0   Chloride 98 - 111 mmol/L 104  110  105   CO2 22 - 32 mmol/L 25  25  23    Calcium 8.9 - 10.3 mg/dL 8.7  8.8  8.6   Total Protein 6.5 - 8.1 g/dL 6.5  6.4  6.6   Total Bilirubin 0.3 - 1.2 mg/dL 0.7  0.6  0.9   Alkaline Phos 38 - 126 U/L 94  109  97   AST 15 - 41 U/L 33  28  28   ALT 0 - 44 U/L 35  38  24      No results found for: "CEA1", "CEA" / No results found for: "CEA1", "CEA" Lab Results  Component Value Date   PSA1 1.8  12/28/2022   No results found for: "GHW299" No results found for: "CAN125"  No results found for: "TOTALPROTELP", "ALBUMINELP", "A1GS", "A2GS", "BETS", "BETA2SER", "GAMS", "MSPIKE", "SPEI" Lab Results  Component Value Date   TIBC 221 (L) 08/21/2023   TIBC 218 (  L) 05/15/2023   TIBC 256 02/22/2023   FERRITIN 402 (H) 08/21/2023   FERRITIN 475 (H) 05/15/2023   FERRITIN 63 02/22/2023   IRONPCTSAT 36 08/21/2023   IRONPCTSAT 37 05/15/2023   IRONPCTSAT 27 02/22/2023   No results found for: "LDH"   STUDIES:   No results found.

## 2023-08-29 ENCOUNTER — Telehealth: Payer: Self-pay

## 2023-08-29 DIAGNOSIS — E1122 Type 2 diabetes mellitus with diabetic chronic kidney disease: Secondary | ICD-10-CM | POA: Diagnosis not present

## 2023-08-29 DIAGNOSIS — E559 Vitamin D deficiency, unspecified: Secondary | ICD-10-CM | POA: Diagnosis not present

## 2023-08-29 DIAGNOSIS — E782 Mixed hyperlipidemia: Secondary | ICD-10-CM | POA: Diagnosis not present

## 2023-08-29 LAB — LIPID PANEL
Cholesterol: 125 (ref 0–200)
HDL: 50 (ref 35–70)
LDL Cholesterol: 60
LDl/HDL Ratio: 2.5
Triglycerides: 75 (ref 40–160)

## 2023-08-29 LAB — COMPREHENSIVE METABOLIC PANEL
Albumin: 3.4 — AB (ref 3.5–5.0)
Calcium: 9 (ref 8.7–10.7)
Globulin: 2.9

## 2023-08-29 LAB — HEPATIC FUNCTION PANEL
ALT: 31 U/L (ref 10–40)
AST: 33 (ref 14–40)
Alkaline Phosphatase: 124 (ref 25–125)

## 2023-08-29 LAB — BASIC METABOLIC PANEL
BUN: 31 — AB (ref 4–21)
CO2: 18 (ref 13–22)
Chloride: 109 — AB (ref 99–108)
Creatinine: 1.9 — AB (ref 0.6–1.3)
Glucose: 126
Potassium: 4.5 meq/L (ref 3.5–5.1)
Sodium: 143 (ref 137–147)

## 2023-08-29 LAB — PROTEIN / CREATININE RATIO, URINE
Albumin, U: <3
Creatinine, Urine: 40.7

## 2023-08-29 LAB — MICROALBUMIN / CREATININE URINE RATIO: Microalb Creat Ratio: <7

## 2023-08-29 LAB — HEMOGLOBIN A1C: Hemoglobin A1C: 6.1

## 2023-08-29 LAB — VITAMIN D 25 HYDROXY (VIT D DEFICIENCY, FRACTURES): Vit D, 25-Hydroxy: 43.5

## 2023-08-29 MED ORDER — MIRABEGRON ER 25 MG PO TB24: 25.0000 mg | ORAL_TABLET | Freq: Every day | ORAL | 11 refills | Status: DC

## 2023-08-29 NOTE — Telephone Encounter (Signed)
-----   Message from Bjorn Pippin sent at 08/29/2023  5:00 PM EDT ----- HE can have myrbetriq 25mg  po daily #30 with refills. ----- Message ----- From: Grier Rocher, CMA Sent: 08/29/2023  10:19 AM EDT To: Bjorn Pippin, MD  Are [facility] supply is limited until the beginning of the year per rep- he requested only giving samples to patient's who are trialing the rx.  Patient is asking if we can send Myrbetriq rx tp pharmacy and he can fill generic.

## 2023-08-29 NOTE — Telephone Encounter (Signed)
Patient requested Myrbetriq rx be sent to pharmacy, we are unable to provide samples of Gemtesa at this time.  Patient is aware he can fill the generic Myrbetriq and I will send message to MD confirming it is ok to send in rx. I will notify pt via mychart once rx is sent.

## 2023-08-29 NOTE — Telephone Encounter (Signed)
Rx sent to pharmacy and patient notified via mychart.  

## 2023-09-03 DIAGNOSIS — E113291 Type 2 diabetes mellitus with mild nonproliferative diabetic retinopathy without macular edema, right eye: Secondary | ICD-10-CM | POA: Diagnosis not present

## 2023-09-03 DIAGNOSIS — E782 Mixed hyperlipidemia: Secondary | ICD-10-CM | POA: Diagnosis not present

## 2023-09-03 DIAGNOSIS — K625 Hemorrhage of anus and rectum: Secondary | ICD-10-CM | POA: Diagnosis not present

## 2023-09-03 DIAGNOSIS — H409 Unspecified glaucoma: Secondary | ICD-10-CM | POA: Diagnosis not present

## 2023-09-03 DIAGNOSIS — N1831 Chronic kidney disease, stage 3a: Secondary | ICD-10-CM | POA: Diagnosis not present

## 2023-09-03 DIAGNOSIS — I2510 Atherosclerotic heart disease of native coronary artery without angina pectoris: Secondary | ICD-10-CM | POA: Diagnosis not present

## 2023-09-03 DIAGNOSIS — E11319 Type 2 diabetes mellitus with unspecified diabetic retinopathy without macular edema: Secondary | ICD-10-CM | POA: Diagnosis not present

## 2023-09-03 DIAGNOSIS — E1122 Type 2 diabetes mellitus with diabetic chronic kidney disease: Secondary | ICD-10-CM | POA: Diagnosis not present

## 2023-09-03 DIAGNOSIS — E1151 Type 2 diabetes mellitus with diabetic peripheral angiopathy without gangrene: Secondary | ICD-10-CM | POA: Diagnosis not present

## 2023-09-03 DIAGNOSIS — D631 Anemia in chronic kidney disease: Secondary | ICD-10-CM | POA: Diagnosis not present

## 2023-09-03 DIAGNOSIS — I10 Essential (primary) hypertension: Secondary | ICD-10-CM | POA: Diagnosis not present

## 2023-09-03 DIAGNOSIS — C61 Malignant neoplasm of prostate: Secondary | ICD-10-CM | POA: Diagnosis not present

## 2023-09-06 ENCOUNTER — Encounter: Payer: Self-pay | Admitting: Nurse Practitioner

## 2023-09-17 ENCOUNTER — Telehealth: Payer: Self-pay

## 2023-09-17 ENCOUNTER — Other Ambulatory Visit (HOSPITAL_COMMUNITY): Payer: Self-pay

## 2023-09-17 NOTE — Telephone Encounter (Signed)
Oral Oncology Patient Advocate Encounter   Submitted application for assistance for Nubeqa to [employer] [place] [employer].   Application submitted via e-fax to [phone]   [employer]'s phone number [phone].   I will continue to check the status until final determination.    Ardeen Fillers, CPhT Oncology Pharmacy Patient Advocate  [facility]  [phone] (phone) [phone] (fax) 09/17/2023 3:56 PM

## 2023-09-17 NOTE — Telephone Encounter (Signed)
Oral Oncology Patient Advocate Encounter  Reached out and spoke with patient regarding PAP paperwork, explained that I would send it to their preferred email via [employer].   Confirmed email address: [email or web address].    Patient expressed understanding and consent.  Will follow up once paperwork has been signed and returned.   Ardeen Fillers, CPhT Oncology Pharmacy Patient Advocate  [facility]  [phone] (phone) [phone] (fax) 09/17/2023 12:00 PM

## 2023-09-17 NOTE — Telephone Encounter (Signed)
Oral Oncology Patient Advocate Encounter   Received notification that patient is due for re-enrollment for assistance for Nubeqa through [employer] [place] [employer].   Re-enrollment process has been initiated and will be submitted upon completion of necessary documents.  [employer]'s phone number [phone].   I will continue to follow until final determination.   Ardeen Fillers, CPhT Oncology Pharmacy Patient Advocate  [facility]  [phone] (phone) [phone] (fax) 09/17/2023 10:14 AM

## 2023-09-19 ENCOUNTER — Emergency Department (HOSPITAL_COMMUNITY): Payer: Medicare Other

## 2023-09-19 ENCOUNTER — Emergency Department (HOSPITAL_COMMUNITY)
Admission: EM | Admit: 2023-09-19 | Discharge: 2023-09-19 | Disposition: A | Discharge status: Home / Self Care | Payer: Medicare Other | Attending: Emergency Medicine | Admitting: Emergency Medicine

## 2023-09-19 ENCOUNTER — Encounter (HOSPITAL_COMMUNITY): Payer: Self-pay

## 2023-09-19 ENCOUNTER — Other Ambulatory Visit: Payer: Self-pay

## 2023-09-19 DIAGNOSIS — R55 Syncope and collapse: Secondary | ICD-10-CM | POA: Insufficient documentation

## 2023-09-19 DIAGNOSIS — I10 Essential (primary) hypertension: Secondary | ICD-10-CM | POA: Diagnosis not present

## 2023-09-19 DIAGNOSIS — E1122 Type 2 diabetes mellitus with diabetic chronic kidney disease: Secondary | ICD-10-CM | POA: Insufficient documentation

## 2023-09-19 DIAGNOSIS — R569 Unspecified convulsions: Secondary | ICD-10-CM | POA: Insufficient documentation

## 2023-09-19 DIAGNOSIS — Z8546 Personal history of malignant neoplasm of prostate: Secondary | ICD-10-CM | POA: Diagnosis not present

## 2023-09-19 DIAGNOSIS — R918 Other nonspecific abnormal finding of lung field: Secondary | ICD-10-CM | POA: Diagnosis not present

## 2023-09-19 DIAGNOSIS — I4430 Unspecified atrioventricular block: Secondary | ICD-10-CM | POA: Diagnosis not present

## 2023-09-19 DIAGNOSIS — R110 Nausea: Secondary | ICD-10-CM | POA: Diagnosis not present

## 2023-09-19 DIAGNOSIS — N189 Chronic kidney disease, unspecified: Secondary | ICD-10-CM | POA: Diagnosis not present

## 2023-09-19 DIAGNOSIS — Z7901 Long term (current) use of anticoagulants: Secondary | ICD-10-CM | POA: Diagnosis not present

## 2023-09-19 DIAGNOSIS — R61 Generalized hyperhidrosis: Secondary | ICD-10-CM | POA: Diagnosis not present

## 2023-09-19 DIAGNOSIS — R531 Weakness: Secondary | ICD-10-CM | POA: Diagnosis not present

## 2023-09-19 DIAGNOSIS — R0603 Acute respiratory distress: Secondary | ICD-10-CM | POA: Diagnosis not present

## 2023-09-19 DIAGNOSIS — Z794 Long term (current) use of insulin: Secondary | ICD-10-CM | POA: Insufficient documentation

## 2023-09-19 DIAGNOSIS — K627 Radiation proctitis: Secondary | ICD-10-CM | POA: Diagnosis not present

## 2023-09-19 DIAGNOSIS — I129 Hypertensive chronic kidney disease with stage 1 through stage 4 chronic kidney disease, or unspecified chronic kidney disease: Secondary | ICD-10-CM | POA: Insufficient documentation

## 2023-09-19 DIAGNOSIS — R059 Cough, unspecified: Secondary | ICD-10-CM | POA: Diagnosis not present

## 2023-09-19 LAB — COMPREHENSIVE METABOLIC PANEL
ALT: 34 U/L (ref 0–44)
AST: 35 U/L (ref 15–41)
Albumin: 2.9 g/dL — ABNORMAL LOW (ref 3.5–5.0)
Alkaline Phosphatase: 90 U/L (ref 38–126)
Anion gap: 8 (ref 5–15)
BUN: 29 mg/dL — ABNORMAL HIGH (ref 8–23)
CO2: 27 mmol/L (ref 22–32)
Calcium: 8.5 mg/dL — ABNORMAL LOW (ref 8.9–10.3)
Chloride: 106 mmol/L (ref 98–111)
Creatinine, Ser: 1.82 mg/dL — ABNORMAL HIGH (ref 0.61–1.24)
GFR, Estimated: 39 mL/min — ABNORMAL LOW (ref >60)
Glucose, Bld: 127 mg/dL — ABNORMAL HIGH (ref 70–99)
Potassium: 4.3 mmol/L (ref 3.5–5.1)
Sodium: 141 mmol/L (ref 135–145)
Total Bilirubin: 0.6 mg/dL (ref <1.2)
Total Protein: 6.7 g/dL (ref 6.5–8.1)

## 2023-09-19 LAB — URINALYSIS, ROUTINE W REFLEX MICROSCOPIC
Bilirubin Urine: NEGATIVE
Glucose, UA: NEGATIVE mg/dL
Hgb urine dipstick: NEGATIVE
Ketones, ur: NEGATIVE mg/dL
Leukocytes,Ua: NEGATIVE
Nitrite: NEGATIVE
Protein, ur: NEGATIVE mg/dL
Specific Gravity, Urine: 1.01 (ref 1.005–1.030)
pH: 5 (ref 5.0–8.0)

## 2023-09-19 LAB — CBC WITH DIFFERENTIAL/PLATELET
Abs Immature Granulocytes: 0.06 10*3/uL (ref 0.00–0.07)
Basophils Absolute: 0 10*3/uL (ref 0.0–0.1)
Basophils Relative: 0 %
Eosinophils Absolute: 0.1 10*3/uL (ref 0.0–0.5)
Eosinophils Relative: 1 %
HCT: 35.4 % — ABNORMAL LOW (ref 39.0–52.0)
Hemoglobin: 11.5 g/dL — ABNORMAL LOW (ref 13.0–17.0)
Immature Granulocytes: 1 %
Lymphocytes Relative: 18 %
Lymphs Abs: 1.4 10*3/uL (ref 0.7–4.0)
MCH: 31.2 pg (ref 26.0–34.0)
MCHC: 32.5 g/dL (ref 30.0–36.0)
MCV: 95.9 fL (ref 80.0–100.0)
Monocytes Absolute: 0.8 10*3/uL (ref 0.1–1.0)
Monocytes Relative: 9 %
Neutro Abs: 5.7 10*3/uL (ref 1.7–7.7)
Neutrophils Relative %: 71 %
Platelets: 235 10*3/uL (ref 150–400)
RBC: 3.69 MIL/uL — ABNORMAL LOW (ref 4.22–5.81)
RDW: 13.6 % (ref 11.5–15.5)
WBC: 8 10*3/uL (ref 4.0–10.5)
nRBC: 0 % (ref 0.0–0.2)

## 2023-09-19 LAB — CBG MONITORING, ED: Glucose-Capillary: 129 mg/dL — ABNORMAL HIGH (ref 70–99)

## 2023-09-19 NOTE — ED Triage Notes (Signed)
Pt to er room number 5 via ems, per ems pt is here for fatigue.  Pt states that he has a cough, states that he has had the cough for the past couple of days.

## 2023-09-19 NOTE — ED Notes (Signed)
Pt daughter states pt was unresponsive and he was grasping for breath when EMS was called wife checked his sugar and it was 191. Daughter states that father does not remember any of this happening and would like physician to check on this.

## 2023-09-19 NOTE — ED Provider Notes (Signed)
[place] EMERGENCY DEPARTMENT AT [facility] Provider Note   CSN: [id number] Arrival date & time: 09/19/23  1706     History  Chief Complaint  Patient presents with   Fatigue    [name] is a 72 y.o. male who presents via EMS accompanied by his wife and daughter with concern of episode of AMS fatigue this evening.  His wife states she came into the room and he was breathing abnormally, leaning in his chair, and was momentarily unresponsive.  Patient is diabetic so she checked his sugars which were 150 range.  She called EMS.  Does sound like he was mildly confused following this event.  He has never had a seizure before.  He does note that when he was younger he used to experience syncopal episodes.  Reports that over the past few days he did have a cough.  Right now he is entirely asymptomatic.  When the EMS arrived he learned that he had urinated himself during this event, which is uncommon for him.  He did not hit his head or fall to the ground.  Denies any prodromal symptoms.  HPI    Past Medical History:  Diagnosis Date   Arthritis    Cerebrovascular disease    MRI shows Right carotid inferior cavernours narrowing 75% and  50-75% stenosis of Cavernous and supraclinoi right side   CKD (chronic kidney disease) 06/28/2014   Sees Dr Lowell Guitar   Diabetic retinopathy [facility])    Hyperlipidemia    Hypertension    Myocardial infarction [facility]    "mild" heart attack   Necrosis (HCC)    #2 nail    Pneumonia    as a child   Poor circulation of extremity    Prostate cancer (HCC) 2017   Prostate   Sleep apnea    uses cpap, getting a new one   Type 2 Diabetes mellitus    Type 2     Home Medications Prior to Admission medications   Medication Sig Start Date End Date Taking? Authorizing Provider  acetaminophen (TYLENOL) 325 MG tablet Take 650 mg by mouth daily as needed for mild pain, moderate pain or headache.    [provider]  atenolol (TENORMIN) 25 MG  tablet TAKE 1 TABLET BY MOUTH EVERY DAY 11/09/20   Salley Scarlet, MD  atorvastatin (LIPITOR) 80 MG tablet TAKE 1 TABLET BY MOUTH EVERY DAY 11/03/20   Salley Scarlet, MD  B-D UF III MINI PEN NEEDLES 31G X 5 MM MISC USE FOUR TIMES DAILY AS DIRECTED 09/16/18   Roma Kayser, MD  Calcium Carb-Cholecalciferol 600-500 MG-UNIT CAPS Take 2 capsules by mouth daily.     [provider]  clopidogrel (PLAVIX) 75 MG tablet TAKE 1 TABLET BY MOUTH EVERY DAY 11/09/20   Salley Scarlet, MD  CONTOUR TEST test strip TEST TWICE DAILY E11.65 06/18/18   Roma Kayser, MD  darolutamide (NUBEQA) 300 MG tablet Take 2 tablets (600 mg total) by mouth 2 (two) times daily with a meal. 01/04/23   Doreatha Massed, MD  furosemide (LASIX) 20 MG tablet Take 20 mg by mouth 3 (three) times a week. 07/06/20   [provider]  insulin degludec (TRESIBA FLEXTOUCH) 100 UNIT/ML SOPN FlexTouch Pen Inject 20 Units into the skin at bedtime. 09/18/17   [provider]  latanoprost (XALATAN) 0.005 % ophthalmic solution Place 1 drop into both eyes at bedtime. 01/20/22   [provider]  mirabegron ER (MYRBETRIQ) 25  MG TB24 tablet Take 1 tablet (25 mg total) by mouth daily. 08/29/23   Bjorn Pippin, MD  prochlorperazine (COMPAZINE) 10 MG tablet Take 1 tablet (10 mg total) by mouth every 6 (six) hours as needed for nausea or vomiting. 01/04/23   Doreatha Massed, MD  Semaglutide, 1 MG/DOSE, 4 MG/3ML SOPN Inject 1 mg as directed once a week. 08/15/22   Dani Gobble, NP  Vibegron (GEMTESA) 75 MG TABS Take 1 tablet (75 mg total) by mouth daily. 04/05/23   Bjorn Pippin, MD      Allergies    Zolpidem tartrate    Review of Systems   Review of Systems  Constitutional:  Positive for fatigue.    Physical Exam Updated Vital Signs BP 113/70 (BP Location: Right Arm)   Pulse 70   Temp 98.1 F (36.7 C) (Oral)   Resp 18   Ht 5\' 11"  (1.803 m)   Wt 108.4 kg   SpO2 97%   BMI 33.33 kg/m   Physical Exam Vitals and nursing note reviewed.  Constitutional:      General: He is not in acute distress.    Appearance: He is well-developed.  HENT:     Head: Normocephalic and atraumatic.  Eyes:     Conjunctiva/sclera: Conjunctivae normal.  Cardiovascular:     Rate and Rhythm: Normal rate and regular rhythm.     Heart sounds: No murmur heard. Pulmonary:     Effort: Pulmonary effort is normal. No respiratory distress.     Breath sounds: Normal breath sounds.  Abdominal:     Palpations: Abdomen is soft.     Tenderness: There is no abdominal tenderness.  Musculoskeletal:        General: No swelling.     Cervical back: Neck supple.     Comments: Bilateral BKA  Skin:    General: Skin is warm and dry.     Capillary Refill: Capillary refill takes less than 2 seconds.  Neurological:     General: No focal deficit present.     Mental Status: He is alert and oriented to person, place, and time. Mental status is at baseline.     Cranial Nerves: No cranial nerve deficit.     Sensory: No sensory deficit.     Motor: No weakness.     Coordination: Coordination normal.     Comments: Bilateral BKA, unable to assess gait  Psychiatric:        Mood and Affect: Mood normal.     ED Results / Procedures / Treatments   Labs (all labs ordered are listed, but only abnormal results are displayed) Labs Reviewed - No data to display  EKG None  Radiology No results found.  Procedures Procedures    Medications Ordered in ED Medications - No data to display  ED Course/ Medical Decision Making/ A&P                                 Medical Decision Making  This patient presents to the ED with chief complaint(s) of syncopal like even with pertinent past medical history of carotid cavernous stenosis.  The complaint involves an extensive differential diagnosis and also carries with it a high risk of complications and morbidity.    The differential diagnosis includes  Syncope, seizure,  TIA, arrhythmia, electrolyte abnormality  The initial plan is to  Will start with basic labs, chest x-ray and CT head Additional history obtained: Additional  history obtained from family Records reviewed  EMR  Initial Assessment:   Patient is currently well-appearing, hemodynamically stable, no focal deficits, appears to be back at his baseline per family at bedside.  Etiology is unclear suspect syncope versus seizure.  It appears that he had a brief period of confusion and fatigue following this event similar to postictal, he additionally did experience urinary incontinence during this event, both suspicious for seizure.  Independent ECG/labs interpretation:  The following labs were independently interpreted:  ECG normal sinus rhythm without ischemic changes CBG reassuring, CBC without leukocytosis, CMP without significant abnormality  Independent visualization and interpretation of imaging: I independently visualized the following imaging with scope of interpretation limited to determining acute life threatening conditions related to emergency care: Chest x-ray, which revealed no acute cardiopulmonary disease, CT head without contrast no intracranial abnormality  Treatment and Reassessment: No medications given during this visit.  Upon reassessment patient continues to deny any complaints.  Sitting comfortably in bed eating a sandwich.  Consultations obtained:   None  Disposition:   Patient discharged home with seizure precautions and close neurology follow-up. The patient has been appropriately medically screened and/or stabilized in the ED. I have low suspicion for any other emergent medical condition which would require further screening, evaluation or treatment in the ED or require inpatient management. At time of discharge the patient is hemodynamically stable and in no acute distress. I have discussed work-up results and diagnosis with patient and answered all questions. Patient is  agreeable with discharge plan. We discussed strict return precautions for returning to the emergency department and they verbalized understanding.     Social Determinants of Health:   None  This note was dictated with voice recognition software.  Despite best efforts at proofreading, errors may have occurred which can change the documentation meaning.           Final Clinical Impression(s) / ED Diagnoses Final diagnoses:  None    Rx / DC Orders ED Discharge Orders     None         Fabienne Bruns 09/19/23 2239    Bethann Berkshire, MD 09/21/23 1131

## 2023-09-19 NOTE — Discharge Instructions (Addendum)
It was a pleasure taking care of you this evening.  You were evaluated in the emergency room following being found unresponsive.  As discussed it is unclear whether this was a syncopal episode or a seizure.  However we are treating this as a first-time seizure and I am providing you a referral to see a neurologist.  Please make an appointment with them in the next 2 weeks.  Your CT scan of your head and your lab work are all unremarkable.  Please be sure to avoid any dangerous activity or operating heavy machinery over the next few months until cleared by your neurologist.

## 2023-10-02 DIAGNOSIS — Z713 Dietary counseling and surveillance: Secondary | ICD-10-CM | POA: Diagnosis not present

## 2023-10-02 DIAGNOSIS — Z79899 Other long term (current) drug therapy: Secondary | ICD-10-CM | POA: Diagnosis not present

## 2023-10-02 DIAGNOSIS — N3281 Overactive bladder: Secondary | ICD-10-CM | POA: Diagnosis not present

## 2023-10-02 DIAGNOSIS — I739 Peripheral vascular disease, unspecified: Secondary | ICD-10-CM | POA: Diagnosis not present

## 2023-10-02 DIAGNOSIS — Z7182 Exercise counseling: Secondary | ICD-10-CM | POA: Diagnosis not present

## 2023-10-02 DIAGNOSIS — C61 Malignant neoplasm of prostate: Secondary | ICD-10-CM | POA: Diagnosis not present

## 2023-10-02 DIAGNOSIS — Z6834 Body mass index (BMI) 34.0-34.9, adult: Secondary | ICD-10-CM | POA: Diagnosis not present

## 2023-10-02 DIAGNOSIS — I10 Essential (primary) hypertension: Secondary | ICD-10-CM | POA: Diagnosis not present

## 2023-10-02 DIAGNOSIS — R569 Unspecified convulsions: Secondary | ICD-10-CM | POA: Diagnosis not present

## 2023-10-03 NOTE — Telephone Encounter (Signed)
Oral Oncology Patient Advocate Encounter   Received notification re-enrollment for assistance for Nubeqa through [employer] [facility] has been approved. Patient may continue to receive their medication at $0 from this program.    [employer]'s phone number [phone].   Effective dates: 10/31/23 through 10/29/24.   Ardeen Fillers, CPhT Oncology Pharmacy Patient Advocate  [facility]  [phone] (phone) [phone] (fax) 10/03/2023 8:04 AM

## 2023-10-04 ENCOUNTER — Other Ambulatory Visit: Payer: Self-pay

## 2023-10-10 ENCOUNTER — Other Ambulatory Visit: Payer: Self-pay

## 2023-10-11 ENCOUNTER — Other Ambulatory Visit (HOSPITAL_COMMUNITY): Payer: Self-pay | Admitting: Hematology

## 2023-10-11 ENCOUNTER — Ambulatory Visit: Payer: Medicare Other | Admitting: Urology

## 2023-10-11 VITALS — BP 100/62 | HR 98

## 2023-10-11 DIAGNOSIS — R9721 Rising PSA following treatment for malignant neoplasm of prostate: Secondary | ICD-10-CM

## 2023-10-11 DIAGNOSIS — N3040 Irradiation cystitis without hematuria: Secondary | ICD-10-CM | POA: Diagnosis not present

## 2023-10-11 DIAGNOSIS — N3941 Urge incontinence: Secondary | ICD-10-CM | POA: Diagnosis not present

## 2023-10-11 DIAGNOSIS — Z87440 Personal history of urinary (tract) infections: Secondary | ICD-10-CM

## 2023-10-11 DIAGNOSIS — C61 Malignant neoplasm of prostate: Secondary | ICD-10-CM | POA: Diagnosis not present

## 2023-10-11 LAB — URINALYSIS, ROUTINE W REFLEX MICROSCOPIC
Bilirubin, UA: NEGATIVE
Glucose, UA: NEGATIVE
Ketones, UA: NEGATIVE
Leukocytes,UA: NEGATIVE
Nitrite, UA: NEGATIVE
Protein,UA: NEGATIVE
RBC, UA: NEGATIVE
Specific Gravity, UA: 1.015 (ref 1.005–1.030)
Urobilinogen, Ur: 0.2 mg/dL (ref 0.2–1.0)
pH, UA: 6 (ref 5.0–7.5)

## 2023-10-11 MED ORDER — LEUPROLIDE ACETATE (6 MONTH) 45 MG ~~LOC~~ KIT
45.0000 mg | PACK | Freq: Once | SUBCUTANEOUS | Status: AC
  Administered 2023-10-11: 45 mg via SUBCUTANEOUS

## 2023-10-11 MED ORDER — LEUPROLIDE ACETATE (6 MONTH) 45 MG IM KIT: 45.0000 mg | PACK | Freq: Once | INTRAMUSCULAR | Status: DC

## 2023-10-11 MED ORDER — PREDNISONE 5 MG PO TABS: 5.0000 mg | ORAL_TABLET | Freq: Every day | ORAL | 5 refills | Status: DC

## 2023-10-11 MED ORDER — ABIRATERONE ACETATE 250 MG PO TABS: 1000.0000 mg | ORAL_TABLET | Freq: Every day | ORAL | 0 refills | Status: DC

## 2023-10-11 NOTE — Progress Notes (Signed)
I was informed by Dr. Annabell Howells that [name] had a seizure and was evaluated in the ER on 09/19/2023.  I have called the patient and asked him to stop darolutamide as it may rarely contribute to seizures. I have talked to him about switching to abiraterone with prednisone.  I have sent a prescription to our specialty pharmacy.  He will start abiraterone 1000 mg on an empty stomach daily and take prednisone 5 mg with breakfast daily.  [name] understands and agrees with the plan.

## 2023-10-11 NOTE — Progress Notes (Signed)
Eligard SubQ Injection   Due to Prostate Cancer patient is present today for a Eligard Injection.  Medication: Eligard 6 month Dose: 45 mg  Location: right  Lot: [id number] Exp: 09/29/2024  Patient tolerated well, no complications were noted  Performed by: Gwendolyn Grant CMA

## 2023-10-11 NOTE — Progress Notes (Signed)
Subjective:  1. Rising PSA following treatment for malignant neoplasm of prostate   2. Prostate cancer (HCC)   3. Urge incontinence   4. Radiation cystitis   5. Personal history of urinary infection     10/11/23: [name] returns today in f/u for his history of T3 N1 M0 Gleason 9 prostate cancer with right external iliac nodal disease at diagnosis in 2018 and urge incontinence. He had radiation in 2018.   He remains on Eligard and darolutamide with a PSA of <0.01.  He has OAB and has been on [place] but needs samples today. He has some fatigue with treatment.  He has no weight loss or bone pain.  He has had no hematuria or GI compalints.  He had a possible seizure on 09/19/23  has fully recovered.  HE is scheduled to see Neurology in January.  I have reviewed his ER records.  UA is clear today.   He is due for Eligard today.   04/05/23: [name] returns today in f/u for the history below.  His PSA was up to 1.8 in 2/24 but it is now back to 0.74 after starting darolutamide on 01/16/23.   He is doing well on the med.  He had some fatigue but is better after getting an iron infusion for anemia.   He has no weight loss.  He has no bone pain but he does have some back pain.  His IPSS is 4.  He has no GI complaints.    His labs on 03/21/23 showed a Hgb of 11.2 and a Cr of 1.67 which were stable.  LFT's were normal.  He did better with the Gemtesa than the Mybetriq for the UUI.  His UA is clear.   12/28/22: [name] returns today in f/u for the history below.   His PSA has been rising on Eligard but he didn't get a PSA prior to this visit but had a PSMA PET on 12/14/22 that showed only prostate uptake and no distant disease.  He has been on Eligard with the last dose on 09/28/22.  His IPSS is 9 with some increased frequency and near UUI on Myrbetriq 25mg .   He has no bone pain or weight loss.  He has no GI complaints.   HIs UA today is concerning for infection.   09/28/22: [name] returns today in f/u for his history of  prostate cancer.  His PSA has been rising over the past year from <0.1 on 09/01/21 to 0.2 on 03/09/22, 0.6 on 07/05/22 and 0.9 on 09/19/22.  His last testosterone level was 5 on 07/05/22.  He has been on Eligard with the last dose on 03/16/22.  He had no evidence of mets on his CT in May.  He remains on Myrbetriq 25mg  qd for OAB.  He has CKD3b. He has had no weight loss.  He has had no bone pain.  He is voiding ok with no incontinence but some persistent urgency.  He had hematuria 4-5 months ago but none since.   03/30/22: [name] returns today in f/u for cystoscopy.  The CT hematuria study showed mild bladder wall thickening.    03/16/22: [name] is a 72yo here for followup for a history of T3 N1 M0 Gleason 9 prostate cancer with right external iliac nodal disease at diagnosis in 2018 and urge incontinence. He had radiation in 2018.  He has mild LUTS and mild urgency with mirabegron 25mg  every other day.  He has nocturia x 1.  He has  had no hematuria but his UA today has 3-10 RBC's. PSA is 0.2  and his Testosterone is 10 in 5/22. He is due for eligard today.  UA is clear today.   He has no weight loss or bone pain and he has rare hot flashes.  He had endoscopy on Monday for rectal bleeding from radiation cystitis and had some fulguration.  His Cr is 1.7 with a GFR of 43.   He has not had any abdominal imaging since 2018.           ROS:  ROS:  A complete review of systems was performed.  All systems are negative except for pertinent findings as noted.   Review of Systems  Constitutional:  Positive for malaise/fatigue.  Genitourinary:  Positive for frequency and urgency.  Musculoskeletal:  Positive for back pain and joint pain.  All other systems reviewed and are negative.   Allergies  Allergen Reactions   Zolpidem Tartrate Other (See Comments)    disorientation     Outpatient Encounter Medications as of 10/11/2023  Medication Sig   acetaminophen (TYLENOL) 325 MG tablet Take 650 mg by mouth daily  as needed for mild pain, moderate pain or headache.   atenolol (TENORMIN) 25 MG tablet TAKE 1 TABLET BY MOUTH EVERY DAY   atorvastatin (LIPITOR) 80 MG tablet TAKE 1 TABLET BY MOUTH EVERY DAY   B-D UF III MINI PEN NEEDLES 31G X 5 MM MISC USE FOUR TIMES DAILY AS DIRECTED   Calcium Carb-Cholecalciferol 600-500 MG-UNIT CAPS Take 2 capsules by mouth daily.    clopidogrel (PLAVIX) 75 MG tablet TAKE 1 TABLET BY MOUTH EVERY DAY   CONTOUR TEST test strip TEST TWICE DAILY E11.65   furosemide (LASIX) 20 MG tablet Take 20 mg by mouth 3 (three) times a week.   insulin degludec (TRESIBA FLEXTOUCH) 100 UNIT/ML SOPN FlexTouch Pen Inject 20 Units into the skin at bedtime.   latanoprost (XALATAN) 0.005 % ophthalmic solution Place 1 drop into both eyes at bedtime.   prochlorperazine (COMPAZINE) 10 MG tablet Take 1 tablet (10 mg total) by mouth every 6 (six) hours as needed for nausea or vomiting.   Semaglutide, 1 MG/DOSE, 4 MG/3ML SOPN Inject 1 mg as directed once a week.   Vibegron (GEMTESA) 75 MG TABS Take 1 tablet (75 mg total) by mouth daily.   [DISCONTINUED] darolutamide (NUBEQA) 300 MG tablet Take 2 tablets (600 mg total) by mouth 2 (two) times daily with a meal.   [DISCONTINUED] mirabegron ER (MYRBETRIQ) 25 MG TB24 tablet Take 1 tablet (25 mg total) by mouth daily.   [EXPIRED] leuprolide (6 Month) (ELIGARD) injection 45 mg    [DISCONTINUED] Leuprolide Acetate (6 Month) (LUPRON) injection 45 mg    No facility-administered encounter medications on file as of 10/11/2023.    Past Medical History:  Diagnosis Date   Arthritis    Cerebrovascular disease    MRI shows Right carotid inferior cavernours narrowing 75% and  50-75% stenosis of Cavernous and supraclinoi right side   CKD (chronic kidney disease) 06/28/2014   Sees Dr Lowell Guitar   Diabetic retinopathy [facility])    Hyperlipidemia    Hypertension    Myocardial infarction [facility])    "mild" heart attack   Necrosis (HCC)    #2 nail    Pneumonia    as a child    Poor circulation of extremity    Prostate cancer (HCC) 2017   Prostate   Sleep apnea    uses cpap, getting a new  one   Type 2 Diabetes mellitus    Type 2    Past Surgical History:  Procedure Laterality Date   ABOVE KNEE LEG AMPUTATION Left 2004   started out below knee and then extended to above knee due to poor healing   AMPUTATION Right 04/28/2015   Procedure: AMPUTATION RAY, RIGHT 5TH TOE;  Surgeon: Eldred Manges, MD;  Location: [facility] OR;  Service: Orthopedics;  Laterality: Right;   AMPUTATION Right 05/10/2015   Procedure: Right Below Knee Amputation;  Surgeon: Eldred Manges, MD;  Location: [facility] OR;  Service: Orthopedics;  Laterality: Right;   CATARACT EXTRACTION W/PHACO  09/05/2012   Procedure: CATARACT EXTRACTION PHACO AND INTRAOCULAR LENS PLACEMENT (IOC);  Surgeon: Gemma Payor, MD;  Location: [facility] ORS;  Service: Ophthalmology;  Laterality: Left;  CDE=5.45   CATARACT EXTRACTION W/PHACO  10/03/2012   Procedure: CATARACT EXTRACTION PHACO AND INTRAOCULAR LENS PLACEMENT (IOC);  Surgeon: Gemma Payor, MD;  Location: [facility] ORS;  Service: Ophthalmology;  Laterality: Right;  CDE: 12.31   COLONOSCOPY  2011   Dr. Arlyce Dice: colon polyps, tubular adenoma   COLONOSCOPY N/A 11/01/2018   Dr. Jena Gauss: Blood noted in the rectal vault.  Vascular pattern of the rectum abnormal, neovascular changes distally and actively oozing.  There were 3 2 to 5 mm polyps in the splenic flexure, cecum removed.  Cecal polyp was not recovered.  Radiation proctitis status post APC treatment.  The splenic flexure polyps were tubular adenomas.  Next colonoscopy in 5 years.   FLEXIBLE SIGMOIDOSCOPY N/A 10/27/2019   Procedure: FLEXIBLE SIGMOIDOSCOPY;  Surgeon: West Bali, MD; rectal bleeding due to radiation proctitis s/p APC therapy (actively bleeding during exam), rectosigmoid colon and sigmoid colon appeared normal.   FLEXIBLE SIGMOIDOSCOPY N/A 03/13/2022   Procedure: FLEXIBLE SIGMOIDOSCOPY;  Surgeon: Lanelle Bal, DO;   Location: [facility];  Service: Endoscopy;  Laterality: N/A;  1:45pm   HOT HEMOSTASIS  03/13/2022   Procedure: HOT HEMOSTASIS (ARGON PLASMA COAGULATION/BICAP);  Surgeon: Lanelle Bal, DO;  Location: [facility];  Service: Endoscopy;;   POLYPECTOMY  11/01/2018   Procedure: POLYPECTOMY;  Surgeon: Corbin Ade, MD;  Location: [facility];  Service: Endoscopy;;  cecum,splenic flexure   REFRACTIVE SURGERY Left 08/01/2021   Cleaned cataract    Social History   Socioeconomic History   Marital status: Married    Spouse name: Not on file   Number of children: 2   Years of education: college   Highest education level: Not on file  Occupational History   Occupation: Engineer, materials: [employer]  Tobacco Use   Smoking status: Never    Passive exposure: Never   Smokeless tobacco: Never  Vaping Use   Vaping status: Never Used  Substance and Sexual Activity   Alcohol use: No   Drug use: No   Sexual activity: Not Currently  Other Topics Concern   Not on file  Social History Narrative   Patient lives with his wife    Patient right handed   Patient drinks caffine on occ.   Social Drivers of Corporate investment banker Strain: Low Risk  (07/05/2023)   [facility] from [facility]   Overall Financial Resource Strain (CARDIA)    Difficulty of Paying Living Expenses: Not hard at all  Food Insecurity: No Food Insecurity (07/05/2023)   [facility]   Hunger Vital Sign    Worried About Running Out of Food in the Last  Year: Never true    Ran Out of Food in the Last Year: Never true  Transportation Needs: No Transportation Needs (07/05/2023)   Received from [facility] - Transportation    Lack of Transportation (Medical): No    Lack of Transportation (Non-Medical): No  Physical Activity: Sufficiently Active (07/05/2023)   Received from [facility]   Exercise Vital Sign    Days of Exercise per Week: 6 days    Minutes of Exercise  per Session: 30 min  Stress: No Stress Concern Present (07/05/2023)   Received from [facility] - Occupational Stress Questionnaire    Feeling of Stress : Not at all  Social Connections: Socially Integrated (07/05/2023)   Received from [facility]   Social Connection and Isolation Panel [NHANES]    Frequency of Communication with Friends and Family: More than three times a week    Frequency of Social Gatherings with Friends and Family: More than three times a week    Attends Religious Services: More than 4 times per year    Active Member of [employer] or Organizations: Yes    Attends Engineer, structural: More than 4 times per year    Marital Status: Married  Catering manager Violence: Not At Risk (07/05/2023)   Received from [facility]   Humiliation, Afraid, Rape, and Kick questionnaire    Fear of Current or Ex-Partner: No    Emotionally Abused: No    Physically Abused: No    Sexually Abused: No    Family History  Problem Relation Age of Onset   Diabetes Mother    Hypertension Mother    Lung cancer Father 42   Breast cancer Sister 9   Prostate cancer Maternal Grandfather    Sickle cell anemia Daughter    Prostate cancer Cousin    Prostate cancer Cousin    Colon cancer Neg Hx        Objective: Vitals:   10/11/23 1119  BP: 100/62  Pulse: 98      Physical Exam Vitals reviewed.  Constitutional:      Appearance: Normal appearance.  Neurological:     Mental Status: He is alert.     Lab Results:  PSA PSA  Date Value Ref Range Status  03/16/2020 <0.1 < OR = 4.0 ng/mL Final    Comment:    The total PSA value from this assay system is  standardized against the WHO standard. The test  result will be approximately 20% lower when compared  to the equimolar-standardized total PSA (Beckman  Coulter). Comparison of serial PSA results should be  interpreted with this fact in mind. . This test was performed  using the Siemens  chemiluminescent method. Values obtained from  different assay methods cannot be used interchangeably. PSA levels, regardless of value, should not be interpreted as absolute evidence of the presence or absence of disease.   01/03/2017 6.8 (H) <=4.0 ng/mL Final    Comment:      The total PSA value from this assay system is standardized against the WHO standard. The test result will be approximately 20% lower when compared to the equimolar-standardized total PSA (Beckman Coulter). Comparison of serial PSA results should be interpreted with this fact in mind.   This test was performed using the Siemens chemiluminescent method. Values obtained from different assay methods cannot be used interchangeably. PSA levels, regardless of value, should not be interpreted as absolute  evidence of the presence or absence of disease.      Testosterone  Date Value Ref Range Status  12/28/2022 10 (L) 264 - 916 ng/dL Final    Comment:    Adult male reference interval is based on a population of healthy nonobese males (BMI <30) between 31 and 40 years old. Travison, et.al. JCEM [phone]. PMID: 91478295.   07/05/2022 5 (L) 264 - 916 ng/dL Final    Comment:    Adult male reference interval is based on a population of healthy nonobese males (BMI <30) between 43 and 12 years old. Travison, et.al. JCEM [phone]. PMID: 95284132.   03/09/2022 10 (L) 264 - 916 ng/dL Final    Comment:    Adult male reference interval is based on a population of healthy nonobese males (BMI <30) between 104 and 29 years old. Travison, et.al. JCEM [phone]. PMID: [id number].    Lab Results  Component Value Date   PSA1 1.8 12/28/2022   PSA1 0.9 09/19/2022   PSA1 0.6 07/05/2022  PSA on 02/08/23 was 0.74 UA reviewed and clear.   Results for orders placed or performed in visit on 10/11/23 (from the past 24 hours)  Urinalysis, Routine w reflex microscopic     Status: None    Collection Time: 10/11/23 11:22 AM  Result Value Ref Range   Specific Gravity, UA 1.015 1.005 - 1.030   pH, UA 6.0 5.0 - 7.5   Color, UA Yellow Yellow   Appearance Ur Clear Clear   Leukocytes,UA Negative Negative   Protein,UA Negative Negative/Trace   Glucose, UA Negative Negative   Ketones, UA Negative Negative   RBC, UA Negative Negative   Bilirubin, UA Negative Negative   Urobilinogen, Ur 0.2 0.2 - 1.0 mg/dL   Nitrite, UA Negative Negative   Microscopic Examination Comment    Narrative   Performed at:  [address] - [facility] [address], [place], [place]  [id number] Lab Director: Chinita Pester MT, Phone:  [phone]      Studies/Results: No results found.  CT Head Wo Contrast Result Date: 09/19/2023 CLINICAL DATA:  Seizure EXAM: CT HEAD WITHOUT CONTRAST TECHNIQUE: Contiguous axial images were obtained from the base of the skull through the vertex without intravenous contrast. RADIATION DOSE REDUCTION: This exam was performed according to the departmental dose-optimization program which includes automated exposure control, adjustment of the mA and/or kV according to patient size and/or use of iterative reconstruction technique. COMPARISON:  07/06/2014 FINDINGS: Brain: No acute intracranial abnormality. Specifically, no hemorrhage, hydrocephalus, mass lesion, acute infarction, or significant intracranial injury. Vascular: No hyperdense vessel or unexpected calcification. Skull: No acute calvarial abnormality. Sinuses/Orbits: No acute findings Other: None IMPRESSION: No acute intracranial abnormality. Electronically Signed   By: Charlett Nose M.D.   On: 09/19/2023 22:07   DG Chest Portable 1 View Result Date: 09/19/2023 CLINICAL DATA:  Respiratory distress EXAM: PORTABLE CHEST 1 VIEW COMPARISON:  09/27/2022 FINDINGS: Heart and mediastinal contours are within normal limits. Reticulonodular densities noted within the lungs bilaterally. No visible effusions or pneumothorax. No  acute bony abnormality. IMPRESSION: Reticulonodular interstitial opacities within the lungs bilaterally. This could reflect chronic interstitial lung disease or atypical infection. Electronically Signed   By: Charlett Nose M.D.   On: 09/19/2023 22:06   Records from Dr. Ellin Saba reviewed.     Assessment & Plan: Castrate Resistant Prostate cancer.   He has no mets on PSMA PET but he does have locally recurrent disease on Eligard.  His PSA is falling on the Darolutamide.  He was not felt to be a candidate for additional radiation to the prostate.  Urge incontinence.  He he has some increased OAB symptoms and I will switch him to Gemtesa samples since we can't increase the Myrbetriq with his CKD.  We will work on getting a PA for [employer].  Hx of UTI.  UA is clear.  Possible seizure.   I will let Dr. Ellin Saba know so he can declde whether he needs to change to abiaterone.  Meds ordered this encounter  Medications   DISCONTD: Leuprolide Acetate (6 Month) (LUPRON) injection 45 mg   leuprolide (6 Month) (ELIGARD) injection 45 mg     Orders Placed This Encounter  Procedures   Urinalysis, Routine w reflex microscopic       Return in about 6 months (around 04/10/2024) for for Eligard. .   CC: Benita Stabile, MD  and Dr. Doreatha Massed.     Bjorn Pippin 10/12/2023 Patient ID: [name], male   DOB: 06-29-1951, 72 y.o.   MRN: [id number]

## 2023-10-18 ENCOUNTER — Ambulatory Visit (INDEPENDENT_AMBULATORY_CARE_PROVIDER_SITE_OTHER): Payer: Medicare Other | Admitting: Nurse Practitioner

## 2023-10-18 ENCOUNTER — Encounter: Payer: Self-pay | Admitting: Nurse Practitioner

## 2023-10-18 VITALS — BP 129/85 | HR 89 | Wt 248.0 lb

## 2023-10-18 DIAGNOSIS — I10 Essential (primary) hypertension: Secondary | ICD-10-CM | POA: Diagnosis not present

## 2023-10-18 DIAGNOSIS — E559 Vitamin D deficiency, unspecified: Secondary | ICD-10-CM

## 2023-10-18 DIAGNOSIS — E1159 Type 2 diabetes mellitus with other circulatory complications: Secondary | ICD-10-CM | POA: Diagnosis not present

## 2023-10-18 DIAGNOSIS — E782 Mixed hyperlipidemia: Secondary | ICD-10-CM | POA: Diagnosis not present

## 2023-10-18 DIAGNOSIS — Z794 Long term (current) use of insulin: Secondary | ICD-10-CM | POA: Diagnosis not present

## 2023-10-18 DIAGNOSIS — Z7985 Long-term (current) use of injectable non-insulin antidiabetic drugs: Secondary | ICD-10-CM

## 2023-10-18 NOTE — Progress Notes (Signed)
10/18/2023   Endocrinology follow-up note    Subjective:    Patient ID: [name], male    DOB: February 26, 1951, PCP Benita Stabile, MD   Past Medical History:  Diagnosis Date   Arthritis    Cerebrovascular disease    MRI shows Right carotid inferior cavernours narrowing 75% and  50-75% stenosis of Cavernous and supraclinoi right side   CKD (chronic kidney disease) 06/28/2014   Sees Dr Lowell Guitar   Diabetic retinopathy [facility])    Hyperlipidemia    Hypertension    Myocardial infarction [facility])    "mild" heart attack   Necrosis (HCC)    #2 nail    Pneumonia    as a child   Poor circulation of extremity    Prostate cancer (HCC) 2017   Prostate   Sleep apnea    uses cpap, getting a new one   Type 2 Diabetes mellitus    Type 2   Past Surgical History:  Procedure Laterality Date   ABOVE KNEE LEG AMPUTATION Left 2004   started out below knee and then extended to above knee due to poor healing   AMPUTATION Right 04/28/2015   Procedure: AMPUTATION RAY, RIGHT 5TH TOE;  Surgeon: Eldred Manges, MD;  Location: [facility] OR;  Service: Orthopedics;  Laterality: Right;   AMPUTATION Right 05/10/2015   Procedure: Right Below Knee Amputation;  Surgeon: Eldred Manges, MD;  Location: [facility] OR;  Service: Orthopedics;  Laterality: Right;   CATARACT EXTRACTION W/PHACO  09/05/2012   Procedure: CATARACT EXTRACTION PHACO AND INTRAOCULAR LENS PLACEMENT (IOC);  Surgeon: Gemma Payor, MD;  Location: [facility] ORS;  Service: Ophthalmology;  Laterality: Left;  CDE=5.45   CATARACT EXTRACTION W/PHACO  10/03/2012   Procedure: CATARACT EXTRACTION PHACO AND INTRAOCULAR LENS PLACEMENT (IOC);  Surgeon: Gemma Payor, MD;  Location: [facility] ORS;  Service: Ophthalmology;  Laterality: Right;  CDE: 12.31   COLONOSCOPY  2011   Dr. Arlyce Dice: colon polyps, tubular adenoma   COLONOSCOPY N/A 11/01/2018   Dr. Jena Gauss: Blood noted in the rectal vault.  Vascular pattern of the rectum abnormal, neovascular changes distally and actively oozing.  There were 3 2 to  5 mm polyps in the splenic flexure, cecum removed.  Cecal polyp was not recovered.  Radiation proctitis status post APC treatment.  The splenic flexure polyps were tubular adenomas.  Next colonoscopy in 5 years.   FLEXIBLE SIGMOIDOSCOPY N/A 10/27/2019   Procedure: FLEXIBLE SIGMOIDOSCOPY;  Surgeon: West Bali, MD; rectal bleeding due to radiation proctitis s/p APC therapy (actively bleeding during exam), rectosigmoid colon and sigmoid colon appeared normal.   FLEXIBLE SIGMOIDOSCOPY N/A 03/13/2022   Procedure: FLEXIBLE SIGMOIDOSCOPY;  Surgeon: Lanelle Bal, DO;  Location[facility];  Service: Endoscopy;  Laterality: N/A;  1:45pm   HOT HEMOSTASIS  03/13/2022   Procedure: HOT HEMOSTASIS (ARGON PLASMA COAGULATION/BICAP);  Surgeon: Lanelle Bal, DO;  Location: [facility];  Service: Endoscopy;;   POLYPECTOMY  11/01/2018   Procedure: POLYPECTOMY;  Surgeon: Corbin Ade, MD;  Location: [facility];  Service: Endoscopy;;  cecum,splenic flexure   REFRACTIVE SURGERY Left 08/01/2021   Cleaned cataract   Social History   Socioeconomic History   Marital status: Married    Spouse name: Not on file   Number of children: 2   Years of education: college   Highest education level: Not on file  Occupational History   Occupation: Education officer, environmental    Employer: [employer]  Tobacco Use   Smoking  status: Never    Passive exposure: Never   Smokeless tobacco: Never  Vaping Use   Vaping status: Never Used  Substance and Sexual Activity   Alcohol use: No   Drug use: No   Sexual activity: Not Currently  Other Topics Concern   Not on file  Social History Narrative   Patient lives with his wife    Patient right handed   Patient drinks caffine on occ.   Social Drivers of Corporate investment banker Strain: Low Risk  (07/05/2023)   Received from [facility]   Overall Financial Resource Strain (CARDIA)    Difficulty of Paying Living Expenses: Not hard at all  Food  Insecurity: No Food Insecurity (07/05/2023)   Received from [facility]   Hunger Vital Sign    Worried About Running Out of Food in the Last Year: Never true    Ran Out of Food in the Last Year: Never true  Transportation Needs: No Transportation Needs (07/05/2023)   Received from [facility] - Transportation    Lack of Transportation (Medical): No    Lack of Transportation (Non-Medical): No  Physical Activity: Sufficiently Active (07/05/2023)   Received [facility]   Exercise Vital Sign    Days of Exercise per Week: 6 days    Minutes of Exercise per Session: 30 min  Stress: No Stress Concern Present (07/05/2023)   Received from [facility] - Occupational Stress Questionnaire    Feeling of Stress : Not at all  Social Connections: Socially Integrated (07/05/2023)   Received from [facility]   Social Connection and Isolation Panel [NHANES]    Frequency of Communication with Friends and Family: More than three times a week    Frequency of Social Gatherings with Friends and Family: More than three times a week    Attends Religious Services: More than 4 times per year    Active Member of [employer] or Organizations: Yes    Attends Banker Meetings: More than 4 times per year    Marital Status: Married   Outpatient Encounter Medications as of 10/18/2023  Medication Sig   abiraterone acetate (ZYTIGA) 250 MG tablet Take 4 tablets (1,000 mg total) by mouth daily. Take on an empty stomach 1 hour before or 2 hours after a meal   acetaminophen (TYLENOL) 325 MG tablet Take 650 mg by mouth daily as needed for mild pain, moderate pain or headache.   atenolol (TENORMIN) 25 MG tablet TAKE 1 TABLET BY MOUTH EVERY DAY   atorvastatin (LIPITOR) 80 MG tablet TAKE 1 TABLET BY MOUTH EVERY DAY   B-D UF III MINI PEN NEEDLES 31G X 5 MM MISC USE FOUR TIMES DAILY AS DIRECTED   Calcium Carb-Cholecalciferol 600-500 MG-UNIT CAPS Take 2  capsules by mouth daily.    clopidogrel (PLAVIX) 75 MG tablet TAKE 1 TABLET BY MOUTH EVERY DAY   CONTOUR TEST test strip TEST TWICE DAILY E11.65   furosemide (LASIX) 20 MG tablet Take 20 mg by mouth 3 (three) times a week.   insulin degludec (TRESIBA FLEXTOUCH) 100 UNIT/ML SOPN FlexTouch Pen Inject 20 Units into the skin at bedtime.   latanoprost (XALATAN) 0.005 % ophthalmic solution Place 1 drop into both eyes at bedtime.   predniSONE (DELTASONE) 5 MG tablet Take 1 tablet (5 mg total) by mouth daily with breakfast.   prochlorperazine (COMPAZINE) 10 MG tablet Take 1 tablet (10  mg total) by mouth every 6 (six) hours as needed for nausea or vomiting.   Semaglutide, 1 MG/DOSE, 4 MG/3ML SOPN Inject 1 mg as directed once a week.   Vibegron (GEMTESA) 75 MG TABS Take 1 tablet (75 mg total) by mouth daily.   No facility-administered encounter medications on file as of 10/18/2023.   ALLERGIES: Allergies  Allergen Reactions   Zolpidem Tartrate Other (See Comments)    disorientation    VACCINATION STATUS: Immunization History  Administered Date(s) Administered   Fluad Quad(high Dose 65+) 07/01/2019   Influenza Split 06/30/2013, 08/30/2013, 08/23/2020   Influenza, High Dose Seasonal PF 08/21/2023   Influenza-Unspecified 08/28/2003, 07/30/2015, 06/30/2018   Moderna Sars-Covid-2 Vaccination 11/20/2019, 12/17/2019   Pneumococcal Conjugate-13 01/08/2018   Pneumococcal Polysaccharide-23 07/31/2007, 08/26/2019   Td 10/30/2002   Tdap 06/19/2016   Zoster, Live 06/19/2016    Diabetes He presents for his follow-up diabetic visit. He has type 2 diabetes mellitus. Onset time: He was diagnosed at approximate age of 41 years. His disease course has been stable. There are no hypoglycemic associated symptoms. Pertinent negatives for hypoglycemia include no confusion, pallor or seizures. There are no diabetic associated symptoms. Pertinent negatives for diabetes include no fatigue, no polydipsia, no  polyphagia, no polyuria and no weakness. There are no hypoglycemic complications. Symptoms are stable. Diabetic complications include heart disease, nephropathy and PVD. (Status post bilateral lower extremity amputations with prosthetics.) Risk factors for coronary artery disease include diabetes mellitus, dyslipidemia, hypertension, male sex, obesity, sedentary lifestyle and tobacco exposure. Current diabetic treatment includes insulin injections (and Ozempic). He is compliant with treatment most of the time. His weight is fluctuating minimally. He is following a diabetic diet. When asked about meal planning, he reported none. He has had a previous visit with a dietitian. He participates in exercise daily (5 x per week). His home blood glucose trend is fluctuating minimally. His overall blood glucose range is 110-130 mg/dl. (He presents today with his meter, no logs, showing stable, at target fasting glycemic profile.  His most recent A1c, checked by PCP was 6.1%, improving from last visit of 6.6%.  He denies any hypoglycemia.  ) An ACE inhibitor/angiotensin II receptor blocker is not being taken. He does not see a podiatrist.Eye exam is current.  Hyperlipidemia This is a chronic problem. The current episode started more than 1 year ago. The problem is controlled. Recent lipid tests were reviewed and are normal. Exacerbating diseases include chronic renal disease, diabetes and obesity. Factors aggravating his hyperlipidemia include beta blockers. Pertinent negatives include no myalgias. Current antihyperlipidemic treatment includes statins. The current treatment provides moderate improvement of lipids. There are no compliance problems.  Risk factors for coronary artery disease include dyslipidemia, diabetes mellitus, hypertension, male sex, obesity and a sedentary lifestyle.  Hypertension This is a chronic problem. The current episode started more than 1 year ago. The problem is unchanged. The problem is  controlled. There are no associated agents to hypertension. Risk factors for coronary artery disease include diabetes mellitus, dyslipidemia, family history, male gender and sedentary lifestyle. Past treatments include beta blockers and diuretics. The current treatment provides moderate improvement. There are no compliance problems.  Hypertensive end-organ damage includes kidney disease and PVD. Identifiable causes of hypertension include chronic renal disease.    Review of systems  Constitutional: + Minimally fluctuating body weight,  current Body mass index is 34.59 kg/m. , no fatigue, no subjective hyperthermia, no subjective hypothermia Eyes: no blurry vision, no xerophthalmia ENT: no sore throat, no nodules  palpated in throat, no dysphagia/odynophagia, no hoarseness Cardiovascular: no chest pain, no shortness of breath, no palpitations Respiratory: no cough, no shortness of breath Gastrointestinal: no nausea/vomiting/diarrhea Musculoskeletal: no muscle/joint aches, walks with walker d/t BLE amputations with prosthesis Skin: no rashes, no hyperemia Neurological: no tremors, no numbness, no tingling, no dizziness Psychiatric: no depression, no anxiety    Objective:    BP 129/85 (BP Location: Right Arm, Patient Position: Sitting, Cuff Size: Large)   Pulse 89   Wt 248 lb (112.5 kg)   BMI 34.59 kg/m   Wt Readings from Last 3 Encounters:  10/18/23 248 lb (112.5 kg)  09/19/23 239 lb (108.4 kg)  06/18/23 250 lb 12.8 oz (113.8 kg)    BP Readings from Last 3 Encounters:  10/18/23 129/85  10/11/23 100/62  09/19/23 110/72    Physical Exam- Limited  Constitutional:  Body mass index is 34.59 kg/m., not in acute distress, normal state of mind Eyes:  EOMI, no exophthalmos Musculoskeletal: walks with walker d/t BLE amputations with prosthesis Skin:  no rashes, no hyperemia Neurological: no tremor with outstretched hands    Results for orders placed or performed in visit on  10/11/23  Urinalysis, Routine w reflex microscopic   Collection Time: 10/11/23 11:22 AM  Result Value Ref Range   Specific Gravity, UA 1.015 1.005 - 1.030   pH, UA 6.0 5.0 - 7.5   Color, UA Yellow Yellow   Appearance Ur Clear Clear   Leukocytes,UA Negative Negative   Protein,UA Negative Negative/Trace   Glucose, UA Negative Negative   Ketones, UA Negative Negative   RBC, UA Negative Negative   Bilirubin, UA Negative Negative   Urobilinogen, Ur 0.2 0.2 - 1.0 mg/dL   Nitrite, UA Negative Negative   Microscopic Examination Comment    Complete Blood Count (Most recent): Lab Results  Component Value Date   WBC 8.0 09/19/2023   HGB 11.5 (L) 09/19/2023   HCT 35.4 (L) 09/19/2023   MCV 95.9 09/19/2023   PLT 235 09/19/2023   Chemistry (most recent): Lab Results  Component Value Date   NA 141 09/19/2023   K 4.3 09/19/2023   CL 106 09/19/2023   CO2 27 09/19/2023   BUN 29 (H) 09/19/2023   CREATININE 1.82 (H) 09/19/2023   Diabetic Labs (most recent): Lab Results  Component Value Date   HGBA1C 6.1 08/29/2023   HGBA1C 6.5 (A) 08/15/2022   HGBA1C 6.5 02/13/2022   MICROALBUR 30 08/06/2020   MICROALBUR 0.2 10/31/2016   Lipid Panel     Component Value Date/Time   CHOL 125 08/29/2023 0000   CHOL 113 01/31/2021 0937   TRIG 75 08/29/2023 0000   TRIG 98 10/27/2013 1305   HDL 50 08/29/2023 0000   HDL 47 01/31/2021 0937   HDL 39 (L) 10/27/2013 1305   CHOLHDL 2.4 01/31/2021 0937   CHOLHDL 3.0 02/24/2020 0915   VLDL 14 10/31/2016 1137   LDLCALC 60 08/29/2023 0000   LDLCALC 51 01/31/2021 0937   LDLCALC 50 02/24/2020 0915   LDLCALC 82 10/27/2013 1305    Assessment & Plan:   1) Type 2 diabetes mellitus with other circulatory complications (HCC)  -His diabetes is complicated by extensive peripheral arterial disease with bilateral lower extremity amputations and CKD and he remains at a high risk for more acute and chronic complications of diabetes which include CAD, CVA, CKD,  retinopathy, and neuropathy. These are all discussed in detail with the patient.  He presents today with his meter, no logs, showing stable,  at target fasting glycemic profile.  His most recent A1c, checked by PCP was 6.1%, improving from last visit of 6.6%.  He denies any hypoglycemia.     -Recent labs reviewed.  - Nutritional counseling repeated at each appointment due to patients tendency to fall back in to old habits.  - The patient admits there is a room for improvement in their diet and drink choices. -  Suggestion is made for the patient to avoid simple carbohydrates from their diet including Cakes, Sweet Desserts / Pastries, Ice Cream, Soda (diet and regular), Sweet Tea, Candies, Chips, Cookies, Sweet Pastries, Store Bought Juices, Alcohol in Excess of 1-2 drinks a day, Artificial Sweeteners, Coffee Creamer, and "Sugar-free" Products. This will help patient to have stable blood glucose profile and potentially avoid unintended weight gain.   - I encouraged the patient to switch to unprocessed or minimally processed complex starch and increased protein intake (animal or plant source), fruits, and vegetables.   - Patient is advised to stick to a routine mealtimes to eat 3 meals a day and avoid unnecessary snacks (to snack only to correct hypoglycemia).  - I have approached patient with the following individualized plan to manage diabetes and patient agrees.  -Given his stable glycemic profile, will continue current medication regimen.   -He is advised to continue Ozempic 1 mg SQ weekly and Tresiba 20 units SQ nightly.  Ultimate goal is to maximize Ozempic and get off basal insulin in the future.  Will look to advance his Ozempic at next visit.  -He is encouraged to continue monitoring blood glucose twice daily, before breakfast and before bed, and call the clinic if he gets readings less than 70 or greater than 200 for 3 tests in a row.  - His insurance did not provide coverage for  Ozempic nor Trulicity but he does have help with Patient assistance program.  2) Hypertension: His blood pressure is controlled to target.  He is advised to continue Atenolol 25 mg po daily, and Lasix 20 mg po every other day.  3) Lipids/HPL:  His most recent lipid panel from 08/29/23 shows controlled LDL at 60.  He is advised to continue Lipitor 80 mg po daily at bedtime.  Side effects and precautions discussed with him.   - I advised patient to maintain close follow up with Benita Stabile, MD for primary care needs.      I spent  26  minutes in the care of the patient today including review of labs from CMP, Lipids, Thyroid Function, Hematology (current and previous including abstractions from other facilities); face-to-face time discussing  his blood glucose readings/logs, discussing hypoglycemia and hyperglycemia episodes and symptoms, medications doses, his options of short and long term treatment based on the latest standards of care / guidelines;  discussion about incorporating lifestyle medicine;  and documenting the encounter. Risk reduction counseling performed per USPSTF guidelines to reduce obesity and cardiovascular risk factors.     Please refer to Patient Instructions for Blood Glucose Monitoring and Insulin/Medications Dosing Guide"  in media tab for additional information. Please  also refer to " Patient Self Inventory" in the Media  tab for reviewed elements of pertinent patient history.  Floydene Flock Vanaken participated in the discussions, expressed understanding, and voiced agreement with the above plans.  All questions were answered to his satisfaction. he is encouraged to contact clinic should he have any questions or concerns prior to his return visit.    Follow up plan: -Return in about  6 months (around 04/17/2024) for Diabetes F/U with A1c in office, No previsit labs, Bring meter and logs.  Ronny Bacon, [facility] [address] [place], [place] [address] Phone: [phone] Fax: [phone]  10/18/2023, 11:49 AM

## 2023-10-19 ENCOUNTER — Other Ambulatory Visit: Payer: Self-pay

## 2023-10-26 ENCOUNTER — Other Ambulatory Visit: Payer: Self-pay

## 2023-10-26 DIAGNOSIS — C61 Malignant neoplasm of prostate: Secondary | ICD-10-CM

## 2023-10-29 ENCOUNTER — Telehealth: Payer: Self-pay

## 2023-10-29 ENCOUNTER — Other Ambulatory Visit: Payer: Self-pay | Admitting: *Deleted

## 2023-10-29 ENCOUNTER — Telehealth: Payer: Self-pay | Admitting: Pharmacist

## 2023-10-29 ENCOUNTER — Inpatient Hospital Stay: Payer: Medicare Other | Attending: Hematology | Admitting: Hematology

## 2023-10-29 ENCOUNTER — Inpatient Hospital Stay: Payer: Medicare Other

## 2023-10-29 ENCOUNTER — Other Ambulatory Visit (HOSPITAL_COMMUNITY): Payer: Self-pay

## 2023-10-29 VITALS — BP 110/75 | HR 84 | Temp 98.7°F | Resp 16 | Wt 248.4 lb

## 2023-10-29 DIAGNOSIS — C61 Malignant neoplasm of prostate: Secondary | ICD-10-CM | POA: Diagnosis not present

## 2023-10-29 DIAGNOSIS — D509 Iron deficiency anemia, unspecified: Secondary | ICD-10-CM

## 2023-10-29 DIAGNOSIS — Z79899 Other long term (current) drug therapy: Secondary | ICD-10-CM | POA: Diagnosis not present

## 2023-10-29 DIAGNOSIS — Z923 Personal history of irradiation: Secondary | ICD-10-CM | POA: Diagnosis not present

## 2023-10-29 DIAGNOSIS — D631 Anemia in chronic kidney disease: Secondary | ICD-10-CM | POA: Diagnosis not present

## 2023-10-29 DIAGNOSIS — Z8546 Personal history of malignant neoplasm of prostate: Secondary | ICD-10-CM | POA: Diagnosis not present

## 2023-10-29 DIAGNOSIS — N189 Chronic kidney disease, unspecified: Secondary | ICD-10-CM | POA: Insufficient documentation

## 2023-10-29 LAB — COMPREHENSIVE METABOLIC PANEL
ALT: 39 U/L (ref 0–44)
AST: 36 U/L (ref 15–41)
Albumin: 2.8 g/dL — ABNORMAL LOW (ref 3.5–5.0)
Alkaline Phosphatase: 93 U/L (ref 38–126)
Anion gap: 7 (ref 5–15)
BUN: 25 mg/dL — ABNORMAL HIGH (ref 8–23)
CO2: 26 mmol/L (ref 22–32)
Calcium: 8.9 mg/dL (ref 8.9–10.3)
Chloride: 105 mmol/L (ref 98–111)
Creatinine, Ser: 1.46 mg/dL — ABNORMAL HIGH (ref 0.61–1.24)
GFR, Estimated: 51 mL/min — ABNORMAL LOW (ref >60)
Glucose, Bld: 130 mg/dL — ABNORMAL HIGH (ref 70–99)
Potassium: 4.4 mmol/L (ref 3.5–5.1)
Sodium: 138 mmol/L (ref 135–145)
Total Bilirubin: 0.6 mg/dL (ref 0.0–1.2)
Total Protein: 6.7 g/dL (ref 6.5–8.1)

## 2023-10-29 LAB — CBC WITH DIFFERENTIAL/PLATELET
Abs Immature Granulocytes: 0.03 10*3/uL (ref 0.00–0.07)
Basophils Absolute: 0 10*3/uL (ref 0.0–0.1)
Basophils Relative: 0 %
Eosinophils Absolute: 0.2 10*3/uL (ref 0.0–0.5)
Eosinophils Relative: 3 %
HCT: 35.2 % — ABNORMAL LOW (ref 39.0–52.0)
Hemoglobin: 11.4 g/dL — ABNORMAL LOW (ref 13.0–17.0)
Immature Granulocytes: 1 %
Lymphocytes Relative: 23 %
Lymphs Abs: 1.4 10*3/uL (ref 0.7–4.0)
MCH: 30.7 pg (ref 26.0–34.0)
MCHC: 32.4 g/dL (ref 30.0–36.0)
MCV: 94.9 fL (ref 80.0–100.0)
Monocytes Absolute: 0.5 10*3/uL (ref 0.1–1.0)
Monocytes Relative: 7 %
Neutro Abs: 4 10*3/uL (ref 1.7–7.7)
Neutrophils Relative %: 66 %
Platelets: 252 10*3/uL (ref 150–400)
RBC: 3.71 MIL/uL — ABNORMAL LOW (ref 4.22–5.81)
RDW: 13.5 % (ref 11.5–15.5)
WBC: 6.1 10*3/uL (ref 4.0–10.5)
nRBC: 0 % (ref 0.0–0.2)

## 2023-10-29 LAB — MAGNESIUM: Magnesium: 2 mg/dL (ref 1.7–2.4)

## 2023-10-29 MED ORDER — ABIRATERONE ACETATE 250 MG PO TABS: 1000.0000 mg | ORAL_TABLET | Freq: Every day | ORAL | 0 refills | Status: DC

## 2023-10-29 NOTE — Telephone Encounter (Signed)
Oral Oncology Patient Advocate Encounter  Prior Authorization for Abiraterone has been approved.    PA# [id number]  Effective dates: 10/29/23 through 10/29/98  Patients co-pay is $292.49.   [facility] Price would be $140.00.  [employer] [facility] price would be ~ $90 w/ shipping.    Ardeen Fillers, CPhT Oncology Pharmacy Patient Advocate  [facility]  [phone] (phone) [phone] (fax) 10/29/2023 12:28 PM

## 2023-10-29 NOTE — Telephone Encounter (Signed)
Clinical Pharmacist Practitioner Encounter   Received new prescription for Zytiga (abiraterone) for the treatment of castration resistant prostate cancer in conjunction with prednisone and ADT, planned duration until disease progression or unacceptable drug toxicity.  CMP from 10/29/23 assessed, no relevant lab abnormalities. Prescription dose and frequency assessed.   Current medication list in Epic reviewed, one DDIs with abiraterone identified: Atorvastatin: Abiraterone Acetate may enhance the myopathic (rhabdomyolysis) effect of HMG-CoA Reductase Inhibitors (Statins), such as atorvastatin. Monitor for evidence of muscle toxicities (eg, myopathy, rhabdomyolysis). No baseline dose adjustment needed.   Evaluated chart and no patient barriers to medication adherence identified.   Prescription has been e-scribed to the [facility] for benefits analysis and approval.  Oral Oncology Clinic will continue to follow for insurance authorization, copayment issues, initial counseling and start date.   Remi Haggard, PharmD, BCPS, BCOP, CPP Hematology/Oncology Clinical Pharmacist Practitioner [place] [phone]  10/29/2023 12:07 PM

## 2023-10-29 NOTE — Telephone Encounter (Signed)
Called and spoke to patient regarding different price options for Abiraterone. Patient would like to use [facility] pharmacy to fill medication. Patient will call me back at [phone] once account is created so we can triage Abiraterone script over to [facility] for processing and fulfillment. I will continue to update until shipment of medication is confirmed.    Ardeen Fillers, CPhT Oncology Pharmacy Patient Advocate  [facility]  [phone] (phone) [phone] (fax) 10/29/2023 3:03 PM

## 2023-10-29 NOTE — Telephone Encounter (Signed)
Clinical Pharmacist Practitioner Encounter   Patient Education I spoke with patient for overview of new oral chemotherapy medication:Zytiga (abiraterone) for the treatment of castration resistant prostate cancer in conjunction with prednisone and ADT, planned duration until disease progression or unacceptable drug toxicity.   Counseled patient on administration, dosing, side effects, monitoring, drug-food interactions, safe handling, storage, and disposal. Patient will take 4 tablets (1,000 mg total) by mouth daily. Take on an empty stomach 1 hour before or 2 hours after a meal.  *Patient reports already having his prednisone on hand  Side effects include but not limited to: hypertension, fatigue, edema.    Reviewed with patient importance of keeping a medication schedule and plan for any missed doses.  After discussion with patient no patient barriers to medication adherence identified.   Mr. Szekely voiced understanding and appreciation. All questions answered. Medication handout provided.  Provided patient with Oral Chemotherapy Navigation Clinic phone number. Patient knows to call the office with questions or concerns. Oral Chemotherapy Navigation Clinic will continue to follow.  Remi Haggard, PharmD, BCPS, BCOP, CPP Hematology/Oncology Clinical Pharmacist Practitioner [place] [phone]  10/29/2023 1:47 PM

## 2023-10-29 NOTE — Telephone Encounter (Signed)
Oral Oncology Patient Advocate Encounter  New authorization   Received notification that prior authorization for Abiraterone is required.   PA submitted on 10/29/23  Key [id number]  Status is pending     Ardeen Fillers, CPhT Oncology Pharmacy Patient Advocate  [facility]  [phone] (phone) [phone] (fax) 10/29/2023 12:10 PM

## 2023-10-29 NOTE — Patient Instructions (Addendum)
[facility] at [facility] Discharge Instructions   You were seen and examined today by Dr. Ellin Saba.  He reviewed the results of your lab work which are normal/stable.   We have reached out to the specialty pharmacy to see what the hold up is in getting your new medication (Zytiga) delivered to you. Once you get this pill you may start taking it along with the prednisone.   We will see you back in one month. We will repeat lab work at that time.   Return as scheduled.    Thank you for choosing [facility] at [facility] to provide your oncology and hematology care.  To afford each patient quality time with our provider, please arrive at least 15 minutes before your scheduled appointment time.   If you have a lab appointment with the Cancer Center please come in thru the Main Entrance and check in at the main information desk.  You need to re-schedule your appointment should you arrive 10 or more minutes late.  We strive to give you quality time with our providers, and arriving late affects you and other patients whose appointments are after yours.  Also, if you no show three or more times for appointments you may be dismissed from the clinic at the providers discretion.     Again, thank you for choosing [facility].  Our hope is that these requests will decrease the amount of time that you wait before being seen by our physicians.       _____________________________________________________________  Should you have questions after your visit to [facility], please contact our office at [phone] and follow the prompts.  Our office hours are 8:00 a.m. and 4:30 p.m. Monday - Friday.  Please note that voicemails left after 4:00 p.m. may not be returned until the following business day.  We are closed weekends and major holidays.  You do have access to a nurse 24-7, just call the main number to the clinic [phone] and  do not press any options, hold on the line and a nurse will answer the phone.    For prescription refill requests, have your pharmacy contact our office and allow 72 hours.    Due to Covid, you will need to wear a mask upon entering the hospital. If you do not have a mask, a mask will be given to you at the Main Entrance upon arrival. For doctor visits, patients may have 1 support person age 57 or older with them. For treatment visits, patients can not have anyone with them due to social distancing guidelines and our immunocompromised population.

## 2023-10-29 NOTE — Progress Notes (Signed)
[facility] [address] [place] [address]    Clinic Day:  10/29/23   Referring physician: Benita Stabile, MD  Patient Care Team: Benita Stabile, MD as PCP - General (Internal Medicine) Erroll Luna, [facility] (Inactive) as Pharmacist (Pharmacist) Lanelle Bal, DO as Consulting Physician (Internal Medicine) Lanelle Bal, DO as Consulting Physician (Gastroenterology) Doreatha Massed, MD as Medical Oncologist (Medical Oncology) Therese Sarah, RN as Oncology Nurse Navigator (Medical Oncology)   ASSESSMENT & PLAN:   Assessment: 1.  Castration refractory prostate cancer, T3 N1 M0: - 04/27/2017: Prostatic adenocarcinoma Gleason 4+5=9 involving both lobes, PSA 9.8 - Status post long-term ADT plus definitive EBRT from 08/27/2017 through 10/26/2017 - History of radiation-induced cystitis and proctitis - Patient on Eligard every 6 months since 03/19/2020 - PSA <0.1 (09/01/2021), 0.2 (03/09/2022), 0.6 (07/05/2022), 0.9 (09/19/2022), 1.8 (2/29/2022) - PSMA PET (12/14/2022): Marked uptake in the prostate gland compatible with residual/recurrent prostate cancer.  No evidence of nodal or distant metastatic disease.  No focal bone mets. - Darolutamide 600 mg twice daily started on 01/16/2023.  Discontinued on 10/11/2023 as he developed seizures. - Invitae germline: MSH 3 heterozygous VUS.  No pathogenic variants detected. - CARIS ASSURE (03/21/2023): No pathogenic tumor derived somatic variants detected.  TMB indeterminate.  MSI indeterminate.  Most likely low tumor circulating cells. - Abiraterone and prednisone started on   2.  Social/family history: - Lives at home with his wife.  Independent of ADLs and IADLs.  He had right BKA and left AKA from diabetes and has prosthesis.  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  He is a non-smoker. - Father had lung cancer.  Maternal grand father had prostate cancer.  Sister had breast cancer.     Plan: 1.  M0 Castrate resistant prostate cancer: - He has developed seizures while on darolutamide. - I have called him on 10/11/2023 and discontinue darolutamide.  He did not have any further seizures. - We talked about starting him on abiraterone 1000 mg daily with prednisone 5 mg daily.  We discussed side effects including but not limited to hypertension, hypokalemia, fatigue, LFT abnormalities among others.  He is agreeable.  Will  send prescription to our specialty pharmacy. - Labs today: Normal LFTs.  Creatinine 1.46 and improved.  Hemoglobin 11.4.  Last PSA was undetectable on 08/21/2023. - He will start abiraterone and take it on empty stomach.  I will see him back in 4 weeks for follow-up.   2.  Bone health: - DEXA scan at [facility] on 06/22/2022: T-score -0.4. - Continue calcium and vitamin D supplements.  Will repeat DEXA scan in 2025.   3.  Normocytic anemia: - Combination anemia from CKD and functional iron deficiency.  Received Monoferric on 04/02/2023.  Last ferritin was 402 and percent saturation was 36.  Hemoglobin today is 11.4.  Will repeat ferritin and iron panel in the next 1 to 2 months.    No orders of the defined types were placed in this encounter.      Doreatha Massed, MD   12/30/202412:18 PM  CHIEF COMPLAINT:   Diagnosis: prostate cancer    Cancer Staging  Prostate cancer [facility]) Staging form: Prostate, AJCC 8th Edition - Clinical: Stage IVA (cT1c, cN1, cM0, PSA: 9.8, Grade Group: 5) - Signed by Margaretmary Dys, MD on 06/09/2017    Prior Therapy: radiation therapy 10/29-12/28/2018   Current Therapy:  Leurpolide injections and Darolutamide  HISTORY OF PRESENT ILLNESS:   Oncology History   No history exists.     INTERVAL HISTORY:   [name] is a 72 y.o. male seen for follow-up of prostate cancer.  He is accompanied by his wife.  He was evaluated in the ER on 09/19/2023 with seizure activity.  Today he reports appetite 100% and energy  levels of 80%.  PAST MEDICAL HISTORY:   Past Medical History: Past Medical History:  Diagnosis Date   Arthritis    Cerebrovascular disease    MRI shows Right carotid inferior cavernours narrowing 75% and  50-75% stenosis of Cavernous and supraclinoi right side   CKD (chronic kidney disease) 06/28/2014   Sees Dr Lowell Guitar   Diabetic retinopathy [facility])    Hyperlipidemia    Hypertension    Myocardial infarction [facility])    "mild" heart attack   Necrosis (HCC)    #2 nail    Pneumonia    as a child   Poor circulation of extremity    Prostate cancer (HCC) 2017   Prostate   Sleep apnea    uses cpap, getting a new one   Type 2 Diabetes mellitus    Type 2    Surgical History: Past Surgical History:  Procedure Laterality Date   ABOVE KNEE LEG AMPUTATION Left 2004   started out below knee and then extended to above knee due to poor healing   AMPUTATION Right 04/28/2015   Procedure: AMPUTATION RAY, RIGHT 5TH TOE;  Surgeon: Eldred Manges, MD;  Location: [facility] OR;  Service: Orthopedics;  Laterality: Right;   AMPUTATION Right 05/10/2015   Procedure: Right Below Knee Amputation;  Surgeon: Eldred Manges, MD;  Location: [facility] OR;  Service: Orthopedics;  Laterality: Right;   CATARACT EXTRACTION W/PHACO  09/05/2012   Procedure: CATARACT EXTRACTION PHACO AND INTRAOCULAR LENS PLACEMENT (IOC);  Surgeon: Gemma Payor, MD;  Location: [facility] ORS;  Service: Ophthalmology;  Laterality: Left;  CDE=5.45   CATARACT EXTRACTION W/PHACO  10/03/2012   Procedure: CATARACT EXTRACTION PHACO AND INTRAOCULAR LENS PLACEMENT (IOC);  Surgeon: Gemma Payor, MD;  Location: [facility] ORS;  Service: Ophthalmology;  Laterality: Right;  CDE: 12.31   COLONOSCOPY  2011   Dr. Arlyce Dice: colon polyps, tubular adenoma   COLONOSCOPY N/A 11/01/2018   Dr. Jena Gauss: Blood noted in the rectal vault.  Vascular pattern of the rectum abnormal, neovascular changes distally and actively oozing.  There were 3 2 to 5 mm polyps in the splenic flexure, cecum removed.  Cecal  polyp was not recovered.  Radiation proctitis status post APC treatment.  The splenic flexure polyps were tubular adenomas.  Next colonoscopy in 5 years.   FLEXIBLE SIGMOIDOSCOPY N/A 10/27/2019   Procedure: FLEXIBLE SIGMOIDOSCOPY;  Surgeon: West Bali, MD; rectal bleeding due to radiation proctitis s/p APC therapy (actively bleeding during exam), rectosigmoid colon and sigmoid colon appeared normal.   FLEXIBLE SIGMOIDOSCOPY N/A 03/13/2022   Procedure: FLEXIBLE SIGMOIDOSCOPY;  Surgeon: Lanelle Bal, DO;  Location: [facility] ENDO SUITE;  Service: Endoscopy;  Laterality: N/A;  1:45pm   HOT HEMOSTASIS  03/13/2022   Procedure: HOT HEMOSTASIS (ARGON PLASMA COAGULATION/BICAP);  Surgeon: Lanelle Bal, DO;  Location: [facility] END[facility];  Service: Endoscopy;;   POLYPECTOMY  11/01/2018   Procedure: POLYPECTOMY;  Surgeon: Corbin Ade, MD;  Location: [facility];  Service: Endoscopy;;  cecum,splenic flexure   REFRACTIVE SURGERY Left 08/01/2021   Cleaned cataract    Social History: Social History   Socioeconomic History   Marital status: Married  Spouse name: Not on file   Number of children: 2   Years of education: college   Highest education level: Not on file  Occupational History   Occupation: Engineer, materials: [employer]  Tobacco Use   Smoking status: Never    Passive exposure: Never   Smokeless tobacco: Never  Vaping Use   Vaping status: Never Used  Substance and Sexual Activity   Alcohol use: No   Drug use: No   Sexual activity: Not Currently  Other Topics Concern   Not on file  Social History Narrative   Patient lives with his wife    Patient right handed   Patient drinks caffine on occ.   Social Drivers of Corporate investment banker Strain: Low Risk  (07/05/2023)   Received from [facility]   Overall Financial Resource Strain (CARDIA)    Difficulty of Paying Living Expenses: Not hard at all  Food Insecurity: No Food Insecurity (07/05/2023)    Received from [facility]   Hunger Vital Sign    Worried About Running Out of Food in the Last Year: Never true    Ran Out of Food in the Last Year: Never true  Transportation Needs: No Transportation Needs (07/05/2023)   Received from [facility] - Transportation    Lack of Transportation (Medical): No    Lack of Transportation (Non-Medical): No  Physical Activity: Sufficiently Active (07/05/2023)   Received from [facility]   Exercise Vital Sign    Days of Exercise per Week: 6 days    Minutes of Exercise per Session: 30 min  Stress: No Stress Concern Present (07/05/2023)   Received from [facility] - Occupational Stress Questionnaire    Feeling of Stress : Not at all  Social Connections: Socially Integrated (07/05/2023)   Received from [facility]   Social Connection and Isolation Panel [NHANES]    Frequency of Communication with Friends and Family: More than three times a week    Frequency of Social Gatherings with Friends and Family: More than three times a week    Attends Religious Services: More than 4 times per year    Active Member of [employer] or Organizations: Yes    Attends Engineer, structural: More than 4 times per year    Marital Status: Married  Catering manager Violence: Not At Risk (07/05/2023)   [facility] from [facility]   Humiliation, Afraid, Rape, and Kick questionnaire    Fear of Current or Ex-Partner: No    Emotionally Abused: No    Physically Abused: No    Sexually Abused: No    Family History: Family History  Problem Relation Age of Onset   Diabetes Mother    Hypertension Mother    Lung cancer Father 42   Breast cancer Sister 77   Prostate cancer Maternal Grandfather    Sickle cell anemia Daughter    Prostate cancer Cousin    Prostate cancer Cousin    Colon cancer Neg Hx     Current Medications:  Current Outpatient Medications:    abiraterone acetate (ZYTIGA) 250 MG  tablet, Take 4 tablets (1,000 mg total) by mouth daily. Take on an empty stomach 1 hour before or 2 hours after a meal, Disp: 120 tablet, Rfl: 0   acetaminophen (TYLENOL) 325 MG tablet, Take 650 mg by mouth daily as needed for mild pain, moderate pain  or headache., Disp: , Rfl:    atenolol (TENORMIN) 25 MG tablet, TAKE 1 TABLET BY MOUTH EVERY DAY, Disp: 90 tablet, Rfl: 1   atorvastatin (LIPITOR) 80 MG tablet, TAKE 1 TABLET BY MOUTH EVERY DAY, Disp: 90 tablet, Rfl: 1   B-D UF III MINI PEN NEEDLES 31G X 5 MM MISC, USE FOUR TIMES DAILY AS DIRECTED, Disp: 150 each, Rfl: 2   Calcium Carb-Cholecalciferol 600-500 MG-UNIT CAPS, Take 2 capsules by mouth daily. , Disp: , Rfl:    clopidogrel (PLAVIX) 75 MG tablet, TAKE 1 TABLET BY MOUTH EVERY DAY, Disp: 90 tablet, Rfl: 1   CONTOUR TEST test strip, TEST TWICE DAILY E11.65, Disp: 200 each, Rfl: 2   furosemide (LASIX) 20 MG tablet, Take 20 mg by mouth 3 (three) times a week., Disp: , Rfl:    insulin degludec (TRESIBA FLEXTOUCH) 100 UNIT/ML SOPN FlexTouch Pen, Inject 20 Units into the skin at bedtime., Disp: , Rfl:    latanoprost (XALATAN) 0.005 % ophthalmic solution, Place 1 drop into both eyes at bedtime., Disp: , Rfl:    predniSONE (DELTASONE) 5 MG tablet, Take 1 tablet (5 mg total) by mouth daily with breakfast., Disp: 30 tablet, Rfl: 5   prochlorperazine (COMPAZINE) 10 MG tablet, Take 1 tablet (10 mg total) by mouth every 6 (six) hours as needed for nausea or vomiting., Disp: 30 tablet, Rfl: 1   Semaglutide, 1 MG/DOSE, 4 MG/3ML SOPN, Inject 1 mg as directed once a week., Disp: 6 mL, Rfl: 3   Vibegron (GEMTESA) 75 MG TABS, Take 1 tablet (75 mg total) by mouth daily., Disp: 30 tablet, Rfl: 11   Allergies: Allergies  Allergen Reactions   Zolpidem Tartrate Other (See Comments)    disorientation     REVIEW OF SYSTEMS:   Review of Systems  Constitutional:  Negative for chills, fatigue and fever.  HENT:   Negative for lump/mass, mouth sores, nosebleeds,  sore throat and trouble swallowing.   Eyes:  Negative for eye problems.  Respiratory:  Negative for cough and shortness of breath.   Cardiovascular:  Negative for chest pain, leg swelling and palpitations.  Gastrointestinal:  Negative for abdominal pain, constipation, diarrhea, nausea and vomiting.  Genitourinary:  Negative for bladder incontinence, difficulty urinating, dysuria, frequency, hematuria and nocturia.   Musculoskeletal:  Negative for arthralgias, back pain, flank pain, myalgias and neck pain.  Skin:  Negative for itching and rash.  Neurological:  Positive for numbness (in hands). Negative for dizziness and headaches.  Hematological:  Does not bruise/bleed easily.  Psychiatric/Behavioral:  Negative for depression, sleep disturbance and suicidal ideas. The patient is not nervous/anxious.   All other systems reviewed and are negative.    VITALS:   Blood pressure 110/75, pulse 84, temperature 98.7 F (37.1 C), temperature source Oral, resp. rate 16, weight 248 lb 6.4 oz (112.7 kg), SpO2 100%.  Wt Readings from Last 3 Encounters:  10/29/23 248 lb 6.4 oz (112.7 kg)  10/18/23 248 lb (112.5 kg)  09/19/23 239 lb (108.4 kg)    Body mass index is 34.64 kg/m.  Performance status (ECOG): 1 - Symptomatic but completely ambulatory  PHYSICAL EXAM:   Physical Exam Vitals and nursing note reviewed. Exam conducted with a chaperone present.  Constitutional:      Appearance: Normal appearance.  Cardiovascular:     Rate and Rhythm: Normal rate and regular rhythm.     Pulses: Normal pulses.     Heart sounds: Normal heart sounds.  Pulmonary:     Effort:  Pulmonary effort is normal.     Breath sounds: Normal breath sounds.  Abdominal:     Palpations: Abdomen is soft. There is no hepatomegaly, splenomegaly or mass.     Tenderness: There is no abdominal tenderness.  Musculoskeletal:     Right lower leg: No edema.     Left lower leg: No edema.  Lymphadenopathy:     Cervical: No  cervical adenopathy.     Right cervical: No superficial, deep or posterior cervical adenopathy.    Left cervical: No superficial, deep or posterior cervical adenopathy.     Upper Body:     Right upper body: No supraclavicular or axillary adenopathy.     Left upper body: No supraclavicular or axillary adenopathy.  Neurological:     General: No focal deficit present.     Mental Status: He is alert and oriented to person, place, and time.  Psychiatric:        Mood and Affect: Mood normal.        Behavior: Behavior normal.     LABS:      Latest Ref Rng & Units 10/29/2023   11:10 AM 09/19/2023    8:04 PM 08/21/2023   10:09 AM  CBC  WBC 4.0 - 10.5 K/uL 6.1  8.0  4.9   Hemoglobin 13.0 - 17.0 g/dL 16.1  09.6  04.5   Hematocrit 39.0 - 52.0 % 35.2  35.4  35.6   Platelets 150 - 400 K/uL 252  235  224       Latest Ref Rng & Units 10/29/2023   11:10 AM 09/19/2023    8:04 PM 08/29/2023   12:00 AM  CMP  Glucose 70 - 99 mg/dL 409  811    BUN 8 - 23 mg/dL 25  29  31       Creatinine 0.61 - 1.24 mg/dL 9.14  7.82  1.9      Sodium 135 - 145 mmol/L 138  141  143      Potassium 3.5 - 5.1 mmol/L 4.4  4.3  4.5      Chloride 98 - 111 mmol/L 105  106  109      CO2 22 - 32 mmol/L 26  27  18       Calcium 8.9 - 10.3 mg/dL 8.9  8.5  9.0      Total Protein 6.5 - 8.1 g/dL 6.7  6.7    Total Bilirubin 0.0 - 1.2 mg/dL 0.6  0.6    Alkaline Phos 38 - 126 U/L 93  90  124      AST 15 - 41 U/L 36  35  33      ALT 0 - 44 U/L 39  34  31         This result is from an external source.     No results found for: "CEA1", "CEA" / No results found for: "CEA1", "CEA" Lab Results  Component Value Date   PSA1 1.8 12/28/2022   No results found for: "NFA213" No results found for: "CAN125"  No results found for: "TOTALPROTELP", "ALBUMINELP", "A1GS", "A2GS", "BETS", "BETA2SER", "GAMS", "MSPIKE", "SPEI" Lab Results  Component Value Date   TIBC 221 (L) 08/21/2023   TIBC 218 (L) 05/15/2023   TIBC 256 02/22/2023    FERRITIN 402 (H) 08/21/2023   FERRITIN 475 (H) 05/15/2023   FERRITIN 63 02/22/2023   IRONPCTSAT 36 08/21/2023   IRONPCTSAT 37 05/15/2023   IRONPCTSAT 27 02/22/2023   No results found for: "LDH"  STUDIES:   No results found.

## 2023-10-29 NOTE — Telephone Encounter (Signed)
Patient called me back to inform me account was created with [facility]. Script has been sent to [facility] with patient's email of [email or web address] attached to pharmacy notes for processing and fulfillment. Patient knows to expect a call from [facility] to schedule first shipment of medication. Patient also knows to call me at [phone] with any questions or concerns regarding receiving medication from Specialty Pharmacy.    Ardeen Fillers, CPhT Oncology Pharmacy Patient Advocate  [facility]  [phone] (phone) [phone] (fax) 10/29/2023 3:40 PM

## 2023-11-08 ENCOUNTER — Other Ambulatory Visit: Payer: Self-pay

## 2023-11-08 ENCOUNTER — Other Ambulatory Visit (HOSPITAL_COMMUNITY): Payer: Self-pay

## 2023-11-08 ENCOUNTER — Encounter: Payer: Self-pay | Admitting: Hematology

## 2023-11-08 ENCOUNTER — Other Ambulatory Visit: Payer: Self-pay | Admitting: Pharmacist

## 2023-11-08 ENCOUNTER — Telehealth: Payer: Self-pay

## 2023-11-08 DIAGNOSIS — C61 Malignant neoplasm of prostate: Secondary | ICD-10-CM

## 2023-11-08 MED ORDER — ABIRATERONE ACETATE 250 MG PO TABS
1000.0000 mg | ORAL_TABLET | Freq: Every day | ORAL | 0 refills | Status: DC
  Filled 2023-11-08: qty 120, 30d supply, fill #0

## 2023-11-08 NOTE — Telephone Encounter (Signed)
 Patient successfully OnBoarded and drug education provided by pharmacist. Medication scheduled to be shipped on Monday, 11/12/23, for delivery on Tuesday, 11/13/23, from [facility] to patient's address. Patient also knows to call me at [phone] with any questions or concerns regarding receiving medication or if there is any unexpected change in co-pay.    Morene Potters, CPhT Oncology Pharmacy Patient Advocate  [facility]  [phone] (phone) [phone] (fax) 11/08/2023 1:48 PM

## 2023-11-08 NOTE — Progress Notes (Signed)
 Specialty Pharmacy Initial Fill Coordination Note  [name] is a 73 y.o. male contacted today regarding initial fill of specialty medication(s) Abiraterone  Acetate (ZYTIGA )  Patient requested Delivery   Delivery date: 11/13/23   Verified address: [address] [place], [place] [address]  Medication will be filled on 11/12/23.   Patient is aware of $0.00 copayment. Bill [employer]    Morene Potters, CPhT Oncology Pharmacy Patient Advocate  [facility]  [phone] (phone) [phone] (fax) 11/08/2023 1:44 PM

## 2023-11-08 NOTE — Progress Notes (Signed)
 Patient education documented in EPIC note on 10/29/23.

## 2023-11-08 NOTE — Telephone Encounter (Signed)
 Oral Oncology Patient Advocate Encounter   Was successful in securing patient a grant from [employer] to provide copayment coverage for Abiraterone .  This will keep the out of pocket expense at $0.    The billing information is as follows and has been shared with [facility].   RxBin: [id number] PCN:  [id number] Member ID: [id number] Group ID: [id number] Dates of Eligibility: 11/01/23 through 12/01/23   Morene Potters, CPhT Oncology Pharmacy Patient Advocate  [facility]  [phone] (phone) [phone] (fax) 11/08/2023 10:44 AM

## 2023-11-08 NOTE — Telephone Encounter (Signed)
 Patient called me and indicated that they had not yet requested delivery of medication from [facility] [employer]. I let the patient know that we were able to secure a grant for them. Since patient has not ordered medication yet, Abiraterone  script has been redirected to [facility] so patient's insurance can be billed and grant can cover co-pay. Patient knows to expect delivery from [facility] on Tuesday, 11/13/23. Patient also knows to call me at [phone] with any questions or concerns regarding receiving medication or if there is any unexpected change in co-pay.    Morene Potters, CPhT Oncology Pharmacy Patient Advocate  [facility]  [phone] (phone) [phone] (fax) 11/08/2023 1:08 PM

## 2023-11-12 ENCOUNTER — Other Ambulatory Visit: Payer: Self-pay

## 2023-11-21 ENCOUNTER — Inpatient Hospital Stay: Payer: Medicare Other | Attending: Hematology

## 2023-11-21 DIAGNOSIS — D631 Anemia in chronic kidney disease: Secondary | ICD-10-CM | POA: Insufficient documentation

## 2023-11-21 DIAGNOSIS — Z8546 Personal history of malignant neoplasm of prostate: Secondary | ICD-10-CM | POA: Diagnosis not present

## 2023-11-21 DIAGNOSIS — C61 Malignant neoplasm of prostate: Secondary | ICD-10-CM

## 2023-11-21 DIAGNOSIS — N189 Chronic kidney disease, unspecified: Secondary | ICD-10-CM | POA: Diagnosis not present

## 2023-11-21 DIAGNOSIS — D509 Iron deficiency anemia, unspecified: Secondary | ICD-10-CM

## 2023-11-21 LAB — COMPREHENSIVE METABOLIC PANEL
ALT: 20 U/L (ref 0–44)
AST: 24 U/L (ref 15–41)
Albumin: 2.9 g/dL — ABNORMAL LOW (ref 3.5–5.0)
Alkaline Phosphatase: 120 U/L (ref 38–126)
Anion gap: 10 (ref 5–15)
BUN: 27 mg/dL — ABNORMAL HIGH (ref 8–23)
CO2: 25 mmol/L (ref 22–32)
Calcium: 8.7 mg/dL — ABNORMAL LOW (ref 8.9–10.3)
Chloride: 105 mmol/L (ref 98–111)
Creatinine, Ser: 1.64 mg/dL — ABNORMAL HIGH (ref 0.61–1.24)
GFR, Estimated: 44 mL/min — ABNORMAL LOW (ref >60)
Glucose, Bld: 172 mg/dL — ABNORMAL HIGH (ref 70–99)
Potassium: 4 mmol/L (ref 3.5–5.1)
Sodium: 140 mmol/L (ref 135–145)
Total Bilirubin: 0.8 mg/dL (ref 0.0–1.2)
Total Protein: 6.7 g/dL (ref 6.5–8.1)

## 2023-11-21 LAB — CBC WITH DIFFERENTIAL/PLATELET
Abs Immature Granulocytes: 0.07 10*3/uL (ref 0.00–0.07)
Basophils Absolute: 0 10*3/uL (ref 0.0–0.1)
Basophils Relative: 0 %
Eosinophils Absolute: 0.1 10*3/uL (ref 0.0–0.5)
Eosinophils Relative: 1 %
HCT: 34.3 % — ABNORMAL LOW (ref 39.0–52.0)
Hemoglobin: 11.4 g/dL — ABNORMAL LOW (ref 13.0–17.0)
Immature Granulocytes: 1 %
Lymphocytes Relative: 17 %
Lymphs Abs: 1.2 10*3/uL (ref 0.7–4.0)
MCH: 31.5 pg (ref 26.0–34.0)
MCHC: 33.2 g/dL (ref 30.0–36.0)
MCV: 94.8 fL (ref 80.0–100.0)
Monocytes Absolute: 0.4 10*3/uL (ref 0.1–1.0)
Monocytes Relative: 6 %
Neutro Abs: 5 10*3/uL (ref 1.7–7.7)
Neutrophils Relative %: 75 %
Platelets: 249 10*3/uL (ref 150–400)
RBC: 3.62 MIL/uL — ABNORMAL LOW (ref 4.22–5.81)
RDW: 13.9 % (ref 11.5–15.5)
WBC: 6.7 10*3/uL (ref 4.0–10.5)
nRBC: 0 % (ref 0.0–0.2)

## 2023-11-21 LAB — IRON AND TIBC
Iron: 87 ug/dL (ref 45–182)
Saturation Ratios: 37 % (ref 17.9–39.5)
TIBC: 234 ug/dL — ABNORMAL LOW (ref 250–450)
UIBC: 147 ug/dL

## 2023-11-21 LAB — PSA: Prostatic Specific Antigen: 0.03 ng/mL (ref 0.00–4.00)

## 2023-11-21 LAB — FERRITIN: Ferritin: 329 ng/mL (ref 24–336)

## 2023-11-23 ENCOUNTER — Ambulatory Visit (INDEPENDENT_AMBULATORY_CARE_PROVIDER_SITE_OTHER): Payer: Medicare Other | Admitting: Neurology

## 2023-11-23 ENCOUNTER — Encounter: Payer: Self-pay | Admitting: Neurology

## 2023-11-23 VITALS — BP 145/77 | HR 77 | Ht 71.0 in | Wt 250.0 lb

## 2023-11-23 DIAGNOSIS — R569 Unspecified convulsions: Secondary | ICD-10-CM

## 2023-11-23 MED ORDER — LEVETIRACETAM 250 MG PO TABS: 250.0000 mg | ORAL_TABLET | Freq: Two times a day (BID) | ORAL | 6 refills | Status: DC
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
## 2023-11-23 NOTE — Patient Instructions (Signed)
Start Keppra 250 mg nightly for 1 week then increase to 250 mg twice daily Continue your other medications Please contact me if you have any side effect from the Keppra Routine EEG Follow-up in 6 months or sooner if worse.

## 2023-11-27 ENCOUNTER — Ambulatory Visit (INDEPENDENT_AMBULATORY_CARE_PROVIDER_SITE_OTHER): Payer: Medicare Other | Admitting: *Deleted

## 2023-11-27 DIAGNOSIS — R569 Unspecified convulsions: Secondary | ICD-10-CM | POA: Diagnosis not present

## 2023-11-27 NOTE — Procedures (Signed)
    History:  73 year old man with seizure  EEG classification: Awake and drowsy  Duration: 25 minutes   Technical aspects: This EEG study was done with scalp electrodes positioned according to the 10-20 International system of electrode placement. Electrical activity was reviewed with band pass filter of 1-70Hz , sensitivity of 7 uV/mm, display speed of 36mm/sec with a 60Hz  notched filter applied as appropriate. EEG data were recorded continuously and digitally stored.   Description of the recording: The background rhythms of this recording consists of a fairly well modulated medium amplitude alpha rhythm of 9 Hz that is reactive to eye opening and closure. Present in the anterior head region is a 15-20 Hz beta activity. Photic stimulation was performed, did not show any abnormalities. Hyperventilation was not performed. Drowsiness was manifested by background fragmentation. No abnormal epileptiform discharges seen during this recording. There was no focal slowing. There were no electrographic seizure identified.   Abnormality: None   Impression: This is a normal awake and drowsy EEG. No evidence of interictal epileptiform discharges. Normal EEGs, however, do not rule out epilepsy.    Windell Norfolk, MD [facility]

## 2023-11-27 NOTE — Progress Notes (Signed)
[facility] [address], [place] [address]    Clinic Day:  11/28/23   Referring physician: Benita Stabile, MD  Patient Care Team: Benita Stabile, MD as PCP - General (Internal Medicine) Erroll Luna, [facility] (Inactive) as Pharmacist (Pharmacist) Lanelle Bal, DO as Consulting Physician (Internal Medicine) Lanelle Bal, DO as Consulting Physician (Gastroenterology) Doreatha Massed, MD as Medical Oncologist (Medical Oncology) Therese Sarah, RN as Oncology Nurse Navigator (Medical Oncology)   ASSESSMENT & PLAN:   Assessment: 1.  Castration refractory prostate cancer, T3 N1 M0: - 04/27/2017: Prostatic adenocarcinoma Gleason 4+5=9 involving both lobes, PSA 9.8 - Status post long-term ADT plus definitive EBRT from 08/27/2017 through 10/26/2017 - History of radiation-induced cystitis and proctitis - Patient on Eligard every 6 months since 03/19/2020 - PSA <0.1 (09/01/2021), 0.2 (03/09/2022), 0.6 (07/05/2022), 0.9 (09/19/2022), 1.8 (2/29/2022) - PSMA PET (12/14/2022): Marked uptake in the prostate gland compatible with residual/recurrent prostate cancer.  No evidence of nodal or distant metastatic disease.  No focal bone mets. - Darolutamide 600 mg twice daily started on 01/16/2023.  Discontinued on 10/11/2023 as he developed seizures. - Invitae germline: MSH 3 heterozygous VUS.  No pathogenic variants detected. - CARIS ASSURE (03/21/2023): No pathogenic tumor derived somatic variants detected.  TMB indeterminate.  MSI indeterminate.  Most likely low tumor circulating cells. - Abiraterone and prednisone started on 11/13/2023   2.  Social/family history: - Lives at home with his wife.  Independent of ADLs and IADLs.  He had right BKA and left AKA from diabetes and has prosthesis.  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  He is a non-smoker. - Father had lung cancer.  Maternal grand father had prostate cancer.  Sister had breast  cancer.    Plan: 1.  M0 Castrate resistant prostate cancer: - As he had seizures on Xarelto mild, we have switched him to abiraterone. - He started taking abiraterone 1000 mg daily with prednisone 5 mg daily on 11/13/2023. - He does not report any GI side effects.  However he reported more tiredness since he started on Zytiga.  He is still able to do his normal activities. - Reviewed labs today: Creatinine 1.64 and potassium 4.0.  LFTs are normal.  [name] is low at 2.9.  CBC was normal.  PSA 0.03. - Recommend continuing abiraterone 1000 mg daily and prednisone 5 mg daily.  RTC 3 weeks for follow-up with repeat labs.   2.  Bone health (DEXA 06/22/2022 T-score -0.4): - Continue calcium and vitamin D supplements.  Will plan on repeating DEXA scan in August 2025.   3.  Normocytic anemia: - Combination anemia from CKD and functional iron deficiency.  Last Monoferric on 04/02/2023. - Ferritin is 329, percent saturation 37 and hemoglobin is stable at 11.4.  No indication for parenteral iron therapy.  4.  Hypertension: - Blood pressure today is 150/70.  He is on atenolol 25 mg daily.  He also takes Lasix 20 mg on Monday, Wednesday and Friday. - I have recommended that he check his blood pressure at home and bring the readings with him next time.    No orders of the defined types were placed in this encounter.   [name],acting as a Neurosurgeon for Doreatha Massed, MD.,have documented all relevant documentation on the behalf of Doreatha Massed, MD,as directed by  Doreatha Massed, MD while in the presence of Doreatha Massed, MD.  I, Doreatha Massed MD,  have reviewed the above documentation for accuracy and completeness, and I agree with the above.    Doreatha Massed, MD   1/29/20252:36 PM  CHIEF COMPLAINT:   Diagnosis: prostate cancer    Cancer Staging  Prostate cancer [facility]) Staging form: Prostate, AJCC 8th Edition - Clinical: Stage IVA (cT1c, cN1, cM0, PSA:  9.8, Grade Group: 5) - Signed by Margaretmary Dys, MD on 06/09/2017    Prior Therapy: radiation therapy 10/29-12/28/2018   Current Therapy:  Leurpolide injections and abiraterone plus prednisone   HISTORY OF PRESENT ILLNESS:   Oncology History   No history exists.     INTERVAL HISTORY:   [name] is a 73 y.o. male seen for follow-up of prostate cancer.  He was last seen by me on 10/29/23.  Today, he states that he is doing well overall. His appetite level is at 100%. His energy level is at 35%.   PAST MEDICAL HISTORY:   Past Medical History: Past Medical History:  Diagnosis Date   Arthritis    Cerebrovascular disease    MRI shows Right carotid inferior cavernours narrowing 75% and  50-75% stenosis of Cavernous and supraclinoi right side   CKD (chronic kidney disease) 06/28/2014   Sees Dr Lowell Guitar   Diabetic retinopathy [facility])    Hyperlipidemia    Hypertension    Myocardial infarction [facility])    "mild" heart attack   Necrosis (HCC)    #2 nail    Pneumonia    as a child   Poor circulation of extremity    Prostate cancer (HCC) 2017   Prostate   Sleep apnea    uses cpap, getting a new one   Type 2 Diabetes mellitus    Type 2    Surgical History: Past Surgical History:  Procedure Laterality Date   ABOVE KNEE LEG AMPUTATION Left 2004   started out below knee and then extended to above knee due to poor healing   AMPUTATION Right 04/28/2015   Procedure: AMPUTATION RAY, RIGHT 5TH TOE;  Surgeon: Eldred Manges, MD;  Location: [facility] OR;  Service: Orthopedics;  Laterality: Right;   AMPUTATION Right 05/10/2015   Procedure: Right Below Knee Amputation;  Surgeon: Eldred Manges, MD;  Location: [facility] OR;  Service: Orthopedics;  Laterality: Right;   CATARACT EXTRACTION W/PHACO  09/05/2012   Procedure: CATARACT EXTRACTION PHACO AND INTRAOCULAR LENS PLACEMENT (IOC);  Surgeon: Gemma Payor, MD;  Location: [facility] ORS;  Service: Ophthalmology;  Laterality: Left;  CDE=5.45   CATARACT EXTRACTION W/PHACO   10/03/2012   Procedure: CATARACT EXTRACTION PHACO AND INTRAOCULAR LENS PLACEMENT (IOC);  Surgeon: Gemma Payor, MD;  Location: [facility] ORS;  Service: Ophthalmology;  Laterality: Right;  CDE: 12.31   COLONOSCOPY  2011   Dr. Arlyce Dice: colon polyps, tubular adenoma   COLONOSCOPY N/A 11/01/2018   Dr. Jena Gauss: Blood noted in the rectal vault.  Vascular pattern of the rectum abnormal, neovascular changes distally and actively oozing.  There were 3 2 to 5 mm polyps in the splenic flexure, cecum removed.  Cecal polyp was not recovered.  Radiation proctitis status post APC treatment.  The splenic flexure polyps were tubular adenomas.  Next colonoscopy in 5 years.   FLEXIBLE SIGMOIDOSCOPY N/A 10/27/2019   Procedure: FLEXIBLE SIGMOIDOSCOPY;  Surgeon: West Bali, MD; rectal bleeding due to radiation proctitis s/p APC therapy (actively bleeding during exam), rectosigmoid colon and sigmoid colon appeared normal.   FLEXIBLE SIGMOIDOSCOPY N/A 03/13/2022   Procedure: FLEXIBLE SIGMOIDOSCOPY;  Surgeon: Lanelle Bal, DO;  Location: [facility]  ENDO SUITE;  Service: Endoscopy;  Laterality: N/A;  1:45pm   HOT HEMOSTASIS  03/13/2022   Procedure: HOT HEMOSTASIS (ARGON PLASMA COAGULATION/BICAP);  Surgeon: Lanelle Bal, DO;  Location: [facility] ENDO SUITE;  Service: Endoscopy;;   POLYPECTOMY  11/01/2018   Procedure: POLYPECTOMY;  Surgeon: Corbin Ade, MD;  Location: [facility] ENDO SUITE;  Service: Endoscopy;;  cecum,splenic flexure   REFRACTIVE SURGERY Left 08/01/2021   Cleaned cataract    Social History: Social History   Socioeconomic History   Marital status: Married    Spouse name: Not on file   Number of children: 2   Years of education: college   Highest education level: Not on file  Occupational History   Occupation: Engineer, materials: [employer]  Tobacco Use   Smoking status: Never    Passive exposure: Never   Smokeless tobacco: Never  Vaping Use   Vaping status: Never Used  Substance and Sexual  Activity   Alcohol use: No   Drug use: No   Sexual activity: Not Currently  Other Topics Concern   Not on file  Social History Narrative   Patient lives with his wife Patient right handedPatient drinks caffine on occ.   Pt retired    Teacher, early years/pre Strain: Low Risk  (07/05/2023)   Received [facility]   Overall Financial Resource Strain (CARDIA)    Difficulty of Paying Living Expenses: Not hard at all  Food Insecurity: No Food Insecurity (07/05/2023)   Received from [facility]   Hunger Vital Sign    Worried About Running Out of Food in the Last Year: Never true    Ran Out of Food in the Last Year: Never true  Transportation Needs: No Transportation Needs (07/05/2023)   Received from [facility] - Transportation    Lack of Transportation (Medical): No    Lack of Transportation (Non-Medical): No  Physical Activity: Sufficiently Active (07/05/2023)   Received from [facility]   Exercise Vital Sign    Days of Exercise per Week: 6 days    Minutes of Exercise per Session: 30 min  Stress: No Stress Concern Present (07/05/2023)   Received from [facility] - Occupational Stress Questionnaire    Feeling of Stress : Not at all  Social Connections: Socially Integrated (07/05/2023)   [facility]   Social Connection and Isolation Panel [NHANES]    Frequency of Communication with Friends and Family: More than three times a week    Frequency of Social Gatherings with Friends and Family: More than three times a week    Attends Religious Services: More than 4 times per year    Active Member of [employer] or Organizations: Yes    Attends Engineer, structural: More than 4 times per year    Marital Status: Married  Catering manager Violence: Not At Risk (07/05/2023)   Received from [facility]   Humiliation, Afraid, Rape, and Kick questionnaire    Fear of Current or  Ex-Partner: No    Emotionally Abused: No    Physically Abused: No    Sexually Abused: No    Family History: Family History  Problem Relation Age of Onset   Diabetes Mother    Hypertension Mother    Stroke Mother    Lung cancer Father 81   Breast cancer Sister 24  Stroke Sister    Stroke Sister    Prostate cancer Maternal Grandfather    Stroke Maternal Grandfather    Sickle cell anemia Daughter    Prostate cancer Cousin    Prostate cancer Cousin    Colon cancer Neg Hx    Seizures Neg Hx     Current Medications:  Current Outpatient Medications:    abiraterone acetate (ZYTIGA) 250 MG tablet, Take 4 tablets (1,000 mg total) by mouth daily. Take on an empty stomach 1 hour before or 2 hours after a meal, Disp: 120 tablet, Rfl: 0   acetaminophen (TYLENOL) 325 MG tablet, Take 650 mg by mouth daily as needed for mild pain, moderate pain or headache., Disp: , Rfl:    atenolol (TENORMIN) 25 MG tablet, TAKE 1 TABLET BY MOUTH EVERY DAY, Disp: 90 tablet, Rfl: 1   atorvastatin (LIPITOR) 80 MG tablet, TAKE 1 TABLET BY MOUTH EVERY DAY, Disp: 90 tablet, Rfl: 1   B-D UF III MINI PEN NEEDLES 31G X 5 MM MISC, USE FOUR TIMES DAILY AS DIRECTED, Disp: 150 each, Rfl: 2   Calcium Carb-Cholecalciferol 600-500 MG-UNIT CAPS, Take 2 capsules by mouth daily. , Disp: , Rfl:    clopidogrel (PLAVIX) 75 MG tablet, TAKE 1 TABLET BY MOUTH EVERY DAY, Disp: 90 tablet, Rfl: 1   CONTOUR TEST test strip, TEST TWICE DAILY E11.65, Disp: 200 each, Rfl: 2   furosemide (LASIX) 20 MG tablet, Take 20 mg by mouth 3 (three) times a week., Disp: , Rfl:    insulin degludec (TRESIBA FLEXTOUCH) 100 UNIT/ML SOPN FlexTouch Pen, Inject 20 Units into the skin at bedtime., Disp: , Rfl:    latanoprost (XALATAN) 0.005 % ophthalmic solution, Place 1 drop into both eyes at bedtime., Disp: , Rfl:    levETIRAcetam (KEPPRA) 250 MG tablet, Take 1 tablet (250 mg total) by mouth 2 (two) times daily., Disp: 60 tablet, Rfl: 6   predniSONE  (DELTASONE) 5 MG tablet, Take 1 tablet (5 mg total) by mouth daily with breakfast., Disp: 30 tablet, Rfl: 5   prochlorperazine (COMPAZINE) 10 MG tablet, Take 1 tablet (10 mg total) by mouth every 6 (six) hours as needed for nausea or vomiting., Disp: 30 tablet, Rfl: 1   Semaglutide, 1 MG/DOSE, 4 MG/3ML SOPN, Inject 1 mg as directed once a week., Disp: 6 mL, Rfl: 3   Vibegron (GEMTESA) 75 MG TABS, Take 1 tablet (75 mg total) by mouth daily., Disp: 30 tablet, Rfl: 11   Allergies: Allergies  Allergen Reactions   Zolpidem Tartrate Other (See Comments)    disorientation     REVIEW OF SYSTEMS:   Review of Systems  Constitutional:  Positive for fatigue. Negative for chills and fever.  HENT:   Negative for lump/mass, mouth sores, nosebleeds, sore throat and trouble swallowing.   Eyes:  Negative for eye problems.  Respiratory:  Positive for cough and shortness of breath.   Cardiovascular:  Negative for chest pain, leg swelling and palpitations.  Gastrointestinal:  Negative for abdominal pain, constipation, diarrhea, nausea and vomiting.  Genitourinary:  Negative for bladder incontinence, difficulty urinating, dysuria, frequency, hematuria and nocturia.   Musculoskeletal:  Negative for arthralgias, back pain, flank pain, myalgias and neck pain.  Skin:  Negative for itching and rash.  Neurological:  Negative for dizziness, headaches and numbness.  Hematological:  Does not bruise/bleed easily.  Psychiatric/Behavioral:  Negative for depression, sleep disturbance and suicidal ideas. The patient is not nervous/anxious.   All other systems reviewed and are negative.  VITALS:   Blood pressure (!) 150/70, pulse 82, temperature 98.2 F (36.8 C), temperature source Oral, resp. rate 18, SpO2 95%.  Wt Readings from Last 3 Encounters:  11/23/23 250 lb (113.4 kg)  10/29/23 248 lb 6.4 oz (112.7 kg)  10/18/23 248 lb (112.5 kg)    There is no height or weight on file to calculate BMI.  Performance  status (ECOG): 1 - Symptomatic but completely ambulatory  PHYSICAL EXAM:   Physical Exam Vitals and nursing note reviewed. Exam conducted with a chaperone present.  Constitutional:      Appearance: Normal appearance.  Cardiovascular:     Rate and Rhythm: Normal rate and regular rhythm.     Pulses: Normal pulses.     Heart sounds: Normal heart sounds.  Pulmonary:     Effort: Pulmonary effort is normal.     Breath sounds: Normal breath sounds.  Abdominal:     Palpations: Abdomen is soft. There is no hepatomegaly, splenomegaly or mass.     Tenderness: There is no abdominal tenderness.  Musculoskeletal:     Right lower leg: No edema.     Left lower leg: No edema.  Lymphadenopathy:     Cervical: No cervical adenopathy.     Right cervical: No superficial, deep or posterior cervical adenopathy.    Left cervical: No superficial, deep or posterior cervical adenopathy.     Upper Body:     Right upper body: No supraclavicular or axillary adenopathy.     Left upper body: No supraclavicular or axillary adenopathy.  Neurological:     General: No focal deficit present.     Mental Status: He is alert and oriented to person, place, and time.  Psychiatric:        Mood and Affect: Mood normal.        Behavior: Behavior normal.    LABS:      Latest Ref Rng & Units 11/21/2023    1:32 PM 10/29/2023   11:10 AM 09/19/2023    8:04 PM  CBC  WBC 4.0 - 10.5 K/uL 6.7  6.1  8.0   Hemoglobin 13.0 - 17.0 g/dL 16.1  09.6  04.5   Hematocrit 39.0 - 52.0 % 34.3  35.2  35.4   Platelets 150 - 400 K/uL 249  252  235       Latest Ref Rng & Units 11/21/2023    1:32 PM 10/29/2023   11:10 AM 09/19/2023    8:04 PM  CMP  Glucose 70 - 99 mg/dL 409  811  914   BUN 8 - 23 mg/dL 27  25  29    Creatinine 0.61 - 1.24 mg/dL 7.82  9.56  2.13   Sodium 135 - 145 mmol/L 140  138  141   Potassium 3.5 - 5.1 mmol/L 4.0  4.4  4.3   Chloride 98 - 111 mmol/L 105  105  106   CO2 22 - 32 mmol/L 25  26  27    Calcium 8.9 -  10.3 mg/dL 8.7  8.9  8.5   Total Protein 6.5 - 8.1 g/dL 6.7  6.7  6.7   Total Bilirubin 0.0 - 1.2 mg/dL 0.8  0.6  0.6   Alkaline Phos 38 - 126 U/L 120  93  90   AST 15 - 41 U/L 24  36  35   ALT 0 - 44 U/L 20  39  34      No results found for: "CEA1", "CEA" / No results found for: "  CEA1", "CEA" Lab Results  Component Value Date   PSA1 1.8 12/28/2022   No results found for: "JXB147" No results found for: "CAN125"  No results found for: "TOTALPROTELP", "ALBUMINELP", "A1GS", "A2GS", "BETS", "BETA2SER", "GAMS", "MSPIKE", "SPEI" Lab Results  Component Value Date   TIBC 234 (L) 11/21/2023   TIBC 221 (L) 08/21/2023   TIBC 218 (L) 05/15/2023   FERRITIN 329 11/21/2023   FERRITIN 402 (H) 08/21/2023   FERRITIN 475 (H) 05/15/2023   IRONPCTSAT 37 11/21/2023   IRONPCTSAT 36 08/21/2023   IRONPCTSAT 37 05/15/2023   No results found for: "LDH"   STUDIES:   EEG adult Result Date: 11/27/2023 Windell Norfolk, MD     11/27/2023  1:08 PM History: 73 year old man with seizure EEG classification: Awake and drowsy Duration: 25 minutes Technical aspects: This EEG study was done with scalp electrodes positioned according to the 10-20 International system of electrode placement. Electrical activity was reviewed with band pass filter of 1-70Hz , sensitivity of 7 uV/mm, display speed of 53mm/sec with a 60Hz  notched filter applied as appropriate. EEG data were recorded continuously and digitally stored. Description of the recording: The background rhythms of this recording consists of a fairly well modulated medium amplitude alpha rhythm of 9 Hz that is reactive to eye opening and closure. Present in the anterior head region is a 15-20 Hz beta activity. Photic stimulation was performed, did not show any abnormalities. Hyperventilation was not performed. Drowsiness was manifested by background fragmentation. No abnormal epileptiform discharges seen during this recording. There was no focal slowing. There were no  electrographic seizure identified. Abnormality: None Impression: This is a normal awake and drowsy EEG. No evidence of interictal epileptiform discharges. Normal EEGs, however, do not rule out epilepsy. Windell Norfolk, MD [facility]

## 2023-11-28 ENCOUNTER — Inpatient Hospital Stay (HOSPITAL_BASED_OUTPATIENT_CLINIC_OR_DEPARTMENT_OTHER): Payer: Medicare Other | Admitting: Hematology

## 2023-11-28 ENCOUNTER — Encounter: Payer: Self-pay | Admitting: Hematology

## 2023-11-28 VITALS — BP 150/70 | HR 82 | Temp 98.2°F | Resp 18

## 2023-11-28 DIAGNOSIS — Z8546 Personal history of malignant neoplasm of prostate: Secondary | ICD-10-CM | POA: Diagnosis not present

## 2023-11-28 DIAGNOSIS — D631 Anemia in chronic kidney disease: Secondary | ICD-10-CM | POA: Diagnosis not present

## 2023-11-28 DIAGNOSIS — N189 Chronic kidney disease, unspecified: Secondary | ICD-10-CM | POA: Diagnosis not present

## 2023-11-28 DIAGNOSIS — C61 Malignant neoplasm of prostate: Secondary | ICD-10-CM

## 2023-11-28 NOTE — Progress Notes (Signed)
Patient is taking Zytiga as prescribed. He has not missed any doses and reports no side effects at this time.

## 2023-11-28 NOTE — Patient Instructions (Signed)
[facility] at [facility] Discharge Instructions   You were seen and examined today by Dr. Ellin Saba.  He reviewed the results of your lab work which are normal/stable.   Continue Zytiga and prednisone as prescribed. Be sure to keep a check on your blood pressure at home. If the top number is greater than 160, or the bottom number is higher than 90 call and let us know.   We will proceed with your treatment today.   Return as scheduled.    Thank you for choosing [facility] at [facility] to provide your oncology and hematology care.  To afford each patient quality time with our provider, please arrive at least 15 minutes before your scheduled appointment time.   If you have a lab appointment with the Cancer Center please come in thru the Main Entrance and check in at the main information desk.  You need to re-schedule your appointment should you arrive 10 or more minutes late.  We strive to give you quality time with our providers, and arriving late affects you and other patients whose appointments are after yours.  Also, if you no show three or more times for appointments you may be dismissed from the clinic at the providers discretion.     Again, thank you for choosing [facility].  Our hope is that these requests will decrease the amount of time that you wait before being seen by our physicians.       _____________________________________________________________  Should you have questions after your visit to [facility], please contact our office at [phone] and follow the prompts.  Our office hours are 8:00 a.m. and 4:30 p.m. Monday - Friday.  Please note that voicemails left after 4:00 p.m. may not be returned until the following business day.  We are closed weekends and major holidays.  You do have access to a nurse 24-7, just call the main number to the clinic [phone] and do not press any options, hold  on the line and a nurse will answer the phone.    For prescription refill requests, have your pharmacy contact our office and allow 72 hours.    Due to Covid, you will need to wear a mask upon entering the hospital. If you do not have a mask, a mask will be given to you at the Main Entrance upon arrival. For doctor visits, patients may have 1 support person age 102 or older with them. For treatment visits, patients can not have anyone with them due to social distancing guidelines and our immunocompromised population.

## 2023-11-29 ENCOUNTER — Other Ambulatory Visit: Payer: Self-pay | Admitting: Hematology

## 2023-11-29 ENCOUNTER — Other Ambulatory Visit: Payer: Self-pay

## 2023-11-29 ENCOUNTER — Encounter: Payer: Self-pay | Admitting: Neurology

## 2023-11-29 DIAGNOSIS — C61 Malignant neoplasm of prostate: Secondary | ICD-10-CM

## 2023-11-29 MED ORDER — ABIRATERONE ACETATE 250 MG PO TABS
1000.0000 mg | ORAL_TABLET | Freq: Every day | ORAL | 2 refills | Status: DC
  Filled 2023-11-30 – 2023-12-12 (×2): qty 120, 30d supply, fill #0
  Filled 2024-01-07 (×2): qty 120, 30d supply, fill #1
  Filled 2024-02-11: qty 120, 30d supply, fill #2

## 2023-11-29 NOTE — Progress Notes (Signed)
Specialty Pharmacy Refill Coordination Note  [name] is a 73 y.o. male contacted today regarding refills of specialty medication(s) Abiraterone Acetate [name])   Patient requested Delivery   Delivery date: 12/05/23   Verified address: [address], [place], [place] [address]   Medication will be filled on 12/04/23.

## 2023-11-29 NOTE — Progress Notes (Signed)
Specialty Pharmacy Ongoing Clinical Assessment Note  [name] is a 73 y.o. male who is being followed by the specialty pharmacy service for RxSp Oncology   Patient's specialty medication(s) reviewed today: Abiraterone Acetate (ZYTIGA)   Missed doses in the last 4 weeks: 0   Patient/Caregiver did not have any additional questions or concerns.   Therapeutic benefit summary: Patient is achieving benefit (11/21/23 PSA 0.03)   Adverse events/side effects summary: No adverse events/side effects   Patient's therapy is appropriate to: Continue    Goals Addressed             This Visit's Progress    Stabilization of disease       Patient is on track. Patient will maintain adherence         Follow up:  3 months  Bobette Mo Specialty Pharmacist

## 2023-11-30 ENCOUNTER — Other Ambulatory Visit: Payer: Self-pay

## 2023-12-03 DIAGNOSIS — Z89511 Acquired absence of right leg below knee: Secondary | ICD-10-CM | POA: Diagnosis not present

## 2023-12-03 DIAGNOSIS — E1122 Type 2 diabetes mellitus with diabetic chronic kidney disease: Secondary | ICD-10-CM | POA: Diagnosis not present

## 2023-12-03 DIAGNOSIS — I129 Hypertensive chronic kidney disease with stage 1 through stage 4 chronic kidney disease, or unspecified chronic kidney disease: Secondary | ICD-10-CM | POA: Diagnosis not present

## 2023-12-03 DIAGNOSIS — R569 Unspecified convulsions: Secondary | ICD-10-CM | POA: Diagnosis not present

## 2023-12-03 DIAGNOSIS — E559 Vitamin D deficiency, unspecified: Secondary | ICD-10-CM | POA: Diagnosis not present

## 2023-12-03 DIAGNOSIS — N1831 Chronic kidney disease, stage 3a: Secondary | ICD-10-CM | POA: Diagnosis not present

## 2023-12-03 DIAGNOSIS — C61 Malignant neoplasm of prostate: Secondary | ICD-10-CM | POA: Diagnosis not present

## 2023-12-04 ENCOUNTER — Other Ambulatory Visit: Payer: Self-pay

## 2023-12-04 ENCOUNTER — Encounter: Payer: Self-pay | Admitting: Hematology

## 2023-12-04 MED ORDER — TRESIBA FLEXTOUCH 100 UNIT/ML ~~LOC~~ SOPN: 20.0000 [IU] | PEN_INJECTOR | Freq: Every day | SUBCUTANEOUS | 0 refills | Status: DC

## 2023-12-05 ENCOUNTER — Other Ambulatory Visit: Payer: Self-pay

## 2023-12-06 ENCOUNTER — Telehealth: Payer: Self-pay | Admitting: Neurology

## 2023-12-06 ENCOUNTER — Other Ambulatory Visit: Payer: Self-pay

## 2023-12-06 DIAGNOSIS — N3941 Urge incontinence: Secondary | ICD-10-CM

## 2023-12-06 MED ORDER — GEMTESA 75 MG PO TABS: 75.0000 mg | ORAL_TABLET | Freq: Every day | ORAL | Status: AC

## 2023-12-06 NOTE — Telephone Encounter (Signed)
 I called [facility] and stated that we only see them for sz and not sure why they thought we rx for cpap. [facility] stated that they contacted the wrong provider and plan to reach out to the correct one

## 2023-12-06 NOTE — Telephone Encounter (Signed)
 [facility] called needing an Rx for the pt to be able to get his cpap supplies. Please advise.

## 2023-12-06 NOTE — Progress Notes (Signed)
 Referring Provider: Shona Norleen PEDLAR, MD Primary Care Physician:  Shona Norleen PEDLAR, MD Primary GI Physician: Dr. Cindie  Chief Complaint  Patient presents with   Follow-up    Follow up. Blood in stool sometimes     HPI:   [name] is a 73 y.o. male presenting today for follow-up.  He has history of radiation proctitis, rectal bleeding, adenomatous colon polyps, and rectal AVMs.  Also with chronic anemia likely secondary to chronic disease.  Last colonoscopy 11/01/2018: 3 polyps resected, 2 retrieved.  Radiation proctitis with active bleeding s/p APC. Pathology with tubular adenomas. Surveillance in 5 years.   Flexible sigmoidoscopy 10/27/2019: Active bleeding secondary to radiation proctitis s/p APC.  Flexible sigmoidoscopy 03/13/2022: Multiple bleeding colonic AVMs s/p APC.  Most recent hemoglobin stable at 11.4 on 11/21/2023.  Iron panel also within normal limits.   Today:  Started having some intermittent rectal bleeding over the last 3 months. Total of 3 episodes. 1st time, it was a large amount in the commode. Next couple of times it was just on tissue. Last occurrence was in December.   No rectal pain, trouble with bowel habits, black stools, unintentional weight loss.   No upper GI concerns.  NSAIDs: None.   Past Medical History:  Diagnosis Date   Arthritis    Cerebrovascular disease    MRI shows Right carotid inferior cavernours narrowing 75% and  50-75% stenosis of Cavernous and supraclinoi right side   CKD (chronic kidney disease) 06/28/2014   Sees Dr Perri   Diabetic retinopathy [facility])    Hyperlipidemia    Hypertension    Myocardial infarction [facility])    mild heart attack   Necrosis (HCC)    #2 nail    Pneumonia    as a child   Poor circulation of extremity    Prostate cancer (HCC) 2017   Prostate   Sleep apnea    uses cpap, getting a new one   Type 2 Diabetes mellitus    Type 2    Past Surgical History:  Procedure Laterality Date   ABOVE KNEE LEG  AMPUTATION Left 2004   started out below knee and then extended to above knee due to poor healing   AMPUTATION Right 04/28/2015   Procedure: AMPUTATION RAY, RIGHT 5TH TOE;  Surgeon: Oneil JAYSON Herald, MD;  Location: [facility] OR;  Service: Orthopedics;  Laterality: Right;   AMPUTATION Right 05/10/2015   Procedure: Right Below Knee Amputation;  Surgeon: Oneil JAYSON Herald, MD;  Location: [facility] OR;  Service: Orthopedics;  Laterality: Right;   CATARACT EXTRACTION W/PHACO  09/05/2012   Procedure: CATARACT EXTRACTION PHACO AND INTRAOCULAR LENS PLACEMENT (IOC);  Surgeon: Cherene Mania, MD;  Location: [facility] ORS;  Service: Ophthalmology;  Laterality: Left;  CDE=5.45   CATARACT EXTRACTION W/PHACO  10/03/2012   Procedure: CATARACT EXTRACTION PHACO AND INTRAOCULAR LENS PLACEMENT (IOC);  Surgeon: Cherene Mania, MD;  Location: [facility] ORS;  Service: Ophthalmology;  Laterality: Right;  CDE: 12.31   COLONOSCOPY  2011   Dr. Debrah: colon polyps, tubular adenoma   COLONOSCOPY N/A 11/01/2018   Dr. Shaaron: Blood noted in the rectal vault.  Vascular pattern of the rectum abnormal, neovascular changes distally and actively oozing.  There were 3 2 to 5 mm polyps in the splenic flexure, cecum removed.  Cecal polyp was not recovered.  Radiation proctitis status post APC treatment.  The splenic flexure polyps were tubular adenomas.  Next colonoscopy in 5 years.   FLEXIBLE SIGMOIDOSCOPY N/A 10/27/2019  Procedure: FLEXIBLE SIGMOIDOSCOPY;  Surgeon: Harvey Margo CROME, MD; rectal bleeding due to radiation proctitis s/p APC therapy (actively bleeding during exam), rectosigmoid colon and sigmoid colon appeared normal.   FLEXIBLE SIGMOIDOSCOPY N/A 03/13/2022   Procedure: FLEXIBLE SIGMOIDOSCOPY;  Surgeon: Cindie Carlin POUR, DO;  Location: [facility];  Service: Endoscopy;  Laterality: N/A;  1:45pm   HOT HEMOSTASIS  03/13/2022   Procedure: HOT HEMOSTASIS (ARGON PLASMA COAGULATION/BICAP);  Surgeon: Cindie Carlin POUR, DO;  Location: [facility];  Service: Endoscopy;;    POLYPECTOMY  11/01/2018   Procedure: POLYPECTOMY;  Surgeon: Shaaron Lamar HERO, MD;  Location: [facility];  Service: Endoscopy;;  cecum,splenic flexure   REFRACTIVE SURGERY Left 08/01/2021   Cleaned cataract    Current Outpatient Medications  Medication Sig Dispense Refill   abiraterone  acetate (ZYTIGA ) 250 MG tablet Take 4 tablets (1,000 mg total) by mouth daily. Take on an empty stomach 1 hour before or 2 hours after a meal 120 tablet 2   acetaminophen  (TYLENOL ) 325 MG tablet Take 650 mg by mouth daily as needed for mild pain, moderate pain or headache.     atenolol  (TENORMIN ) 25 MG tablet TAKE 1 TABLET BY MOUTH EVERY DAY 90 tablet 1   atorvastatin  (LIPITOR) 80 MG tablet TAKE 1 TABLET BY MOUTH EVERY DAY 90 tablet 1   B-D UF III MINI PEN NEEDLES 31G X 5 MM MISC USE FOUR TIMES DAILY AS DIRECTED 150 each 2   Calcium  Carb-Cholecalciferol 600-500 MG-UNIT CAPS Take 2 capsules by mouth daily.      clopidogrel  (PLAVIX ) 75 MG tablet TAKE 1 TABLET BY MOUTH EVERY DAY 90 tablet 1   CONTOUR TEST test strip TEST TWICE DAILY E11.65 200 each 2   furosemide  (LASIX ) 20 MG tablet Take 20 mg by mouth 3 (three) times a week.     insulin  degludec (TRESIBA  FLEXTOUCH) 100 UNIT/ML FlexTouch Pen Inject 20 Units into the skin at bedtime. 6 mL 0   latanoprost (XALATAN) 0.005 % ophthalmic solution Place 1 drop into both eyes at bedtime.     levETIRAcetam  (KEPPRA ) 250 MG tablet Take 1 tablet (250 mg total) by mouth 2 (two) times daily. 60 tablet 6   predniSONE  (DELTASONE ) 5 MG tablet Take 1 tablet (5 mg total) by mouth daily with breakfast. 30 tablet 5   prochlorperazine  (COMPAZINE ) 10 MG tablet Take 1 tablet (10 mg total) by mouth every 6 (six) hours as needed for nausea or vomiting. 30 tablet 1   Semaglutide , 1 MG/DOSE, 4 MG/3ML SOPN Inject 1 mg as directed once a week. 6 mL 3   Vibegron  (GEMTESA ) 75 MG TABS Take 1 tablet (75 mg total) by mouth daily. 30 tablet 11   Vibegron  (GEMTESA ) 75 MG TABS Take 1 tablet (75  mg total) by mouth daily.     No current facility-administered medications for this visit.    Allergies as of 12/07/2023 - Review Complete 12/07/2023  Allergen Reaction Noted   Zolpidem tartrate Other (See Comments) 04/11/2011    Family History  Problem Relation Age of Onset   Diabetes Mother    Hypertension Mother    Stroke Mother    Lung cancer Father 80   Breast cancer Sister 66   Stroke Sister    Stroke Sister    Prostate cancer Maternal Grandfather    Stroke Maternal Grandfather    Sickle cell anemia Daughter    Prostate cancer Cousin    Prostate cancer Cousin    Colon cancer Neg Hx  Seizures Neg Hx     Social History   Socioeconomic History   Marital status: Married    Spouse name: Not on file   Number of children: 2   Years of education: college   Highest education level: Not on file  Occupational History   Occupation: Engineer, Materials: [employer]  Tobacco Use   Smoking status: Never    Passive exposure: Never   Smokeless tobacco: Never  Vaping Use   Vaping status: Never Used  Substance and Sexual Activity   Alcohol use: No   Drug use: No   Sexual activity: Not Currently  Other Topics Concern   Not on file  Social History Narrative   Patient lives with his wife Patient right handedPatient drinks caffine on occ.   Pt retired    Teacher, Early Years/pre Strain: Low Risk  (07/05/2023)   Received from [facility]   Overall Financial Resource Strain (CARDIA)    Difficulty of Paying Living Expenses: Not hard at all  Food Insecurity: No Food Insecurity (07/05/2023)   [facility]   Hunger Vital Sign    Worried About Running Out of Food in the Last Year: Never true    Ran Out of Food in the Last Year: Never true  Transportation Needs: No Transportation Needs (07/05/2023)   [facility] - Transportation    Lack of Transportation (Medical): No    Lack of Transportation  (Non-Medical): No  Physical Activity: Sufficiently Active (07/05/2023)   [facility]   Exercise Vital Sign    Days of Exercise per Week: 6 days    Minutes of Exercise per Session: 30 min  Stress: No Stress Concern Present (07/05/2023)   Received from [facility] - Occupational Stress Questionnaire    Feeling of Stress : Not at all  Social Connections: Socially Integrated (07/05/2023)   Received from [facility]   Social Connection and Isolation Panel [NHANES]    Frequency of Communication with Friends and Family: More than three times a week    Frequency of Social Gatherings with Friends and Family: More than three times a week    Attends Religious Services: More than 4 times per year    Active Member of [employer] or Organizations: Yes    Attends Engineer, Structural: More than 4 times per year    Marital Status: Married    Review of Systems: Gen: Denies fever, chills, cold or flu like symptoms, presyncope, syncope. CV: Denies chest pain, palpitations. Resp: Denies dyspnea, cough. GI: See HPI Heme: See HPI  Physical Exam: BP 109/66 (BP Location: Right Arm, Patient Position: Sitting, Cuff Size: Large)   Pulse 93   Temp (!) 96.9 F (36.1 C) (Temporal)   Ht 5' 11 (1.803 m)   Wt 246 lb 6.4 oz (111.8 kg)   BMI 34.37 kg/m  General:   Alert and oriented. No distress noted. Pleasant and cooperative.  Walking with a walker. Head:  Normocephalic and atraumatic. Eyes:  Conjuctiva clear without scleral icterus. Heart:  S1, S2 present without murmurs appreciated. Lungs:  Clear to auscultation bilaterally. No wheezes, rales, or rhonchi. No distress.  Abdomen:  +BS, soft, non-tender and non-distended. No rebound or guarding.  Exam limited due to being performed in exam room chair. Msk:  Symmetrical without gross deformities. Normal posture. Extremities: Bilateral  prosthetics. Neurologic:  Alert and  oriented x4 Psych:   Normal mood and affect.    Assessment:  72 year old male with history of cerebrovascular disease, CKD, diabetes, HTN, HLD, MI, prostate cancer, chronic anemia, radiation proctitis, adenomatous colon polyps, rectal AVMs, presenting today to discuss scheduling surveillance colonoscopy.  Last colonoscopy was January 2020.  3 polyps were resected but 2 were retrieved.  Pathology showed tubular adenomas with recommendations for 5-year surveillance.  In the interim, he had flexible sigmoidoscopy in 2020 s/p APC of radiation proctitis.  Repeat flexible sigmoidoscopy May 2023 with multiple bleeding colonic AVMs s/p APC.  He has been doing well since that time though he did develop a few episodes of rectal bleeding over the last few months with last episode in December 2024.  Bleeding likely related to known radiation proctitis/AVMs.  Encouragingly, most recent hemoglobin has remained stable at 11.4 in January 2025.  Iron panel within normal limits at that time.   Plan:  Proceed with colonoscopy with propofol  by Dr. Cindie in near future. The risks, benefits, and alternatives have been discussed with the patient in detail. The patient states understanding and desires to proceed.  ASA 3 Get approval to hold Plavix  for 5 days prior. Hold semaglutide  x 1 week prior 1 day prior: One half dose of Tresiba  Day of: No morning diabetes medications Follow-up as needed   Josette Rudy RIGGERS [facility] 12/07/2023

## 2023-12-07 ENCOUNTER — Encounter: Payer: Self-pay | Admitting: Gastroenterology

## 2023-12-07 ENCOUNTER — Ambulatory Visit (INDEPENDENT_AMBULATORY_CARE_PROVIDER_SITE_OTHER): Payer: Medicare Other | Admitting: Gastroenterology

## 2023-12-07 VITALS — BP 109/66 | HR 93 | Temp 96.9°F | Ht 71.0 in | Wt 246.4 lb

## 2023-12-07 DIAGNOSIS — K627 Radiation proctitis: Secondary | ICD-10-CM | POA: Diagnosis not present

## 2023-12-07 DIAGNOSIS — Z860101 Personal history of adenomatous and serrated colon polyps: Secondary | ICD-10-CM | POA: Diagnosis not present

## 2023-12-07 DIAGNOSIS — Q2730 Arteriovenous malformation, site unspecified: Secondary | ICD-10-CM

## 2023-12-07 DIAGNOSIS — K625 Hemorrhage of anus and rectum: Secondary | ICD-10-CM

## 2023-12-07 DIAGNOSIS — K5520 Angiodysplasia of colon without hemorrhage: Secondary | ICD-10-CM | POA: Diagnosis not present

## 2023-12-07 NOTE — Patient Instructions (Addendum)
 We will get you scheduled for colonoscopy in the near future with Dr. Cindie.  If you take iron, you will need to hold this for 7 days prior to your procedure.  We are reaching out to your primary care doctor to get approval to hold Plavix  for 5 days prior to your procedure.  You will also need to hold semaglutide  for 1 week prior to procedure.  1 day prior to procedure: Take one half dose of Tresiba  at bedtime  Day of your procedure: Do not take any morning diabetes medications.  Josette Centers, PA-C [facility]

## 2023-12-10 ENCOUNTER — Telehealth: Payer: Self-pay | Admitting: *Deleted

## 2023-12-10 ENCOUNTER — Telehealth: Payer: Self-pay | Admitting: Urology

## 2023-12-10 NOTE — Telephone Encounter (Signed)
 Samples not picked up , sent back to clinic

## 2023-12-10 NOTE — Telephone Encounter (Signed)
  Request for patient to stop PLAVIX  X 5 DAYS  What type of surgery is being performed? COLONOSCOPY  When is surgery scheduled? TBD  Name of physician performing surgery?  Dr. Goble Last [facility] Phone: [phone] Fax: [phone]  Anethesia type (none, local, MAC, general)? MAC

## 2023-12-11 ENCOUNTER — Telehealth: Payer: Self-pay | Admitting: Pharmacy Technician

## 2023-12-11 ENCOUNTER — Other Ambulatory Visit (HOSPITAL_COMMUNITY): Payer: Self-pay

## 2023-12-11 ENCOUNTER — Encounter: Payer: Self-pay | Admitting: Hematology

## 2023-12-11 NOTE — Telephone Encounter (Signed)
Oral Oncology Patient Advocate Encounter  Was successful in securing patient a $8000 grant from [facility] to provide copayment coverage for ZYTIGA.  This will keep the out of pocket expense at $0.     [employer] ID: [id number]   The billing information is as follows and has been shared with [facility].    RxBin: [id number] PCN: [id number] Member ID: [id number] Group ID: [id number] Dates of Eligibility: 11/11/2023 through 11/09/2024  Fund: Prostate  Patty Almedia Balls, CPhT Oncology Pharmacy Patient Advocate [facility] Direct Number: [phone] Fax: [phone]

## 2023-12-12 ENCOUNTER — Other Ambulatory Visit: Payer: Self-pay

## 2023-12-12 ENCOUNTER — Telehealth: Payer: Self-pay | Admitting: Neurology

## 2023-12-12 NOTE — Telephone Encounter (Signed)
Camille from [employer] called wanting to inform the office that a Rx form for pt's Cpap supplies will be faxed over soon and is needing to be signed by the MD.

## 2023-12-12 NOTE — Progress Notes (Signed)
Specialty Pharmacy Refill Coordination Note  [name] is a 73 y.o. male contacted today regarding refills of specialty medication(s) Abiraterone Acetate [name])  Patient requested Delivery   Delivery date: 12/13/23   Verified address: [address] [place], [place] [address]  Medication will be filled on 12/12/23.

## 2023-12-12 NOTE — Telephone Encounter (Signed)
Returned [facility]'s call  and stated that we only see this pt for seizures only and we are not sure why they thought we see for cpap. I asked them to please remove Dr Karie Georges name from pt's account. [employer] stated that they will remove his name and will contact pt to get the correct provider information.

## 2023-12-17 ENCOUNTER — Telehealth: Payer: Self-pay

## 2023-12-17 NOTE — Telephone Encounter (Signed)
 Scanned into media.

## 2023-12-17 NOTE — Telephone Encounter (Signed)
Wants to pick up samples of Gemtesa on Tuesday 12-17-2023.

## 2023-12-17 NOTE — Telephone Encounter (Signed)
Samples placed up front 

## 2023-12-18 NOTE — Telephone Encounter (Signed)
Approval received to hold Plavix x 5 days prior to procedure. Please proceed with scheduling.

## 2023-12-24 MED ORDER — PEG 3350-KCL-NA BICARB-NACL 420 G PO SOLR: 4000.0000 mL | ORAL | 0 refills | Status: DC

## 2023-12-24 NOTE — Telephone Encounter (Signed)
 Spoke with pt. He has been scheduled for 3/31. Aware will send his instructions to him. Rx for prep to be sent to pharmacy. Aware of medications to hold and when.

## 2023-12-24 NOTE — Addendum Note (Signed)
 Addended by: Armstead Peaks on: 12/24/2023 02:49 PM   Modules accepted: Orders

## 2023-12-25 ENCOUNTER — Inpatient Hospital Stay: Payer: Medicare Other | Attending: Hematology | Admitting: Hematology

## 2023-12-25 ENCOUNTER — Inpatient Hospital Stay: Payer: Medicare Other

## 2023-12-25 VITALS — BP 119/79 | HR 86 | Temp 96.6°F | Resp 20

## 2023-12-25 DIAGNOSIS — N189 Chronic kidney disease, unspecified: Secondary | ICD-10-CM | POA: Diagnosis not present

## 2023-12-25 DIAGNOSIS — C61 Malignant neoplasm of prostate: Secondary | ICD-10-CM | POA: Insufficient documentation

## 2023-12-25 DIAGNOSIS — I129 Hypertensive chronic kidney disease with stage 1 through stage 4 chronic kidney disease, or unspecified chronic kidney disease: Secondary | ICD-10-CM | POA: Insufficient documentation

## 2023-12-25 DIAGNOSIS — D631 Anemia in chronic kidney disease: Secondary | ICD-10-CM | POA: Insufficient documentation

## 2023-12-25 LAB — COMPREHENSIVE METABOLIC PANEL
ALT: 20 U/L (ref 0–44)
AST: 31 U/L (ref 15–41)
Albumin: 2.8 g/dL — ABNORMAL LOW (ref 3.5–5.0)
Alkaline Phosphatase: 123 U/L (ref 38–126)
Anion gap: 9 (ref 5–15)
BUN: 33 mg/dL — ABNORMAL HIGH (ref 8–23)
CO2: 24 mmol/L (ref 22–32)
Calcium: 8.7 mg/dL — ABNORMAL LOW (ref 8.9–10.3)
Chloride: 105 mmol/L (ref 98–111)
Creatinine, Ser: 1.71 mg/dL — ABNORMAL HIGH (ref 0.61–1.24)
GFR, Estimated: 42 mL/min — ABNORMAL LOW (ref >60)
Glucose, Bld: 135 mg/dL — ABNORMAL HIGH (ref 70–99)
Potassium: 3.9 mmol/L (ref 3.5–5.1)
Sodium: 138 mmol/L (ref 135–145)
Total Bilirubin: 0.8 mg/dL (ref 0.0–1.2)
Total Protein: 6.2 g/dL — ABNORMAL LOW (ref 6.5–8.1)

## 2023-12-25 LAB — CBC WITH DIFFERENTIAL/PLATELET
Abs Immature Granulocytes: 0.03 10*3/uL (ref 0.00–0.07)
Basophils Absolute: 0 10*3/uL (ref 0.0–0.1)
Basophils Relative: 0 %
Eosinophils Absolute: 0.1 10*3/uL (ref 0.0–0.5)
Eosinophils Relative: 1 %
HCT: 33.7 % — ABNORMAL LOW (ref 39.0–52.0)
Hemoglobin: 11.2 g/dL — ABNORMAL LOW (ref 13.0–17.0)
Immature Granulocytes: 1 %
Lymphocytes Relative: 26 %
Lymphs Abs: 1.6 10*3/uL (ref 0.7–4.0)
MCH: 31.1 pg (ref 26.0–34.0)
MCHC: 33.2 g/dL (ref 30.0–36.0)
MCV: 93.6 fL (ref 80.0–100.0)
Monocytes Absolute: 0.5 10*3/uL (ref 0.1–1.0)
Monocytes Relative: 8 %
Neutro Abs: 4 10*3/uL (ref 1.7–7.7)
Neutrophils Relative %: 64 %
Platelets: 208 10*3/uL (ref 150–400)
RBC: 3.6 MIL/uL — ABNORMAL LOW (ref 4.22–5.81)
RDW: 13.9 % (ref 11.5–15.5)
WBC: 6.3 10*3/uL (ref 4.0–10.5)
nRBC: 0 % (ref 0.0–0.2)

## 2023-12-25 LAB — MAGNESIUM: Magnesium: 2 mg/dL (ref 1.7–2.4)

## 2023-12-25 NOTE — Progress Notes (Signed)
 Patient is taking Zytica as prescribed. He has not missed any doses and reports no side effects at this time.

## 2023-12-25 NOTE — Progress Notes (Signed)
 [name] [address] [place] [address]    Clinic Day:  12/25/23   Referring physician: Benita Stabile, MD  Patient Care Team: Tom Stabile, MD as PCP - General (Internal Medicine) [name], [facility] (Inactive) as Pharmacist (Pharmacist) Tom Bal, DO as Consulting Physician (Internal Medicine) Tom Bal, DO as Consulting Physician (Gastroenterology) Tom Massed, MD as Medical Oncologist (Medical Oncology) Tom Sarah, RN as Oncology Nurse Navigator (Medical Oncology)   ASSESSMENT & PLAN:   Assessment: 1.  Castration refractory prostate cancer, T3 N1 M0: - 04/27/2017: Prostatic adenocarcinoma Gleason 4+5=9 involving both lobes, PSA 9.8 - Status post long-term ADT plus definitive EBRT from 08/27/2017 through 10/26/2017 - History of radiation-induced cystitis and proctitis - Patient on Eligard every 6 months since 03/19/2020 - PSA <0.1 (09/01/2021), 0.2 (03/09/2022), 0.6 (07/05/2022), 0.9 (09/19/2022), 1.8 (2/29/2022) - PSMA PET (12/14/2022): Marked uptake in the prostate gland compatible with residual/recurrent prostate cancer.  No evidence of nodal or distant metastatic disease.  No focal bone mets. - Darolutamide 600 mg twice daily started on 01/16/2023.  Discontinued on 10/11/2023 as he developed seizures. - Invitae germline: MSH 3 heterozygous VUS.  No pathogenic variants detected. - CARIS ASSURE (03/21/2023): No pathogenic tumor derived somatic variants detected.  TMB indeterminate.  MSI indeterminate.  Most likely low tumor circulating cells. - Abiraterone and prednisone started on 11/13/2023   2.  Social/family history: - Lives at home with his wife.  Independent of ADLs and IADLs.  He had right BKA and left AKA from diabetes and has prosthesis.  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  He is a non-smoker. - Father had lung cancer.  Maternal grand father had prostate cancer.  Sister had breast  cancer.    Plan: 1.  M0 Castrate resistant prostate cancer: - As he had seizures on enzalutamide, we switched to abiraterone. - He is tolerating abiraterone and prednisone reasonably well. - He reports occasional bleeding per rectum.  He is scheduled for colonoscopy in March. - Reviewed labs from 12/25/2023: Normal LFTs with albumin low at 2.8.  Potassium was normal.  CBC grossly normal.  Last PSA was 0.03. - Recommend continuing abiraterone 1000 mg daily and prednisone 5 mg daily. - Last Eligard 45 mg was on 10/11/2023.  RTC 4 weeks for follow-up with routine labs.  Will also check PSA and testosterone levels.   2.  Bone health (DEXA 06/22/2022 T-score -0.4): - Continue calcium and vitamin D supplements.  Will repeat DEXA scan in August 2025.   3.  Normocytic anemia: - Combination anemia from CKD and functional iron deficiency.  Last Monoferric was in June 2024.  Last ferritin was 329 and percent saturation 37 and hemoglobin 11.2.  No indication for parenteral iron therapy.  4.  Hypertension: - Continue atenolol 25 mg daily.  Continue Lasix 20 mg daily on Monday, Wednesday and Friday.  Blood pressure today is 120/79.    Orders Placed This Encounter  Procedures   CBC with Differential    Standing Status:   Future    Expected Date:   01/22/2024    Expiration Date:   12/24/2024   Comprehensive metabolic panel    Standing Status:   Future    Expected Date:   01/22/2024    Expiration Date:   12/24/2024   PSA    Standing Status:   Future    Expected Date:   01/22/2024  Expiration Date:   12/24/2024   Testosterone    Standing Status:   Future    Expected Date:   01/22/2024    Expiration Date:   12/24/2024     [name],acting as a scribe for Tom Massed, MD.,have documented all relevant documentation on the behalf of Tom Massed, MD,as directed by  Tom Massed, MD while in the presence of Tom Massed, MD.  I, Tom Massed MD, have reviewed  the above documentation for accuracy and completeness, and I agree with the above.     Tom Massed, MD   2/25/20252:31 PM  CHIEF COMPLAINT:   Diagnosis: prostate cancer    Cancer Staging  Prostate cancer [facility]) Staging form: Prostate, AJCC 8th Edition - Clinical: Stage IVA (cT1c, cN1, cM0, PSA: 9.8, Grade Group: 5) - Signed by Tom Dys, MD on 06/09/2017    Prior Therapy: radiation therapy 10/29-12/28/2018   Current Therapy:  Leurpolide injections and abiraterone plus prednisone   HISTORY OF PRESENT ILLNESS:   Oncology History   No history exists.     INTERVAL HISTORY:   [name] is a 73 y.o. male seen for follow-up of prostate cancer.  He was last seen by me on 11/28/23.  Today, he states that he is doing well overall. His appetite level is at 100%. His energy level is at 75%. [name] is accompanied by his wife. He reports occasional bright red hematochezia and constipation. Constipation began after his last colonoscopy. [name] has a colonoscopy scheduled in March 2025. He is tolerating [place] well.  [name] is taking Xytiga, lasix, atenolol, calcium, and prednisone as prescribed. Blood pressure at home is typically within a normal range. [name] had a fall on 12/23/23 at night, landing on his left knee. There is swelling to the area. He would like to know what he can take for the pain, as he is currently taking Tylenol.  PAST MEDICAL HISTORY:   Past Medical History: Past Medical History:  Diagnosis Date   Arthritis    Cerebrovascular disease    MRI shows Right carotid inferior cavernours narrowing 75% and  50-75% stenosis of Cavernous and supraclinoi right side   CKD (chronic kidney disease) 06/28/2014   Sees Dr [name]   Diabetic retinopathy [facility])    Hyperlipidemia    Hypertension    Myocardial infarction [facility])    "mild" heart attack   Necrosis (HCC)    #2 nail    Pneumonia    as a child   Poor circulation of extremity    Prostate cancer (HCC) 2017   Prostate    Sleep apnea    uses cpap, getting a new one   Type 2 Diabetes mellitus    Type 2    Surgical History: Past Surgical History:  Procedure Laterality Date   ABOVE KNEE LEG AMPUTATION Left 2004   started out below knee and then extended to above knee due to poor healing   AMPUTATION Right 04/28/2015   Procedure: AMPUTATION RAY, RIGHT 5TH TOE;  Surgeon: Eldred Manges, MD;  Location: [facility] OR;  Service: Orthopedics;  Laterality: Right;   AMPUTATION Right 05/10/2015   Procedure: Right Below Knee Amputation;  Surgeon: Eldred Manges, MD;  Location: [facility] OR;  Service: Orthopedics;  Laterality: Right;   CATARACT EXTRACTION W/PHACO  09/05/2012   Procedure: CATARACT EXTRACTION PHACO AND INTRAOCULAR LENS PLACEMENT (IOC);  Surgeon: Gemma Payor, MD;  Location: [facility] ORS;  Service: Ophthalmology;  Laterality: Left;  CDE=5.45   CATARACT EXTRACTION W/PHACO  10/03/2012  Procedure: CATARACT EXTRACTION PHACO AND INTRAOCULAR LENS PLACEMENT (IOC);  Surgeon: Gemma Payor, MD;  Location: [facility] ORS;  Service: Ophthalmology;  Laterality: Right;  CDE: 12.31   COLONOSCOPY  2011   Dr. Arlyce Dice: colon polyps, tubular adenoma   COLONOSCOPY N/A 11/01/2018   Dr. Jena Gauss: Blood noted in the rectal vault.  Vascular pattern of the rectum abnormal, neovascular changes distally and actively oozing.  There were 3 2 to 5 mm polyps in the splenic flexure, cecum removed.  Cecal polyp was not recovered.  Radiation proctitis status post APC treatment.  The splenic flexure polyps were tubular adenomas.  Next colonoscopy in 5 years.   FLEXIBLE SIGMOIDOSCOPY N/A 10/27/2019   Procedure: FLEXIBLE SIGMOIDOSCOPY;  Surgeon: West Bali, MD; rectal bleeding due to radiation proctitis s/p APC therapy (actively bleeding during exam), rectosigmoid colon and sigmoid colon appeared normal.   FLEXIBLE SIGMOIDOSCOPY N/A 03/13/2022   Procedure: FLEXIBLE SIGMOIDOSCOPY;  Surgeon: Tom Bal, DO;  Location: [facility] ENDO SUITE;  Service: Endoscopy;  Laterality: N/A;   1:45pm   HOT HEMOSTASIS  03/13/2022   Procedure: HOT HEMOSTASIS (ARGON PLASMA COAGULATION/BICAP);  Surgeon: Tom Bal, DO;  Location: [facility] END[facility] SUITE;  Service: Endoscopy;;   POLYPECTOMY  11/01/2018   Procedure: POLYPECTOMY;  Surgeon: Corbin Ade, MD;  Location: [facility];  Service: Endoscopy;;  cecum,splenic flexure   REFRACTIVE SURGERY Left 08/01/2021   Cleaned cataract    Social History: Social History   Socioeconomic History   Marital status: Married    Spouse name: Not on file   Number of children: 2   Years of education: college   Highest education level: Not on file  Occupational History   Occupation: Engineer, materials: [employer]  Tobacco Use   Smoking status: Never    Passive exposure: Never   Smokeless tobacco: Never  Vaping Use   Vaping status: Never Used  Substance and Sexual Activity   Alcohol use: No   Drug use: No   Sexual activity: Not Currently  Other Topics Concern   Not on file  Social History Narrative   Patient lives with his wife Patient right handedPatient drinks caffine on occ.   Pt retired    Teacher, early years/pre Strain: Low Risk  (07/05/2023)   Received from [facility]   Overall Financial Resource Strain (CARDIA)    Difficulty of Paying Living Expenses: Not hard at all  Food Insecurity: No Food Insecurity (07/05/2023)   Received from [place]   Hunger Vital Sign    Worried About Running Out of Food in the Last Year: Never true    Ran Out of Food in the Last Year: Never true  Transportation Needs: No Transportation Needs (07/05/2023)   Received from [facility] - Transportation    Lack of Transportation (Medical): No    Lack of Transportation (Non-Medical): No  Physical Activity: Sufficiently Active (07/05/2023)   Received from [facility]   Exercise Vital Sign    Days of Exercise per Week: 6 days    Minutes of Exercise per Session: 30 min  Stress: No Stress  Concern Present (07/05/2023)   Received from [facility] - Occupational Stress Questionnaire    Feeling of Stress : Not at all  Social Connections: Socially Integrated (07/05/2023)   Received from [facility]   Social Connection and Isolation Panel [NHANES]  Frequency of Communication with Friends and Family: More than three times a week    Frequency of Social Gatherings with Friends and Family: More than three times a week    Attends Religious Services: More than 4 times per year    Active Member of [employer] or Organizations: Yes    Attends Engineer, structural: More than 4 times per year    Marital Status: Married  Catering manager Violence: Not At Risk (07/05/2023)   Received from [facility]   Humiliation, Afraid, Rape, and Kick questionnaire    Fear of Current or Ex-Partner: No    Emotionally Abused: No    Physically Abused: No    Sexually Abused: No    Family History: Family History  Problem Relation Age of Onset   Diabetes Mother    Hypertension Mother    Stroke Mother    Lung cancer Father 50   Breast cancer Sister 27   Stroke Sister    Stroke Sister    Prostate cancer Maternal Grandfather    Stroke Maternal Grandfather    Sickle cell anemia Daughter    Prostate cancer Cousin    Prostate cancer Cousin    Colon cancer Neg Hx    Seizures Neg Hx     Current Medications:  Current Outpatient Medications:    abiraterone acetate (ZYTIGA) 250 MG tablet, Take 4 tablets (1,000 mg total) by mouth daily. Take on an empty stomach 1 hour before or 2 hours after a meal, Disp: 120 tablet, Rfl: 2   acetaminophen (TYLENOL) 325 MG tablet, Take 650 mg by mouth daily as needed for mild pain, moderate pain or headache., Disp: , Rfl:    atenolol (TENORMIN) 25 MG tablet, TAKE 1 TABLET BY MOUTH EVERY DAY, Disp: 90 tablet, Rfl: 1   atorvastatin (LIPITOR) 80 MG tablet, TAKE 1 TABLET BY MOUTH EVERY DAY, Disp: 90 tablet, Rfl: 1   B-D  UF III MINI PEN NEEDLES 31G X 5 MM MISC, USE FOUR TIMES DAILY AS DIRECTED, Disp: 150 each, Rfl: 2   Calcium Carb-Cholecalciferol 600-500 MG-UNIT CAPS, Take 2 capsules by mouth daily. , Disp: , Rfl:    clopidogrel (PLAVIX) 75 MG tablet, TAKE 1 TABLET BY MOUTH EVERY DAY, Disp: 90 tablet, Rfl: 1   CONTOUR TEST test strip, TEST TWICE DAILY E11.65, Disp: 200 each, Rfl: 2   furosemide (LASIX) 20 MG tablet, Take 20 mg by mouth 3 (three) times a week., Disp: , Rfl:    insulin degludec (TRESIBA FLEXTOUCH) 100 UNIT/ML FlexTouch Pen, Inject 20 Units into the skin at bedtime., Disp: 6 mL, Rfl: 0   latanoprost (XALATAN) 0.005 % ophthalmic solution, Place 1 drop into both eyes at bedtime., Disp: , Rfl:    levETIRAcetam (KEPPRA) 250 MG tablet, Take 1 tablet (250 mg total) by mouth 2 (two) times daily., Disp: 60 tablet, Rfl: 6   polyethylene glycol-electrolytes (NULYTELY) 420 g solution, Take 4,000 mLs by mouth as directed., Disp: 4000 mL, Rfl: 0   predniSONE (DELTASONE) 5 MG tablet, Take 1 tablet (5 mg total) by mouth daily with breakfast., Disp: 30 tablet, Rfl: 5   prochlorperazine (COMPAZINE) 10 MG tablet, Take 1 tablet (10 mg total) by mouth every 6 (six) hours as needed for nausea or vomiting., Disp: 30 tablet, Rfl: 1   Semaglutide, 1 MG/DOSE, 4 MG/3ML SOPN, Inject 1 mg as directed once a week., Disp: 6 mL, Rfl: 3   Vibegron (GEMTESA) 75 MG TABS, Take 1 tablet (75 mg total) by  mouth daily., Disp: 30 tablet, Rfl: 11   Vibegron (GEMTESA) 75 MG TABS, Take 1 tablet (75 mg total) by mouth daily., Disp: , Rfl:    Allergies: Allergies  Allergen Reactions   Zolpidem Tartrate Other (See Comments)    disorientation     REVIEW OF SYSTEMS:   Review of Systems  Constitutional:  Negative for chills, fatigue and fever.  HENT:   Negative for lump/mass, mouth sores, nosebleeds, sore throat and trouble swallowing.   Eyes:  Negative for eye problems.  Respiratory:  Positive for cough. Negative for shortness of  breath.   Cardiovascular:  Negative for chest pain, leg swelling and palpitations.  Gastrointestinal:  Positive for blood in stool (occasionally). Negative for abdominal pain, constipation, diarrhea, nausea and vomiting.  Genitourinary:  Negative for bladder incontinence, difficulty urinating, dysuria, frequency, hematuria and nocturia.   Musculoskeletal:  Negative for arthralgias, back pain, flank pain, myalgias and neck pain.       +pain in left leg and right knee, 6/10 severity  Skin:  Negative for itching and rash.  Neurological:  Positive for numbness (in hands). Negative for dizziness and headaches.  Hematological:  Does not bruise/bleed easily.  Psychiatric/Behavioral:  Negative for depression, sleep disturbance and suicidal ideas. The patient is not nervous/anxious.   All other systems reviewed and are negative.    VITALS:   Blood pressure 119/79, pulse 86, temperature (!) 96.6 F (35.9 C), temperature source Tympanic, resp. rate 20, SpO2 100%.  Wt Readings from Last 3 Encounters:  12/07/23 246 lb 6.4 oz (111.8 kg)  11/23/23 250 lb (113.4 kg)  10/29/23 248 lb 6.4 oz (112.7 kg)    There is no height or weight on file to calculate BMI.  Performance status (ECOG): 1 - Symptomatic but completely ambulatory  PHYSICAL EXAM:   Physical Exam Vitals and nursing note reviewed. Exam conducted with a chaperone present.  Constitutional:      Appearance: Normal appearance.  Cardiovascular:     Rate and Rhythm: Normal rate and regular rhythm.     Pulses: Normal pulses.     Heart sounds: Normal heart sounds.  Pulmonary:     Effort: Pulmonary effort is normal.     Breath sounds: Normal breath sounds.  Abdominal:     Palpations: Abdomen is soft. There is no hepatomegaly, splenomegaly or mass.     Tenderness: There is no abdominal tenderness.  Musculoskeletal:     Right lower leg: No edema.     Left lower leg: No edema.  Lymphadenopathy:     Cervical: No cervical adenopathy.      Right cervical: No superficial, deep or posterior cervical adenopathy.    Left cervical: No superficial, deep or posterior cervical adenopathy.     Upper Body:     Right upper body: No supraclavicular or axillary adenopathy.     Left upper body: No supraclavicular or axillary adenopathy.  Neurological:     General: No focal deficit present.     Mental Status: He is alert and oriented to person, place, and time.  Psychiatric:        Mood and Affect: Mood normal.        Behavior: Behavior normal.     LABS:      Latest Ref Rng & Units 12/25/2023    9:10 AM 11/21/2023    1:32 PM 10/29/2023   11:10 AM  CBC  WBC 4.0 - 10.5 K/uL 6.3  6.7  6.1   Hemoglobin 13.0 - 17.0 g/dL  11.2  11.4  11.4   Hematocrit 39.0 - 52.0 % 33.7  34.3  35.2   Platelets 150 - 400 K/uL 208  249  252       Latest Ref Rng & Units 12/25/2023    9:10 AM 11/21/2023    1:32 PM 10/29/2023   11:10 AM  CMP  Glucose 70 - 99 mg/dL 161  096  045   BUN 8 - 23 mg/dL 33  27  25   Creatinine 0.61 - 1.24 mg/dL 4.09  8.11  9.14   Sodium 135 - 145 mmol/L 138  140  138   Potassium 3.5 - 5.1 mmol/L 3.9  4.0  4.4   Chloride 98 - 111 mmol/L 105  105  105   CO2 22 - 32 mmol/L 24  25  26    Calcium 8.9 - 10.3 mg/dL 8.7  8.7  8.9   Total Protein 6.5 - 8.1 g/dL 6.2  6.7  6.7   Total Bilirubin 0.0 - 1.2 mg/dL 0.8  0.8  0.6   Alkaline Phos 38 - 126 U/L 123  120  93   AST 15 - 41 U/L 31  24  36   ALT 0 - 44 U/L 20  20  39      No results found for: "CEA1", "CEA" / No results found for: "CEA1", "CEA" Lab Results  Component Value Date   PSA1 1.8 12/28/2022   No results found for: "NWG956" No results found for: "CAN125"  No results found for: "TOTALPROTELP", "ALBUMINELP", "A1GS", "A2GS", "BETS", "BETA2SER", "GAMS", "MSPIKE", "SPEI" Lab Results  Component Value Date   TIBC 234 (L) 11/21/2023   TIBC 221 (L) 08/21/2023   TIBC 218 (L) 05/15/2023   FERRITIN 329 11/21/2023   FERRITIN 402 (H) 08/21/2023   FERRITIN 475 (H)  05/15/2023   IRONPCTSAT 37 11/21/2023   IRONPCTSAT 36 08/21/2023   IRONPCTSAT 37 05/15/2023   No results found for: "LDH"   STUDIES:   EEG adult Result Date: 11/27/2023 Windell Norfolk, MD     11/27/2023  1:08 PM History: 73 year old man with seizure EEG classification: Awake and drowsy Duration: 25 minutes Technical aspects: This EEG study was done with scalp electrodes positioned according to the 10-20 International system of electrode placement. Electrical activity was reviewed with band pass filter of 1-70Hz , sensitivity of 7 uV/mm, display speed of 79mm/sec with a 60Hz  notched filter applied as appropriate. EEG data were recorded continuously and digitally stored. Description of the recording: The background rhythms of this recording consists of a fairly well modulated medium amplitude alpha rhythm of 9 Hz that is reactive to eye opening and closure. Present in the anterior head region is a 15-20 Hz beta activity. Photic stimulation was performed, did not show any abnormalities. Hyperventilation was not performed. Drowsiness was manifested by background fragmentation. No abnormal epileptiform discharges seen during this recording. There was no focal slowing. There were no electrographic seizure identified. Abnormality: None Impression: This is a normal awake and drowsy EEG. No evidence of interictal epileptiform discharges. Normal EEGs, however, do not rule out epilepsy. Windell Norfolk, MD [facility]

## 2023-12-25 NOTE — Patient Instructions (Addendum)
 [facility] at [facility] Discharge Instructions   You were seen and examined today by Dr. Ellin Saba.  He reviewed the results of your lab work which are normal/stable.   Continue Zytiga as prescribed.   We will see you back in 4 weeks. We will repeat lab work prior.   Return as scheduled.    Thank you for choosing [facility] at [facility] to provide your oncology and hematology care.  To afford each patient quality time with our provider, please arrive at least 15 minutes before your scheduled appointment time.   If you have a lab appointment with the Cancer Center please come in thru the Main Entrance and check in at the main information desk.  You need to re-schedule your appointment should you arrive 10 or more minutes late.  We strive to give you quality time with our providers, and arriving late affects you and other patients whose appointments are after yours.  Also, if you no show three or more times for appointments you may be dismissed from the clinic at the providers discretion.     Again, thank you for choosing [facility].  Our hope is that these requests will decrease the amount of time that you wait before being seen by our physicians.       _____________________________________________________________  Should you have questions after your visit to [facility], please contact our office at [phone] and follow the prompts.  Our office hours are 8:00 a.m. and 4:30 p.m. Monday - Friday.  Please note that voicemails left after 4:00 p.m. may not be returned until the following business day.  We are closed weekends and major holidays.  You do have access to a nurse 24-7, just call the main number to the clinic [phone] and do not press any options, hold on the line and a nurse will answer the phone.    For prescription refill requests, have your pharmacy contact our office and allow 72 hours.     Due to Covid, you will need to wear a mask upon entering the hospital. If you do not have a mask, a mask will be given to you at the Main Entrance upon arrival. For doctor visits, patients may have 1 support person age 9 or older with them. For treatment visits, patients can not have anyone with them due to social distancing guidelines and our immunocompromised population.

## 2023-12-28 ENCOUNTER — Encounter: Payer: Self-pay | Admitting: *Deleted

## 2023-12-31 ENCOUNTER — Other Ambulatory Visit: Payer: Self-pay

## 2023-12-31 DIAGNOSIS — E559 Vitamin D deficiency, unspecified: Secondary | ICD-10-CM | POA: Diagnosis not present

## 2023-12-31 DIAGNOSIS — E782 Mixed hyperlipidemia: Secondary | ICD-10-CM | POA: Diagnosis not present

## 2023-12-31 DIAGNOSIS — E1122 Type 2 diabetes mellitus with diabetic chronic kidney disease: Secondary | ICD-10-CM | POA: Diagnosis not present

## 2024-01-01 ENCOUNTER — Other Ambulatory Visit (HOSPITAL_COMMUNITY): Payer: Self-pay

## 2024-01-02 LAB — LAB REPORT - SCANNED
A1c: 6.9
Creatinine, POC: 18.4 mg/dL
EGFR: 50

## 2024-01-03 ENCOUNTER — Encounter: Payer: Self-pay | Admitting: Internal Medicine

## 2024-01-03 ENCOUNTER — Other Ambulatory Visit: Payer: Self-pay

## 2024-01-03 DIAGNOSIS — C61 Malignant neoplasm of prostate: Secondary | ICD-10-CM | POA: Diagnosis not present

## 2024-01-03 DIAGNOSIS — I129 Hypertensive chronic kidney disease with stage 1 through stage 4 chronic kidney disease, or unspecified chronic kidney disease: Secondary | ICD-10-CM | POA: Diagnosis not present

## 2024-01-03 DIAGNOSIS — N1831 Chronic kidney disease, stage 3a: Secondary | ICD-10-CM | POA: Diagnosis not present

## 2024-01-03 DIAGNOSIS — N3281 Overactive bladder: Secondary | ICD-10-CM | POA: Diagnosis not present

## 2024-01-03 DIAGNOSIS — E782 Mixed hyperlipidemia: Secondary | ICD-10-CM | POA: Diagnosis not present

## 2024-01-03 DIAGNOSIS — E1122 Type 2 diabetes mellitus with diabetic chronic kidney disease: Secondary | ICD-10-CM | POA: Diagnosis not present

## 2024-01-03 DIAGNOSIS — E113291 Type 2 diabetes mellitus with mild nonproliferative diabetic retinopathy without macular edema, right eye: Secondary | ICD-10-CM | POA: Diagnosis not present

## 2024-01-03 DIAGNOSIS — R569 Unspecified convulsions: Secondary | ICD-10-CM | POA: Diagnosis not present

## 2024-01-03 DIAGNOSIS — I10 Essential (primary) hypertension: Secondary | ICD-10-CM | POA: Diagnosis not present

## 2024-01-03 DIAGNOSIS — I739 Peripheral vascular disease, unspecified: Secondary | ICD-10-CM | POA: Diagnosis not present

## 2024-01-03 DIAGNOSIS — Z89511 Acquired absence of right leg below knee: Secondary | ICD-10-CM | POA: Diagnosis not present

## 2024-01-03 DIAGNOSIS — Z89619 Acquired absence of unspecified leg above knee: Secondary | ICD-10-CM | POA: Diagnosis not present

## 2024-01-04 ENCOUNTER — Other Ambulatory Visit (HOSPITAL_COMMUNITY): Payer: Self-pay

## 2024-01-07 ENCOUNTER — Other Ambulatory Visit: Payer: Self-pay

## 2024-01-07 NOTE — Progress Notes (Signed)
 Specialty Pharmacy Refill Coordination Note  [name] is a 73 y.o. male contacted today regarding refills of specialty medication(s) Abiraterone Acetate [name])   Patient requested Delivery   Delivery date: 01/08/24   Verified address: [address] [place], [place] [address]   Medication will be filled on 03.10.25.

## 2024-01-09 ENCOUNTER — Other Ambulatory Visit: Payer: Self-pay

## 2024-01-10 ENCOUNTER — Telehealth: Payer: Self-pay | Admitting: Nurse Practitioner

## 2024-01-10 NOTE — Telephone Encounter (Signed)
 Patient made aware that his tresiba is here from PAP

## 2024-01-15 NOTE — Telephone Encounter (Signed)
 Patient Assistance was picked up

## 2024-01-21 ENCOUNTER — Inpatient Hospital Stay: Payer: Medicare Other | Attending: Hematology

## 2024-01-21 DIAGNOSIS — Z7952 Long term (current) use of systemic steroids: Secondary | ICD-10-CM | POA: Insufficient documentation

## 2024-01-21 DIAGNOSIS — Z79899 Other long term (current) drug therapy: Secondary | ICD-10-CM | POA: Insufficient documentation

## 2024-01-21 DIAGNOSIS — C61 Malignant neoplasm of prostate: Secondary | ICD-10-CM | POA: Diagnosis not present

## 2024-01-21 DIAGNOSIS — Z923 Personal history of irradiation: Secondary | ICD-10-CM | POA: Insufficient documentation

## 2024-01-21 DIAGNOSIS — E1122 Type 2 diabetes mellitus with diabetic chronic kidney disease: Secondary | ICD-10-CM | POA: Diagnosis not present

## 2024-01-21 DIAGNOSIS — D631 Anemia in chronic kidney disease: Secondary | ICD-10-CM | POA: Diagnosis not present

## 2024-01-21 DIAGNOSIS — Z794 Long term (current) use of insulin: Secondary | ICD-10-CM | POA: Diagnosis not present

## 2024-01-21 DIAGNOSIS — I129 Hypertensive chronic kidney disease with stage 1 through stage 4 chronic kidney disease, or unspecified chronic kidney disease: Secondary | ICD-10-CM | POA: Insufficient documentation

## 2024-01-21 DIAGNOSIS — Z7985 Long-term (current) use of injectable non-insulin antidiabetic drugs: Secondary | ICD-10-CM | POA: Diagnosis not present

## 2024-01-21 DIAGNOSIS — N189 Chronic kidney disease, unspecified: Secondary | ICD-10-CM | POA: Insufficient documentation

## 2024-01-21 DIAGNOSIS — Z7902 Long term (current) use of antithrombotics/antiplatelets: Secondary | ICD-10-CM | POA: Diagnosis not present

## 2024-01-21 LAB — CBC WITH DIFFERENTIAL/PLATELET
Abs Immature Granulocytes: 0.03 10*3/uL (ref 0.00–0.07)
Basophils Absolute: 0 10*3/uL (ref 0.0–0.1)
Basophils Relative: 1 %
Eosinophils Absolute: 0.1 10*3/uL (ref 0.0–0.5)
Eosinophils Relative: 1 %
HCT: 33.3 % — ABNORMAL LOW (ref 39.0–52.0)
Hemoglobin: 11.1 g/dL — ABNORMAL LOW (ref 13.0–17.0)
Immature Granulocytes: 1 %
Lymphocytes Relative: 21 %
Lymphs Abs: 1.2 10*3/uL (ref 0.7–4.0)
MCH: 31.5 pg (ref 26.0–34.0)
MCHC: 33.3 g/dL (ref 30.0–36.0)
MCV: 94.6 fL (ref 80.0–100.0)
Monocytes Absolute: 0.4 10*3/uL (ref 0.1–1.0)
Monocytes Relative: 7 %
Neutro Abs: 3.9 10*3/uL (ref 1.7–7.7)
Neutrophils Relative %: 69 %
Platelets: 184 10*3/uL (ref 150–400)
RBC: 3.52 MIL/uL — ABNORMAL LOW (ref 4.22–5.81)
RDW: 14.6 % (ref 11.5–15.5)
WBC: 5.6 10*3/uL (ref 4.0–10.5)
nRBC: 0 % (ref 0.0–0.2)

## 2024-01-21 LAB — COMPREHENSIVE METABOLIC PANEL
ALT: 16 U/L (ref 0–44)
AST: 21 U/L (ref 15–41)
Albumin: 2.7 g/dL — ABNORMAL LOW (ref 3.5–5.0)
Alkaline Phosphatase: 204 U/L — ABNORMAL HIGH (ref 38–126)
Anion gap: 9 (ref 5–15)
BUN: 28 mg/dL — ABNORMAL HIGH (ref 8–23)
CO2: 25 mmol/L (ref 22–32)
Calcium: 8.5 mg/dL — ABNORMAL LOW (ref 8.9–10.3)
Chloride: 106 mmol/L (ref 98–111)
Creatinine, Ser: 1.53 mg/dL — ABNORMAL HIGH (ref 0.61–1.24)
GFR, Estimated: 48 mL/min — ABNORMAL LOW (ref >60)
Glucose, Bld: 149 mg/dL — ABNORMAL HIGH (ref 70–99)
Potassium: 3.8 mmol/L (ref 3.5–5.1)
Sodium: 140 mmol/L (ref 135–145)
Total Bilirubin: 0.6 mg/dL (ref 0.0–1.2)
Total Protein: 6.2 g/dL — ABNORMAL LOW (ref 6.5–8.1)

## 2024-01-21 LAB — PSA: Prostatic Specific Antigen: 0.01 ng/mL (ref 0.00–4.00)

## 2024-01-22 LAB — TESTOSTERONE: Testosterone: <3 ng/dL — ABNORMAL LOW (ref 264–916)

## 2024-01-23 ENCOUNTER — Inpatient Hospital Stay (HOSPITAL_BASED_OUTPATIENT_CLINIC_OR_DEPARTMENT_OTHER): Payer: Medicare Other | Admitting: Hematology

## 2024-01-23 ENCOUNTER — Encounter (HOSPITAL_COMMUNITY): Payer: Self-pay

## 2024-01-23 VITALS — BP 111/83 | HR 91 | Temp 97.6°F | Resp 18 | Wt 243.0 lb

## 2024-01-23 DIAGNOSIS — C61 Malignant neoplasm of prostate: Secondary | ICD-10-CM | POA: Diagnosis not present

## 2024-01-23 DIAGNOSIS — E1122 Type 2 diabetes mellitus with diabetic chronic kidney disease: Secondary | ICD-10-CM | POA: Diagnosis not present

## 2024-01-23 DIAGNOSIS — D631 Anemia in chronic kidney disease: Secondary | ICD-10-CM | POA: Diagnosis not present

## 2024-01-23 DIAGNOSIS — D509 Iron deficiency anemia, unspecified: Secondary | ICD-10-CM

## 2024-01-23 DIAGNOSIS — N189 Chronic kidney disease, unspecified: Secondary | ICD-10-CM | POA: Diagnosis not present

## 2024-01-23 DIAGNOSIS — Z7902 Long term (current) use of antithrombotics/antiplatelets: Secondary | ICD-10-CM | POA: Diagnosis not present

## 2024-01-23 DIAGNOSIS — I129 Hypertensive chronic kidney disease with stage 1 through stage 4 chronic kidney disease, or unspecified chronic kidney disease: Secondary | ICD-10-CM | POA: Diagnosis not present

## 2024-01-23 NOTE — Progress Notes (Signed)
 Patient is taking Zytiga as prescribed. He has not missed any doses and reports no side effects at this time.

## 2024-01-23 NOTE — Patient Instructions (Addendum)
 [facility] at [facility] Discharge Instructions   You were seen and examined today by Dr. Ellin Saba.  He reviewed the results of your lab work which are normal/stable.   Continue Zytiga and prednisone as prescribed.   We will see you back in 8 weeks. We will repeat lab work prior to this visit.   Return as scheduled.    Thank you for choosing [facility] at [facility] to provide your oncology and hematology care.  To afford each patient quality time with our provider, please arrive at least 15 minutes before your scheduled appointment time.   If you have a lab appointment with the Cancer Center please come in thru the Main Entrance and check in at the main information desk.  You need to re-schedule your appointment should you arrive 10 or more minutes late.  We strive to give you quality time with our providers, and arriving late affects you and other patients whose appointments are after yours.  Also, if you no show three or more times for appointments you may be dismissed from the clinic at the providers discretion.     Again, thank you for choosing [facility].  Our hope is that these requests will decrease the amount of time that you wait before being seen by our physicians.       _____________________________________________________________  Should you have questions after your visit to [facility], please contact our office at [phone] and follow the prompts.  Our office hours are 8:00 a.m. and 4:30 p.m. Monday - Friday.  Please note that voicemails left after 4:00 p.m. may not be returned until the following business day.  We are closed weekends and major holidays.  You do have access to a nurse 24-7, just call the main number to the clinic [phone] and do not press any options, hold on the line and a nurse will answer the phone.    For prescription refill requests, have your pharmacy contact our  office and allow 72 hours.    Due to Covid, you will need to wear a mask upon entering the hospital. If you do not have a mask, a mask will be given to you at the Main Entrance upon arrival. For doctor visits, patients may have 1 support person age 23 or older with them. For treatment visits, patients can not have anyone with them due to social distancing guidelines and our immunocompromised population.

## 2024-01-23 NOTE — Progress Notes (Signed)
 [facility] [address] [place] [address]    Clinic Day:  01/23/24   Referring physician: Benita Stabile, MD  Patient Care Team: Benita Stabile, MD as PCP - General (Internal Medicine) Erroll Luna, [facility] (Inactive) as Pharmacist (Pharmacist) Lanelle Bal, DO as Consulting Physician (Internal Medicine) Lanelle Bal, DO as Consulting Physician (Gastroenterology) Doreatha Massed, MD as Medical Oncologist (Medical Oncology) Therese Sarah, RN as Oncology Nurse Navigator (Medical Oncology)   ASSESSMENT & PLAN:   Assessment: 1.  Castration refractory prostate cancer, T3 N1 M0: - 04/27/2017: Prostatic adenocarcinoma Gleason 4+5=9 involving both lobes, PSA 9.8 - Status post long-term ADT plus definitive EBRT from 08/27/2017 through 10/26/2017 - History of radiation-induced cystitis and proctitis - Patient on Eligard every 6 months since 03/19/2020 - PSA <0.1 (09/01/2021), 0.2 (03/09/2022), 0.6 (07/05/2022), 0.9 (09/19/2022), 1.8 (2/29/2022) - PSMA PET (12/14/2022): Marked uptake in the prostate gland compatible with residual/recurrent prostate cancer.  No evidence of nodal or distant metastatic disease.  No focal bone mets. - Darolutamide 600 mg twice daily started on 01/16/2023.  Discontinued on 10/11/2023 as he developed seizures. - Invitae germline: MSH 3 heterozygous VUS.  No pathogenic variants detected. - CARIS ASSURE (03/21/2023): No pathogenic tumor derived somatic variants detected.  TMB indeterminate.  MSI indeterminate.  Most likely low tumor circulating cells. - Abiraterone and prednisone started on 11/13/2023 (enzalutamide discontinued due to seizures)   2.  Social/family history: - Lives at home with his wife.  Independent of ADLs and IADLs.  He had right BKA and left AKA from diabetes and has prosthesis.  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  He is a non-smoker. - Father had lung cancer.  Maternal grand father had  prostate cancer.  Sister had breast cancer.    Plan: 1.  M0 Castrate resistant prostate cancer: - He is tolerating abiraterone and prednisone very well. - He had a fall while coming to the clinic this morning. - Labs today: LFTs are stable except mildly elevated alk phos of 204.  [name] is 2.7 and stable.  Potassium was normal at 3.8.  CBC grossly normal.  PSA improved to 0.01.  Testosterone is in the castrate range.  Blood pressure is normal. - Continue abiraterone 1000 mg daily and prednisone 5 mg daily.  Last Eligard on 10/11/2023. - RTC 8 weeks for follow-up with repeat PSA and other labs.   2.  Bone health (DEXA 06/22/2022 T-score -0.4): - He will continue calcium and vitamin D supplements.  Will plan on repeating DEXA scan in August 2025.   3.  Normocytic anemia: - Combination anemia from CKD and functional iron deficiency.  Last Monoferric was in June 2024. - Last ferritin was 329 and percent saturation 37.  Hemoglobin stable between 11-12.  Will check ferritin and iron panel at next visit.  4.  Hypertension: - Continue atenolol 25 mg daily.  Blood pressure is well-controlled at 110/83.  Continue Lasix 20 mg daily on Monday Wednesday Friday.    Orders Placed This Encounter  Procedures  . CBC with Differential    Standing Status:   Future    Expected Date:   03/19/2024    Expiration Date:   01/22/2025  . Comprehensive metabolic panel    Standing Status:   Future    Expected Date:   03/19/2024    Expiration Date:   01/22/2025  . PSA    Standing Status:  Future    Expected Date:   03/19/2024    Expiration Date:   01/22/2025  . Iron and TIBC ([facility] ONLY)    Standing Status:   Future    Expected Date:   03/19/2024    Expiration Date:   01/22/2025  . Ferritin    Standing Status:   Future    Expected Date:   03/19/2024    Expiration Date:   01/22/2025      [name],acting as a scribe for Doreatha Massed, MD.,have documented all relevant  documentation on the behalf of Doreatha Massed, MD,as directed by  Doreatha Massed, MD while in the presence of Doreatha Massed, MD.  I, Doreatha Massed MD, have reviewed the above documentation for accuracy and completeness, and I agree with the above.      Doreatha Massed, MD   3/26/202511:39 AM  CHIEF COMPLAINT:   Diagnosis: prostate cancer    Cancer Staging  Prostate cancer [facility]) Staging form: Prostate, AJCC 8th Edition - Clinical: Stage IVA (cT1c, cN1, cM0, PSA: 9.8, Grade Group: 5) - Signed by Margaretmary Dys, MD on 06/09/2017    Prior Therapy: radiation therapy 10/29-12/28/2018   Current Therapy:  Leurpolide injections and abiraterone plus prednisone   HISTORY OF PRESENT ILLNESS:   Oncology History   No history exists.     INTERVAL HISTORY:   [name] is a 73 y.o. male seen for follow-up of prostate cancer.  He was last seen by me on 12/25/23.  Trelon has a colonoscopy scheduled for 01/28/24 under Dr. Marletta Lor.   Today, he states that he is doing well overall. His appetite level is at 100%. His energy level is at 75%.   PAST MEDICAL HISTORY:   Past Medical History: Past Medical History:  Diagnosis Date  . Arthritis   . Cerebrovascular disease    MRI shows Right carotid inferior cavernours narrowing 75% and  50-75% stenosis of Cavernous and supraclinoi right side  . CKD (chronic kidney disease) 06/28/2014   Sees Dr Lowell Guitar  . Diabetic retinopathy (HCC)   . Hyperlipidemia   . Hypertension   . Myocardial infarction (HCC)    "mild" heart attack  . Necrosis (HCC)    #2 nail   . Pneumonia    as a child  . Poor circulation of extremity   . Prostate cancer (HCC) 2017   Prostate  . Sleep apnea    uses cpap, getting a new one  . Type 2 Diabetes mellitus    Type 2    Surgical History: Past Surgical History:  Procedure Laterality Date  . ABOVE KNEE LEG AMPUTATION Left 2004   started out below knee and then extended to above knee due to poor  healing  . AMPUTATION Right 04/28/2015   Procedure: AMPUTATION RAY, RIGHT 5TH TOE;  Surgeon: Eldred Manges, MD;  Location: [facility] OR;  Service: Orthopedics;  Laterality: Right;  . AMPUTATION Right 05/10/2015   Procedure: Right Below Knee Amputation;  Surgeon: Eldred Manges, MD;  Location: [facility] OR;  Service: Orthopedics;  Laterality: Right;  . CATARACT EXTRACTION W/PHACO  09/05/2012   Procedure: CATARACT EXTRACTION PHACO AND INTRAOCULAR LENS PLACEMENT (IOC);  Surgeon: Gemma Payor, MD;  Location: [facility] ORS;  Service: Ophthalmology;  Laterality: Left;  CDE=5.45  . CATARACT EXTRACTION W/PHACO  10/03/2012   Procedure: CATARACT EXTRACTION PHACO AND INTRAOCULAR LENS PLACEMENT (IOC);  Surgeon: Gemma Payor, MD;  Location: [facility] ORS;  Service: Ophthalmology;  Laterality: Right;  CDE: 12.31  . COLONOSCOPY  2011  Dr. Arlyce Dice: colon polyps, tubular adenoma  . COLONOSCOPY N/A 11/01/2018   Dr. Jena Gauss: Blood noted in the rectal vault.  Vascular pattern of the rectum abnormal, neovascular changes distally and actively oozing.  There were 3 2 to 5 mm polyps in the splenic flexure, cecum removed.  Cecal polyp was not recovered.  Radiation proctitis status post APC treatment.  The splenic flexure polyps were tubular adenomas.  Next colonoscopy in 5 years.  Marland Kitchen FLEXIBLE SIGMOIDOSCOPY N/A 10/27/2019   Procedure: FLEXIBLE SIGMOIDOSCOPY;  Surgeon: West Bali, MD; rectal bleeding due to radiation proctitis s/p APC therapy (actively bleeding during exam), rectosigmoid colon and sigmoid colon appeared normal.  . FLEXIBLE SIGMOIDOSCOPY N/A 03/13/2022   Procedure: FLEXIBLE SIGMOIDOSCOPY;  Surgeon: Lanelle Bal, DO;  Location: [facility]ITE;  Service: Endoscopy;  Laterality: N/A;  1:45pm  . HOT HEMOSTASIS  03/13/2022   Procedure: HOT HEMOSTASIS (ARGON PLASMA COAGULATION/BICAP);  Surgeon: Lanelle Bal, DO;  Location: [facility];  Service: Endoscopy;;  . POLYPECTOMY  11/01/2018   Procedure: POLYPECTOMY;  Surgeon: Corbin Ade,  MD;  Location: [facility];  Service: Endoscopy;;  cecum,splenic flexure  . REFRACTIVE SURGERY Left 08/01/2021   Cleaned cataract    Social History: Social History   Socioeconomic History  . Marital status: Married    Spouse name: Not on file  . Number of children: 2  . Years of education: college  . Highest education level: Not on file  Occupational History  . Occupation: Engineer, materials: [employer]  Tobacco Use  . Smoking status: Never    Passive exposure: Never  . Smokeless tobacco: Never  Vaping Use  . Vaping status: Never Used  Substance and Sexual Activity  . Alcohol use: No  . Drug use: No  . Sexual activity: Not Currently  Other Topics Concern  . Not on file  Social History Narrative   Patient lives with his wife Patient right handedPatient drinks caffine on occ.   Pt retired    Teacher, early years/pre Strain: Low Risk  (07/05/2023)   Received from [facility]   Overall Financial Resource Strain (CARDIA)   . Difficulty of Paying Living Expenses: Not hard at all  Food Insecurity: No Food Insecurity (07/05/2023)   Received from [facility]   Hunger Vital Sign   . Worried About Programme researcher, broadcasting/film/video in the Last Year: Never true   . Ran Out of Food in the Last Year: Never true  Transportation Needs: No Transportation Needs (07/05/2023)   Received from [facility]   [facility] - Transportation   . Lack of Transportation (Medical): No   . Lack of Transportation (Non-Medical): No  Physical Activity: Sufficiently Active (07/05/2023)   Received from [facility]   Exercise Vital Sign   . Days of Exercise per Week: 6 days   . Minutes of Exercise per Session: 30 min  Stress: No Stress Concern Present (07/05/2023)   Received from [facility]ational [facility] - Occupational Stress Questionnaire   . Feeling of Stress : Not at all  Social Connections: Socially Integrated (07/05/2023)   Received  from [facility]   Social Connection and Isolation Panel [NHANES]   . Frequency of Communication with Friends and Family: More than three times a week   . Frequency of Social Gatherings with Friends and Family: More than three times a week   .  Attends Religious Services: More than 4 times per year   . Active Member of Clubs or Organizations: Yes   . Attends Banker Meetings: More than 4 times per year   . Marital Status: Married  Catering manager Violence: Not At Risk (07/05/2023)   Received from [facility]   Humiliation, Afraid, Rape, and Kick questionnaire   . Fear of Current or Ex-Partner: No   . Emotionally Abused: No   . Physically Abused: No   . Sexually Abused: No    Family History: Family History  Problem Relation Age of Onset  . Diabetes Mother   . Hypertension Mother   . Stroke Mother   . Lung cancer Father 29  . Breast cancer Sister 22  . Stroke Sister   . Stroke Sister   . Prostate cancer Maternal Grandfather   . Stroke Maternal Grandfather   . Sickle cell anemia Daughter   . Prostate cancer Cousin   . Prostate cancer Cousin   . Colon cancer Neg Hx   . Seizures Neg Hx     Current Medications:  Current Outpatient Medications:  .  abiraterone acetate (ZYTIGA) 250 MG tablet, Take 4 tablets (1,000 mg total) by mouth daily. Take on an empty stomach 1 hour before or 2 hours after a meal, Disp: 120 tablet, Rfl: 2 .  acetaminophen (TYLENOL) 325 MG tablet, Take 650 mg by mouth daily as needed for mild pain, moderate pain or headache., Disp: , Rfl:  .  atenolol (TENORMIN) 25 MG tablet, TAKE 1 TABLET BY MOUTH EVERY DAY, Disp: 90 tablet, Rfl: 1 .  atorvastatin (LIPITOR) 20 MG tablet, Take 20 mg by mouth daily., Disp: , Rfl:  .  B-D UF III MINI PEN NEEDLES 31G X 5 MM MISC, USE FOUR TIMES DAILY AS DIRECTED, Disp: 150 each, Rfl: 2 .  Calcium Carb-Cholecalciferol 600-500 MG-UNIT CAPS, Take 2 capsules by mouth daily. , Disp: , Rfl:  .  clopidogrel (PLAVIX)  75 MG tablet, TAKE 1 TABLET BY MOUTH EVERY DAY, Disp: 90 tablet, Rfl: 1 .  CONTOUR TEST test strip, TEST TWICE DAILY E11.65, Disp: 200 each, Rfl: 2 .  furosemide (LASIX) 20 MG tablet, Take 20 mg by mouth 3 (three) times a week., Disp: , Rfl:  .  insulin degludec (TRESIBA FLEXTOUCH) 100 UNIT/ML FlexTouch Pen, Inject 20 Units into the skin at bedtime., Disp: 6 mL, Rfl: 0 .  latanoprost (XALATAN) 0.005 % ophthalmic solution, Place 1 drop into both eyes at bedtime., Disp: , Rfl:  .  levETIRAcetam (KEPPRA) 250 MG tablet, Take 1 tablet (250 mg total) by mouth 2 (two) times daily., Disp: 60 tablet, Rfl: 6 .  polyethylene glycol-electrolytes (NULYTELY) 420 g solution, Take 4,000 mLs by mouth as directed., Disp: 4000 mL, Rfl: 0 .  predniSONE (DELTASONE) 5 MG tablet, Take 1 tablet (5 mg total) by mouth daily with breakfast., Disp: 30 tablet, Rfl: 5 .  prochlorperazine (COMPAZINE) 10 MG tablet, Take 1 tablet (10 mg total) by mouth every 6 (six) hours as needed for nausea or vomiting., Disp: 30 tablet, Rfl: 1 .  Semaglutide, 1 MG/DOSE, 4 MG/3ML SOPN, Inject 1 mg as directed once a week., Disp: 6 mL, Rfl: 3 .  Vibegron (GEMTESA) 75 MG TABS, Take 1 tablet (75 mg total) by mouth daily., Disp: 30 tablet, Rfl: 11 .  Vibegron (GEMTESA) 75 MG TABS, Take 1 tablet (75 mg total) by mouth daily., Disp: , Rfl:    Allergies: Allergies  Allergen Reactions  .  Zolpidem Tartrate Other (See Comments)    disorientation     REVIEW OF SYSTEMS:   Review of Systems  Constitutional:  Negative for chills, fatigue and fever.  HENT:   Negative for lump/mass, mouth sores, nosebleeds, sore throat and trouble swallowing.   Eyes:  Negative for eye problems.  Respiratory:  Negative for cough and shortness of breath.   Cardiovascular:  Negative for chest pain, leg swelling and palpitations.  Gastrointestinal:  Negative for abdominal pain, constipation, diarrhea, nausea and vomiting.  Genitourinary:  Positive for hematuria.  Negative for bladder incontinence, difficulty urinating, dysuria, frequency and nocturia.   Musculoskeletal:  Negative for arthralgias, back pain, flank pain, myalgias and neck pain.  Skin:  Negative for itching and rash.  Neurological:  Negative for dizziness, headaches and numbness.  Hematological:  Does not bruise/bleed easily.  Psychiatric/Behavioral:  Negative for depression, sleep disturbance and suicidal ideas. The patient is not nervous/anxious.   All other systems reviewed and are negative.    VITALS:   Blood pressure 111/83, pulse 91, temperature 97.6 F (36.4 C), temperature source Tympanic, resp. rate 18, weight 243 lb (110.2 kg), SpO2 98%.  Wt Readings from Last 3 Encounters:  01/23/24 243 lb (110.2 kg)  12/07/23 246 lb 6.4 oz (111.8 kg)  11/23/23 250 lb (113.4 kg)    Body mass index is 33.89 kg/m.  Performance status (ECOG): 1 - Symptomatic but completely ambulatory  PHYSICAL EXAM:   Physical Exam Vitals and nursing note reviewed. Exam conducted with a chaperone present.  Constitutional:      Appearance: Normal appearance.  Cardiovascular:     Rate and Rhythm: Normal rate and regular rhythm.     Pulses: Normal pulses.     Heart sounds: Normal heart sounds.  Pulmonary:     Effort: Pulmonary effort is normal.     Breath sounds: Normal breath sounds.  Abdominal:     Palpations: Abdomen is soft. There is no hepatomegaly, splenomegaly or mass.     Tenderness: There is no abdominal tenderness.  Musculoskeletal:     Right lower leg: No edema.     Left lower leg: No edema.  Lymphadenopathy:     Cervical: No cervical adenopathy.     Right cervical: No superficial, deep or posterior cervical adenopathy.    Left cervical: No superficial, deep or posterior cervical adenopathy.     Upper Body:     Right upper body: No supraclavicular or axillary adenopathy.     Left upper body: No supraclavicular or axillary adenopathy.  Neurological:     General: No focal deficit  present.     Mental Status: He is alert and oriented to person, place, and time.  Psychiatric:        Mood and Affect: Mood normal.        Behavior: Behavior normal.    LABS:      Latest Ref Rng & Units 01/21/2024   11:18 AM 12/25/2023    9:10 AM 11/21/2023    1:32 PM  CBC  WBC 4.0 - 10.5 K/uL 5.6  6.3  6.7   Hemoglobin 13.0 - 17.0 g/dL 11.9  14.7  82.9   Hematocrit 39.0 - 52.0 % 33.3  33.7  34.3   Platelets 150 - 400 K/uL 184  208  249       Latest Ref Rng & Units 01/21/2024   11:18 AM 12/25/2023    9:10 AM 11/21/2023    1:32 PM  CMP  Glucose 70 - 99  mg/dL 469  629  528   BUN 8 - 23 mg/dL 28  33  27   Creatinine 0.61 - 1.24 mg/dL 4.13  2.44  0.10   Sodium 135 - 145 mmol/L 140  138  140   Potassium 3.5 - 5.1 mmol/L 3.8  3.9  4.0   Chloride 98 - 111 mmol/L 106  105  105   CO2 22 - 32 mmol/L 25  24  25    Calcium 8.9 - 10.3 mg/dL 8.5  8.7  8.7   Total Protein 6.5 - 8.1 g/dL 6.2  6.2  6.7   Total Bilirubin 0.0 - 1.2 mg/dL 0.6  0.8  0.8   Alkaline Phos 38 - 126 U/L 204  123  120   AST 15 - 41 U/L 21  31  24    ALT 0 - 44 U/L 16  20  20       No results found for: "CEA1", "CEA" / No results found for: "CEA1", "CEA" Lab Results  Component Value Date   PSA1 1.8 12/28/2022   No results found for: "UVO536" No results found for: "CAN125"  No results found for: "TOTALPROTELP", "ALBUMINELP", "A1GS", "A2GS", "BETS", "BETA2SER", "GAMS", "MSPIKE", "SPEI" Lab Results  Component Value Date   TIBC 234 (L) 11/21/2023   TIBC 221 (L) 08/21/2023   TIBC 218 (L) 05/15/2023   FERRITIN 329 11/21/2023   FERRITIN 402 (H) 08/21/2023   FERRITIN 475 (H) 05/15/2023   IRONPCTSAT 37 11/21/2023   IRONPCTSAT 36 08/21/2023   IRONPCTSAT 37 05/15/2023   No results found for: "LDH"   STUDIES:   No results found.

## 2024-01-23 NOTE — Patient Instructions (Signed)
 [name]  01/23/2024        Your procedure is scheduled on 01/28/24 Monday .  Report to [facility] at 6:00 A.M.  Call this number if you have problems the morning of surgery:  [phone]  If you experience any cold or flu symptoms such as cough, fever, chills, shortness of breath, etc. between now and your scheduled surgery, please notify us at the above number.   Remember:    Do not eat after midnight.   You may drink clear liquids until 3:30 am .  Clear liquids allowed are:  Water, Juice (No red color; non-citric and without pulp; diabetics please choose diet or no sugar options), Carbonated beverages (diabetics please choose diet or no sugar options), Clear Tea (No creamer, milk, or cream, including half & half and powdered creamer), Black Coffee Only (No creamer, milk or cream, including half & half and powdered creamer), and Clear Sports drink (No red color; diabetics please choose diet or no sugar options)  FOLLOW DIET AND PREP INSTRUCTIONS GIVEN BY YOUR DOCTOR    Take these medicines the morning of surgery with A SIP OF WATER   atenolol (TENORMIN)   levETIRAcetam (KEPPRA)   predniSONE    TRESIBA TAKE 10 UNITS ONLY -HALF YOUR DOSE NIGHT BEFORE PROCEDURE   LAST DOSE OF PLAVIX ON 01/22/24  SEMAGLUTIDE LAST DOSE 01/20/24  IRON LAST DOSE 01/20/24    Do not wear jewelry, make-up or nail polish, including gel polish,  artificial nails, or any other type of covering on natural nails (fingers and  toes).  Do not wear lotions, powders, or perfumes, or deodorant.  Do not shave 48 hours prior to surgery.  Men may shave face and neck.  Do not bring valuables to the hospital.  [facility] is not responsible for any belongings or valuables.  Contacts, dentures or bridgework may not be worn into surgery.  Leave your suitcase in the car.  After surgery it may be brought to your room.  For patients admitted to the hospital, discharge time will be determined by your treatment  team.  Patients discharged the day of surgery will not be allowed to drive home.   Special instructions:  DO NOT SMOKE 24 HOURS PRIOR TO PROCEDURE  Please read over the following fact sheets that you were given. Care and Recovery After Surgery  Colonoscopy, Adult, Care After The following information offers guidance on how to care for yourself after your procedure. Your health care provider may also give you more specific instructions. If you have problems or questions, contact your health care provider. What can I expect after the procedure? After the procedure, it is common to have: A small amount of blood in your stool for 24 hours after the procedure. Some gas. Mild cramping or bloating of your abdomen. Follow these instructions at home: Eating and drinking  Drink enough fluid to keep your urine pale yellow. Follow instructions from your health care provider about eating or drinking restrictions. Resume your normal diet as told by your health care provider. Avoid heavy or fried foods that are hard to digest. Activity Rest as told by your health care provider. Avoid sitting for a long time without moving. Get up to take short walks every 1-2 hours. This is important to improve blood flow and breathing. Ask for help if you feel weak or unsteady. Return to your normal activities as told by your health care provider. Ask your health care provider what activities are  safe for you. Managing cramping and bloating  Try walking around when you have cramps or feel bloated. If directed, apply heat to your abdomen as told by your health care provider. Use the heat source that your health care provider recommends, such as a moist heat pack or a heating pad. Place a towel between your skin and the heat source. Leave the heat on for 20-30 minutes. Remove the heat if your skin turns bright red. This is especially important if you are unable to feel pain, heat, or cold. You have a greater risk of  getting burned. General instructions If you were given a sedative during the procedure, it can affect you for several hours. Do not drive or operate machinery until your health care provider says that it is safe. For the first 24 hours after the procedure: Do not sign important documents. Do not drink alcohol. Do your regular daily activities at a slower pace than normal. Eat soft foods that are easy to digest. Take over-the-counter and prescription medicines only as told by your health care provider. Keep all follow-up visits. This is important. Contact a health care provider if: You have blood in your stool 2-3 days after the procedure. Get help right away if: You have more than a small spotting of blood in your stool. You have large blood clots in your stool. You have swelling of your abdomen. You have nausea or vomiting. You have a fever. You have increasing pain in your abdomen that is not relieved with medicine. These symptoms may be an emergency. Get help right away. Call 911. Do not wait to see if the symptoms will go away. Do not drive yourself to the hospital. Summary After the procedure, it is common to have a small amount of blood in your stool. You may also have mild cramping and bloating of your abdomen. If you were given a sedative during the procedure, it can affect you for several hours. Do not drive or operate machinery until your health care provider says that it is safe. Get help right away if you have a lot of blood in your stool, nausea or vomiting, a fever, or increased pain in your abdomen. This information is not intended to replace advice given to you by your health care provider. Make sure you discuss any questions you have with your health care provider. Document Revised: 11/28/2022 Document Reviewed: 06/08/2021 Elsevier Patient Education  2024 Elsevier Inc[employer]   Monitored Anesthesia Care, Care After The following information offers guidance on how to care for  yourself after your procedure. Your health care provider may also give you more specific instructions. If you have problems or questions, contact your health care provider. What can I expect after the procedure? After the procedure, it is common to have: Tiredness. Little or no memory about what happened during or after the procedure. Impaired judgment when it comes to making decisions. Nausea or vomiting. Some trouble with balance. Follow these instructions at home: For the time period you were told by your health care provider:  Rest. Do not participate in activities where you could fall or become injured. Do not drive or use machinery. Do not drink alcohol. Do not take sleeping pills or medicines that cause drowsiness. Do not make important decisions or sign legal documents. Do not take care of children on your own. Medicines Take over-the-counter and prescription medicines only as told by your health care provider. If you were prescribed antibiotics, take them as told by your health  care provider. Do not stop using the antibiotic even if you start to feel better. Eating and drinking Follow instructions from your health care provider about what you may eat and drink. Drink enough fluid to keep your urine pale yellow. If you vomit: Drink clear fluids slowly and in small amounts as you are able. Clear fluids include water, ice chips, low-calorie sports drinks, and fruit juice that has water added to it (diluted fruit juice). Eat light and bland foods in small amounts as you are able. These foods include bananas, applesauce, rice, lean meats, toast, and crackers. General instructions  Have a responsible adult stay with you for the time you are told. It is important to have someone help care for you until you are awake and alert. If you have sleep apnea, surgery and some medicines can increase your risk for breathing problems. Follow instructions from your health care provider about  wearing your sleep device: When you are sleeping. This includes during daytime naps. While taking prescription pain medicines, sleeping medicines, or medicines that make you drowsy. Do not use any products that contain nicotine or tobacco. These products include cigarettes, chewing tobacco, and vaping devices, such as e-cigarettes. If you need help quitting, ask your health care provider. Contact a health care provider if: You feel nauseous or vomit every time you eat or drink. You feel light-headed. You are still sleepy or having trouble with balance after 24 hours. You get a rash. You have a fever. You have redness or swelling around the IV site. Get help right away if: You have trouble breathing. You have new confusion after you get home. These symptoms may be an emergency. Get help right away. Call 911. Do not wait to see if the symptoms will go away. Do not drive yourself to the hospital. This information is not intended to replace advice given to you by your health care provider. Make sure you discuss any questions you have with your health care provider. Document Revised: 03/13/2022 Document Reviewed: 03/13/2022 Elsevier Patient Education  2024 [employer].

## 2024-01-24 ENCOUNTER — Encounter (HOSPITAL_COMMUNITY)
Admission: RE | Admit: 2024-01-24 | Discharge: 2024-01-24 | Disposition: A | Discharge status: Home / Self Care | Payer: Medicare Other | Source: Ambulatory Visit | Attending: Internal Medicine | Admitting: Internal Medicine

## 2024-01-28 ENCOUNTER — Ambulatory Visit (HOSPITAL_COMMUNITY): Admitting: Anesthesiology

## 2024-01-28 ENCOUNTER — Encounter (HOSPITAL_COMMUNITY): Payer: Self-pay | Admitting: Internal Medicine

## 2024-01-28 ENCOUNTER — Other Ambulatory Visit: Payer: Self-pay

## 2024-01-28 ENCOUNTER — Ambulatory Visit (HOSPITAL_COMMUNITY)
Admission: RE | Admit: 2024-01-28 | Discharge: 2024-01-28 | Disposition: A | Discharge status: Home / Self Care | Payer: Medicare Other | Attending: Internal Medicine | Admitting: Internal Medicine

## 2024-01-28 ENCOUNTER — Encounter (HOSPITAL_COMMUNITY)
Admission: RE | Disposition: A | Discharge status: Home / Self Care | Payer: Self-pay | Source: Home / Self Care | Attending: Internal Medicine

## 2024-01-28 DIAGNOSIS — Z8546 Personal history of malignant neoplasm of prostate: Secondary | ICD-10-CM | POA: Diagnosis not present

## 2024-01-28 DIAGNOSIS — Z1211 Encounter for screening for malignant neoplasm of colon: Secondary | ICD-10-CM | POA: Diagnosis not present

## 2024-01-28 DIAGNOSIS — I252 Old myocardial infarction: Secondary | ICD-10-CM | POA: Diagnosis not present

## 2024-01-28 DIAGNOSIS — Z833 Family history of diabetes mellitus: Secondary | ICD-10-CM | POA: Diagnosis not present

## 2024-01-28 DIAGNOSIS — Z860100 Personal history of colon polyps, unspecified: Secondary | ICD-10-CM | POA: Diagnosis not present

## 2024-01-28 DIAGNOSIS — Z7902 Long term (current) use of antithrombotics/antiplatelets: Secondary | ICD-10-CM | POA: Insufficient documentation

## 2024-01-28 DIAGNOSIS — I10 Essential (primary) hypertension: Secondary | ICD-10-CM

## 2024-01-28 DIAGNOSIS — Z7952 Long term (current) use of systemic steroids: Secondary | ICD-10-CM | POA: Diagnosis not present

## 2024-01-28 DIAGNOSIS — Z794 Long term (current) use of insulin: Secondary | ICD-10-CM | POA: Insufficient documentation

## 2024-01-28 DIAGNOSIS — D631 Anemia in chronic kidney disease: Secondary | ICD-10-CM | POA: Insufficient documentation

## 2024-01-28 DIAGNOSIS — Z860101 Personal history of adenomatous and serrated colon polyps: Secondary | ICD-10-CM

## 2024-01-28 DIAGNOSIS — I129 Hypertensive chronic kidney disease with stage 1 through stage 4 chronic kidney disease, or unspecified chronic kidney disease: Secondary | ICD-10-CM | POA: Diagnosis not present

## 2024-01-28 DIAGNOSIS — Z09 Encounter for follow-up examination after completed treatment for conditions other than malignant neoplasm: Secondary | ICD-10-CM | POA: Diagnosis not present

## 2024-01-28 DIAGNOSIS — N182 Chronic kidney disease, stage 2 (mild): Secondary | ICD-10-CM | POA: Insufficient documentation

## 2024-01-28 DIAGNOSIS — K5520 Angiodysplasia of colon without hemorrhage: Secondary | ICD-10-CM | POA: Diagnosis not present

## 2024-01-28 DIAGNOSIS — M1990 Unspecified osteoarthritis, unspecified site: Secondary | ICD-10-CM | POA: Insufficient documentation

## 2024-01-28 DIAGNOSIS — E1151 Type 2 diabetes mellitus with diabetic peripheral angiopathy without gangrene: Secondary | ICD-10-CM | POA: Diagnosis not present

## 2024-01-28 DIAGNOSIS — D121 Benign neoplasm of appendix: Secondary | ICD-10-CM | POA: Insufficient documentation

## 2024-01-28 DIAGNOSIS — K5521 Angiodysplasia of colon with hemorrhage: Secondary | ICD-10-CM | POA: Diagnosis not present

## 2024-01-28 DIAGNOSIS — D122 Benign neoplasm of ascending colon: Secondary | ICD-10-CM

## 2024-01-28 DIAGNOSIS — D124 Benign neoplasm of descending colon: Secondary | ICD-10-CM | POA: Insufficient documentation

## 2024-01-28 DIAGNOSIS — Z7985 Long-term (current) use of injectable non-insulin antidiabetic drugs: Secondary | ICD-10-CM | POA: Insufficient documentation

## 2024-01-28 DIAGNOSIS — Z79899 Other long term (current) drug therapy: Secondary | ICD-10-CM | POA: Insufficient documentation

## 2024-01-28 DIAGNOSIS — E1122 Type 2 diabetes mellitus with diabetic chronic kidney disease: Secondary | ICD-10-CM | POA: Insufficient documentation

## 2024-01-28 DIAGNOSIS — Z7984 Long term (current) use of oral hypoglycemic drugs: Secondary | ICD-10-CM | POA: Diagnosis not present

## 2024-01-28 DIAGNOSIS — G4730 Sleep apnea, unspecified: Secondary | ICD-10-CM | POA: Diagnosis not present

## 2024-01-28 HISTORY — PX: COLONOSCOPY WITH PROPOFOL: SHX5780

## 2024-01-28 HISTORY — PX: POLYPECTOMY: SHX5525

## 2024-01-28 LAB — GLUCOSE, CAPILLARY: Glucose-Capillary: 81 mg/dL (ref 70–99)

## 2024-01-28 SURGERY — COLONOSCOPY WITH PROPOFOL
Anesthesia: General

## 2024-01-28 MED ORDER — LACTATED RINGERS IV SOLN: INTRAVENOUS | Status: DC | PRN

## 2024-01-28 MED ORDER — LIDOCAINE HCL (CARDIAC) PF 100 MG/5ML IV SOSY
PREFILLED_SYRINGE | INTRAVENOUS | Status: DC | PRN
Start: 2024-01-28 — End: 2024-01-28
  Administered 2024-01-28: 100 mg via INTRATRACHEAL

## 2024-01-28 MED ORDER — PROPOFOL 500 MG/50ML IV EMUL
INTRAVENOUS | Status: DC | PRN
  Administered 2024-01-28: 150 ug/kg/min via INTRAVENOUS
  Administered 2024-01-28: 60 ug/kg/min via INTRAVENOUS

## 2024-01-28 MED ORDER — GLYCOPYRROLATE PF 0.2 MG/ML IJ SOSY
PREFILLED_SYRINGE | INTRAMUSCULAR | Status: DC | PRN
  Administered 2024-01-28: .2 mg via INTRAVENOUS

## 2024-01-28 NOTE — Anesthesia Postprocedure Evaluation (Signed)
 Anesthesia Post Note  Patient: [name]  Procedure(s) Performed: COLONOSCOPY WITH PROPOFOL POLYPECTOMY  Anesthesia Type: General Anesthetic complications: no   There were no known notable events for this encounter.   Last Vitals:  Vitals:   01/28/24 0645 01/28/24 0811  BP: 123/80 99/72  Pulse: 86 93  Resp: 12 (!) 22  Temp:  36.6 C  SpO2: 100% 100%    Last Pain:  Vitals:   01/28/24 0811  TempSrc: Oral  PainSc: 0-No pain                 Umeka Wrench L Minard Millirons

## 2024-01-28 NOTE — Transfer of Care (Signed)
 Immediate Anesthesia Transfer of Care Note  Patient: [name]  Procedure(s) Performed: COLONOSCOPY WITH PROPOFOL POLYPECTOMY  Patient Location: Endoscopy Unit  Anesthesia Type:General  Level of Consciousness: awake, alert , and oriented  Airway & Oxygen Therapy: Patient Spontanous Breathing  Post-op Assessment: Report given to RN and Post -op Vital signs reviewed and stable  Post vital signs: Reviewed and stable  Last Vitals:  Vitals Value Taken Time  BP    Temp    Pulse    Resp    SpO2      Last Pain:  Vitals:   01/28/24 0635  PainSc: 0-No pain         Complications: No notable events documented.

## 2024-01-28 NOTE — H&P (Signed)
 Primary Care Physician:  Benita Stabile, MD Primary Gastroenterologist:  Dr. Marletta Lor  Pre-Procedure History & Physical: HPI:  [name] is a 73 y.o. male is here for a colonoscopy to be performed for surveillance purposes, personal history of adenomatous colon polyps in 2020  Past Medical History:  Diagnosis Date   Arthritis    Cerebrovascular disease    MRI shows Right carotid inferior cavernours narrowing 75% and  50-75% stenosis of Cavernous and supraclinoi right side   CKD (chronic kidney disease) 06/28/2014   Sees Dr Lowell Guitar   Diabetic retinopathy [facility])    Hyperlipidemia    Hypertension    Myocardial infarction [facility])    "mild" heart attack   Necrosis (HCC)    #2 nail    Pneumonia    as a child   Poor circulation of extremity    Prostate cancer (HCC) 2017   Prostate   Sleep apnea    uses cpap, getting a new one   Type 2 Diabetes mellitus    Type 2    Past Surgical History:  Procedure Laterality Date   ABOVE KNEE LEG AMPUTATION Left 2004   started out below knee and then extended to above knee due to poor healing   AMPUTATION Right 04/28/2015   Procedure: AMPUTATION RAY, RIGHT 5TH TOE;  Surgeon: Eldred Manges, MD;  Location: [facility] OR;  Service: Orthopedics;  Laterality: Right;   AMPUTATION Right 05/10/2015   Procedure: Right Below Knee Amputation;  Surgeon: Eldred Manges, MD;  Location: [facility] OR;  Service: Orthopedics;  Laterality: Right;   CATARACT EXTRACTION W/PHACO  09/05/2012   Procedure: CATARACT EXTRACTION PHACO AND INTRAOCULAR LENS PLACEMENT (IOC);  Surgeon: Gemma Payor, MD;  Location: [facility] ORS;  Service: Ophthalmology;  Laterality: Left;  CDE=5.45   CATARACT EXTRACTION W/PHACO  10/03/2012   Procedure: CATARACT EXTRACTION PHACO AND INTRAOCULAR LENS PLACEMENT (IOC);  Surgeon: Gemma Payor, MD;  Location: [facility] ORS;  Service: Ophthalmology;  Laterality: Right;  CDE: 12.31   COLONOSCOPY  2011   Dr. Arlyce Dice: colon polyps, tubular adenoma   COLONOSCOPY N/A 11/01/2018   Dr. Jena Gauss: Blood  noted in the rectal vault.  Vascular pattern of the rectum abnormal, neovascular changes distally and actively oozing.  There were 3 2 to 5 mm polyps in the splenic flexure, cecum removed.  Cecal polyp was not recovered.  Radiation proctitis status post APC treatment.  The splenic flexure polyps were tubular adenomas.  Next colonoscopy in 5 years.   FLEXIBLE SIGMOIDOSCOPY N/A 10/27/2019   Procedure: FLEXIBLE SIGMOIDOSCOPY;  Surgeon: West Bali, MD; rectal bleeding due to radiation proctitis s/p APC therapy (actively bleeding during exam), rectosigmoid colon and sigmoid colon appeared normal.   FLEXIBLE SIGMOIDOSCOPY N/A 03/13/2022   Procedure: FLEXIBLE SIGMOIDOSCOPY;  Surgeon: Lanelle Bal, DO;  Location: [facility] ENDO SUITE;  Service: Endoscopy;  Laterality: N/A;  1:45pm   HOT HEMOSTASIS  03/13/2022   Procedure: HOT HEMOSTASIS (ARGON PLASMA COAGULATION/BICAP);  Surgeon: Lanelle Bal, DO;  Location: [facility] ENDO SUITE;  Service: Endoscopy;;   POLYPECTOMY  11/01/2018   Procedure: POLYPECTOMY;  Surgeon: Corbin Ade, MD;  Location: [facility] ENDO SUITE;  Service: Endoscopy;;  cecum,splenic flexure   REFRACTIVE SURGERY Left 08/01/2021   Cleaned cataract    Prior to Admission medications   Medication Sig Start Date End Date Taking? Authorizing Provider  abiraterone acetate (ZYTIGA) 250 MG tablet Take 4 tablets (1,000 mg total) by mouth daily. Take on an empty stomach 1 hour before or 2  hours after a meal 11/29/23  Yes Doreatha Massed, MD  acetaminophen (TYLENOL) 325 MG tablet Take 650 mg by mouth daily as needed for mild pain, moderate pain or headache.   Yes [provider]  atenolol (TENORMIN) 25 MG tablet TAKE 1 TABLET BY MOUTH EVERY DAY 11/09/20  Yes , Velna Hatchet, MD  furosemide (LASIX) 20 MG tablet Take 20 mg by mouth 3 (three) times a week. 07/06/20  Yes [provider]  insulin degludec (TRESIBA FLEXTOUCH) 100 UNIT/ML FlexTouch Pen Inject 20 Units into the skin at bedtime.  12/04/23  Yes Dani Gobble, NP  levETIRAcetam (KEPPRA) 250 MG tablet Take 1 tablet (250 mg total) by mouth 2 (two) times daily. 11/23/23  Yes Windell Norfolk, MD  predniSONE (DELTASONE) 5 MG tablet Take 1 tablet (5 mg total) by mouth daily with breakfast. 10/11/23  Yes Doreatha Massed, MD  Vibegron (GEMTESA) 75 MG TABS Take 1 tablet (75 mg total) by mouth daily. 04/05/23  Yes Bjorn Pippin, MD  Vibegron (GEMTESA) 75 MG TABS Take 1 tablet (75 mg total) by mouth daily. 12/06/23  Yes Bjorn Pippin, MD  atorvastatin (LIPITOR) 20 MG tablet Take 20 mg by mouth daily. 01/11/24   [provider]  B-D UF III MINI PEN NEEDLES 31G X 5 MM MISC USE FOUR TIMES DAILY AS DIRECTED 09/16/18   Roma Kayser, MD  Calcium Carb-Cholecalciferol 600-500 MG-UNIT CAPS Take 2 capsules by mouth daily.     [provider]  clopidogrel (PLAVIX) 75 MG tablet TAKE 1 TABLET BY MOUTH EVERY DAY 11/09/20   Salley Scarlet, MD  CONTOUR TEST test strip TEST TWICE DAILY E11.65 06/18/18   Roma Kayser, MD  latanoprost (XALATAN) 0.005 % ophthalmic solution Place 1 drop into both eyes at bedtime. 01/20/22   [provider]  polyethylene glycol-electrolytes (NULYTELY) 420 g solution Take 4,000 mLs by mouth as directed. 12/24/23   Lanelle Bal, DO  prochlorperazine (COMPAZINE) 10 MG tablet Take 1 tablet (10 mg total) by mouth every 6 (six) hours as needed for nausea or vomiting. 01/04/23   Doreatha Massed, MD  Semaglutide, 1 MG/DOSE, 4 MG/3ML SOPN Inject 1 mg as directed once a week. 08/15/22   Dani Gobble, NP    Allergies as of 12/24/2023 - Review Complete 12/07/2023  Allergen Reaction Noted   Zolpidem tartrate Other (See Comments) 04/11/2011    Family History  Problem Relation Age of Onset   Diabetes Mother    Hypertension Mother    Stroke Mother    Lung cancer Father 85   Breast cancer Sister 63   Stroke Sister    Stroke Sister    Prostate cancer Maternal Grandfather     Stroke Maternal Grandfather    Sickle cell anemia Daughter    Prostate cancer Cousin    Prostate cancer Cousin    Colon cancer Neg Hx    Seizures Neg Hx     Social History   Socioeconomic History   Marital status: Married    Spouse name: Not on file   Number of children: 2   Years of education: college   Highest education level: Not on file  Occupational History   Occupation: Engineer, materials: [employer]  Tobacco Use   Smoking status: Never    Passive exposure: Never   Smokeless tobacco: Never  Vaping Use   Vaping status: Never Used  Substance and Sexual Activity   Alcohol use: No   Drug  use: No   Sexual activity: Not Currently  Other Topics Concern   Not on file  Social History Narrative   Patient lives with his wife Patient right handedPatient drinks caffine on occ.   Pt retired    Teacher, early years/pre Strain: Low Risk  (07/05/2023)   Received [facility]   Overall Financial Resource Strain (CARDIA)    Difficulty of Paying Living Expenses: Not hard at all  Food Insecurity: No Food Insecurity (07/05/2023)   Received from [facility]   Hunger Vital Sign    Worried About Running Out of Food in the Last Year: Never true    Ran Out of Food in the Last Year: Never true  Transportation Needs: No Transportation Needs (07/05/2023)   Received from [facility] - Transportation    Lack of Transportation (Medical): No    Lack of Transportation (Non-Medical): No  Physical Activity: Sufficiently Active (07/05/2023)   Received from [facility]   Exercise Vital Sign    Days of Exercise per Week: 6 days    Minutes of Exercise per Session: 30 min  Stress: No Stress Concern Present (07/05/2023)   Received from [facility] - Occupational Stress Questionnaire    Feeling of Stress : Not at all  Social Connections: Socially Integrated (07/05/2023)   Received from [facility]   Social Connection and Isolation Panel [NHANES]    Frequency of Communication with Friends and Family: More than three times a week    Frequency of Social Gatherings with Friends and Family: More than three times a week    Attends Religious Services: More than 4 times per year    Active Member of Clubs or Organizations: Yes    Attends Banker Meetings: More than 4 times per year    Marital Status: Married  Catering manager Violence: Not At Risk (07/05/2023)   Received from [facility]   Humiliation, Afraid, Rape, and Kick questionnaire    Fear of Current or Ex-Partner: No    Emotionally Abused: No    Physically Abused: No    Sexually Abused: No    Review of Systems: See HPI, otherwise negative ROS  Physical Exam: Vital signs in last 24 hours: Temp:  [98.3 F (36.8 C)] 98.3 F (36.8 C) (03/31 0635) Pulse Rate:  [86] 86 (03/31 0645) Resp:  [12] 12 (03/31 0645) BP: (123)/(80) 123/80 (03/31 0645) SpO2:  [100 %] 100 % (03/31 0645) Weight:  [110.2 kg] 110.2 kg (03/31 0635)   General:   Alert,  Well-developed, well-nourished, pleasant and cooperative in NAD Head:  Normocephalic and atraumatic. Eyes:  Sclera clear, no icterus.   Conjunctiva pink. Ears:  Normal auditory acuity. Nose:  No deformity, discharge,  or lesions. Msk:  Symmetrical without gross deformities. Normal posture. Extremities:  Without clubbing or edema. Neurologic:  Alert and  oriented x4;  grossly normal neurologically. Skin:  Intact without significant lesions or rashes. Psych:  Alert and cooperative. Normal mood and affect.  Impression/Plan: [name] is here for a colonoscopy to be performed for surveillance purposes, personal history of adenomatous colon polyps in 2020  The risks of the procedure including infection, bleed, or perforation as well as benefits, limitations, alternatives and imponderables have been reviewed with the patient. Questions have been answered. All  parties agreeable.

## 2024-01-28 NOTE — Discharge Instructions (Addendum)
  Colonoscopy Discharge Instructions  Read the instructions outlined below and refer to this sheet in the next few weeks. These discharge instructions provide you with general information on caring for yourself after you leave the hospital. Your doctor may also give you specific instructions. While your treatment has been planned according to the most current medical practices available, unavoidable complications occasionally occur.   ACTIVITY You may resume your regular activity, but move at a slower pace for the next 24 hours.  Take frequent rest periods for the next 24 hours.  Walking will help get rid of the air and reduce the bloated feeling in your belly (abdomen).  No driving for 24 hours (because of the medicine (anesthesia) used during the test).   Do not sign any important legal documents or operate any machinery for 24 hours (because of the anesthesia used during the test).  NUTRITION Drink plenty of fluids.  You may resume your normal diet as instructed by your doctor.  Begin with a light meal and progress to your normal diet. Heavy or fried foods are harder to digest and may make you feel sick to your stomach (nauseated).  Avoid alcoholic beverages for 24 hours or as instructed.  MEDICATIONS You may resume your normal medications unless your doctor tells you otherwise.  WHAT YOU CAN EXPECT TODAY Some feelings of bloating in the abdomen.  Passage of more gas than usual.  Spotting of blood in your stool or on the toilet paper.  IF YOU HAD POLYPS REMOVED DURING THE COLONOSCOPY: No aspirin products for 7 days or as instructed.  No alcohol for 7 days or as instructed.  Eat a soft diet for the next 24 hours.  FINDING OUT THE RESULTS OF YOUR TEST Not all test results are available during your visit. If your test results are not back during the visit, make an appointment with your caregiver to find out the results. Do not assume everything is normal if you have not heard from your  caregiver or the medical facility. It is important for you to follow up on all of your test results.  SEEK IMMEDIATE MEDICAL ATTENTION IF: You have more than a spotting of blood in your stool.  Your belly is swollen (abdominal distention).  You are nauseated or vomiting.  You have a temperature over 101.  You have abdominal pain or discomfort that is severe or gets worse throughout the day.   Your colonoscopy revealed 3 polyp(s) which I removed successfully. Await pathology results, my office will contact you. I recommend repeating colonoscopy in 5 years for surveillance purposes.   Multiple AVMs again noted in your rectum and sigmoid colon.  1 was actively oozing blood.  I treated this with coagulation today.  Also treated multiple others to hopefully prevent rebleeding in the future.  Follow-up in GI in 3 to 4 months.  I hope you have a great rest of your week!  Hennie Duos. Marletta Lor, D.O. Gastroenterology and Hepatology [facility]

## 2024-01-28 NOTE — Anesthesia Preprocedure Evaluation (Addendum)
 Anesthesia Evaluation  Patient identified by MRN, date of birth, ID band Patient awake    Reviewed: Allergy & Precautions, NPO status , Patient's Chart, lab work & pertinent test results  Airway Mallampati: II  TM Distance: >3 FB Neck ROM: Full    Dental  (+) Dental Advisory Given, Edentulous Upper, Missing   Pulmonary sleep apnea and Continuous Positive Airway Pressure Ventilation , pneumonia   Pulmonary exam normal breath sounds clear to auscultation       Cardiovascular Exercise Tolerance: Good hypertension, Pt. on home beta blockers and Pt. on medications + Past MI and + Peripheral Vascular Disease  Normal cardiovascular exam Rhythm:Regular Rate:Normal     Neuro/Psych negative neurological ROS  negative psych ROS   GI/Hepatic negative GI ROS, Neg liver ROS,,,  Endo/Other  diabetes, Well Controlled, Type 2, Insulin Dependent, Oral Hypoglycemic Agents    Renal/GU Renal InsufficiencyRenal diseaseStage 2 CKD   Prostate cancer    Musculoskeletal  (+) Arthritis , Osteoarthritis,    Abdominal   Peds negative pediatric ROS (+)  Hematology  (+) Blood dyscrasia, anemia   Anesthesia Other Findings Left  AKA, right BKA  Reproductive/Obstetrics negative OB ROS                             Anesthesia Physical Anesthesia Plan  ASA: 3  Anesthesia Plan: General   Post-op Pain Management: Minimal or no pain anticipated   Induction: Intravenous  PONV Risk Score and Plan: Propofol infusion  Airway Management Planned: Nasal Cannula and Natural Airway  Additional Equipment:   Intra-op Plan:   Post-operative Plan:   Informed Consent: I have reviewed the patients History and Physical, chart, labs and discussed the procedure including the risks, benefits and alternatives for the proposed anesthesia with the patient or authorized representative who has indicated his/her understanding and  acceptance.     Dental advisory given  Plan Discussed with: CRNA and Surgeon  Anesthesia Plan Comments:         Anesthesia Quick Evaluation

## 2024-01-28 NOTE — Op Note (Signed)
 [facility] Patient Name: [name] Procedure Date: 01/28/2024 7:01 AM MRN: [id number] Date of Birth: April 30, 1951 Attending MD: Hennie Duos. Marletta Lor , [place], [id number] CSN: [id number] Age: 73 Admit Type: Outpatient Procedure:                Colonoscopy Indications:              Surveillance: Personal history of adenomatous                            polyps on last colonoscopy 5 years ago Providers:                Hennie Duos. Marletta Lor, DO, Tammy Vaught, RN, Dyann Ruddle Referring MD:              Medicines:                See the Anesthesia note for documentation of the                            administered medications Complications:            No immediate complications. Estimated Blood Loss:     Estimated blood loss was minimal. Procedure:                Pre-Anesthesia Assessment:                           - The anesthesia plan was to use monitored                            anesthesia care (MAC).                           After obtaining informed consent, the colonoscope                            was passed under direct vision. Throughout the                            procedure, the patient's blood pressure, pulse, and                            oxygen saturations were monitored continuously. The                            [id number]-HQ190L ([id number]) scope was introduced through                            the anus and advanced to the the cecum, identified                            by appendiceal orifice and ileocecal valve. The                            colonoscopy was performed without difficulty.  The                            patient tolerated the procedure well. The quality                            of the bowel preparation was evaluated using the                            BBPS [facility] Bowel Preparation Scale) with scores                            of: Right Colon = 3, Transverse Colon = 3 and Left                            Colon = 3 (entire mucosa seen  well with no residual                            staining, small fragments of stool or opaque                            liquid). The total BBPS score equals 9. Scope In: 7:37:04 AM Scope Out: 8:01:14 AM Scope Withdrawal Time: 0 hours 22 minutes 4 seconds  Total Procedure Duration: 0 hours 24 minutes 10 seconds  Findings:      A 6 mm polyp was found in the appendiceal orifice. The polyp was       sessile. The polyp was removed with a cold snare. Resection and       retrieval were complete.      A 4 mm polyp was found in the ascending colon. The polyp was sessile.       The polyp was removed with a cold snare. Resection and retrieval were       complete.      A 5 mm polyp was found in the descending colon. The polyp was sessile.       The polyp was removed with a cold snare. Resection and retrieval were       complete.      A single small angiodysplastic lesion with bleeding was found in the       rectum. Coagulation for hemostasis using argon plasma was successful.      Multiple small angiodysplastic lesions without bleeding were found in       the rectum and in the sigmoid colon. Coagulation for bleeding prevention       using argon plasma was successful. Impression:               - One 6 mm polyp at the appendiceal orifice,                            removed with a cold snare. Resected and retrieved.                           - One 4 mm polyp in the ascending colon, removed  with a cold snare. Resected and retrieved.                           - One 5 mm polyp in the descending colon, removed                            with a cold snare. Resected and retrieved.                           - A single bleeding colonic angiodysplastic lesion.                            Treated with argon plasma coagulation (APC).                           - Multiple non-bleeding colonic angiodysplastic                            lesions. Treated with argon plasma coagulation                             (APC). Moderate Sedation:      Per Anesthesia Care Recommendation:           - Patient has a contact number available for                            emergencies. The signs and symptoms of potential                            delayed complications were discussed with the                            patient. Return to normal activities tomorrow.                            Written discharge instructions were provided to the                            patient.                           - Resume previous diet.                           - Continue present medications.                           - Await pathology results.                           - Repeat colonoscopy in 5 years for surveillance.                           - Return to GI clinic in 4 months. Procedure Code(s):        --- Professional ---  09811, 59, Colonoscopy, flexible; with control of                            bleeding, any method                           45385, Colonoscopy, flexible; with removal of                            tumor(s), polyp(s), or other lesion(s) by snare                            technique Diagnosis Code(s):        --- Professional ---                           Z86.010, Personal history of colonic polyps                           D12.1, Benign neoplasm of appendix                           D12.2, Benign neoplasm of ascending colon                           D12.4, Benign neoplasm of descending colon                           K55.21, Angiodysplasia of colon with hemorrhage CPT copyright 2022 [employer]. All rights reserved. The codes documented in this report are preliminary and upon coder review may  be revised to meet current compliance requirements. Hennie Duos. Marletta Lor, DO Hennie Duos. Marletta Lor, DO 01/28/2024 8:08:30 AM This report has been signed electronically. Number of Addenda: 0

## 2024-01-29 ENCOUNTER — Encounter (HOSPITAL_COMMUNITY): Payer: Self-pay | Admitting: Internal Medicine

## 2024-01-29 LAB — SURGICAL PATHOLOGY

## 2024-01-30 ENCOUNTER — Other Ambulatory Visit: Payer: Self-pay

## 2024-01-30 DIAGNOSIS — H40053 Ocular hypertension, bilateral: Secondary | ICD-10-CM | POA: Diagnosis not present

## 2024-02-11 ENCOUNTER — Other Ambulatory Visit: Payer: Self-pay

## 2024-02-11 ENCOUNTER — Other Ambulatory Visit (HOSPITAL_COMMUNITY): Payer: Self-pay

## 2024-02-11 ENCOUNTER — Other Ambulatory Visit: Payer: Self-pay | Admitting: Pharmacy Technician

## 2024-02-11 NOTE — Progress Notes (Signed)
 Specialty Pharmacy Refill Coordination Note  [name] is a 73 y.o. male contacted today regarding refills of specialty medication(s) Abiraterone Acetate (ZYTIGA)   Patient requested Delivery   Delivery date: 02/11/24   Verified address: [address]  [name] [address]   Medication will be filled on 02/11/24.

## 2024-02-18 ENCOUNTER — Other Ambulatory Visit: Payer: Self-pay | Admitting: Nurse Practitioner

## 2024-02-20 ENCOUNTER — Other Ambulatory Visit: Payer: Self-pay

## 2024-02-28 ENCOUNTER — Telehealth: Payer: Self-pay | Admitting: Urology

## 2024-02-28 NOTE — Telephone Encounter (Signed)
 [name] is going to put samples at front desk per secure message  .    I called patient back and let him know to pick up this afternoon.

## 2024-02-28 NOTE — Telephone Encounter (Signed)
 Samples upfront and dated just FYI

## 2024-02-28 NOTE — Telephone Encounter (Signed)
 Patient calling the office for samples of medication:   1.  What medication and dosage are you requesting samples for? GEMTESA   2.  Are you currently out of this medication? YES

## 2024-03-03 ENCOUNTER — Other Ambulatory Visit: Payer: Self-pay

## 2024-03-03 ENCOUNTER — Other Ambulatory Visit: Payer: Self-pay | Admitting: Hematology

## 2024-03-03 ENCOUNTER — Other Ambulatory Visit (HOSPITAL_COMMUNITY): Payer: Self-pay

## 2024-03-03 DIAGNOSIS — C61 Malignant neoplasm of prostate: Secondary | ICD-10-CM

## 2024-03-03 MED ORDER — ABIRATERONE ACETATE 250 MG PO TABS
1000.0000 mg | ORAL_TABLET | Freq: Every day | ORAL | 2 refills | Status: DC
Start: 2024-03-03 — End: 2024-05-26
  Filled 2024-03-03: qty 120, 30d supply, fill #0
  Filled 2024-04-11: qty 120, 30d supply, fill #1
  Filled 2024-04-29 – 2024-05-05 (×2): qty 120, 30d supply, fill #2

## 2024-03-03 NOTE — Progress Notes (Signed)
 Specialty Pharmacy Ongoing Clinical Assessment Note  [name] is a 73 y.o. male who is being followed by the specialty pharmacy service for RxSp Oncology   Patient's specialty medication(s) reviewed today: Abiraterone  Acetate (ZYTIGA )   Missed doses in the last 4 weeks: 0   Patient/Caregiver did not have any additional questions or concerns.   Therapeutic benefit summary: Patient is achieving benefit   Adverse events/side effects summary: No adverse events/side effects   Patient's therapy is appropriate to: Continue    Goals Addressed             This Visit's Progress    Slow Disease Progression       Patient is on track. Patient will maintain adherence. PSA 0.01 from 01/21/24 labs.           Follow up:  3 months  Imajean Mcdermid M Tarris [place]bene Specialty Pharmacist

## 2024-03-03 NOTE — Progress Notes (Signed)
 Specialty Pharmacy Refill Coordination Note  [name] is a 73 y.o. male contacted today regarding refills of specialty medication(s) Abiraterone  Acetate (ZYTIGA )   Patient requested Delivery   Delivery date: 03/10/24   Verified address: [address]  [name] [address]   Medication will be filled on 03/07/24.

## 2024-03-07 ENCOUNTER — Other Ambulatory Visit: Payer: Self-pay

## 2024-03-11 ENCOUNTER — Inpatient Hospital Stay

## 2024-03-12 ENCOUNTER — Telehealth: Payer: Self-pay | Admitting: Urology

## 2024-03-12 NOTE — Telephone Encounter (Signed)
 Samples provided at front desk

## 2024-03-12 NOTE — Telephone Encounter (Signed)
 1.  What medication and dosage are you requesting samples for? Gemtesa   2.  Are you currently out of this medication?  Has a few left

## 2024-03-13 ENCOUNTER — Inpatient Hospital Stay: Attending: Hematology

## 2024-03-13 DIAGNOSIS — Z79899 Other long term (current) drug therapy: Secondary | ICD-10-CM | POA: Diagnosis not present

## 2024-03-13 DIAGNOSIS — D509 Iron deficiency anemia, unspecified: Secondary | ICD-10-CM

## 2024-03-13 DIAGNOSIS — Z923 Personal history of irradiation: Secondary | ICD-10-CM | POA: Insufficient documentation

## 2024-03-13 DIAGNOSIS — D631 Anemia in chronic kidney disease: Secondary | ICD-10-CM | POA: Diagnosis not present

## 2024-03-13 DIAGNOSIS — I129 Hypertensive chronic kidney disease with stage 1 through stage 4 chronic kidney disease, or unspecified chronic kidney disease: Secondary | ICD-10-CM | POA: Diagnosis not present

## 2024-03-13 DIAGNOSIS — N189 Chronic kidney disease, unspecified: Secondary | ICD-10-CM | POA: Insufficient documentation

## 2024-03-13 DIAGNOSIS — C61 Malignant neoplasm of prostate: Secondary | ICD-10-CM | POA: Diagnosis not present

## 2024-03-13 LAB — IRON AND TIBC
Iron: 84 ug/dL (ref 45–182)
Saturation Ratios: 36 % (ref 17.9–39.5)
TIBC: 236 ug/dL — ABNORMAL LOW (ref 250–450)
UIBC: 152 ug/dL

## 2024-03-13 LAB — CBC WITH DIFFERENTIAL/PLATELET
Abs Immature Granulocytes: 0.02 10*3/uL (ref 0.00–0.07)
Basophils Absolute: 0 10*3/uL (ref 0.0–0.1)
Basophils Relative: 1 %
Eosinophils Absolute: 0.1 10*3/uL (ref 0.0–0.5)
Eosinophils Relative: 2 %
HCT: 35.8 % — ABNORMAL LOW (ref 39.0–52.0)
Hemoglobin: 11.8 g/dL — ABNORMAL LOW (ref 13.0–17.0)
Immature Granulocytes: 0 %
Lymphocytes Relative: 32 %
Lymphs Abs: 1.7 10*3/uL (ref 0.7–4.0)
MCH: 31.4 pg (ref 26.0–34.0)
MCHC: 33 g/dL (ref 30.0–36.0)
MCV: 95.2 fL (ref 80.0–100.0)
Monocytes Absolute: 0.4 10*3/uL (ref 0.1–1.0)
Monocytes Relative: 8 %
Neutro Abs: 3.1 10*3/uL (ref 1.7–7.7)
Neutrophils Relative %: 57 %
Platelets: 215 10*3/uL (ref 150–400)
RBC: 3.76 MIL/uL — ABNORMAL LOW (ref 4.22–5.81)
RDW: 13.5 % (ref 11.5–15.5)
WBC: 5.3 10*3/uL (ref 4.0–10.5)
nRBC: 0 % (ref 0.0–0.2)

## 2024-03-13 LAB — PSA: Prostatic Specific Antigen: 0.01 ng/mL (ref 0.00–4.00)

## 2024-03-13 LAB — COMPREHENSIVE METABOLIC PANEL WITH GFR
ALT: 15 U/L (ref 0–44)
AST: 21 U/L (ref 15–41)
Albumin: 2.6 g/dL — ABNORMAL LOW (ref 3.5–5.0)
Alkaline Phosphatase: 149 U/L — ABNORMAL HIGH (ref 38–126)
Anion gap: 9 (ref 5–15)
BUN: 23 mg/dL (ref 8–23)
CO2: 24 mmol/L (ref 22–32)
Calcium: 8.7 mg/dL — ABNORMAL LOW (ref 8.9–10.3)
Chloride: 106 mmol/L (ref 98–111)
Creatinine, Ser: 1.54 mg/dL — ABNORMAL HIGH (ref 0.61–1.24)
GFR, Estimated: 48 mL/min — ABNORMAL LOW (ref >60)
Glucose, Bld: 149 mg/dL — ABNORMAL HIGH (ref 70–99)
Potassium: 3.9 mmol/L (ref 3.5–5.1)
Sodium: 139 mmol/L (ref 135–145)
Total Bilirubin: 0.9 mg/dL (ref 0.0–1.2)
Total Protein: 6.5 g/dL (ref 6.5–8.1)

## 2024-03-13 LAB — FERRITIN: Ferritin: 231 ng/mL (ref 24–336)

## 2024-03-18 ENCOUNTER — Inpatient Hospital Stay (HOSPITAL_BASED_OUTPATIENT_CLINIC_OR_DEPARTMENT_OTHER): Admitting: Hematology

## 2024-03-18 ENCOUNTER — Other Ambulatory Visit: Payer: Self-pay

## 2024-03-18 VITALS — BP 116/71 | HR 81 | Temp 98.2°F | Resp 20 | Wt 243.0 lb

## 2024-03-18 DIAGNOSIS — Z09 Encounter for follow-up examination after completed treatment for conditions other than malignant neoplasm: Secondary | ICD-10-CM

## 2024-03-18 DIAGNOSIS — Z923 Personal history of irradiation: Secondary | ICD-10-CM | POA: Diagnosis not present

## 2024-03-18 DIAGNOSIS — C61 Malignant neoplasm of prostate: Secondary | ICD-10-CM

## 2024-03-18 DIAGNOSIS — M810 Age-related osteoporosis without current pathological fracture: Secondary | ICD-10-CM

## 2024-03-18 DIAGNOSIS — I129 Hypertensive chronic kidney disease with stage 1 through stage 4 chronic kidney disease, or unspecified chronic kidney disease: Secondary | ICD-10-CM | POA: Diagnosis not present

## 2024-03-18 DIAGNOSIS — Z79899 Other long term (current) drug therapy: Secondary | ICD-10-CM | POA: Diagnosis not present

## 2024-03-18 DIAGNOSIS — D631 Anemia in chronic kidney disease: Secondary | ICD-10-CM | POA: Diagnosis not present

## 2024-03-18 DIAGNOSIS — D509 Iron deficiency anemia, unspecified: Secondary | ICD-10-CM

## 2024-03-18 DIAGNOSIS — N189 Chronic kidney disease, unspecified: Secondary | ICD-10-CM | POA: Diagnosis not present

## 2024-03-18 NOTE — Progress Notes (Signed)
 [facility] [address], [place] [address]    Clinic Day:  03/18/24   Referring physician: Omie Bickers, MD  Patient Care Team: Omie Bickers, MD as PCP - General (Internal Medicine) Myrle Aspen, [facility] (Inactive) as Pharmacist (Pharmacist) Vinetta Greening, DO as Consulting Physician (Internal Medicine) Vinetta Greening, DO as Consulting Physician (Gastroenterology) Paulett Boros, MD as Medical Oncologist (Medical Oncology) Gerhard Knuckles, RN as Oncology Nurse Navigator (Medical Oncology)   ASSESSMENT & PLAN:   Assessment: 1.  Castration refractory prostate cancer, T3 N1 M0: - 04/27/2017: Prostatic adenocarcinoma Gleason 4+5=9 involving both lobes, PSA 9.8 - Status post long-term ADT plus definitive EBRT from 08/27/2017 through 10/26/2017 - History of radiation-induced cystitis and proctitis - Patient on Eligard  every 6 months since 03/19/2020 - PSA <0.1 (09/01/2021), 0.2 (03/09/2022), 0.6 (07/05/2022), 0.9 (09/19/2022), 1.8 (2/29/2022) - PSMA PET (12/14/2022): Marked uptake in the prostate gland compatible with residual/recurrent prostate cancer.  No evidence of nodal or distant metastatic disease.  No focal bone mets. - Darolutamide  600 mg twice daily started on 01/16/2023.  Discontinued on 10/11/2023 as he developed seizures. - Invitae germline: MSH 3 heterozygous VUS.  No pathogenic variants detected. - CARIS ASSURE (03/21/2023): No pathogenic tumor derived somatic variants detected.  TMB indeterminate.  MSI indeterminate.  Most likely low tumor circulating cells. - Abiraterone  and prednisone  started on 11/13/2023 (enzalutamide discontinued due to seizures)   2.  Social/family history: - Lives at home with his wife.  Independent of ADLs and IADLs.  He had right BKA and left AKA from diabetes and has prosthesis.  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  He is a non-smoker. - Father had lung cancer.  Maternal grand father had  prostate cancer.  Sister had breast cancer.    Plan: 1.  M0 Castrate resistant prostate cancer: - He is tolerating abiraterone  and prednisone  very well.  Denies any falls. - Labs from 03/13/2024: Creatinine 1.54, LFTs stable.  PSA is stable at 0.01. - Continue abiraterone  1000 mg daily and prednisone  5 mg daily. - He will receive Eligard  45 mg in 3 weeks. - RTC 12 weeks for follow-up with repeat PSA and labs.   2.  Bone health (DEXA 06/22/2022 T-score -0.4): - He will continue calcium  and vitamin D  supplements.  Will repeat DEXA scan prior to next visit.   3.  Normocytic anemia: - Combination anemia from CKD and functional iron deficiency.  Last Monoferric  was in June 2024. - Latest ferritin is stable at 231, saturation 36.  Hemoglobin is 11.8 and stable.  No indication for parenteral iron therapy at this time.  4.  Hypertension: - Continue atenolol  25 mg daily.  Blood pressure is well-controlled at 116/71.  Continue Lasix  20 mg on Monday, Wednesday and Friday.    Orders Placed This Encounter  Procedures   DG Bone Density    Standing Status:   Future    Expected Date:   06/18/2024    Expiration Date:   03/18/2025    Reason for Exam (SYMPTOM  OR DIAGNOSIS REQUIRED):   testosterone  blocking therapy    Preferred imaging location?:   [facility]      I,Helena R Teague,acting as a scribe for Paulett Boros, MD.,have documented all relevant documentation on the behalf of Paulett Boros, MD,as directed by  Paulett Boros, MD while in the presence of Paulett Boros, MD.  I, Paulett Boros MD, have reviewed the  above documentation for accuracy and completeness, and I agree with the above.      Paulett Boros, MD   5/20/20251:02 PM  CHIEF COMPLAINT:   Diagnosis: prostate cancer    Cancer Staging  Prostate cancer [facility]) Staging form: Prostate, AJCC 8th Edition - Clinical: Stage IVA (cT1c, cN1, cM0, PSA: 9.8, Grade Group: 5) - Signed by  Kenith Payer, MD on 06/09/2017    Prior Therapy: radiation therapy 10/29-12/28/2018   Current Therapy:  Leurpolide injections and abiraterone  plus prednisone    HISTORY OF PRESENT ILLNESS:   Oncology History   No history exists.     INTERVAL HISTORY:   [name] is a 73 y.o. male seen for follow-up of prostate cancer.  He was last seen by me on 01/23/24.  Since his last visit, he underwent a colonoscopy on 01/28/24 with Dr. Mordechai April. Pathology of polypectomies found in the descending colon, ascending colon, and appendiceal orifice were tubular adenomas, negative for high-grade dysplasia.   Today, he states that he is doing well overall. His appetite level is at 100%. His energy level is at 60%.   PAST MEDICAL HISTORY:   Past Medical History: Past Medical History:  Diagnosis Date   Arthritis    Cerebrovascular disease    MRI shows Right carotid inferior cavernours narrowing 75% and  50-75% stenosis of Cavernous and supraclinoi right side   CKD (chronic kidney disease) 06/28/2014   Sees Dr Ada Acres   Diabetic retinopathy [facility])    Hyperlipidemia    Hypertension    Myocardial infarction [facility])    "mild" heart attack   Necrosis (HCC)    #2 nail    Pneumonia    as a child   Poor circulation of extremity    Prostate cancer (HCC) 2017   Prostate   Sleep apnea    uses cpap, getting a new one   Type 2 Diabetes mellitus    Type 2    Surgical History: Past Surgical History:  Procedure Laterality Date   ABOVE KNEE LEG AMPUTATION Left 2004   started out below knee and then extended to above knee due to poor healing   AMPUTATION Right 04/28/2015   Procedure: AMPUTATION RAY, RIGHT 5TH TOE;  Surgeon: Adah Acron, MD;  Location: [facility] OR;  Service: Orthopedics;  Laterality: Right;   AMPUTATION Right 05/10/2015   Procedure: Right Below Knee Amputation;  Surgeon: Adah Acron, MD;  Location: [facility] OR;  Service: Orthopedics;  Laterality: Right;   CATARACT EXTRACTION W/PHACO  09/05/2012    Procedure: CATARACT EXTRACTION PHACO AND INTRAOCULAR LENS PLACEMENT (IOC);  Surgeon: Anner Kill, MD;  Location: [facility] ORS;  Service: Ophthalmology;  Laterality: Left;  CDE=5.45   CATARACT EXTRACTION W/PHACO  10/03/2012   Procedure: CATARACT EXTRACTION PHACO AND INTRAOCULAR LENS PLACEMENT (IOC);  Surgeon: Anner Kill, MD;  Location: [facility] ORS;  Service: Ophthalmology;  Laterality: Right;  CDE: 12.31   COLONOSCOPY  2011   Dr. Arvie Latus: colon polyps, tubular adenoma   COLONOSCOPY N/A 11/01/2018   Dr. Riley Cheadle: Blood noted in the rectal vault.  Vascular pattern of the rectum abnormal, neovascular changes distally and actively oozing.  There were 3 2 to 5 mm polyps in the splenic flexure, cecum removed.  Cecal polyp was not recovered.  Radiation proctitis status post APC treatment.  The splenic flexure polyps were tubular adenomas.  Next colonoscopy in 5 years.   COLONOSCOPY WITH PROPOFOL  N/A 01/28/2024   Procedure: COLONOSCOPY WITH PROPOFOL ;  Surgeon: Vinetta Greening, DO;  Location: [facility] END[facility]  SU[facility];  Service: Endoscopy;  Laterality: N/A;  7:30AM, ASA 3   FLEXIBLE SIGMOIDOSCOPY N/A 10/27/2019   Procedure: FLEXIBLE SIGMOIDOSCOPY;  Surgeon: Alyce Jubilee, MD; rectal bleeding due to radiation proctitis s/p APC therapy (actively bleeding during exam), rectosigmoid colon and sigmoid colon appeared normal.   FLEXIBLE SIGMOIDOSCOPY N/A 03/13/2022   Procedure: FLEXIBLE SIGMOIDOSCOPY;  Surgeon: Vinetta Greening, DO;  Location: [facility];  Service: Endoscopy;  Laterality: N/A;  1:45pm   HOT HEMOSTASIS  03/13/2022   Procedure: HOT HEMOSTASIS (ARGON PLASMA COAGULATION/BICAP);  Surgeon: Vinetta Greening, DO;  Location: [facility];  Service: Endoscopy;;   POLYPECTOMY  11/01/2018   Procedure: POLYPECTOMY;  Surgeon: Suzette Espy, MD;  Location: [facility];  Service: Endoscopy;;  cecum,splenic flexure   POLYPECTOMY  01/28/2024   Procedure: POLYPECTOMY;  Surgeon: Vinetta Greening, DO;  Location: [facility];   Service: Endoscopy;;   REFRACTIVE SURGERY Left 08/01/2021   Cleaned cataract    Social History: Social History   Socioeconomic History   Marital status: Married    Spouse name: Not on file   Number of children: 2   Years of education: college   Highest education level: Not on file  Occupational History   Occupation: Engineer, materials: [employer]  Tobacco Use   Smoking status: Never    Passive exposure: Never   Smokeless tobacco: Never  Vaping Use   Vaping status: Never Used  Substance and Sexual Activity   Alcohol use: No   Drug use: No   Sexual activity: Not Currently  Other Topics Concern   Not on file  Social History Narrative   Patient lives with his wife Patient right handedPatient drinks caffine on occ.   Pt retired    Teacher, early years/pre Strain: Low Risk  (07/05/2023)   Received from [facility]   Overall Financial Resource Strain (CARDIA)    Difficulty of Paying Living Expenses: Not hard at all  Food Insecurity: No Food Insecurity (07/05/2023)   Received from [facility]   Hunger Vital Sign    Worried About Running Out of Food in the Last Year: Never true    Ran Out of Food in the Last Year: Never true  Transportation Needs: No Transportation Needs (07/05/2023)   Received from [facility] - Transportation    Lack of Transportation (Medical): No    Lack of Transportation (Non-Medical): No  Physical Activity: Sufficiently Active (07/05/2023)   Received from [facility]   Exercise Vital Sign    Days of Exercise per Week: 6 days    Minutes of Exercise per Session: 30 min  Stress: No Stress Concern Present (07/05/2023)   Received from [facility] - Occupational Stress Questionnaire    Feeling of Stress : Not at all  Social Connections: Socially Integrated (07/05/2023)   Received from [facility]   Social Connection and Isolation Panel [NHANES]     Frequency of Communication with Friends and Family: More than three times a week    Frequency of Social Gatherings with Friends and Family: More than three times a week    Attends Religious Services: More than 4 times per year    Active Member of [employer] or Organizations: Yes    Attends Engineer, structural: More than 4 times per year    Marital Status: Married  Intimate Partner Violence: Not At Risk (07/05/2023)   Received from [facility]   Humiliation, Afraid, Rape, and Kick questionnaire    Fear of Current or Ex-Partner: No    Emotionally Abused: No    Physically Abused: No    Sexually Abused: No    Family History: Family History  Problem Relation Age of Onset   Diabetes Mother    Hypertension Mother    Stroke Mother    Lung cancer Father 53   Breast cancer Sister 85   Stroke Sister    Stroke Sister    Prostate cancer Maternal Grandfather    Stroke Maternal Grandfather    Sickle cell anemia Daughter    Prostate cancer Cousin    Prostate cancer Cousin    Colon cancer Neg Hx    Seizures Neg Hx     Current Medications:  Current Outpatient Medications:    abiraterone  acetate (ZYTIGA ) 250 MG tablet, Take 4 tablets (1,000 mg total) by mouth daily. Take on an empty stomach 1 hour before or 2 hours after a meal, Disp: 120 tablet, Rfl: 2   acetaminophen  (TYLENOL ) 325 MG tablet, Take 650 mg by mouth daily as needed for mild pain, moderate pain or headache., Disp: , Rfl:    atenolol  (TENORMIN ) 25 MG tablet, TAKE 1 TABLET BY MOUTH EVERY DAY, Disp: 90 tablet, Rfl: 1   atorvastatin  (LIPITOR) 20 MG tablet, Take 20 mg by mouth daily., Disp: , Rfl:    B-D UF III MINI PEN NEEDLES 31G X 5 MM MISC, USE FOUR TIMES DAILY AS DIRECTED, Disp: 150 each, Rfl: 2   Calcium  Carb-Cholecalciferol 600-500 MG-UNIT CAPS, Take 2 capsules by mouth daily. , Disp: , Rfl:    clopidogrel  (PLAVIX ) 75 MG tablet, TAKE 1 TABLET BY MOUTH EVERY DAY, Disp: 90 tablet, Rfl: 1   CONTOUR TEST test strip, TEST  TWICE DAILY E11.65, Disp: 200 each, Rfl: 2   furosemide  (LASIX ) 20 MG tablet, Take 20 mg by mouth 3 (three) times a week., Disp: , Rfl:    insulin  degludec (TRESIBA  FLEXTOUCH) 100 UNIT/ML FlexTouch Pen, INJECT 20 UNITS INTO THE SKIN AT BEDTIME., Disp: 30 mL, Rfl: 0   latanoprost (XALATAN) 0.005 % ophthalmic solution, Place 1 drop into both eyes at bedtime., Disp: , Rfl:    levETIRAcetam  (KEPPRA ) 250 MG tablet, Take 1 tablet (250 mg total) by mouth 2 (two) times daily., Disp: 60 tablet, Rfl: 6   predniSONE  (DELTASONE ) 5 MG tablet, Take 1 tablet (5 mg total) by mouth daily with breakfast., Disp: 30 tablet, Rfl: 5   prochlorperazine  (COMPAZINE ) 10 MG tablet, Take 1 tablet (10 mg total) by mouth every 6 (six) hours as needed for nausea or vomiting., Disp: 30 tablet, Rfl: 1   ROCKLATAN 0.02-0.005 % SOLN, , Disp: , Rfl:    Semaglutide , 1 MG/DOSE, 4 MG/3ML SOPN, Inject 1 mg as directed once a week., Disp: 6 mL, Rfl: 3   Vibegron  (GEMTESA ) 75 MG TABS, Take 1 tablet (75 mg total) by mouth daily., Disp: 30 tablet, Rfl: 11   Vibegron  (GEMTESA ) 75 MG TABS, Take 1 tablet (75 mg total) by mouth daily., Disp: , Rfl:    Allergies: Allergies  Allergen Reactions   Zolpidem Tartrate Other (See Comments)    disorientation     REVIEW OF SYSTEMS:   Review of Systems  Constitutional:  Negative for chills, fatigue and fever.  HENT:   Negative for lump/mass, mouth sores, nosebleeds, sore throat and trouble swallowing.   Eyes:  Negative for eye problems.  Respiratory:  Negative for cough and shortness of breath.   Cardiovascular:  Negative for chest pain, leg swelling and palpitations.  Gastrointestinal:  Negative for abdominal pain, constipation, diarrhea, nausea and vomiting.  Genitourinary:  Negative for bladder incontinence, difficulty urinating, dysuria, frequency, hematuria and nocturia.   Musculoskeletal:  Negative for arthralgias, back pain, flank pain, myalgias and neck pain.  Skin:  Negative for  itching and rash.  Neurological:  Positive for headaches and numbness. Negative for dizziness.  Hematological:  Does not bruise/bleed easily.  Psychiatric/Behavioral:  Negative for depression, sleep disturbance and suicidal ideas. The patient is not nervous/anxious.   All other systems reviewed and are negative.    VITALS:   Blood pressure 116/71, pulse 81, temperature 98.2 F (36.8 C), temperature source Tympanic, resp. rate 20, weight 243 lb (110.2 kg), SpO2 97%.  Wt Readings from Last 3 Encounters:  03/18/24 243 lb (110.2 kg)  01/28/24 242 lb 15.9 oz (110.2 kg)  01/23/24 243 lb (110.2 kg)    Body mass index is 33.89 kg/m.  Performance status (ECOG): 1 - Symptomatic but completely ambulatory  PHYSICAL EXAM:   Physical Exam Vitals and nursing note reviewed. Exam conducted with a chaperone present.  Constitutional:      Appearance: Normal appearance.  Cardiovascular:     Rate and Rhythm: Normal rate and regular rhythm.     Pulses: Normal pulses.     Heart sounds: Normal heart sounds.  Pulmonary:     Effort: Pulmonary effort is normal.     Breath sounds: Normal breath sounds.  Abdominal:     Palpations: Abdomen is soft. There is no hepatomegaly, splenomegaly or mass.     Tenderness: There is no abdominal tenderness.  Musculoskeletal:     Right lower leg: No edema.     Left lower leg: No edema.  Lymphadenopathy:     Cervical: No cervical adenopathy.     Right cervical: No superficial, deep or posterior cervical adenopathy.    Left cervical: No superficial, deep or posterior cervical adenopathy.     Upper Body:     Right upper body: No supraclavicular or axillary adenopathy.     Left upper body: No supraclavicular or axillary adenopathy.  Neurological:     General: No focal deficit present.     Mental Status: He is alert and oriented to person, place, and time.  Psychiatric:        Mood and Affect: Mood normal.        Behavior: Behavior normal.     LABS:       Latest Ref Rng & Units 03/13/2024    9:51 AM 01/21/2024   11:18 AM 12/25/2023    9:10 AM  CBC  WBC 4.0 - 10.5 K/uL 5.3  5.6  6.3   Hemoglobin 13.0 - 17.0 g/dL 51.0  25.8  52.7   Hematocrit 39.0 - 52.0 % 35.8  33.3  33.7   Platelets 150 - 400 K/uL 215  184  208       Latest Ref Rng & Units 03/13/2024    9:51 AM 01/21/2024   11:18 AM 12/25/2023    9:10 AM  CMP  Glucose 70 - 99 mg/dL 782  423  536   BUN 8 - 23 mg/dL 23  28  33   Creatinine 0.61 - 1.24 mg/dL 1.44  3.15  4.00   Sodium 135 - 145 mmol/L 139  140  138   Potassium 3.5 - 5.1  mmol/L 3.9  3.8  3.9   Chloride 98 - 111 mmol/L 106  106  105   CO2 22 - 32 mmol/L 24  25  24    Calcium  8.9 - 10.3 mg/dL 8.7  8.5  8.7   Total Protein 6.5 - 8.1 g/dL 6.5  6.2  6.2   Total Bilirubin 0.0 - 1.2 mg/dL 0.9  0.6  0.8   Alkaline Phos 38 - 126 U/L 149  204  123   AST 15 - 41 U/L 21  21  31    ALT 0 - 44 U/L 15  16  20       No results found for: "CEA1", "CEA" / No results found for: "CEA1", "CEA" Lab Results  Component Value Date   PSA1 1.8 12/28/2022   No results found for: "JYN829" No results found for: "CAN125"  No results found for: "TOTALPROTELP", "ALBUMINELP", "A1GS", "A2GS", "BETS", "BETA2SER", "GAMS", "MSPIKE", "SPEI" Lab Results  Component Value Date   TIBC 236 (L) 03/13/2024   TIBC 234 (L) 11/21/2023   TIBC 221 (L) 08/21/2023   FERRITIN 231 03/13/2024   FERRITIN 329 11/21/2023   FERRITIN 402 (H) 08/21/2023   IRONPCTSAT 36 03/13/2024   IRONPCTSAT 37 11/21/2023   IRONPCTSAT 36 08/21/2023   No results found for: "LDH"   STUDIES:   No results found.

## 2024-03-18 NOTE — Patient Instructions (Addendum)
 [facility] at [facility] Discharge Instructions   You were seen and examined today by Dr. Cheree Cords.  He reviewed the results of your lab work which are normal/stable.   Continue Zytiga  and prednisone  as prescribed.  We will see you back in 12 weeks. We will repeat lab work and a bone density test prior to this visit.    Return as scheduled.    Thank you for choosing [facility] at [facility] to provide your oncology and hematology care.  To afford each patient quality time with our provider, please arrive at least 15 minutes before your scheduled appointment time.   If you have a lab appointment with the Cancer Center please come in thru the Main Entrance and check in at the main information desk.  You need to re-schedule your appointment should you arrive 10 or more minutes late.  We strive to give you quality time with our providers, and arriving late affects you and other patients whose appointments are after yours.  Also, if you no show three or more times for appointments you may be dismissed from the clinic at the providers discretion.     Again, thank you for choosing [facility].  Our hope is that these requests will decrease the amount of time that you wait before being seen by our physicians.       _____________________________________________________________  Should you have questions after your visit to [facility], please contact our office at [phone] and follow the prompts.  Our office hours are 8:00 a.m. and 4:30 p.m. Monday - Friday.  Please note that voicemails left after 4:00 p.m. may not be returned until the following business day.  We are closed weekends and major holidays.  You do have access to a nurse 24-7, just call the main number to the clinic [phone] and do not press any options, hold on the line and a nurse will answer the phone.    For prescription refill requests, have your  pharmacy contact our office and allow 72 hours.    Due to Covid, you will need to wear a mask upon entering the hospital. If you do not have a mask, a mask will be given to you at the Main Entrance upon arrival. For doctor visits, patients may have 1 support person age 69 or older with them. For treatment visits, patients can not have anyone with them due to social distancing guidelines and our immunocompromised population.

## 2024-03-31 ENCOUNTER — Other Ambulatory Visit: Payer: Self-pay

## 2024-04-07 ENCOUNTER — Encounter: Payer: Self-pay | Admitting: Gastroenterology

## 2024-04-07 ENCOUNTER — Telehealth: Payer: Self-pay | Admitting: Urology

## 2024-04-07 NOTE — Telephone Encounter (Signed)
 Patient calling the office for samples of medication:   1.  What medication and dosage are you requesting samples for gemtesa   2.  Are you currently out of this medication?  Has 1 left

## 2024-04-10 ENCOUNTER — Ambulatory Visit: Payer: Medicare Other | Admitting: Urology

## 2024-04-10 ENCOUNTER — Encounter: Payer: Self-pay | Admitting: Urology

## 2024-04-10 VITALS — BP 104/64 | HR 105

## 2024-04-10 DIAGNOSIS — R9721 Rising PSA following treatment for malignant neoplasm of prostate: Secondary | ICD-10-CM

## 2024-04-10 DIAGNOSIS — C61 Malignant neoplasm of prostate: Secondary | ICD-10-CM | POA: Diagnosis not present

## 2024-04-10 DIAGNOSIS — R351 Nocturia: Secondary | ICD-10-CM

## 2024-04-10 DIAGNOSIS — N3941 Urge incontinence: Secondary | ICD-10-CM

## 2024-04-10 DIAGNOSIS — Z87440 Personal history of urinary (tract) infections: Secondary | ICD-10-CM

## 2024-04-10 DIAGNOSIS — C775 Secondary and unspecified malignant neoplasm of intrapelvic lymph nodes: Secondary | ICD-10-CM | POA: Diagnosis not present

## 2024-04-10 DIAGNOSIS — Z192 Hormone resistant malignancy status: Secondary | ICD-10-CM | POA: Diagnosis not present

## 2024-04-10 MED ORDER — LEUPROLIDE ACETATE (6 MONTH) 45 MG ~~LOC~~ KIT: 45.0000 mg | PACK | Freq: Once | SUBCUTANEOUS | Status: DC

## 2024-04-10 MED ORDER — LEUPROLIDE ACETATE (6 MONTH) 45 MG ~~LOC~~ KIT
45.0000 mg | PACK | SUBCUTANEOUS | Status: AC
  Administered 2024-04-10 – 2024-10-13 (×2): 45 mg via SUBCUTANEOUS

## 2024-04-10 NOTE — Progress Notes (Signed)
 Subjective:  1. Prostate cancer (HCC)   2. Malignant neoplasm of prostate metastatic to intrapelvic lymph node (HCC)   3. 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 Urge incontinence   5. Personal history of urinary infection   6. Nocturia     04/10/24: [name] returns today in f/u for his history below.  He remains on Eligard  and was switched to abi/pred because of some neurologic concerns.   His PSA is 0.01.  His T was <0.03 on 01/21/24.  HE has had no hematuria.  He remains on Gemtesa  for OAB and is control and frequency are improved with nocturia x 1. He has some DOE with walking longer distances or up stairs.   He has no weight loss or bone pain.  He is due for Eligard  today.    10/11/23: [name] returns today in f/u for his history of T3 N1 M0 Gleason 9 prostate cancer with right external iliac nodal disease at diagnosis in 2018 and urge incontinence. He had radiation in 2018.   He remains on Eligard  and darolutamide  with a PSA of <0.01.  He has OAB and has been on Gemtesa  but needs samples today. He has some fatigue with treatment.  He has no weight loss or bone pain.  He has had no hematuria or GI compalints.  He had a possible seizure on 09/19/23  has fully recovered.  HE is scheduled to see Neurology in January.  I have reviewed his ER records.  UA is clear today.   He is due for Eligard  today.   04/05/23: [name] returns today in f/u for the history below.  His PSA was up to 1.8 in 2/24 but it is now back to 0.74 after starting darolutamide  on 01/16/23.   He is doing well on the med.  He had some fatigue but is better after getting an iron infusion for anemia.   He has no weight loss.  He has no bone pain but he does have some back pain.  His IPSS is 4.  He has no GI complaints.    His labs on 03/21/23 showed a Hgb of 11.2 and a Cr of 1.67 which were stable.  LFT's were normal.  He did better with the Gemtesa  than the Mybetriq for the UUI.  His UA is clear.    12/28/22: [name] returns today in f/u for the history below.   His PSA has been rising on Eligard  but he didn't get a PSA prior to this visit but had a PSMA PET on 12/14/22 that showed only prostate uptake and no distant disease.  He has been on Eligard  with the last dose on 09/28/22.  His IPSS is 9 with some increased frequency and near UUI on Myrbetriq  25mg .   He has no bone pain or weight loss.  He has no GI complaints.   HIs UA today is concerning for infection.   09/28/22: [name] returns today in f/u for his history of prostate cancer.  His PSA has been rising over the past year from <0.1 on 09/01/21 to 0.2 on 03/09/22, 0.6 on 07/05/22 and 0.9 on 09/19/22.  His last testosterone  level was 5 on 07/05/22.  He has been on Eligard  with the last dose on 03/16/22.  He had no evidence of mets on his CT in May.  He remains on Myrbetriq  25mg  qd for OAB.  He has CKD3b. He has had no weight loss.  He has had no bone pain.  He is  voiding ok with no incontinence but some persistent urgency.  He had hematuria 4-5 months ago but none since.   03/30/22: [name] returns today in f/u for cystoscopy.  The CT hematuria study showed mild bladder wall thickening.    03/16/22: [name] is a 73yo here for followup for a history of T3 N1 M0 Gleason 9 prostate cancer with right external iliac nodal disease at diagnosis in 2018 and urge incontinence. He had radiation in 2018.  He has mild LUTS and mild urgency with mirabegron  25mg  every other day.  He has nocturia x 1.  He has had no hematuria but his UA today has 3-10 RBC's. PSA is 0.2  and his Testosterone  is 10 in 5/22. He is due for eligard  today.  UA is clear today.   He has no weight loss or bone pain and he has rare hot flashes.  He had endoscopy on Monday for rectal bleeding from radiation cystitis and had some fulguration.  His Cr is 1.7 with a GFR of 43.   He has not had any abdominal imaging since 2018.           ROS:  ROS:  A complete review of systems was performed.  All  systems are negative except for pertinent findings as noted.   Review of Systems  Constitutional:  Positive for malaise/fatigue.  Respiratory:  Positive for shortness of breath.   Musculoskeletal:  Positive for back pain and joint pain.  All other systems reviewed and are negative.   Allergies  Allergen Reactions   Zolpidem Tartrate Other (See Comments)    disorientation     Outpatient Encounter Medications as of 04/10/2024  Medication Sig   abiraterone  acetate (ZYTIGA ) 250 MG tablet Take 4 tablets (1,000 mg total) by mouth daily. Take on an empty stomach 1 hour before or 2 hours after a meal   acetaminophen  (TYLENOL ) 325 MG tablet Take 650 mg by mouth daily as needed for mild pain, moderate pain or headache.   atenolol  (TENORMIN ) 25 MG tablet TAKE 1 TABLET BY MOUTH EVERY DAY   atorvastatin  (LIPITOR) 20 MG tablet Take 20 mg by mouth daily.   B-D UF III MINI PEN NEEDLES 31G X 5 MM MISC USE FOUR TIMES DAILY AS DIRECTED   Calcium  Carb-Cholecalciferol 600-500 MG-UNIT CAPS Take 2 capsules by mouth daily.    clopidogrel  (PLAVIX ) 75 MG tablet TAKE 1 TABLET BY MOUTH EVERY DAY   CONTOUR TEST test strip TEST TWICE DAILY E11.65   furosemide  (LASIX ) 20 MG tablet Take 20 mg by mouth 3 (three) times a week.   insulin  degludec (TRESIBA  FLEXTOUCH) 100 UNIT/ML FlexTouch Pen INJECT 20 UNITS INTO THE SKIN AT BEDTIME.   latanoprost (XALATAN) 0.005 % ophthalmic solution Place 1 drop into both eyes at bedtime.   levETIRAcetam  (KEPPRA ) 250 MG tablet Take 1 tablet (250 mg total) by mouth 2 (two) times daily.   predniSONE  (DELTASONE ) 5 MG tablet Take 1 tablet (5 mg total) by mouth daily with breakfast.   prochlorperazine  (COMPAZINE ) 10 MG tablet Take 1 tablet (10 mg total) by mouth every 6 (six) hours as needed for nausea or vomiting.   ROCKLATAN 0.02-0.005 % SOLN    Semaglutide , 1 MG/DOSE, 4 MG/3ML SOPN Inject 1 mg as directed once a week.   Vibegron  (GEMTESA ) 75 MG TABS Take 1 tablet (75 mg total) by mouth  daily.   Vibegron  (GEMTESA ) 75 MG TABS Take 1 tablet (75 mg total) by mouth daily.   Facility-Administered Encounter Medications  as of 04/10/2024  Medication   leuprolide  (6 Month) (ELIGARD ) injection 45 mg   [DISCONTINUED] leuprolide  (6 Month) (ELIGARD ) injection 45 mg    Past Medical History:  Diagnosis Date   Arthritis    Cerebrovascular disease    MRI shows Right carotid inferior cavernours narrowing 75% and  50-75% stenosis of Cavernous and supraclinoi right side   CKD (chronic kidney disease) 06/28/2014   Sees Dr Ada Acres   Diabetic retinopathy [facility])    Hyperlipidemia    Hypertension    Myocardial infarction [facility])    mild heart attack   Necrosis (HCC)    #2 nail    Pneumonia    as a child   Poor circulation of extremity    Prostate cancer (HCC) 2017   Prostate   Sleep apnea    uses cpap, getting a new one   Type 2 Diabetes mellitus    Type 2    Past Surgical History:  Procedure Laterality Date   ABOVE KNEE LEG AMPUTATION Left 2004   started out below knee and then extended to above knee due to poor healing   AMPUTATION Right 04/28/2015   Procedure: AMPUTATION RAY, RIGHT 5TH TOE;  Surgeon: Adah Acron, MD;  Location: [facility] OR;  Service: Orthopedics;  Laterality: Right;   AMPUTATION Right 05/10/2015   Procedure: Right Below Knee Amputation;  Surgeon: Adah Acron, MD;  Location: [facility] OR;  Service: Orthopedics;  Laterality: Right;   CATARACT EXTRACTION W/PHACO  09/05/2012   Procedure: CATARACT EXTRACTION PHACO AND INTRAOCULAR LENS PLACEMENT (IOC);  Surgeon: Anner Kill, MD;  Location: [facility] ORS;  Service: Ophthalmology;  Laterality: Left;  CDE=5.45   CATARACT EXTRACTION W/PHACO  10/03/2012   Procedure: CATARACT EXTRACTION PHACO AND INTRAOCULAR LENS PLACEMENT (IOC);  Surgeon: Anner Kill, MD;  Location: [facility] ORS;  Service: Ophthalmology;  Laterality: Right;  CDE: 12.31   COLONOSCOPY  2011   Dr. Arvie Latus: colon polyps, tubular adenoma   COLONOSCOPY N/A 11/01/2018   Dr. Riley Cheadle: Blood  noted in the rectal vault.  Vascular pattern of the rectum abnormal, neovascular changes distally and actively oozing.  There were 3 2 to 5 mm polyps in the splenic flexure, cecum removed.  Cecal polyp was not recovered.  Radiation proctitis status post APC treatment.  The splenic flexure polyps were tubular adenomas.  Next colonoscopy in 5 years.   COLONOSCOPY WITH PROPOFOL  N/A 01/28/2024   Procedure: COLONOSCOPY WITH PROPOFOL ;  Surgeon: Vinetta Greening, DO;  Location: [facility];  Service: Endoscopy;  Laterality: N/A;  7:30AM, ASA 3   FLEXIBLE SIGMOIDOSCOPY N/A 10/27/2019   Procedure: FLEXIBLE SIGMOIDOSCOPY;  Surgeon: Alyce Jubilee, MD; rectal bleeding due to radiation proctitis s/p APC therapy (actively bleeding during exam), rectosigmoid colon and sigmoid colon appeared normal.   FLEXIBLE SIGMOIDOSCOPY N/A 03/13/2022   Procedure: FLEXIBLE SIGMOIDOSCOPY;  Surgeon: Vinetta Greening, DO;  [facility];  Service: Endoscopy;  Laterality: N/A;  1:45pm   HOT HEMOSTASIS  03/13/2022   Procedure: HOT HEMOSTASIS (ARGON PLASMA COAGULATION/BICAP);  Surgeon: Vinetta Greening, DO;  Location: [facility];  Service: Endoscopy;;   POLYPECTOMY  11/01/2018   Procedure: POLYPECTOMY;  Surgeon: Suzette Espy, MD;  Location: [facility];  Service: Endoscopy;;  cecum,splenic flexure   POLYPECTOMY  01/28/2024   Procedure: POLYPECTOMY;  Surgeon: Vinetta Greening, DO;  Location: [facility];  Service: Endoscopy;;   REFRACTIVE SURGERY Left 08/01/2021   Cleaned cataract    Social History   Socioeconomic  History   Marital status: Married    Spouse name: Not on file   Number of children: 2   Years of education: college   Highest education level: Not on file  Occupational History   Occupation: Engineer, materials: [employer]  Tobacco Use   Smoking status: Never    Passive exposure: Never   Smokeless tobacco: Never  Vaping Use   Vaping status: Never Used  Substance and  Sexual Activity   Alcohol use: No   Drug use: No   Sexual activity: Not Currently  Other Topics Concern   Not on file  Social History Narrative   Patient lives with his wife Patient right handedPatient drinks caffine on occ.   Pt retired    Teacher, early years/pre Strain: Low Risk  (07/05/2023)   Received from [facility]   Overall Financial Resource Strain (CARDIA)    Difficulty of Paying Living Expenses: Not hard at all  Food Insecurity: No Food Insecurity (07/05/2023)   Received from [facility]   Hunger Vital Sign    Within the past 12 months, you worried that your food would run out before you got the money to buy more.: Never true    Within the past 12 months, the food you bought just didn't last and you didn't have money to get more.: Never true  Transportation Needs: No Transportation Needs (07/05/2023)   Received from [facility]   PRAPARE - Transportation    Lack of Transportation (Medical): No    Lack of Transportation (Non-Medical): No  Physical Activity: Sufficiently Active (07/05/2023)   Received from [facility]   Exercise Vital Sign    On average, how many days per week do you engage in moderate to strenuous exercise (like a brisk walk)?: 6 days    On average, how many minutes do you engage in exercise at this level?: 30 min  Stress: No Stress Concern Present (07/05/2023)   Received from [facility] - Occupational Stress Questionnaire    Feeling of Stress : Not at all  Social Connections: Socially Integrated (07/05/2023)   Received from [facility]   Social Connection and Isolation Panel    In a typical week, how many times do you talk on the phone with family, friends, or neighbors?: More than three times a week    How often do you get together with friends or relatives?: More than three times a week    How often do you attend church or religious services?: More than 4 times per year     Do you belong to any clubs or organizations such as church groups, unions, fraternal or athletic groups, or school groups?: Yes    How often do you attend meetings of the clubs or organizations you belong to?: More than 4 times per year    Are you married, widowed, divorced, separated, never married, or living with a partner?: Married  Intimate Partner Violence: Not At Risk (07/05/2023)   Received from [facility]   Humiliation, Afraid, Rape, and Kick questionnaire    Within the last year, have you been afraid of your partner or ex-partner?: No    Within the last year, have you been humiliated or emotionally abused in other ways by your partner or ex-partner?: No    Within the last year, have you been kicked, hit, slapped, or otherwise physically  hurt by your partner or ex-partner?: No    Within the last year, have you been raped or forced to have any kind of sexual activity by your partner or ex-partner?: No    Family History  Problem Relation Age of Onset   Diabetes Mother    Hypertension Mother    Stroke Mother    Lung cancer Father 71   Breast cancer Sister 68   Stroke Sister    Stroke Sister    Prostate cancer Maternal Grandfather    Stroke Maternal Grandfather    Sickle cell anemia Daughter    Prostate cancer Cousin    Prostate cancer Cousin    Colon cancer Neg Hx    Seizures Neg Hx        Objective: Vitals:   04/10/24 1101  BP: 104/64  Pulse: (!) 105      Physical Exam Vitals reviewed.  Constitutional:      Appearance: Normal appearance.   Neurological:     Mental Status: He is alert.     Lab Results:  PSA PSA  Date Value Ref Range Status  03/16/2020 <0.1 < OR = 4.0 ng/mL Final    Comment:    The total PSA value from this assay system is  standardized against the WHO standard. The test  result will be approximately 20% lower when compared  to the equimolar-standardized total PSA (Beckman  Coulter). Comparison of serial PSA results should be   interpreted with this fact in mind. . This test was performed using the Siemens  chemiluminescent method. Values obtained from  different assay methods cannot be used interchangeably. PSA levels, regardless of value, should not be interpreted as absolute evidence of the presence or absence of disease.   01/03/2017 6.8 (H) <=4.0 ng/mL Final    Comment:      The total PSA value from this assay system is standardized against the WHO standard. The test result will be approximately 20% lower when compared to the equimolar-standardized total PSA (Beckman Coulter). Comparison of serial PSA results should be interpreted with this fact in mind.   This test was performed using the Siemens chemiluminescent method. Values obtained from different assay methods cannot be used interchangeably. PSA levels, regardless of value, should not be interpreted as absolute evidence of the presence or absence of disease.      Testosterone   Date Value Ref Range Status  01/21/2024 <3 (L) 264 - 916 ng/dL Final    Comment:    (NOTE) Adult male reference interval is based on a population of healthy nonobese males (BMI <30) between 76 and 40 years old. Travison, et.al. JCEM [phone]. PMID: 21308657. Performed At: [facility] [address] [place], [place] [id number] Pearlean Botts MD WU:[id number]   12/28/2022 10 (L) 264 - 916 ng/dL Final    Comment:    Adult male reference interval is based on a population of healthy nonobese males (BMI <30) between 55 and 66 years old. Travison, et.al. JCEM [phone]. PMID: 56387564.   07/05/2022 5 (L) 264 - 916 ng/dL Final    Comment:    Adult male reference interval is based on a population of healthy nonobese males (BMI <30) between 29 and 47 years old. Travison, et.al. JCEM [phone]. PMID: 01601093.    Lab Results  Component Value Date   PSA1 1.8 12/28/2022   PSA1 0.9 09/19/2022   PSA1 0.6 07/05/2022  PSA on  02/08/23 was 0.74 UA reviewed and clear.   No results found for this  or any previous visit (from the past 24 hours).     Studies/Results: No results found.  No results found.  Records from Dr. Cheree Cords reviewed.     Assessment & Plan: Castrate Resistant Prostate cancer.   He is doing well with good PSA suppression on Eligard  and Abiaterone.  Urge incontinence.  He is doing better with the Gemtesa .  Hx of UTI.  No symptoms but he was not able to void today.   Meds ordered this encounter  Medications   DISCONTD: leuprolide  (6 Month) (ELIGARD ) injection 45 mg   leuprolide  (6 Month) (ELIGARD ) injection 45 mg     No orders of the defined types were placed in this encounter.      Return in about 6 months (around 10/10/2024) for Fu with available MD for Eligard . .   CC: Omie Bickers, MD  and Dr. Paulett Boros.     Homero Luster 04/11/2024 Patient ID: [name], male   DOB: 1951/03/04, 73 y.o.   MRN: [id number]

## 2024-04-11 ENCOUNTER — Other Ambulatory Visit (HOSPITAL_COMMUNITY): Payer: Self-pay

## 2024-04-11 ENCOUNTER — Other Ambulatory Visit: Payer: Self-pay

## 2024-04-11 NOTE — Progress Notes (Signed)
 Specialty Pharmacy Refill Coordination Note  [name] is a 73 y.o. male contacted today regarding refills of specialty medication(s) Abiraterone  Acetate (ZYTIGA )   Patient requested Delivery   Delivery date: 04/14/24   Verified address: [address]  [name] [address]   Medication will be filled on 04/11/24.

## 2024-04-17 ENCOUNTER — Encounter: Payer: Self-pay | Admitting: Nurse Practitioner

## 2024-04-17 ENCOUNTER — Ambulatory Visit (INDEPENDENT_AMBULATORY_CARE_PROVIDER_SITE_OTHER): Payer: Medicare Other | Admitting: Nurse Practitioner

## 2024-04-17 ENCOUNTER — Telehealth: Payer: Self-pay | Admitting: *Deleted

## 2024-04-17 VITALS — BP 102/62 | HR 88 | Wt 243.0 lb

## 2024-04-17 DIAGNOSIS — I10 Essential (primary) hypertension: Secondary | ICD-10-CM

## 2024-04-17 DIAGNOSIS — Z7985 Long-term (current) use of injectable non-insulin antidiabetic drugs: Secondary | ICD-10-CM

## 2024-04-17 DIAGNOSIS — Z794 Long term (current) use of insulin: Secondary | ICD-10-CM | POA: Diagnosis not present

## 2024-04-17 DIAGNOSIS — E782 Mixed hyperlipidemia: Secondary | ICD-10-CM

## 2024-04-17 DIAGNOSIS — E559 Vitamin D deficiency, unspecified: Secondary | ICD-10-CM

## 2024-04-17 DIAGNOSIS — E1159 Type 2 diabetes mellitus with other circulatory complications: Secondary | ICD-10-CM | POA: Diagnosis not present

## 2024-04-17 LAB — POCT GLYCOSYLATED HEMOGLOBIN (HGB A1C): Hemoglobin A1C: 7.4 % — AB (ref 4.0–5.6)

## 2024-04-17 NOTE — Progress Notes (Signed)
 04/17/2024   Endocrinology follow-up note    Subjective:    Patient ID: [name], male    DOB: 10/27/1951, PCP Omie Bickers, MD   Past Medical History:  Diagnosis Date   Arthritis    Cerebrovascular disease    MRI shows Right carotid inferior cavernours narrowing 75% and  50-75% stenosis of Cavernous and supraclinoi right side   CKD (chronic kidney disease) 06/28/2014   Sees Dr Ada Acres   Diabetic retinopathy [facility])    Hyperlipidemia    Hypertension    Myocardial infarction [facility])    mild heart attack   Necrosis (HCC)    #2 nail    Pneumonia    as a child   Poor circulation of extremity    Prostate cancer (HCC) 2017   Prostate   Sleep apnea    uses cpap, getting a new one   Type 2 Diabetes mellitus    Type 2   Past Surgical History:  Procedure Laterality Date   ABOVE KNEE LEG AMPUTATION Left 2004   started out below knee and then extended to above knee due to poor healing   AMPUTATION Right 04/28/2015   Procedure: AMPUTATION RAY, RIGHT 5TH TOE;  Surgeon: Adah Acron, MD;  Location: [facility] OR;  Service: Orthopedics;  Laterality: Right;   AMPUTATION Right 05/10/2015   Procedure: Right Below Knee Amputation;  Surgeon: Adah Acron, MD;  Location: [facility] OR;  Service: Orthopedics;  Laterality: Right;   CATARACT EXTRACTION W/PHACO  09/05/2012   Procedure: CATARACT EXTRACTION PHACO AND INTRAOCULAR LENS PLACEMENT (IOC);  Surgeon: Anner Kill, MD;  Location: [facility] ORS;  Service: Ophthalmology;  Laterality: Left;  CDE=5.45   CATARACT EXTRACTION W/PHACO  10/03/2012   Procedure: CATARACT EXTRACTION PHACO AND INTRAOCULAR LENS PLACEMENT (IOC);  Surgeon: Anner Kill, MD;  Location: [facility] ORS;  Service: Ophthalmology;  Laterality: Right;  CDE: 12.31   COLONOSCOPY  2011   Dr. Arvie Latus: colon polyps, tubular adenoma   COLONOSCOPY N/A 11/01/2018   Dr. Riley Cheadle: Blood noted in the rectal vault.  Vascular pattern of the rectum abnormal, neovascular changes distally and actively oozing.  There were 3 2 to 5  mm polyps in the splenic flexure, cecum removed.  Cecal polyp was not recovered.  Radiation proctitis status post APC treatment.  The splenic flexure polyps were tubular adenomas.  Next colonoscopy in 5 years.   COLONOSCOPY WITH PROPOFOL  N/A 01/28/2024   Procedure: COLONOSCOPY WITH PROPOFOL ;  Surgeon: Vinetta Greening, DO;  Location: [facility] END[facility];  Service: Endoscopy;  Laterality: N/A;  7:30AM, ASA 3   FLEXIBLE SIGMOIDOSCOPY N/A 10/27/2019   Procedure: FLEXIBLE SIGMOIDOSCOPY;  Surgeon: Alyce Jubilee, MD; rectal bleeding due to radiation proctitis s/p APC therapy (actively bleeding during exam), rectosigmoid colon and sigmoid colon appeared normal.   FLEXIBLE SIGMOIDOSCOPY N/A 03/13/2022   Procedure: FLEXIBLE SIGMOIDOSCOPY;  Surgeon: Vinetta Greening, DO;  Location[facility];  Service: Endoscopy;  Laterality: N/A;  1:45pm   HOT HEMOSTASIS  03/13/2022   Procedure: HOT HEMOSTASIS (ARGON PLASMA COAGULATION/BICAP);  Surgeon: Vinetta Greening, DO;  Location: [facility];  Service: Endoscopy;;   POLYPECTOMY  11/01/2018   Procedure: POLYPECTOMY;  Surgeon: Suzette Espy, MD;  Location[facility];  Service: Endoscopy;;  cecum,splenic flexure   POLYPECTOMY  01/28/2024   Procedure: POLYPECTOMY;  Surgeon: Vinetta Greening, DO;  Location: [facility];  Service: Endoscopy;;   REFRACTIVE SURGERY Left 08/01/2021   Cleaned cataract   Social History  Socioeconomic History   Marital status: Married    Spouse name: Not on file   Number of children: 2   Years of education: college   Highest education level: Not on file  Occupational History   Occupation: Engineer, materials: [employer]  Tobacco Use   Smoking status: Never    Passive exposure: Never   Smokeless tobacco: Never  Vaping Use   Vaping status: Never Used  Substance and Sexual Activity   Alcohol use: No   Drug use: No   Sexual activity: Not Currently  Other Topics Concern   Not on file  Social History  Narrative   Patient lives with his wife Patient right handedPatient drinks caffine on occ.   Pt retired    Teacher, early years/pre Strain: Low Risk  (07/05/2023)   Received [facility]   Overall Financial Resource Strain (CARDIA)    Difficulty of Paying Living Expenses: Not hard at all  Food Insecurity: No Food Insecurity (07/05/2023)   Received [facility]   Hunger Vital Sign    Within the past 12 months, you worried that your food would run out before you got the money to buy more.: Never true    Within the past 12 months, the food you bought just didn't last and you didn't have money to get more.: Never true  Transportation Needs: No Transportation Needs (07/05/2023)   Received from [facility]   PRAPARE - Transportation    Lack of Transportation (Medical): No    Lack of Transportation (Non-Medical): No  Physical Activity: Sufficiently Active (07/05/2023)   Received from [facility]   Exercise Vital Sign    On average, how many days per week do you engage in moderate to strenuous exercise (like a brisk walk)?: 6 days    On average, how many minutes do you engage in exercise at this level?: 30 min  Stress: No Stress Concern Present (07/05/2023)   Received from [facility] - Occupational Stress Questionnaire    Feeling of Stress : Not at all  Social Connections: Socially Integrated (07/05/2023)   Received from [facility]   Social Connection and Isolation Panel    In a typical week, how many times do you talk on the phone with family, friends, or neighbors?: More than three times a week    How often do you get together with friends or relatives?: More than three times a week    How often do you attend church or religious services?: More than 4 times per year    Do you belong to any clubs or organizations such as church groups, unions, fraternal or athletic groups, or school groups?: Yes    How  often do you attend meetings of the clubs or organizations you belong to?: More than 4 times per year    Are you married, widowed, divorced, separated, never married, or living with a partner?: Married   Outpatient Encounter Medications as of 04/17/2024  Medication Sig   abiraterone  acetate (ZYTIGA ) 250 MG tablet Take 4 tablets (1,000 mg total) by mouth daily. Take on an empty stomach 1 hour before or 2 hours after a meal   acetaminophen  (TYLENOL ) 325 MG tablet Take 650 mg by mouth daily as needed for mild pain, moderate pain or headache.   atenolol  (TENORMIN ) 25 MG tablet TAKE 1 TABLET BY MOUTH EVERY DAY  atorvastatin  (LIPITOR) 20 MG tablet Take 20 mg by mouth daily.   B-D UF III MINI PEN NEEDLES 31G X 5 MM MISC USE FOUR TIMES DAILY AS DIRECTED   Calcium  Carb-Cholecalciferol 600-500 MG-UNIT CAPS Take 2 capsules by mouth daily.    clopidogrel  (PLAVIX ) 75 MG tablet TAKE 1 TABLET BY MOUTH EVERY DAY   CONTOUR TEST test strip TEST TWICE DAILY E11.65   furosemide  (LASIX ) 20 MG tablet Take 20 mg by mouth 3 (three) times a week.   insulin  degludec (TRESIBA  FLEXTOUCH) 100 UNIT/ML FlexTouch Pen INJECT 20 UNITS INTO THE SKIN AT BEDTIME.   latanoprost (XALATAN) 0.005 % ophthalmic solution Place 1 drop into both eyes at bedtime.   levETIRAcetam  (KEPPRA ) 250 MG tablet Take 1 tablet (250 mg total) by mouth 2 (two) times daily.   predniSONE  (DELTASONE ) 5 MG tablet Take 1 tablet (5 mg total) by mouth daily with breakfast.   prochlorperazine  (COMPAZINE ) 10 MG tablet Take 1 tablet (10 mg total) by mouth every 6 (six) hours as needed for nausea or vomiting.   ROCKLATAN 0.02-0.005 % SOLN    Semaglutide , 1 MG/DOSE, 4 MG/3ML SOPN Inject 1 mg as directed once a week.   Vibegron  (GEMTESA ) 75 MG TABS Take 1 tablet (75 mg total) by mouth daily.   Vibegron  (GEMTESA ) 75 MG TABS Take 1 tablet (75 mg total) by mouth daily.   Facility-Administered Encounter Medications as of 04/17/2024  Medication   leuprolide  (6 Month)  (ELIGARD ) injection 45 mg   ALLERGIES: Allergies  Allergen Reactions   Zolpidem Tartrate Other (See Comments)    disorientation    VACCINATION STATUS: Immunization History  Administered Date(s) Administered   Fluad Quad(high Dose 65+) 07/01/2019   Influenza Split 06/30/2013, 08/30/2013, 08/23/2020   Influenza, High Dose Seasonal PF 08/21/2023   Influenza-Unspecified 08/28/2003, 07/30/2015, 06/30/2018   Moderna Sars-Covid-2 Vaccination 11/20/2019, 12/17/2019   Pneumococcal Conjugate-13 01/08/2018   Pneumococcal Polysaccharide-23 07/31/2007, 08/26/2019   Td 10/30/2002   Tdap 06/19/2016   Zoster, Live 06/19/2016    Diabetes He presents for his follow-up diabetic visit. He has type 2 diabetes mellitus. Onset time: He was diagnosed at approximate age of 41 years. His disease course has been stable. There are no hypoglycemic associated symptoms. Pertinent negatives for hypoglycemia include no confusion, pallor or seizures. There are no diabetic associated symptoms. Pertinent negatives for diabetes include no fatigue, no polydipsia, no polyphagia, no polyuria and no weakness. There are no hypoglycemic complications. Symptoms are stable. Diabetic complications include heart disease, nephropathy and PVD. (Status post bilateral lower extremity amputations with prosthetics.) Risk factors for coronary artery disease include diabetes mellitus, dyslipidemia, hypertension, male sex, obesity, sedentary lifestyle and tobacco exposure. Current diabetic treatment includes insulin  injections (and Ozempic ). He is compliant with treatment most of the time. His weight is fluctuating minimally. He is following a diabetic diet. When asked about meal planning, he reported none. He has had a previous visit with a dietitian. He participates in exercise daily (5 x per week). His home blood glucose trend is fluctuating minimally. His overall blood glucose range is 110-130 mg/dl. (He presents today with no meter or logs  (accidentally left his meter at home).  His POCT A1c today is 7.4%, increasing from last visit of 6.9%.  He was started on Prednisone  daily by oncologist.  He is tolerating the Ozempic  well, no unpleasant side effects.  He says his glucose has been ranging between 110-125 mostly, did have it go up to 165 one morning.) An ACE inhibitor/angiotensin  II receptor blocker is not being taken. He does not see a podiatrist.Eye exam is current.  Hyperlipidemia This is a chronic problem. The current episode started more than 1 year ago. The problem is controlled. Recent lipid tests were reviewed and are normal. Exacerbating diseases include chronic renal disease, diabetes and obesity. Factors aggravating his hyperlipidemia include beta blockers. Pertinent negatives include no myalgias. Current antihyperlipidemic treatment includes statins. The current treatment provides moderate improvement of lipids. There are no compliance problems.  Risk factors for coronary artery disease include dyslipidemia, diabetes mellitus, hypertension, male sex, obesity and a sedentary lifestyle.  Hypertension This is a chronic problem. The current episode started more than 1 year ago. The problem is unchanged. The problem is controlled. There are no associated agents to hypertension. Risk factors for coronary artery disease include diabetes mellitus, dyslipidemia, family history, male gender and sedentary lifestyle. Past treatments include beta blockers and diuretics. The current treatment provides moderate improvement. There are no compliance problems.  Hypertensive end-organ damage includes kidney disease and PVD. Identifiable causes of hypertension include chronic renal disease.    Review of systems  Constitutional: + Minimally fluctuating body weight,  current Body mass index is 33.89 kg/m. , no fatigue, no subjective hyperthermia, no subjective hypothermia Eyes: no blurry vision, no xerophthalmia ENT: no sore throat, no nodules  palpated in throat, no dysphagia/odynophagia, no hoarseness Cardiovascular: no chest pain, no shortness of breath, no palpitations Respiratory: no cough, no shortness of breath Gastrointestinal: no nausea/vomiting/diarrhea Musculoskeletal: no muscle/joint aches, walks with walker d/t BLE amputations with prosthesis Skin: no rashes, no hyperemia Neurological: no tremors, no numbness, no tingling, no dizziness Psychiatric: no depression, no anxiety    Objective:    BP 102/62 (BP Location: Right Arm, Patient Position: Sitting, Cuff Size: Large)   Pulse 88   Wt 243 lb (110.2 kg)   BMI 33.89 kg/m   Wt Readings from Last 3 Encounters:  04/17/24 243 lb (110.2 kg)  03/18/24 243 lb (110.2 kg)  01/28/24 242 lb 15.9 oz (110.2 kg)    BP Readings from Last 3 Encounters:  04/17/24 102/62  04/10/24 104/64  03/18/24 116/71    Physical Exam- Limited  Constitutional:  Body mass index is 33.89 kg/m., not in acute distress, normal state of mind Eyes:  EOMI, no exophthalmos Musculoskeletal: walks with walker d/t BLE amputations with prosthesis Skin:  no rashes, no hyperemia Neurological: no tremor with outstretched hands    Results for orders placed or performed in visit on 04/17/24  HgB A1c   Collection Time: 04/17/24 10:50 AM  Result Value Ref Range   Hemoglobin A1C 7.4 (A) 4.0 - 5.6 %   HbA1c POC (<> result, manual entry)     HbA1c, POC (prediabetic range)     HbA1c, POC (controlled diabetic range)     Complete Blood Count (Most recent): Lab Results  Component Value Date   WBC 5.3 03/13/2024   HGB 11.8 (L) 03/13/2024   HCT 35.8 (L) 03/13/2024   MCV 95.2 03/13/2024   PLT 215 03/13/2024   Chemistry (most recent): Lab Results  Component Value Date   NA 139 03/13/2024   K 3.9 03/13/2024   CL 106 03/13/2024   CO2 24 03/13/2024   BUN 23 03/13/2024   CREATININE 1.54 (H) 03/13/2024   Diabetic Labs (most recent): Lab Results  Component Value Date   HGBA1C 7.4 (A)  04/17/2024   HGBA1C 6.1 08/29/2023   HGBA1C 6.5 (A) 08/15/2022   MICROALBUR 30 08/06/2020  MICROALBUR 0.2 10/31/2016   Lipid Panel     Component Value Date/Time   CHOL 125 08/29/2023 0000   CHOL 113 01/31/2021 0937   TRIG 75 08/29/2023 0000   TRIG 98 10/27/2013 1305   HDL 50 08/29/2023 0000   HDL 47 01/31/2021 0937   HDL 39 (L) 10/27/2013 1305   CHOLHDL 2.4 01/31/2021 0937   CHOLHDL 3.0 02/24/2020 0915   VLDL 14 10/31/2016 1137   LDLCALC 60 08/29/2023 0000   LDLCALC 51 01/31/2021 0937   LDLCALC 50 02/24/2020 0915   LDLCALC 82 10/27/2013 1305    Assessment & Plan:   1) Type 2 diabetes mellitus with other circulatory complications (HCC)  -His diabetes is complicated by extensive peripheral arterial disease with bilateral lower extremity amputations and CKD and he remains at a high risk for more acute and chronic complications of diabetes which include CAD, CVA, CKD, retinopathy, and neuropathy. These are all discussed in detail with the patient.  He presents today with no meter or logs (accidentally left his meter at home).  His POCT A1c today is 7.4%, increasing from last visit of 6.9%.  He was started on Prednisone  daily by oncologist.  He is tolerating the Ozempic  well, no unpleasant side effects.  He says his glucose has been ranging between 110-125 mostly, did have it go up to 165 one morning.     -Recent labs reviewed.  - Nutritional counseling repeated at each appointment due to patients tendency to fall back in to old habits.  - The patient admits there is a room for improvement in their diet and drink choices. -  Suggestion is made for the patient to avoid simple carbohydrates from their diet including Cakes, Sweet Desserts / Pastries, Ice Cream, Soda (diet and regular), Sweet Tea, Candies, Chips, Cookies, Sweet Pastries, Store Bought Juices, Alcohol in Excess of 1-2 drinks a day, Artificial Sweeteners, Coffee Creamer, and Sugar-free Products. This will help patient to  have stable blood glucose profile and potentially avoid unintended weight gain.   - I encouraged the patient to switch to unprocessed or minimally processed complex starch and increased protein intake (animal or plant source), fruits, and vegetables.   - Patient is advised to stick to a routine mealtimes to eat 3 meals a day and avoid unnecessary snacks (to snack only to correct hypoglycemia).  - I have approached patient with the following individualized plan to manage diabetes and patient agrees.  -Given his stable glycemic profile, will continue current medication regimen.   -He is advised to continue Tresiba  20 units SQ nightly, will increase his Ozempic  to 2 mg SQ weekly (will update dosage with PAP).  Ultimate goal is to maximize Ozempic  and get off basal insulin  in the future.    -He is encouraged to continue monitoring blood glucose twice daily, before breakfast and before bed, and call the clinic if he gets readings less than 70 or greater than 200 for 3 tests in a row.  - His insurance did not provide coverage for Ozempic  nor Trulicity  but he does have help with Patient assistance program.  2) Hypertension: His blood pressure is controlled to target.  He is advised to continue Atenolol  25 mg po daily, and Lasix  20 mg po every other day.  3) Lipids/HPL:  His most recent lipid panel from 08/29/23 shows controlled LDL at 60.  He is advised to continue Lipitor 80 mg po daily at bedtime.  Side effects and precautions discussed with him.   - I  advised patient to maintain close follow up with Hall, John Z, MD for primary care needs.      I spent  36  minutes in the care of the patient today including review of labs from CMP, Lipids, Thyroid  Function, Hematology (current and previous including abstractions from other facilities); face-to-face time discussing  his blood glucose readings/logs, discussing hypoglycemia and hyperglycemia episodes and symptoms, medications doses, his options  of short and long term treatment based on the latest standards of care / guidelines;  discussion about incorporating lifestyle medicine;  and documenting the encounter. Risk reduction counseling performed per USPSTF guidelines to reduce obesity and cardiovascular risk factors.     Please refer to Patient Instructions for Blood Glucose Monitoring and Insulin /Medications Dosing Guide  in media tab for additional information. Please  also refer to  Patient Self Inventory in the Media  tab for reviewed elements of pertinent patient history.  Sharren Decree Chovan participated in the discussions, expressed understanding, and voiced agreement with the above plans.  All questions were answered to his satisfaction. he is encouraged to contact clinic should he have any questions or concerns prior to his return visit.    Follow up plan: -Return in about 4 months (around 08/17/2024) for Diabetes F/U with A1c in office, No previsit labs, Bring meter and logs.  Hulon Magic, [facility] [address] [place], [place] [address] Phone: [phone] Fax: [phone]  04/17/2024, 11:53 AM

## 2024-04-17 NOTE — Telephone Encounter (Signed)
 Updated medication sent to [facility].

## 2024-04-18 ENCOUNTER — Other Ambulatory Visit: Payer: Self-pay | Admitting: Hematology

## 2024-04-21 ENCOUNTER — Encounter: Payer: Self-pay | Admitting: Hematology

## 2024-04-21 ENCOUNTER — Telehealth: Payer: Self-pay | Admitting: Nurse Practitioner

## 2024-04-21 NOTE — Telephone Encounter (Signed)
 Called pt and let him know we needed a new PAP form filled out, pt asked us  to mail to home address listed on file.

## 2024-04-28 ENCOUNTER — Telehealth: Payer: Self-pay | Admitting: *Deleted

## 2024-04-28 NOTE — Telephone Encounter (Signed)
 Patient's part of the [facility] was sent to today.

## 2024-04-29 ENCOUNTER — Other Ambulatory Visit: Payer: Self-pay

## 2024-05-05 ENCOUNTER — Encounter (INDEPENDENT_AMBULATORY_CARE_PROVIDER_SITE_OTHER): Payer: Self-pay

## 2024-05-05 ENCOUNTER — Other Ambulatory Visit: Payer: Self-pay | Admitting: Pharmacy Technician

## 2024-05-05 ENCOUNTER — Other Ambulatory Visit: Payer: Self-pay

## 2024-05-05 ENCOUNTER — Other Ambulatory Visit (HOSPITAL_COMMUNITY): Payer: Self-pay

## 2024-05-05 NOTE — Progress Notes (Signed)
 Specialty Pharmacy Refill Coordination Note  [name] is a 74 y.o. male contacted today regarding refills of specialty medication(s) Abiraterone  Acetate (ZYTIGA )   Patient requested (Patient-Rptd) Delivery   Delivery date: 05/08/2024 Verified address: (Patient-Rptd) [address] [place], [place] [address]   Medication will be filled on 05/07/2024.

## 2024-05-06 ENCOUNTER — Other Ambulatory Visit: Payer: Self-pay

## 2024-05-07 ENCOUNTER — Other Ambulatory Visit: Payer: Self-pay

## 2024-05-15 ENCOUNTER — Other Ambulatory Visit: Payer: Self-pay | Admitting: Neurology

## 2024-05-20 ENCOUNTER — Telehealth: Payer: Self-pay

## 2024-05-20 NOTE — Telephone Encounter (Signed)
 Patient requesting Gemtesa  samples. Has only 4 left.  Please advise.  Call:  [phone]

## 2024-05-20 NOTE — Telephone Encounter (Signed)
 Patient called and made aware samples will at front desk.

## 2024-05-26 ENCOUNTER — Other Ambulatory Visit: Payer: Self-pay | Admitting: Pharmacy Technician

## 2024-05-26 ENCOUNTER — Other Ambulatory Visit: Payer: Self-pay

## 2024-05-26 ENCOUNTER — Other Ambulatory Visit: Payer: Self-pay | Admitting: Hematology

## 2024-05-26 ENCOUNTER — Other Ambulatory Visit (HOSPITAL_COMMUNITY): Payer: Self-pay

## 2024-05-26 DIAGNOSIS — C61 Malignant neoplasm of prostate: Secondary | ICD-10-CM

## 2024-05-26 MED ORDER — ABIRATERONE ACETATE 250 MG PO TABS
1000.0000 mg | ORAL_TABLET | Freq: Every day | ORAL | 2 refills | Status: DC
  Filled 2024-05-26: qty 120, 30d supply, fill #0
  Filled 2024-06-23: qty 120, 30d supply, fill #1
  Filled 2024-07-28: qty 120, 30d supply, fill #2

## 2024-05-26 NOTE — Progress Notes (Signed)
 Specialty Pharmacy Refill Coordination Note  [name] is a 73 y.o. male contacted today regarding refills of specialty medication(s) Abiraterone  Acetate (ZYTIGA )   Patient requested Delivery   Delivery date: 05/30/24   Verified address: [address]  [name]   Medication will be filled on 05/29/24.  This fill date is pending response to refill request from provider. Patient is aware and if they have not received fill by intended date they must follow up with pharmacy.

## 2024-05-26 NOTE — Progress Notes (Signed)
 Specialty Pharmacy Ongoing Clinical Assessment Note  [name] is a 73 y.o. male who is being followed by the specialty pharmacy service for RxSp Oncology   Patient's specialty medication(s) reviewed today: Abiraterone  Acetate (ZYTIGA )   Missed doses in the last 4 weeks: 0   Patient/Caregiver did not have any additional questions or concerns.   Therapeutic benefit summary: Patient is achieving benefit   Adverse events/side effects summary: No adverse events/side effects   Patient's therapy is appropriate to: Continue    Goals Addressed             This Visit's Progress    Slow Disease Progression   On track    Patient is on track. Patient will maintain adherence. PSA 0.01 from 03/13/24 labs.           Follow up: 3 months  Shariff Lasky M Valera [place] Specialty Pharmacist

## 2024-05-28 ENCOUNTER — Other Ambulatory Visit: Payer: Self-pay

## 2024-05-29 ENCOUNTER — Other Ambulatory Visit: Payer: Self-pay

## 2024-05-30 ENCOUNTER — Telehealth: Payer: Self-pay | Admitting: Nurse Practitioner

## 2024-05-30 NOTE — Telephone Encounter (Signed)
 Patient called back, he will get it sometime next week

## 2024-05-30 NOTE — Telephone Encounter (Signed)
 Called and Lvm to let pt know PAP of pen needles and ozempic  is ready for pick up

## 2024-06-02 ENCOUNTER — Other Ambulatory Visit: Payer: Self-pay

## 2024-06-02 DIAGNOSIS — D509 Iron deficiency anemia, unspecified: Secondary | ICD-10-CM

## 2024-06-02 DIAGNOSIS — C61 Malignant neoplasm of prostate: Secondary | ICD-10-CM

## 2024-06-02 NOTE — Telephone Encounter (Signed)
 Pts daughter picked up pt assistance

## 2024-06-04 ENCOUNTER — Telehealth: Payer: Self-pay | Admitting: *Deleted

## 2024-06-04 ENCOUNTER — Inpatient Hospital Stay: Attending: Hematology

## 2024-06-04 DIAGNOSIS — E119 Type 2 diabetes mellitus without complications: Secondary | ICD-10-CM | POA: Insufficient documentation

## 2024-06-04 DIAGNOSIS — Z79899 Other long term (current) drug therapy: Secondary | ICD-10-CM | POA: Insufficient documentation

## 2024-06-04 DIAGNOSIS — I10 Essential (primary) hypertension: Secondary | ICD-10-CM | POA: Diagnosis not present

## 2024-06-04 DIAGNOSIS — Z7952 Long term (current) use of systemic steroids: Secondary | ICD-10-CM | POA: Insufficient documentation

## 2024-06-04 DIAGNOSIS — Z7985 Long-term (current) use of injectable non-insulin antidiabetic drugs: Secondary | ICD-10-CM | POA: Insufficient documentation

## 2024-06-04 DIAGNOSIS — D509 Iron deficiency anemia, unspecified: Secondary | ICD-10-CM

## 2024-06-04 DIAGNOSIS — C7951 Secondary malignant neoplasm of bone: Secondary | ICD-10-CM | POA: Diagnosis not present

## 2024-06-04 DIAGNOSIS — D649 Anemia, unspecified: Secondary | ICD-10-CM | POA: Diagnosis not present

## 2024-06-04 DIAGNOSIS — C61 Malignant neoplasm of prostate: Secondary | ICD-10-CM | POA: Diagnosis not present

## 2024-06-04 DIAGNOSIS — Z794 Long term (current) use of insulin: Secondary | ICD-10-CM | POA: Insufficient documentation

## 2024-06-04 LAB — CBC WITH DIFFERENTIAL/PLATELET
Abs Immature Granulocytes: 0.05 K/uL (ref 0.00–0.07)
Basophils Absolute: 0 K/uL (ref 0.0–0.1)
Basophils Relative: 1 %
Eosinophils Absolute: 0.1 K/uL (ref 0.0–0.5)
Eosinophils Relative: 1 %
HCT: 36 % — ABNORMAL LOW (ref 39.0–52.0)
Hemoglobin: 12 g/dL — ABNORMAL LOW (ref 13.0–17.0)
Immature Granulocytes: 1 %
Lymphocytes Relative: 22 %
Lymphs Abs: 1.3 K/uL (ref 0.7–4.0)
MCH: 31.1 pg (ref 26.0–34.0)
MCHC: 33.3 g/dL (ref 30.0–36.0)
MCV: 93.3 fL (ref 80.0–100.0)
Monocytes Absolute: 0.4 K/uL (ref 0.1–1.0)
Monocytes Relative: 7 %
Neutro Abs: 4.2 K/uL (ref 1.7–7.7)
Neutrophils Relative %: 68 %
Platelets: 219 K/uL (ref 150–400)
RBC: 3.86 MIL/uL — ABNORMAL LOW (ref 4.22–5.81)
RDW: 13.8 % (ref 11.5–15.5)
WBC: 6 K/uL (ref 4.0–10.5)
nRBC: 0 % (ref 0.0–0.2)

## 2024-06-04 LAB — COMPREHENSIVE METABOLIC PANEL WITH GFR
ALT: 18 U/L (ref 0–44)
AST: 23 U/L (ref 15–41)
Albumin: 2.6 g/dL — ABNORMAL LOW (ref 3.5–5.0)
Alkaline Phosphatase: 164 U/L — ABNORMAL HIGH (ref 38–126)
Anion gap: 10 (ref 5–15)
BUN: 28 mg/dL — ABNORMAL HIGH (ref 8–23)
CO2: 23 mmol/L (ref 22–32)
Calcium: 8.8 mg/dL — ABNORMAL LOW (ref 8.9–10.3)
Chloride: 105 mmol/L (ref 98–111)
Creatinine, Ser: 1.48 mg/dL — ABNORMAL HIGH (ref 0.61–1.24)
GFR, Estimated: 50 mL/min — ABNORMAL LOW (ref >60)
Glucose, Bld: 187 mg/dL — ABNORMAL HIGH (ref 70–99)
Potassium: 4 mmol/L (ref 3.5–5.1)
Sodium: 138 mmol/L (ref 135–145)
Total Bilirubin: 0.6 mg/dL (ref 0.0–1.2)
Total Protein: 6.5 g/dL (ref 6.5–8.1)

## 2024-06-04 LAB — IRON AND TIBC
Iron: 83 ug/dL (ref 45–182)
Saturation Ratios: 34 % (ref 17.9–39.5)
TIBC: 247 ug/dL — ABNORMAL LOW (ref 250–450)
UIBC: 164 ug/dL

## 2024-06-04 LAB — PSA: Prostatic Specific Antigen: <0.01 ng/mL (ref 0.00–4.00)

## 2024-06-04 LAB — FERRITIN: Ferritin: 237 ng/mL (ref 24–336)

## 2024-06-04 NOTE — Telephone Encounter (Signed)
 Patient called and left a message that he had received his Ozempic  but the Tresiba  was not in the delivery. I called [facility] this morning and the Tresiba  is in the process of being shipped. We may call then back in 1-2 days for an update of the deliver. Patient was called and a message was left on his cell phone.  Patient was also ask to call and let us  know if he was running low oh what he did have of the Tresiba .

## 2024-06-09 NOTE — Telephone Encounter (Signed)
 Called pt on home phone no answer no message came up. Will try again.

## 2024-06-11 ENCOUNTER — Inpatient Hospital Stay: Admitting: Oncology

## 2024-06-12 NOTE — Telephone Encounter (Signed)
 Samples never picked up sent back to nurse 06/12/24

## 2024-06-16 NOTE — Telephone Encounter (Signed)
 Pt made aware that his patient assistance is here

## 2024-06-18 NOTE — Progress Notes (Signed)
 Patient Care Team: Shona Norleen PEDLAR, MD as PCP - General (Internal Medicine) Nicholaus Sherlean CROME, [facility] (Inactive) as Pharmacist (Pharmacist) Cindie Carlin POUR, DO as Consulting Physician (Internal Medicine) Cindie Carlin POUR, DO as Consulting Physician (Gastroenterology) Celestia [name], RN as Oncology Nurse Navigator (Medical Oncology)  Clinic Day:  06/19/2024  Referring physician: Shona Norleen PEDLAR, MD   CHIEF COMPLAINT:  CC: Castration refractory prostate cancer, T3 N1 M0   [name] 73 y.o. male was transferred to my care after his prior physician has left.   ASSESSMENT & PLAN:   Assessment & Plan: [name]  is a 73 y.o. male with castration refractory prostate cancer, T3 N1 M0 and anemia.   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  Gleason score 4+5= 9 First-line: Long-term ADT plus definitive EBRT 08/2022 with elevated PSA, PSMA PET showed recurrence of the prostate. Started on darolutamide  and had seizures on it.  Changed to abiraterone  and prednisone .  Caris assure: TMB indeterminate and MSI indeterminate, tumor fraction low  - Patient tolerating abiraterone  and prednisone  well.  Denies fatigue, musculoskeletal pain.  Continue abiraterone  1000 mg and prednisone  5 mg daily  -Blood pressure under control today. -Labs reviewed: CMP: Stable, slightly elevated ALP from previous.  CBC: Hemoglobin of 12 and iron panel within normal limits  Return to clinic in 12 weeks with labs At risk for decreased bone density Patient is on long-term prednisone  use with risk for osteopenia Bone DEXA scan from 05/2022: Normal no osteopenia or osteoporosis  - Continue vitamin D  and calcium  - Will obtain a DEXA scan now and repeat every 2 years Anemia, unspecified type Anemia likely multifactorial: Chronic kidney disease, prostate cancer Labs reviewed today iron  panel within normal limits  - Continue to monitor at this time Essential hypertension Currently well-controlled with atenolol  and Lasix   - Continue atenolol  and Lasix     The patient understands the plans discussed today and is in agreement with them.  He knows to contact our office if he develops concerns prior to his next appointment.  40 minutes of total time was spent for this patient encounter, including preparation, face-to-face counseling with the patient and coordination of care, physical exam, and documentation of the encounter. > 50% of the time was spent on counseling as documented under my assessment and plan.    LILLETTE Verneta SAUNDERS Teague,acting as a Neurosurgeon for [name], MD.,have documented all relevant documentation on the behalf of [name], MD,as directed by  [name], MD while in the presence of [name], MD.  I, [name] MD, have reviewed the above documentation for accuracy and completeness, and I agree with the above.     [name], MD  [facility] [place] - A DEPT OF [facility] [address] [place] [address] Dept: [phone] Dept Fax: [phone]   Orders Placed This Encounter  Procedures   CBC with Differential    Standing Status:   Future    Expected Date:   09/15/2024    Expiration Date:   12/14/2024   Comprehensive metabolic panel    Standing Status:   Future    Expected Date:   09/15/2024    Expiration Date:   12/14/2024   Magnesium    Standing Status:   Future    Expected Date:  09/15/2024    Expiration Date:   12/14/2024   PSA    Standing Status:   Future    Expected Date:   09/15/2024    Expiration Date:   12/14/2024   Iron and TIBC ([facility] ONLY)    Standing Status:   Future    Expected Date:   09/15/2024    Expiration Date:   12/14/2024   Ferritin    Standing Status:   Future    Expected Date:   09/15/2024    Expiration  Date:   12/14/2024   Vitamin B12    Standing Status:   Future    Expected Date:   09/15/2024    Expiration Date:   12/14/2024   Folate    Standing Status:   Future    Expected Date:   09/15/2024    Expiration Date:   12/14/2024     ONCOLOGY HISTORY:   I have reviewed his chart and materials related to his cancer extensively and collaborated history with the patient. Summary of oncologic history is as follows:   Diagnosis: Castration refractory prostate cancer, T3 N1 M0   -03/30/2017: PSA 9.8 -04/27/2017: Multiple US -guided Prostate biopsies.  Pathology: Prostatic adenocarcinoma involving left and right globes. Gleason Score 4+5=9. Perineural invasion identified in left base lateral and left mid lateral portions of the prostate.  -08/27/2017-10/26/2017: long-term ADT plus definitive EBRT completed -03/19/2020 to current: Eligard  injections every 6 months -09/01/2021: PSA <0.1 -03/09/2022: PSA 0.2 -07/05/2022: PSA 0.6 -09/19/2022: PSA 0.9 -12/14/2022: PSMA PET (done for rising PSA): Marked radiotracer uptake within the prostate gland compatible with residual/recurrent prostate cancer. No convincing evidence of radiotracer avid nodal or distant metastatic disease. -12/28/2022: PSA 1.8 -01/16/2023 to 10/11/2023: Darolutamide  600 mg twice daily, discontinued as patient developed seizures -02/08/2023: Invitae germline: MSH 3 heterozygous VUS. No pathogenic variants detected.  -03/21/2023: Caris Assure: No pathogenic tumor derived somatic variants detected. TMB indeterminate. MSI indeterminate. Most likely low tumor circulating cells.  -11/13/2023 - current: Abiraterone  1000 mg daily and prednisone  5 mg daily  Current Treatment:  Abiraterone  1000 mg and prednisone  5 mg daily  INTERVAL HISTORY:   [name] is here today for follow up. Patient is accompanied by a friend.   He is tolerating Abiraterone  well and denies any side effects to the medication. He also denies any side effects  from prednisone . He notes mild fatigue attributable to age.   He typically ambulates at home with a walker, though he presents today in a wheelchair. He no longer drives himself since developing seizures with Darolutamide .  Takumi is taking all antihypertensives and requires lasix  every other day. His kidney dysfunction is attributable to diabetes and he is followed by [facility] in [place] for this.   He has no smoking history.  I have reviewed the past medical history, past surgical history, social history and family history with the patient and they are unchanged from previous note.  ALLERGIES:  is allergic to zolpidem tartrate.  MEDICATIONS:  Current Outpatient Medications  Medication Sig Dispense Refill   abiraterone  acetate (ZYTIGA ) 250 MG tablet Take 4 tablets (1,000 mg total) by mouth daily. Take on an empty stomach 1 hour before or 2 hours after a meal 120 tablet 2   acetaminophen  (TYLENOL ) 325 MG tablet Take 650 mg by mouth daily as needed for mild pain, moderate pain or headache.     atenolol  (TENORMIN ) 25 MG tablet TAKE 1 TABLET BY MOUTH EVERY DAY 90 tablet 1  atorvastatin  (LIPITOR) 20 MG tablet Take 20 mg by mouth daily.     B-D UF III MINI PEN NEEDLES 31G X 5 MM MISC USE FOUR TIMES DAILY AS DIRECTED 150 each 2   Calcium  Carb-Cholecalciferol 600-500 MG-UNIT CAPS Take 2 capsules by mouth daily.      clopidogrel  (PLAVIX ) 75 MG tablet TAKE 1 TABLET BY MOUTH EVERY DAY 90 tablet 1   CONTOUR TEST test strip TEST TWICE DAILY E11.65 200 each 2   furosemide  (LASIX ) 20 MG tablet Take 20 mg by mouth 3 (three) times a week.     insulin  degludec (TRESIBA  FLEXTOUCH) 100 UNIT/ML FlexTouch Pen INJECT 20 UNITS INTO THE SKIN AT BEDTIME. 30 mL 0   latanoprost (XALATAN) 0.005 % ophthalmic solution Place 1 drop into both eyes at bedtime.     levETIRAcetam  (KEPPRA ) 250 MG tablet TAKE 1 TABLET BY MOUTH TWICE A DAY 180 tablet 2   predniSONE  (DELTASONE ) 5 MG tablet TAKE 1 TABLET BY  MOUTH EVERY DAY WITH BREAKFAST 30 tablet 5   prochlorperazine  (COMPAZINE ) 10 MG tablet Take 1 tablet (10 mg total) by mouth every 6 (six) hours as needed for nausea or vomiting. 30 tablet 1   ROCKLATAN 0.02-0.005 % SOLN      Semaglutide , 1 MG/DOSE, 4 MG/3ML SOPN Inject 1 mg as directed once a week. 6 mL 3   Vibegron  (GEMTESA ) 75 MG TABS Take 1 tablet (75 mg total) by mouth daily. 30 tablet 11   Vibegron  (GEMTESA ) 75 MG TABS Take 1 tablet (75 mg total) by mouth daily.     Current Facility-Administered Medications  Medication Dose Route Frequency Provider Last Rate Last Admin   leuprolide  (6 Month) (ELIGARD ) injection 45 mg  45 mg Subcutaneous Q6 months [name], John, MD   45 mg at 04/10/24 1241    REVIEW OF SYSTEMS:   Constitutional: Denies fevers, chills or abnormal weight loss Eyes: Denies blurriness of vision Ears, nose, mouth, throat, and face: Denies mucositis or sore throat Respiratory: Denies dyspnea or wheezes Cardiovascular: Denies palpitation, chest discomfort or lower extremity swelling Gastrointestinal:  Denies nausea, heartburn or change in bowel habits Skin: Denies abnormal skin rashes Lymphatics: Denies new lymphadenopathy or easy bruising Neurological: Positive for numbness in hands, Denies tingling or new weaknesses Behavioral/Psych: Mood is stable, no new changes  All other systems were reviewed with the patient and are negative.   VITALS:  Blood pressure 113/74, pulse 88, temperature 98 F (36.7 C), temperature source Oral, resp. rate 16, SpO2 99%.  Wt Readings from Last 3 Encounters:  04/17/24 243 lb (110.2 kg)  03/18/24 243 lb (110.2 kg)  01/28/24 242 lb 15.9 oz (110.2 kg)    There is no height or weight on file to calculate BMI.  Performance status (ECOG): 2 - Symptomatic, <50% confined to bed  PHYSICAL EXAM:   GENERAL:alert, no distress and comfortable SKIN: skin color, texture, turgor are normal, no rashes or significant lesions EYES: normal,  Conjunctiva are pink and non-injected, sclera clear LUNGS: clear to auscultation and percussion with normal breathing effort HEART: regular rate & rhythm and no murmurs and no lower extremity edema ABDOMEN:abdomen soft, non-tender and normal bowel sounds Musculoskeletal:B/L BKA with prosthesis NEURO: alert & oriented x 3 with fluent speech, no focal motor/sensory deficits  LABORATORY DATA:  I have reviewed the data as listed    Component Value Date/Time   NA 138 06/04/2024 1117   NA 143 08/29/2023 0000   K 4.0 06/04/2024 1117   CL 105  06/04/2024 1117   CO2 23 06/04/2024 1117   GLUCOSE 187 (H) 06/04/2024 1117   BUN 28 (H) 06/04/2024 1117   BUN 31 (A) 08/29/2023 0000   CREATININE 1.48 (H) 06/04/2024 1117   CREATININE 1.70 (H) 11/29/2020 1320   CALCIUM  8.8 (L) 06/04/2024 1117   PROT 6.5 06/04/2024 1117   PROT 6.0 08/05/2021 0906   ALBUMIN 2.6 (L) 06/04/2024 1117   ALBUMIN 3.5 (L) 08/05/2021 0906   AST 23 06/04/2024 1117   ALT 18 06/04/2024 1117   ALKPHOS 164 (H) 06/04/2024 1117   BILITOT 0.6 06/04/2024 1117   BILITOT 0.3 08/05/2021 0906   GFRNONAA 50 (L) 06/04/2024 1117   GFRNONAA 58 (L) 11/26/2019 1520   GFRAA 50 06/29/2020 0000   GFRAA 67 11/26/2019 1520   Lab Results  Component Value Date   WBC 6.0 06/04/2024   NEUTROABS 4.2 06/04/2024   HGB 12.0 (L) 06/04/2024   HCT 36.0 (L) 06/04/2024   MCV 93.3 06/04/2024   PLT 219 06/04/2024    Latest Reference Range & Units 06/04/24 11:17  Prostatic Specific Antigen 0.00 - 4.00 ng/mL <0.01    RADIOGRAPHIC STUDIES: I have personally reviewed the radiological images as listed and agreed with the findings in the report.  None new to review

## 2024-06-19 ENCOUNTER — Inpatient Hospital Stay (HOSPITAL_BASED_OUTPATIENT_CLINIC_OR_DEPARTMENT_OTHER): Admitting: Oncology

## 2024-06-19 VITALS — BP 113/74 | HR 88 | Temp 98.0°F | Resp 16

## 2024-06-19 DIAGNOSIS — I10 Essential (primary) hypertension: Secondary | ICD-10-CM

## 2024-06-19 DIAGNOSIS — D649 Anemia, unspecified: Secondary | ICD-10-CM

## 2024-06-19 DIAGNOSIS — Z794 Long term (current) use of insulin: Secondary | ICD-10-CM | POA: Diagnosis not present

## 2024-06-19 DIAGNOSIS — C61 Malignant neoplasm of prostate: Secondary | ICD-10-CM

## 2024-06-19 DIAGNOSIS — Z9189 Other specified personal risk factors, not elsewhere classified: Secondary | ICD-10-CM | POA: Diagnosis not present

## 2024-06-19 DIAGNOSIS — C7951 Secondary malignant neoplasm of bone: Secondary | ICD-10-CM | POA: Diagnosis not present

## 2024-06-19 DIAGNOSIS — E119 Type 2 diabetes mellitus without complications: Secondary | ICD-10-CM | POA: Diagnosis not present

## 2024-06-19 NOTE — Patient Instructions (Signed)
 [facility] at [facility] Discharge Instructions   You were seen and examined today by Dr. Davonna.  She reviewed the results of your lab work which are normal/stable.   We will see you back in 3 months. We will repeat lab work prior to this visit.    Return as scheduled.    Thank you for choosing [facility] at [facility] to provide your oncology and hematology care.  To afford each patient quality time with our provider, please arrive at least 15 minutes before your scheduled appointment time.   If you have a lab appointment with the Cancer Center please come in thru the Main Entrance and check in at the main information desk.  You need to re-schedule your appointment should you arrive 10 or more minutes late.  We strive to give you quality time with our providers, and arriving late affects you and other patients whose appointments are after yours.  Also, if you no show three or more times for appointments you may be dismissed from the clinic at the providers discretion.     Again, thank you for choosing [facility].  Our hope is that these requests will decrease the amount of time that you wait before being seen by our physicians.       _____________________________________________________________  Should you have questions after your visit to [facility], please contact our office at [phone] and follow the prompts.  Our office hours are 8:00 a.m. and 4:30 p.m. Monday - Friday.  Please note that voicemails left after 4:00 p.m. may not be returned until the following business day.  We are closed weekends and major holidays.  You do have access to a nurse 24-7, just call the main number to the clinic [phone] and do not press any options, hold on the line and a nurse will answer the phone.    For prescription refill requests, have your pharmacy contact our office and allow 72 hours.    Due to Covid, you will  need to wear a mask upon entering the hospital. If you do not have a mask, a mask will be given to you at the Main Entrance upon arrival. For doctor visits, patients may have 1 support person age 48 or older with them. For treatment visits, patients can not have anyone with them due to social distancing guidelines and our immunocompromised population.

## 2024-06-19 NOTE — Assessment & Plan Note (Signed)
 Patient is on long-term prednisone  use with risk for osteopenia Bone DEXA scan from 05/2022: Normal no osteopenia or osteoporosis  - Continue vitamin D  and calcium  - Will obtain a DEXA scan now and repeat every 2 years

## 2024-06-19 NOTE — Assessment & Plan Note (Addendum)
 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  Gleason score 4+5= 9 First-line: Long-term ADT plus definitive EBRT 08/2022 with elevated PSA, PSMA PET showed recurrence of the prostate. Started on darolutamide  and had seizures on it.  Changed to abiraterone  and prednisone .  Caris assure: TMB indeterminate and MSI indeterminate, tumor fraction low  - Patient tolerating abiraterone  and prednisone  well.  Denies fatigue, musculoskeletal pain.  Continue abiraterone  1000 mg and prednisone  5 mg daily  -Blood pressure under control today. -Labs reviewed: CMP: Stable, slightly elevated ALP from previous.  CBC: Hemoglobin of 12 and iron panel within normal limits  Return to clinic in 12 weeks with labs

## 2024-06-19 NOTE — Progress Notes (Signed)
 Patient is taking Zytiga as prescribed. He has not missed any doses and reports no side effects at this time.

## 2024-06-19 NOTE — Assessment & Plan Note (Addendum)
 Anemia likely multifactorial: Chronic kidney disease, prostate cancer Labs reviewed today iron panel within normal limits  - Continue to monitor at this time

## 2024-06-19 NOTE — Assessment & Plan Note (Addendum)
 Currently well-controlled with atenolol  and Lasix   - Continue atenolol  and Lasix 

## 2024-06-20 ENCOUNTER — Other Ambulatory Visit: Payer: Self-pay

## 2024-06-23 ENCOUNTER — Other Ambulatory Visit: Payer: Self-pay

## 2024-06-23 ENCOUNTER — Other Ambulatory Visit: Payer: Self-pay | Admitting: Pharmacy Technician

## 2024-06-23 NOTE — Progress Notes (Signed)
 Specialty Pharmacy Refill Coordination Note  [name] is a 73 y.o. male contacted today regarding refills of specialty medication(s) Abiraterone  Acetate (ZYTIGA )   Patient requested Delivery   Delivery date: 06/27/24   Verified address: [address]   [name] [address]   Medication will be filled on 06/26/24.

## 2024-06-24 NOTE — Telephone Encounter (Signed)
 Pts daughter picked up pt assistance of insulin  degludec

## 2024-06-26 ENCOUNTER — Other Ambulatory Visit: Payer: Self-pay

## 2024-07-01 DIAGNOSIS — E782 Mixed hyperlipidemia: Secondary | ICD-10-CM | POA: Diagnosis not present

## 2024-07-01 DIAGNOSIS — E559 Vitamin D deficiency, unspecified: Secondary | ICD-10-CM | POA: Diagnosis not present

## 2024-07-01 DIAGNOSIS — E1122 Type 2 diabetes mellitus with diabetic chronic kidney disease: Secondary | ICD-10-CM | POA: Diagnosis not present

## 2024-07-02 LAB — LAB REPORT - SCANNED
A1c: 7.6
Albumin, Urine POC: 30.8
Creatinine, POC: 87.9 mg/dL
EGFR: 45
Microalb Creat Ratio: 35

## 2024-07-07 DIAGNOSIS — H409 Unspecified glaucoma: Secondary | ICD-10-CM | POA: Diagnosis not present

## 2024-07-07 DIAGNOSIS — Z23 Encounter for immunization: Secondary | ICD-10-CM | POA: Diagnosis not present

## 2024-07-07 DIAGNOSIS — E782 Mixed hyperlipidemia: Secondary | ICD-10-CM | POA: Diagnosis not present

## 2024-07-07 DIAGNOSIS — I10 Essential (primary) hypertension: Secondary | ICD-10-CM | POA: Diagnosis not present

## 2024-07-07 DIAGNOSIS — N3281 Overactive bladder: Secondary | ICD-10-CM | POA: Diagnosis not present

## 2024-07-07 DIAGNOSIS — I739 Peripheral vascular disease, unspecified: Secondary | ICD-10-CM | POA: Diagnosis not present

## 2024-07-07 DIAGNOSIS — E113291 Type 2 diabetes mellitus with mild nonproliferative diabetic retinopathy without macular edema, right eye: Secondary | ICD-10-CM | POA: Diagnosis not present

## 2024-07-07 DIAGNOSIS — E1122 Type 2 diabetes mellitus with diabetic chronic kidney disease: Secondary | ICD-10-CM | POA: Diagnosis not present

## 2024-07-07 DIAGNOSIS — N1831 Chronic kidney disease, stage 3a: Secondary | ICD-10-CM | POA: Diagnosis not present

## 2024-07-07 DIAGNOSIS — R569 Unspecified convulsions: Secondary | ICD-10-CM | POA: Diagnosis not present

## 2024-07-07 DIAGNOSIS — I129 Hypertensive chronic kidney disease with stage 1 through stage 4 chronic kidney disease, or unspecified chronic kidney disease: Secondary | ICD-10-CM | POA: Diagnosis not present

## 2024-07-07 DIAGNOSIS — C61 Malignant neoplasm of prostate: Secondary | ICD-10-CM | POA: Diagnosis not present

## 2024-07-09 ENCOUNTER — Telehealth: Payer: Self-pay

## 2024-07-09 NOTE — Telephone Encounter (Signed)
 Called pt to let him know we have samples of Gemtesa  at the receptionist desk ready for pick up

## 2024-07-09 NOTE — Telephone Encounter (Signed)
 Patient needing samples of Gemtesa  to pick up.  Please advise.  Call:  [phone]

## 2024-07-18 ENCOUNTER — Other Ambulatory Visit (HOSPITAL_COMMUNITY): Payer: Self-pay

## 2024-07-28 ENCOUNTER — Other Ambulatory Visit: Payer: Self-pay

## 2024-07-30 ENCOUNTER — Other Ambulatory Visit: Payer: Self-pay

## 2024-07-30 NOTE — Progress Notes (Signed)
 Specialty Pharmacy Refill Coordination Note  [name] is a 73 y.o. male contacted today regarding refills of specialty medication(s) Abiraterone  Acetate (ZYTIGA )   Patient requested Delivery   Delivery date: 08/01/24   Verified address: [address]   [name] [address]   Medication will be filled on 07/31/24.

## 2024-08-08 ENCOUNTER — Telehealth: Payer: Self-pay | Admitting: Urology

## 2024-08-08 NOTE — Telephone Encounter (Signed)
 Patient calling the office for samples of medication:   1.  What medication and dosage are you requesting samples for? Gemtesa   2.  Are you currently out of this medication?  1 left for today

## 2024-08-08 NOTE — Telephone Encounter (Signed)
 Called and notified pt gemtesa  samples were placed upfront at the receptionist desk

## 2024-08-11 ENCOUNTER — Other Ambulatory Visit: Payer: Self-pay

## 2024-08-11 NOTE — Progress Notes (Signed)
 Specialty Pharmacy Ongoing Clinical Assessment Note  [name] is a 73 y.o. male who is being followed by the specialty pharmacy service for RxSp Oncology   Patient's specialty medication(s) reviewed today: Abiraterone  Acetate (ZYTIGA )   Missed doses in the last 4 weeks: 0   Patient/Caregiver did not have any additional questions or concerns.   Therapeutic benefit summary: Patient is achieving benefit   Adverse events/side effects summary: No adverse events/side effects   Patient's therapy is appropriate to: Continue    Goals Addressed             This Visit's Progress    Slow Disease Progression   On track    Patient is on track. Patient will maintain adherence. PSA 0.01 from 06/04/24 labs.           Follow up: 3 months  Shareese Macha M Apple Dearmas Specialty Pharmacist

## 2024-08-18 ENCOUNTER — Encounter: Payer: Self-pay | Admitting: Nurse Practitioner

## 2024-08-18 ENCOUNTER — Ambulatory Visit: Admitting: Nurse Practitioner

## 2024-08-18 ENCOUNTER — Ambulatory Visit (INDEPENDENT_AMBULATORY_CARE_PROVIDER_SITE_OTHER): Admitting: Nurse Practitioner

## 2024-08-18 VITALS — BP 116/74 | HR 93 | Wt 246.2 lb

## 2024-08-18 DIAGNOSIS — Z794 Long term (current) use of insulin: Secondary | ICD-10-CM | POA: Diagnosis not present

## 2024-08-18 DIAGNOSIS — E1159 Type 2 diabetes mellitus with other circulatory complications: Secondary | ICD-10-CM

## 2024-08-18 DIAGNOSIS — E559 Vitamin D deficiency, unspecified: Secondary | ICD-10-CM | POA: Diagnosis not present

## 2024-08-18 DIAGNOSIS — E782 Mixed hyperlipidemia: Secondary | ICD-10-CM

## 2024-08-18 DIAGNOSIS — Z7985 Long-term (current) use of injectable non-insulin antidiabetic drugs: Secondary | ICD-10-CM

## 2024-08-18 DIAGNOSIS — I10 Essential (primary) hypertension: Secondary | ICD-10-CM | POA: Diagnosis not present

## 2024-08-18 MED ORDER — PRODIGY NO CODING BLOOD GLUC VI STRP: ORAL_STRIP | 12 refills | Status: DC

## 2024-08-18 NOTE — Progress Notes (Signed)
 08/18/2024   Endocrinology follow-up note    Subjective:    Patient ID: [name], male    DOB: 11-18-1950, PCP Shona Norleen PEDLAR, MD   Past Medical History:  Diagnosis Date   Arthritis    Cerebrovascular disease    MRI shows Right carotid inferior cavernours narrowing 75% and  50-75% stenosis of Cavernous and supraclinoi right side   CKD (chronic kidney disease) 06/28/2014   Sees Dr Perri   Diabetic retinopathy [facility])    Hyperlipidemia    Hypertension    Myocardial infarction [facility])    mild heart attack   Necrosis (HCC)    #2 nail    Pneumonia    as a child   Poor circulation of extremity    Prostate cancer (HCC) 2017   Prostate   Sleep apnea    uses cpap, getting a new one   Type 2 Diabetes mellitus    Type 2   Past Surgical History:  Procedure Laterality Date   ABOVE KNEE LEG AMPUTATION Left 2004   started out below knee and then extended to above knee due to poor healing   AMPUTATION Right 04/28/2015   Procedure: AMPUTATION RAY, RIGHT 5TH TOE;  Surgeon: Oneil JAYSON Herald, MD;  Location: [facility] OR;  Service: Orthopedics;  Laterality: Right;   AMPUTATION Right 05/10/2015   Procedure: Right Below Knee Amputation;  Surgeon: Oneil JAYSON Herald, MD;  Location: [facility] OR;  Service: Orthopedics;  Laterality: Right;   CATARACT EXTRACTION W/PHACO  09/05/2012   Procedure: CATARACT EXTRACTION PHACO AND INTRAOCULAR LENS PLACEMENT (IOC);  Surgeon: Cherene Mania, MD;  Location: [facility] ORS;  Service: Ophthalmology;  Laterality: Left;  CDE=5.45   CATARACT EXTRACTION W/PHACO  10/03/2012   Procedure: CATARACT EXTRACTION PHACO AND INTRAOCULAR LENS PLACEMENT (IOC);  Surgeon: Cherene Mania, MD;  Location: [facility] ORS;  Service: Ophthalmology;  Laterality: Right;  CDE: 12.31   COLONOSCOPY  2011   Dr. Debrah: colon polyps, tubular adenoma   COLONOSCOPY N/A 11/01/2018   Dr. Shaaron: Blood noted in the rectal vault.  Vascular pattern of the rectum abnormal, neovascular changes distally and actively oozing.  There were 3 2 to  5 mm polyps in the splenic flexure, cecum removed.  Cecal polyp was not recovered.  Radiation proctitis status post APC treatment.  The splenic flexure polyps were tubular adenomas.  Next colonoscopy in 5 years.   COLONOSCOPY WITH PROPOFOL  N/A 01/28/2024   Procedure: COLONOSCOPY WITH PROPOFOL ;  Surgeon: Cindie Carlin POUR, DO;  Location: [facility];  Service: Endoscopy;  Laterality: N/A;  7:30AM, ASA 3   FLEXIBLE SIGMOIDOSCOPY N/A 10/27/2019   Procedure: FLEXIBLE SIGMOIDOSCOPY;  Surgeon: Harvey Margo LITTIE, MD; rectal bleeding due to radiation proctitis s/p APC therapy (actively bleeding during exam), rectosigmoid colon and sigmoid colon appeared normal.   FLEXIBLE SIGMOIDOSCOPY N/A 03/13/2022   Procedure: FLEXIBLE SIGMOIDOSCOPY;  Surgeon: Cindie Carlin POUR, DO;  Location[facility];  Service: Endoscopy;  Laterality: N/A;  1:45pm   HOT HEMOSTASIS  03/13/2022   Procedure: HOT HEMOSTASIS (ARGON PLASMA COAGULATION/BICAP);  Surgeon: Cindie Carlin POUR, DO;  Location: [facility];  Service: Endoscopy;;   POLYPECTOMY  11/01/2018   Procedure: POLYPECTOMY;  Surgeon: Shaaron Lamar HERO, MD;  Location: [facility];  Service: Endoscopy;;  cecum,splenic flexure   POLYPECTOMY  01/28/2024   Procedure: POLYPECTOMY;  Surgeon: Cindie Carlin POUR, DO;  Location: [facility];  Service: Endoscopy;;   REFRACTIVE SURGERY Left 08/01/2021   Cleaned cataract   Social History  Socioeconomic History   Marital status: Married    Spouse name: Not on file   Number of children: 2   Years of education: college   Highest education level: Not on file  Occupational History   Occupation: Engineer, materials: [employer]  Tobacco Use   Smoking status: Never    Passive exposure: Never   Smokeless tobacco: Never  Vaping Use   Vaping status: Never Used  Substance and Sexual Activity   Alcohol use: No   Drug use: No   Sexual activity: Not Currently  Other Topics Concern   Not on file  Social History  Narrative   Patient lives with his wife Patient right handedPatient drinks caffine on occ.   Pt retired    Teacher, early years/pre Strain: Low Risk  (07/05/2023)   Received from [facility]   Overall Financial Resource Strain (CARDIA)    Difficulty of Paying Living Expenses: Not hard at all  Food Insecurity: No Food Insecurity (07/05/2023)   Received from [facility]   Hunger Vital Sign    Within the past 12 months, you worried that your food would run out before you got the money to buy more.: Never true    Within the past 12 months, the food you bought just didn't last and you didn't have money to get more.: Never true  Transportation Needs: No Transportation Needs (07/05/2023)   Received from [facility]   PRAPARE - Transportation    Lack of Transportation (Medical): No    Lack of Transportation (Non-Medical): No  Physical Activity: Sufficiently Active (07/05/2023)   Received from [facility]   Exercise Vital Sign    On average, how many days per week do you engage in moderate to strenuous exercise (like a brisk walk)?: 6 days    On average, how many minutes do you engage in exercise at this level?: 30 min  Stress: No Stress Concern Present (07/05/2023)   Received from [facility] - Occupational Stress Questionnaire    Feeling of Stress : Not at all  Social Connections: Socially Integrated (07/05/2023)   Received from [facility]   Social Connection and Isolation Panel    In a typical week, how many times do you talk on the phone with family, friends, or neighbors?: More than three times a week    How often do you get together with friends or relatives?: More than three times a week    How often do you attend church or religious services?: More than 4 times per year    Do you belong to any clubs or organizations such as church groups, unions, fraternal or athletic groups, or school groups?: Yes    How  often do you attend meetings of the clubs or organizations you belong to?: More than 4 times per year    Are you married, widowed, divorced, separated, never married, or living with a partner?: Married   Outpatient Encounter Medications as of 08/18/2024  Medication Sig   abiraterone  acetate (ZYTIGA ) 250 MG tablet Take 4 tablets (1,000 mg total) by mouth daily. Take on an empty stomach 1 hour before or 2 hours after a meal   acetaminophen  (TYLENOL ) 325 MG tablet Take 650 mg by mouth daily as needed for mild pain, moderate pain or headache.   atenolol  (TENORMIN ) 25 MG tablet TAKE 1 TABLET BY MOUTH EVERY DAY  atorvastatin  (LIPITOR) 20 MG tablet Take 20 mg by mouth daily.   B-D UF III MINI PEN NEEDLES 31G X 5 MM MISC USE FOUR TIMES DAILY AS DIRECTED   Calcium  Carb-Cholecalciferol 600-500 MG-UNIT CAPS Take 2 capsules by mouth daily.    clopidogrel  (PLAVIX ) 75 MG tablet TAKE 1 TABLET BY MOUTH EVERY DAY   furosemide  (LASIX ) 20 MG tablet Take 20 mg by mouth 3 (three) times a week.   glucose blood (PRODIGY NO CODING BLOOD GLUC) test strip Use as instructed to monitor glucose twice daily   insulin  degludec (TRESIBA  FLEXTOUCH) 100 UNIT/ML FlexTouch Pen INJECT 20 UNITS INTO THE SKIN AT BEDTIME. (Patient taking differently: Inject 26 Units into the skin at bedtime.)   latanoprost (XALATAN) 0.005 % ophthalmic solution Place 1 drop into both eyes at bedtime.   levETIRAcetam  (KEPPRA ) 250 MG tablet TAKE 1 TABLET BY MOUTH TWICE A DAY   predniSONE  (DELTASONE ) 5 MG tablet TAKE 1 TABLET BY MOUTH EVERY DAY WITH BREAKFAST   prochlorperazine  (COMPAZINE ) 10 MG tablet Take 1 tablet (10 mg total) by mouth every 6 (six) hours as needed for nausea or vomiting.   ROCKLATAN 0.02-0.005 % SOLN    Semaglutide , 1 MG/DOSE, 4 MG/3ML SOPN Inject 1 mg as directed once a week.   Vibegron  (GEMTESA ) 75 MG TABS Take 1 tablet (75 mg total) by mouth daily.   Vibegron  (GEMTESA ) 75 MG TABS Take 1 tablet (75 mg total) by mouth daily.    [DISCONTINUED] CONTOUR TEST test strip TEST TWICE DAILY E11.65   Facility-Administered Encounter Medications as of 08/18/2024  Medication   leuprolide  (6 Month) (ELIGARD ) injection 45 mg   ALLERGIES: Allergies  Allergen Reactions   Zolpidem Tartrate Other (See Comments)    disorientation    VACCINATION STATUS: Immunization History  Administered Date(s) Administered   Fluad Quad(high Dose 65+) 07/01/2019   INFLUENZA, HIGH DOSE SEASONAL PF 08/21/2023   Influenza Split 06/30/2013, 08/30/2013, 08/23/2020   Influenza-Unspecified 08/28/2003, 07/30/2015, 06/30/2018   Moderna Sars-Covid-2 Vaccination 11/20/2019, 12/17/2019   Pneumococcal Conjugate-13 01/08/2018   Pneumococcal Polysaccharide-23 07/31/2007, 08/26/2019   Td 10/30/2002   Tdap 06/19/2016   Zoster, Live 06/19/2016    Diabetes He presents for his follow-up diabetic visit. He has type 2 diabetes mellitus. Onset time: He was diagnosed at approximate age of 41 years. His disease course has been stable. There are no hypoglycemic associated symptoms. Pertinent negatives for hypoglycemia include no confusion, pallor or seizures. There are no diabetic associated symptoms. Pertinent negatives for diabetes include no fatigue, no polydipsia, no polyphagia, no polyuria and no weakness. There are no hypoglycemic complications. Symptoms are stable. Diabetic complications include heart disease and nephropathy. (Status post bilateral lower extremity amputations with prosthetics.) Risk factors for coronary artery disease include diabetes mellitus, dyslipidemia, hypertension, male sex, obesity, sedentary lifestyle and tobacco exposure. Current diabetic treatment includes insulin  injections (and Ozempic ). He is compliant with treatment most of the time. His weight is fluctuating minimally. He is following a diabetic diet. When asked about meal planning, he reported none. He has had a previous visit with a dietitian. He participates in exercise daily (5  x per week). His home blood glucose trend is fluctuating minimally. His overall blood glucose range is 110-130 mg/dl. (He presents today, accompanied by his wife, with his meter showing at target glycemic profile overall.  His most recent A1c on 9/3 done by his PCP was 7.6%, increasing slightly from last visit of 7.4%.  His PCP did increase his Tresiba  to 26 units  nightly due to the steroids.  He has glucose ranging between 82-184.  His 7-day average is 109, 14-day average of 108, 30-day average of 109.) An ACE inhibitor/angiotensin II receptor blocker is not being taken. He does not see a podiatrist.Eye exam is current.    Review of systems  Constitutional: + Minimally fluctuating body weight,  current Body mass index is 34.34 kg/m. , no fatigue, no subjective hyperthermia, no subjective hypothermia Eyes: no blurry vision, no xerophthalmia ENT: no sore throat, no nodules palpated in throat, no dysphagia/odynophagia, no hoarseness Cardiovascular: no chest pain, no shortness of breath, no palpitations Respiratory: no cough, no shortness of breath Gastrointestinal: no nausea/vomiting/diarrhea Musculoskeletal: no muscle/joint aches, walks with walker d/t BLE amputations with prosthesis Skin: no rashes, no hyperemia Neurological: no tremors, no numbness, no tingling, no dizziness Psychiatric: no depression, no anxiety    Objective:    BP 116/74 (BP Location: Right Arm, Patient Position: Sitting, Cuff Size: Large)   Pulse 93   Wt 246 lb 3.2 oz (111.7 kg)   BMI 34.34 kg/m   Wt Readings from Last 3 Encounters:  08/18/24 246 lb 3.2 oz (111.7 kg)  04/17/24 243 lb (110.2 kg)  03/18/24 243 lb (110.2 kg)    BP Readings from Last 3 Encounters:  08/18/24 116/74  06/19/24 113/74  04/17/24 102/62    Physical Exam- Limited  Constitutional:  Body mass index is 34.34 kg/m., not in acute distress, normal state of mind Eyes:  EOMI, no exophthalmos Musculoskeletal: walks with walker d/t BLE  amputations with prosthesis Skin:  no rashes, no hyperemia Neurological: no tremor with outstretched hands    Results for orders placed or performed in visit on 07/07/24  Lab report - scanned   Collection Time: 07/02/24 10:04 AM  Result Value Ref Range   A1c 7.6    Creatinine, POC 87.9 mg/dL   Albumin, Urine POC 69.1    Microalb Creat Ratio 35    EGFR 45.0    Complete Blood Count (Most recent): Lab Results  Component Value Date   WBC 6.0 06/04/2024   HGB 12.0 (L) 06/04/2024   HCT 36.0 (L) 06/04/2024   MCV 93.3 06/04/2024   PLT 219 06/04/2024   Chemistry (most recent): Lab Results  Component Value Date   NA 138 06/04/2024   K 4.0 06/04/2024   CL 105 06/04/2024   CO2 23 06/04/2024   BUN 28 (H) 06/04/2024   CREATININE 1.48 (H) 06/04/2024   Diabetic Labs (most recent): Lab Results  Component Value Date   HGBA1C 7.4 (A) 04/17/2024   HGBA1C 6.1 08/29/2023   HGBA1C 6.5 (A) 08/15/2022   MICROALBUR 30 08/06/2020   MICROALBUR 0.2 10/31/2016   Lipid Panel     Component Value Date/Time   CHOL 125 08/29/2023 0000   CHOL 113 01/31/2021 0937   TRIG 75 08/29/2023 0000   TRIG 98 10/27/2013 1305   HDL 50 08/29/2023 0000   HDL 47 01/31/2021 0937   HDL 39 (L) 10/27/2013 1305   CHOLHDL 2.4 01/31/2021 0937   CHOLHDL 3.0 02/24/2020 0915   VLDL 14 10/31/2016 1137   LDLCALC 60 08/29/2023 0000   LDLCALC 51 01/31/2021 0937   LDLCALC 50 02/24/2020 0915   LDLCALC 82 10/27/2013 1305    Assessment & Plan:   1) Type 2 diabetes mellitus with other circulatory complications (HCC)  -His diabetes is complicated by extensive peripheral arterial disease with bilateral lower extremity amputations and CKD and he remains at a high risk for more  acute and chronic complications of diabetes which include CAD, CVA, CKD, retinopathy, and neuropathy. These are all discussed in detail with the patient.  He presents today, accompanied by his wife, with his meter showing at target glycemic profile  overall.  His most recent A1c on 9/3 done by his PCP was 7.6%, increasing slightly from last visit of 7.4%.  His PCP did increase his Tresiba  to 26 units nightly due to the steroids.  He has glucose ranging between 82-184.  His 7-day average is 109, 14-day average of 108, 30-day average of 109.   -Recent labs reviewed.  - Nutritional counseling repeated at each appointment due to patients tendency to fall back in to old habits.  - The patient admits there is a room for improvement in their diet and drink choices. -  Suggestion is made for the patient to avoid simple carbohydrates from their diet including Cakes, Sweet Desserts / Pastries, Ice Cream, Soda (diet and regular), Sweet Tea, Candies, Chips, Cookies, Sweet Pastries, Store Bought Juices, Alcohol in Excess of 1-2 drinks a day, Artificial Sweeteners, Coffee Creamer, and Sugar-free Products. This will help patient to have stable blood glucose profile and potentially avoid unintended weight gain.   - I encouraged the patient to switch to unprocessed or minimally processed complex starch and increased protein intake (animal or plant source), fruits, and vegetables.   - Patient is advised to stick to a routine mealtimes to eat 3 meals a day and avoid unnecessary snacks (to snack only to correct hypoglycemia).  - I have approached patient with the following individualized plan to manage diabetes and patient agrees.  -Given his stable glycemic profile, will continue current medication regimen.   -He is advised to continue Tresiba  26 units SQ nightly and Ozempic  to 2 mg SQ weekly (he thinks he is still taking the 1 mg dose but we did update his PAP form to the 2 mg just recently).  Ultimate goal is to maximize Ozempic  and get off basal insulin  in the future.    He is on oral prednisone , for unknown duration due to seizures.  -He is encouraged to continue monitoring blood glucose twice daily, before breakfast and before bed, and call the clinic if  he gets readings less than 70 or greater than 200 for 3 tests in a row.  - His insurance did not provide coverage for Ozempic  nor Trulicity  but he does have help with Patient assistance program.  2) Hypertension: His blood pressure is controlled to target.  He is advised to continue meds as prescribed by PCP.  3) Lipids/HPL:  His most recent lipid panel from 07/01/24 shows controlled LDL at 67.  He is advised to continue Lipitor 80 mg po daily at bedtime.  Side effects and precautions discussed with him.   - I advised patient to maintain close follow up with Shona Norleen PEDLAR, MD for primary care needs.      I spent  30  minutes in the care of the patient today including review of labs from CMP, Lipids, Thyroid  Function, Hematology (current and previous including abstractions from other facilities); face-to-face time discussing  his blood glucose readings/logs, discussing hypoglycemia and hyperglycemia episodes and symptoms, medications doses, his options of short and long term treatment based on the latest standards of care / guidelines;  discussion about incorporating lifestyle medicine;  and documenting the encounter. Risk reduction counseling performed per USPSTF guidelines to reduce obesity and cardiovascular risk factors.     Please refer to Patient  Instructions for Blood Glucose Monitoring and Insulin /Medications Dosing Guide  in media tab for additional information. Please  also refer to  Patient Self Inventory in the Media  tab for reviewed elements of pertinent patient history.  [name] participated in the discussions, expressed understanding, and voiced agreement with the above plans.  All questions were answered to his satisfaction. he is encouraged to contact clinic should he have any questions or concerns prior to his return visit.   Follow up plan: -Return in about 4 months (around 12/19/2024) for Diabetes F/U with A1c in office, No previsit labs, Bring meter and  logs.  Benton Rio, [facility] [address] [place], [place] [address] Phone: [phone] Fax: [phone]  08/18/2024, 4:14 PM

## 2024-08-19 DIAGNOSIS — H35033 Hypertensive retinopathy, bilateral: Secondary | ICD-10-CM | POA: Diagnosis not present

## 2024-08-20 ENCOUNTER — Other Ambulatory Visit: Payer: Self-pay

## 2024-08-20 ENCOUNTER — Other Ambulatory Visit: Payer: Self-pay | Admitting: *Deleted

## 2024-08-20 DIAGNOSIS — E1159 Type 2 diabetes mellitus with other circulatory complications: Secondary | ICD-10-CM

## 2024-08-20 DIAGNOSIS — Z794 Long term (current) use of insulin: Secondary | ICD-10-CM

## 2024-08-20 MED ORDER — ACCU-CHEK GUIDE TEST VI STRP
ORAL_STRIP | 4 refills | Status: DC
Start: 2024-08-20 — End: 2024-08-20

## 2024-08-20 MED ORDER — ACCU-CHEK GUIDE TEST VI STRP: ORAL_STRIP | 4 refills | Status: AC

## 2024-08-22 ENCOUNTER — Other Ambulatory Visit: Payer: Self-pay

## 2024-08-22 ENCOUNTER — Other Ambulatory Visit: Payer: Self-pay | Admitting: Hematology

## 2024-08-22 DIAGNOSIS — C61 Malignant neoplasm of prostate: Secondary | ICD-10-CM

## 2024-08-27 ENCOUNTER — Other Ambulatory Visit: Payer: Self-pay

## 2024-08-27 ENCOUNTER — Other Ambulatory Visit (HOSPITAL_COMMUNITY): Payer: Self-pay

## 2024-08-27 ENCOUNTER — Encounter: Payer: Self-pay | Admitting: Oncology

## 2024-08-27 MED ORDER — ABIRATERONE ACETATE 250 MG PO TABS
1000.0000 mg | ORAL_TABLET | Freq: Every day | ORAL | 2 refills | Status: DC
  Filled 2024-08-27 – 2024-09-21 (×3): qty 120, 30d supply, fill #0
  Filled 2024-10-13 – 2024-10-14 (×2): qty 120, 30d supply, fill #1
  Filled 2024-10-31: qty 120, 30d supply, fill #2

## 2024-08-27 NOTE — Telephone Encounter (Signed)
 Chart reviewed. Zytiga  refilled per last office note with Dr. Davonna.

## 2024-08-28 ENCOUNTER — Telehealth: Payer: Self-pay | Admitting: Nurse Practitioner

## 2024-08-28 NOTE — Telephone Encounter (Signed)
 Called and let wife know PAP for pt was ready for pick up

## 2024-09-11 ENCOUNTER — Other Ambulatory Visit: Payer: Self-pay

## 2024-09-15 ENCOUNTER — Inpatient Hospital Stay: Attending: Hematology

## 2024-09-15 ENCOUNTER — Other Ambulatory Visit (HOSPITAL_COMMUNITY)

## 2024-09-15 ENCOUNTER — Other Ambulatory Visit: Payer: Self-pay

## 2024-09-15 DIAGNOSIS — C779 Secondary and unspecified malignant neoplasm of lymph node, unspecified: Secondary | ICD-10-CM | POA: Diagnosis not present

## 2024-09-15 DIAGNOSIS — C7951 Secondary malignant neoplasm of bone: Secondary | ICD-10-CM | POA: Diagnosis not present

## 2024-09-15 DIAGNOSIS — Z79899 Other long term (current) drug therapy: Secondary | ICD-10-CM | POA: Diagnosis not present

## 2024-09-15 DIAGNOSIS — D649 Anemia, unspecified: Secondary | ICD-10-CM | POA: Diagnosis not present

## 2024-09-15 DIAGNOSIS — R569 Unspecified convulsions: Secondary | ICD-10-CM | POA: Diagnosis not present

## 2024-09-15 DIAGNOSIS — C61 Malignant neoplasm of prostate: Secondary | ICD-10-CM

## 2024-09-15 DIAGNOSIS — Z7952 Long term (current) use of systemic steroids: Secondary | ICD-10-CM | POA: Diagnosis not present

## 2024-09-15 LAB — CBC WITH DIFFERENTIAL/PLATELET
Abs Immature Granulocytes: 0.04 K/uL (ref 0.00–0.07)
Basophils Absolute: 0 K/uL (ref 0.0–0.1)
Basophils Relative: 0 %
Eosinophils Absolute: 0.1 K/uL (ref 0.0–0.5)
Eosinophils Relative: 1 %
HCT: 33.5 % — ABNORMAL LOW (ref 39.0–52.0)
Hemoglobin: 10.9 g/dL — ABNORMAL LOW (ref 13.0–17.0)
Immature Granulocytes: 1 %
Lymphocytes Relative: 16 %
Lymphs Abs: 1.1 K/uL (ref 0.7–4.0)
MCH: 30.8 pg (ref 26.0–34.0)
MCHC: 32.5 g/dL (ref 30.0–36.0)
MCV: 94.6 fL (ref 80.0–100.0)
Monocytes Absolute: 0.4 K/uL (ref 0.1–1.0)
Monocytes Relative: 7 %
Neutro Abs: 5 K/uL (ref 1.7–7.7)
Neutrophils Relative %: 75 %
Platelets: 215 K/uL (ref 150–400)
RBC: 3.54 MIL/uL — ABNORMAL LOW (ref 4.22–5.81)
RDW: 13.7 % (ref 11.5–15.5)
WBC: 6.6 K/uL (ref 4.0–10.5)
nRBC: 0 % (ref 0.0–0.2)

## 2024-09-15 LAB — COMPREHENSIVE METABOLIC PANEL WITH GFR
ALT: 16 U/L (ref 0–44)
AST: 22 U/L (ref 15–41)
Albumin: 3.2 g/dL — ABNORMAL LOW (ref 3.5–5.0)
Alkaline Phosphatase: 158 U/L — ABNORMAL HIGH (ref 38–126)
Anion gap: 8 (ref 5–15)
BUN: 22 mg/dL (ref 8–23)
CO2: 27 mmol/L (ref 22–32)
Calcium: 8.6 mg/dL — ABNORMAL LOW (ref 8.9–10.3)
Chloride: 107 mmol/L (ref 98–111)
Creatinine, Ser: 1.41 mg/dL — ABNORMAL HIGH (ref 0.61–1.24)
GFR, Estimated: 53 mL/min — ABNORMAL LOW (ref >60)
Glucose, Bld: 134 mg/dL — ABNORMAL HIGH (ref 70–99)
Potassium: 4 mmol/L (ref 3.5–5.1)
Sodium: 143 mmol/L (ref 135–145)
Total Bilirubin: 0.5 mg/dL (ref 0.0–1.2)
Total Protein: 6.1 g/dL — ABNORMAL LOW (ref 6.5–8.1)

## 2024-09-15 LAB — IRON AND TIBC
Iron: 90 ug/dL (ref 45–182)
Saturation Ratios: 41 % — ABNORMAL HIGH (ref 17.9–39.5)
TIBC: 220 ug/dL — ABNORMAL LOW (ref 250–450)
UIBC: 130 ug/dL

## 2024-09-15 LAB — VITAMIN B12: Vitamin B-12: 411 pg/mL (ref 180–914)

## 2024-09-15 LAB — PSA: Prostatic Specific Antigen: <0.02 ng/mL (ref 0.00–4.00)

## 2024-09-15 LAB — FOLATE: Folate: 8.1 ng/mL (ref >5.9)

## 2024-09-15 LAB — MAGNESIUM: Magnesium: 2 mg/dL (ref 1.7–2.4)

## 2024-09-15 LAB — FERRITIN: Ferritin: 327 ng/mL (ref 24–336)

## 2024-09-17 ENCOUNTER — Ambulatory Visit (HOSPITAL_COMMUNITY)
Admission: RE | Admit: 2024-09-17 | Discharge: 2024-09-17 | Disposition: A | Discharge status: Home / Self Care | Source: Ambulatory Visit | Attending: Hematology | Admitting: Hematology

## 2024-09-17 ENCOUNTER — Other Ambulatory Visit (HOSPITAL_COMMUNITY): Payer: Self-pay

## 2024-09-17 DIAGNOSIS — Z79890 Hormone replacement therapy (postmenopausal): Secondary | ICD-10-CM | POA: Diagnosis not present

## 2024-09-17 DIAGNOSIS — Z09 Encounter for follow-up examination after completed treatment for conditions other than malignant neoplasm: Secondary | ICD-10-CM | POA: Insufficient documentation

## 2024-09-17 DIAGNOSIS — D509 Iron deficiency anemia, unspecified: Secondary | ICD-10-CM | POA: Diagnosis not present

## 2024-09-17 DIAGNOSIS — C61 Malignant neoplasm of prostate: Secondary | ICD-10-CM | POA: Diagnosis not present

## 2024-09-17 DIAGNOSIS — M810 Age-related osteoporosis without current pathological fracture: Secondary | ICD-10-CM | POA: Insufficient documentation

## 2024-09-18 NOTE — Telephone Encounter (Signed)
 Called pt again to let them know PAP needs to be picked up because more was delivered today. She will have someone try to come 11/21

## 2024-09-18 NOTE — Telephone Encounter (Signed)
 PAP of ozempic  and insulin  was picked up

## 2024-09-21 NOTE — Progress Notes (Unsigned)
 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 male with castration refractory prostate cancer, T3 N1 M0 and anemia.   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  Gleason score 4+5= 9 First-line: Long-term ADT plus definitive EBRT 08/2022 with elevated PSA, PSMA PET showed recurrence of the prostate. Started on darolutamide  and had seizures on it.  Changed to abiraterone  and prednisone .  Caris assure: TMB indeterminate and MSI indeterminate, tumor fraction low   - Patient tolerating abiraterone  and prednisone  well.  Denies fatigue, musculoskeletal pain.  -Blood pressure under control today. -Labs reviewed: CMP: Creatinine: 1.41-stable, calcium : 8.6, alkaline phosphatase: 158.   CBC: Hemoglobin: 10.9, normal WBC and platelets. -Continue abiraterone  1000 mg and prednisone  5 mg daily    Return to clinic in 12 weeks with labs  At risk for decreased bone density Patient is on long-term prednisone  use with risk for osteopenia Bone DEXA scan from 05/2022: Normal no osteopenia or osteoporosis DEXA scan 09/17/2024: normal BMD   - Continue vitamin D  and calcium  - Will repeat DEXA scan every 2 years  Anemia, unspecified type Anemia likely multifactorial: Chronic kidney disease, prostate cancer, abiraterone  use Labs reviewed today iron panel within normal limits B12: 411   - Continue to  monitor at this time - Recommended to start taking vitamin B12 supplementation daily  Essential hypertension Currently well-controlled with atenolol  and Lasix    - Continue atenolol  and Lasix    The patient understands the plans discussed today and is in agreement with them.  He knows to contact our office if he develops concerns prior to his next appointment.  The total time spent in the appointment was 18 minutes for the encounter with patient, including review of chart and various tests results, discussions about plan of care and coordination of care plan   [name], MD  [facility] [place] - A DEPT OF [facility] [address] [place] [address] Dept: [phone] Dept Fax: [phone]   No orders of the defined types were placed in this encounter.    ONCOLOGY HISTORY:   I have reviewed his chart and materials related to his cancer extensively and collaborated history with the patient. Summary of oncologic history is as follows:   Diagnosis: Castration refractory prostate cancer, T3 N1 M0   -03/30/2017: PSA 9.8 -04/27/2017: Multiple US -guided Prostate biopsies.  Pathology: Prostatic adenocarcinoma involving left and right globes. Gleason Score 4+5=9. Perineural invasion identified in left base lateral and left mid lateral portions of the prostate.  -08/27/2017-10/26/2017: long-term ADT plus definitive EBRT completed -03/19/2020 to current: Eligard  injections every 6 months -09/01/2021: PSA <0.1 -03/09/2022: PSA 0.2 -07/05/2022: PSA 0.6 -09/19/2022: PSA 0.9 -12/14/2022: PSMA PET (done for rising PSA): Marked radiotracer uptake within the prostate gland compatible with residual/recurrent prostate cancer. No convincing evidence of radiotracer avid nodal or distant metastatic disease. -12/28/2022: PSA 1.8 -01/16/2023 to 10/11/2023: Darolutamide  600  mg twice daily, discontinued as patient developed  seizures -02/08/2023: Invitae germline: MSH 3 heterozygous VUS. No pathogenic variants detected.  -03/21/2023: Caris Assure: No pathogenic tumor derived somatic variants detected. TMB indeterminate. MSI indeterminate. Most likely low tumor circulating cells.  -11/13/2023 - current: Abiraterone  1000 mg daily and prednisone  5 mg daily  Current Treatment:  Abiraterone  1000 mg and prednisone  5 mg daily  INTERVAL HISTORY:   Discussed the use of AI scribe software for clinical note transcription with the patient, who gave verbal consent to proceed.  History of Present Illness [name] is a 73 year old male with prostate cancer who presents for routine follow-up. He is accompanied by his wife today.   He experiences occasional muscle cramps on his sides, which improve with rubbing alcohol or changing position. No joint pains or significant muscle pains are reported.  He notes that he eats what his wife prepares for him for the most part. He is on Zytiga . He reports that his hemoglobin has decreased by one unit, and that an anemia workup was performed and came back negative. His vitamin B12 level is 411.  He reports a good appetite and notes a weight loss from 243 pounds to 237 pounds, despite not significantly changing his diet. He is trying to eat better, which is reflected in his improved protein levels.  No nausea, vomiting, rash, joint pains, or significant muscle pains. Occasional muscle cramps and a good appetite are noted. He reports a weight loss of 6 pounds. No issues with memory beyond what he attributes to age.   I have reviewed the past medical history, past surgical history, social history and family history with the patient and they are unchanged from previous note.  ALLERGIES:  is allergic to zolpidem tartrate.  MEDICATIONS:  Current Outpatient Medications  Medication Sig Dispense Refill   abiraterone  acetate (ZYTIGA ) 250 MG tablet Take 4 tablets (1,000 mg total) by mouth  daily. Take on an empty stomach 1 hour before or 2 hours after a meal 120 tablet 2   acetaminophen  (TYLENOL ) 325 MG tablet Take 650 mg by mouth daily as needed for mild pain, moderate pain or headache.     atenolol  (TENORMIN ) 25 MG tablet TAKE 1 TABLET BY MOUTH EVERY DAY 90 tablet 1   atorvastatin  (LIPITOR) 20 MG tablet Take 20 mg by mouth daily.     B-D UF III MINI PEN NEEDLES 31G X 5 MM MISC USE FOUR TIMES DAILY AS DIRECTED 150 each 2   Calcium  Carb-Cholecalciferol 600-500 MG-UNIT CAPS Take 2 capsules by mouth daily.      clopidogrel  (PLAVIX ) 75 MG tablet TAKE 1 TABLET BY MOUTH EVERY DAY 90 tablet 1   furosemide  (LASIX ) 20 MG tablet Take 20 mg by mouth 3 (three) times a week.     glucose blood (ACCU-CHEK GUIDE TEST) test strip Use 1 test strip to check blood sugar twice a day as directed by provider. 200 each 4   insulin  degludec (TRESIBA  FLEXTOUCH) 100 UNIT/ML FlexTouch Pen INJECT 20 UNITS INTO THE SKIN AT BEDTIME. (Patient taking differently: Inject 26 Units into the skin at bedtime.) 30 mL 0   latanoprost (XALATAN) 0.005 % ophthalmic solution Place 1 drop into both eyes at bedtime.     levETIRAcetam  (KEPPRA ) 250 MG tablet TAKE 1 TABLET BY MOUTH TWICE A DAY 180 tablet 2   predniSONE  (DELTASONE ) 5 MG tablet TAKE 1 TABLET BY MOUTH EVERY DAY WITH BREAKFAST 30 tablet 5   prochlorperazine  (COMPAZINE ) 10 MG  tablet Take 1 tablet (10 mg total) by mouth every 6 (six) hours as needed for nausea or vomiting. 30 tablet 1   ROCKLATAN 0.02-0.005 % SOLN      Semaglutide , 1 MG/DOSE, 4 MG/3ML SOPN Inject 1 mg as directed once a week. 6 mL 3   Vibegron  (GEMTESA ) 75 MG TABS Take 1 tablet (75 mg total) by mouth daily. 30 tablet 11   Vibegron  (GEMTESA ) 75 MG TABS Take 1 tablet (75 mg total) by mouth daily.     Current Facility-Administered Medications  Medication Dose Route Frequency Provider Last Rate Last Admin   leuprolide  (6 Month) (ELIGARD ) injection 45 mg  45 mg Subcutaneous Q6 months [name], John, MD   45  mg at 04/10/24 1241    REVIEW OF SYSTEMS:   Constitutional: Denies fevers, chills or abnormal weight loss Eyes: Denies blurriness of vision Ears, nose, mouth, throat, and face: Denies mucositis or sore throat Respiratory: Denies dyspnea or wheezes Cardiovascular: Denies palpitation, chest discomfort or lower extremity swelling Gastrointestinal:  Denies nausea, heartburn or change in bowel habits Skin: Denies abnormal skin rashes Lymphatics: Denies new lymphadenopathy or easy bruising Neurological: Positive for numbness in hands, Denies tingling or new weaknesses Behavioral/Psych: Mood is stable, no new changes   All other systems were reviewed with the patient and are negative.   VITALS:  There were no vitals taken for this visit.  Wt Readings from Last 3 Encounters:  08/18/24 246 lb 3.2 oz (111.7 kg)  04/17/24 243 lb (110.2 kg)  03/18/24 243 lb (110.2 kg)    There is no height or weight on file to calculate BMI.  Performance status (ECOG): 2 - Symptomatic, <50% confined to bed  PHYSICAL EXAM:   GENERAL:alert, no distress and comfortable SKIN: skin color, texture, turgor are normal, no rashes or significant lesions EYES: normal, Conjunctiva are pink and non-injected, sclera clear LUNGS: clear to auscultation and percussion with normal breathing effort HEART: regular rate & rhythm and no murmurs and no lower extremity edema ABDOMEN:abdomen soft, non-tender and normal bowel sounds Musculoskeletal:B/L BKA with prosthesis NEURO: alert & oriented x 3 with fluent speech  LABORATORY DATA:  I have reviewed the data as listed    Component Value Date/Time   NA 143 09/15/2024 1021   NA 143 08/29/2023 0000   K 4.0 09/15/2024 1021   CL 107 09/15/2024 1021   CO2 27 09/15/2024 1021   GLUCOSE 134 (H) 09/15/2024 1021   BUN 22 09/15/2024 1021   BUN 31 (A) 08/29/2023 0000   CREATININE 1.41 (H) 09/15/2024 1021   CREATININE 1.70 (H) 11/29/2020 1320   CALCIUM  8.6 (L) 09/15/2024 1021    PROT 6.1 (L) 09/15/2024 1021   PROT 6.0 08/05/2021 0906   ALBUMIN 3.2 (L) 09/15/2024 1021   ALBUMIN 3.5 (L) 08/05/2021 0906   AST 22 09/15/2024 1021   ALT 16 09/15/2024 1021   ALKPHOS 158 (H) 09/15/2024 1021   BILITOT 0.5 09/15/2024 1021   BILITOT 0.3 08/05/2021 0906   GFRNONAA 53 (L) 09/15/2024 1021   GFRNONAA 58 (L) 11/26/2019 1520   GFRAA 50 06/29/2020 0000   GFRAA 67 11/26/2019 1520   Lab Results  Component Value Date   WBC 6.6 09/15/2024   NEUTROABS 5.0 09/15/2024   HGB 10.9 (L) 09/15/2024   HCT 33.5 (L) 09/15/2024   MCV 94.6 09/15/2024   PLT 215 09/15/2024    Latest Reference Range & Units 09/15/24 10:21  Iron 45 - 182 ug/dL 90  UIBC ug/dL 869  TIBC 250 - 450 ug/dL 779 (L)  Saturation Ratios 17.9 - 39.5 % 41 (H)  Ferritin 24 - 336 ng/mL 327  Folate >5.9 ng/mL 8.1  Vitamin B12 180 - 914 pg/mL 411  (L): Data is abnormally low (H): Data is abnormally high   Latest Reference Range & Units 09/15/24 10:21  Prostatic Specific Antigen 0.00 - 4.00 ng/mL <0.02   RADIOGRAPHIC STUDIES: I have personally reviewed the radiological images as listed and agreed with the findings in the report.  DG Bone Density EXAM: DUAL X-RAY ABSORPTIOMETRY (DXA) FOR BONE MINERAL DENSITY 09/17/2024 10:39 am  CLINICAL DATA:  73 year old Male Testosterone  blocking therapy  TECHNIQUE: An axial (e.g., hips, spine) and/or appendicular (e.g., radius) exam was performed, as appropriate, using [employer] [employer] [facility]. Images are obtained for bone mineral density measurement and are not obtained for diagnostic purposes. [id number]  Exclusions: Lumbar spine due to advanced degenerative changes; left hip due to prosthesis.  COMPARISON:  None.  FINDINGS: Scan quality: Good.  RIGHT FEMORAL NECK:  BMD (in g/cm2): 1.081  T-score: 0.1  Z-score: 0.4  RIGHT TOTAL HIP:  BMD (in g/cm2): 1.112  T-score: 0.1  Z-score: -0.1  LEFT FOREARM (RADIUS  33%):  BMD (in g/cm2): 0.808  T-score: 0.2  Z-score: 0.3  FRAX 10-YEAR PROBABILITY OF FRACTURE:  FRAX not reported as the lowest BMD is not in the osteopenia range.  IMPRESSION: Normal based on BMD.  Fracture risk is not increased.  RECOMMENDATIONS: 1. All patients should optimize calcium  and vitamin D  intake.  2. Consider FDA-approved medical therapies in postmenopausal women and men aged 8 years and older, based on the following:  - A hip or vertebral (clinical or morphometric) fracture  - T-score less than or equal to -2.5 and secondary causes have been excluded.  - Low bone mass (T-score between -1.0 and -2.5) and a 10-year probability of a hip fracture greater than or equal to 3% or a 10-year probability of a major osteoporosis-related fracture greater than or equal to 20% based on the [place] -adapted WHO algorithm.  - Clinician judgment and/or patient preferences may indicate treatment for people with 10-year fracture probabilities above or below these levels  3. Patients with diagnosis of osteoporosis or at high risk for fracture should have regular bone mineral density tests. For patients eligible for Medicare, routine testing is allowed once every 2 years. The testing frequency can be increased to one year for patients who have rapidly progressing disease, those who are receiving or discontinuing medical therapy to restore bone mass, or have additional risk factors.  Electronically Signed   By: Harrietta Sherry M.D.   On: 09/17/2024 11:01

## 2024-09-22 ENCOUNTER — Inpatient Hospital Stay (HOSPITAL_BASED_OUTPATIENT_CLINIC_OR_DEPARTMENT_OTHER): Admitting: Oncology

## 2024-09-22 ENCOUNTER — Other Ambulatory Visit: Payer: Self-pay

## 2024-09-22 ENCOUNTER — Other Ambulatory Visit: Payer: Self-pay | Admitting: Pharmacy Technician

## 2024-09-22 VITALS — BP 116/80 | HR 93 | Temp 97.8°F | Resp 18 | Wt 237.4 lb

## 2024-09-22 DIAGNOSIS — C61 Malignant neoplasm of prostate: Secondary | ICD-10-CM | POA: Diagnosis not present

## 2024-09-22 DIAGNOSIS — Z7952 Long term (current) use of systemic steroids: Secondary | ICD-10-CM | POA: Diagnosis not present

## 2024-09-22 DIAGNOSIS — D649 Anemia, unspecified: Secondary | ICD-10-CM | POA: Diagnosis not present

## 2024-09-22 DIAGNOSIS — I10 Essential (primary) hypertension: Secondary | ICD-10-CM | POA: Diagnosis not present

## 2024-09-22 DIAGNOSIS — C7951 Secondary malignant neoplasm of bone: Secondary | ICD-10-CM | POA: Diagnosis not present

## 2024-09-22 DIAGNOSIS — C779 Secondary and unspecified malignant neoplasm of lymph node, unspecified: Secondary | ICD-10-CM | POA: Diagnosis not present

## 2024-09-22 DIAGNOSIS — R569 Unspecified convulsions: Secondary | ICD-10-CM | POA: Diagnosis not present

## 2024-09-22 NOTE — Patient Instructions (Signed)

## 2024-09-22 NOTE — Progress Notes (Signed)
 Specialty Pharmacy Refill Coordination Note  [name] is a 73 y.o. male contacted today regarding refills of specialty medication(s) Abiraterone  Acetate (ZYTIGA )   Patient requested Delivery   Delivery date: 09/23/24   Verified address: [address]  [name]   Medication will be filled on: 09/22/24

## 2024-09-22 NOTE — Progress Notes (Signed)
 Patient is taking Zytiga as prescribed. He has not missed any doses and reports no side effects at this time.

## 2024-09-23 ENCOUNTER — Inpatient Hospital Stay: Admitting: Oncology

## 2024-10-03 ENCOUNTER — Telehealth: Payer: Self-pay | Admitting: Urology

## 2024-10-03 DIAGNOSIS — N3941 Urge incontinence: Secondary | ICD-10-CM

## 2024-10-03 MED ORDER — GEMTESA 75 MG PO TABS: 1.0000 | ORAL_TABLET | Freq: Every day | ORAL | Status: AC

## 2024-10-03 NOTE — Telephone Encounter (Signed)
 Return call to pt. Pt made aware samples will be upfront to pick up. Pt voiced understanding.

## 2024-10-13 ENCOUNTER — Other Ambulatory Visit: Payer: Self-pay

## 2024-10-13 ENCOUNTER — Ambulatory Visit: Admitting: Urology

## 2024-10-13 VITALS — BP 130/79 | HR 89

## 2024-10-13 DIAGNOSIS — C61 Malignant neoplasm of prostate: Secondary | ICD-10-CM | POA: Diagnosis not present

## 2024-10-13 DIAGNOSIS — N3281 Overactive bladder: Secondary | ICD-10-CM | POA: Diagnosis not present

## 2024-10-13 LAB — URINALYSIS, ROUTINE W REFLEX MICROSCOPIC
Bilirubin, UA: NEGATIVE
Glucose, UA: NEGATIVE
Ketones, UA: NEGATIVE
Nitrite, UA: NEGATIVE
Protein,UA: NEGATIVE
Specific Gravity, UA: 1.01 (ref 1.005–1.030)
Urobilinogen, Ur: 0.2 mg/dL (ref 0.2–1.0)
pH, UA: 6 (ref 5.0–7.5)

## 2024-10-13 LAB — MICROSCOPIC EXAMINATION: Bacteria, UA: NONE SEEN

## 2024-10-13 NOTE — Progress Notes (Signed)
 Eligard  SubQ Injection   Due to Prostate Cancer patient is present today for a Eligard  Injection. Order reviewed. Prior Authorization reviewed.  Medication: Eligard  6 month Dose: 45 mg  Location: right lower abdomin  site prepped and cleaned with alcohol prior to giving injection.  Lot: [id number] Exp: 10/2025  Patient tolerated well, no complications were noted  Performed by: Exie T. CMA  Per Dr. Nieves patient is to continue therapy for 6 months . A reminder continue on Vitamin D  800-1000iu and Calcium  1000-1200mg  daily while on Androgen Deprivation Therapy.  PA approval dates:

## 2024-10-13 NOTE — Progress Notes (Signed)
 10/13/2024 10:11 AM   [name] [birth year] [id number]  Referring provider: Shona Norleen PEDLAR, MD [address] [place],  [place] [address]  No chief complaint on file.   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  Gleason score 4+5= 9. First-line: Long-term ADT plus definitive EBRT 08/2022 with elevated PSA, PSMA PET showed recurrence of the prostate. Started on darolutamide  and had seizures on it.  Changed to abiraterone  and prednisone .  Caris assure: TMB indeterminate and MSI indeterminate, tumor fraction low.   2)OAB -on Gemtesa .  He tried Myrbetriq .  3) gross hematuria-CT benign in 2023.  Cystoscopy benign June 2023.  Also in 2023, He had endoscopy on Monday for rectal bleeding from radiation cystitis and had some fulguration.   Today, seen for the above.  He remains on Abi/pred. Due for Eligard . Nov 2025 PSA < 0.02. Cr 1.41. Nov 2025 DEXA - Normal based on BMD. No increased risk of fracture.   New patient for me.  I reviewed his chart, notes from Dr. Watt, Dr. Jillian.  Labs, imaging.  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     PMH: Past Medical History:  Diagnosis Date   Arthritis    Cerebrovascular disease    MRI shows Right carotid inferior cavernours narrowing 75% and  50-75% stenosis of Cavernous and supraclinoi right side   CKD (chronic kidney disease) 06/28/2014   Sees Dr Perri   Diabetic retinopathy [facility])    Hyperlipidemia    Hypertension    Myocardial infarction [facility])    mild heart attack   Necrosis (HCC)    #2 nail    Pneumonia    as a child   Poor circulation of extremity    Prostate cancer (HCC) 2017   Prostate   Sleep apnea    uses cpap, getting a new one   Type 2 Diabetes mellitus    Type 2    Surgical History: Past Surgical History:  Procedure Laterality Date   ABOVE KNEE LEG AMPUTATION Left 2004   started out below knee and then  extended to above knee due to poor healing   AMPUTATION Right 04/28/2015   Procedure: AMPUTATION RAY, RIGHT 5TH TOE;  Surgeon: Oneil JAYSON Herald, MD;  Location: [facility] OR;  Service: Orthopedics;  Laterality: Right;   AMPUTATION Right 05/10/2015   Procedure: Right Below Knee Amputation;  Surgeon: Oneil JAYSON Herald, MD;  Location: [facility] OR;  Service: Orthopedics;  Laterality: Right;   CATARACT EXTRACTION W/PHACO  09/05/2012   Procedure: CATARACT EXTRACTION PHACO AND INTRAOCULAR LENS PLACEMENT (IOC);  Surgeon: Cherene Mania, MD;  Location: [facility] ORS;  Service: Ophthalmology;  Laterality: Left;  CDE=5.45   CATARACT EXTRACTION W/PHACO  10/03/2012   Procedure: CATARACT EXTRACTION PHACO AND INTRAOCULAR LENS PLACEMENT (IOC);  Surgeon: Cherene Mania, MD;  Location: [facility] ORS;  Service: Ophthalmology;  Laterality: Right;  CDE: 12.31   COLONOSCOPY  2011   Dr. Debrah: colon polyps, tubular adenoma   COLONOSCOPY N/A 11/01/2018   Dr. Shaaron: Blood noted in the rectal vault.  Vascular pattern of the rectum abnormal, neovascular changes distally and actively oozing.  There were 3 2 to 5 mm polyps in the splenic flexure, cecum removed.  Cecal polyp was not recovered.  Radiation proctitis status post APC treatment.  The splenic flexure polyps were tubular adenomas.  Next colonoscopy in 5 years.   COLONOSCOPY WITH  PROPOFOL  N/A 01/28/2024   Procedure: COLONOSCOPY WITH PROPOFOL ;  Surgeon: Cindie Carlin POUR, DO;  Location: [facility];  Service: Endoscopy;  Laterality: N/A;  7:30AM, ASA 3   FLEXIBLE SIGMOIDOSCOPY N/A 10/27/2019   Procedure: FLEXIBLE SIGMOIDOSCOPY;  Surgeon: Harvey Margo CROME, MD; rectal bleeding due to radiation proctitis s/p APC therapy (actively bleeding during exam), rectosigmoid colon and sigmoid colon appeared normal.   FLEXIBLE SIGMOIDOSCOPY N/A 03/13/2022   Procedure: FLEXIBLE SIGMOIDOSCOPY;  Surgeon: Cindie Carlin POUR, DO;  [facility];  Service: Endoscopy;  Laterality: N/A;  1:45pm   HOT HEMOSTASIS  03/13/2022    Procedure: HOT HEMOSTASIS (ARGON PLASMA COAGULATION/BICAP);  Surgeon: Cindie Carlin POUR, DO;  Location: [facility];  Service: Endoscopy;;   POLYPECTOMY  11/01/2018   Procedure: POLYPECTOMY;  Surgeon: Shaaron Lamar HERO, MD;  [facility];  Service: Endoscopy;;  cecum,splenic flexure   POLYPECTOMY  01/28/2024   Procedure: POLYPECTOMY;  Surgeon: Cindie Carlin POUR, DO;  Location: [facility];  Service: Endoscopy;;   REFRACTIVE SURGERY Left 08/01/2021   Cleaned cataract    Home Medications:  Allergies as of 10/13/2024       Reactions   Zolpidem Tartrate Other (See Comments)   disorientation        Medication List        Accurate as of October 13, 2024 10:11 AM. If you have any questions, ask your nurse or doctor.          abiraterone  acetate 250 MG tablet Commonly known as: ZYTIGA  Take 4 tablets (1,000 mg total) by mouth daily. Take on an empty stomach 1 hour before or 2 hours after a meal   Accu-Chek Guide Test test strip Generic drug: glucose blood Use 1 test strip to check blood sugar twice a day as directed by provider.   acetaminophen  325 MG tablet Commonly known as: TYLENOL  Take 650 mg by mouth daily as needed for mild pain, moderate pain or headache.   atenolol  25 MG tablet Commonly known as: TENORMIN  TAKE 1 TABLET BY MOUTH EVERY DAY   atorvastatin  20 MG tablet Commonly known as: LIPITOR Take 20 mg by mouth daily.   B-D UF III MINI PEN NEEDLES 31G X 5 MM Misc Generic drug: Insulin  Pen Needle USE FOUR TIMES DAILY AS DIRECTED   Calcium  Carb-Cholecalciferol 600-500 MG-UNIT Caps Take 2 capsules by mouth daily.   clopidogrel  75 MG tablet Commonly known as: PLAVIX  TAKE 1 TABLET BY MOUTH EVERY DAY   furosemide  20 MG tablet Commonly known as: LASIX  Take 20 mg by mouth 3 (three) times a week.   Gemtesa  75 MG Tabs Generic drug: Vibegron  Take 1 tablet (75 mg total) by mouth daily.   Gemtesa  75 MG Tabs Generic drug: Vibegron  Take 1 tablet (75  mg total) by mouth daily.   Gemtesa  75 MG Tabs Generic drug: Vibegron  Take 1 tablet (75 mg total) by mouth daily.   latanoprost 0.005 % ophthalmic solution Commonly known as: XALATAN Place 1 drop into both eyes at bedtime.   levETIRAcetam  250 MG tablet Commonly known as: KEPPRA  TAKE 1 TABLET BY MOUTH TWICE A DAY   predniSONE  5 MG tablet Commonly known as: DELTASONE  TAKE 1 TABLET BY MOUTH EVERY DAY WITH BREAKFAST   prochlorperazine  10 MG tablet Commonly known as: COMPAZINE  Take 1 tablet (10 mg total) by mouth every 6 (six) hours as needed for nausea or vomiting.   Rocklatan 0.02-0.005 % Soln Generic drug: Netarsudil-Latanoprost   Semaglutide  (1 MG/DOSE) 4 MG/3ML  Sopn Inject 1 mg as directed once a week.   Tresiba  FlexTouch 100 UNIT/ML FlexTouch Pen Generic drug: insulin  degludec INJECT 20 UNITS INTO THE SKIN AT BEDTIME. What changed: how much to take        Allergies: Allergies[1]  Family History: Family History  Problem Relation Age of Onset   Diabetes Mother    Hypertension Mother    Stroke Mother    Lung cancer Father 60   Breast cancer Sister 21   Stroke Sister    Stroke Sister    Prostate cancer Maternal Grandfather    Stroke Maternal Grandfather    Sickle cell anemia Daughter    Prostate cancer Cousin    Prostate cancer Cousin    Colon cancer Neg Hx    Seizures Neg Hx     Social History:  reports that he has never smoked. He has never been exposed to tobacco smoke. He has never used smokeless tobacco. He reports that he does not drink alcohol and does not use drugs.   Physical Exam: There were no vitals taken for this visit.  Constitutional:  Alert and oriented, No acute distress. HEENT: [place] AT, moist mucus membranes.  Trachea midline, no masses. Cardiovascular: No clubbing, cyanosis, or edema. Respiratory: Normal respiratory effort, no increased work of breathing. GI: Abdomen is soft, nontender, nondistended, no abdominal masses GU: No CVA  tenderness Skin: No rashes, bruises or suspicious lesions. Neurologic: Grossly intact, no focal deficits, moving all 4 extremities. Psychiatric: Normal mood and affect.  Laboratory Data: Lab Results  Component Value Date   WBC 6.6 09/15/2024   HGB 10.9 (L) 09/15/2024   HCT 33.5 (L) 09/15/2024   MCV 94.6 09/15/2024   PLT 215 09/15/2024    Lab Results  Component Value Date   CREATININE 1.41 (H) 09/15/2024    Lab Results  Component Value Date   PSA <0.1 03/16/2020   PSA 6.8 (H) 01/03/2017    Lab Results  Component Value Date   TESTOSTERONE  <3 (L) 01/21/2024    Lab Results  Component Value Date   HGBA1C 7.4 (A) 04/17/2024    Urinalysis    Component Value Date/Time   COLORURINE STRAW (A) 09/19/2023 2002   APPEARANCEUR Clear 10/11/2023 1122   LABSPEC 1.010 09/19/2023 2002   PHURINE 5.0 09/19/2023 2002   GLUCOSEU Negative 10/11/2023 1122   HGBUR NEGATIVE 09/19/2023 2002   BILIRUBINUR Negative 10/11/2023 1122   KETONESUR NEGATIVE 09/19/2023 2002   PROTEINUR Negative 10/11/2023 1122   PROTEINUR NEGATIVE 09/19/2023 2002   UROBILINOGEN 0.2 04/12/2015 0224   NITRITE Negative 10/11/2023 1122   NITRITE NEGATIVE 09/19/2023 2002   LEUKOCYTESUR Negative 10/11/2023 1122   LEUKOCYTESUR NEGATIVE 09/19/2023 2002    Lab Results  Component Value Date   LABMICR Comment 10/11/2023   WBCUA >30 (A) 12/28/2022   LABEPIT 0-10 12/28/2022   MUCUS Present 03/16/2022   BACTERIA Many (A) 12/28/2022    Pertinent Imaging:  Results for orders placed during the hospital encounter of 10/31/13  US  Renal  Narrative CLINICAL DATA:  Elevated serum creatinine. Hypertension and diabetes.  EXAM: RENAL/URINARY TRACT ULTRASOUND COMPLETE  COMPARISON:  08/22/2006  FINDINGS: Right Kidney: 10.1 cm. Slightly increased echogenicity and mild renal cortical thinning. No hydronephrosis.  Left Kidney: 10.5 cm. Slightly increased echogenicity with mild renal cortical thinning. No  hydronephrosis.  Bladder:  Within normal limits.  Bilateral ureteric jets identified.  IMPRESSION: 1.  No hydronephrosis. 2. Findings of medical renal disease, including increased echogenicity and mildly decreased cortical thickness.  Electronically Signed By: Rockey Kilts M.D. On: 10/31/2013 15:06  No results found for this or any previous visit.  Results for orders placed during the hospital encounter of 03/25/22  CT HEMATURIA WORKUP  Narrative CLINICAL DATA:  Microscopic hematuria.  EXAM: CT ABDOMEN AND PELVIS WITHOUT AND WITH CONTRAST  TECHNIQUE: Multidetector CT imaging of the abdomen and pelvis was performed following the standard protocol before and following the bolus administration of intravenous contrast.  RADIATION DOSE REDUCTION: This exam was performed according to the departmental dose-optimization program which includes automated exposure control, adjustment of the mA and/or kV according to patient size and/or use of iterative reconstruction technique.  CONTRAST:  80mL OMNIPAQUE  IOHEXOL  300 MG/ML  SOLN  COMPARISON:  None Available.  FINDINGS: Lower Chest: No acute findings. Chronic interstitial lung disease noted in both bases.  Hepatobiliary: No hepatic masses identified. Tiny cyst noted in the left hepatic lobe. A sub-cm calcified gallstone is seen, however there is no evidence of cholecystitis or biliary ductal dilatation.  Pancreas:  No mass or inflammatory changes.  Spleen: Within normal limits in size and appearance.  Adrenals/Urinary Tract: No adrenal masses identified. No evidence of urolithiasis or hydronephrosis. No complex cystic or solid renal masses identified. No masses seen involving the collecting systems, ureters, or bladder. Mild diffuse bladder wall thickening is seen which may be due to chronic bladder outlet obstruction or post radiation changes given history of prostate carcinoma.  Stomach/Bowel: No evidence of  obstruction, inflammatory process or abnormal fluid collections. Normal appendix visualized.  Vascular/Lymphatic: No pathologically enlarged lymph nodes. No acute vascular findings. Aortic atherosclerotic calcification incidentally noted.  Reproductive:  Very small prostate versus prior prostatectomy.  Other:  None.  Musculoskeletal:  No suspicious bone lesions identified.  IMPRESSION: No radiographic evidence of urinary tract neoplasm, urolithiasis, or hydronephrosis.  Mild diffuse bladder wall thickening, which may be due to chronic bladder outlet obstruction or post radiation changes given history of prostate carcinoma.  Cholelithiasis. No radiographic evidence of cholecystitis.  Aortic Atherosclerosis (ICD10-I70.0).   Electronically Signed By: Norleen DELENA Kil M.D. On: 03/28/2022 08:24  No results found for this or any previous visit.   Assessment & Plan:    1) OAB - continue gemtesa   2) PCa - he was given Eligard  45 mg today.   No follow-ups on file.  Donnice Brooks, MD  [facility]  [address] [place], [place] [address] [phone]      [1]  Allergies Allergen Reactions   Zolpidem Tartrate Other (See Comments)    disorientation

## 2024-10-14 ENCOUNTER — Other Ambulatory Visit: Payer: Self-pay

## 2024-10-16 ENCOUNTER — Other Ambulatory Visit: Payer: Self-pay

## 2024-10-16 NOTE — Progress Notes (Signed)
 Specialty Pharmacy Refill Coordination Note  [name] is a 73 y.o. male contacted today regarding refills of specialty medication(s) Abiraterone  Acetate (ZYTIGA )   Patient requested (Patient-Rptd) Delivery   Delivery date: 10/20/24   Verified address: (Patient-Rptd) [address] [place], [place] [address]   Medication will be filled on: 10/17/24

## 2024-10-17 ENCOUNTER — Other Ambulatory Visit: Payer: Self-pay

## 2024-10-20 ENCOUNTER — Other Ambulatory Visit: Payer: Self-pay | Admitting: *Deleted

## 2024-10-20 MED ORDER — PREDNISONE 5 MG PO TABS: 5.0000 mg | ORAL_TABLET | Freq: Every day | ORAL | 5 refills | Status: AC

## 2024-10-27 ENCOUNTER — Other Ambulatory Visit: Payer: Self-pay

## 2024-10-27 ENCOUNTER — Encounter: Payer: Self-pay | Admitting: *Deleted

## 2024-10-27 NOTE — Progress Notes (Signed)
 Specialty Pharmacy Ongoing Clinical Assessment Note  KAO CONRY is a 73 y.o. male who is being followed by the specialty pharmacy service for RxSp Oncology   Patient's specialty medication(s) reviewed today: Abiraterone  Acetate (ZYTIGA )   Missed doses in the last 4 weeks: 0   Patient/Caregiver did not have any additional questions or concerns.   Therapeutic benefit summary: Patient is achieving benefit   Adverse events/side effects summary: No adverse events/side effects   Patient's therapy is appropriate to: Continue    Goals Addressed             This Visit's Progress    Slow Disease Progression   On track    Patient is on track. Patient will maintain adherence. PSA <0.02 ng/mL on 09/15/24.          Follow up: 6 months  Silvano LOISE Dolly Specialty Pharmacist

## 2024-10-31 ENCOUNTER — Other Ambulatory Visit: Payer: Self-pay

## 2024-10-31 NOTE — Progress Notes (Signed)
 Specialty Pharmacy Refill Coordination Note  [name] is a 74 y.o. male contacted today regarding refills of specialty medication(s) Abiraterone  Acetate (ZYTIGA )   Patient requested Delivery   Delivery date: 11/06/24   Verified address: [address] [place], [place] [address]   Medication will be filled on: 11/05/24

## 2024-11-28 ENCOUNTER — Other Ambulatory Visit (HOSPITAL_COMMUNITY): Payer: Self-pay

## 2024-11-28 ENCOUNTER — Other Ambulatory Visit: Payer: Self-pay | Admitting: Oncology

## 2024-11-28 DIAGNOSIS — C61 Malignant neoplasm of prostate: Secondary | ICD-10-CM

## 2024-12-01 ENCOUNTER — Other Ambulatory Visit: Payer: Self-pay

## 2024-12-01 ENCOUNTER — Encounter: Payer: Self-pay | Admitting: Oncology

## 2024-12-01 MED ORDER — ABIRATERONE ACETATE 250 MG PO TABS
1000.0000 mg | ORAL_TABLET | Freq: Every day | ORAL | 2 refills | Status: AC
  Filled 2024-12-01 – 2024-12-05 (×2): qty 120, 30d supply, fill #0

## 2024-12-01 NOTE — Telephone Encounter (Signed)
 Chart reviewed. Zytiga  refilled per last office note with Dr. Davonna.

## 2024-12-05 ENCOUNTER — Other Ambulatory Visit: Payer: Self-pay

## 2024-12-12 ENCOUNTER — Inpatient Hospital Stay: Attending: Hematology

## 2024-12-19 ENCOUNTER — Inpatient Hospital Stay: Admitting: Oncology

## 2024-12-23 ENCOUNTER — Ambulatory Visit: Admitting: Nurse Practitioner

## 2025-04-13 ENCOUNTER — Ambulatory Visit: Admitting: Urology
